# Patient Record
Sex: Male | Born: 1960 | Race: White | Hispanic: No | Marital: Married | Smoking: Former smoker
Health system: Southern US, Community
[De-identification: ages and names within clinical notes are randomized; demographics above are authoritative.]

## PROBLEM LIST (undated history)

## (undated) DIAGNOSIS — Z6841 Body Mass Index (BMI) 40.0 and over, adult: Secondary | ICD-10-CM

## (undated) DIAGNOSIS — K219 Gastro-esophageal reflux disease without esophagitis: Secondary | ICD-10-CM

## (undated) DIAGNOSIS — E119 Type 2 diabetes mellitus without complications: Secondary | ICD-10-CM

## (undated) DIAGNOSIS — D510 Vitamin B12 deficiency anemia due to intrinsic factor deficiency: Secondary | ICD-10-CM

## (undated) DIAGNOSIS — I499 Cardiac arrhythmia, unspecified: Secondary | ICD-10-CM

## (undated) DIAGNOSIS — I4891 Unspecified atrial fibrillation: Secondary | ICD-10-CM

## (undated) DIAGNOSIS — M86272 Subacute osteomyelitis, left ankle and foot: Secondary | ICD-10-CM

## (undated) DIAGNOSIS — J189 Pneumonia, unspecified organism: Secondary | ICD-10-CM

## (undated) DIAGNOSIS — S9305XA Dislocation of left ankle joint, initial encounter: Secondary | ICD-10-CM

## (undated) DIAGNOSIS — L02419 Cutaneous abscess of limb, unspecified: Secondary | ICD-10-CM

## (undated) DIAGNOSIS — S53105A Unspecified dislocation of left ulnohumeral joint, initial encounter: Secondary | ICD-10-CM

## (undated) DIAGNOSIS — G500 Trigeminal neuralgia: Secondary | ICD-10-CM

## (undated) DIAGNOSIS — G894 Chronic pain syndrome: Secondary | ICD-10-CM

## (undated) DIAGNOSIS — K589 Irritable bowel syndrome without diarrhea: Secondary | ICD-10-CM

## (undated) DIAGNOSIS — I4821 Permanent atrial fibrillation: Secondary | ICD-10-CM

## (undated) DIAGNOSIS — M545 Low back pain: Secondary | ICD-10-CM

## (undated) DIAGNOSIS — J45909 Unspecified asthma, uncomplicated: Secondary | ICD-10-CM

## (undated) DIAGNOSIS — E1140 Type 2 diabetes mellitus with diabetic neuropathy, unspecified: Secondary | ICD-10-CM

## (undated) DIAGNOSIS — S91002A Unspecified open wound, left ankle, initial encounter: Secondary | ICD-10-CM

## (undated) DIAGNOSIS — F32A Depression, unspecified: Secondary | ICD-10-CM

## (undated) DIAGNOSIS — F419 Anxiety disorder, unspecified: Secondary | ICD-10-CM

## (undated) DIAGNOSIS — Z91030 Bee allergy status: Secondary | ICD-10-CM

## (undated) DIAGNOSIS — E662 Morbid (severe) obesity with alveolar hypoventilation: Secondary | ICD-10-CM

## (undated) DIAGNOSIS — I509 Heart failure, unspecified: Secondary | ICD-10-CM

## (undated) DIAGNOSIS — Z9109 Other allergy status, other than to drugs and biological substances: Secondary | ICD-10-CM

## (undated) DIAGNOSIS — M79605 Pain in left leg: Secondary | ICD-10-CM

## (undated) DIAGNOSIS — G510 Bell's palsy: Secondary | ICD-10-CM

## (undated) DIAGNOSIS — M1990 Unspecified osteoarthritis, unspecified site: Secondary | ICD-10-CM

## (undated) DIAGNOSIS — G4733 Obstructive sleep apnea (adult) (pediatric): Secondary | ICD-10-CM

## (undated) DIAGNOSIS — I872 Venous insufficiency (chronic) (peripheral): Secondary | ICD-10-CM

## (undated) DIAGNOSIS — E782 Mixed hyperlipidemia: Secondary | ICD-10-CM

## (undated) DIAGNOSIS — I10 Essential (primary) hypertension: Secondary | ICD-10-CM

## (undated) DIAGNOSIS — Z87442 Personal history of urinary calculi: Secondary | ICD-10-CM

## (undated) DIAGNOSIS — R609 Edema, unspecified: Secondary | ICD-10-CM

## (undated) DIAGNOSIS — G542 Cervical root disorders, not elsewhere classified: Secondary | ICD-10-CM

## (undated) DIAGNOSIS — E1142 Type 2 diabetes mellitus with diabetic polyneuropathy: Secondary | ICD-10-CM

## (undated) HISTORY — DX: Subacute osteomyelitis, left ankle and foot: M86.272

## (undated) HISTORY — DX: Morbid (severe) obesity with alveolar hypoventilation: E66.2

## (undated) HISTORY — DX: Unspecified open wound, left ankle, initial encounter: S93.05XA

## (undated) HISTORY — DX: Unspecified osteoarthritis, unspecified site: M19.90

## (undated) HISTORY — DX: Bee allergy status: Z91.030

## (undated) HISTORY — DX: Anxiety disorder, unspecified: F41.9

## (undated) HISTORY — DX: Body Mass Index (BMI) 40.0 and over, adult: Z684

## (undated) HISTORY — DX: Venous insufficiency (chronic) (peripheral): I87.2

## (undated) HISTORY — DX: Mixed hyperlipidemia: E78.2

## (undated) HISTORY — DX: Type 2 diabetes mellitus without complications: E11.9

## (undated) HISTORY — DX: Permanent atrial fibrillation: I48.21

## (undated) HISTORY — DX: Obstructive sleep apnea (adult) (pediatric): G47.33

## (undated) HISTORY — DX: Low back pain: M54.5

## (undated) HISTORY — DX: Dislocation of left ankle joint, initial encounter: S91.002A

## (undated) HISTORY — DX: Essential (primary) hypertension: I10

## (undated) HISTORY — DX: Cutaneous abscess of limb, unspecified: L02.419

## (undated) HISTORY — DX: Other allergy status, other than to drugs and biological substances: Z91.09

## (undated) HISTORY — PX: TONSILLECTOMY: SUR1361

## (undated) HISTORY — DX: Type 2 diabetes mellitus with diabetic neuropathy, unspecified: E11.40

## (undated) HISTORY — PX: BACK SURGERY: SHX140

## (undated) HISTORY — DX: Chronic pain syndrome: G89.4

## (undated) HISTORY — DX: Cervical root disorders, not elsewhere classified: G54.2

## (undated) HISTORY — DX: Type 2 diabetes mellitus with diabetic polyneuropathy: E11.42

## (undated) HISTORY — DX: Pain in left leg: M79.605

## (undated) HISTORY — DX: Vitamin B12 deficiency anemia due to intrinsic factor deficiency: D51.0

## (undated) HISTORY — DX: Trigeminal neuralgia: G50.0

## (undated) HISTORY — DX: Unspecified dislocation of left ulnohumeral joint, initial encounter: S53.105A

## (undated) HISTORY — PX: CARPAL TUNNEL RELEASE: SHX101

## (undated) HISTORY — PX: ADENOIDECTOMY: SUR15

## (undated) HISTORY — DX: Morbid (severe) obesity due to excess calories: E66.01

## (undated) HISTORY — DX: Bell's palsy: G51.0

## (undated) HISTORY — DX: Edema, unspecified: R60.9

---

## 1996-02-05 HISTORY — PX: SHOULDER SURGERY: SHX246

## 2001-01-20 ENCOUNTER — Encounter: Payer: Self-pay | Admitting: Neurosurgery

## 2001-01-20 ENCOUNTER — Encounter: Admission: RE | Admit: 2001-01-20 | Discharge: 2001-01-20 | Payer: Self-pay | Admitting: Neurosurgery

## 2001-02-19 ENCOUNTER — Encounter: Payer: Self-pay | Admitting: Neurosurgery

## 2001-02-19 ENCOUNTER — Inpatient Hospital Stay (HOSPITAL_COMMUNITY): Admission: RE | Admit: 2001-02-19 | Discharge: 2001-02-20 | Payer: Self-pay | Admitting: Neurosurgery

## 2001-04-07 ENCOUNTER — Encounter: Payer: Self-pay | Admitting: Neurosurgery

## 2001-04-07 ENCOUNTER — Ambulatory Visit (HOSPITAL_COMMUNITY): Admission: RE | Admit: 2001-04-07 | Discharge: 2001-04-07 | Payer: Self-pay | Admitting: Neurosurgery

## 2001-05-12 ENCOUNTER — Encounter: Payer: Self-pay | Admitting: Neurosurgery

## 2001-05-12 ENCOUNTER — Encounter: Admission: RE | Admit: 2001-05-12 | Discharge: 2001-05-12 | Payer: Self-pay | Admitting: Neurosurgery

## 2001-07-15 ENCOUNTER — Encounter: Payer: Self-pay | Admitting: Neurosurgery

## 2001-07-15 ENCOUNTER — Ambulatory Visit (HOSPITAL_COMMUNITY): Admission: RE | Admit: 2001-07-15 | Discharge: 2001-07-15 | Payer: Self-pay | Admitting: Neurosurgery

## 2002-05-28 ENCOUNTER — Encounter
Admission: RE | Admit: 2002-05-28 | Discharge: 2002-05-28 | Payer: Self-pay | Admitting: Physical Medicine and Rehabilitation

## 2002-08-25 ENCOUNTER — Encounter: Payer: Self-pay | Admitting: Neurosurgery

## 2002-08-27 ENCOUNTER — Inpatient Hospital Stay (HOSPITAL_COMMUNITY): Admission: RE | Admit: 2002-08-27 | Discharge: 2002-08-28 | Payer: Self-pay | Admitting: Neurosurgery

## 2002-08-27 ENCOUNTER — Encounter: Payer: Self-pay | Admitting: Neurosurgery

## 2002-10-13 ENCOUNTER — Encounter: Admission: RE | Admit: 2002-10-13 | Discharge: 2002-10-13 | Payer: Self-pay | Admitting: Neurosurgery

## 2002-10-13 ENCOUNTER — Encounter: Payer: Self-pay | Admitting: Neurosurgery

## 2002-11-29 ENCOUNTER — Encounter: Admission: RE | Admit: 2002-11-29 | Discharge: 2002-11-29 | Payer: Self-pay | Admitting: Neurosurgery

## 2002-11-29 ENCOUNTER — Encounter: Payer: Self-pay | Admitting: Neurosurgery

## 2003-02-05 HISTORY — PX: KNEE SURGERY: SHX244

## 2007-03-02 ENCOUNTER — Ambulatory Visit (HOSPITAL_COMMUNITY)
Admission: RE | Admit: 2007-03-02 | Discharge: 2007-03-02 | Payer: Self-pay | Admitting: Physical Medicine and Rehabilitation

## 2007-03-02 ENCOUNTER — Encounter (INDEPENDENT_AMBULATORY_CARE_PROVIDER_SITE_OTHER): Payer: Self-pay | Admitting: Physical Medicine and Rehabilitation

## 2007-06-30 ENCOUNTER — Ambulatory Visit (HOSPITAL_BASED_OUTPATIENT_CLINIC_OR_DEPARTMENT_OTHER): Admission: RE | Admit: 2007-06-30 | Discharge: 2007-06-30 | Payer: Self-pay | Admitting: Neurosurgery

## 2007-07-30 ENCOUNTER — Ambulatory Visit (HOSPITAL_BASED_OUTPATIENT_CLINIC_OR_DEPARTMENT_OTHER): Admission: RE | Admit: 2007-07-30 | Discharge: 2007-07-30 | Payer: Self-pay | Admitting: Neurosurgery

## 2007-08-16 ENCOUNTER — Encounter: Admission: RE | Admit: 2007-08-16 | Discharge: 2007-08-16 | Payer: Self-pay | Admitting: Neurology

## 2008-01-25 ENCOUNTER — Encounter
Admission: RE | Admit: 2008-01-25 | Discharge: 2008-01-25 | Payer: Self-pay | Admitting: Physical Medicine and Rehabilitation

## 2010-06-19 NOTE — Op Note (Signed)
NAMECOLLAN, SCHOENFELD                ACCOUNT NO.:  [id number]   MEDICAL RECORD NO.:  [id number]          PATIENT TYPE:  AMB   LOCATION:  DSC                          FACILITY:  [facility]   PHYSICIAN:  Reinaldo Meeker, M.D. DATE OF BIRTH:  07/10/1960   DATE OF PROCEDURE:  06/30/2007  DATE OF DISCHARGE:                               OPERATIVE REPORT   PREOPERATIVE DIAGNOSIS:  Right carpal tunnel syndrome.   POSTOPERATIVE DIAGNOSIS:  Right carpal tunnel syndrome.   PROCEDURE:  Right carpal tunnel release.   SURGEON:  Reinaldo Meeker, MD   ANESTHESIA:  Regional.   PROCEDURE IN DETAIL:  After induction of regional anesthetic, the  patient's right hand, wrist, and forearm were prepped and draped in  usual sterile fashion.  A curvilinear incision was made in the right  palm, started at the wrist and along with the ring finger heading up  into the palm and then in a radial direction.  Subcutaneous fat was  incised and a self-retaining retractor was placed for exposure.  Dissection was carried down to the transverse carpal ligament, which was  easily identifiable.  This was then sectioned from a proximal to distal  direction, decompressed the underlying nerve.  The inspection was then  carried out once more for any evidence of residual compression, none  could be identified.  Irrigation was then carried out, and the wound  closed with interrupted Vicryl on the subcutaneous tissue and  interrupted nylon on the skin.  A bulky dressing was then applied and  the patient then taken to recovery room in stable condition.           ______________________________  Reinaldo Meeker, M.D.     ROK/MEDQ  D:  06/30/2007  T:  06/30/2007  Job:  [id number]

## 2010-06-19 NOTE — Op Note (Signed)
NAMEHANNAH, CRILL                ACCOUNT NO.:  [id number]   MEDICAL RECORD NO.:  [id number]          PATIENT TYPE:  AMB   LOCATION:  DSC                          FACILITY:  [facility]   PHYSICIAN:  Reinaldo Meeker, M.D. DATE OF BIRTH:  1960/05/27   DATE OF PROCEDURE:  07/30/2007  DATE OF DISCHARGE:                               OPERATIVE REPORT   PREOPERATIVE DIAGNOSIS:  Left carpal tunnel syndrome.   POSTOPERATIVE DIAGNOSIS:  Left carpal tunnel syndrome.   PROCEDURE:  Left carpal tunnel release.   SURGEON:  Reinaldo Meeker, MD.   PROCEDURE IN DETAIL:  After induction of regional anesthetic, the  patient's forearm, wrist, and hand were prepped and draped in the usual  sterile fashion.  Curvilinear incision was made in the left palm  starting at the wrist in line with the ring finger heading up into the  palm and then heading to slightly radial direction.  Subcutaneous fat  was divided and self-retaining retractor was placed for exposure.  The  transverse carpal ligament was found to be tremendously hypertrophied  and this was incised from a proximal to a distal direction to decompress  the underlying median nerve, which was well visualized.  A thorough  decompression was carried out into the distal wrist and up into the mid  palm.  At this time, inspection was carried out in all directions for  any evidence of residual compression, none could be identified.  Irrigation was carried out and the wound closed with interrupted Vicryl  in the subcutaneous tissue and interrupted nylon on the skin.  A bulky  sterile dressing was applied on the Ace wrap and the patient was taken  to recovery room in stable condition.           ______________________________  Reinaldo Meeker, M.D.     ROK/MEDQ  D:  07/30/2007  T:  07/30/2007  Job:  [id number]

## 2010-06-22 NOTE — Op Note (Signed)
[place]  Patient:    ZACHARIE, PORTNER Visit Number: [id number] MRN: [id number]          Service Type: DSU Location: [id number] [phone] Attending Physician:  Gerald Dexter Dictated by:   Reinaldo Meeker, M.D. Proc. Date: 02/19/01 Admit Date:  02/19/2001                             Operative Report  PREOPERATIVE DIAGNOSIS:  Herniated disk at L4-5 central.  POSTOPERATIVE DIAGNOSIS:  Herniated disk at L4-5 central.  PROCEDURES: 1. Bilateral L4-5 laminectomy, followed by bilateral microdiskectomy, followed    by Ray Cage interbody fusion with local bone graft. 2. Microdissection L4-5 disk and L5 nerve roots bilaterally.  SURGEON:  Reinaldo Meeker, M.D.  ASSISTANT:  Donalee Citrin, Montez Hageman., M.D.  DESCRIPTION OF PROCEDURE:  After being placed in the prone position, the patients back was prepped and draped in the usual sterile fashion. Fluoroscopy was used prior to incision to identify the appropriate level.  A midline incision made above the spinous processes of L4 and L5.  Using Bovie cutting current, the incision was carried down to the spinous processes. Subperiosteal dissection was then carried out bilaterally on the spinous processes and laminae, and the McCullough self-retaining retractor was placed for exposure.  Fluoroscopy confirmed approach to the appropriate levels.  The spinous processes and interspinous ligament were removed with the Stille rongeur.  Starting on the patients left side, previous laminotomy was identified and enlarged by removing the inferior two-thirds of the L4 lamina and the medial two-thirds of the facet joint and the superior edge of the L5 lamina, which was generally already removed.  Residual bone was removed and saved for use in the cages at the end of the case, and ligamentum flavum and scar tissue were removed in a piecemeal fashion.  Similar decompression was carried out on the patients right side, once again  removing the inferior two-thirds of the L4 lamina, the medial two-thirds of the facet joint, and the superior one-half of the L5 segment.  Once again residual bone was removed and saved for use in the cages.  Ligamentum flavum and midline structures were removed to complete the bilateral decompression.  Nerve roots were identified and followed out their foramina bilaterally until decompression was confirmed. At this point the microscope was draped and brought in the field and used for the remainder of the case.  Using microdissection technique, the lateral aspect of the thecal sac and L5 nerve root was identified on the right.  Using bipolar coagulation, the annulus of the disk was incised and using pituitary rongeurs and curettes, it was thoroughly cleaned out.  Similar decompression was carried out on the patients left side, once again identifying the L5 nerve root, the disk space incised with the annulus and cleaning it thoroughly with the pituitary rongeurs and curettes.  At this point the disk was found to be well cleaned out.  Ray Cages 14 x 26 mm were then chosen.  Under fluoroscopic guidance starting on the patients right side, the Tang retractor was placed, followed by cutting, tapping, and placement of the 14 x 6 Ray Cage into excellent position.  The Tang retractor was removed.  There was no evidence of any injury to the nerve root on that side.  A similar procedure was carried out on the patients left side, once again placing the Tang retractor, cutting, tapping, placing the cage  under fluoroscopic guidance without difficulty.  Fluoroscopy in the AP and lateral position showed the cages to be in excellent position.  Large amounts of irrigation were carried out at this time.  Any bleeding was controlled with bipolar coagulation.  Bone from the laminotomy was packed within the cages and the caps placed without difficulty.  The wound was irrigated once more and any bleeding  controlled with bipolar coagulation and Gelfoam.  A medium Hemovac drain was brought in through a separate stab wound incision and secured to the skin with a Vicryl stitch.  The wound was then closed using interrupted Vicryl in the muscle, fascia, subcutaneous, and subcuticular tissues, and Dermabond and Steri-Strips on the skin.  A sterile dressing was then applied, and the patient was extubated and taken to the recovery room in stable condition. Dictated by:   Reinaldo Meeker, M.D. Attending Physician:  Gerald Dexter DD:  02/19/01 TD:  02/19/01 Job: [id number]

## 2010-06-22 NOTE — Op Note (Signed)
David Fitzgerald, David Fitzgerald                            ACCOUNT NO.:  [id number]   MEDICAL RECORD NO.:  [id number]                   PATIENT TYPE:  INP   LOCATION:  3005                                 FACILITY:  [facility]   PHYSICIAN:  Reinaldo Meeker, M.D.              DATE OF BIRTH:  10/26/1960   DATE OF PROCEDURE:  08/27/2002  DATE OF DISCHARGE:                                 OPERATIVE REPORT   PREOPERATIVE DIAGNOSIS:  Herniated disk and degenerative disk disease L5-S1.   POSTOPERATIVE DIAGNOSIS:  Herniated disk and degenerative disk disease L5-  S1.   PROCEDURE:  Bilateral L5-S1 decompressive laminectomy followed  by posterior  lumbar interbody fusion with Tangent bone spacers and autologous bone graft.   SURGEON:  Reinaldo Meeker, M.D.   ASSISTANT:  Kathaleen Maser. Pool, M.D.   DESCRIPTION OF PROCEDURE:  After being placed in the prone position  the  patient's back was prepped and draped in the usual sterile fashion. The  previous lumbar incision was opened and was carried down to the spinous  processes of L3 and S1. Starting at S1, the spinous processes were  identified  and the subperiosteal dissection was then carried out  bilaterally  on the spinous processes lamina of S1 and along the facet  joint.   The previous laminotomy defect was identified and the facet joint  of L5 and  L4 were identified  as well. Any residual lamina on the right at L5 was  identified  as well. A self-retaining retractor was placed for exposure. X-  rays showed approach to the appropriate levels.   Starting at the patient's right side, a laminotomy was performed by removing  most of the residual lamina of L5 and the superior 1/2 of the S1 lamina. The  residual  bone was removed and saved for use later in the case. The ligament  of Flavum and scar tissue were removed in a piecemeal fashion to identify  the  thecal sac and the S1 nerve root. A similar procedure was then carried  on the patient's left  side.   The previous laminotomy was identified and enlarged until the bilateral  decompression was completed. At this point bilateral microdiskectomy was  carried out. Once the diskectomy had been performed bilaterally, a 10-mm  distractor was placed on the patient's left. A posterior lumbar interbody  fusion was then performed on the patient's right side.   Under fluoroscopic guidance scraping followed by chiseling the 10 x 9 mm  chisel was carried out. A 10 x 25 mm bone plug was placed and fluoroscopy  showed it to be in good position. A similar procedure was then carried out  on the patient's left side. Prior to placing the 2nd bone spacer, autologous  bone graft was placed in the midline.   Final fluoroscopy showed both plugs to be in good position. Large amounts of  irrigation  were carried out. At this time Dr. Jordan Likes performed a posterior  lumbar interbody fusion with pedicle screw fixation. When that was completed  large amounts of irrigation were carried out. Any bleeding was controlled  with bipolar coagulation and Gelfoam.   A Hemovac drain was brought through a separate stab wound incision and out  through the epidural space. The wound was then closed  using interrupted  Vicryl in the muscle fascia and subcuticular tissue and staples on the skin.  A sterile dressing was then applied.  The patient was extubated and was taken to the recovery room in stable  condition.                                               Reinaldo Meeker, M.D.    ROK/MEDQ  D:  08/27/2002  T:  08/27/2002  Job:  [id number]

## 2010-06-22 NOTE — Op Note (Signed)
NAMEGASTON, [name]                            ACCOUNT NO.:  [id number]   MEDICAL RECORD NO.:  [id number]                   PATIENT TYPE:  INP   LOCATION:                                       FACILITY:  [facility]   PHYSICIAN:  Henry A. Pool, M.D.                 DATE OF BIRTH:  01-27-1961   DATE OF PROCEDURE:  08/27/2002  DATE OF DISCHARGE:                                 OPERATIVE REPORT   PREOPERATIVE DIAGNOSIS:  Discogenic L5-S1 pain with severe degenerative disk  disease.   POSTOPERATIVE DIAGNOSIS:  Discogenic L5-S1 pain with severe degenerative  disk disease.   PROCEDURE:  Posterolateral fusion at L5-S1 utilizing pedicle screw fixation  and local autografting.   SURGEON:  Kathaleen Maser. Pool, M.D.   ASSISTANT:  Reinaldo Meeker, M.D.   ANESTHESIA:  General endotracheal.   INDICATIONS:  [name] is a 50 year old patient of Dr. Gerlene Fee.  He was  evaluated and found to have significant degenerative disk disease at the L5-  S1 level with marked lumbar pain, failing all conservative management.  He  presents now for interbody fusion.  Dr. Gerlene Fee has asked me to augment this  with posterolateral fusion utilizing pedicle screw instrumentation and local  autografting.   OPERATIVE NOTE:  After Dr. Gerlene Fee performed a complete laminectomy of L5  and bilateral L5 and S1 foraminotomies, I was asked to perform exposure for  posterolateral fusion.  The transverse processes of L5 and the sacral ala  were dissected free bilaterally.  The deep self-retaining retractor was  repositioned.  Dr. Gerlene Fee then performed interbody fusion utilizing Tangent  wedges and local autografting.  The pedicles at L5 and S1 were then isolated  using surface landmarks and intraoperative fluoroscopy.  Each pedicle was  then probed using a pedicle awl.  Each pedicle awl track was found to be  solidly within bone.  Each pedicle awl track was then tapped with a 5.25  screw tap.  Each screw tap hole was found to be  solidly within bone.  Screws  6.75 x 40 mm were placed bilaterally at L5, a 6.75 x 40 mm screw was placed  on the left at S1 and a 6.75 x 35 mm screw was placed on the right at S1.  A  short segment of titanium rod was then contoured and placed over the screw  heads.  Locking caps were then engaged.  Final tightening was then made  placing the construct under compression.  The transverse processes and the  sacral alae were then decorticated using the high-speed drill.  Morcellized  autograft was packed posterolaterally.  The wound was then irrigated with  antibiotic solution.  The spinal canal was inspected.  It was then closed in  typical fashion by Dr. Gerlene Fee.  The patient tolerated the procedure well,  and he returns to the recovery room postop.  Henry A. Pool, M.D.    HAP/MEDQ  D:  08/27/2002  T:  08/27/2002  Job:  [id number]

## 2010-10-31 LAB — POCT I-STAT, CHEM 8
Calcium, Ion: 1.08 — ABNORMAL LOW
Glucose, Bld: 112 — ABNORMAL HIGH
HCT: 40
TCO2: 26

## 2010-11-01 LAB — BASIC METABOLIC PANEL
BUN: 11
Calcium: 8.9
Creatinine, Ser: 0.92
GFR calc Af Amer: >60

## 2010-11-01 LAB — POCT HEMOGLOBIN-HEMACUE: Hemoglobin: 12.6 — ABNORMAL LOW

## 2012-07-09 DIAGNOSIS — G894 Chronic pain syndrome: Secondary | ICD-10-CM

## 2012-07-09 DIAGNOSIS — Z6841 Body Mass Index (BMI) 40.0 and over, adult: Secondary | ICD-10-CM

## 2012-07-09 DIAGNOSIS — M79605 Pain in left leg: Secondary | ICD-10-CM | POA: Insufficient documentation

## 2012-07-09 DIAGNOSIS — M545 Low back pain, unspecified: Secondary | ICD-10-CM

## 2012-07-09 HISTORY — DX: Chronic pain syndrome: G89.4

## 2012-07-09 HISTORY — DX: Body Mass Index (BMI) 40.0 and over, adult: Z684

## 2012-07-09 HISTORY — DX: Morbid (severe) obesity due to excess calories: E66.01

## 2012-07-09 HISTORY — DX: Low back pain, unspecified: M54.50

## 2013-02-24 DIAGNOSIS — G542 Cervical root disorders, not elsewhere classified: Secondary | ICD-10-CM | POA: Insufficient documentation

## 2013-02-24 DIAGNOSIS — E1142 Type 2 diabetes mellitus with diabetic polyneuropathy: Secondary | ICD-10-CM

## 2013-02-24 DIAGNOSIS — G500 Trigeminal neuralgia: Secondary | ICD-10-CM | POA: Insufficient documentation

## 2013-02-24 DIAGNOSIS — G510 Bell's palsy: Secondary | ICD-10-CM

## 2013-02-24 HISTORY — DX: Type 2 diabetes mellitus with diabetic polyneuropathy: E11.42

## 2013-02-24 HISTORY — DX: Cervical root disorders, not elsewhere classified: G54.2

## 2013-02-24 HISTORY — DX: Trigeminal neuralgia: G50.0

## 2013-02-24 HISTORY — DX: Bell's palsy: G51.0

## 2014-11-14 ENCOUNTER — Ambulatory Visit (INDEPENDENT_AMBULATORY_CARE_PROVIDER_SITE_OTHER): Payer: Medicare Other | Admitting: *Deleted

## 2014-11-14 ENCOUNTER — Ambulatory Visit: Payer: Self-pay

## 2014-11-14 DIAGNOSIS — T63441D Toxic effect of venom of bees, accidental (unintentional), subsequent encounter: Secondary | ICD-10-CM | POA: Diagnosis not present

## 2014-11-14 NOTE — Progress Notes (Signed)
Pt. Started venom injections today-Mixed Vespid 0.003 and Wasp 0.001 at 0.05 weekly to follow build up schedule. Instructions/information reviewed, consent signed and confirmed pt has Epipen and understands how to use.

## 2014-11-16 ENCOUNTER — Other Ambulatory Visit: Payer: Self-pay | Admitting: Allergy and Immunology

## 2014-11-16 DIAGNOSIS — T63441D Toxic effect of venom of bees, accidental (unintentional), subsequent encounter: Secondary | ICD-10-CM

## 2014-11-21 ENCOUNTER — Ambulatory Visit (INDEPENDENT_AMBULATORY_CARE_PROVIDER_SITE_OTHER): Payer: Medicare Other | Admitting: *Deleted

## 2014-11-21 DIAGNOSIS — T63441D Toxic effect of venom of bees, accidental (unintentional), subsequent encounter: Secondary | ICD-10-CM

## 2014-11-28 ENCOUNTER — Encounter: Payer: Medicare Other | Admitting: *Deleted

## 2014-11-28 NOTE — Progress Notes (Signed)
This encounter was created in error - please disregard.

## 2014-12-01 ENCOUNTER — Ambulatory Visit (INDEPENDENT_AMBULATORY_CARE_PROVIDER_SITE_OTHER): Payer: Medicare Other

## 2014-12-01 DIAGNOSIS — IMO0001 Reserved for inherently not codable concepts without codable children: Secondary | ICD-10-CM

## 2014-12-01 DIAGNOSIS — T63441A Toxic effect of venom of bees, accidental (unintentional), initial encounter: Secondary | ICD-10-CM | POA: Diagnosis not present

## 2014-12-01 DIAGNOSIS — T63484D Toxic effect of venom of other arthropod, undetermined, subsequent encounter: Secondary | ICD-10-CM

## 2014-12-02 DIAGNOSIS — T63441A Toxic effect of venom of bees, accidental (unintentional), initial encounter: Secondary | ICD-10-CM | POA: Diagnosis not present

## 2014-12-08 ENCOUNTER — Ambulatory Visit (INDEPENDENT_AMBULATORY_CARE_PROVIDER_SITE_OTHER): Payer: Medicare Other | Admitting: *Deleted

## 2014-12-08 DIAGNOSIS — T63441D Toxic effect of venom of bees, accidental (unintentional), subsequent encounter: Secondary | ICD-10-CM

## 2014-12-14 ENCOUNTER — Ambulatory Visit (INDEPENDENT_AMBULATORY_CARE_PROVIDER_SITE_OTHER): Payer: Medicare Other | Admitting: Pulmonary Disease

## 2014-12-14 ENCOUNTER — Encounter: Payer: Self-pay | Admitting: Pulmonary Disease

## 2014-12-14 VITALS — BP 140/92 | HR 73 | Temp 97.8°F | Ht 74.0 in | Wt 387.4 lb

## 2014-12-14 DIAGNOSIS — E1140 Type 2 diabetes mellitus with diabetic neuropathy, unspecified: Secondary | ICD-10-CM

## 2014-12-14 DIAGNOSIS — I10 Essential (primary) hypertension: Secondary | ICD-10-CM | POA: Diagnosis not present

## 2014-12-14 DIAGNOSIS — I119 Hypertensive heart disease without heart failure: Secondary | ICD-10-CM | POA: Insufficient documentation

## 2014-12-14 DIAGNOSIS — E782 Mixed hyperlipidemia: Secondary | ICD-10-CM | POA: Diagnosis not present

## 2014-12-14 DIAGNOSIS — E662 Morbid (severe) obesity with alveolar hypoventilation: Secondary | ICD-10-CM

## 2014-12-14 DIAGNOSIS — I872 Venous insufficiency (chronic) (peripheral): Secondary | ICD-10-CM

## 2014-12-14 DIAGNOSIS — R609 Edema, unspecified: Secondary | ICD-10-CM

## 2014-12-14 DIAGNOSIS — G4733 Obstructive sleep apnea (adult) (pediatric): Secondary | ICD-10-CM

## 2014-12-14 DIAGNOSIS — Z6841 Body Mass Index (BMI) 40.0 and over, adult: Secondary | ICD-10-CM

## 2014-12-14 HISTORY — DX: Hypertensive heart disease without heart failure: I11.9

## 2014-12-14 HISTORY — DX: Obstructive sleep apnea (adult) (pediatric): G47.33

## 2014-12-14 HISTORY — DX: Venous insufficiency (chronic) (peripheral): I87.2

## 2014-12-14 HISTORY — DX: Morbid (severe) obesity with alveolar hypoventilation: E66.2

## 2014-12-14 HISTORY — DX: Morbid (severe) obesity due to excess calories: E66.01

## 2014-12-14 HISTORY — DX: Type 2 diabetes mellitus with diabetic neuropathy, unspecified: E11.40

## 2014-12-14 HISTORY — DX: Edema, unspecified: R60.9

## 2014-12-14 HISTORY — DX: Mixed hyperlipidemia: E78.2

## 2014-12-14 NOTE — Progress Notes (Addendum)
Subjective:     Patient ID: David Fitzgerald, male   DOB: [birth year], 54 y.o.   MRN: [id number]  HPI ~  December 14, 2014:  Initial Sleep Consult by SN>        47 y/o WM, pt of David Fitzgerald in [place], referred for a Sleep consult...  The pt relates a hx of previously being evaluated by [facility] in [place] (about 4 yrs ago) w/ a sleep study done in their office; he does not know the results but remebers that "it showed something in REM sleep"; he was rec to start BiPAP but there was a prob w/ Medicare coverage, then a download problem, & the [facility] (?who) picked up the machine... About 2 yrs ago he developed Bell's Palsy & saw David Fitzgerald, Neurology in [place]; he noted the pt's hx of OSA & ordered another Sleep Study- this one done at [facility] "I needed BiPAP" & it was ordered from [facility] in [place] (?settings but pt recalls tolerating it OK in the beginning);  He subsequently gained over 100# & noted the pressure was too high & "blew the mask off my face mult times per night;  He apparently did not ret to David Fitzgerald & instead contacted the DME but states that they were of no help... He has not used his BiPAP in several months he says & has had return of all his OSA symptoms- not resting well, wakes tired, falls asleep easily during the day, drowsy during mult situations during the day & has an ESS= 21/24...   He is married & pt & wife sleep in the same bed; he claims that she does NOT c/o snoring, observed apneas, restlessness, leg movements, etc... He goes to bed ~MN, falls asleep in 60mn w/o needing meds, awakens every 2-3H during the night (bathroom etc), wakes tired at no spefific schedule, denies throat symptoms but does note dry mouth;  He denies parasomnias (no talking, sleepwalking, teeth grinding, dreams etc;  No hx narcolepsy, cataplexy, or hypnogogic hallucinations; he has a periph neuropathy but no RLS reported...   Smoking Hx>  Former heavy smoker, smoked for  35 yrs up to 3ppd, quit in 2008  Pulmonary Hx>  Hx allergies on Claritin, no hx Asthma, never evaluated or treated for COPD despite is remarkable smoking hx, known OSA, OHS, morbid obesity, huge thick neck... Hx bee sting allergy- David Fitzgerald.  Medical Hx>  Hx HBP on Metop25Bid, Losar50, Lisin40, Lasix40; states neg cath in [place] in 2015; HL on Simva40; DM on Metform; Neuropathy on Gabapentin; DJD, chronic pain syndrome from Back surgeries on Percocet; PA...   Family Hx>  Non-contrib  Occup Hx>  Disabled due to LBP & 3 surgeries  Current Meds>  On ASA, Metop25Bid, Losar50, Lisin40, Lasix40, Simva40, Metform1000Qam, Neurontin300-2Tid, Percocet7.5Tid...  EXAM shows Afeb, VSS w/ BP=140/92; Wt=387#, 74"Tall, BMI=49-50;  Heent- mallampati3, huge fat neck= 21=24";  Chest- decrBS bilat w/o w/r/r;  Heart- RR w/o m/r/g;  Abd- huge, soft, nontender;  Ext- VI, stasis changes 1-2+edema...   CXR> we do not have prev CXR results  PFT> we do not have prev PFT results  [facility]  [facility]=23/19 w/ RDI=0, O2sat mean=95% and nadir=92%, no snoring, no PLMs...  [place] [facility] showed CBC- wnl;  Chems- wnl x BS=148, A1c=6.6;  TSH=0.79  IMP/PLAN>>  David Fitzgerald morbidly obese &  this has severely impacted his life to the point that he is seriously at risk due to thi problem; in this regard he needs to seriously consider Bariatric Surg as a life-saving procedure & he is referred to [facility] [phone] to initiate this process;  He has severe OSA/ OHS by Sleep eval from David Fitzgerald in [place] & I don't understand what fell through the cracks w/ his treatment- we will contact the DME [facility] in [place] to see about new supplies and resetting his BiPAP machine (we have also asked for their data- machine type, settings, downloads);  We have ordered another Sleep  Polysomnography due to the fact that he has gained >100lbs since his last study by his hx & hopefully this can be expedited & done as a split night procedure;  Then we will try to get him seen by our Sleep physician team here at [facility]... We will have him ret for an additional pulmonary eval w/ CXR/ PFTs/ etc...   ADDENDUM>>  Pt had Sleep Study 12/21/14, read by David Fitzgerald>  Severe OSA w/ AHI=52/hr (he never achieved REM sleep), no signif central apneas, mod O2desat (min=80%), mod snoring, no arrhythmias, mild PLMS w/o arousals;  He is being set-up for a CPAP/BiPAP/O2 titration study...  He was also contacted by [facility] office in [place]> they ajusted his old BiPAP machine to 20/16 + new mask & tubing & he tells me that this is much better, tolerating well now & he's used it nightly for the last several days;  I have asked him to proceed w/ the CPAP/BiPAP titration to get his optimal pressures...    No past medical history on file.  Hx bee sting allergy-- David Fitzgerald HBP on Metop25Bid, Losar50, Lisin40, Lasix40... NEG cath in [place] in 2015 per pt. HL on Simva40 DM on Metform Neuropathy on Gabapentin DJD Chronic pain syndrome from Back surgeries on Percocet ?HxPA-- on B12 injections monthly   No past surgical history on file. Hx 3 back operations for LBP:    1st was 1991 in [place] by David Fitzgerald    2nd was 2002 by David Fitzgerald- fusion & Ray cage    3rd was 2003 by David Fitzgerald- fusion w/ plates and screws Bilat CTS surg Arthroscopic surg left shoulder & left knee   Outpatient Encounter Prescriptions as of 12/14/2014  Medication Sig  . aspirin EC 325 MG tablet 325 mg.  . DiphenhydrAMINE HCl (BENADRYL ALLERGY PO) Take by mouth as needed.  Marland Kitchen EPINEPHrine (EPIPEN 2-PAK) 0.3 mg/0.3 mL IJ SOAJ injection Inject 0.3 mg into the muscle once.  . famotidine (PEPCID) 10 MG tablet Take 10 mg by mouth daily.  . furosemide (LASIX) 40 MG tablet Take 40 mg by mouth.  . gabapentin (NEURONTIN) 300 MG capsule Take 600 mg  by mouth 3 (three) times daily.   Marland Kitchen lisinopril (PRINIVIL,ZESTRIL) 40 MG tablet Take 40 mg by mouth daily.  Marland Kitchen loratadine (CLARITIN) 10 MG tablet Take 10 mg by mouth daily.  Marland Kitchen losartan (COZAAR) 50 MG tablet Take 50 mg by mouth daily.  . metFORMIN (GLUCOPHAGE) 1000 MG tablet Take 1,000 mg by mouth daily with breakfast.   . metoprolol tartrate (LOPRESSOR) 25 mg/10 mL SUSP Take 25 mg by mouth 2 (two) times daily.  . nitroGLYCERIN (NITROSTAT) 0.4 MG SL tablet   . oxyCODONE-acetaminophen (PERCOCET) 7.5-325 MG per tablet Take 1 tablet by mouth every 8 (eight) hours as needed for severe pain.  . simvastatin (ZOCOR) 40 MG tablet Take 40 mg by mouth daily.  Marland Kitchen aspirin 81  MG tablet Take 81 mg by mouth daily.   No facility-administered encounter medications on file as of 12/14/2014.    Allergies  Allergen Reactions  . Penicillins     No family history on file.   Social History   Social History  . Marital Status: Married    Spouse Name: N/A  . Number of Children: N/A  . Years of Education: N/A   Occupational History  . Not on file.   Social History Main Topics  . Smoking status: Former Smoker -- 3.00 packs/day for 35 years    Types: Cigarettes    Quit date: 04/13/2006  . Smokeless tobacco: Not on file  . Alcohol Use: Not on file  . Drug Use: Not on file  . Sexual Activity: Not on file   Other Topics Concern  . Not on file   Social History Narrative  . No narrative on file    Current Medications, Allergies, Past Medical History, Past Surgical History, Family History, and Social History were reviewed in [employer] record.   Review of Systems             All symptoms NEG except where BOLDED >>  Constitutional:  F/C/S, fatigue, anorexia, unexpected weight change (up 100+lbs over the last 1-103yr) HEENT:  HA, visual changes, hearing loss, earache, nasal symptoms, sore throat, mouth sores, hoarseness. Resp:  cough, sputum, hemoptysis; SOB, tightness,  wheezing. Cardio:  CP, palpit, DOE, orthopnea, edema. GI:  N/V/D/C, blood in stool; reflux, abd pain, distention, gas. GU:  dysuria, freq, urgency, hematuria, flank pain, voiding difficulty. MS:  joint pain, swelling, tenderness, decr ROM; neck pain, back pain, etc. Neuro:  HA, tremors, seizures, dizziness, syncope, weakness, numbness, gait abn. Skin:  suspicious lesions or skin rash. Heme:  adenopathy, bruising, bleeding. Psyche:  confusion, agitation, sleep disturbance, hallucinations, anxiety, depression suicidal.   Objective:   Physical Exam       Vital Signs:  Reviewed...  General:  WD, morbidly obese, 54y/o WM in NAD; alert & oriented; pleasant & cooperative... HEENT:  /AT; Conjunctiva- pink, Sclera- nonicteric, EOM-wnl, PERRLA, EACs-debris, TMs-wnl; NOSE-congested; THROAT- mallampati3, sl red. Neck:  Extreme obese neck measures 21-24" w/ decrROM; no JVD; normal carotid impulses w/o bruits; no thyromegaly or nodules palpated; no lymphadenopathy. Chest:  decr BS bilat w/ scat rhonchi, no wheezing, no rales, no consolidation... Heart:  Regular Rhythm; norm S1 & S2 without murmurs, rubs, or gallops detected. Abdomen:  Markedly obese, soft & nontender- no guarding or rebound; normal bowel sounds; no organomegaly or masses palpated. Ext:  decrROM; without deformities +arthritic changes; no varicose veins, +venous insuffic, 1-2+ edema;  Pulses intact w/o bruits. Neuro:  No focal neuro deficits; + periph neuropathy; gait abnormal- multifactorial... Derm:  No lesions noted; no rash etc. Lymph:  No cervical, supraclavicular, axillary, or inguinal adenopathy palpated.   Assessment:      IMP >>     Extreme Obesity w/ short fat neck measuring 21-24"> this is a life threatening contition in my opinion & he needs Bariatric Surgery as a potential life saving procedure...    OSA/ OHS/ on BiPAP prev from David Fitzgerald> he has gained >100# since his last assessment by David Fitzgerald; we will  attempt to get his BiPAP adjusted & sched a f/u Sleep study/ BiPAP titration...    Suspect underlying COPD, ex-smoker w/ signif past smoking hx> needs formal evaluation w/ CXR, FullPFTs, etc at a future f/u visit...     Medical problems per DrRedding:  HBP    VI w/ edema    Hyperlipidemia    DM w/ neuropathy    LBP, on disability, s/p 3 operations    Chronic pain syndrome     PLAN >>     David Fitzgerald is morbidly obese & this has severely impacted his life to the point that he is seriously at risk due to thi problem; in this regard he needs to seriously consider Bariatric Surg as a life-saving procedure & he is referred to [facility] [phone] to initiate this process;  He has severe OSA/ OHS by Sleep eval from David Fitzgerald in [place] & I don't understand what fell through the cracks w/ his treatment- we will contact the DME [facility] in [place] to see about new supplies and resetting his BiPAP machine (we have also asked for their data- machine type, settings, downloads);  We have ordered another Sleep Polysomnography due to the fact that he has gained >100lbs since his last study by his hx & hopefully this can be expedited & done as a split night procedure;  Then we will try to get him seen by our Sleep physician team here at [facility]... We will have him ret for an additional pulmonary eval w/ CXR/ PFTs/ etc     Plan:     Patient's Medications  New Prescriptions   No medications on file  Previous Medications   ASPIRIN 81 MG TABLET    Take 81 mg by mouth daily.   ASPIRIN EC 325 MG TABLET    325 mg.   DIPHENHYDRAMINE HCL (BENADRYL ALLERGY PO)    Take by mouth as needed.   EPINEPHRINE (EPIPEN 2-PAK) 0.3 MG/0.3 ML IJ SOAJ INJECTION    Inject 0.3 mg into the muscle once.   FAMOTIDINE (PEPCID) 10 MG TABLET    Take 10 mg by mouth daily.   FUROSEMIDE (LASIX) 40 MG TABLET    Take 40 mg by mouth.   GABAPENTIN (NEURONTIN) 300 MG CAPSULE    Take 600 mg by mouth 3 (three) times daily.    LISINOPRIL  (PRINIVIL,ZESTRIL) 40 MG TABLET    Take 40 mg by mouth daily.   LORATADINE (CLARITIN) 10 MG TABLET    Take 10 mg by mouth daily.   LOSARTAN (COZAAR) 50 MG TABLET    Take 50 mg by mouth daily.   METFORMIN (GLUCOPHAGE) 1000 MG TABLET    Take 1,000 mg by mouth daily with breakfast.    METOPROLOL TARTRATE (LOPRESSOR) 25 MG/10 ML SUSP    Take 25 mg by mouth 2 (two) times daily.   NITROGLYCERIN (NITROSTAT) 0.4 MG SL TABLET       OXYCODONE-ACETAMINOPHEN (PERCOCET) 7.5-325 MG PER TABLET    Take 1 tablet by mouth every 8 (eight) hours as needed for severe pain.   SIMVASTATIN (ZOCOR) 40 MG TABLET    Take 40 mg by mouth daily.  Modified Medications   No medications on file  Discontinued Medications   No medications on file

## 2014-12-14 NOTE — Patient Instructions (Signed)
David Fitzgerald- it was nice meeting you today...  We will arrange for a new SLEEP STUDY to be done at [facility] sleep lab as a split-night study...  In the interim-- we will contact [facility] in [place] and see if they can get [place] some information,  and if they can provide new mask/tubing supplies & adjust your machine - try to decrease the pressure settings for tolerability...  You should also make contact w/ [facility] regarding their BARIATRIC SURG PROGRAM...    Call [phone] to start this process  We will arrange for a follow up office visit w/ one of our board certified sleep specialists in 4-6 weeks time.Marland KitchenMarland Kitchen

## 2014-12-19 ENCOUNTER — Ambulatory Visit (INDEPENDENT_AMBULATORY_CARE_PROVIDER_SITE_OTHER): Payer: Medicare Other

## 2014-12-19 DIAGNOSIS — T63441D Toxic effect of venom of bees, accidental (unintentional), subsequent encounter: Secondary | ICD-10-CM | POA: Diagnosis not present

## 2014-12-20 ENCOUNTER — Encounter (HOSPITAL_BASED_OUTPATIENT_CLINIC_OR_DEPARTMENT_OTHER): Payer: Medicare Other

## 2014-12-21 ENCOUNTER — Ambulatory Visit (HOSPITAL_BASED_OUTPATIENT_CLINIC_OR_DEPARTMENT_OTHER): Payer: Medicare Other | Attending: Pulmonary Disease

## 2014-12-21 DIAGNOSIS — G4719 Other hypersomnia: Secondary | ICD-10-CM | POA: Diagnosis not present

## 2014-12-21 DIAGNOSIS — G4736 Sleep related hypoventilation in conditions classified elsewhere: Secondary | ICD-10-CM | POA: Insufficient documentation

## 2014-12-21 DIAGNOSIS — G4733 Obstructive sleep apnea (adult) (pediatric): Secondary | ICD-10-CM | POA: Diagnosis present

## 2014-12-21 DIAGNOSIS — Z6841 Body Mass Index (BMI) 40.0 and over, adult: Secondary | ICD-10-CM | POA: Diagnosis not present

## 2014-12-21 DIAGNOSIS — R0683 Snoring: Secondary | ICD-10-CM | POA: Insufficient documentation

## 2014-12-26 ENCOUNTER — Ambulatory Visit (INDEPENDENT_AMBULATORY_CARE_PROVIDER_SITE_OTHER): Payer: Medicare Other | Admitting: *Deleted

## 2014-12-26 DIAGNOSIS — T63441D Toxic effect of venom of bees, accidental (unintentional), subsequent encounter: Secondary | ICD-10-CM | POA: Diagnosis not present

## 2014-12-27 DIAGNOSIS — G4733 Obstructive sleep apnea (adult) (pediatric): Secondary | ICD-10-CM | POA: Diagnosis not present

## 2014-12-27 NOTE — Addendum Note (Signed)
Addended by: Mathis Dad on: 12/27/2014 02:47 PM   Modules accepted: Orders

## 2014-12-27 NOTE — Progress Notes (Signed)
Patient Name: David Fitzgerald, David Fitzgerald Date: 12/21/2014 Gender: Male D.O.B: 1960-12-16 Age (years): 54 Referring Provider: Teressa Lower Height (inches): 74 Interpreting Physician: Kara Mead MD, ABSM Weight (lbs): 320 RPSGT: Madelon Lips BMI: 41 MRN: [id number] Neck Size: 25.00   CLINICAL INFORMATION Sleep Study Type: NPSG Indication for sleep study: Morbidly obese, excessive daytime somnolence, loud snoring. He was diagnosed with severe OSA years ago but stopped using a BiPAP Epworth Sleepiness Score: 21   SLEEP STUDY TECHNIQUE As per the AASM Manual for the Scoring of Sleep and Associated Events v2.3 (April 2016) with a hypopnea requiring 4% desaturations. The channels recorded and monitored were frontal, central and occipital EEG, electrooculogram (EOG), submentalis EMG (chin), nasal and oral airflow, thoracic and abdominal wall motion, anterior tibialis EMG, snore microphone, electrocardiogram, and pulse oximetry.   SLEEP ARCHITECTURE The study was initiated at 11:13:41 PM and ended at 5:23:58 AM. Sleep onset time was 17.4 minutes and the sleep efficiency was 73.5%. The total sleep time was 272.0 minutes. Stage REM latency was N/A minutes. The patient spent 27.21% of the night in stage N1 sleep, 72.79% in stage N2 sleep, 0.00% in stage N3 and 0.00% in REM. Alpha intrusion was absent. Supine sleep was 10.06%.   RESPIRATORY PARAMETERS The overall apnea/hypopnea index (AHI) was 51.8 per hour. There were 37 total apneas, including 36 obstructive, 1 central and 0 mixed apneas. There were 198 hypopneas and 12 RERAs. The AHI during Stage REM sleep was N/A per hour. AHI while supine was 70.2 per hour. The mean oxygen saturation was 90.28%. The minimum SpO2 during sleep was 80.00%. Moderate snoring was noted during this study.   CARDIAC DATA The 2 lead EKG demonstrated sinus rhythm. The mean heart rate was 62.42 beats per minute. Other EKG findings include: None.   LEG MOVEMENT  DATA The total PLMS were 94 with a resulting PLMS index of 20.74. Associated arousal with leg movement index was 4.2 .   IMPRESSIONS - Severe obstructive sleep apnea occurred during this study (AHI = 51.8/h). - No significant central sleep apnea occurred during this study (CAI = 0.2/h). - Moderate oxygen desaturation was noted during this study (Min O2 = 80.00%). - The patient snored with Moderate snoring volume. - No cardiac abnormalities were noted during this study. - Mild periodic limb movements of sleep occurred during the study. No significant associated arousals.   DIAGNOSIS - Obstructive Sleep Apnea (327.23 [G47.33 ICD-10]) - Nocturnal Hypoxemia (327.26 [G47.36 ICD-10])   RECOMMENDATIONS - Therapeutic CPAP titration to determine optimal pressure required to alleviate sleep disordered breathing. - Avoid alcohol, sedatives and other CNS depressants that may worsen sleep apnea and disrupt normal sleep architecture. - Sleep hygiene should be reviewed to assess factors that may improve sleep quality. - Weight management and regular exercise should be initiated or continued if appropriate.  Kara Mead MD. [place]. [facility]

## 2015-01-04 ENCOUNTER — Ambulatory Visit (HOSPITAL_BASED_OUTPATIENT_CLINIC_OR_DEPARTMENT_OTHER): Payer: Medicare Other | Attending: Pulmonary Disease | Admitting: Radiology

## 2015-01-04 VITALS — Ht 74.0 in | Wt 385.0 lb

## 2015-01-04 DIAGNOSIS — R0683 Snoring: Secondary | ICD-10-CM | POA: Diagnosis not present

## 2015-01-04 DIAGNOSIS — G4733 Obstructive sleep apnea (adult) (pediatric): Secondary | ICD-10-CM | POA: Insufficient documentation

## 2015-01-09 ENCOUNTER — Encounter: Payer: Self-pay | Admitting: Pulmonary Disease

## 2015-01-09 ENCOUNTER — Ambulatory Visit (INDEPENDENT_AMBULATORY_CARE_PROVIDER_SITE_OTHER): Payer: Medicare Other | Admitting: *Deleted

## 2015-01-09 ENCOUNTER — Encounter: Payer: Self-pay | Admitting: Neurology

## 2015-01-09 DIAGNOSIS — T63441D Toxic effect of venom of bees, accidental (unintentional), subsequent encounter: Secondary | ICD-10-CM

## 2015-01-10 ENCOUNTER — Telehealth: Payer: Self-pay | Admitting: *Deleted

## 2015-01-10 DIAGNOSIS — G4733 Obstructive sleep apnea (adult) (pediatric): Secondary | ICD-10-CM | POA: Diagnosis not present

## 2015-01-10 NOTE — Telephone Encounter (Signed)
-----   Message from Rigoberto Noel, MD sent at 01/10/2015  1:29 PM EST ----- Sharyn Lull, Can you schedule a follow-up office visit with me if okay with Dr. Leonidas Romberg CPAP titration study

## 2015-01-10 NOTE — Telephone Encounter (Signed)
Spoke with Dr. Lenna Gilford, he says that he is waiting for Apolonio Schneiders to set up patient's BiPAP through [facility], then he will have patient come back in approx 6 weeks. Will discuss with Apolonio Schneiders tomorrow per Dr. Jeannine Kitten recommendation.

## 2015-01-10 NOTE — Progress Notes (Signed)
Patient Name: David Fitzgerald, David Fitzgerald Date: 01/04/2015 Gender: Male D.O.B: [birth year] Age (years): 55 Referring Provider: Kara Mead MD, ABSM Height (inches): 74 Interpreting Physician: Kara Mead MD, ABSM Weight (lbs): 385 RPSGT: Laren Everts BMI: 69 MRN: [id number] Neck Size: 25.00  CLINICAL INFORMATION The patient is referred for a CPAP titration to treat sleep apnea. Date of NPSG:12/2014 ,Showed severe OSA with AHI of 52/hour  He is maintained on a BiPAP 20/16  SLEEP STUDY TECHNIQUE As per the AASM Manual for the Scoring of Sleep and Associated Events v2.3 (April 2016) with a hypopnea requiring 4% desaturations. The channels recorded and monitored were frontal, central and occipital EEG, electrooculogram (EOG), submentalis EMG (chin), nasal and oral airflow, thoracic and abdominal wall motion, anterior tibialis EMG, snore microphone, electrocardiogram, and pulse oximetry. Continuous positive airway pressure (CPAP) was initiated at the beginning of the study and titrated to treat sleep-disordered breathing.   MEDICATIONS Medications taken by the patient : N/A  Medications administered by patient during sleep study : No sleep medicine administered.   RESPIRATORY PARAMETERS  1 L of oxygen was added at 2 AM due to persistent desaturations Optimal PAP Pressure (cm): 16 AHI at Optimal Pressure (/hr): 6.4 Overall Minimal O2 (%): 73.00 Supine % at Optimal Pressure (%): 100 Minimal O2 at Optimal Pressure (%): 88.0       SLEEP ARCHITECTURE The study was initiated at 10:06:00 PM and ended at 4:44:00 AM. Sleep onset time was 72.8 minutes and the sleep efficiency was 53.5%. The total sleep time was 213.0 minutes. The patient spent 44.37% of the night in stage N1 sleep, 45.07% in stage N2 sleep, 0.00% in stage N3 and 10.56% in REM.Stage REM latency was 119.5 minutes Wake after sleep onset was 112.2. Alpha intrusion was absent. Supine sleep was 74.18%.   CARDIAC DATA The 2 lead EKG  demonstrated sinus rhythm. The mean heart rate was 64.95 beats per minute. Other EKG findings include: None.   LEG MOVEMENT DATA The total Periodic Limb Movements of Sleep (PLMS) were 0. The PLMS index was 0.00. A PLMS index of <15 is considered normal in adults.   IMPRESSIONS - The optimal PAP pressure was 16 cm of water With 1 L oxygen blended in - Central sleep apnea was not noted during this titration (CAI = 0.0/h). - Severe oxygen desaturations were observed during this titration (min O2 = 73.00%). - The patient snored with Moderate snoring volume during this titration study. - No cardiac abnormalities were observed during this study. - Clinically significant periodic limb movements were not noted during this study. Arousals associated with PLMs were rare.   DIAGNOSIS - Obstructive Sleep Apnea (327.23 [G47.33 ICD-10])   RECOMMENDATIONS - CPAP therapy on 16 cm H2O with a Large size [employer]kel Full Face Mask Simplus mask and heated humidification. - 1 L of oxygen can be blended in - It seems that he is currently maintained on BiPAP settings of 20/16-this would also be acceptable - Avoid alcohol, sedatives and other CNS depressants that may worsen sleep apnea and disrupt normal sleep architecture. - Sleep hygiene should be reviewed to assess factors that may improve sleep quality. - Weight management and regular exercise should be initiated or continued. - Return for re-evaluation after 4 weeks of therapy   Kara Mead MD. FCCP. [facility]

## 2015-01-11 ENCOUNTER — Telehealth: Payer: Self-pay | Admitting: Pulmonary Disease

## 2015-01-11 DIAGNOSIS — G4733 Obstructive sleep apnea (adult) (pediatric): Secondary | ICD-10-CM

## 2015-01-11 NOTE — Telephone Encounter (Signed)
Sharyn Lull, did you discuss with Apolonio Schneiders regarding pt?

## 2015-01-11 NOTE — Telephone Encounter (Signed)
Per SN>> Based on recent sleep study read by Dr Elsworth Soho pt needs to begin 1L of cont O2 QHS via his BIPAP. Pt also needs a follow up appt with Dr Elsworth Soho in mid-late January to ensure proper settings  Order has been placed for pt to begin nocturnal O2 via BIPAP device Pt's DME is [facility]  Will send this message to Dr Bari Mantis nurse to see where pt can be schedule on Dr Bari Mantis schedule  Sharyn Lull, please advise.

## 2015-01-13 NOTE — Telephone Encounter (Signed)
Order has been placed per SN Pt needs to be scheduled with Dr Elsworth Soho for 6 week follow up  Sharyn Lull, please advise. Thanks

## 2015-01-16 ENCOUNTER — Ambulatory Visit (INDEPENDENT_AMBULATORY_CARE_PROVIDER_SITE_OTHER): Payer: Medicare Other | Admitting: *Deleted

## 2015-01-16 DIAGNOSIS — T63441D Toxic effect of venom of bees, accidental (unintentional), subsequent encounter: Secondary | ICD-10-CM

## 2015-01-16 NOTE — Telephone Encounter (Signed)
Please advise David Fitzgerald. thanks

## 2015-01-17 NOTE — Telephone Encounter (Signed)
See TE dated 12/9 Closing this encounter.

## 2015-01-18 NOTE — Telephone Encounter (Signed)
Will schedule appt when RA returns. Hold in box until then

## 2015-01-23 ENCOUNTER — Ambulatory Visit (INDEPENDENT_AMBULATORY_CARE_PROVIDER_SITE_OTHER): Payer: Medicare Other | Admitting: *Deleted

## 2015-01-23 DIAGNOSIS — T63441D Toxic effect of venom of bees, accidental (unintentional), subsequent encounter: Secondary | ICD-10-CM | POA: Diagnosis not present

## 2015-01-24 NOTE — Telephone Encounter (Signed)
RA - please advise where to put on your schedule.

## 2015-02-06 NOTE — Telephone Encounter (Signed)
Place patient on call list- I with open additional office times 

## 2015-02-07 ENCOUNTER — Telehealth: Payer: Self-pay | Admitting: Pulmonary Disease

## 2015-02-07 NOTE — Telephone Encounter (Signed)
Patient calling to get update on oxygen.  Patient's oxygen was ordered 12/7, but he has not heard anything and still has not been set up with his oxygen. David Fitzgerald, can you check up on this for patient?  He says if he does not answer, you can leave a detailed message on his voicemail.  Thanks.

## 2015-02-07 NOTE — Telephone Encounter (Signed)
James from [employer] called. He states they called pt on 12/8 & left vm for pt to call them to set up appt when he would be home. Pt never called them back. He states he will call pt today & set up something.  I called pt & left him vm to make him aware Jeneen Rinks from [facility] would be calling him today. Nothing further needed.

## 2015-02-07 NOTE — Telephone Encounter (Signed)
Called [facility] to check status of O2 order that was faxed on 12/8. Spoke to [employer]. He is going to check with main office & have someone to call me back.

## 2015-02-07 NOTE — Telephone Encounter (Signed)
Placed on call list, will contact patient once we have openings for clinic Nothing further needed.

## 2015-02-16 ENCOUNTER — Ambulatory Visit (INDEPENDENT_AMBULATORY_CARE_PROVIDER_SITE_OTHER): Payer: Medicare Other | Admitting: *Deleted

## 2015-02-16 DIAGNOSIS — T63441D Toxic effect of venom of bees, accidental (unintentional), subsequent encounter: Secondary | ICD-10-CM

## 2015-02-16 DIAGNOSIS — T63441A Toxic effect of venom of bees, accidental (unintentional), initial encounter: Secondary | ICD-10-CM

## 2015-02-16 DIAGNOSIS — IMO0001 Reserved for inherently not codable concepts without codable children: Secondary | ICD-10-CM

## 2015-02-23 ENCOUNTER — Ambulatory Visit (INDEPENDENT_AMBULATORY_CARE_PROVIDER_SITE_OTHER): Payer: Medicare Other

## 2015-02-23 DIAGNOSIS — T63441A Toxic effect of venom of bees, accidental (unintentional), initial encounter: Secondary | ICD-10-CM | POA: Diagnosis not present

## 2015-02-23 DIAGNOSIS — IMO0001 Reserved for inherently not codable concepts without codable children: Secondary | ICD-10-CM

## 2015-02-27 ENCOUNTER — Encounter: Payer: Self-pay | Admitting: Pulmonary Disease

## 2015-02-27 ENCOUNTER — Ambulatory Visit (INDEPENDENT_AMBULATORY_CARE_PROVIDER_SITE_OTHER): Payer: Medicare Other | Admitting: Pulmonary Disease

## 2015-02-27 ENCOUNTER — Ambulatory Visit (HOSPITAL_BASED_OUTPATIENT_CLINIC_OR_DEPARTMENT_OTHER): Payer: Medicare Other

## 2015-02-27 VITALS — BP 132/72 | HR 64 | Ht 74.0 in | Wt 386.6 lb

## 2015-02-27 DIAGNOSIS — G4733 Obstructive sleep apnea (adult) (pediatric): Secondary | ICD-10-CM | POA: Diagnosis not present

## 2015-02-27 NOTE — Assessment & Plan Note (Signed)
Encouraged wt loss

## 2015-02-27 NOTE — Patient Instructions (Signed)
Stay on Bipap We will check report on your machine from [facility] Supplies will be renewed x 1 year

## 2015-02-27 NOTE — Assessment & Plan Note (Signed)
Stay on Bipap We will check report on your machine from [facility] Supplies will be ordered x 1 year If remains sleepy next visit, consider nuvigil Med related hypersomnolence - neurontin, pain meds

## 2015-02-27 NOTE — Progress Notes (Signed)
   Subjective:    Patient ID: David Fitzgerald, male    DOB: [birth year], 55 y.o.   MRN: [id number]  HPI PCP- Eduardo Osier    55 year old obese man for follow-up of severe OSA  He was placed on BiPAP 4 years ago, and then discontinued due to compliance issues.  In 2014, he obtained BiPAP from [facility]  After a sleep study at [facility], ordered by neurology.  Due to high pressure he was not very compliant, he is gained 100 pounds since that study  Chief Complaint  Patient presents with  . Follow-up    Pt states that he has been doing well. Pt is here for sleep study/titration results. Pt is on O2/1L at night and he does feel that this is helping him.    bipap was lowered from 20 to 16 Using bipap - likes lower settings, more comfortable Mask ok-FF, pr ok, no dryness  he takes Lasix daily for pedal edema  Reviewed PSG 12/2014 >> 385 lbs - severe, AHI 52/h CPAP 16 cm + 1L O2    Review of Systems neg for any significant sore throat, dysphagia, itching, sneezing, nasal congestion or excess/ purulent secretions, fever, chills, sweats, unintended wt loss, pleuritic or exertional cp, hempoptysis, orthopnea pnd or change in chronic leg swelling. Also denies presyncope, palpitations, heartburn, abdominal pain, nausea, vomiting, diarrhea or change in bowel or urinary habits, dysuria,hematuria, rash, arthralgias, visual complaints, headache, numbness weakness or ataxia.     Objective:   Physical Exam  Gen. Pleasant, obese, in no distress ENT - no lesions, no post nasal drip, class 2 airway Neck: No JVD, no thyromegaly, no carotid bruits Lungs: no use of accessory muscles, no dullness to percussion, decreased without rales or rhonchi  Cardiovascular: Rhythm regular, heart sounds  normal, no murmurs or gallops, no peripheral edema Musculoskeletal: No deformities, no cyanosis or clubbing , no tremors       Assessment & Plan:

## 2015-03-02 ENCOUNTER — Ambulatory Visit (INDEPENDENT_AMBULATORY_CARE_PROVIDER_SITE_OTHER): Payer: Medicare Other

## 2015-03-02 DIAGNOSIS — IMO0001 Reserved for inherently not codable concepts without codable children: Secondary | ICD-10-CM

## 2015-03-02 DIAGNOSIS — T63441D Toxic effect of venom of bees, accidental (unintentional), subsequent encounter: Secondary | ICD-10-CM

## 2015-03-13 ENCOUNTER — Encounter (HOSPITAL_BASED_OUTPATIENT_CLINIC_OR_DEPARTMENT_OTHER): Payer: Medicare Other

## 2015-03-16 ENCOUNTER — Ambulatory Visit (INDEPENDENT_AMBULATORY_CARE_PROVIDER_SITE_OTHER): Payer: Medicare Other | Admitting: *Deleted

## 2015-03-16 DIAGNOSIS — T63441D Toxic effect of venom of bees, accidental (unintentional), subsequent encounter: Secondary | ICD-10-CM

## 2015-03-16 DIAGNOSIS — IMO0001 Reserved for inherently not codable concepts without codable children: Secondary | ICD-10-CM

## 2015-03-24 ENCOUNTER — Ambulatory Visit: Payer: Medicare Other | Admitting: Pulmonary Disease

## 2015-03-27 ENCOUNTER — Ambulatory Visit (INDEPENDENT_AMBULATORY_CARE_PROVIDER_SITE_OTHER): Payer: Medicare Other

## 2015-03-27 DIAGNOSIS — T63441D Toxic effect of venom of bees, accidental (unintentional), subsequent encounter: Secondary | ICD-10-CM | POA: Diagnosis not present

## 2015-03-27 DIAGNOSIS — IMO0001 Reserved for inherently not codable concepts without codable children: Secondary | ICD-10-CM

## 2015-04-03 ENCOUNTER — Ambulatory Visit (INDEPENDENT_AMBULATORY_CARE_PROVIDER_SITE_OTHER): Payer: Medicare Other | Admitting: *Deleted

## 2015-04-03 DIAGNOSIS — IMO0001 Reserved for inherently not codable concepts without codable children: Secondary | ICD-10-CM

## 2015-04-03 DIAGNOSIS — T63441D Toxic effect of venom of bees, accidental (unintentional), subsequent encounter: Secondary | ICD-10-CM | POA: Diagnosis not present

## 2015-04-06 DIAGNOSIS — Z9109 Other allergy status, other than to drugs and biological substances: Secondary | ICD-10-CM

## 2015-04-06 HISTORY — DX: Other allergy status, other than to drugs and biological substances: Z91.09

## 2015-04-10 ENCOUNTER — Ambulatory Visit (INDEPENDENT_AMBULATORY_CARE_PROVIDER_SITE_OTHER): Payer: Medicare Other

## 2015-04-10 DIAGNOSIS — T63441D Toxic effect of venom of bees, accidental (unintentional), subsequent encounter: Secondary | ICD-10-CM | POA: Diagnosis not present

## 2015-04-17 ENCOUNTER — Ambulatory Visit (INDEPENDENT_AMBULATORY_CARE_PROVIDER_SITE_OTHER): Payer: Medicare Other | Admitting: *Deleted

## 2015-04-17 DIAGNOSIS — T63441D Toxic effect of venom of bees, accidental (unintentional), subsequent encounter: Secondary | ICD-10-CM

## 2015-04-24 ENCOUNTER — Ambulatory Visit (INDEPENDENT_AMBULATORY_CARE_PROVIDER_SITE_OTHER): Payer: Medicare Other | Admitting: *Deleted

## 2015-04-24 DIAGNOSIS — IMO0001 Reserved for inherently not codable concepts without codable children: Secondary | ICD-10-CM

## 2015-04-24 DIAGNOSIS — T63441D Toxic effect of venom of bees, accidental (unintentional), subsequent encounter: Secondary | ICD-10-CM | POA: Diagnosis not present

## 2015-04-26 ENCOUNTER — Ambulatory Visit (INDEPENDENT_AMBULATORY_CARE_PROVIDER_SITE_OTHER): Payer: Medicare Other | Admitting: Allergy and Immunology

## 2015-04-26 ENCOUNTER — Encounter: Payer: Self-pay | Admitting: Allergy and Immunology

## 2015-04-26 VITALS — BP 150/92 | HR 20 | Resp 96

## 2015-04-26 DIAGNOSIS — I10 Essential (primary) hypertension: Secondary | ICD-10-CM

## 2015-04-26 DIAGNOSIS — Z79899 Other long term (current) drug therapy: Secondary | ICD-10-CM | POA: Diagnosis not present

## 2015-04-26 DIAGNOSIS — Z91038 Other insect allergy status: Secondary | ICD-10-CM

## 2015-04-26 DIAGNOSIS — Z91030 Bee allergy status: Secondary | ICD-10-CM

## 2015-04-26 NOTE — Progress Notes (Signed)
Follow-up Note  Referring Provider: No ref. provider found Primary Provider: Angelina Sheriff., MD Date of Office Visit: 04/26/2015  Subjective:   David Fitzgerald (DOB: 1960-10-13) is a 55 y.o. 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 He has not had any adverse effects secondary to the administration of this venom immunotherapy. He is not quite at maintenance dose yet. He does continue to have a EpiPen and he has not had a field staining to date since starting immunotherapy.  David Fitzgerald has rather significant systemic arterial hypertension and he is on 3 drug combination in an attempt to control this issue. It should be noted that he is on a beta blocker which is a relative contraindication for immunotherapy. He appears to understand that his weight is contributing to his blood pressure problem.     Medication List       This list is accurate as of: 04/26/15  3:21 PM.  Always use your most recent med list.               aspirin EC 325 MG tablet  325 mg.     BENADRYL ALLERGY PO  Take by mouth as needed.     EPIPEN 2-PAK 0.3 mg/0.3 mL Soaj injection  Generic drug:  EPINEPHrine  Inject 0.3 mg into the muscle once.     Fish Oil 1000 MG Caps  Take 1 capsule by mouth daily.     fluticasone 50 MCG/ACT nasal spray  Commonly known as:  FLONASE  Place 2 sprays into both nostrils daily. Reported on 04/26/2015     furosemide 40 MG tablet  Commonly known as:  LASIX  Take 40 mg by mouth.     gabapentin 300 MG capsule  Commonly known as:  NEURONTIN  Take 600 mg by mouth 3 (three) times daily.     glimepiride 2 MG tablet  Commonly known as:  AMARYL  Take 1 tablet by mouth every morning.     lisinopril 40 MG tablet  Commonly known as:   PRINIVIL,ZESTRIL  Take 40 mg by mouth daily.     losartan 50 MG tablet  Commonly known as:  COZAAR  Take 50 mg by mouth daily.     metoprolol tartrate 25 mg/10 mL Susp  Commonly known as:  LOPRESSOR  Take 25 mg by mouth 2 (two) times daily.     MIXED VESPID VENOM PROTEIN [place]  Inject into the skin.     NITROSTAT 0.4 MG SL tablet  Generic drug:  nitroGLYCERIN  Reported on 04/26/2015     oxyCODONE-acetaminophen 7.5-325 MG tablet  Commonly known as:  PERCOCET  Take 1 tablet by mouth every 8 (eight) hours as needed for severe pain.     simvastatin 40 MG tablet  Commonly known as:  ZOCOR  Take 40 mg by mouth daily.     VENOMIL WASP VENOM IJ  Inject as directed.        Past Medical History  Diagnosis Date  . Diabetes ([place])   . Bee sting allergy     wasp / mixed vespid  . Hypertension     Past Surgical History  Procedure Laterality Date  . Adenoidectomy    . Tonsillectomy    . Back surgery  2002 /2003  . Shoulder surgery  Left 1998  . Knee surgery Left 2005    Allergies  Allergen Reactions  . Penicillins     Review of systems negative except as noted in HPI / PMHx or noted below:  Review of Systems  Constitutional: Negative.   HENT: Negative.   Eyes: Negative.   Respiratory: Negative.        Sleep apnea treated with CPAP  Cardiovascular: Negative.   Gastrointestinal: Negative.   Genitourinary: Negative.   Musculoskeletal: Negative.   Skin: Negative.   Neurological: Negative.   Endo/Heme/Allergies: Negative.   Psychiatric/Behavioral: Negative.      Objective:   Filed Vitals:   04/26/15 1456  BP: 150/92  Pulse: 20  Resp: 96          Physical Exam  Constitutional: He is well-developed, well-nourished, and in no distress.  HENT:  Head: Normocephalic.  Right Ear: Tympanic membrane, external ear and ear canal normal.  Left Ear: Tympanic membrane, external ear and ear canal normal.  Nose: Nose normal. No mucosal edema or rhinorrhea.    Mouth/Throat: Uvula is midline, oropharynx is clear and moist and mucous membranes are normal. No oropharyngeal exudate.  Eyes: Conjunctivae are normal.  Neck: Trachea normal. No tracheal tenderness present. No tracheal deviation present. No thyromegaly present.  Cardiovascular: Normal rate, regular rhythm, S1 normal, S2 normal and normal heart sounds.   No murmur heard. Pulmonary/Chest: Breath sounds normal. No stridor. No respiratory distress. He has no wheezes. He has no rales.  Musculoskeletal: He exhibits no edema.  Lymphadenopathy:       Head (right side): No tonsillar adenopathy present.       Head (left side): No tonsillar adenopathy present.    He has no cervical adenopathy.    He has no axillary adenopathy.  Neurological: He is alert. Gait normal.  Skin: No rash noted. He is not diaphoretic. No erythema. Nails show no clubbing.  Psychiatric: Mood and affect normal.    Diagnostics: None  Assessment and Plan:   1. Hymenoptera allergy   2. Essential hypertension   3. Current use of beta blocker   4. Morbid obesity, unspecified obesity type ([place])     1. Continue immunotherapy and EpiPen  2. Return to clinic in 1 year or earlier if problem  3. Undergo weight reduction program   David Fitzgerald will continue on immunotherapy and I will see him back in this clinic in approximately one year or earlier if there is a problem. I did have a long talk with him today about his systemic control hypertension requiring 3 drug treatment including a beta blocker. Because of the severity of his systemic arterial hypertension I think the use of a beta blocker in the setting of immunotherapy is probably warranted. I did also discuss with him about his morbid obesity and how he may want to consider surgical correction of this issue. He appears to understand that his morbid obesity is contributing to his high blood pressure and sleep apnea and probably has diabetes. Apparently he is already had a  discussion about surgical correction with some of his doctors and unfortunately he knows several people who have had gastric bypass personally and they have not done very well with this procedure and thus he is not very interested in undergoing a surgical correction of his obesity. I did have a talk with him today about the need to lose approximately 1-2 pounds per week on a long-term basis rather than undergoing some funny diet where he loses weight quickly.  [name], MD [place]

## 2015-04-26 NOTE — Patient Instructions (Signed)
  1. Continue immunotherapy and EpiPen  2. Return to clinic in 1 year or earlier if problem  3. Undergo weight reduction program

## 2015-05-05 ENCOUNTER — Ambulatory Visit (INDEPENDENT_AMBULATORY_CARE_PROVIDER_SITE_OTHER): Payer: Medicare Other | Admitting: *Deleted

## 2015-05-05 ENCOUNTER — Telehealth: Payer: Self-pay | Admitting: Pulmonary Disease

## 2015-05-05 DIAGNOSIS — T63441D Toxic effect of venom of bees, accidental (unintentional), subsequent encounter: Secondary | ICD-10-CM

## 2015-05-05 NOTE — Telephone Encounter (Signed)
Spoke with David Fitzgerald at [facility]. States that [place] and Judeen Hammans are working on this for him. Nothing further was needed at this time.

## 2015-05-11 ENCOUNTER — Ambulatory Visit (INDEPENDENT_AMBULATORY_CARE_PROVIDER_SITE_OTHER): Payer: Medicare Other | Admitting: *Deleted

## 2015-05-11 DIAGNOSIS — T63441D Toxic effect of venom of bees, accidental (unintentional), subsequent encounter: Secondary | ICD-10-CM | POA: Diagnosis not present

## 2015-05-18 ENCOUNTER — Ambulatory Visit (INDEPENDENT_AMBULATORY_CARE_PROVIDER_SITE_OTHER): Payer: Medicare Other | Admitting: *Deleted

## 2015-05-18 DIAGNOSIS — T63441D Toxic effect of venom of bees, accidental (unintentional), subsequent encounter: Secondary | ICD-10-CM

## 2015-05-25 ENCOUNTER — Ambulatory Visit (INDEPENDENT_AMBULATORY_CARE_PROVIDER_SITE_OTHER): Payer: Medicare Other | Admitting: *Deleted

## 2015-05-25 DIAGNOSIS — Z91030 Bee allergy status: Secondary | ICD-10-CM

## 2015-05-25 DIAGNOSIS — Z91038 Other insect allergy status: Secondary | ICD-10-CM

## 2015-06-01 ENCOUNTER — Ambulatory Visit (INDEPENDENT_AMBULATORY_CARE_PROVIDER_SITE_OTHER): Payer: Medicare Other | Admitting: *Deleted

## 2015-06-01 DIAGNOSIS — T63441D Toxic effect of venom of bees, accidental (unintentional), subsequent encounter: Secondary | ICD-10-CM

## 2015-06-08 ENCOUNTER — Ambulatory Visit (INDEPENDENT_AMBULATORY_CARE_PROVIDER_SITE_OTHER): Payer: Medicare Other | Admitting: *Deleted

## 2015-06-08 DIAGNOSIS — T63441D Toxic effect of venom of bees, accidental (unintentional), subsequent encounter: Secondary | ICD-10-CM | POA: Diagnosis not present

## 2015-06-15 ENCOUNTER — Ambulatory Visit (INDEPENDENT_AMBULATORY_CARE_PROVIDER_SITE_OTHER): Payer: Medicare Other

## 2015-06-15 DIAGNOSIS — T63441D Toxic effect of venom of bees, accidental (unintentional), subsequent encounter: Secondary | ICD-10-CM | POA: Diagnosis not present

## 2015-06-16 DIAGNOSIS — T63441D Toxic effect of venom of bees, accidental (unintentional), subsequent encounter: Secondary | ICD-10-CM | POA: Diagnosis not present

## 2015-06-22 ENCOUNTER — Ambulatory Visit (INDEPENDENT_AMBULATORY_CARE_PROVIDER_SITE_OTHER): Payer: Medicare Other | Admitting: *Deleted

## 2015-06-22 DIAGNOSIS — T63441D Toxic effect of venom of bees, accidental (unintentional), subsequent encounter: Secondary | ICD-10-CM | POA: Diagnosis not present

## 2015-06-22 DIAGNOSIS — IMO0001 Reserved for inherently not codable concepts without codable children: Secondary | ICD-10-CM

## 2015-07-10 ENCOUNTER — Ambulatory Visit (INDEPENDENT_AMBULATORY_CARE_PROVIDER_SITE_OTHER): Payer: Medicare Other

## 2015-07-10 DIAGNOSIS — T63441D Toxic effect of venom of bees, accidental (unintentional), subsequent encounter: Secondary | ICD-10-CM

## 2015-07-20 ENCOUNTER — Ambulatory Visit (INDEPENDENT_AMBULATORY_CARE_PROVIDER_SITE_OTHER): Payer: Medicare Other

## 2015-07-20 DIAGNOSIS — T63441D Toxic effect of venom of bees, accidental (unintentional), subsequent encounter: Secondary | ICD-10-CM

## 2015-07-31 ENCOUNTER — Ambulatory Visit (INDEPENDENT_AMBULATORY_CARE_PROVIDER_SITE_OTHER): Payer: Medicare Other | Admitting: *Deleted

## 2015-07-31 DIAGNOSIS — T63441D Toxic effect of venom of bees, accidental (unintentional), subsequent encounter: Secondary | ICD-10-CM

## 2015-07-31 DIAGNOSIS — IMO0001 Reserved for inherently not codable concepts without codable children: Secondary | ICD-10-CM

## 2015-08-10 ENCOUNTER — Ambulatory Visit (INDEPENDENT_AMBULATORY_CARE_PROVIDER_SITE_OTHER): Payer: Medicare Other | Admitting: *Deleted

## 2015-08-10 DIAGNOSIS — T63441D Toxic effect of venom of bees, accidental (unintentional), subsequent encounter: Secondary | ICD-10-CM | POA: Diagnosis not present

## 2015-08-10 DIAGNOSIS — IMO0001 Reserved for inherently not codable concepts without codable children: Secondary | ICD-10-CM

## 2015-08-17 ENCOUNTER — Ambulatory Visit (INDEPENDENT_AMBULATORY_CARE_PROVIDER_SITE_OTHER): Payer: Medicare Other | Admitting: *Deleted

## 2015-08-17 DIAGNOSIS — T63441D Toxic effect of venom of bees, accidental (unintentional), subsequent encounter: Secondary | ICD-10-CM | POA: Diagnosis not present

## 2015-08-17 DIAGNOSIS — IMO0001 Reserved for inherently not codable concepts without codable children: Secondary | ICD-10-CM

## 2015-08-24 ENCOUNTER — Ambulatory Visit (INDEPENDENT_AMBULATORY_CARE_PROVIDER_SITE_OTHER): Payer: Medicare Other | Admitting: *Deleted

## 2015-08-24 DIAGNOSIS — IMO0001 Reserved for inherently not codable concepts without codable children: Secondary | ICD-10-CM

## 2015-08-24 DIAGNOSIS — T63441D Toxic effect of venom of bees, accidental (unintentional), subsequent encounter: Secondary | ICD-10-CM

## 2015-08-28 ENCOUNTER — Ambulatory Visit: Payer: Medicare Other | Admitting: Adult Health

## 2015-08-31 ENCOUNTER — Encounter (INDEPENDENT_AMBULATORY_CARE_PROVIDER_SITE_OTHER): Payer: Self-pay | Admitting: *Deleted

## 2015-08-31 NOTE — Progress Notes (Signed)
This encounter was created in error - please disregard.

## 2015-08-31 NOTE — Progress Notes (Signed)
David Fitzgerald CAME IN FOR HIS VENOM INJECTIONS BUT WE COULD NOT ADMINISTER DUE TO HIM GETTING STUNG BY A WASP YESTERDAY.

## 2015-09-04 ENCOUNTER — Ambulatory Visit (INDEPENDENT_AMBULATORY_CARE_PROVIDER_SITE_OTHER): Payer: Medicare Other | Admitting: Adult Health

## 2015-09-04 ENCOUNTER — Encounter: Payer: Self-pay | Admitting: Adult Health

## 2015-09-04 DIAGNOSIS — G4733 Obstructive sleep apnea (adult) (pediatric): Secondary | ICD-10-CM | POA: Diagnosis not present

## 2015-09-04 NOTE — Progress Notes (Signed)
Subjective:    Patient ID: David Fitzgerald, male    DOB: [birth year], 55 y.o.   MRN: [id number]  HPI 55 yo male with severe OSA on BIPAP   TEST  Reviewed PSG 12/2014 >> 385 lbs - severe, AHI 52/h CPAP 16 cm + 1L O2   09/04/2015 Follow up : OSA  Pt returns for a 6 month follow up for severe sleep apnea. Roney Jaffe he is doing well on BIPAP  Uses BIPAP with O2 at 1l/m on average 5 hours nightly, mask fits well.  Unable to get download .  Working on weight loss. Eating healthier. Lost 10lbs.  Denies chest pain, orthopnea, edema or fever.      Past Medical History:  Diagnosis Date  . Bee sting allergy    wasp / mixed vespid  . Diabetes ([place])   . Hypertension    Current Outpatient Prescriptions on File Prior to Visit  Medication Sig Dispense Refill  . aspirin EC 325 MG tablet 325 mg.    . DiphenhydrAMINE HCl (BENADRYL ALLERGY PO) Take by mouth as needed.    Marland Kitchen EPINEPHrine (EPIPEN 2-PAK) 0.3 mg/0.3 mL IJ SOAJ injection Inject 0.3 mg into the muscle once.    . fluticasone (FLONASE) 50 MCG/ACT nasal spray Place 2 sprays into both nostrils daily. Reported on 04/26/2015    . furosemide (LASIX) 40 MG tablet Take 40 mg by mouth.    . gabapentin (NEURONTIN) 300 MG capsule Take 600 mg by mouth 3 (three) times daily.     Marland Kitchen glimepiride (AMARYL) 2 MG tablet Take 1 tablet by mouth every morning.    Marland Kitchen lisinopril (PRINIVIL,ZESTRIL) 40 MG tablet Take 40 mg by mouth daily.    Marland Kitchen losartan (COZAAR) 50 MG tablet Take 50 mg by mouth daily.    . metoprolol tartrate (LOPRESSOR) 25 mg/10 mL SUSP Take 25 mg by mouth 2 (two) times daily.    Marland Kitchen MIXED VESPID VENOM PROTEIN [place] Inject into the skin.    Marland Kitchen nitroGLYCERIN (NITROSTAT) 0.4 MG SL tablet Reported on 04/26/2015    . Omega-3 Fatty Acids (FISH OIL) 1000 MG CAPS Take 1 capsule by mouth daily.    Marland Kitchen oxyCODONE-acetaminophen (PERCOCET) 7.5-325 MG per tablet Take 1 tablet by mouth every 8 (eight) hours as needed for severe pain.    . simvastatin (ZOCOR) 40 MG  tablet Take 40 mg by mouth daily.    . VENOMIL WASP VENOM IJ Inject as directed.     No current facility-administered medications on file prior to visit.     Review of Systems   Constitutional:   No  weight loss, night sweats,  Fevers, chills, fatigue, or  lassitude.  HEENT:   No headaches,  Difficulty swallowing,  Tooth/dental problems, or  Sore throat,                No sneezing, itching, ear ache, nasal congestion, post nasal drip,   CV:  No chest pain,  Orthopnea, PND, swelling in lower extremities, anasarca, dizziness, palpitations, syncope.   GI  No heartburn, indigestion, abdominal pain, nausea, vomiting, diarrhea, change in bowel habits, loss of appetite, bloody stools.   Resp: No shortness of breath with exertion or at rest.  No excess mucus, no productive cough,  No non-productive cough,  No coughing up of blood.  No change in color of mucus.  No wheezing.  No chest wall deformity  Skin: no rash or lesions.  GU: no dysuria, change in color of urine,  no urgency or frequency.  No flank pain, no hematuria   MS:  No joint pain or swelling.  No decreased range of motion.  No back pain.  Psych:  No change in mood or affect. No depression or anxiety.  No memory loss.      Objective:   Physical Exam Vitals:   09/04/15 1418  BP: (!) 144/84  Pulse: 70  Temp: 98.1 F (36.7 C)  TempSrc: Oral  SpO2: 95%  Weight: (!) 376 lb (170.6 kg)  Height: 6\' 2"  (1.88 m)  Body mass index is 48.28 kg/m.  GEN: A/Ox3; pleasant , NAD, obese    HEENT:  [place]/AT,  EACs-clear, TMs-wnl, NOSE-clear, THROAT-clear, no lesions, no postnasal drip or exudate noted. Class 3-4 MP aireay   NECK:  Supple w/ fair ROM; no JVD; normal carotid impulses w/o bruits; no thyromegaly or nodules palpated; no lymphadenopathy.    RESP  Clear  P & A; w/o, wheezes/ rales/ or rhonchi. no accessory muscle use, no dullness to percussion  CARD:  RRR, no m/r/g  , tr  peripheral edema, pulses intact, no cyanosis or  clubbing.  GI:   Soft & nt; nml bowel sounds; no organomegaly or masses detected.   Musco: Warm bil, no deformities or joint swelling noted.   Neuro: alert, no focal deficits noted.    Skin: Warm, no lesions or rashes  Tammy Parrett NP-C  [facility]  09/04/2015

## 2015-09-04 NOTE — Patient Instructions (Addendum)
Continue on BIPAP At bedtime  With Oxygen at 1L/m  Work on weight loss.  Do not drive if sleepy.  Follow up Dr. Elsworth Soho  In 1 year and As needed

## 2015-09-04 NOTE — Assessment & Plan Note (Signed)
OHS/OSA doing well on BIPAP /O2   Plan  Continue on BIPAP At bedtime  With Oxygen at 1L/m  Work on weight loss.  Do not drive if sleepy.  Follow up Dr. Elsworth Soho  In 1 year and As needed

## 2015-09-05 DIAGNOSIS — T63441D Toxic effect of venom of bees, accidental (unintentional), subsequent encounter: Secondary | ICD-10-CM | POA: Diagnosis not present

## 2015-09-06 DIAGNOSIS — T63441D Toxic effect of venom of bees, accidental (unintentional), subsequent encounter: Secondary | ICD-10-CM | POA: Diagnosis not present

## 2015-09-07 ENCOUNTER — Ambulatory Visit (INDEPENDENT_AMBULATORY_CARE_PROVIDER_SITE_OTHER): Payer: Medicare Other | Admitting: *Deleted

## 2015-09-07 DIAGNOSIS — T63441D Toxic effect of venom of bees, accidental (unintentional), subsequent encounter: Secondary | ICD-10-CM | POA: Diagnosis not present

## 2015-09-07 DIAGNOSIS — IMO0001 Reserved for inherently not codable concepts without codable children: Secondary | ICD-10-CM

## 2015-09-21 ENCOUNTER — Ambulatory Visit (INDEPENDENT_AMBULATORY_CARE_PROVIDER_SITE_OTHER): Payer: Medicare Other | Admitting: *Deleted

## 2015-09-21 DIAGNOSIS — T63441D Toxic effect of venom of bees, accidental (unintentional), subsequent encounter: Secondary | ICD-10-CM | POA: Diagnosis not present

## 2015-09-21 DIAGNOSIS — IMO0001 Reserved for inherently not codable concepts without codable children: Secondary | ICD-10-CM

## 2015-10-12 ENCOUNTER — Ambulatory Visit (INDEPENDENT_AMBULATORY_CARE_PROVIDER_SITE_OTHER): Payer: Medicare Other | Admitting: *Deleted

## 2015-10-12 DIAGNOSIS — T63441D Toxic effect of venom of bees, accidental (unintentional), subsequent encounter: Secondary | ICD-10-CM

## 2015-10-12 DIAGNOSIS — IMO0001 Reserved for inherently not codable concepts without codable children: Secondary | ICD-10-CM

## 2015-11-09 ENCOUNTER — Ambulatory Visit (INDEPENDENT_AMBULATORY_CARE_PROVIDER_SITE_OTHER): Payer: Medicare Other | Admitting: *Deleted

## 2015-11-09 DIAGNOSIS — T63441D Toxic effect of venom of bees, accidental (unintentional), subsequent encounter: Secondary | ICD-10-CM

## 2015-11-09 DIAGNOSIS — IMO0001 Reserved for inherently not codable concepts without codable children: Secondary | ICD-10-CM

## 2015-12-07 ENCOUNTER — Ambulatory Visit (INDEPENDENT_AMBULATORY_CARE_PROVIDER_SITE_OTHER): Payer: Medicare Other | Admitting: *Deleted

## 2015-12-07 DIAGNOSIS — T63441D Toxic effect of venom of bees, accidental (unintentional), subsequent encounter: Secondary | ICD-10-CM

## 2015-12-07 DIAGNOSIS — IMO0001 Reserved for inherently not codable concepts without codable children: Secondary | ICD-10-CM

## 2016-01-04 ENCOUNTER — Ambulatory Visit (INDEPENDENT_AMBULATORY_CARE_PROVIDER_SITE_OTHER): Payer: Medicare Other | Admitting: *Deleted

## 2016-01-04 DIAGNOSIS — T63441D Toxic effect of venom of bees, accidental (unintentional), subsequent encounter: Secondary | ICD-10-CM | POA: Diagnosis not present

## 2016-01-04 DIAGNOSIS — IMO0001 Reserved for inherently not codable concepts without codable children: Secondary | ICD-10-CM

## 2016-02-01 ENCOUNTER — Ambulatory Visit (INDEPENDENT_AMBULATORY_CARE_PROVIDER_SITE_OTHER): Payer: Medicare Other | Admitting: *Deleted

## 2016-02-01 DIAGNOSIS — T63441D Toxic effect of venom of bees, accidental (unintentional), subsequent encounter: Secondary | ICD-10-CM | POA: Diagnosis not present

## 2016-02-01 DIAGNOSIS — IMO0001 Reserved for inherently not codable concepts without codable children: Secondary | ICD-10-CM

## 2016-02-07 DIAGNOSIS — Z125 Encounter for screening for malignant neoplasm of prostate: Secondary | ICD-10-CM | POA: Diagnosis not present

## 2016-02-07 DIAGNOSIS — Z0000 Encounter for general adult medical examination without abnormal findings: Secondary | ICD-10-CM | POA: Diagnosis not present

## 2016-02-07 DIAGNOSIS — Z79899 Other long term (current) drug therapy: Secondary | ICD-10-CM | POA: Diagnosis not present

## 2016-02-15 DIAGNOSIS — Z122 Encounter for screening for malignant neoplasm of respiratory organs: Secondary | ICD-10-CM | POA: Diagnosis not present

## 2016-02-15 DIAGNOSIS — M5134 Other intervertebral disc degeneration, thoracic region: Secondary | ICD-10-CM | POA: Diagnosis not present

## 2016-02-15 DIAGNOSIS — I700 Atherosclerosis of aorta: Secondary | ICD-10-CM | POA: Diagnosis not present

## 2016-02-15 DIAGNOSIS — Z87891 Personal history of nicotine dependence: Secondary | ICD-10-CM | POA: Diagnosis not present

## 2016-02-15 DIAGNOSIS — R918 Other nonspecific abnormal finding of lung field: Secondary | ICD-10-CM | POA: Diagnosis not present

## 2016-02-15 DIAGNOSIS — I2510 Atherosclerotic heart disease of native coronary artery without angina pectoris: Secondary | ICD-10-CM | POA: Diagnosis not present

## 2016-02-17 DIAGNOSIS — J111 Influenza due to unidentified influenza virus with other respiratory manifestations: Secondary | ICD-10-CM | POA: Diagnosis not present

## 2016-02-27 DIAGNOSIS — F1120 Opioid dependence, uncomplicated: Secondary | ICD-10-CM | POA: Diagnosis not present

## 2016-02-27 DIAGNOSIS — M1612 Unilateral primary osteoarthritis, left hip: Secondary | ICD-10-CM | POA: Diagnosis not present

## 2016-02-27 DIAGNOSIS — Z79891 Long term (current) use of opiate analgesic: Secondary | ICD-10-CM | POA: Diagnosis not present

## 2016-02-27 DIAGNOSIS — G894 Chronic pain syndrome: Secondary | ICD-10-CM | POA: Diagnosis not present

## 2016-02-27 DIAGNOSIS — M961 Postlaminectomy syndrome, not elsewhere classified: Secondary | ICD-10-CM | POA: Diagnosis not present

## 2016-02-27 DIAGNOSIS — G4733 Obstructive sleep apnea (adult) (pediatric): Secondary | ICD-10-CM | POA: Diagnosis not present

## 2016-02-27 DIAGNOSIS — M5416 Radiculopathy, lumbar region: Secondary | ICD-10-CM | POA: Diagnosis not present

## 2016-03-07 ENCOUNTER — Ambulatory Visit (INDEPENDENT_AMBULATORY_CARE_PROVIDER_SITE_OTHER): Payer: Medicare HMO | Admitting: *Deleted

## 2016-03-07 DIAGNOSIS — T63441D Toxic effect of venom of bees, accidental (unintentional), subsequent encounter: Secondary | ICD-10-CM

## 2016-03-07 DIAGNOSIS — IMO0001 Reserved for inherently not codable concepts without codable children: Secondary | ICD-10-CM

## 2016-03-25 ENCOUNTER — Ambulatory Visit: Payer: Medicare Other | Admitting: Adult Health

## 2016-03-25 DIAGNOSIS — J189 Pneumonia, unspecified organism: Secondary | ICD-10-CM | POA: Diagnosis not present

## 2016-03-25 DIAGNOSIS — J09X2 Influenza due to identified novel influenza A virus with other respiratory manifestations: Secondary | ICD-10-CM | POA: Diagnosis not present

## 2016-03-25 DIAGNOSIS — R0602 Shortness of breath: Secondary | ICD-10-CM | POA: Diagnosis not present

## 2016-03-25 DIAGNOSIS — J111 Influenza due to unidentified influenza virus with other respiratory manifestations: Secondary | ICD-10-CM | POA: Diagnosis not present

## 2016-03-25 DIAGNOSIS — R0603 Acute respiratory distress: Secondary | ICD-10-CM | POA: Diagnosis not present

## 2016-03-25 DIAGNOSIS — J101 Influenza due to other identified influenza virus with other respiratory manifestations: Secondary | ICD-10-CM | POA: Diagnosis not present

## 2016-04-01 DIAGNOSIS — G894 Chronic pain syndrome: Secondary | ICD-10-CM | POA: Diagnosis not present

## 2016-04-01 DIAGNOSIS — Z79891 Long term (current) use of opiate analgesic: Secondary | ICD-10-CM | POA: Diagnosis not present

## 2016-04-01 DIAGNOSIS — M1612 Unilateral primary osteoarthritis, left hip: Secondary | ICD-10-CM | POA: Diagnosis not present

## 2016-04-01 DIAGNOSIS — F1120 Opioid dependence, uncomplicated: Secondary | ICD-10-CM | POA: Diagnosis not present

## 2016-04-01 DIAGNOSIS — M961 Postlaminectomy syndrome, not elsewhere classified: Secondary | ICD-10-CM | POA: Diagnosis not present

## 2016-04-01 DIAGNOSIS — M5416 Radiculopathy, lumbar region: Secondary | ICD-10-CM | POA: Diagnosis not present

## 2016-04-01 DIAGNOSIS — G4733 Obstructive sleep apnea (adult) (pediatric): Secondary | ICD-10-CM | POA: Diagnosis not present

## 2016-04-11 ENCOUNTER — Ambulatory Visit (INDEPENDENT_AMBULATORY_CARE_PROVIDER_SITE_OTHER): Payer: Medicare HMO | Admitting: *Deleted

## 2016-04-11 DIAGNOSIS — T63441D Toxic effect of venom of bees, accidental (unintentional), subsequent encounter: Secondary | ICD-10-CM | POA: Diagnosis not present

## 2016-04-11 DIAGNOSIS — IMO0001 Reserved for inherently not codable concepts without codable children: Secondary | ICD-10-CM

## 2016-04-19 DIAGNOSIS — G4733 Obstructive sleep apnea (adult) (pediatric): Secondary | ICD-10-CM | POA: Diagnosis not present

## 2016-04-25 ENCOUNTER — Ambulatory Visit (INDEPENDENT_AMBULATORY_CARE_PROVIDER_SITE_OTHER): Payer: Medicare HMO | Admitting: Allergy and Immunology

## 2016-04-25 ENCOUNTER — Encounter: Payer: Self-pay | Admitting: Allergy and Immunology

## 2016-04-25 VITALS — BP 132/88 | HR 76 | Resp 20

## 2016-04-25 DIAGNOSIS — T63441D Toxic effect of venom of bees, accidental (unintentional), subsequent encounter: Secondary | ICD-10-CM | POA: Diagnosis not present

## 2016-04-25 MED ORDER — EPINEPHRINE 0.3 MG/0.3ML IJ SOAJ: 0.3000 mg | Freq: Once | INTRAMUSCULAR | 3 refills | Status: AC

## 2016-04-25 NOTE — Patient Instructions (Signed)
  1.  Continue immunotherapy (and EpiPen) ° °2.  Return to clinic in 1 year or earlier if problem °

## 2016-04-25 NOTE — Progress Notes (Signed)
Follow-up Note  Referring Provider: Angelina Sheriff, MD Primary Provider: Angelina Sheriff., MD Date of Office Visit: 04/25/2016  Subjective:   David Fitzgerald (DOB: 11/13/1960) is a 56 y.o. male who returns to the [facility] on 04/25/2016 in re-evaluation of the following:  HPI: David Fitzgerald presents to this clinic in reevaluation of his hymenoptera venom hypersensitivity state treated with immunotherapy. I last saw him in this clinic one year ago.  He continues on immunotherapy presently every 4 weeks. He has been stung in the field by wasp and has not had a reaction. He has not had any adverse reaction from using his immunotherapy.  He did have a flu vaccine this year but unfortunately also contracted influenza on 2 occasions treated both times with Tamiflu.  Allergies as of 04/25/2016      Reactions   Penicillins       Medication List      BENADRYL ALLERGY PO Take by mouth as needed.   DULoxetine 60 MG capsule Commonly known as:  CYMBALTA Take 1 capsule by mouth daily.   EPINEPHrine 0.3 mg/0.3 mL Soaj injection Commonly known as:  EPIPEN 2-PAK Inject 0.3 mLs (0.3 mg total) into the muscle once.   Fish Oil 1000 MG Caps Take 1 capsule by mouth daily.   fluticasone 50 MCG/ACT nasal spray Commonly known as:  FLONASE Place 2 sprays into both nostrils daily. Reported on 04/26/2015   furosemide 40 MG tablet Commonly known as:  LASIX Take 40 mg by mouth.   gabapentin 300 MG capsule Commonly known as:  NEURONTIN Take 600 mg by mouth 3 (three) times daily.   lisinopril 40 MG tablet Commonly known as:  PRINIVIL,ZESTRIL Take 40 mg by mouth daily.   losartan 50 MG tablet Commonly known as:  COZAAR Take 50 mg by mouth daily.   metoprolol tartrate 25 mg/10 mL Susp Commonly known as:  LOPRESSOR Take 25 mg by mouth 2 (two) times daily.   MIXED VESPID VENOM PROTEIN [place] Inject into the skin.   NITROSTAT 0.4 MG SL tablet Generic drug:   nitroGLYCERIN Reported on 04/26/2015   oxyCODONE-acetaminophen 7.5-325 MG tablet Commonly known as:  PERCOCET Take 1 tablet by mouth every 8 (eight) hours as needed for severe pain.   simvastatin 40 MG tablet Commonly known as:  ZOCOR Take 40 mg by mouth daily.   VENOMIL WASP VENOM IJ Inject as directed.       Past Medical History:  Diagnosis Date  . Bee sting allergy    wasp / mixed vespid  . Diabetes ([place])   . Hypertension     Past Surgical History:  Procedure Laterality Date  . ADENOIDECTOMY    . BACK SURGERY  2002 /2003  . KNEE SURGERY Left 2005  . SHOULDER SURGERY Left 1998  . TONSILLECTOMY      Review of systems negative except as noted in HPI / PMHx or noted below:  Review of Systems  Constitutional: Negative.   HENT: Negative.   Eyes: Negative.   Respiratory: Negative.   Cardiovascular: Negative.   Gastrointestinal: Negative.   Genitourinary: Negative.   Musculoskeletal: Negative.   Skin: Negative.   Neurological: Negative.   Endo/Heme/Allergies: Negative.   Psychiatric/Behavioral: Negative.      Objective:   Vitals:   04/25/16 1455  BP: 132/88  Pulse: 76  Resp: 20          Physical Exam  Constitutional: He is well-developed, well-nourished, and in no  distress.  HENT:  Head: Normocephalic.  Right Ear: Tympanic membrane, external ear and ear canal normal.  Left Ear: Tympanic membrane, external ear and ear canal normal.  Nose: Nose normal. No mucosal edema or rhinorrhea.  Mouth/Throat: Uvula is midline, oropharynx is clear and moist and mucous membranes are normal. No oropharyngeal exudate.  Eyes: Conjunctivae are normal.  Neck: Trachea normal. No tracheal tenderness present. No tracheal deviation present. No thyromegaly present.  Cardiovascular: Normal rate, regular rhythm, S1 normal, S2 normal and normal heart sounds.   No murmur heard. Pulmonary/Chest: Breath sounds normal. No stridor. No respiratory distress. He has no wheezes. He  has no rales.  Musculoskeletal: He exhibits no edema.  Lymphadenopathy:       Head (right side): No tonsillar adenopathy present.       Head (left side): No tonsillar adenopathy present.    He has no cervical adenopathy.  Neurological: He is alert. Gait normal.  Skin: No rash noted. He is not diaphoretic. No erythema. Nails show no clubbing.  Psychiatric: Mood and affect normal.    Diagnostics: none   Assessment and Plan:   1. Bee sting reaction, accidental or unintentional, subsequent encounter     1. Continue immunotherapy and EpiPen  2. Return to clinic in 1 year or earlier if problem   David Fitzgerald appears to be doing quite well on his immunotherapy and he will continue this form of treatment and I will see him back in this clinic in 1 year or earlier if there is a problem.  David Katz, MD [facility] [place]

## 2016-04-30 DIAGNOSIS — M1612 Unilateral primary osteoarthritis, left hip: Secondary | ICD-10-CM | POA: Diagnosis not present

## 2016-04-30 DIAGNOSIS — M5416 Radiculopathy, lumbar region: Secondary | ICD-10-CM | POA: Diagnosis not present

## 2016-04-30 DIAGNOSIS — M961 Postlaminectomy syndrome, not elsewhere classified: Secondary | ICD-10-CM | POA: Diagnosis not present

## 2016-04-30 DIAGNOSIS — G894 Chronic pain syndrome: Secondary | ICD-10-CM | POA: Diagnosis not present

## 2016-04-30 DIAGNOSIS — Z79891 Long term (current) use of opiate analgesic: Secondary | ICD-10-CM | POA: Diagnosis not present

## 2016-04-30 DIAGNOSIS — G4733 Obstructive sleep apnea (adult) (pediatric): Secondary | ICD-10-CM | POA: Diagnosis not present

## 2016-04-30 DIAGNOSIS — F1120 Opioid dependence, uncomplicated: Secondary | ICD-10-CM | POA: Diagnosis not present

## 2016-05-09 DIAGNOSIS — L0390 Cellulitis, unspecified: Secondary | ICD-10-CM | POA: Diagnosis not present

## 2016-05-13 DIAGNOSIS — L0390 Cellulitis, unspecified: Secondary | ICD-10-CM | POA: Diagnosis not present

## 2016-05-16 DIAGNOSIS — Z0000 Encounter for general adult medical examination without abnormal findings: Secondary | ICD-10-CM | POA: Diagnosis not present

## 2016-05-16 DIAGNOSIS — I10 Essential (primary) hypertension: Secondary | ICD-10-CM | POA: Diagnosis not present

## 2016-05-16 DIAGNOSIS — L03116 Cellulitis of left lower limb: Secondary | ICD-10-CM | POA: Diagnosis not present

## 2016-05-16 DIAGNOSIS — Z6841 Body Mass Index (BMI) 40.0 and over, adult: Secondary | ICD-10-CM | POA: Diagnosis not present

## 2016-05-17 DIAGNOSIS — M7989 Other specified soft tissue disorders: Secondary | ICD-10-CM | POA: Diagnosis not present

## 2016-05-17 DIAGNOSIS — G4733 Obstructive sleep apnea (adult) (pediatric): Secondary | ICD-10-CM | POA: Diagnosis not present

## 2016-05-17 DIAGNOSIS — L97422 Non-pressure chronic ulcer of left heel and midfoot with fat layer exposed: Secondary | ICD-10-CM | POA: Diagnosis not present

## 2016-05-17 DIAGNOSIS — F419 Anxiety disorder, unspecified: Secondary | ICD-10-CM | POA: Diagnosis not present

## 2016-05-17 DIAGNOSIS — M1990 Unspecified osteoarthritis, unspecified site: Secondary | ICD-10-CM | POA: Diagnosis not present

## 2016-05-17 DIAGNOSIS — Z6841 Body Mass Index (BMI) 40.0 and over, adult: Secondary | ICD-10-CM | POA: Diagnosis not present

## 2016-05-17 DIAGNOSIS — L03116 Cellulitis of left lower limb: Secondary | ICD-10-CM | POA: Diagnosis not present

## 2016-05-17 DIAGNOSIS — E1143 Type 2 diabetes mellitus with diabetic autonomic (poly)neuropathy: Secondary | ICD-10-CM | POA: Diagnosis not present

## 2016-05-17 DIAGNOSIS — I10 Essential (primary) hypertension: Secondary | ICD-10-CM | POA: Diagnosis not present

## 2016-05-17 DIAGNOSIS — E7800 Pure hypercholesterolemia, unspecified: Secondary | ICD-10-CM | POA: Diagnosis not present

## 2016-05-17 DIAGNOSIS — M79662 Pain in left lower leg: Secondary | ICD-10-CM | POA: Diagnosis not present

## 2016-05-20 DIAGNOSIS — I10 Essential (primary) hypertension: Secondary | ICD-10-CM | POA: Diagnosis not present

## 2016-05-20 DIAGNOSIS — L03116 Cellulitis of left lower limb: Secondary | ICD-10-CM | POA: Diagnosis not present

## 2016-05-20 DIAGNOSIS — F419 Anxiety disorder, unspecified: Secondary | ICD-10-CM | POA: Diagnosis not present

## 2016-05-20 DIAGNOSIS — E7800 Pure hypercholesterolemia, unspecified: Secondary | ICD-10-CM | POA: Diagnosis not present

## 2016-05-20 DIAGNOSIS — G4733 Obstructive sleep apnea (adult) (pediatric): Secondary | ICD-10-CM | POA: Diagnosis not present

## 2016-05-20 DIAGNOSIS — M1990 Unspecified osteoarthritis, unspecified site: Secondary | ICD-10-CM | POA: Diagnosis not present

## 2016-05-20 DIAGNOSIS — Z6841 Body Mass Index (BMI) 40.0 and over, adult: Secondary | ICD-10-CM | POA: Diagnosis not present

## 2016-05-20 DIAGNOSIS — E1143 Type 2 diabetes mellitus with diabetic autonomic (poly)neuropathy: Secondary | ICD-10-CM | POA: Diagnosis not present

## 2016-05-22 DIAGNOSIS — E7800 Pure hypercholesterolemia, unspecified: Secondary | ICD-10-CM | POA: Diagnosis not present

## 2016-05-22 DIAGNOSIS — L03116 Cellulitis of left lower limb: Secondary | ICD-10-CM | POA: Diagnosis not present

## 2016-05-22 DIAGNOSIS — M1990 Unspecified osteoarthritis, unspecified site: Secondary | ICD-10-CM | POA: Diagnosis not present

## 2016-05-22 DIAGNOSIS — E1143 Type 2 diabetes mellitus with diabetic autonomic (poly)neuropathy: Secondary | ICD-10-CM | POA: Diagnosis not present

## 2016-05-22 DIAGNOSIS — F419 Anxiety disorder, unspecified: Secondary | ICD-10-CM | POA: Diagnosis not present

## 2016-05-22 DIAGNOSIS — I10 Essential (primary) hypertension: Secondary | ICD-10-CM | POA: Diagnosis not present

## 2016-05-22 DIAGNOSIS — G4733 Obstructive sleep apnea (adult) (pediatric): Secondary | ICD-10-CM | POA: Diagnosis not present

## 2016-05-22 DIAGNOSIS — Z6841 Body Mass Index (BMI) 40.0 and over, adult: Secondary | ICD-10-CM | POA: Diagnosis not present

## 2016-05-24 DIAGNOSIS — Z09 Encounter for follow-up examination after completed treatment for conditions other than malignant neoplasm: Secondary | ICD-10-CM | POA: Diagnosis not present

## 2016-05-24 DIAGNOSIS — L97429 Non-pressure chronic ulcer of left heel and midfoot with unspecified severity: Secondary | ICD-10-CM | POA: Diagnosis not present

## 2016-05-28 DIAGNOSIS — G4733 Obstructive sleep apnea (adult) (pediatric): Secondary | ICD-10-CM | POA: Diagnosis not present

## 2016-05-28 DIAGNOSIS — M1612 Unilateral primary osteoarthritis, left hip: Secondary | ICD-10-CM | POA: Diagnosis not present

## 2016-05-28 DIAGNOSIS — M961 Postlaminectomy syndrome, not elsewhere classified: Secondary | ICD-10-CM | POA: Diagnosis not present

## 2016-05-28 DIAGNOSIS — F1120 Opioid dependence, uncomplicated: Secondary | ICD-10-CM | POA: Diagnosis not present

## 2016-05-28 DIAGNOSIS — Z79891 Long term (current) use of opiate analgesic: Secondary | ICD-10-CM | POA: Diagnosis not present

## 2016-05-28 DIAGNOSIS — G894 Chronic pain syndrome: Secondary | ICD-10-CM | POA: Diagnosis not present

## 2016-05-28 DIAGNOSIS — M5416 Radiculopathy, lumbar region: Secondary | ICD-10-CM | POA: Diagnosis not present

## 2016-05-30 ENCOUNTER — Ambulatory Visit (INDEPENDENT_AMBULATORY_CARE_PROVIDER_SITE_OTHER): Payer: Medicare HMO | Admitting: *Deleted

## 2016-05-30 DIAGNOSIS — T63441D Toxic effect of venom of bees, accidental (unintentional), subsequent encounter: Secondary | ICD-10-CM

## 2016-06-06 DIAGNOSIS — L03116 Cellulitis of left lower limb: Secondary | ICD-10-CM | POA: Diagnosis not present

## 2016-07-03 DIAGNOSIS — G894 Chronic pain syndrome: Secondary | ICD-10-CM | POA: Diagnosis not present

## 2016-07-03 DIAGNOSIS — M961 Postlaminectomy syndrome, not elsewhere classified: Secondary | ICD-10-CM | POA: Diagnosis not present

## 2016-07-03 DIAGNOSIS — M5416 Radiculopathy, lumbar region: Secondary | ICD-10-CM | POA: Diagnosis not present

## 2016-07-03 DIAGNOSIS — F1120 Opioid dependence, uncomplicated: Secondary | ICD-10-CM | POA: Diagnosis not present

## 2016-07-03 DIAGNOSIS — Z79891 Long term (current) use of opiate analgesic: Secondary | ICD-10-CM | POA: Diagnosis not present

## 2016-07-03 DIAGNOSIS — G4733 Obstructive sleep apnea (adult) (pediatric): Secondary | ICD-10-CM | POA: Diagnosis not present

## 2016-07-03 DIAGNOSIS — M1612 Unilateral primary osteoarthritis, left hip: Secondary | ICD-10-CM | POA: Diagnosis not present

## 2016-07-11 ENCOUNTER — Ambulatory Visit (INDEPENDENT_AMBULATORY_CARE_PROVIDER_SITE_OTHER): Payer: Medicare HMO | Admitting: *Deleted

## 2016-07-11 DIAGNOSIS — T63441D Toxic effect of venom of bees, accidental (unintentional), subsequent encounter: Secondary | ICD-10-CM | POA: Diagnosis not present

## 2016-07-11 DIAGNOSIS — IMO0001 Reserved for inherently not codable concepts without codable children: Secondary | ICD-10-CM

## 2016-07-29 DIAGNOSIS — F1120 Opioid dependence, uncomplicated: Secondary | ICD-10-CM | POA: Diagnosis not present

## 2016-07-29 DIAGNOSIS — M961 Postlaminectomy syndrome, not elsewhere classified: Secondary | ICD-10-CM | POA: Diagnosis not present

## 2016-07-29 DIAGNOSIS — G4733 Obstructive sleep apnea (adult) (pediatric): Secondary | ICD-10-CM | POA: Diagnosis not present

## 2016-07-29 DIAGNOSIS — M5416 Radiculopathy, lumbar region: Secondary | ICD-10-CM | POA: Diagnosis not present

## 2016-07-29 DIAGNOSIS — G894 Chronic pain syndrome: Secondary | ICD-10-CM | POA: Diagnosis not present

## 2016-07-29 DIAGNOSIS — Z79891 Long term (current) use of opiate analgesic: Secondary | ICD-10-CM | POA: Diagnosis not present

## 2016-07-29 DIAGNOSIS — M1612 Unilateral primary osteoarthritis, left hip: Secondary | ICD-10-CM | POA: Diagnosis not present

## 2016-08-15 ENCOUNTER — Ambulatory Visit (INDEPENDENT_AMBULATORY_CARE_PROVIDER_SITE_OTHER): Payer: Medicare HMO

## 2016-08-15 DIAGNOSIS — T63441D Toxic effect of venom of bees, accidental (unintentional), subsequent encounter: Secondary | ICD-10-CM

## 2016-08-15 DIAGNOSIS — IMO0001 Reserved for inherently not codable concepts without codable children: Secondary | ICD-10-CM

## 2016-08-26 DIAGNOSIS — F1120 Opioid dependence, uncomplicated: Secondary | ICD-10-CM | POA: Diagnosis not present

## 2016-08-26 DIAGNOSIS — Z79891 Long term (current) use of opiate analgesic: Secondary | ICD-10-CM | POA: Diagnosis not present

## 2016-08-26 DIAGNOSIS — M5416 Radiculopathy, lumbar region: Secondary | ICD-10-CM | POA: Diagnosis not present

## 2016-08-26 DIAGNOSIS — M961 Postlaminectomy syndrome, not elsewhere classified: Secondary | ICD-10-CM | POA: Diagnosis not present

## 2016-08-26 DIAGNOSIS — G4733 Obstructive sleep apnea (adult) (pediatric): Secondary | ICD-10-CM | POA: Diagnosis not present

## 2016-08-26 DIAGNOSIS — G894 Chronic pain syndrome: Secondary | ICD-10-CM | POA: Diagnosis not present

## 2016-08-26 DIAGNOSIS — M1612 Unilateral primary osteoarthritis, left hip: Secondary | ICD-10-CM | POA: Diagnosis not present

## 2016-09-26 ENCOUNTER — Ambulatory Visit (INDEPENDENT_AMBULATORY_CARE_PROVIDER_SITE_OTHER): Payer: Medicare HMO | Admitting: *Deleted

## 2016-09-26 DIAGNOSIS — T63441D Toxic effect of venom of bees, accidental (unintentional), subsequent encounter: Secondary | ICD-10-CM

## 2016-09-26 DIAGNOSIS — IMO0001 Reserved for inherently not codable concepts without codable children: Secondary | ICD-10-CM

## 2016-10-01 DIAGNOSIS — M961 Postlaminectomy syndrome, not elsewhere classified: Secondary | ICD-10-CM | POA: Diagnosis not present

## 2016-10-01 DIAGNOSIS — M1612 Unilateral primary osteoarthritis, left hip: Secondary | ICD-10-CM | POA: Diagnosis not present

## 2016-10-01 DIAGNOSIS — G894 Chronic pain syndrome: Secondary | ICD-10-CM | POA: Diagnosis not present

## 2016-10-01 DIAGNOSIS — G4733 Obstructive sleep apnea (adult) (pediatric): Secondary | ICD-10-CM | POA: Diagnosis not present

## 2016-10-01 DIAGNOSIS — F1120 Opioid dependence, uncomplicated: Secondary | ICD-10-CM | POA: Diagnosis not present

## 2016-10-01 DIAGNOSIS — Z79891 Long term (current) use of opiate analgesic: Secondary | ICD-10-CM | POA: Diagnosis not present

## 2016-10-01 DIAGNOSIS — M5416 Radiculopathy, lumbar region: Secondary | ICD-10-CM | POA: Diagnosis not present

## 2016-10-29 DIAGNOSIS — G4733 Obstructive sleep apnea (adult) (pediatric): Secondary | ICD-10-CM | POA: Diagnosis not present

## 2016-10-29 DIAGNOSIS — F1120 Opioid dependence, uncomplicated: Secondary | ICD-10-CM | POA: Diagnosis not present

## 2016-10-29 DIAGNOSIS — M1612 Unilateral primary osteoarthritis, left hip: Secondary | ICD-10-CM | POA: Diagnosis not present

## 2016-10-29 DIAGNOSIS — G894 Chronic pain syndrome: Secondary | ICD-10-CM | POA: Diagnosis not present

## 2016-10-29 DIAGNOSIS — M961 Postlaminectomy syndrome, not elsewhere classified: Secondary | ICD-10-CM | POA: Diagnosis not present

## 2016-10-29 DIAGNOSIS — Z79891 Long term (current) use of opiate analgesic: Secondary | ICD-10-CM | POA: Diagnosis not present

## 2016-10-29 DIAGNOSIS — M5416 Radiculopathy, lumbar region: Secondary | ICD-10-CM | POA: Diagnosis not present

## 2016-11-07 ENCOUNTER — Ambulatory Visit (INDEPENDENT_AMBULATORY_CARE_PROVIDER_SITE_OTHER): Payer: Medicare HMO | Admitting: *Deleted

## 2016-11-07 DIAGNOSIS — T63441D Toxic effect of venom of bees, accidental (unintentional), subsequent encounter: Secondary | ICD-10-CM | POA: Diagnosis not present

## 2016-11-07 DIAGNOSIS — IMO0001 Reserved for inherently not codable concepts without codable children: Secondary | ICD-10-CM

## 2016-11-26 DIAGNOSIS — G4733 Obstructive sleep apnea (adult) (pediatric): Secondary | ICD-10-CM | POA: Diagnosis not present

## 2016-11-26 DIAGNOSIS — M5416 Radiculopathy, lumbar region: Secondary | ICD-10-CM | POA: Diagnosis not present

## 2016-11-26 DIAGNOSIS — M1612 Unilateral primary osteoarthritis, left hip: Secondary | ICD-10-CM | POA: Diagnosis not present

## 2016-11-26 DIAGNOSIS — Z79891 Long term (current) use of opiate analgesic: Secondary | ICD-10-CM | POA: Diagnosis not present

## 2016-11-26 DIAGNOSIS — M961 Postlaminectomy syndrome, not elsewhere classified: Secondary | ICD-10-CM | POA: Diagnosis not present

## 2016-11-26 DIAGNOSIS — E1165 Type 2 diabetes mellitus with hyperglycemia: Secondary | ICD-10-CM | POA: Diagnosis not present

## 2016-11-26 DIAGNOSIS — F1120 Opioid dependence, uncomplicated: Secondary | ICD-10-CM | POA: Diagnosis not present

## 2016-11-26 DIAGNOSIS — G894 Chronic pain syndrome: Secondary | ICD-10-CM | POA: Diagnosis not present

## 2016-12-05 DIAGNOSIS — Z1339 Encounter for screening examination for other mental health and behavioral disorders: Secondary | ICD-10-CM | POA: Diagnosis not present

## 2016-12-05 DIAGNOSIS — I10 Essential (primary) hypertension: Secondary | ICD-10-CM | POA: Diagnosis not present

## 2016-12-05 DIAGNOSIS — E1143 Type 2 diabetes mellitus with diabetic autonomic (poly)neuropathy: Secondary | ICD-10-CM | POA: Diagnosis not present

## 2016-12-05 DIAGNOSIS — Z634 Disappearance and death of family member: Secondary | ICD-10-CM | POA: Diagnosis not present

## 2016-12-05 DIAGNOSIS — Z1331 Encounter for screening for depression: Secondary | ICD-10-CM | POA: Diagnosis not present

## 2016-12-05 DIAGNOSIS — E7800 Pure hypercholesterolemia, unspecified: Secondary | ICD-10-CM | POA: Diagnosis not present

## 2016-12-05 DIAGNOSIS — F4320 Adjustment disorder, unspecified: Secondary | ICD-10-CM | POA: Diagnosis not present

## 2016-12-05 DIAGNOSIS — Z6841 Body Mass Index (BMI) 40.0 and over, adult: Secondary | ICD-10-CM | POA: Diagnosis not present

## 2016-12-05 DIAGNOSIS — Z23 Encounter for immunization: Secondary | ICD-10-CM | POA: Diagnosis not present

## 2016-12-19 ENCOUNTER — Ambulatory Visit (INDEPENDENT_AMBULATORY_CARE_PROVIDER_SITE_OTHER): Payer: Medicare HMO | Admitting: *Deleted

## 2016-12-19 DIAGNOSIS — T63441D Toxic effect of venom of bees, accidental (unintentional), subsequent encounter: Secondary | ICD-10-CM | POA: Diagnosis not present

## 2016-12-19 DIAGNOSIS — IMO0001 Reserved for inherently not codable concepts without codable children: Secondary | ICD-10-CM

## 2016-12-31 DIAGNOSIS — M5416 Radiculopathy, lumbar region: Secondary | ICD-10-CM | POA: Diagnosis not present

## 2016-12-31 DIAGNOSIS — M1612 Unilateral primary osteoarthritis, left hip: Secondary | ICD-10-CM | POA: Diagnosis not present

## 2016-12-31 DIAGNOSIS — G894 Chronic pain syndrome: Secondary | ICD-10-CM | POA: Diagnosis not present

## 2016-12-31 DIAGNOSIS — M961 Postlaminectomy syndrome, not elsewhere classified: Secondary | ICD-10-CM | POA: Diagnosis not present

## 2016-12-31 DIAGNOSIS — Z79891 Long term (current) use of opiate analgesic: Secondary | ICD-10-CM | POA: Diagnosis not present

## 2016-12-31 DIAGNOSIS — G4733 Obstructive sleep apnea (adult) (pediatric): Secondary | ICD-10-CM | POA: Diagnosis not present

## 2016-12-31 DIAGNOSIS — F1120 Opioid dependence, uncomplicated: Secondary | ICD-10-CM | POA: Diagnosis not present

## 2017-01-17 DIAGNOSIS — H5203 Hypermetropia, bilateral: Secondary | ICD-10-CM | POA: Diagnosis not present

## 2017-01-17 DIAGNOSIS — E119 Type 2 diabetes mellitus without complications: Secondary | ICD-10-CM | POA: Diagnosis not present

## 2017-01-23 DIAGNOSIS — M961 Postlaminectomy syndrome, not elsewhere classified: Secondary | ICD-10-CM | POA: Diagnosis not present

## 2017-01-23 DIAGNOSIS — G894 Chronic pain syndrome: Secondary | ICD-10-CM | POA: Diagnosis not present

## 2017-01-23 DIAGNOSIS — Z79891 Long term (current) use of opiate analgesic: Secondary | ICD-10-CM | POA: Diagnosis not present

## 2017-01-23 DIAGNOSIS — M5416 Radiculopathy, lumbar region: Secondary | ICD-10-CM | POA: Diagnosis not present

## 2017-01-23 DIAGNOSIS — M1612 Unilateral primary osteoarthritis, left hip: Secondary | ICD-10-CM | POA: Diagnosis not present

## 2017-01-23 DIAGNOSIS — G4733 Obstructive sleep apnea (adult) (pediatric): Secondary | ICD-10-CM | POA: Diagnosis not present

## 2017-01-23 DIAGNOSIS — F1120 Opioid dependence, uncomplicated: Secondary | ICD-10-CM | POA: Diagnosis not present

## 2017-01-30 ENCOUNTER — Ambulatory Visit (INDEPENDENT_AMBULATORY_CARE_PROVIDER_SITE_OTHER): Payer: Medicare HMO | Admitting: *Deleted

## 2017-01-30 DIAGNOSIS — IMO0001 Reserved for inherently not codable concepts without codable children: Secondary | ICD-10-CM

## 2017-01-30 DIAGNOSIS — T63441D Toxic effect of venom of bees, accidental (unintentional), subsequent encounter: Secondary | ICD-10-CM | POA: Diagnosis not present

## 2017-02-03 ENCOUNTER — Encounter: Payer: Self-pay | Admitting: Cardiology

## 2017-02-03 ENCOUNTER — Encounter: Payer: Self-pay | Admitting: *Deleted

## 2017-02-03 DIAGNOSIS — D124 Benign neoplasm of descending colon: Secondary | ICD-10-CM | POA: Diagnosis not present

## 2017-02-03 DIAGNOSIS — D126 Benign neoplasm of colon, unspecified: Secondary | ICD-10-CM | POA: Diagnosis not present

## 2017-02-03 DIAGNOSIS — K635 Polyp of colon: Secondary | ICD-10-CM | POA: Diagnosis not present

## 2017-02-03 DIAGNOSIS — D128 Benign neoplasm of rectum: Secondary | ICD-10-CM | POA: Diagnosis not present

## 2017-02-03 DIAGNOSIS — D123 Benign neoplasm of transverse colon: Secondary | ICD-10-CM | POA: Diagnosis not present

## 2017-02-03 DIAGNOSIS — Z1211 Encounter for screening for malignant neoplasm of colon: Secondary | ICD-10-CM | POA: Diagnosis not present

## 2017-02-03 DIAGNOSIS — Z86010 Personal history of colonic polyps: Secondary | ICD-10-CM | POA: Diagnosis not present

## 2017-02-05 ENCOUNTER — Encounter: Payer: Self-pay | Admitting: *Deleted

## 2017-02-06 DIAGNOSIS — M1990 Unspecified osteoarthritis, unspecified site: Secondary | ICD-10-CM

## 2017-02-06 DIAGNOSIS — D510 Vitamin B12 deficiency anemia due to intrinsic factor deficiency: Secondary | ICD-10-CM

## 2017-02-06 DIAGNOSIS — I4819 Other persistent atrial fibrillation: Secondary | ICD-10-CM | POA: Insufficient documentation

## 2017-02-06 DIAGNOSIS — F419 Anxiety disorder, unspecified: Secondary | ICD-10-CM

## 2017-02-06 HISTORY — DX: Vitamin B12 deficiency anemia due to intrinsic factor deficiency: D51.0

## 2017-02-06 HISTORY — DX: Anxiety disorder, unspecified: F41.9

## 2017-02-06 HISTORY — DX: Unspecified osteoarthritis, unspecified site: M19.90

## 2017-02-06 HISTORY — DX: Other persistent atrial fibrillation: I48.19

## 2017-02-06 NOTE — Progress Notes (Signed)
Cardiology Office Note:    Date:  02/07/2017   ID:  David Fitzgerald, DOB [birth year], MRN [id number]  PCP:  Angelina Sheriff, MD  Cardiologist:  Shirlee More, MD   Referring MD: Angelina Sheriff, MD  ASSESSMENT:    1. Atrial fibrillation, unspecified type ([place])   2. OSA treated with BiPAP   3. Morbid obesity with BMI of 45.0-49.9, adult ([place])   4. Essential hypertension    PLAN:    In order of problems listed above:  1. His atrial fibrillation is age-indeterminate asymptomatic and is not controlled rate on his beta-blocker.  At this time we will not attempt to resume sinus rhythm as he is asymptomatic.  He has a moderate stroke risk chads 2 score of 2 and will start anticoagulation with Eliquis.  I asked him for further evaluation to have a Holter monitor to assess his heart rate response and seems to be appropriate on beta-blocker and echocardiogram to assess structural heart disease I encouraged him to continue to lose weight which is helpful with atrial fibrillation and to resume CPAP therapy for his sleep apnea. 2. He will resume CPAP 3. Encouraged him to continue to lose weight 4. Continue current treatment including ARB and beta-blocker.  Next appointment 4 weeks   Medication Adjustments/Labs and Tests Ordered: Current medicines are reviewed at length with the patient today.  Concerns regarding medicines are outlined above.  No orders of the defined types were placed in this encounter.  No orders of the defined types were placed in this encounter.    Chief Complaint  Patient presents with  . Atrial Fibrillation  4 weeks  History of Present Illness:    David Fitzgerald is a 57 y.o. male who is being seen today for the evaluation of atrial fibrillation CHADS2vasc of 2 at the request of Dr Melina Copa GI in [place]. He was found to be in rate controlled asymptomatic atrial fibrillation during endoscopy 02/03/17 confirmed on EKG. He was unaware of atrial fibrillation and  is not have palpitation no change in his mild chronic exertional dyspnea.  He said in the spring time he was concerned about stress panic was seen in the emergency room and had an EKG that was normal.  He relates I had seen him approximately 3 years ago he has mild one-vessel CAD cardiac catheterization and there is an attempt to open a chronic total occlusion that was unsuccessful.  He is having no anginal discomfort and those records are requested from [facility] hospital.  He does have sleep apnea and he stopped using his CPAP during the summer when he cared for his wife who is dying of liver failure.  He has intentionally lost 40 pounds.  He has had no TIA or syncope and on recent colonoscopy had small polyps that were removed and is not having GI bleeding.  He has no history of congenital rheumatic heart disease or stroke  Past Medical History:  Diagnosis Date  . Allergy to pollen 04/06/2015  . Anxiety 02/06/2017  . Arthritis 02/06/2017  . Bee sting allergy    wasp / mixed vespid  . Bell's palsy 02/24/2013  . Chronic venous insufficiency 12/14/2014  . Diabetes ([place])   . Edema 12/14/2014  . Hyperlipidemia, mixed 12/14/2014  . Hypertension   . Lumbar pain with radiation down left leg 07/09/2012  . Morbid obesity ([place]) 12/14/2014  . Morbid obesity with BMI of 45.0-49.9, adult ([place]) 07/09/2012  . Obesity hypoventilation syndrome (  [place]) 12/14/2014  . OSA treated with BiPAP 12/14/2014  . Pain syndrome, chronic 07/09/2012  . Pernicious anemia 02/06/2017  . Polyneuropathy in diabetes ([place]) 02/24/2013  . Syndrome affecting cervical region 02/24/2013  . Trigeminal neuralgia 02/24/2013  . Type 2 diabetes mellitus with diabetic neuropathy ([place]) 12/14/2014    Past Surgical History:  Procedure Laterality Date  . ADENOIDECTOMY    . BACK SURGERY  2002 /2003  . CARPAL TUNNEL RELEASE Bilateral   . KNEE SURGERY Left 2005  . SHOULDER SURGERY Left 1998  . TONSILLECTOMY      Current Medications: Current Meds    Medication Sig  . amLODipine (NORVASC) 5 MG tablet Take 5 mg by mouth daily.  . DULoxetine (CYMBALTA) 60 MG capsule daily.  Marland Kitchen EPINEPHrine 0.3 mg/0.3 mL IJ SOAJ injection Inject into the muscle once.  . furosemide (LASIX) 40 MG tablet Take 40 mg by mouth daily as needed.   . gabapentin (NEURONTIN) 300 MG capsule Take 600 mg by mouth 3 (three) times daily.   Marland Kitchen ibuprofen (ADVIL,MOTRIN) 200 MG tablet Take 200 mg by mouth every 8 (eight) hours as needed.  Marland Kitchen losartan (COZAAR) 100 MG tablet Take 100 mg by mouth daily.  . metFORMIN (GLUCOPHAGE) 500 MG tablet Take 500 mg by mouth daily with breakfast.  . metoprolol tartrate (LOPRESSOR) 25 MG tablet Take 25 mg by mouth 2 (two) times daily.  Marland Kitchen MIXED VESPID VENOM PROTEIN [place] Inject into the skin.  Marland Kitchen nitroGLYCERIN (NITROSTAT) 0.4 MG SL tablet Reported on 04/26/2015  . NON FORMULARY Hemp Oil  . Omega-3 Fatty Acids (FISH OIL) 1000 MG CAPS Take 1 capsule by mouth daily.  Marland Kitchen oxyCODONE-acetaminophen (PERCOCET) 7.5-325 MG tablet Take 1 tablet by mouth every 8 (eight) hours as needed for severe pain.  . simvastatin (ZOCOR) 20 MG tablet Take 20 mg by mouth daily.  . VENOMIL WASP VENOM IJ Inject as directed.  . [DISCONTINUED] fluticasone (FLONASE) 50 MCG/ACT nasal spray Place 2 sprays into both nostrils daily. Reported on 04/26/2015     Allergies:   Aspirin; Bee venom; and Penicillins   Social History   Socioeconomic History  . Marital status: Married    Spouse name: Not on file  . Number of children: Not on file  . Years of education: Not on file  . Highest education level: Not on file  Social Needs  . Financial resource strain: Not on file  . Food insecurity - worry: Not on file  . Food insecurity - inability: Not on file  . Transportation needs - medical: Not on file  . Transportation needs - non-medical: Not on file  Occupational History  . Not on file  Tobacco Use  . Smoking status: Former Smoker    Packs/day: 3.00    Years: 35.00    Pack  years: 105.00    Types: Cigarettes    Last attempt to quit: 04/13/2006    Years since quitting: 10.8  . Smokeless tobacco: Never Used  Substance and Sexual Activity  . Alcohol use: No    Alcohol/week: 0.0 oz  . Drug use: No  . Sexual activity: Not on file  Other Topics Concern  . Not on file  Social History Narrative  . Not on file     Family History: The patient's family history includes Atrial fibrillation in his father; Breast cancer in his maternal grandmother and mother; CAD in his father; Heart disease in his father and paternal uncle; Stomach cancer in his paternal aunt.  ROS:  Review of Systems  Constitution: Positive for weakness.  HENT: Negative.   Eyes: Negative.   Cardiovascular: Positive for dyspnea on exertion (stable walking outdoors).  Respiratory: Positive for shortness of breath (mild exertional unchanged) and snoring.   Endocrine: Negative.   Hematologic/Lymphatic: Negative.   Skin: Negative.   Musculoskeletal: Positive for back pain and myalgias.  Gastrointestinal: Negative.   Genitourinary: Negative.   Neurological: Negative for aphonia, brief paralysis, difficulty with concentration, disturbances in coordination, excessive daytime sleepiness, dizziness, focal weakness and seizures.  Psychiatric/Behavioral: Negative.   Allergic/Immunologic: Negative.    Please see the history of present illness.     All other systems reviewed and are negative.  EKGs/Labs/Other Studies Reviewed:    The following studies were reviewed today: Records reviewed from his PCP in the endoscopy center including the rhythm strip documenting rate controlled atrial fibrillation  EKG:  EKG is  ordered today.  The ekg ordered today demonstrates atrial fibrillation controlled rate poor R wave progression  Recent Labs: 11/26/16 CMP normal except Glu 132, TSH normal 2.1  Recent Lipid Panel Chol 154, HDL 27 LDL 84   Physical Exam:    VS:  BP 118/74 (BP Location: Right Arm,  Patient Position: Sitting, Cuff Size: Large)   Pulse 81   Ht 6\' 2"  (1.88 m)   Wt (!) 379 lb 1.3 oz (171.9 kg)   SpO2 94%   BMI 48.67 kg/m     Wt Readings from Last 3 Encounters:  02/07/17 (!) 379 lb 1.3 oz (171.9 kg)  09/04/15 (!) 376 lb (170.6 kg)  02/27/15 (!) 386 lb 9.6 oz (175.4 kg)     GEN: He is obese well nourished, well developed in no acute distress HEENT: Normal NECK: No JVD; No carotid bruits LYMPHATICS: No lymphadenopathy CARDIAC: Irregular irregular variable first heart sound  no murmurs, rubs, gallops RESPIRATORY:  Clear to auscultation without rales, wheezing or rhonchi  ABDOMEN: Soft, non-tender, non-distended MUSCULOSKELETAL:  No edema; No deformity  SKIN: Warm and dry NEUROLOGIC:  Alert and oriented x 3 PSYCHIATRIC:  Normal affect     Signed, Shirlee More, MD  02/07/2017 10:41 AM    [place]

## 2017-02-07 ENCOUNTER — Ambulatory Visit (INDEPENDENT_AMBULATORY_CARE_PROVIDER_SITE_OTHER): Payer: Medicare HMO | Admitting: Cardiology

## 2017-02-07 ENCOUNTER — Encounter: Payer: Self-pay | Admitting: Cardiology

## 2017-02-07 VITALS — BP 110/72 | HR 81 | Ht 74.0 in | Wt 379.1 lb

## 2017-02-07 DIAGNOSIS — Z6841 Body Mass Index (BMI) 40.0 and over, adult: Secondary | ICD-10-CM

## 2017-02-07 DIAGNOSIS — I2510 Atherosclerotic heart disease of native coronary artery without angina pectoris: Secondary | ICD-10-CM | POA: Diagnosis not present

## 2017-02-07 DIAGNOSIS — G4733 Obstructive sleep apnea (adult) (pediatric): Secondary | ICD-10-CM | POA: Diagnosis not present

## 2017-02-07 DIAGNOSIS — I4891 Unspecified atrial fibrillation: Secondary | ICD-10-CM

## 2017-02-07 DIAGNOSIS — I10 Essential (primary) hypertension: Secondary | ICD-10-CM | POA: Diagnosis not present

## 2017-02-07 HISTORY — DX: Atherosclerotic heart disease of native coronary artery without angina pectoris: I25.10

## 2017-02-07 MED ORDER — APIXABAN 5 MG PO TABS: 5.0000 mg | ORAL_TABLET | Freq: Two times a day (BID) | ORAL | 3 refills | Status: DC

## 2017-02-07 NOTE — Patient Instructions (Addendum)
Medication Instructions:  Your physician has recommended you make the following change in your medication:  START Eliquis 5 mg 2 times daily.   Labwork: None  Testing/Procedures: Your physician has requested that you have an echocardiogram. Echocardiography is a painless test that uses sound waves to create images of your heart. It provides your doctor with information about the size and shape of your heart and how well your heart's chambers and valves are working. This procedure takes approximately one hour. There are no restrictions for this procedure.  Your physician has recommended that you wear a holter monitor. Holter monitors are medical devices that record the heart's electrical activity. Doctors most often use these monitors to diagnose arrhythmias. Arrhythmias are problems with the speed or rhythm of the heartbeat. The monitor is a small, portable device. You can wear one while you do your normal daily activities. This is usually used to diagnose what is causing palpitations/syncope (passing out). 48 hours.    Follow-Up: Your physician recommends that you schedule a follow-up appointment in: 4 weeks    Any Other Special Instructions Will Be Listed Below (If Applicable).     If you need a refill on your cardiac medications before your next appointment, please call your pharmacy.

## 2017-02-10 ENCOUNTER — Ambulatory Visit: Payer: Medicare Other

## 2017-02-10 ENCOUNTER — Ambulatory Visit (HOSPITAL_BASED_OUTPATIENT_CLINIC_OR_DEPARTMENT_OTHER)
Admission: RE | Admit: 2017-02-10 | Discharge: 2017-02-10 | Disposition: A | Discharge status: Home / Self Care | Payer: Medicare HMO | Source: Ambulatory Visit | Attending: Cardiology | Admitting: Cardiology

## 2017-02-10 DIAGNOSIS — I10 Essential (primary) hypertension: Secondary | ICD-10-CM | POA: Insufficient documentation

## 2017-02-10 DIAGNOSIS — I2510 Atherosclerotic heart disease of native coronary artery without angina pectoris: Secondary | ICD-10-CM | POA: Insufficient documentation

## 2017-02-10 DIAGNOSIS — I4891 Unspecified atrial fibrillation: Secondary | ICD-10-CM

## 2017-02-10 DIAGNOSIS — E119 Type 2 diabetes mellitus without complications: Secondary | ICD-10-CM | POA: Insufficient documentation

## 2017-02-10 DIAGNOSIS — E669 Obesity, unspecified: Secondary | ICD-10-CM | POA: Diagnosis not present

## 2017-02-10 DIAGNOSIS — Z6841 Body Mass Index (BMI) 40.0 and over, adult: Secondary | ICD-10-CM | POA: Insufficient documentation

## 2017-02-10 MED ORDER — PERFLUTREN LIPID MICROSPHERE
1.0000 mL | INTRAVENOUS | Status: AC | PRN
  Administered 2017-02-10: 8 mL via INTRAVENOUS
  Filled 2017-02-10: qty 10

## 2017-02-10 NOTE — Progress Notes (Signed)
  Echocardiogram 2D Echocardiogram with contrast has been performed.  Joelene Millin 02/10/2017, 9:24 AM

## 2017-02-11 MED FILL — Perflutren Lipid Microsphere IV Susp 1.1 MG/ML: INTRAVENOUS | Qty: 10 | Status: AC

## 2017-02-14 ENCOUNTER — Telehealth: Payer: Self-pay | Admitting: Cardiology

## 2017-02-14 DIAGNOSIS — Z87891 Personal history of nicotine dependence: Secondary | ICD-10-CM | POA: Diagnosis not present

## 2017-02-14 NOTE — Telephone Encounter (Signed)
Returning your call. °

## 2017-02-14 NOTE — Telephone Encounter (Signed)
Left message to return call 

## 2017-02-17 MED ORDER — METOPROLOL TARTRATE 25 MG PO TABS: 25.0000 mg | ORAL_TABLET | Freq: Three times a day (TID) | ORAL | 0 refills | Status: DC

## 2017-02-17 NOTE — Telephone Encounter (Signed)
Patient informed of results. Patient advised to take metoprolol three times daily. Patient verbalized understanding, no further questions.

## 2017-02-17 NOTE — Addendum Note (Signed)
Addended by: Jossie Ng on: 02/17/2017 09:36 AM   Modules accepted: Orders

## 2017-02-24 DIAGNOSIS — F1120 Opioid dependence, uncomplicated: Secondary | ICD-10-CM | POA: Diagnosis not present

## 2017-02-24 DIAGNOSIS — M5416 Radiculopathy, lumbar region: Secondary | ICD-10-CM | POA: Diagnosis not present

## 2017-02-24 DIAGNOSIS — M961 Postlaminectomy syndrome, not elsewhere classified: Secondary | ICD-10-CM | POA: Diagnosis not present

## 2017-02-24 DIAGNOSIS — Z79891 Long term (current) use of opiate analgesic: Secondary | ICD-10-CM | POA: Diagnosis not present

## 2017-02-24 DIAGNOSIS — G894 Chronic pain syndrome: Secondary | ICD-10-CM | POA: Diagnosis not present

## 2017-02-24 DIAGNOSIS — M1612 Unilateral primary osteoarthritis, left hip: Secondary | ICD-10-CM | POA: Diagnosis not present

## 2017-02-24 DIAGNOSIS — G4733 Obstructive sleep apnea (adult) (pediatric): Secondary | ICD-10-CM | POA: Diagnosis not present

## 2017-03-06 NOTE — Progress Notes (Signed)
Cardiology Office Note:    Date:  03/07/2017   ID:  David Fitzgerald, DOB 28-Jan-1961, MRN [id number]  PCP:  Angelina Sheriff, MD  Cardiologist:  Shirlee More, MD    Referring MD: Angelina Sheriff, MD    ASSESSMENT:    1. Persistent atrial fibrillation ([place])   2. Hypertensive heart disease without heart failure   3. Chronic anticoagulation    PLAN:    In order of problems listed above:  1. Stable, rate controlled with a beta blocker and anticoagulated on apixaban we discussed the potential of attempts to restore sinus rhythm he has normal left ventricular function he is asymptomatic and after discussion of antiarrhythmic drugs cardioversion referral for EP procedures he elects to remain in rate controlled atrial fibrillation and anticoagulation.  1 second continue current medical treatment 2. Stable 3. Stable continue his apixaban   Next appointment: 1 year   Medication Adjustments/Labs and Tests Ordered: Current medicines are reviewed at length with the patient today.  Concerns regarding medicines are outlined above.  No orders of the defined types were placed in this encounter.  No orders of the defined types were placed in this encounter.   Chief Complaint  Patient presents with  . Follow-up    after echo and HM   . Atrial Fibrillation    History of Present Illness:    David Fitzgerald is a 57 y.o. male with a hx of atrial fibrillation CHADS2vasc of 2  found to be in rate controlled asymptomatic atrial fibrillation during endoscopy 02/03/17 confirmed on EKG. last seen 02/07/17.  02/07/17: ASSESSMENT:    1. Atrial fibrillation, unspecified type ([place])   2. OSA treated with BiPAP   3. Morbid obesity with BMI of 45.0-49.9, adult ([place])   4. Essential hypertension    PLAN:   In order of problems listed above: 1. His atrial fibrillation is age-indeterminate asymptomatic and is not controlled rate on his beta-blocker.  At this time we will not attempt to resume  sinus rhythm as he is asymptomatic.  He has a moderate stroke risk chads 2 score of 2 and will start anticoagulation with Eliquis.  I asked him for further evaluation to have a Holter monitor to assess his heart rate response and seems to be appropriate on beta-blocker and echocardiogram to assess structural heart disease I encouraged him to continue to lose weight which is helpful with atrial fibrillation and to resume CPAP therapy for his sleep apnea. 2. He will resume CPAP 3. Encouraged him to continue to lose weight 4. Continue current treatment including ARB and beta-blocker.  Compliance with diet, lifestyle and medications: Yes Past Medical History:  Diagnosis Date  . Allergy to pollen 04/06/2015  . Anxiety 02/06/2017  . Arthritis 02/06/2017  . Bee sting allergy    wasp / mixed vespid  . Bell's palsy 02/24/2013  . Chronic venous insufficiency 12/14/2014  . Diabetes ([place])   . Edema 12/14/2014  . Hyperlipidemia, mixed 12/14/2014  . Hypertension   . Lumbar pain with radiation down left leg 07/09/2012  . Morbid obesity ([place]) 12/14/2014  . Morbid obesity with BMI of 45.0-49.9, adult ([place]) 07/09/2012  . Obesity hypoventilation syndrome ([place]) 12/14/2014  . OSA treated with BiPAP 12/14/2014  . Pain syndrome, chronic 07/09/2012  . Pernicious anemia 02/06/2017  . Polyneuropathy in diabetes ([place]) 02/24/2013  . Syndrome affecting cervical region 02/24/2013  . Trigeminal neuralgia 02/24/2013  . Type 2 diabetes mellitus with diabetic neuropathy ([place]) 12/14/2014  Past Surgical History:  Procedure Laterality Date  . ADENOIDECTOMY    . BACK SURGERY  2002 /2003  . CARPAL TUNNEL RELEASE Bilateral   . KNEE SURGERY Left 2005  . SHOULDER SURGERY Left 1998  . TONSILLECTOMY      Current Medications: Current Meds  Medication Sig  . amLODipine (NORVASC) 5 MG tablet Take 5 mg by mouth daily.  Marland Kitchen apixaban (ELIQUIS) 5 MG TABS tablet Take 1 tablet (5 mg total) by mouth 2 (two) times daily.  . DULoxetine (CYMBALTA) 60  MG capsule daily.  Marland Kitchen EPINEPHrine 0.3 mg/0.3 mL IJ SOAJ injection Inject into the muscle once.  . furosemide (LASIX) 40 MG tablet Take 40 mg by mouth daily as needed.   . gabapentin (NEURONTIN) 300 MG capsule Take 600 mg by mouth 3 (three) times daily.   Marland Kitchen ibuprofen (ADVIL,MOTRIN) 200 MG tablet Take 200 mg by mouth every 8 (eight) hours as needed.  Marland Kitchen losartan (COZAAR) 100 MG tablet Take 100 mg by mouth daily.  . metFORMIN (GLUCOPHAGE) 500 MG tablet Take 500 mg by mouth daily with breakfast.  . metoprolol tartrate (LOPRESSOR) 25 MG tablet Take 1 tablet (25 mg total) by mouth 3 (three) times daily.  Marland Kitchen MIXED VESPID VENOM PROTEIN [place] Inject into the skin.  Marland Kitchen nitroGLYCERIN (NITROSTAT) 0.4 MG SL tablet Reported on 04/26/2015  . NON FORMULARY Hemp Oil  . Omega-3 Fatty Acids (FISH OIL) 1000 MG CAPS Take 1 capsule by mouth daily.  Marland Kitchen oxyCODONE-acetaminophen (PERCOCET) 7.5-325 MG tablet Take 1 tablet by mouth every 8 (eight) hours as needed for severe pain.  . simvastatin (ZOCOR) 20 MG tablet Take 20 mg by mouth daily.  . VENOMIL WASP VENOM IJ Inject as directed.     Allergies:   Aspirin; Bee venom; and Penicillins   Social History   Socioeconomic History  . Marital status: Married    Spouse name: None  . Number of children: None  . Years of education: None  . Highest education level: None  Social Needs  . Financial resource strain: None  . Food insecurity - worry: None  . Food insecurity - inability: None  . Transportation needs - medical: None  . Transportation needs - non-medical: None  Occupational History  . None  Tobacco Use  . Smoking status: Former Smoker    Packs/day: 3.00    Years: 35.00    Pack years: 105.00    Types: Cigarettes    Last attempt to quit: 04/13/2006    Years since quitting: 10.9  . Smokeless tobacco: Never Used  Substance and Sexual Activity  . Alcohol use: No    Alcohol/week: 0.0 oz  . Drug use: No  . Sexual activity: None  Other Topics Concern  . None    Social History Narrative  . None     Family History: The patient's family history includes Atrial fibrillation in his father; Breast cancer in his maternal grandmother and mother; CAD in his father; Heart disease in his father and paternal uncle; Stomach cancer in his paternal aunt. ROS:   Please see the history of present illness.    All other systems reviewed and are negative.  EKGs/Labs/Other Studies Reviewed:    The following studies were reviewed today:  Echo/TEE 02/10/17: Study Conclusions - Left ventricle: The cavity size was normal. Systolic function was   normal. The estimated ejection fraction was in the range of 55%   to 60%. Wall motion was normal; there were no regional wall   motion  abnormalities. - Left atrium: The atrium was mildly to moderately dilated. Impressions: - Technically difficult and limited study.   Normal LVEF.   Mild to moderate LAE.   No significant aortic stenosis identify.  Holter 02/13/17: Study Highlights  Date of test:                 02/12/2017 Duration of test:           48 hours Indication:                    Atrial fibrillation Ordering physician:       Dr. Bettina Gavia Interpreting physician:   Dr. Bettina Gavia Baseline rhythm: Atrial fibrillation Minimum heart rate 46 average heart rate 95 maximal heart rate 166 bpm Atrial arrhythmia: Atrial fibrillation 100% of the time Ventricular arrhythmia: Total of 191 PVCs were recorded with couplets and triplets but on examination its typical aberrant conduction of atrial fibrillation Conduction abnormality: None longest R to R interval 2.3 seconds QT interval: Normal average 421 ms Symptoms: None Conclusion: Persistent atrial fibrillation rapid rate   Recent Labs: No results found for requested labs within last 8760 hours.  Recent Lipid Panel No results found for: CHOL, TRIG, HDL, CHOLHDL, VLDL, LDLCALC, LDLDIRECT  Physical Exam:    VS:  BP 128/82 (BP Location: Right Arm, Patient Position:  Sitting, Cuff Size: Large)   Pulse 80   Ht 6\' 2"  (1.88 m)   Wt (!) 378 lb 1.9 oz (171.5 kg)   SpO2 97%   BMI 48.55 kg/m     Wt Readings from Last 3 Encounters:  03/07/17 (!) 378 lb 1.9 oz (171.5 kg)  02/07/17 (!) 379 lb 1.3 oz (171.9 kg)  09/04/15 (!) 376 lb (170.6 kg)     GEN: Quite obese  in no acute distress HEENT: Normal NECK: No JVD; No carotid bruits LYMPHATICS: No lymphadenopathy CARDIAC: Irr Irr variable S1 RESPIRATORY:  Clear to auscultation without rales, wheezing or rhonchi  ABDOMEN: Soft, non-tender, non-distended MUSCULOSKELETAL:  No edema; No deformity  SKIN: Warm and dry NEUROLOGIC:  Alert and oriented x 3 PSYCHIATRIC:  Normal affect    Signed, Shirlee More, MD  03/07/2017 10:27 AM    [facility]

## 2017-03-07 ENCOUNTER — Ambulatory Visit: Payer: Medicare HMO | Admitting: Cardiology

## 2017-03-07 ENCOUNTER — Other Ambulatory Visit: Payer: Self-pay

## 2017-03-07 ENCOUNTER — Encounter: Payer: Self-pay | Admitting: Cardiology

## 2017-03-07 VITALS — BP 128/82 | HR 80 | Ht 74.0 in | Wt 378.1 lb

## 2017-03-07 DIAGNOSIS — I119 Hypertensive heart disease without heart failure: Secondary | ICD-10-CM | POA: Diagnosis not present

## 2017-03-07 DIAGNOSIS — Z7901 Long term (current) use of anticoagulants: Secondary | ICD-10-CM | POA: Insufficient documentation

## 2017-03-07 DIAGNOSIS — I4819 Other persistent atrial fibrillation: Secondary | ICD-10-CM

## 2017-03-07 DIAGNOSIS — I481 Persistent atrial fibrillation: Secondary | ICD-10-CM

## 2017-03-07 HISTORY — DX: Long term (current) use of anticoagulants: Z79.01

## 2017-03-07 MED ORDER — METOPROLOL TARTRATE 25 MG PO TABS: 25.0000 mg | ORAL_TABLET | Freq: Three times a day (TID) | ORAL | 0 refills | Status: DC

## 2017-03-07 MED ORDER — APIXABAN 5 MG PO TABS: 5.0000 mg | ORAL_TABLET | Freq: Two times a day (BID) | ORAL | 3 refills | Status: DC

## 2017-03-07 NOTE — Patient Instructions (Addendum)
Medication Instructions:  Your physician recommends that you continue on your current medications as directed. Please refer to the Current Medication list given to you today.   Labwork: None  Testing/Procedures: None  Follow-Up: Your physician wants you to follow-up in: 1 year with Dr Bettina Gavia.   You will receive a reminder letter in the mail two months in advance. If you don't receive a letter, please call our office to schedule the follow-up appointment.   Any Other Special Instructions Will Be Listed Below (If Applicable).     If you need a refill on your cardiac medications before your next appointment, please call your pharmacy.        Atrial Fibrillation Atrial fibrillation is a type of heartbeat that is irregular or fast (rapid). If you have this condition, your heart keeps quivering in a weird (chaotic) way. This condition can make it so your heart cannot pump blood normally. Having this condition gives a person more risk for stroke, heart failure, and other heart problems. There are different types of atrial fibrillation. Talk with your doctor to learn about the type that you have. Follow these instructions at home:  Take over-the-counter and prescription medicines only as told by your doctor.  If your doctor prescribed a blood-thinning medicine, take it exactly as told. Taking too much of it can cause bleeding. If you do not take enough of it, you will not have the protection that you need against stroke and other problems.  Do not use any tobacco products. These include cigarettes, chewing tobacco, and e-cigarettes. If you need help quitting, ask your doctor.  If you have apnea (obstructive sleep apnea), manage it as told by your doctor.  Do not drink alcohol.  Do not drink beverages that have caffeine. These include coffee, soda, and tea.  Maintain a healthy weight. Do not use diet pills unless your doctor says they are safe for you. Diet pills may make heart  problems worse.  Follow diet instructions as told by your doctor.  Exercise regularly as told by your doctor.  Keep all follow-up visits as told by your doctor. This is important. Contact a doctor if:  You notice a change in the speed, rhythm, or strength of your heartbeat.  You are taking a blood-thinning medicine and you notice more bruising.  You get tired more easily when you move or exercise. Get help right away if:  You have pain in your chest or your belly (abdomen).  You have sweating or weakness.  You feel sick to your stomach (nauseous).  You notice blood in your throw up (vomit), poop (stool), or pee (urine).  You are short of breath.  You suddenly have swollen feet and ankles.  You feel dizzy.  Your suddenly get weak or numb in your face, arms, or legs, especially if it happens on one side of your body.  You have trouble talking, trouble understanding, or both.  Your face or your eyelid droops on one side. These symptoms may be an emergency. Do not wait to see if the symptoms will go away. Get medical help right away. Call your local emergency services (911 in the [place] Do not drive yourself to the hospital. This information is not intended to replace advice given to you by your health care provider. Make sure you discuss any questions you have with your health care provider. Document Released: 10/31/2007 Document Revised: 06/29/2015 Document Reviewed: 05/18/2014 Elsevier Interactive Patient Education  [employer].

## 2017-03-13 ENCOUNTER — Ambulatory Visit (INDEPENDENT_AMBULATORY_CARE_PROVIDER_SITE_OTHER): Payer: Medicare HMO

## 2017-03-13 DIAGNOSIS — Z13220 Encounter for screening for lipoid disorders: Secondary | ICD-10-CM | POA: Diagnosis not present

## 2017-03-13 DIAGNOSIS — E1165 Type 2 diabetes mellitus with hyperglycemia: Secondary | ICD-10-CM | POA: Diagnosis not present

## 2017-03-13 DIAGNOSIS — T63441D Toxic effect of venom of bees, accidental (unintentional), subsequent encounter: Secondary | ICD-10-CM | POA: Diagnosis not present

## 2017-03-13 DIAGNOSIS — Z0000 Encounter for general adult medical examination without abnormal findings: Secondary | ICD-10-CM | POA: Diagnosis not present

## 2017-03-13 DIAGNOSIS — Z125 Encounter for screening for malignant neoplasm of prostate: Secondary | ICD-10-CM | POA: Diagnosis not present

## 2017-03-17 DIAGNOSIS — Z0000 Encounter for general adult medical examination without abnormal findings: Secondary | ICD-10-CM | POA: Diagnosis not present

## 2017-03-17 DIAGNOSIS — Z6841 Body Mass Index (BMI) 40.0 and over, adult: Secondary | ICD-10-CM | POA: Diagnosis not present

## 2017-03-17 DIAGNOSIS — E785 Hyperlipidemia, unspecified: Secondary | ICD-10-CM | POA: Diagnosis not present

## 2017-03-17 DIAGNOSIS — D510 Vitamin B12 deficiency anemia due to intrinsic factor deficiency: Secondary | ICD-10-CM | POA: Diagnosis not present

## 2017-03-19 DIAGNOSIS — D510 Vitamin B12 deficiency anemia due to intrinsic factor deficiency: Secondary | ICD-10-CM | POA: Diagnosis not present

## 2017-03-27 DIAGNOSIS — D510 Vitamin B12 deficiency anemia due to intrinsic factor deficiency: Secondary | ICD-10-CM | POA: Diagnosis not present

## 2017-03-31 DIAGNOSIS — Z79891 Long term (current) use of opiate analgesic: Secondary | ICD-10-CM | POA: Diagnosis not present

## 2017-03-31 DIAGNOSIS — M1612 Unilateral primary osteoarthritis, left hip: Secondary | ICD-10-CM | POA: Diagnosis not present

## 2017-03-31 DIAGNOSIS — M5416 Radiculopathy, lumbar region: Secondary | ICD-10-CM | POA: Diagnosis not present

## 2017-03-31 DIAGNOSIS — G4733 Obstructive sleep apnea (adult) (pediatric): Secondary | ICD-10-CM | POA: Diagnosis not present

## 2017-03-31 DIAGNOSIS — M961 Postlaminectomy syndrome, not elsewhere classified: Secondary | ICD-10-CM | POA: Diagnosis not present

## 2017-03-31 DIAGNOSIS — G894 Chronic pain syndrome: Secondary | ICD-10-CM | POA: Diagnosis not present

## 2017-03-31 DIAGNOSIS — F1120 Opioid dependence, uncomplicated: Secondary | ICD-10-CM | POA: Diagnosis not present

## 2017-04-11 DIAGNOSIS — D510 Vitamin B12 deficiency anemia due to intrinsic factor deficiency: Secondary | ICD-10-CM | POA: Diagnosis not present

## 2017-04-28 ENCOUNTER — Encounter: Payer: Self-pay | Admitting: Allergy and Immunology

## 2017-04-28 ENCOUNTER — Ambulatory Visit: Payer: Medicare HMO | Admitting: Allergy and Immunology

## 2017-04-28 VITALS — BP 130/88 | HR 64 | Resp 18

## 2017-04-28 DIAGNOSIS — M5416 Radiculopathy, lumbar region: Secondary | ICD-10-CM | POA: Diagnosis not present

## 2017-04-28 DIAGNOSIS — M961 Postlaminectomy syndrome, not elsewhere classified: Secondary | ICD-10-CM | POA: Diagnosis not present

## 2017-04-28 DIAGNOSIS — F1120 Opioid dependence, uncomplicated: Secondary | ICD-10-CM | POA: Diagnosis not present

## 2017-04-28 DIAGNOSIS — Z79891 Long term (current) use of opiate analgesic: Secondary | ICD-10-CM | POA: Diagnosis not present

## 2017-04-28 DIAGNOSIS — G894 Chronic pain syndrome: Secondary | ICD-10-CM | POA: Diagnosis not present

## 2017-04-28 DIAGNOSIS — T63441D Toxic effect of venom of bees, accidental (unintentional), subsequent encounter: Secondary | ICD-10-CM | POA: Diagnosis not present

## 2017-04-28 DIAGNOSIS — D510 Vitamin B12 deficiency anemia due to intrinsic factor deficiency: Secondary | ICD-10-CM | POA: Diagnosis not present

## 2017-04-28 DIAGNOSIS — M1612 Unilateral primary osteoarthritis, left hip: Secondary | ICD-10-CM | POA: Diagnosis not present

## 2017-04-28 DIAGNOSIS — IMO0001 Reserved for inherently not codable concepts without codable children: Secondary | ICD-10-CM

## 2017-04-28 DIAGNOSIS — G4733 Obstructive sleep apnea (adult) (pediatric): Secondary | ICD-10-CM | POA: Diagnosis not present

## 2017-04-28 NOTE — Progress Notes (Signed)
Follow-up Note  Referring Provider: Angelina Sheriff, MD Primary Provider: Angelina Sheriff, MD Date of Office Visit: 04/28/2017  Subjective:   David Fitzgerald (DOB: 10/25/1960) is a 57 y.o. 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  His last visit to this clinic was 25 April 2016.  He continues on immunotherapy at every 6-8 weeks and has had no adverse effect as a result of this treatment.  He has been stung multiple times this year by flying hymenoptera without any problem.  David Fitzgerald lost his wife to a health issue this past August.  Allergies as of 04/28/2017      Reactions   Aspirin    Relative contraindication due to hematochezia while on it   Bee Venom    Penicillins       Medication List      amLODipine 5 MG tablet Commonly known as:  NORVASC Take 5 mg by mouth daily.   apixaban 5 MG Tabs tablet Commonly known as:  ELIQUIS Take 1 tablet (5 mg total) by mouth 2 (two) times daily.   DULoxetine 60 MG capsule Commonly known as:  CYMBALTA daily.   EPINEPHrine 0.3 mg/0.3 mL Soaj injection Commonly known as:  EPI-PEN Inject into the muscle once.   Fish Oil 1000 MG Caps Take 1 capsule by mouth daily.   furosemide 40 MG tablet Commonly known as:  LASIX Take 40 mg by mouth daily as needed.   gabapentin 300 MG capsule Commonly known as:  NEURONTIN Take 600 mg by mouth 3 (three) times daily.   ibuprofen 200 MG tablet Commonly known as:  ADVIL,MOTRIN Take 200 mg by mouth every 8 (eight) hours as needed.   losartan 100 MG tablet Commonly known as:  COZAAR Take 100 mg by mouth daily.   metFORMIN 500 MG tablet Commonly known as:  GLUCOPHAGE Take 500 mg by mouth daily with breakfast.   metoprolol tartrate 25 MG tablet Commonly known as:  LOPRESSOR Take 1 tablet (25 mg total)  by mouth 3 (three) times daily.   MIXED VESPID VENOM PROTEIN [place] Inject into the skin.   NITROSTAT 0.4 MG SL tablet Generic drug:  nitroGLYCERIN Reported on 04/26/2015   NON FORMULARY [place] Oil   oxyCODONE-acetaminophen 7.5-325 MG tablet Commonly known as:  PERCOCET Take 1 tablet by mouth every 8 (eight) hours as needed for severe pain.   simvastatin 20 MG tablet Commonly known as:  ZOCOR Take 20 mg by mouth daily.   VENOMIL WASP VENOM IJ Inject as directed.       Past Medical History:  Diagnosis Date  . Allergy to pollen 04/06/2015  . Anxiety 02/06/2017  . Arthritis 02/06/2017  . Bee sting allergy    wasp / mixed vespid  . Bell's palsy 02/24/2013  . Chronic venous insufficiency 12/14/2014  . Diabetes ([place])   . Edema 12/14/2014  . Hyperlipidemia, mixed 12/14/2014  . Hypertension   . Lumbar pain with radiation down left leg 07/09/2012  . Morbid obesity ([place]) 12/14/2014  . Morbid obesity with BMI of 45.0-49.9, adult ([place]) 07/09/2012  . Obesity hypoventilation syndrome ([place]) 12/14/2014  . OSA treated with BiPAP 12/14/2014  . Pain syndrome, chronic 07/09/2012  . Pernicious anemia 02/06/2017  . Polyneuropathy in diabetes ([place]) 02/24/2013  . Syndrome affecting cervical region 02/24/2013  . Trigeminal neuralgia 02/24/2013  .  Type 2 diabetes mellitus with diabetic neuropathy ([place]) 12/14/2014    Past Surgical History:  Procedure Laterality Date  . ADENOIDECTOMY    . BACK SURGERY  2002 /2003  . CARPAL TUNNEL RELEASE Bilateral   . KNEE SURGERY Left 2005  . SHOULDER SURGERY Left 1998  . TONSILLECTOMY      Review of systems negative except as noted in HPI / PMHx or noted below:  Review of Systems  Constitutional: Negative.   HENT: Negative.   Eyes: Negative.   Respiratory: Negative.   Cardiovascular: Negative.   Gastrointestinal: Negative.   Genitourinary: Negative.   Musculoskeletal: Negative.   Skin: Negative.   Neurological: Negative.   Endo/Heme/Allergies: Negative.     Psychiatric/Behavioral: Negative.      Objective:   Vitals:   04/28/17 1530  BP: 130/88  Pulse: 64  Resp: 18          Physical Exam  Constitutional: He is well-developed, well-nourished, and in no distress.  HENT:  Head: Normocephalic.  Right Ear: Tympanic membrane, external ear and ear canal normal.  Left Ear: Tympanic membrane, external ear and ear canal normal.  Nose: Nose normal. No mucosal edema or rhinorrhea.  Mouth/Throat: Uvula is midline, oropharynx is clear and moist and mucous membranes are normal. No oropharyngeal exudate.  Eyes: Conjunctivae are normal.  Neck: Trachea normal. No tracheal tenderness present. No tracheal deviation present. No thyromegaly present.  Cardiovascular: Normal rate, regular rhythm, S1 normal, S2 normal and normal heart sounds.  No murmur heard. Pulmonary/Chest: Breath sounds normal. No stridor. No respiratory distress. He has no wheezes. He has no rales.  Musculoskeletal: He exhibits no edema.  Lymphadenopathy:       Head (right side): No tonsillar adenopathy present.       Head (left side): No tonsillar adenopathy present.    He has no cervical adenopathy.  Neurological: He is alert. Gait normal.  Skin: No rash noted. He is not diaphoretic. No erythema. Nails show no clubbing.  Psychiatric: Mood and affect normal.    Diagnostics: none  Assessment and Plan:   1. Hymenoptera reaction, accidental or unintentional, subsequent encounter     1. Continue immunotherapy and EpiPen  2. Return to clinic in 1 year or earlier if problem  David Fitzgerald appears to be doing quite well on his immunotherapy and he will continue on this form of treatment and I will see him back in this clinic in approximately 1 year or earlier if there is a problem.  [name], MD Allergy / Immunology [place]

## 2017-04-28 NOTE — Patient Instructions (Addendum)
  1.  Continue immunotherapy (and EpiPen) ° °2.  Return to clinic in 1 year or earlier if problem °

## 2017-04-29 ENCOUNTER — Encounter: Payer: Self-pay | Admitting: Allergy and Immunology

## 2017-05-08 ENCOUNTER — Ambulatory Visit (INDEPENDENT_AMBULATORY_CARE_PROVIDER_SITE_OTHER): Payer: Medicare HMO | Admitting: *Deleted

## 2017-05-08 DIAGNOSIS — IMO0001 Reserved for inherently not codable concepts without codable children: Secondary | ICD-10-CM

## 2017-05-08 DIAGNOSIS — Z6841 Body Mass Index (BMI) 40.0 and over, adult: Secondary | ICD-10-CM | POA: Diagnosis not present

## 2017-05-08 DIAGNOSIS — J329 Chronic sinusitis, unspecified: Secondary | ICD-10-CM | POA: Diagnosis not present

## 2017-05-08 DIAGNOSIS — T63441D Toxic effect of venom of bees, accidental (unintentional), subsequent encounter: Secondary | ICD-10-CM | POA: Diagnosis not present

## 2017-05-15 ENCOUNTER — Other Ambulatory Visit: Payer: Self-pay | Admitting: Cardiology

## 2017-05-26 DIAGNOSIS — G4733 Obstructive sleep apnea (adult) (pediatric): Secondary | ICD-10-CM | POA: Diagnosis not present

## 2017-05-26 DIAGNOSIS — M1612 Unilateral primary osteoarthritis, left hip: Secondary | ICD-10-CM | POA: Diagnosis not present

## 2017-05-26 DIAGNOSIS — G894 Chronic pain syndrome: Secondary | ICD-10-CM | POA: Diagnosis not present

## 2017-05-26 DIAGNOSIS — M961 Postlaminectomy syndrome, not elsewhere classified: Secondary | ICD-10-CM | POA: Diagnosis not present

## 2017-05-26 DIAGNOSIS — Z79891 Long term (current) use of opiate analgesic: Secondary | ICD-10-CM | POA: Diagnosis not present

## 2017-05-26 DIAGNOSIS — F1120 Opioid dependence, uncomplicated: Secondary | ICD-10-CM | POA: Diagnosis not present

## 2017-05-26 DIAGNOSIS — M5416 Radiculopathy, lumbar region: Secondary | ICD-10-CM | POA: Diagnosis not present

## 2017-06-19 ENCOUNTER — Ambulatory Visit (INDEPENDENT_AMBULATORY_CARE_PROVIDER_SITE_OTHER): Payer: Medicare HMO

## 2017-06-19 DIAGNOSIS — T63441D Toxic effect of venom of bees, accidental (unintentional), subsequent encounter: Secondary | ICD-10-CM

## 2017-06-20 DIAGNOSIS — G894 Chronic pain syndrome: Secondary | ICD-10-CM | POA: Diagnosis not present

## 2017-06-20 DIAGNOSIS — F1120 Opioid dependence, uncomplicated: Secondary | ICD-10-CM | POA: Diagnosis not present

## 2017-06-20 DIAGNOSIS — Z79891 Long term (current) use of opiate analgesic: Secondary | ICD-10-CM | POA: Diagnosis not present

## 2017-06-20 DIAGNOSIS — M961 Postlaminectomy syndrome, not elsewhere classified: Secondary | ICD-10-CM | POA: Diagnosis not present

## 2017-06-20 DIAGNOSIS — M1612 Unilateral primary osteoarthritis, left hip: Secondary | ICD-10-CM | POA: Diagnosis not present

## 2017-06-20 DIAGNOSIS — M5416 Radiculopathy, lumbar region: Secondary | ICD-10-CM | POA: Diagnosis not present

## 2017-06-20 DIAGNOSIS — G4733 Obstructive sleep apnea (adult) (pediatric): Secondary | ICD-10-CM | POA: Diagnosis not present

## 2017-07-22 DIAGNOSIS — G894 Chronic pain syndrome: Secondary | ICD-10-CM | POA: Diagnosis not present

## 2017-07-22 DIAGNOSIS — F1120 Opioid dependence, uncomplicated: Secondary | ICD-10-CM | POA: Diagnosis not present

## 2017-07-22 DIAGNOSIS — Z79891 Long term (current) use of opiate analgesic: Secondary | ICD-10-CM | POA: Diagnosis not present

## 2017-07-22 DIAGNOSIS — M961 Postlaminectomy syndrome, not elsewhere classified: Secondary | ICD-10-CM | POA: Diagnosis not present

## 2017-07-22 DIAGNOSIS — M1612 Unilateral primary osteoarthritis, left hip: Secondary | ICD-10-CM | POA: Diagnosis not present

## 2017-07-22 DIAGNOSIS — G4733 Obstructive sleep apnea (adult) (pediatric): Secondary | ICD-10-CM | POA: Diagnosis not present

## 2017-07-22 DIAGNOSIS — M5416 Radiculopathy, lumbar region: Secondary | ICD-10-CM | POA: Diagnosis not present

## 2017-07-30 ENCOUNTER — Telehealth: Payer: Self-pay | Admitting: Pulmonary Disease

## 2017-07-30 NOTE — Telephone Encounter (Signed)
Left message for Patient to call back.  Received message from [facility] (through [facility]) that Patient has not been seen since  2017 and needs to have OV and be recert for O2.

## 2017-07-31 ENCOUNTER — Ambulatory Visit (INDEPENDENT_AMBULATORY_CARE_PROVIDER_SITE_OTHER): Payer: Medicare HMO | Admitting: *Deleted

## 2017-07-31 DIAGNOSIS — IMO0001 Reserved for inherently not codable concepts without codable children: Secondary | ICD-10-CM

## 2017-07-31 DIAGNOSIS — T63441D Toxic effect of venom of bees, accidental (unintentional), subsequent encounter: Secondary | ICD-10-CM | POA: Diagnosis not present

## 2017-08-12 DIAGNOSIS — Z9989 Dependence on other enabling machines and devices: Secondary | ICD-10-CM | POA: Diagnosis not present

## 2017-08-12 DIAGNOSIS — Z0000 Encounter for general adult medical examination without abnormal findings: Secondary | ICD-10-CM | POA: Diagnosis not present

## 2017-08-12 DIAGNOSIS — G4733 Obstructive sleep apnea (adult) (pediatric): Secondary | ICD-10-CM | POA: Diagnosis not present

## 2017-08-12 DIAGNOSIS — Z1339 Encounter for screening examination for other mental health and behavioral disorders: Secondary | ICD-10-CM | POA: Diagnosis not present

## 2017-08-12 DIAGNOSIS — E1165 Type 2 diabetes mellitus with hyperglycemia: Secondary | ICD-10-CM | POA: Diagnosis not present

## 2017-08-12 DIAGNOSIS — R1084 Generalized abdominal pain: Secondary | ICD-10-CM | POA: Diagnosis not present

## 2017-08-12 DIAGNOSIS — D510 Vitamin B12 deficiency anemia due to intrinsic factor deficiency: Secondary | ICD-10-CM | POA: Diagnosis not present

## 2017-08-26 ENCOUNTER — Telehealth: Payer: Self-pay | Admitting: *Deleted

## 2017-08-26 DIAGNOSIS — G894 Chronic pain syndrome: Secondary | ICD-10-CM | POA: Diagnosis not present

## 2017-08-26 DIAGNOSIS — F1120 Opioid dependence, uncomplicated: Secondary | ICD-10-CM | POA: Diagnosis not present

## 2017-08-26 DIAGNOSIS — M961 Postlaminectomy syndrome, not elsewhere classified: Secondary | ICD-10-CM | POA: Diagnosis not present

## 2017-08-26 DIAGNOSIS — M5416 Radiculopathy, lumbar region: Secondary | ICD-10-CM | POA: Diagnosis not present

## 2017-08-26 DIAGNOSIS — Z79891 Long term (current) use of opiate analgesic: Secondary | ICD-10-CM | POA: Diagnosis not present

## 2017-08-26 DIAGNOSIS — G4733 Obstructive sleep apnea (adult) (pediatric): Secondary | ICD-10-CM | POA: Diagnosis not present

## 2017-08-26 DIAGNOSIS — M1612 Unilateral primary osteoarthritis, left hip: Secondary | ICD-10-CM | POA: Diagnosis not present

## 2017-08-26 NOTE — Telephone Encounter (Signed)
Medication Eliquis is too expensive. Is there anything else he can take that is cheaper? [employer] Mail order.

## 2017-08-27 DIAGNOSIS — R1013 Epigastric pain: Secondary | ICD-10-CM | POA: Diagnosis not present

## 2017-08-27 DIAGNOSIS — R1084 Generalized abdominal pain: Secondary | ICD-10-CM | POA: Diagnosis not present

## 2017-08-28 NOTE — Telephone Encounter (Signed)
Left message for patient to return call.

## 2017-09-01 NOTE — Telephone Encounter (Signed)
Pt phoned back about Eliquis. Please advise

## 2017-09-01 NOTE — Telephone Encounter (Signed)
Spoke with patient regarding medication cost. Per Dr. Bettina Gavia patient advised to consult with insurance about any options to assist in paying for medication. Patient advised to call office back to change medication if not successful with insurance company. Patient verbally understands.

## 2017-09-02 ENCOUNTER — Telehealth: Payer: Self-pay | Admitting: Cardiology

## 2017-09-02 NOTE — Telephone Encounter (Signed)
Provided patient will eliquis support number as this would be the next step.

## 2017-09-02 NOTE — Telephone Encounter (Signed)
Returning you about ins and they do not offer any help at all with the Eliquis line that is cheaper than the Eliquis.. Please call him due to price of the meds.

## 2017-09-09 DIAGNOSIS — R197 Diarrhea, unspecified: Secondary | ICD-10-CM | POA: Diagnosis not present

## 2017-09-09 DIAGNOSIS — R1084 Generalized abdominal pain: Secondary | ICD-10-CM | POA: Diagnosis not present

## 2017-09-09 DIAGNOSIS — R110 Nausea: Secondary | ICD-10-CM | POA: Diagnosis not present

## 2017-09-11 ENCOUNTER — Ambulatory Visit (INDEPENDENT_AMBULATORY_CARE_PROVIDER_SITE_OTHER): Payer: Medicare HMO | Admitting: *Deleted

## 2017-09-11 DIAGNOSIS — T63441D Toxic effect of venom of bees, accidental (unintentional), subsequent encounter: Secondary | ICD-10-CM | POA: Diagnosis not present

## 2017-09-11 DIAGNOSIS — IMO0001 Reserved for inherently not codable concepts without codable children: Secondary | ICD-10-CM

## 2017-09-17 ENCOUNTER — Other Ambulatory Visit: Payer: Self-pay | Admitting: Cardiology

## 2017-09-17 MED ORDER — APIXABAN 5 MG PO TABS: 5.0000 mg | ORAL_TABLET | Freq: Two times a day (BID) | ORAL | 0 refills | Status: DC

## 2017-09-17 NOTE — Telephone Encounter (Signed)
30 day refill has been sent in

## 2017-09-17 NOTE — Telephone Encounter (Signed)
°*  STAT* If patient is at the pharmacy, call can be transferred to refill team.   1. Which medications need to be refilled? (please list name of each medication and dose if known)Eliquis 5 mg twice daily  2. Which pharmacy/location (including street and city if local pharmacy) is medication to be sent to?[facility]  3. Do they need a 30 day or 90 day supply? 5  Waiting for mail order and has ran out please call in 1 mo to local Pharmacy above

## 2017-09-23 DIAGNOSIS — M1612 Unilateral primary osteoarthritis, left hip: Secondary | ICD-10-CM | POA: Diagnosis not present

## 2017-09-23 DIAGNOSIS — M5416 Radiculopathy, lumbar region: Secondary | ICD-10-CM | POA: Diagnosis not present

## 2017-09-23 DIAGNOSIS — Z79891 Long term (current) use of opiate analgesic: Secondary | ICD-10-CM | POA: Diagnosis not present

## 2017-09-23 DIAGNOSIS — G894 Chronic pain syndrome: Secondary | ICD-10-CM | POA: Diagnosis not present

## 2017-09-23 DIAGNOSIS — G4733 Obstructive sleep apnea (adult) (pediatric): Secondary | ICD-10-CM | POA: Diagnosis not present

## 2017-09-23 DIAGNOSIS — F1120 Opioid dependence, uncomplicated: Secondary | ICD-10-CM | POA: Diagnosis not present

## 2017-09-23 DIAGNOSIS — M961 Postlaminectomy syndrome, not elsewhere classified: Secondary | ICD-10-CM | POA: Diagnosis not present

## 2017-10-09 DIAGNOSIS — R197 Diarrhea, unspecified: Secondary | ICD-10-CM | POA: Diagnosis not present

## 2017-10-14 DIAGNOSIS — R197 Diarrhea, unspecified: Secondary | ICD-10-CM | POA: Diagnosis not present

## 2017-10-14 DIAGNOSIS — R1084 Generalized abdominal pain: Secondary | ICD-10-CM | POA: Diagnosis not present

## 2017-10-14 DIAGNOSIS — R195 Other fecal abnormalities: Secondary | ICD-10-CM | POA: Diagnosis not present

## 2017-10-14 DIAGNOSIS — Z1212 Encounter for screening for malignant neoplasm of rectum: Secondary | ICD-10-CM | POA: Diagnosis not present

## 2017-10-15 ENCOUNTER — Telehealth: Payer: Self-pay

## 2017-10-15 MED ORDER — APIXABAN 5 MG PO TABS: 5.0000 mg | ORAL_TABLET | Freq: Two times a day (BID) | ORAL | 0 refills | Status: DC

## 2017-10-15 NOTE — Telephone Encounter (Signed)
Rx sent to pharmacy as requested.

## 2017-10-28 DIAGNOSIS — M1612 Unilateral primary osteoarthritis, left hip: Secondary | ICD-10-CM | POA: Diagnosis not present

## 2017-10-28 DIAGNOSIS — Z79891 Long term (current) use of opiate analgesic: Secondary | ICD-10-CM | POA: Diagnosis not present

## 2017-10-28 DIAGNOSIS — G4733 Obstructive sleep apnea (adult) (pediatric): Secondary | ICD-10-CM | POA: Diagnosis not present

## 2017-10-28 DIAGNOSIS — M5416 Radiculopathy, lumbar region: Secondary | ICD-10-CM | POA: Diagnosis not present

## 2017-10-28 DIAGNOSIS — F1120 Opioid dependence, uncomplicated: Secondary | ICD-10-CM | POA: Diagnosis not present

## 2017-10-28 DIAGNOSIS — G894 Chronic pain syndrome: Secondary | ICD-10-CM | POA: Diagnosis not present

## 2017-10-28 DIAGNOSIS — M961 Postlaminectomy syndrome, not elsewhere classified: Secondary | ICD-10-CM | POA: Diagnosis not present

## 2017-11-06 ENCOUNTER — Ambulatory Visit: Payer: Self-pay

## 2017-11-13 ENCOUNTER — Ambulatory Visit (INDEPENDENT_AMBULATORY_CARE_PROVIDER_SITE_OTHER): Payer: Medicare HMO | Admitting: *Deleted

## 2017-11-13 DIAGNOSIS — T63441D Toxic effect of venom of bees, accidental (unintentional), subsequent encounter: Secondary | ICD-10-CM | POA: Diagnosis not present

## 2017-11-13 DIAGNOSIS — IMO0001 Reserved for inherently not codable concepts without codable children: Secondary | ICD-10-CM

## 2017-11-17 ENCOUNTER — Other Ambulatory Visit: Payer: Self-pay

## 2017-11-17 MED ORDER — APIXABAN 5 MG PO TABS: 5.0000 mg | ORAL_TABLET | Freq: Two times a day (BID) | ORAL | 1 refills | Status: DC

## 2017-11-27 DIAGNOSIS — M961 Postlaminectomy syndrome, not elsewhere classified: Secondary | ICD-10-CM | POA: Diagnosis not present

## 2017-11-27 DIAGNOSIS — G894 Chronic pain syndrome: Secondary | ICD-10-CM | POA: Diagnosis not present

## 2017-11-27 DIAGNOSIS — Z79891 Long term (current) use of opiate analgesic: Secondary | ICD-10-CM | POA: Diagnosis not present

## 2017-11-27 DIAGNOSIS — F1120 Opioid dependence, uncomplicated: Secondary | ICD-10-CM | POA: Diagnosis not present

## 2017-11-27 DIAGNOSIS — G4733 Obstructive sleep apnea (adult) (pediatric): Secondary | ICD-10-CM | POA: Diagnosis not present

## 2017-11-27 DIAGNOSIS — M1612 Unilateral primary osteoarthritis, left hip: Secondary | ICD-10-CM | POA: Diagnosis not present

## 2017-11-27 DIAGNOSIS — M5416 Radiculopathy, lumbar region: Secondary | ICD-10-CM | POA: Diagnosis not present

## 2017-12-09 ENCOUNTER — Encounter: Payer: Self-pay | Admitting: Cardiology

## 2017-12-09 DIAGNOSIS — I4892 Unspecified atrial flutter: Secondary | ICD-10-CM | POA: Diagnosis not present

## 2017-12-09 DIAGNOSIS — R42 Dizziness and giddiness: Secondary | ICD-10-CM | POA: Diagnosis not present

## 2017-12-09 DIAGNOSIS — I959 Hypotension, unspecified: Secondary | ICD-10-CM | POA: Diagnosis not present

## 2017-12-09 DIAGNOSIS — E872 Acidosis: Secondary | ICD-10-CM | POA: Diagnosis not present

## 2017-12-09 DIAGNOSIS — Z6841 Body Mass Index (BMI) 40.0 and over, adult: Secondary | ICD-10-CM | POA: Diagnosis not present

## 2017-12-09 DIAGNOSIS — E785 Hyperlipidemia, unspecified: Secondary | ICD-10-CM | POA: Diagnosis not present

## 2017-12-09 DIAGNOSIS — Z743 Need for continuous supervision: Secondary | ICD-10-CM | POA: Diagnosis not present

## 2017-12-09 DIAGNOSIS — R7301 Impaired fasting glucose: Secondary | ICD-10-CM | POA: Diagnosis not present

## 2017-12-09 DIAGNOSIS — E875 Hyperkalemia: Secondary | ICD-10-CM | POA: Diagnosis not present

## 2017-12-09 DIAGNOSIS — I10 Essential (primary) hypertension: Secondary | ICD-10-CM | POA: Diagnosis not present

## 2017-12-09 DIAGNOSIS — E6609 Other obesity due to excess calories: Secondary | ICD-10-CM | POA: Diagnosis not present

## 2017-12-09 DIAGNOSIS — R55 Syncope and collapse: Secondary | ICD-10-CM | POA: Diagnosis not present

## 2017-12-09 DIAGNOSIS — I4891 Unspecified atrial fibrillation: Secondary | ICD-10-CM | POA: Diagnosis not present

## 2017-12-10 ENCOUNTER — Encounter: Payer: Self-pay | Admitting: Cardiology

## 2017-12-23 DIAGNOSIS — E7439 Other disorders of intestinal carbohydrate absorption: Secondary | ICD-10-CM | POA: Diagnosis not present

## 2017-12-23 DIAGNOSIS — Z6841 Body Mass Index (BMI) 40.0 and over, adult: Secondary | ICD-10-CM | POA: Diagnosis not present

## 2017-12-23 DIAGNOSIS — Z2821 Immunization not carried out because of patient refusal: Secondary | ICD-10-CM | POA: Diagnosis not present

## 2017-12-23 DIAGNOSIS — D510 Vitamin B12 deficiency anemia due to intrinsic factor deficiency: Secondary | ICD-10-CM | POA: Diagnosis not present

## 2017-12-23 DIAGNOSIS — R55 Syncope and collapse: Secondary | ICD-10-CM | POA: Diagnosis not present

## 2017-12-23 DIAGNOSIS — R42 Dizziness and giddiness: Secondary | ICD-10-CM | POA: Diagnosis not present

## 2017-12-23 DIAGNOSIS — Z23 Encounter for immunization: Secondary | ICD-10-CM | POA: Diagnosis not present

## 2017-12-25 DIAGNOSIS — G894 Chronic pain syndrome: Secondary | ICD-10-CM | POA: Diagnosis not present

## 2017-12-25 DIAGNOSIS — I959 Hypotension, unspecified: Secondary | ICD-10-CM | POA: Insufficient documentation

## 2017-12-25 DIAGNOSIS — M961 Postlaminectomy syndrome, not elsewhere classified: Secondary | ICD-10-CM | POA: Diagnosis not present

## 2017-12-25 DIAGNOSIS — Z79891 Long term (current) use of opiate analgesic: Secondary | ICD-10-CM | POA: Diagnosis not present

## 2017-12-25 DIAGNOSIS — M1612 Unilateral primary osteoarthritis, left hip: Secondary | ICD-10-CM | POA: Diagnosis not present

## 2017-12-25 DIAGNOSIS — F1120 Opioid dependence, uncomplicated: Secondary | ICD-10-CM | POA: Diagnosis not present

## 2017-12-25 DIAGNOSIS — M5416 Radiculopathy, lumbar region: Secondary | ICD-10-CM | POA: Diagnosis not present

## 2017-12-25 DIAGNOSIS — G4733 Obstructive sleep apnea (adult) (pediatric): Secondary | ICD-10-CM | POA: Diagnosis not present

## 2017-12-25 HISTORY — DX: Hypotension, unspecified: I95.9

## 2017-12-25 NOTE — Progress Notes (Signed)
Cardiology Office Note:    Date:  12/26/2017   ID:  David Fitzgerald, DOB 01/07/1961, MRN [id number]  PCP:  Angelina Sheriff, MD  Cardiologist:  Shirlee More, MD    Referring MD: Angelina Sheriff, MD    ASSESSMENT:    1. Persistent atrial fibrillation   2. Hypotension due to drugs   3. Hypertensive heart disease without heart failure   4. Chronic anticoagulation    PLAN:    In order of problems listed above:  1. Stable we will check EKG today continues rate limiting calcium channel blocker beta-blocker and await records from [place] 2. Symptomatic discontinue channel blocker 3. See above worsened with new symptomatic hypotension 4. Continue anticoagulant   Next appointment: 6 months   Medication Adjustments/Labs and Tests Ordered: Current medicines are reviewed at length with the patient today.  Concerns regarding medicines are outlined above.  No orders of the defined types were placed in this encounter.  No orders of the defined types were placed in this encounter.   No chief complaint on file.   History of Present Illness:    David Fitzgerald is a 57 y.o. male with a hx of  atrial fibrillation CHADS2vasc of 2  found to be in rate controlled asymptomatic atrial fibrillation during endoscopy  last seen 03/07/17. Compliance with diet, lifestyle and medications: yes  Take a trip to [place] to [place] for physician while there had an episode where he stood up that he almost fainted he was diaphoretic blood pressure was 80 brought to the hospital was told his lactic acid was elevated given IV fluids and really had no diagnosis at discharge and requesting copy of those records he said that they saw some atrial fibrillation while he was present they made no alteration to his medications he is getting home blood pressure supine in the range of 110 117 in the office today he is lightheaded with a blood pressure of 90 systolic when he stands.  He said no change in medications  no nausea vomiting or diarrhea I am unsure the mechanism but he obviously has symptomatic orthostatic hypotension will stop his calcium channel blocker and hold his ARB for 1 week and reinstitute if his systolic is consistently greater than 140.  He is not having chest pain palpitation shortness of breath or edema he remains anticoagulated he tells me he is not anemic I requested those records he has no pallor and I do not think I need to check a hemoglobin today Past Medical History:  Diagnosis Date  . Allergy to pollen 04/06/2015  . Anxiety 02/06/2017  . Arthritis 02/06/2017  . Bee sting allergy    wasp / mixed vespid  . Bell's palsy 02/24/2013  . Chronic venous insufficiency 12/14/2014  . Diabetes ([place])   . Edema 12/14/2014  . Hyperlipidemia, mixed 12/14/2014  . Hypertension   . Lumbar pain with radiation down left leg 07/09/2012  . Morbid obesity ([place]) 12/14/2014  . Morbid obesity with BMI of 45.0-49.9, adult ([place]) 07/09/2012  . Obesity hypoventilation syndrome ([place]) 12/14/2014  . OSA treated with BiPAP 12/14/2014  . Pain syndrome, chronic 07/09/2012  . Pernicious anemia 02/06/2017  . Polyneuropathy in diabetes ([place]) 02/24/2013  . Syndrome affecting cervical region 02/24/2013  . Trigeminal neuralgia 02/24/2013  . Type 2 diabetes mellitus with diabetic neuropathy ([place]) 12/14/2014    Past Surgical History:  Procedure Laterality Date  . ADENOIDECTOMY    . BACK SURGERY  2002 /2003  .  CARPAL TUNNEL RELEASE Bilateral   . KNEE SURGERY Left 2005  . SHOULDER SURGERY Left 1998  . TONSILLECTOMY      Current Medications: Current Meds  Medication Sig  . amLODipine (NORVASC) 5 MG tablet Take 5 mg by mouth daily.  Marland Kitchen apixaban (ELIQUIS) 5 MG TABS tablet Take 1 tablet (5 mg total) by mouth 2 (two) times daily.  . Cyanocobalamin (VITAMIN B-12) 2500 MCG SUBL Place 1 tablet under the tongue daily.  Marland Kitchen dicyclomine (BENTYL) 20 MG tablet Take 1 tablet by mouth 2 (two) times daily.  . DULoxetine (CYMBALTA) 60 MG  capsule daily.  Marland Kitchen EPINEPHrine 0.3 mg/0.3 mL IJ SOAJ injection Inject into the muscle once.  . furosemide (LASIX) 40 MG tablet Take 40 mg by mouth daily as needed.   . gabapentin (NEURONTIN) 300 MG capsule Take 600 mg by mouth 3 (three) times daily.   Marland Kitchen ibuprofen (ADVIL,MOTRIN) 200 MG tablet Take 200 mg by mouth every 8 (eight) hours as needed.  Marland Kitchen losartan (COZAAR) 100 MG tablet Take 100 mg by mouth daily.  . metFORMIN (GLUCOPHAGE) 500 MG tablet Take 500 mg by mouth daily with breakfast.  . metoprolol tartrate (LOPRESSOR) 25 MG tablet Take 1 tablet (25 mg total) by mouth 3 (three) times daily.  Marland Kitchen MIXED VESPID VENOM PROTEIN [place] Inject into the skin.  Marland Kitchen nitroGLYCERIN (NITROSTAT) 0.4 MG SL tablet Place 0.4 mg under the tongue every 5 (five) minutes as needed. Reported on 04/26/2015  . Omega-3 Fatty Acids (FISH OIL) 1000 MG CAPS Take 1 capsule by mouth daily.  . ondansetron (ZOFRAN-ODT) 4 MG disintegrating tablet Take 4 mg by mouth every 8 (eight) hours as needed for nausea or vomiting.  Marland Kitchen oxyCODONE-acetaminophen (PERCOCET) 7.5-325 MG tablet Take 1 tablet by mouth every 8 (eight) hours as needed for severe pain.  . simvastatin (ZOCOR) 20 MG tablet Take 20 mg by mouth daily.  . VENOMIL WASP VENOM IJ Inject as directed.     Allergies:   Penicillins; Aspirin; and Bee venom   Social History   Socioeconomic History  . Marital status: Married    Spouse name: Not on file  . Number of children: Not on file  . Years of education: Not on file  . Highest education level: Not on file  Occupational History  . Not on file  Social Needs  . Financial resource strain: Not on file  . Food insecurity:    Worry: Not on file    Inability: Not on file  . Transportation needs:    Medical: Not on file    Non-medical: Not on file  Tobacco Use  . Smoking status: Former Smoker    Packs/day: 3.00    Years: 35.00    Pack years: 105.00    Types: Cigarettes    Last attempt to quit: 04/13/2006    Years since  quitting: 11.7  . Smokeless tobacco: Never Used  Substance and Sexual Activity  . Alcohol use: No    Alcohol/week: 0.0 standard drinks  . Drug use: No  . Sexual activity: Not on file  Lifestyle  . Physical activity:    Days per week: Not on file    Minutes per session: Not on file  . Stress: Not on file  Relationships  . Social connections:    Talks on phone: Not on file    Gets together: Not on file    Attends religious service: Not on file    Active member of club or organization: Not on  file    Attends meetings of clubs or organizations: Not on file    Relationship status: Not on file  Other Topics Concern  . Not on file  Social History Narrative  . Not on file     Family History: The patient's family history includes Atrial fibrillation in his father; Breast cancer in his maternal grandmother and mother; CAD in his father; Heart disease in his father and paternal uncle; Stomach cancer in his paternal aunt. ROS:   Please see the history of present illness.    All other systems reviewed and are negative.  EKGs/Labs/Other Studies Reviewed:    The following studies were reviewed today:  EKG:  EKG ordered today.  The ekg ordered today demonstrates atrial fibrillation with nonspecific conduction delay unchanged from January 2019  Recent Labs: No results found for requested labs within last 8760 hours.  Recent Lipid Panel No results found for: CHOL, TRIG, HDL, CHOLHDL, VLDL, LDLCALC, LDLDIRECT  Physical Exam:    VS:  BP 124/88 (BP Location: Right Arm, Patient Position: Sitting, Cuff Size: Large)   Pulse 82   Ht 6\' 2"  (1.88 m)   Wt (!) 379 lb 6.4 oz (172.1 kg)   SpO2 92%   BMI 48.71 kg/m     Wt Readings from Last 3 Encounters:  12/26/17 (!) 379 lb 6.4 oz (172.1 kg)  03/07/17 (!) 378 lb 1.9 oz (171.5 kg)  02/07/17 (!) 379 lb 1.3 oz (171.9 kg)     GEN: Marked obesity well nourished, well developed in no acute distress HEENT: Normal NECK: No JVD; No carotid  bruits LYMPHATICS: No lymphadenopathy CARDIAC: RRR, no murmurs, rubs, gallops RESPIRATORY:  Clear to auscultation without rales, wheezing or rhonchi  ABDOMEN: Soft, non-tender, non-distended MUSCULOSKELETAL:  No edema; No deformity  SKIN: Warm and dry NEUROLOGIC:  Alert and oriented x 3 PSYCHIATRIC:  Normal affect    Signed, Shirlee More, MD  12/26/2017 9:49 AM    [place]

## 2017-12-26 ENCOUNTER — Ambulatory Visit (INDEPENDENT_AMBULATORY_CARE_PROVIDER_SITE_OTHER): Payer: Medicare HMO | Admitting: Cardiology

## 2017-12-26 VITALS — BP 124/88 | HR 82 | Ht 74.0 in | Wt 379.4 lb

## 2017-12-26 DIAGNOSIS — Z7901 Long term (current) use of anticoagulants: Secondary | ICD-10-CM

## 2017-12-26 DIAGNOSIS — I952 Hypotension due to drugs: Secondary | ICD-10-CM

## 2017-12-26 DIAGNOSIS — I119 Hypertensive heart disease without heart failure: Secondary | ICD-10-CM | POA: Diagnosis not present

## 2017-12-26 DIAGNOSIS — I4819 Other persistent atrial fibrillation: Secondary | ICD-10-CM

## 2017-12-26 NOTE — Patient Instructions (Signed)
Medication Instructions:  Your physician has recommended you make the following change in your medication:  STOP: amlodipine STOP: losartan  RESTART: losartan after 1 week if the top number of your blood pressure is consistently greater than 140  If you need a refill on your cardiac medications before your next appointment, please call your pharmacy.   Lab work: NONE If you have labs (blood work) drawn today and your tests are completely normal, you will receive your results only by: Marland Kitchen MyChart Message (if you have MyChart) OR . A paper copy in the mail If you have any lab test that is abnormal or we need to change your treatment, we will call you to review the results.  Testing/Procedures: You had an EKG today  Follow-Up: At [facility], you and your health needs are our priority.  As part of our continuing mission to provide you with exceptional heart care, we have created designated Provider Care Teams.  These Care Teams include your primary Cardiologist (physician) and Advanced Practice Providers (APPs -  Physician Assistants and Nurse Practitioners) who all work together to provide you with the care you need, when you need it. . You will need a follow up appointment in 6 months

## 2018-01-08 ENCOUNTER — Ambulatory Visit (INDEPENDENT_AMBULATORY_CARE_PROVIDER_SITE_OTHER): Payer: Medicare HMO | Admitting: *Deleted

## 2018-01-08 DIAGNOSIS — T63441D Toxic effect of venom of bees, accidental (unintentional), subsequent encounter: Secondary | ICD-10-CM | POA: Diagnosis not present

## 2018-01-08 DIAGNOSIS — IMO0001 Reserved for inherently not codable concepts without codable children: Secondary | ICD-10-CM

## 2018-01-13 DIAGNOSIS — R195 Other fecal abnormalities: Secondary | ICD-10-CM | POA: Diagnosis not present

## 2018-01-13 DIAGNOSIS — R1084 Generalized abdominal pain: Secondary | ICD-10-CM | POA: Diagnosis not present

## 2018-01-21 DIAGNOSIS — E1140 Type 2 diabetes mellitus with diabetic neuropathy, unspecified: Secondary | ICD-10-CM | POA: Diagnosis not present

## 2018-01-21 DIAGNOSIS — M1612 Unilateral primary osteoarthritis, left hip: Secondary | ICD-10-CM | POA: Diagnosis not present

## 2018-01-21 DIAGNOSIS — Z79891 Long term (current) use of opiate analgesic: Secondary | ICD-10-CM | POA: Diagnosis not present

## 2018-01-21 DIAGNOSIS — M5416 Radiculopathy, lumbar region: Secondary | ICD-10-CM | POA: Diagnosis not present

## 2018-01-21 DIAGNOSIS — G894 Chronic pain syndrome: Secondary | ICD-10-CM | POA: Diagnosis not present

## 2018-01-21 DIAGNOSIS — M961 Postlaminectomy syndrome, not elsewhere classified: Secondary | ICD-10-CM | POA: Diagnosis not present

## 2018-01-21 DIAGNOSIS — G4733 Obstructive sleep apnea (adult) (pediatric): Secondary | ICD-10-CM | POA: Diagnosis not present

## 2018-01-21 DIAGNOSIS — F1120 Opioid dependence, uncomplicated: Secondary | ICD-10-CM | POA: Diagnosis not present

## 2018-01-30 DIAGNOSIS — J329 Chronic sinusitis, unspecified: Secondary | ICD-10-CM | POA: Diagnosis not present

## 2018-01-30 DIAGNOSIS — J40 Bronchitis, not specified as acute or chronic: Secondary | ICD-10-CM | POA: Diagnosis not present

## 2018-02-09 DIAGNOSIS — R05 Cough: Secondary | ICD-10-CM | POA: Diagnosis not present

## 2018-02-09 DIAGNOSIS — Z6841 Body Mass Index (BMI) 40.0 and over, adult: Secondary | ICD-10-CM | POA: Diagnosis not present

## 2018-02-27 DIAGNOSIS — G894 Chronic pain syndrome: Secondary | ICD-10-CM | POA: Diagnosis not present

## 2018-02-27 DIAGNOSIS — G4733 Obstructive sleep apnea (adult) (pediatric): Secondary | ICD-10-CM | POA: Diagnosis not present

## 2018-02-27 DIAGNOSIS — Z79891 Long term (current) use of opiate analgesic: Secondary | ICD-10-CM | POA: Diagnosis not present

## 2018-02-27 DIAGNOSIS — F1120 Opioid dependence, uncomplicated: Secondary | ICD-10-CM | POA: Diagnosis not present

## 2018-02-27 DIAGNOSIS — M47818 Spondylosis without myelopathy or radiculopathy, sacral and sacrococcygeal region: Secondary | ICD-10-CM | POA: Diagnosis not present

## 2018-02-27 DIAGNOSIS — M961 Postlaminectomy syndrome, not elsewhere classified: Secondary | ICD-10-CM | POA: Diagnosis not present

## 2018-02-27 DIAGNOSIS — M5416 Radiculopathy, lumbar region: Secondary | ICD-10-CM | POA: Diagnosis not present

## 2018-02-27 DIAGNOSIS — M1612 Unilateral primary osteoarthritis, left hip: Secondary | ICD-10-CM | POA: Diagnosis not present

## 2018-03-05 ENCOUNTER — Ambulatory Visit: Payer: Self-pay

## 2018-03-12 ENCOUNTER — Ambulatory Visit (INDEPENDENT_AMBULATORY_CARE_PROVIDER_SITE_OTHER): Payer: Medicare HMO | Admitting: *Deleted

## 2018-03-12 DIAGNOSIS — IMO0001 Reserved for inherently not codable concepts without codable children: Secondary | ICD-10-CM

## 2018-03-12 DIAGNOSIS — T63441D Toxic effect of venom of bees, accidental (unintentional), subsequent encounter: Secondary | ICD-10-CM

## 2018-03-30 DIAGNOSIS — Z79891 Long term (current) use of opiate analgesic: Secondary | ICD-10-CM | POA: Diagnosis not present

## 2018-03-30 DIAGNOSIS — G4733 Obstructive sleep apnea (adult) (pediatric): Secondary | ICD-10-CM | POA: Diagnosis not present

## 2018-03-30 DIAGNOSIS — M961 Postlaminectomy syndrome, not elsewhere classified: Secondary | ICD-10-CM | POA: Diagnosis not present

## 2018-03-30 DIAGNOSIS — M47818 Spondylosis without myelopathy or radiculopathy, sacral and sacrococcygeal region: Secondary | ICD-10-CM | POA: Diagnosis not present

## 2018-03-30 DIAGNOSIS — F1120 Opioid dependence, uncomplicated: Secondary | ICD-10-CM | POA: Diagnosis not present

## 2018-03-30 DIAGNOSIS — M1612 Unilateral primary osteoarthritis, left hip: Secondary | ICD-10-CM | POA: Diagnosis not present

## 2018-03-30 DIAGNOSIS — G894 Chronic pain syndrome: Secondary | ICD-10-CM | POA: Diagnosis not present

## 2018-03-30 DIAGNOSIS — M5416 Radiculopathy, lumbar region: Secondary | ICD-10-CM | POA: Diagnosis not present

## 2018-04-23 DIAGNOSIS — M1612 Unilateral primary osteoarthritis, left hip: Secondary | ICD-10-CM | POA: Diagnosis not present

## 2018-04-23 DIAGNOSIS — Z79891 Long term (current) use of opiate analgesic: Secondary | ICD-10-CM | POA: Diagnosis not present

## 2018-04-23 DIAGNOSIS — M5416 Radiculopathy, lumbar region: Secondary | ICD-10-CM | POA: Diagnosis not present

## 2018-04-23 DIAGNOSIS — G894 Chronic pain syndrome: Secondary | ICD-10-CM | POA: Diagnosis not present

## 2018-04-23 DIAGNOSIS — G4733 Obstructive sleep apnea (adult) (pediatric): Secondary | ICD-10-CM | POA: Diagnosis not present

## 2018-04-23 DIAGNOSIS — F1120 Opioid dependence, uncomplicated: Secondary | ICD-10-CM | POA: Diagnosis not present

## 2018-04-23 DIAGNOSIS — M961 Postlaminectomy syndrome, not elsewhere classified: Secondary | ICD-10-CM | POA: Diagnosis not present

## 2018-04-23 DIAGNOSIS — M47818 Spondylosis without myelopathy or radiculopathy, sacral and sacrococcygeal region: Secondary | ICD-10-CM | POA: Diagnosis not present

## 2018-04-30 ENCOUNTER — Ambulatory Visit: Payer: Medicare HMO | Admitting: Allergy and Immunology

## 2018-05-07 ENCOUNTER — Ambulatory Visit: Payer: Self-pay

## 2018-05-11 DIAGNOSIS — R143 Flatulence: Secondary | ICD-10-CM | POA: Diagnosis not present

## 2018-05-11 DIAGNOSIS — R12 Heartburn: Secondary | ICD-10-CM | POA: Diagnosis not present

## 2018-05-13 ENCOUNTER — Telehealth: Payer: Self-pay | Admitting: Cardiology

## 2018-05-13 NOTE — Telephone Encounter (Signed)
Cardiac Questionnaire:    Since your last visit or hospitalization:    1. Have you been having new or worsening chest pain? no   2. Have you been having new or worsening shortness of breath?no 3. Have you been having new or worsening leg swelling, wt gain, or increase in abdominal girth (pants fitting more tightly)? no   4. Have you had any passing out spells? no    *A YES to any of these questions would result in the appointment being kept. *If all the answers to these questions are NO, we should indicate that given the current situation regarding the worldwide coronarvirus pandemic, at the recommendation of the CDC, we are looking to limit gatherings in our waiting area, and thus will reschedule their appointment beyond four weeks from today.   _____________   FKCLE-75 Pre-Screening Questions:   Do you currently have a fever? no  Have you recently travelled on a cruise, internationally, or to [place], [place], [place], [place], or [place], [place]) ? no  Have you been in contact with someone that is currently pending confirmation of Covid19 testing or has been confirmed to have the [place] virus?  no  Are you currently experiencing fatigue or cough? No  YOUR CARDIOLOGY TEAM HAS ARRANGED FOR AN E-VISIT FOR YOUR APPOINTMENT - PLEASE REVIEW IMPORTANT INFORMATION BELOW SEVERAL DAYS PRIOR TO YOUR APPOINTMENT  Due to the recent COVID-19 pandemic, we are transitioning in-person office visits to tele-medicine visits in an effort to decrease unnecessary exposure to our patients and staff. Medicare and most insurances are covering these visits without a copay needed. We also encourage you to sign up for MyChart if you have not already done so. You will need a smartphone if possible. For patients that do not have this, we can still complete the visit using a regular telephone but do prefer a smartphone to enable video when possible. You may have a close family member that lives with you that can  help. If possible, we also ask that you have a blood pressure cuff and scale at home to measure your blood pressure, heart rate and weight prior to your scheduled appointment. Patients with clinical needs that need an in-person evaluation and testing will still be able to come to the office if absolutely necessary. If you have any questions, feel free to call our office.    CONSENT FOR TELE-HEALTH VISIT - PLEASE REVIEW  I hereby voluntarily request, consent and authorize [facility] and its employed or contracted physicians, physician assistants, nurse practitioners or other licensed health care professionals (the Practitioner), to provide me with telemedicine health care services (the Services") as deemed necessary by the treating Practitioner. I acknowledge and consent to receive the Services by the Practitioner via telemedicine. I understand that the telemedicine visit will involve communicating with the Practitioner through live audiovisual communication technology and the disclosure of certain medical information by electronic transmission. I acknowledge that I have been given the opportunity to request an in-person assessment or other available alternative prior to the telemedicine visit and am voluntarily participating in the telemedicine visit.  I understand that I have the right to withhold or withdraw my consent to the use of telemedicine in the course of my care at any time, without affecting my right to future care or treatment, and that the Practitioner or I may terminate the telemedicine visit at any time. I understand that I have the right to inspect all information obtained and/or recorded in the course  of the telemedicine visit and may receive copies of available information for a reasonable fee.  I understand that some of the potential risks of receiving the Services via telemedicine include:   Delay or interruption in medical evaluation due to technological equipment failure or  disruption;  Information transmitted may not be sufficient (e.g. poor resolution of images) to allow for appropriate medical decision making by the Practitioner; and/or   In rare instances, security protocols could fail, causing a breach of personal health information.  Furthermore, I acknowledge that it is my responsibility to provide information about my medical history, conditions and care that is complete and accurate to the best of my ability. I acknowledge that Practitioner's advice, recommendations, and/or decision may be based on factors not within their control, such as incomplete or inaccurate data provided by me or distortions of diagnostic images or specimens that may result from electronic transmissions. I understand that the practice of medicine is not an exact science and that Practitioner makes no warranties or guarantees regarding treatment outcomes. I acknowledge that I will receive a copy of this consent concurrently upon execution via email to the email address I last provided but may also request a printed copy by calling the office of [facility].    I understand that my insurance will be billed for this visit.   I have read or had this consent read to me.  I understand the contents of this consent, which adequately explains the benefits and risks of the Services being provided via telemedicine.   I have been provided ample opportunity to ask questions regarding this consent and the Services and have had my questions answered to my satisfaction.  I give my informed consent for the services to be provided through the use of telemedicine in my medical care  By participating in this telemedicine visit I agree to the above.  Patient gives verbal consent for televisit 05/13/2018 pp

## 2018-05-19 ENCOUNTER — Telehealth (INDEPENDENT_AMBULATORY_CARE_PROVIDER_SITE_OTHER): Payer: Medicare HMO | Admitting: Cardiology

## 2018-05-19 ENCOUNTER — Encounter: Payer: Self-pay | Admitting: Cardiology

## 2018-05-19 ENCOUNTER — Other Ambulatory Visit: Payer: Self-pay

## 2018-05-19 VITALS — BP 128/82

## 2018-05-19 DIAGNOSIS — I119 Hypertensive heart disease without heart failure: Secondary | ICD-10-CM

## 2018-05-19 DIAGNOSIS — I952 Hypotension due to drugs: Secondary | ICD-10-CM

## 2018-05-19 DIAGNOSIS — E1140 Type 2 diabetes mellitus with diabetic neuropathy, unspecified: Secondary | ICD-10-CM

## 2018-05-19 DIAGNOSIS — E785 Hyperlipidemia, unspecified: Secondary | ICD-10-CM | POA: Diagnosis not present

## 2018-05-19 DIAGNOSIS — E669 Obesity, unspecified: Secondary | ICD-10-CM

## 2018-05-19 DIAGNOSIS — E119 Type 2 diabetes mellitus without complications: Secondary | ICD-10-CM

## 2018-05-19 DIAGNOSIS — Z7901 Long term (current) use of anticoagulants: Secondary | ICD-10-CM

## 2018-05-19 DIAGNOSIS — I10 Essential (primary) hypertension: Secondary | ICD-10-CM | POA: Diagnosis not present

## 2018-05-19 DIAGNOSIS — G4733 Obstructive sleep apnea (adult) (pediatric): Secondary | ICD-10-CM

## 2018-05-19 DIAGNOSIS — E662 Morbid (severe) obesity with alveolar hypoventilation: Secondary | ICD-10-CM

## 2018-05-19 DIAGNOSIS — Z87891 Personal history of nicotine dependence: Secondary | ICD-10-CM | POA: Diagnosis not present

## 2018-05-19 DIAGNOSIS — I4819 Other persistent atrial fibrillation: Secondary | ICD-10-CM | POA: Diagnosis not present

## 2018-05-19 NOTE — Progress Notes (Signed)
Virtual Visit via Telephone Note   This visit type was conducted due to national recommendations for restrictions regarding the COVID-19 Pandemic (e.g. social distancing) in an effort to limit this patient's exposure and mitigate transmission in our community.  Due to his co-morbid illnesses, this patient is at least at moderate risk for complications without adequate follow up.  This format is felt to be most appropriate for this patient at this time.  The patient did not have access to video technology/had technical difficulties with video requiring transitioning to audio format only (telephone).  All issues noted in this document were discussed and addressed.  No physical exam could be performed with this format.  Please refer to the patient's chart for his  consent to telehealth for [facility].   Evaluation Performed:  Follow-up visit  Date:  05/19/2018   ID:  David Fitzgerald, DOB [birth year], MRN [id number]  Patient Location: Home  Provider Location: Office  PCP:  Angelina Sheriff, MD  Cardiologist:  No primary care provider on file. [facility] Electrophysiologist:  None   Chief Complaint: Follow-up for atrial fibrillation hypertension after withdrawal of antihypertensive agents  History of Present Illness:    David Fitzgerald is a 58 y.o. male with a hx of  atrial fibrillation CHADS2vasc of 2 hypertension type 2 diabetes and hyperlipidemia last seen 12/26/2017 after brief hospitalization in [place] with hypotension and withdrawal of several antihypertensive medications.  Since his last visit blood pressures been at target 1 20-1 40/ 75-88 and heart rates less than 9200 bpm.  He has had no lightheadedness or syncope edema shortness of breath bleeding from his anticoagulant or TIA.  He has been seen within the [facility] health medical group by pulmonary for obstructive sleep apnea and hypoventilation and I will renew his BiPAP and oxygen if able.   The patient does not have symptoms  concerning for COVID-19 infection (fever, chills, cough, or new shortness of breath).    Past Medical History:  Diagnosis Date  . Allergy to pollen 04/06/2015  . Anxiety 02/06/2017  . Arthritis 02/06/2017  . Bee sting allergy    wasp / mixed vespid  . Bell's palsy 02/24/2013  . Chronic venous insufficiency 12/14/2014  . Diabetes ([place])   . Edema 12/14/2014  . Hyperlipidemia, mixed 12/14/2014  . Hypertension   . Lumbar pain with radiation down left leg 07/09/2012  . Morbid obesity ([place]) 12/14/2014  . Morbid obesity with BMI of 45.0-49.9, adult ([place]) 07/09/2012  . Obesity hypoventilation syndrome ([place]) 12/14/2014  . OSA treated with BiPAP 12/14/2014  . Pain syndrome, chronic 07/09/2012  . Pernicious anemia 02/06/2017  . Polyneuropathy in diabetes ([place]) 02/24/2013  . Syndrome affecting cervical region 02/24/2013  . Trigeminal neuralgia 02/24/2013  . Type 2 diabetes mellitus with diabetic neuropathy ([place]) 12/14/2014   Past Surgical History:  Procedure Laterality Date  . ADENOIDECTOMY    . BACK SURGERY  2002 /2003  . CARPAL TUNNEL RELEASE Bilateral   . KNEE SURGERY Left 2005  . SHOULDER SURGERY Left 1998  . TONSILLECTOMY       Current Meds  Medication Sig  . apixaban (ELIQUIS) 5 MG TABS tablet Take 1 tablet (5 mg total) by mouth 2 (two) times daily.  . Cyanocobalamin (VITAMIN B-12) 2500 MCG SUBL Place 1 tablet under the tongue daily.  Marland Kitchen dicyclomine (BENTYL) 20 MG tablet Take 1 tablet by mouth 2 (two) times daily.  . DULoxetine (CYMBALTA) 60 MG capsule Take 60 mg by mouth daily.   Marland Kitchen  EPINEPHrine 0.3 mg/0.3 mL IJ SOAJ injection Inject into the muscle once.  . furosemide (LASIX) 40 MG tablet Take 40 mg by mouth daily as needed.   . gabapentin (NEURONTIN) 300 MG capsule Take 600 mg by mouth 3 (three) times daily.   Marland Kitchen ibuprofen (ADVIL,MOTRIN) 200 MG tablet Take 200 mg by mouth every 8 (eight) hours as needed.  . metFORMIN (GLUCOPHAGE) 500 MG tablet Take 500 mg by mouth daily with breakfast.  . metoprolol  tartrate (LOPRESSOR) 25 MG tablet Take 1 tablet (25 mg total) by mouth 3 (three) times daily.  Marland Kitchen MIXED VESPID VENOM PROTEIN [place] Inject into the skin.  Marland Kitchen nitroGLYCERIN (NITROSTAT) 0.4 MG SL tablet Place 0.4 mg under the tongue every 5 (five) minutes as needed. Reported on 04/26/2015  . Omega-3 Fatty Acids (FISH OIL) 1000 MG CAPS Take 1 capsule by mouth daily.  . ondansetron (ZOFRAN-ODT) 4 MG disintegrating tablet Take 4 mg by mouth every 8 (eight) hours as needed for nausea or vomiting.  Marland Kitchen oxyCODONE-acetaminophen (PERCOCET) 7.5-325 MG tablet Take 1 tablet by mouth every 8 (eight) hours as needed for severe pain.  . pantoprazole (PROTONIX) 40 MG tablet TK 1 T PO D  . simvastatin (ZOCOR) 20 MG tablet Take 20 mg by mouth daily.  . VENOMIL WASP VENOM IJ Inject as directed.     Allergies:   Penicillins; Aspirin; and Bee venom   Social History   Tobacco Use  . Smoking status: Former Smoker    Packs/day: 3.00    Years: 35.00    Pack years: 105.00    Types: Cigarettes    Last attempt to quit: 04/13/2006    Years since quitting: 12.1  . Smokeless tobacco: Never Used  Substance Use Topics  . Alcohol use: No    Alcohol/week: 0.0 standard drinks  . Drug use: No     Family Hx: The patient's family history includes Atrial fibrillation in his father; Breast cancer in his maternal grandmother and mother; CAD in his father; Heart disease in his father and paternal uncle; Stomach cancer in his paternal aunt.  ROS:   Please see the history of present illness.     All other systems reviewed and are negative.   Prior CV studies:   The following studies were reviewed today:  Echo 12/11/17: Study Conclusions  - Left ventricle: The cavity size was normal. Systolic function was   normal. The estimated ejection fraction was in the range of 55%   to 60%. Wall motion was normal; there were no regional wall   motion abnormalities. - Left atrium: The atrium was mildly to moderately dilated.  Impressions: - Technically difficult and limited study.   Normal LVEF.   Mild to moderate LAE.  Labs/Other Tests and Data Reviewed:    EKG:  No ECG reviewed.  Recent Labs:   From [facility] [place] [facility] [address] thousand 19 initial creatinine was 1.89 declined to 1.04 after IV fluids troponin undetectable N-terminal BNP 1804 his initial white count was elevated but date of discharge his CBC and CMP were completely normal and admission with hypotension blood pressure 91/49 his lactic acid was mildly increased to 0.8.  His last hemoglobin A1c 12/23/2017 was 6.0 LDL cholesterol 69 03/13/2017   Wt Readings from Last 3 Encounters:  12/26/17 (!) 379 lb 6.4 oz (172.1 kg)  03/07/17 (!) 378 lb 1.9 oz (171.5 kg)  02/07/17 (!) 379 lb 1.3 oz (171.9 kg)     Objective:    Vital Signs:  There were no vitals taken for this visit.    male in no acute distress.  He was alert oriented thought and perception were normal   ASSESSMENT & PLAN:    1. Atrial fibrillation persistent stable rate is controlled and continue his current beta-blocker and anticoagulant Eliquis 2. Chronic anticoagulation stable no bleeding problems continue his current anticoagulant 3. Hypertension improved after withdrawal of ARB continue current treatment with beta-blocker and loop diuretic 4. Hyperlipidemia stable continue statin he will need a full CMP lipid profile next visit 5. Type 2 diabetes stable his last hemoglobin A1c 12/23/2017 was 6.0 continue current treatment managed by his PCP 6. Obstructive sleep apnea and obesity hypoventilation syndrome if I am capable of I will renew his CPAP and oxygen  COVID-19 Education: medium COVID 19 risk The signs and symptoms of COVID-19 were discussed with the patient and how to seek care for testing (follow up with PCP or arrange E-visit).  The importance of social distancing was discussed today.  Time:   Today, I have spent 24 minutes with the patient with telehealth  technology discussing the above problems.     Medication Adjustments/Labs and Tests Ordered: Current medicines are reviewed at length with the patient today.  Concerns regarding medicines are outlined above.   Tests Ordered: No orders of the defined types were placed in this encounter.   Medication Changes: No orders of the defined types were placed in this encounter.   Disposition:  Follow up 6 months  Signed, Shirlee More, MD  05/19/2018 3:22 PM    [facility]

## 2018-05-19 NOTE — Patient Instructions (Signed)
Medication Instructions:  Your physician recommends that you continue on your current medications as directed. Please refer to the Current Medication list given to you today.  If you need a refill on your cardiac medications before your next appointment, please call your pharmacy.   Lab work: None If you have labs (blood work) drawn today and your tests are completely normal, you will receive your results only by: Marland Kitchen MyChart Message (if you have MyChart) OR . A paper copy in the mail If you have any lab test that is abnormal or we need to change your treatment, we will call you to review the results.  Testing/Procedures: None  Follow-Up: At [facility], you and your health needs are our priority.  As part of our continuing mission to provide you with exceptional heart care, we have created designated Provider Care Teams.  These Care Teams include your primary Cardiologist (physician) and Advanced Practice Providers (APPs -  Physician Assistants and Nurse Practitioners) who all work together to provide you with the care you need, when you need it. You will need a follow up appointment in 6 months.  Any Other Special Instructions Will Be Listed Below (If Applicable).

## 2018-05-21 ENCOUNTER — Ambulatory Visit (INDEPENDENT_AMBULATORY_CARE_PROVIDER_SITE_OTHER): Payer: Medicare HMO | Admitting: *Deleted

## 2018-05-21 DIAGNOSIS — M1612 Unilateral primary osteoarthritis, left hip: Secondary | ICD-10-CM | POA: Diagnosis not present

## 2018-05-21 DIAGNOSIS — G894 Chronic pain syndrome: Secondary | ICD-10-CM | POA: Diagnosis not present

## 2018-05-21 DIAGNOSIS — Z79891 Long term (current) use of opiate analgesic: Secondary | ICD-10-CM | POA: Diagnosis not present

## 2018-05-21 DIAGNOSIS — G4733 Obstructive sleep apnea (adult) (pediatric): Secondary | ICD-10-CM | POA: Diagnosis not present

## 2018-05-21 DIAGNOSIS — T63441D Toxic effect of venom of bees, accidental (unintentional), subsequent encounter: Secondary | ICD-10-CM

## 2018-05-21 DIAGNOSIS — IMO0001 Reserved for inherently not codable concepts without codable children: Secondary | ICD-10-CM

## 2018-05-21 DIAGNOSIS — F1120 Opioid dependence, uncomplicated: Secondary | ICD-10-CM | POA: Diagnosis not present

## 2018-05-21 DIAGNOSIS — M961 Postlaminectomy syndrome, not elsewhere classified: Secondary | ICD-10-CM | POA: Diagnosis not present

## 2018-05-21 DIAGNOSIS — M5416 Radiculopathy, lumbar region: Secondary | ICD-10-CM | POA: Diagnosis not present

## 2018-05-21 DIAGNOSIS — M47818 Spondylosis without myelopathy or radiculopathy, sacral and sacrococcygeal region: Secondary | ICD-10-CM | POA: Diagnosis not present

## 2018-05-26 ENCOUNTER — Telehealth: Payer: Self-pay | Admitting: Cardiology

## 2018-05-26 MED ORDER — APIXABAN 5 MG PO TABS: 5.0000 mg | ORAL_TABLET | Freq: Two times a day (BID) | ORAL | 1 refills | Status: DC

## 2018-05-26 NOTE — Telephone Encounter (Signed)
°*  STAT* If patient is at the pharmacy, call can be transferred to refill team.   1. Which medications need to be refilled? (please list name of each medication and dose if known) apixaban (ELIQUIS) 5 MG TABS tablet   2. Which pharmacy/location (including street and city if local pharmacy) is medication to be sent to?  [place], [place] - [address] [phone] (Phone) [phone] (Fax)     3. Do they need a 30 day or 90 day supply? 90 day

## 2018-06-10 DIAGNOSIS — G4733 Obstructive sleep apnea (adult) (pediatric): Secondary | ICD-10-CM | POA: Diagnosis not present

## 2018-06-18 DIAGNOSIS — Z79891 Long term (current) use of opiate analgesic: Secondary | ICD-10-CM | POA: Diagnosis not present

## 2018-06-18 DIAGNOSIS — M47818 Spondylosis without myelopathy or radiculopathy, sacral and sacrococcygeal region: Secondary | ICD-10-CM | POA: Diagnosis not present

## 2018-06-18 DIAGNOSIS — G4733 Obstructive sleep apnea (adult) (pediatric): Secondary | ICD-10-CM | POA: Diagnosis not present

## 2018-06-18 DIAGNOSIS — M5416 Radiculopathy, lumbar region: Secondary | ICD-10-CM | POA: Diagnosis not present

## 2018-06-18 DIAGNOSIS — G894 Chronic pain syndrome: Secondary | ICD-10-CM | POA: Diagnosis not present

## 2018-06-18 DIAGNOSIS — M961 Postlaminectomy syndrome, not elsewhere classified: Secondary | ICD-10-CM | POA: Diagnosis not present

## 2018-06-18 DIAGNOSIS — M1612 Unilateral primary osteoarthritis, left hip: Secondary | ICD-10-CM | POA: Diagnosis not present

## 2018-07-11 DIAGNOSIS — G4733 Obstructive sleep apnea (adult) (pediatric): Secondary | ICD-10-CM | POA: Diagnosis not present

## 2018-07-16 ENCOUNTER — Ambulatory Visit: Payer: Self-pay

## 2018-07-16 ENCOUNTER — Ambulatory Visit (INDEPENDENT_AMBULATORY_CARE_PROVIDER_SITE_OTHER): Payer: Medicare HMO

## 2018-07-16 DIAGNOSIS — M1612 Unilateral primary osteoarthritis, left hip: Secondary | ICD-10-CM | POA: Diagnosis not present

## 2018-07-16 DIAGNOSIS — M961 Postlaminectomy syndrome, not elsewhere classified: Secondary | ICD-10-CM | POA: Diagnosis not present

## 2018-07-16 DIAGNOSIS — Z79891 Long term (current) use of opiate analgesic: Secondary | ICD-10-CM | POA: Diagnosis not present

## 2018-07-16 DIAGNOSIS — G4733 Obstructive sleep apnea (adult) (pediatric): Secondary | ICD-10-CM | POA: Diagnosis not present

## 2018-07-16 DIAGNOSIS — T63441D Toxic effect of venom of bees, accidental (unintentional), subsequent encounter: Secondary | ICD-10-CM | POA: Diagnosis not present

## 2018-07-16 DIAGNOSIS — M47818 Spondylosis without myelopathy or radiculopathy, sacral and sacrococcygeal region: Secondary | ICD-10-CM | POA: Diagnosis not present

## 2018-07-16 DIAGNOSIS — M5416 Radiculopathy, lumbar region: Secondary | ICD-10-CM | POA: Diagnosis not present

## 2018-07-16 DIAGNOSIS — G894 Chronic pain syndrome: Secondary | ICD-10-CM | POA: Diagnosis not present

## 2018-07-17 DIAGNOSIS — E7439 Other disorders of intestinal carbohydrate absorption: Secondary | ICD-10-CM | POA: Diagnosis not present

## 2018-07-17 DIAGNOSIS — Z125 Encounter for screening for malignant neoplasm of prostate: Secondary | ICD-10-CM | POA: Diagnosis not present

## 2018-07-17 DIAGNOSIS — E785 Hyperlipidemia, unspecified: Secondary | ICD-10-CM | POA: Diagnosis not present

## 2018-07-17 DIAGNOSIS — Z79899 Other long term (current) drug therapy: Secondary | ICD-10-CM | POA: Diagnosis not present

## 2018-07-17 DIAGNOSIS — R5383 Other fatigue: Secondary | ICD-10-CM | POA: Diagnosis not present

## 2018-07-17 DIAGNOSIS — M545 Low back pain: Secondary | ICD-10-CM | POA: Diagnosis not present

## 2018-07-17 DIAGNOSIS — Z6841 Body Mass Index (BMI) 40.0 and over, adult: Secondary | ICD-10-CM | POA: Diagnosis not present

## 2018-07-17 DIAGNOSIS — Z981 Arthrodesis status: Secondary | ICD-10-CM | POA: Diagnosis not present

## 2018-07-17 DIAGNOSIS — I10 Essential (primary) hypertension: Secondary | ICD-10-CM | POA: Diagnosis not present

## 2018-07-23 ENCOUNTER — Ambulatory Visit (INDEPENDENT_AMBULATORY_CARE_PROVIDER_SITE_OTHER): Payer: Medicare HMO | Admitting: Allergy and Immunology

## 2018-07-23 ENCOUNTER — Other Ambulatory Visit: Payer: Self-pay

## 2018-07-23 ENCOUNTER — Encounter: Payer: Self-pay | Admitting: Allergy and Immunology

## 2018-07-23 VITALS — BP 142/94 | HR 72 | Temp 98.7°F | Resp 18

## 2018-07-23 DIAGNOSIS — IMO0001 Reserved for inherently not codable concepts without codable children: Secondary | ICD-10-CM

## 2018-07-23 DIAGNOSIS — T63441D Toxic effect of venom of bees, accidental (unintentional), subsequent encounter: Secondary | ICD-10-CM | POA: Diagnosis not present

## 2018-07-23 MED ORDER — EPINEPHRINE 0.3 MG/0.3ML IJ SOAJ: INTRAMUSCULAR | 3 refills | Status: DC

## 2018-07-23 NOTE — Patient Instructions (Signed)
  1.  Continue immunotherapy (and EpiPen) ° °2.  Return to clinic in 1 year or earlier if problem °

## 2018-07-23 NOTE — Progress Notes (Signed)
[name] [facility] [place] [facility] [place]   Follow-up Note  Referring Provider: Angelina Sheriff, MD Primary Provider: Angelina Sheriff, MD Date of Office Visit: 07/23/2018  Subjective:   David Fitzgerald (DOB: 02-Dec-1960) is a 58 y.o. male who returns to the [facility] [place] on 07/23/2018 in re-evaluation of the following:  HPI: David Fitzgerald returns to this clinic in evaluation of hymenoptera venom hypersensitivity state.  He was last seen in this clinic on 28 April 2017.  He has had another very good year while utilizing immunotherapy currently at every 8 weeks directed against hymenoptera hypersensitivity.  He has been stung several times by wasp and has not developed either a systemic reaction or even a large local reaction.  Allergies as of 07/23/2018      Reactions   Penicillins Swelling   Aspirin    Relative contraindication due to hematochezia while on it   Bee Venom       Medication List      apixaban 5 MG Tabs tablet Commonly known as: Eliquis Take 1 tablet (5 mg total) by mouth 2 (two) times daily.   dicyclomine 20 MG tablet Commonly known as: BENTYL Take 1 tablet by mouth 2 (two) times daily.   DULoxetine 60 MG capsule Commonly known as: CYMBALTA Take 60 mg by mouth daily.   EPINEPHrine 0.3 mg/0.3 mL [place] injection Commonly known as: EPI-PEN Use for life-threatening allergic reactions What changed:   how to take this  when to take this  additional instructions Changed by: Zaki Gertsch Kevan Rosebush, MD   Fish Oil 1000 MG Caps Take 1 capsule by mouth daily.   furosemide 40 MG tablet Commonly known as: LASIX Take 40 mg by mouth daily as needed.   gabapentin 300 MG capsule Commonly known as: NEURONTIN Take 600 mg by mouth 3 (three) times daily.   ibuprofen 200 MG tablet Commonly known as: ADVIL Take 200 mg by mouth every 8 (eight) hours as needed.   metFORMIN 500 MG tablet Commonly known as: GLUCOPHAGE Take 500 mg by mouth  daily with breakfast.   metoprolol tartrate 25 MG tablet Commonly known as: LOPRESSOR Take 1 tablet (25 mg total) by mouth 3 (three) times daily.   MIXED VESPID VENOM PROTEIN [place] Inject into the skin.   Nitrostat 0.4 MG SL tablet Generic drug: nitroGLYCERIN Place 0.4 mg under the tongue every 5 (five) minutes as needed. Reported on 04/26/2015   ondansetron 4 MG disintegrating tablet Commonly known as: ZOFRAN-ODT Take 4 mg by mouth every 8 (eight) hours as needed for nausea or vomiting.   oxyCODONE-acetaminophen 7.5-325 MG tablet Commonly known as: PERCOCET Take 1 tablet by mouth every 8 (eight) hours as needed for severe pain.   pantoprazole 40 MG tablet Commonly known as: PROTONIX TK 1 T PO D   simvastatin 20 MG tablet Commonly known as: ZOCOR Take 20 mg by mouth daily.   VENOMIL WASP VENOM IJ Inject as directed.   Vitamin B-12 2500 MCG Subl Place 1 tablet under the tongue daily.       Past Medical History:  Diagnosis Date  . Allergy to pollen 04/06/2015  . Anxiety 02/06/2017  . Arthritis 02/06/2017  . Bee sting allergy    wasp / mixed vespid  . Bell's palsy 02/24/2013  . Chronic venous insufficiency 12/14/2014  . Diabetes ([place])   . Edema 12/14/2014  . Hyperlipidemia, mixed 12/14/2014  . Hypertension   . Lumbar pain with radiation  down left leg 07/09/2012  . Morbid obesity ([place]) 12/14/2014  . Morbid obesity with BMI of 45.0-49.9, adult ([place]) 07/09/2012  . Obesity hypoventilation syndrome ([place]) 12/14/2014  . OSA treated with BiPAP 12/14/2014  . Pain syndrome, chronic 07/09/2012  . Pernicious anemia 02/06/2017  . Polyneuropathy in diabetes ([place]) 02/24/2013  . Syndrome affecting cervical region 02/24/2013  . Trigeminal neuralgia 02/24/2013  . Type 2 diabetes mellitus with diabetic neuropathy ([place]) 12/14/2014    Past Surgical History:  Procedure Laterality Date  . ADENOIDECTOMY    . BACK SURGERY  2002 /2003  . CARPAL TUNNEL RELEASE Bilateral   . KNEE SURGERY Left 2005  .  SHOULDER SURGERY Left 1998  . TONSILLECTOMY      Review of systems negative except as noted in HPI / PMHx or noted below:  Review of Systems  Constitutional: Negative.   HENT: Negative.   Eyes: Negative.   Respiratory: Negative.   Cardiovascular: Negative.   Gastrointestinal: Negative.   Genitourinary: Negative.   Musculoskeletal: Negative.   Skin: Negative.   Neurological: Negative.   Endo/Heme/Allergies: Negative.   Psychiatric/Behavioral: Negative.      Objective:   Vitals:   07/23/18 1548  BP: (!) 142/94  Pulse: 72  Resp: 18  Temp: 98.7 F (37.1 C)          Physical Exam Constitutional:      Appearance: He is not diaphoretic.  HENT:     Head: Normocephalic.     Right Ear: Tympanic membrane, ear canal and external ear normal.     Left Ear: Tympanic membrane, ear canal and external ear normal.     Nose: Nose normal. No mucosal edema or rhinorrhea.     Mouth/Throat:     Pharynx: Uvula midline. No oropharyngeal exudate.  Eyes:     Conjunctiva/sclera: Conjunctivae normal.  Neck:     Thyroid: No thyromegaly.     Trachea: Trachea normal. No tracheal tenderness or tracheal deviation.  Cardiovascular:     Rate and Rhythm: Normal rate and regular rhythm.     Heart sounds: Normal heart sounds, S1 normal and S2 normal. No murmur.  Pulmonary:     Effort: No respiratory distress.     Breath sounds: Normal breath sounds. No stridor. No wheezing or rales.  Lymphadenopathy:     Head:     Right side of head: No tonsillar adenopathy.     Left side of head: No tonsillar adenopathy.     Cervical: No cervical adenopathy.  Skin:    Findings: No erythema or rash.     Nails: There is no clubbing.   Neurological:     Mental Status: He is alert.     Diagnostics: none  Assessment and Plan:   1. Hymenoptera reaction, accidental or unintentional, subsequent encounter     1. Continue immunotherapy and EpiPen  2. Return to clinic in 1 year or earlier if problem   David Fitzgerald is doing very well and he will continue on immunotherapy and I will see him back in this clinic in 1 year or earlier if there is a problem.  [name], MD Allergy / Immunology [place]

## 2018-07-27 ENCOUNTER — Encounter: Payer: Self-pay | Admitting: Allergy and Immunology

## 2018-08-10 DIAGNOSIS — G4733 Obstructive sleep apnea (adult) (pediatric): Secondary | ICD-10-CM | POA: Diagnosis not present

## 2018-08-13 DIAGNOSIS — Z79891 Long term (current) use of opiate analgesic: Secondary | ICD-10-CM | POA: Diagnosis not present

## 2018-08-13 DIAGNOSIS — M1612 Unilateral primary osteoarthritis, left hip: Secondary | ICD-10-CM | POA: Diagnosis not present

## 2018-08-13 DIAGNOSIS — G4733 Obstructive sleep apnea (adult) (pediatric): Secondary | ICD-10-CM | POA: Diagnosis not present

## 2018-08-13 DIAGNOSIS — G894 Chronic pain syndrome: Secondary | ICD-10-CM | POA: Diagnosis not present

## 2018-08-13 DIAGNOSIS — Z1389 Encounter for screening for other disorder: Secondary | ICD-10-CM | POA: Diagnosis not present

## 2018-08-13 DIAGNOSIS — I4891 Unspecified atrial fibrillation: Secondary | ICD-10-CM | POA: Diagnosis not present

## 2018-08-13 DIAGNOSIS — M47818 Spondylosis without myelopathy or radiculopathy, sacral and sacrococcygeal region: Secondary | ICD-10-CM | POA: Diagnosis not present

## 2018-08-13 DIAGNOSIS — E1140 Type 2 diabetes mellitus with diabetic neuropathy, unspecified: Secondary | ICD-10-CM | POA: Diagnosis not present

## 2018-08-13 DIAGNOSIS — M5416 Radiculopathy, lumbar region: Secondary | ICD-10-CM | POA: Diagnosis not present

## 2018-08-13 DIAGNOSIS — M961 Postlaminectomy syndrome, not elsewhere classified: Secondary | ICD-10-CM | POA: Diagnosis not present

## 2018-08-27 DIAGNOSIS — Z6841 Body Mass Index (BMI) 40.0 and over, adult: Secondary | ICD-10-CM | POA: Diagnosis not present

## 2018-08-27 DIAGNOSIS — Z0000 Encounter for general adult medical examination without abnormal findings: Secondary | ICD-10-CM | POA: Diagnosis not present

## 2018-09-10 ENCOUNTER — Ambulatory Visit: Payer: Self-pay

## 2018-09-10 DIAGNOSIS — G4733 Obstructive sleep apnea (adult) (pediatric): Secondary | ICD-10-CM | POA: Diagnosis not present

## 2018-09-23 DIAGNOSIS — M5416 Radiculopathy, lumbar region: Secondary | ICD-10-CM | POA: Diagnosis not present

## 2018-09-23 DIAGNOSIS — Z1389 Encounter for screening for other disorder: Secondary | ICD-10-CM | POA: Diagnosis not present

## 2018-09-23 DIAGNOSIS — G894 Chronic pain syndrome: Secondary | ICD-10-CM | POA: Diagnosis not present

## 2018-09-23 DIAGNOSIS — M961 Postlaminectomy syndrome, not elsewhere classified: Secondary | ICD-10-CM | POA: Diagnosis not present

## 2018-10-01 ENCOUNTER — Telehealth: Payer: Medicare HMO | Admitting: Cardiology

## 2018-10-08 ENCOUNTER — Ambulatory Visit (INDEPENDENT_AMBULATORY_CARE_PROVIDER_SITE_OTHER): Payer: Medicare HMO | Admitting: *Deleted

## 2018-10-08 DIAGNOSIS — T63441D Toxic effect of venom of bees, accidental (unintentional), subsequent encounter: Secondary | ICD-10-CM | POA: Diagnosis not present

## 2018-10-08 DIAGNOSIS — IMO0001 Reserved for inherently not codable concepts without codable children: Secondary | ICD-10-CM

## 2018-10-11 DIAGNOSIS — G4733 Obstructive sleep apnea (adult) (pediatric): Secondary | ICD-10-CM | POA: Diagnosis not present

## 2018-10-21 DIAGNOSIS — G894 Chronic pain syndrome: Secondary | ICD-10-CM | POA: Diagnosis not present

## 2018-10-21 DIAGNOSIS — M5416 Radiculopathy, lumbar region: Secondary | ICD-10-CM | POA: Diagnosis not present

## 2018-10-21 DIAGNOSIS — M961 Postlaminectomy syndrome, not elsewhere classified: Secondary | ICD-10-CM | POA: Diagnosis not present

## 2018-10-21 DIAGNOSIS — Z1389 Encounter for screening for other disorder: Secondary | ICD-10-CM | POA: Diagnosis not present

## 2018-10-22 DIAGNOSIS — H16142 Punctate keratitis, left eye: Secondary | ICD-10-CM | POA: Diagnosis not present

## 2018-10-27 DIAGNOSIS — H16142 Punctate keratitis, left eye: Secondary | ICD-10-CM | POA: Diagnosis not present

## 2018-11-10 DIAGNOSIS — G4733 Obstructive sleep apnea (adult) (pediatric): Secondary | ICD-10-CM | POA: Diagnosis not present

## 2018-11-16 DIAGNOSIS — Z79891 Long term (current) use of opiate analgesic: Secondary | ICD-10-CM | POA: Diagnosis not present

## 2018-11-16 DIAGNOSIS — G894 Chronic pain syndrome: Secondary | ICD-10-CM | POA: Diagnosis not present

## 2018-11-16 DIAGNOSIS — Z1389 Encounter for screening for other disorder: Secondary | ICD-10-CM | POA: Diagnosis not present

## 2018-11-16 DIAGNOSIS — M47818 Spondylosis without myelopathy or radiculopathy, sacral and sacrococcygeal region: Secondary | ICD-10-CM | POA: Diagnosis not present

## 2018-11-16 DIAGNOSIS — Z981 Arthrodesis status: Secondary | ICD-10-CM | POA: Diagnosis not present

## 2018-11-16 DIAGNOSIS — M5416 Radiculopathy, lumbar region: Secondary | ICD-10-CM | POA: Diagnosis not present

## 2018-11-29 ENCOUNTER — Other Ambulatory Visit: Payer: Self-pay | Admitting: Cardiology

## 2018-12-01 ENCOUNTER — Ambulatory Visit: Payer: Medicare HMO | Admitting: Cardiology

## 2018-12-03 ENCOUNTER — Ambulatory Visit (INDEPENDENT_AMBULATORY_CARE_PROVIDER_SITE_OTHER): Payer: Medicare HMO | Admitting: *Deleted

## 2018-12-03 ENCOUNTER — Other Ambulatory Visit: Payer: Self-pay

## 2018-12-03 ENCOUNTER — Ambulatory Visit (INDEPENDENT_AMBULATORY_CARE_PROVIDER_SITE_OTHER): Payer: Medicare HMO | Admitting: Cardiology

## 2018-12-03 VITALS — BP 130/72 | HR 94 | Ht 74.0 in | Wt 379.0 lb

## 2018-12-03 DIAGNOSIS — I952 Hypotension due to drugs: Secondary | ICD-10-CM | POA: Diagnosis not present

## 2018-12-03 DIAGNOSIS — I119 Hypertensive heart disease without heart failure: Secondary | ICD-10-CM

## 2018-12-03 DIAGNOSIS — G4733 Obstructive sleep apnea (adult) (pediatric): Secondary | ICD-10-CM

## 2018-12-03 DIAGNOSIS — T63441D Toxic effect of venom of bees, accidental (unintentional), subsequent encounter: Secondary | ICD-10-CM

## 2018-12-03 DIAGNOSIS — Z7901 Long term (current) use of anticoagulants: Secondary | ICD-10-CM

## 2018-12-03 DIAGNOSIS — I4819 Other persistent atrial fibrillation: Secondary | ICD-10-CM

## 2018-12-03 DIAGNOSIS — E785 Hyperlipidemia, unspecified: Secondary | ICD-10-CM | POA: Diagnosis not present

## 2018-12-03 DIAGNOSIS — IMO0001 Reserved for inherently not codable concepts without codable children: Secondary | ICD-10-CM

## 2018-12-03 NOTE — Progress Notes (Signed)
Cardiology Office Note:    Date:  12/03/2018   ID:  David Fitzgerald, DOB [birth year], MRN [id number]  PCP:  Angelina Sheriff, MD  Cardiologist:  Shirlee More, MD    Referring MD: Angelina Sheriff, MD    ASSESSMENT:    1. Persistent atrial fibrillation ([place])   2. Chronic anticoagulation   3. Hypertensive heart disease without heart failure   4. Hypotension due to drugs   5. OSA treated with BiPAP   6. Hyperlipidemia, unspecified hyperlipidemia type    PLAN:    In order of problems listed above:  1. Stable asymptomatic rate controlled continue beta-blocker and anticoagulant 2. Continue his current anticoagulant he said no bleeding complication 3. Stable, BP at target continue treatment including diuretic beta-blocker 4. He has had no recurrent hypotension since withdrawal of agents in [place] 5. Currently not treated 6. Check CMP lipid profile continue statin   Next appointment: 6 months   Medication Adjustments/Labs and Tests Ordered: Current medicines are reviewed at length with the patient today.  Concerns regarding medicines are outlined above.  No orders of the defined types were placed in this encounter.  No orders of the defined types were placed in this encounter.   Chief Complaint  Patient presents with  . Follow-up    History of Present Illness:    David Fitzgerald is a 58 y.o. male with a hx of  atrial fibrillation CHADS2vasc of 2 hypertension type 2 diabetes and hyperlipidemia as well as obstructive sleep apnea and obesity hypoventilation syndrome seen 12/26/2017 after brief hospitalization in [place] with hypotension and withdrawal of several antihypertensive medications.  He was last seen 05/19/2018. Compliance with diet, lifestyle and medications: Yes Past Medical History:  Diagnosis Date  . Allergy to pollen 04/06/2015  . Anxiety 02/06/2017  . Arthritis 02/06/2017  . Bee sting allergy    wasp / mixed vespid  . Bell's palsy 02/24/2013  .  Chronic venous insufficiency 12/14/2014  . Diabetes ([place])   . Edema 12/14/2014  . Hyperlipidemia, mixed 12/14/2014  . Hypertension   . Lumbar pain with radiation down left leg 07/09/2012  . Morbid obesity ([place]) 12/14/2014  . Morbid obesity with BMI of 45.0-49.9, adult ([place]) 07/09/2012  . Obesity hypoventilation syndrome ([place]) 12/14/2014  . OSA treated with BiPAP 12/14/2014  . Pain syndrome, chronic 07/09/2012  . Pernicious anemia 02/06/2017  . Polyneuropathy in diabetes ([place]) 02/24/2013  . Syndrome affecting cervical region 02/24/2013  . Trigeminal neuralgia 02/24/2013  . Type 2 diabetes mellitus with diabetic neuropathy ([place]) 12/14/2014    Past Surgical History:  Procedure Laterality Date  . ADENOIDECTOMY    . BACK SURGERY  2002 /2003  . CARPAL TUNNEL RELEASE Bilateral   . KNEE SURGERY Left 2005  . SHOULDER SURGERY Left 1998  . TONSILLECTOMY      Current Medications: Current Meds  Medication Sig  . dicyclomine (BENTYL) 20 MG tablet Take 1 tablet by mouth 2 (two) times daily.  . DULoxetine (CYMBALTA) 60 MG capsule Take 60 mg by mouth daily.   Marland Kitchen ELIQUIS 5 MG TABS tablet TAKE 1 TABLET BY MOUTH TWICE DAILY  . EPINEPHrine 0.3 mg/0.3 mL IJ SOAJ injection Use for life-threatening allergic reactions  . furosemide (LASIX) 40 MG tablet Take 40 mg by mouth daily as needed.   . gabapentin (NEURONTIN) 300 MG capsule Take 600 mg by mouth 3 (three) times daily.   . metFORMIN (GLUCOPHAGE) 500 MG tablet Take 500 mg by mouth  daily with breakfast.  . metoprolol tartrate (LOPRESSOR) 25 MG tablet Take 1 tablet (25 mg total) by mouth 3 (three) times daily.  Marland Kitchen MIXED VESPID VENOM PROTEIN [place] Inject into the skin.  Marland Kitchen nitroGLYCERIN (NITROSTAT) 0.4 MG SL tablet Place 0.4 mg under the tongue every 5 (five) minutes as needed. Reported on 04/26/2015  . ondansetron (ZOFRAN-ODT) 4 MG disintegrating tablet Take 4 mg by mouth every 8 (eight) hours as needed for nausea or vomiting.  Marland Kitchen oxyCODONE-acetaminophen (PERCOCET) 7.5-325  MG tablet Take 1 tablet by mouth every 8 (eight) hours as needed for severe pain.  . simvastatin (ZOCOR) 20 MG tablet Take 20 mg by mouth daily.  . Turmeric Curcumin 500 MG CAPS Take 1 capsule by mouth daily.  . VENOMIL WASP VENOM IJ Inject as directed.     Allergies:   Penicillins, Aspirin, and Bee venom   Social History   Socioeconomic History  . Marital status: Married    Spouse name: Not on file  . Number of children: Not on file  . Years of education: Not on file  . Highest education level: Not on file  Occupational History  . Not on file  Social Needs  . Financial resource strain: Not on file  . Food insecurity    Worry: Not on file    Inability: Not on file  . Transportation needs    Medical: Not on file    Non-medical: Not on file  Tobacco Use  . Smoking status: Former Smoker    Packs/day: 3.00    Years: 35.00    Pack years: 105.00    Types: Cigarettes    Quit date: 04/13/2006    Years since quitting: 12.6  . Smokeless tobacco: Never Used  Substance and Sexual Activity  . Alcohol use: No    Alcohol/week: 0.0 standard drinks  . Drug use: No  . Sexual activity: Not on file  Lifestyle  . Physical activity    Days per week: Not on file    Minutes per session: Not on file  . Stress: Not on file  Relationships  . Social Herbalist on phone: Not on file    Gets together: Not on file    Attends religious service: Not on file    Active member of club or organization: Not on file    Attends meetings of clubs or organizations: Not on file    Relationship status: Not on file  Other Topics Concern  . Not on file  Social History Narrative  . Not on file     Family History: The patient's family history includes Atrial fibrillation in his father; Breast cancer in his maternal grandmother and mother; CAD in his father; Heart disease in his father and paternal uncle; Stomach cancer in his paternal aunt. ROS:   Please see the history of present illness.     All other systems reviewed and are negative.  EKGs/Labs/Other Studies Reviewed:    The following studies were reviewed today  Recent Labs: No results found for requested labs within last 8760 hours.  Recent Lipid Panel No results found for: CHOL, TRIG, HDL, CHOLHDL, VLDL, LDLCALC, LDLDIRECT  Physical Exam:    VS:  BP 130/72   Pulse 94   Ht 6\' 2"  (1.88 m)   Wt (!) 379 lb (171.9 kg)   SpO2 96%   BMI 48.66 kg/m     Wt Readings from Last 3 Encounters:  12/03/18 (!) 379 lb (171.9 kg)  12/26/17 Marland Kitchen)  379 lb 6.4 oz (172.1 kg)  03/07/17 (!) 378 lb 1.9 oz (171.5 kg)     GEN: [place] obesity BMI approaching 50 well nourished, well developed in no acute distress HEENT: Normal NECK: No JVD; No carotid bruits LYMPHATICS: No lymphadenopathy CARDIAC: Irregular variable first heart sound  no murmurs, rubs, gallops RESPIRATORY:  Clear to auscultation without rales, wheezing or rhonchi  ABDOMEN: Soft, non-tender, non-distended MUSCULOSKELETAL:  No edema; No deformity  SKIN: Warm and dry NEUROLOGIC:  Alert and oriented x 3 PSYCHIATRIC:  Normal affect    Signed, Shirlee More, MD  12/03/2018 2:38 PM    [place]

## 2018-12-03 NOTE — Addendum Note (Signed)
Addended by: Austin Miles on: 12/03/2018 02:52 PM   Modules accepted: Orders

## 2018-12-03 NOTE — Patient Instructions (Signed)
Medication Instructions:  Your physician recommends that you continue on your current medications as directed. Please refer to the Current Medication list given to you today.  *If you need a refill on your cardiac medications before your next appointment, please call your pharmacy*  Lab Work: Your physician recommends that you return for lab work today: CMP, lipid panel.   If you have labs (blood work) drawn today and your tests are completely normal, you will receive your results only by: Marland Kitchen MyChart Message (if you have MyChart) OR . A paper copy in the mail If you have any lab test that is abnormal or we need to change your treatment, we will call you to review the results.  Testing/Procedures: None  Follow-Up: At [facility], you and your health needs are our priority.  As part of our continuing mission to provide you with exceptional heart care, we have created designated Provider Care Teams.  These Care Teams include your primary Cardiologist (physician) and Advanced Practice Providers (APPs -  Physician Assistants and Nurse Practitioners) who all work together to provide you with the care you need, when you need it.  Your next appointment:   6 months  The format for your next appointment:   In Person  Provider:   Shirlee More, MD

## 2018-12-04 LAB — COMPREHENSIVE METABOLIC PANEL
ALT: 17 IU/L (ref 0–44)
AST: 25 IU/L (ref 0–40)
Albumin/Globulin Ratio: 1.2 (ref 1.2–2.2)
Albumin: 4.3 g/dL (ref 3.8–4.9)
Alkaline Phosphatase: 84 IU/L (ref 39–117)
BUN/Creatinine Ratio: 10 (ref 9–20)
BUN: 12 mg/dL (ref 6–24)
Bilirubin Total: 0.6 mg/dL (ref 0.0–1.2)
CO2: 25 mmol/L (ref 20–29)
Calcium: 9.3 mg/dL (ref 8.7–10.2)
Chloride: 98 mmol/L (ref 96–106)
Creatinine, Ser: 1.16 mg/dL (ref 0.76–1.27)
GFR calc Af Amer: 80 mL/min/{1.73_m2} (ref >59)
GFR calc non Af Amer: 69 mL/min/{1.73_m2} (ref >59)
Globulin, Total: 3.7 g/dL (ref 1.5–4.5)
Glucose: 112 mg/dL — ABNORMAL HIGH (ref 65–99)
Potassium: 4.4 mmol/L (ref 3.5–5.2)
Sodium: 139 mmol/L (ref 134–144)
Total Protein: 8 g/dL (ref 6.0–8.5)

## 2018-12-04 LAB — LIPID PANEL
Chol/HDL Ratio: 5.4 ratio — ABNORMAL HIGH (ref 0.0–5.0)
Cholesterol, Total: 156 mg/dL (ref 100–199)
HDL: 29 mg/dL — ABNORMAL LOW (ref >39)
LDL Chol Calc (NIH): 78 mg/dL (ref 0–99)
Triglycerides: 301 mg/dL — ABNORMAL HIGH (ref 0–149)
VLDL Cholesterol Cal: 49 mg/dL — ABNORMAL HIGH (ref 5–40)

## 2018-12-11 DIAGNOSIS — G4733 Obstructive sleep apnea (adult) (pediatric): Secondary | ICD-10-CM | POA: Diagnosis not present

## 2018-12-21 DIAGNOSIS — M5416 Radiculopathy, lumbar region: Secondary | ICD-10-CM | POA: Diagnosis not present

## 2018-12-21 DIAGNOSIS — M47818 Spondylosis without myelopathy or radiculopathy, sacral and sacrococcygeal region: Secondary | ICD-10-CM | POA: Diagnosis not present

## 2018-12-21 DIAGNOSIS — Z1389 Encounter for screening for other disorder: Secondary | ICD-10-CM | POA: Diagnosis not present

## 2018-12-21 DIAGNOSIS — M1612 Unilateral primary osteoarthritis, left hip: Secondary | ICD-10-CM | POA: Diagnosis not present

## 2018-12-21 DIAGNOSIS — Z79891 Long term (current) use of opiate analgesic: Secondary | ICD-10-CM | POA: Diagnosis not present

## 2018-12-21 DIAGNOSIS — Z981 Arthrodesis status: Secondary | ICD-10-CM | POA: Diagnosis not present

## 2018-12-21 DIAGNOSIS — G894 Chronic pain syndrome: Secondary | ICD-10-CM | POA: Diagnosis not present

## 2019-01-10 DIAGNOSIS — G4733 Obstructive sleep apnea (adult) (pediatric): Secondary | ICD-10-CM | POA: Diagnosis not present

## 2019-01-14 DIAGNOSIS — Z981 Arthrodesis status: Secondary | ICD-10-CM | POA: Diagnosis not present

## 2019-01-14 DIAGNOSIS — G894 Chronic pain syndrome: Secondary | ICD-10-CM | POA: Diagnosis not present

## 2019-01-14 DIAGNOSIS — M1612 Unilateral primary osteoarthritis, left hip: Secondary | ICD-10-CM | POA: Diagnosis not present

## 2019-01-14 DIAGNOSIS — Z79891 Long term (current) use of opiate analgesic: Secondary | ICD-10-CM | POA: Diagnosis not present

## 2019-01-14 DIAGNOSIS — M5136 Other intervertebral disc degeneration, lumbar region: Secondary | ICD-10-CM | POA: Diagnosis not present

## 2019-01-14 DIAGNOSIS — Z1389 Encounter for screening for other disorder: Secondary | ICD-10-CM | POA: Diagnosis not present

## 2019-01-14 DIAGNOSIS — M47818 Spondylosis without myelopathy or radiculopathy, sacral and sacrococcygeal region: Secondary | ICD-10-CM | POA: Diagnosis not present

## 2019-01-14 DIAGNOSIS — M5416 Radiculopathy, lumbar region: Secondary | ICD-10-CM | POA: Diagnosis not present

## 2019-01-24 ENCOUNTER — Other Ambulatory Visit: Payer: Self-pay | Admitting: Cardiology

## 2019-01-27 ENCOUNTER — Ambulatory Visit (INDEPENDENT_AMBULATORY_CARE_PROVIDER_SITE_OTHER): Payer: Medicare HMO | Admitting: *Deleted

## 2019-01-27 ENCOUNTER — Other Ambulatory Visit: Payer: Self-pay

## 2019-01-27 DIAGNOSIS — T63441D Toxic effect of venom of bees, accidental (unintentional), subsequent encounter: Secondary | ICD-10-CM | POA: Diagnosis not present

## 2019-01-27 DIAGNOSIS — IMO0001 Reserved for inherently not codable concepts without codable children: Secondary | ICD-10-CM

## 2019-02-10 DIAGNOSIS — G4733 Obstructive sleep apnea (adult) (pediatric): Secondary | ICD-10-CM | POA: Diagnosis not present

## 2019-02-11 DIAGNOSIS — M1612 Unilateral primary osteoarthritis, left hip: Secondary | ICD-10-CM | POA: Diagnosis not present

## 2019-02-11 DIAGNOSIS — Z981 Arthrodesis status: Secondary | ICD-10-CM | POA: Diagnosis not present

## 2019-02-11 DIAGNOSIS — M5416 Radiculopathy, lumbar region: Secondary | ICD-10-CM | POA: Diagnosis not present

## 2019-02-11 DIAGNOSIS — G894 Chronic pain syndrome: Secondary | ICD-10-CM | POA: Diagnosis not present

## 2019-02-11 DIAGNOSIS — M47818 Spondylosis without myelopathy or radiculopathy, sacral and sacrococcygeal region: Secondary | ICD-10-CM | POA: Diagnosis not present

## 2019-02-11 DIAGNOSIS — Z79891 Long term (current) use of opiate analgesic: Secondary | ICD-10-CM | POA: Diagnosis not present

## 2019-02-11 DIAGNOSIS — M5136 Other intervertebral disc degeneration, lumbar region: Secondary | ICD-10-CM | POA: Diagnosis not present

## 2019-02-22 DIAGNOSIS — E7800 Pure hypercholesterolemia, unspecified: Secondary | ICD-10-CM | POA: Diagnosis not present

## 2019-02-22 DIAGNOSIS — Z1331 Encounter for screening for depression: Secondary | ICD-10-CM | POA: Diagnosis not present

## 2019-02-22 DIAGNOSIS — E7439 Other disorders of intestinal carbohydrate absorption: Secondary | ICD-10-CM | POA: Diagnosis not present

## 2019-02-22 DIAGNOSIS — Z6841 Body Mass Index (BMI) 40.0 and over, adult: Secondary | ICD-10-CM | POA: Diagnosis not present

## 2019-02-22 DIAGNOSIS — I10 Essential (primary) hypertension: Secondary | ICD-10-CM | POA: Diagnosis not present

## 2019-03-11 DIAGNOSIS — G894 Chronic pain syndrome: Secondary | ICD-10-CM | POA: Diagnosis not present

## 2019-03-11 DIAGNOSIS — M47818 Spondylosis without myelopathy or radiculopathy, sacral and sacrococcygeal region: Secondary | ICD-10-CM | POA: Diagnosis not present

## 2019-03-11 DIAGNOSIS — M5416 Radiculopathy, lumbar region: Secondary | ICD-10-CM | POA: Diagnosis not present

## 2019-03-11 DIAGNOSIS — Z79891 Long term (current) use of opiate analgesic: Secondary | ICD-10-CM | POA: Diagnosis not present

## 2019-03-11 DIAGNOSIS — M1612 Unilateral primary osteoarthritis, left hip: Secondary | ICD-10-CM | POA: Diagnosis not present

## 2019-03-11 DIAGNOSIS — M5136 Other intervertebral disc degeneration, lumbar region: Secondary | ICD-10-CM | POA: Diagnosis not present

## 2019-03-11 DIAGNOSIS — Z981 Arthrodesis status: Secondary | ICD-10-CM | POA: Diagnosis not present

## 2019-03-13 DIAGNOSIS — G4733 Obstructive sleep apnea (adult) (pediatric): Secondary | ICD-10-CM | POA: Diagnosis not present

## 2019-03-24 ENCOUNTER — Ambulatory Visit: Payer: Self-pay

## 2019-03-31 ENCOUNTER — Ambulatory Visit (INDEPENDENT_AMBULATORY_CARE_PROVIDER_SITE_OTHER): Payer: Medicare HMO | Admitting: *Deleted

## 2019-03-31 DIAGNOSIS — T63441D Toxic effect of venom of bees, accidental (unintentional), subsequent encounter: Secondary | ICD-10-CM

## 2019-03-31 DIAGNOSIS — IMO0001 Reserved for inherently not codable concepts without codable children: Secondary | ICD-10-CM

## 2019-04-08 DIAGNOSIS — G894 Chronic pain syndrome: Secondary | ICD-10-CM | POA: Diagnosis not present

## 2019-04-08 DIAGNOSIS — M47816 Spondylosis without myelopathy or radiculopathy, lumbar region: Secondary | ICD-10-CM | POA: Diagnosis not present

## 2019-04-08 DIAGNOSIS — Z79891 Long term (current) use of opiate analgesic: Secondary | ICD-10-CM | POA: Diagnosis not present

## 2019-04-08 DIAGNOSIS — M5136 Other intervertebral disc degeneration, lumbar region: Secondary | ICD-10-CM | POA: Diagnosis not present

## 2019-04-08 DIAGNOSIS — M47818 Spondylosis without myelopathy or radiculopathy, sacral and sacrococcygeal region: Secondary | ICD-10-CM | POA: Diagnosis not present

## 2019-04-08 DIAGNOSIS — M1612 Unilateral primary osteoarthritis, left hip: Secondary | ICD-10-CM | POA: Diagnosis not present

## 2019-04-08 DIAGNOSIS — Z981 Arthrodesis status: Secondary | ICD-10-CM | POA: Diagnosis not present

## 2019-04-10 DIAGNOSIS — G4733 Obstructive sleep apnea (adult) (pediatric): Secondary | ICD-10-CM | POA: Diagnosis not present

## 2019-04-21 DIAGNOSIS — Z6841 Body Mass Index (BMI) 40.0 and over, adult: Secondary | ICD-10-CM | POA: Diagnosis not present

## 2019-04-21 DIAGNOSIS — R310 Gross hematuria: Secondary | ICD-10-CM | POA: Diagnosis not present

## 2019-04-21 DIAGNOSIS — I10 Essential (primary) hypertension: Secondary | ICD-10-CM | POA: Diagnosis not present

## 2019-04-22 DIAGNOSIS — R319 Hematuria, unspecified: Secondary | ICD-10-CM | POA: Diagnosis not present

## 2019-04-22 DIAGNOSIS — N401 Enlarged prostate with lower urinary tract symptoms: Secondary | ICD-10-CM | POA: Diagnosis not present

## 2019-04-22 DIAGNOSIS — N390 Urinary tract infection, site not specified: Secondary | ICD-10-CM | POA: Diagnosis not present

## 2019-04-22 DIAGNOSIS — R310 Gross hematuria: Secondary | ICD-10-CM | POA: Diagnosis not present

## 2019-04-22 DIAGNOSIS — Z79899 Other long term (current) drug therapy: Secondary | ICD-10-CM | POA: Diagnosis not present

## 2019-04-23 DIAGNOSIS — N390 Urinary tract infection, site not specified: Secondary | ICD-10-CM | POA: Diagnosis not present

## 2019-04-27 ENCOUNTER — Telehealth: Payer: Self-pay

## 2019-04-27 DIAGNOSIS — N2889 Other specified disorders of kidney and ureter: Secondary | ICD-10-CM | POA: Diagnosis not present

## 2019-04-27 DIAGNOSIS — R310 Gross hematuria: Secondary | ICD-10-CM | POA: Diagnosis not present

## 2019-04-27 DIAGNOSIS — N401 Enlarged prostate with lower urinary tract symptoms: Secondary | ICD-10-CM | POA: Diagnosis not present

## 2019-04-27 NOTE — Telephone Encounter (Signed)
Pharmacy, please comment on how long patient can hold Eliquis for upcoming procedure?  Thank you! 

## 2019-04-27 NOTE — Telephone Encounter (Signed)
   [facility] Pre-operative Risk Assessment    Request for surgical clearance:  1. What type of surgery is being performed? A procedure for Gross Hematuria   2. When is this surgery scheduled? 05/20/19   3. What type of clearance is required (medical clearance vs. Pharmacy clearance to hold med vs. Both)? Medical/Pharmacy clearance  4. Are there any medications that need to be held prior to surgery and how long? Anticoagulants   5. Practice name and name of physician performing surgery? [facility]   6. What is your office phone number [phone]    7.   What is your office fax number [phone]  8.   Anesthesia type (None, local, MAC, general) ? General Anesthesia.   Gita Kudo 04/27/2019, 2:48 PM  _________________________________________________________________   (provider comments below)

## 2019-04-27 NOTE — Telephone Encounter (Signed)
   Primary Cardiologist: Shirlee More, MD  Chart reviewed as part of pre-operative protocol coverage. Patient was last seen by Dr. Bettina Gavia in 11/2018 at which time he was doing well from a cardiac standpoint. Patient contacted today for further pre-op evaluation and reports doing well since last visit. No chest pain, shortness of breath, orthopnea, PND, or edema. He notes very seldom palpitations but no lightheadedness, dizziness, or syncope. Able to complete >4.0 METS.   Given past medical history and time since last visit, based on ACC/AHA guidelines, David Fitzgerald would be at acceptable risk for the planned procedure without further cardiovascular testing.   Per Pharmacy and office protocol, "patient can hold Eliquis for 2 days prior to procedure." This should be resumed as soon as possible following procedure.   I will route this recommendation to the requesting party via Epic fax function and remove from pre-op pool.  Please call with questions.  Darreld Mclean, PA-C 04/27/2019, 4:51 PM

## 2019-04-27 NOTE — Telephone Encounter (Signed)
Patient with diagnosis of afib on Eliquis for anticoagulation.    Procedure: A procedure for Gross Hematuria  Date of procedure: 05/20/2019  CHADS2-VASc score of  3 (HTN, DM2, CAD)  CrCl 115 ml/min  Per office protocol, patient can hold Eliquis for 2 days prior to procedure.

## 2019-05-03 DIAGNOSIS — N2889 Other specified disorders of kidney and ureter: Secondary | ICD-10-CM | POA: Diagnosis not present

## 2019-05-04 DIAGNOSIS — N390 Urinary tract infection, site not specified: Secondary | ICD-10-CM | POA: Diagnosis not present

## 2019-05-07 ENCOUNTER — Other Ambulatory Visit: Payer: Self-pay | Admitting: Urology

## 2019-05-07 DIAGNOSIS — N2889 Other specified disorders of kidney and ureter: Secondary | ICD-10-CM

## 2019-05-10 DIAGNOSIS — Z981 Arthrodesis status: Secondary | ICD-10-CM | POA: Diagnosis not present

## 2019-05-10 DIAGNOSIS — M47816 Spondylosis without myelopathy or radiculopathy, lumbar region: Secondary | ICD-10-CM | POA: Diagnosis not present

## 2019-05-10 DIAGNOSIS — M5136 Other intervertebral disc degeneration, lumbar region: Secondary | ICD-10-CM | POA: Diagnosis not present

## 2019-05-10 DIAGNOSIS — M47818 Spondylosis without myelopathy or radiculopathy, sacral and sacrococcygeal region: Secondary | ICD-10-CM | POA: Diagnosis not present

## 2019-05-10 DIAGNOSIS — M1612 Unilateral primary osteoarthritis, left hip: Secondary | ICD-10-CM | POA: Diagnosis not present

## 2019-05-10 DIAGNOSIS — G894 Chronic pain syndrome: Secondary | ICD-10-CM | POA: Diagnosis not present

## 2019-05-10 DIAGNOSIS — Z79891 Long term (current) use of opiate analgesic: Secondary | ICD-10-CM | POA: Diagnosis not present

## 2019-05-11 DIAGNOSIS — G4733 Obstructive sleep apnea (adult) (pediatric): Secondary | ICD-10-CM | POA: Diagnosis not present

## 2019-05-14 ENCOUNTER — Ambulatory Visit
Admission: RE | Admit: 2019-05-14 | Discharge: 2019-05-14 | Disposition: A | Discharge status: Home / Self Care | Payer: Medicare HMO | Source: Ambulatory Visit | Attending: Urology | Admitting: Urology

## 2019-05-14 DIAGNOSIS — Z1159 Encounter for screening for other viral diseases: Secondary | ICD-10-CM | POA: Diagnosis not present

## 2019-05-14 DIAGNOSIS — N2889 Other specified disorders of kidney and ureter: Secondary | ICD-10-CM

## 2019-05-14 DIAGNOSIS — Z1152 Encounter for screening for COVID-19: Secondary | ICD-10-CM | POA: Diagnosis not present

## 2019-05-20 DIAGNOSIS — I480 Paroxysmal atrial fibrillation: Secondary | ICD-10-CM | POA: Diagnosis not present

## 2019-05-20 DIAGNOSIS — I10 Essential (primary) hypertension: Secondary | ICD-10-CM | POA: Diagnosis not present

## 2019-05-20 DIAGNOSIS — Z87442 Personal history of urinary calculi: Secondary | ICD-10-CM | POA: Diagnosis not present

## 2019-05-20 DIAGNOSIS — K219 Gastro-esophageal reflux disease without esophagitis: Secondary | ICD-10-CM | POA: Diagnosis not present

## 2019-05-20 DIAGNOSIS — R319 Hematuria, unspecified: Secondary | ICD-10-CM | POA: Diagnosis not present

## 2019-05-20 DIAGNOSIS — N2889 Other specified disorders of kidney and ureter: Secondary | ICD-10-CM | POA: Diagnosis not present

## 2019-05-20 DIAGNOSIS — M1990 Unspecified osteoarthritis, unspecified site: Secondary | ICD-10-CM | POA: Diagnosis not present

## 2019-05-20 DIAGNOSIS — R310 Gross hematuria: Secondary | ICD-10-CM | POA: Diagnosis not present

## 2019-05-20 DIAGNOSIS — I2510 Atherosclerotic heart disease of native coronary artery without angina pectoris: Secondary | ICD-10-CM | POA: Diagnosis not present

## 2019-05-20 DIAGNOSIS — Z951 Presence of aortocoronary bypass graft: Secondary | ICD-10-CM | POA: Diagnosis not present

## 2019-05-20 DIAGNOSIS — G8929 Other chronic pain: Secondary | ICD-10-CM | POA: Diagnosis not present

## 2019-05-26 ENCOUNTER — Other Ambulatory Visit: Payer: Self-pay

## 2019-05-26 ENCOUNTER — Ambulatory Visit (INDEPENDENT_AMBULATORY_CARE_PROVIDER_SITE_OTHER): Payer: Medicare HMO | Admitting: *Deleted

## 2019-05-26 DIAGNOSIS — T63441D Toxic effect of venom of bees, accidental (unintentional), subsequent encounter: Secondary | ICD-10-CM | POA: Diagnosis not present

## 2019-05-26 DIAGNOSIS — IMO0001 Reserved for inherently not codable concepts without codable children: Secondary | ICD-10-CM

## 2019-06-03 DIAGNOSIS — M1612 Unilateral primary osteoarthritis, left hip: Secondary | ICD-10-CM | POA: Diagnosis not present

## 2019-06-03 DIAGNOSIS — M25552 Pain in left hip: Secondary | ICD-10-CM | POA: Diagnosis not present

## 2019-06-03 DIAGNOSIS — M47816 Spondylosis without myelopathy or radiculopathy, lumbar region: Secondary | ICD-10-CM | POA: Diagnosis not present

## 2019-06-03 DIAGNOSIS — M47818 Spondylosis without myelopathy or radiculopathy, sacral and sacrococcygeal region: Secondary | ICD-10-CM | POA: Diagnosis not present

## 2019-06-03 DIAGNOSIS — G894 Chronic pain syndrome: Secondary | ICD-10-CM | POA: Diagnosis not present

## 2019-06-03 DIAGNOSIS — M5136 Other intervertebral disc degeneration, lumbar region: Secondary | ICD-10-CM | POA: Diagnosis not present

## 2019-06-03 DIAGNOSIS — Z79891 Long term (current) use of opiate analgesic: Secondary | ICD-10-CM | POA: Diagnosis not present

## 2019-06-03 DIAGNOSIS — M545 Low back pain: Secondary | ICD-10-CM | POA: Diagnosis not present

## 2019-06-10 DIAGNOSIS — N281 Cyst of kidney, acquired: Secondary | ICD-10-CM | POA: Diagnosis not present

## 2019-06-10 DIAGNOSIS — G4733 Obstructive sleep apnea (adult) (pediatric): Secondary | ICD-10-CM | POA: Diagnosis not present

## 2019-06-18 DIAGNOSIS — N2889 Other specified disorders of kidney and ureter: Secondary | ICD-10-CM | POA: Diagnosis not present

## 2019-06-18 DIAGNOSIS — N401 Enlarged prostate with lower urinary tract symptoms: Secondary | ICD-10-CM | POA: Diagnosis not present

## 2019-06-18 DIAGNOSIS — R310 Gross hematuria: Secondary | ICD-10-CM | POA: Diagnosis not present

## 2019-07-01 DIAGNOSIS — M47816 Spondylosis without myelopathy or radiculopathy, lumbar region: Secondary | ICD-10-CM | POA: Diagnosis not present

## 2019-07-01 DIAGNOSIS — M1612 Unilateral primary osteoarthritis, left hip: Secondary | ICD-10-CM | POA: Diagnosis not present

## 2019-07-01 DIAGNOSIS — M5136 Other intervertebral disc degeneration, lumbar region: Secondary | ICD-10-CM | POA: Diagnosis not present

## 2019-07-01 DIAGNOSIS — G894 Chronic pain syndrome: Secondary | ICD-10-CM | POA: Diagnosis not present

## 2019-07-01 DIAGNOSIS — M47818 Spondylosis without myelopathy or radiculopathy, sacral and sacrococcygeal region: Secondary | ICD-10-CM | POA: Diagnosis not present

## 2019-07-01 DIAGNOSIS — M545 Low back pain: Secondary | ICD-10-CM | POA: Diagnosis not present

## 2019-07-01 DIAGNOSIS — M25552 Pain in left hip: Secondary | ICD-10-CM | POA: Diagnosis not present

## 2019-07-01 DIAGNOSIS — Z79891 Long term (current) use of opiate analgesic: Secondary | ICD-10-CM | POA: Diagnosis not present

## 2019-07-05 DIAGNOSIS — E1143 Type 2 diabetes mellitus with diabetic autonomic (poly)neuropathy: Secondary | ICD-10-CM | POA: Diagnosis not present

## 2019-07-05 DIAGNOSIS — I10 Essential (primary) hypertension: Secondary | ICD-10-CM | POA: Diagnosis not present

## 2019-07-05 DIAGNOSIS — E7800 Pure hypercholesterolemia, unspecified: Secondary | ICD-10-CM | POA: Diagnosis not present

## 2019-07-05 DIAGNOSIS — M1990 Unspecified osteoarthritis, unspecified site: Secondary | ICD-10-CM | POA: Diagnosis not present

## 2019-07-21 ENCOUNTER — Ambulatory Visit (INDEPENDENT_AMBULATORY_CARE_PROVIDER_SITE_OTHER): Payer: Medicare HMO | Admitting: *Deleted

## 2019-07-21 ENCOUNTER — Other Ambulatory Visit: Payer: Self-pay

## 2019-07-21 DIAGNOSIS — T63441D Toxic effect of venom of bees, accidental (unintentional), subsequent encounter: Secondary | ICD-10-CM

## 2019-07-21 DIAGNOSIS — IMO0001 Reserved for inherently not codable concepts without codable children: Secondary | ICD-10-CM

## 2019-08-05 DIAGNOSIS — M1612 Unilateral primary osteoarthritis, left hip: Secondary | ICD-10-CM | POA: Diagnosis not present

## 2019-08-05 DIAGNOSIS — Z79891 Long term (current) use of opiate analgesic: Secondary | ICD-10-CM | POA: Diagnosis not present

## 2019-08-05 DIAGNOSIS — M545 Low back pain: Secondary | ICD-10-CM | POA: Diagnosis not present

## 2019-08-05 DIAGNOSIS — M47816 Spondylosis without myelopathy or radiculopathy, lumbar region: Secondary | ICD-10-CM | POA: Diagnosis not present

## 2019-08-05 DIAGNOSIS — G894 Chronic pain syndrome: Secondary | ICD-10-CM | POA: Diagnosis not present

## 2019-08-05 DIAGNOSIS — M5136 Other intervertebral disc degeneration, lumbar region: Secondary | ICD-10-CM | POA: Diagnosis not present

## 2019-08-05 DIAGNOSIS — Z1389 Encounter for screening for other disorder: Secondary | ICD-10-CM | POA: Diagnosis not present

## 2019-08-05 DIAGNOSIS — M25552 Pain in left hip: Secondary | ICD-10-CM | POA: Diagnosis not present

## 2019-08-31 DIAGNOSIS — K142 Median rhomboid glossitis: Secondary | ICD-10-CM | POA: Diagnosis not present

## 2019-08-31 DIAGNOSIS — Z86010 Personal history of colonic polyps: Secondary | ICD-10-CM | POA: Diagnosis not present

## 2019-08-31 DIAGNOSIS — K219 Gastro-esophageal reflux disease without esophagitis: Secondary | ICD-10-CM | POA: Diagnosis not present

## 2019-08-31 DIAGNOSIS — K141 Geographic tongue: Secondary | ICD-10-CM | POA: Diagnosis not present

## 2019-08-31 DIAGNOSIS — K143 Hypertrophy of tongue papillae: Secondary | ICD-10-CM | POA: Diagnosis not present

## 2019-08-31 DIAGNOSIS — K589 Irritable bowel syndrome without diarrhea: Secondary | ICD-10-CM | POA: Diagnosis not present

## 2019-09-02 DIAGNOSIS — M1612 Unilateral primary osteoarthritis, left hip: Secondary | ICD-10-CM | POA: Diagnosis not present

## 2019-09-02 DIAGNOSIS — M25552 Pain in left hip: Secondary | ICD-10-CM | POA: Diagnosis not present

## 2019-09-02 DIAGNOSIS — M545 Low back pain: Secondary | ICD-10-CM | POA: Diagnosis not present

## 2019-09-02 DIAGNOSIS — Z79891 Long term (current) use of opiate analgesic: Secondary | ICD-10-CM | POA: Diagnosis not present

## 2019-09-02 DIAGNOSIS — Z1389 Encounter for screening for other disorder: Secondary | ICD-10-CM | POA: Diagnosis not present

## 2019-09-02 DIAGNOSIS — M47816 Spondylosis without myelopathy or radiculopathy, lumbar region: Secondary | ICD-10-CM | POA: Diagnosis not present

## 2019-09-02 DIAGNOSIS — M5136 Other intervertebral disc degeneration, lumbar region: Secondary | ICD-10-CM | POA: Diagnosis not present

## 2019-09-02 DIAGNOSIS — G894 Chronic pain syndrome: Secondary | ICD-10-CM | POA: Diagnosis not present

## 2019-09-14 DIAGNOSIS — Z125 Encounter for screening for malignant neoplasm of prostate: Secondary | ICD-10-CM | POA: Diagnosis not present

## 2019-09-14 DIAGNOSIS — E1143 Type 2 diabetes mellitus with diabetic autonomic (poly)neuropathy: Secondary | ICD-10-CM | POA: Diagnosis not present

## 2019-09-14 DIAGNOSIS — Z79899 Other long term (current) drug therapy: Secondary | ICD-10-CM | POA: Diagnosis not present

## 2019-09-14 DIAGNOSIS — Z6841 Body Mass Index (BMI) 40.0 and over, adult: Secondary | ICD-10-CM | POA: Diagnosis not present

## 2019-09-14 DIAGNOSIS — E7800 Pure hypercholesterolemia, unspecified: Secondary | ICD-10-CM | POA: Diagnosis not present

## 2019-09-14 DIAGNOSIS — Z0000 Encounter for general adult medical examination without abnormal findings: Secondary | ICD-10-CM | POA: Diagnosis not present

## 2019-09-15 ENCOUNTER — Ambulatory Visit (INDEPENDENT_AMBULATORY_CARE_PROVIDER_SITE_OTHER): Payer: Medicare HMO | Admitting: *Deleted

## 2019-09-15 ENCOUNTER — Other Ambulatory Visit: Payer: Self-pay

## 2019-09-15 DIAGNOSIS — T63441D Toxic effect of venom of bees, accidental (unintentional), subsequent encounter: Secondary | ICD-10-CM

## 2019-09-15 DIAGNOSIS — IMO0001 Reserved for inherently not codable concepts without codable children: Secondary | ICD-10-CM

## 2019-09-23 DIAGNOSIS — M79606 Pain in leg, unspecified: Secondary | ICD-10-CM | POA: Diagnosis not present

## 2019-09-24 ENCOUNTER — Other Ambulatory Visit: Payer: Self-pay

## 2019-09-24 DIAGNOSIS — I10 Essential (primary) hypertension: Secondary | ICD-10-CM | POA: Insufficient documentation

## 2019-09-24 DIAGNOSIS — Z91030 Bee allergy status: Secondary | ICD-10-CM | POA: Insufficient documentation

## 2019-09-24 DIAGNOSIS — E119 Type 2 diabetes mellitus without complications: Secondary | ICD-10-CM | POA: Insufficient documentation

## 2019-09-27 NOTE — Progress Notes (Signed)
Cardiology Office Note:    Date:  09/28/2019   ID:  David Fitzgerald, DOB 1960/07/25, MRN [id number]  PCP:  Angelina Sheriff, MD  Cardiologist:  Shirlee More, MD    Referring MD: Angelina Sheriff, MD    ASSESSMENT:    1. Persistent atrial fibrillation ([place])   2. Chronic anticoagulation   3. Hypertensive heart disease without heart failure   4. Hypotension due to drugs    PLAN:    In order of problems listed above:  1. Appears stable rate controlled on beta-blocker anticoagulant but with his episodes of lightheadedness at risk for symptomatic bradycardia and a 1 week ZIO monitor applied. 2. He has had a urology evaluation no pathology back on his anticoagulant.  Until he bleeds again to stop it for several days 3. BP at target no orthostatic findings no longer on a diuretic continue metoprolol 4. No no recurrence clinically 5. Stable hyperlipidemia continue statin.  Recent lipid profile at target cholesterol 151 LDL 78 triglycerides 241 HDL 25.   Next appointment: 6 months   Medication Adjustments/Labs and Tests Ordered: Current medicines are reviewed at length with the patient today.  Concerns regarding medicines are outlined above.  Orders Placed This Encounter  Procedures   LONG TERM MONITOR (3-14 DAYS)   EKG 12-Lead   No orders of the defined types were placed in this encounter.   No chief complaint on file.   History of Present Illness:    David Fitzgerald is a 59 y.o. male with a hx of  atrial fibrillation CHADS2vasc of 2 hypertension type 2 diabetes and hyperlipidemia as well as obstructive sleep apnea and obesity hypoventilation syndrome last seen 12/03/2018. Compliance with diet, lifestyle and medications: Yes  He has now disabled due to back pain He has had painless hematuria seen by urology has undergone evaluation including cystoscopy and is back on his anticoagulant. He has episodes where he feels weak and lightheaded not postural he has not lost  consciousness.  Standing he has no drop in blood pressure and no high blood pressure affecting medicine he takes his beta-blocker for heart rate control.  He has had no syncope chest pain edema shortness of breath.  Back on his anticoagulant no recurrent bleeding. Past Medical History:  Diagnosis Date   Allergy to pollen 04/06/2015   Anxiety 02/06/2017   Arthritis 02/06/2017   Bee sting allergy    wasp / mixed vespid   Bell's palsy 02/24/2013   Chronic venous insufficiency 12/14/2014   Diabetes ([place])    Edema 12/14/2014   Hyperlipidemia, mixed 12/14/2014   Hypertension    Lumbar pain with radiation down left leg 07/09/2012   Morbid obesity ([place]) 12/14/2014   Morbid obesity with BMI of 45.0-49.9, adult ([place]) 07/09/2012   Obesity hypoventilation syndrome ([place]) 12/14/2014   OSA treated with BiPAP 12/14/2014   Pain syndrome, chronic 07/09/2012   Pernicious anemia 02/06/2017   Polyneuropathy in diabetes ([place]) 02/24/2013   Syndrome affecting cervical region 02/24/2013   Trigeminal neuralgia 02/24/2013   Type 2 diabetes mellitus with diabetic neuropathy ([place]) 12/14/2014    Past Surgical History:  Procedure Laterality Date   ADENOIDECTOMY     BACK SURGERY  2002 /2003   CARPAL TUNNEL RELEASE Bilateral    KNEE SURGERY Left 2005   SHOULDER SURGERY Left 1998   TONSILLECTOMY      Current Medications: Current Meds  Medication Sig   Cyanocobalamin (VITAMIN B 12 PO) Take 2,500 mcg by mouth  daily.   dicyclomine (BENTYL) 20 MG tablet Take 1 tablet by mouth 2 (two) times daily.   DULoxetine (CYMBALTA) 60 MG capsule Take 60 mg by mouth daily.    ELIQUIS 5 MG TABS tablet TAKE 1 TABLET BY MOUTH TWICE DAILY   EPINEPHrine 0.3 mg/0.3 mL IJ SOAJ injection Use for life-threatening allergic reactions   furosemide (LASIX) 40 MG tablet Take 40 mg by mouth daily as needed.    gabapentin (NEURONTIN) 300 MG capsule Take 600 mg by mouth 3 (three) times daily.    metFORMIN (GLUCOPHAGE) 500 MG  tablet Take 500 mg by mouth daily with breakfast.   metoprolol tartrate (LOPRESSOR) 25 MG tablet Take 1 tablet (25 mg total) by mouth 3 (three) times daily.   MIXED VESPID VENOM PROTEIN [place] Inject into the skin.   naloxone (NARCAN) nasal spray 4 mg/0.1 mL Place 1 spray into the nose. USE 1 SPRAY NASALLY AS NEEDED FOR OPIOID OVERDOSE EMERGENCY   nitroGLYCERIN (NITROSTAT) 0.4 MG SL tablet Place 0.4 mg under the tongue every 5 (five) minutes as needed. Reported on 04/26/2015   ondansetron (ZOFRAN-ODT) 4 MG disintegrating tablet Take 4 mg by mouth every 8 (eight) hours as needed for nausea or vomiting.   oxyCODONE-acetaminophen (PERCOCET) 7.5-325 MG tablet Take 1 tablet by mouth every 8 (eight) hours as needed for severe pain.   pantoprazole (PROTONIX) 40 MG tablet Take 40 mg by mouth daily.   simvastatin (ZOCOR) 20 MG tablet Take 20 mg by mouth daily.   tamsulosin (FLOMAX) 0.4 MG CAPS capsule Take 0.4 mg by mouth daily.   VENOMIL WASP VENOM IJ Inject as directed.     Allergies:   Penicillins, Aspirin, and Bee venom   Social History   Socioeconomic History   Marital status: Married    Spouse name: Not on file   Number of children: Not on file   Years of education: Not on file   Highest education level: Not on file  Occupational History   Not on file  Tobacco Use   Smoking status: Former Smoker    Packs/day: 3.00    Years: 35.00    Pack years: 105.00    Types: Cigarettes    Quit date: 04/13/2006    Years since quitting: 13.4   Smokeless tobacco: Never Used  Vaping Use   Vaping Use: Never used  Substance and Sexual Activity   Alcohol use: No    Alcohol/week: 0.0 standard drinks   Drug use: No   Sexual activity: Not on file  Other Topics Concern   Not on file  Social History Narrative   Not on file   Social Determinants of Health   Financial Resource Strain:    Difficulty of Paying Living Expenses: Not on file  Food Insecurity:    Worried About Paediatric nurse in the Last Year: Not on file   [employer] of Food in the Last Year: Not on file  Transportation Needs:    Lack of Transportation (Medical): Not on file   Lack of Transportation (Non-Medical): Not on file  Physical Activity:    Days of Exercise per Week: Not on file   Minutes of Exercise per Session: Not on file  Stress:    Feeling of Stress : Not on file  Social Connections:    Frequency of Communication with Friends and Family: Not on file   Frequency of Social Gatherings with Friends and Family: Not on file   Attends Religious Services: Not on file  Active Member of Clubs or Organizations: Not on file   Attends Archivist Meetings: Not on file   Marital Status: Not on file     Family History: The patient's family history includes Atrial fibrillation in his father; Breast cancer in his maternal grandmother and mother; CAD in his father; Heart disease in his father and paternal uncle; Stomach cancer in his paternal aunt. ROS:   Please see the history of present illness.    All other systems reviewed and are negative.  EKGs/Labs/Other Studies Reviewed:    The following studies were reviewed today:  EKG:  EKG ordered today and personally reviewed.  The ekg ordered today demonstrates atrial fibrillation controlled rate 100 bpm  Recent Labs: 12/03/2018: ALT 17; BUN 12; Creatinine, Ser 1.16; Potassium 4.4; Sodium 139  Recent Lipid Panel    Component Value Date/Time   CHOL 156 12/03/2018 1502   TRIG 301 (H) 12/03/2018 1502   HDL 29 (L) 12/03/2018 1502   CHOLHDL 5.4 (H) 12/03/2018 1502   LDLCALC 78 12/03/2018 1502    Physical Exam:    VS:  BP 140/76    Pulse 100    Ht 6\' 2"  (1.88 m)    Wt (!) 377 lb (171 kg)    SpO2 94%    BMI 48.40 kg/m     Wt Readings from Last 3 Encounters:  09/28/19 (!) 377 lb (171 kg)  12/03/18 (!) 379 lb (171.9 kg)  12/26/17 (!) 379 lb 6.4 oz (172.1 kg)     GEN: Obese well nourished, well developed in no acute  distress HEENT: Normal NECK: No JVD; No carotid bruits LYMPHATICS: No lymphadenopathy CARDIAC: S1 variable irregular rhythm no murmurs, rubs, gallops RESPIRATORY:  Clear to auscultation without rales, wheezing or rhonchi  ABDOMEN: Soft, non-tender, non-distended MUSCULOSKELETAL:  No edema; No deformity  SKIN: Warm and dry NEUROLOGIC:  Alert and oriented x 3 PSYCHIATRIC:  Normal affect    Signed, Shirlee More, MD  09/28/2019 3:50 PM    [facility]

## 2019-09-28 ENCOUNTER — Other Ambulatory Visit: Payer: Self-pay

## 2019-09-28 ENCOUNTER — Ambulatory Visit (INDEPENDENT_AMBULATORY_CARE_PROVIDER_SITE_OTHER): Payer: Medicare HMO

## 2019-09-28 ENCOUNTER — Ambulatory Visit: Payer: Medicare HMO | Admitting: Cardiology

## 2019-09-28 ENCOUNTER — Encounter: Payer: Self-pay | Admitting: Cardiology

## 2019-09-28 VITALS — BP 140/76 | HR 100 | Ht 74.0 in | Wt 377.0 lb

## 2019-09-28 DIAGNOSIS — I4819 Other persistent atrial fibrillation: Secondary | ICD-10-CM | POA: Diagnosis not present

## 2019-09-28 DIAGNOSIS — Z7901 Long term (current) use of anticoagulants: Secondary | ICD-10-CM

## 2019-09-28 DIAGNOSIS — I952 Hypotension due to drugs: Secondary | ICD-10-CM | POA: Diagnosis not present

## 2019-09-28 DIAGNOSIS — I119 Hypertensive heart disease without heart failure: Secondary | ICD-10-CM

## 2019-09-28 NOTE — Patient Instructions (Signed)
Medication Instructions:  Your physician recommends that you continue on your current medications as directed. Please refer to the Current Medication list given to you today.  *If you need a refill on your cardiac medications before your next appointment, please call your pharmacy*   Lab Work: None If you have labs (blood work) drawn today and your tests are completely normal, you will receive your results only by: MyChart Message (if you have MyChart) OR A paper copy in the mail If you have any lab test that is abnormal or we need to change your treatment, we will call you to review the results.   Testing/Procedures: A zio monitor was ordered today. It will remain on for 7 days. You will then return monitor and event diary in provided box. It takes 1-2 weeks for report to be downloaded and returned to us. We will call you with the results. If monitor falls off or has orange flashing light, please call Zio for further instructions.     Follow-Up: At [facility], you and your health needs are our priority.  As part of our continuing mission to provide you with exceptional heart care, we have created designated Provider Care Teams.  These Care Teams include your primary Cardiologist (physician) and Advanced Practice Providers (APPs -  Physician Assistants and Nurse Practitioners) who all work together to provide you with the care you need, when you need it.  We recommend signing up for the patient portal called "MyChart".  Sign up information is provided on this After Visit Summary.  MyChart is used to connect with patients for Virtual Visits (Telemedicine).  Patients are able to view lab/test results, encounter notes, upcoming appointments, etc.  Non-urgent messages can be sent to your provider as well.   To learn more about what you can do with MyChart, go to [email or web address].    Your next appointment:   6 month(s)  The format for your next appointment:   In Person  Provider:    Brian Munley, MD    Other Instructions   

## 2019-09-30 DIAGNOSIS — Z1389 Encounter for screening for other disorder: Secondary | ICD-10-CM | POA: Diagnosis not present

## 2019-09-30 DIAGNOSIS — M545 Low back pain: Secondary | ICD-10-CM | POA: Diagnosis not present

## 2019-09-30 DIAGNOSIS — M5136 Other intervertebral disc degeneration, lumbar region: Secondary | ICD-10-CM | POA: Diagnosis not present

## 2019-09-30 DIAGNOSIS — M1612 Unilateral primary osteoarthritis, left hip: Secondary | ICD-10-CM | POA: Diagnosis not present

## 2019-09-30 DIAGNOSIS — M47816 Spondylosis without myelopathy or radiculopathy, lumbar region: Secondary | ICD-10-CM | POA: Diagnosis not present

## 2019-09-30 DIAGNOSIS — G894 Chronic pain syndrome: Secondary | ICD-10-CM | POA: Diagnosis not present

## 2019-09-30 DIAGNOSIS — Z79891 Long term (current) use of opiate analgesic: Secondary | ICD-10-CM | POA: Diagnosis not present

## 2019-09-30 DIAGNOSIS — M25552 Pain in left hip: Secondary | ICD-10-CM | POA: Diagnosis not present

## 2019-10-11 DIAGNOSIS — G4733 Obstructive sleep apnea (adult) (pediatric): Secondary | ICD-10-CM | POA: Diagnosis not present

## 2019-10-22 DIAGNOSIS — I482 Chronic atrial fibrillation, unspecified: Secondary | ICD-10-CM | POA: Diagnosis not present

## 2019-10-26 ENCOUNTER — Other Ambulatory Visit: Payer: Self-pay | Admitting: Cardiology

## 2019-10-26 NOTE — Telephone Encounter (Signed)
Last OV 09/28/2019 Dx Atrial fibrillation Scr = 1.16 Male 58yo Wt 171kg

## 2019-10-28 DIAGNOSIS — M47818 Spondylosis without myelopathy or radiculopathy, sacral and sacrococcygeal region: Secondary | ICD-10-CM | POA: Diagnosis not present

## 2019-10-28 DIAGNOSIS — G894 Chronic pain syndrome: Secondary | ICD-10-CM | POA: Diagnosis not present

## 2019-10-28 DIAGNOSIS — M5136 Other intervertebral disc degeneration, lumbar region: Secondary | ICD-10-CM | POA: Diagnosis not present

## 2019-10-28 DIAGNOSIS — M545 Low back pain: Secondary | ICD-10-CM | POA: Diagnosis not present

## 2019-10-28 DIAGNOSIS — Z79891 Long term (current) use of opiate analgesic: Secondary | ICD-10-CM | POA: Diagnosis not present

## 2019-10-28 DIAGNOSIS — M47816 Spondylosis without myelopathy or radiculopathy, lumbar region: Secondary | ICD-10-CM | POA: Diagnosis not present

## 2019-10-28 DIAGNOSIS — M1612 Unilateral primary osteoarthritis, left hip: Secondary | ICD-10-CM | POA: Diagnosis not present

## 2019-10-28 DIAGNOSIS — M25552 Pain in left hip: Secondary | ICD-10-CM | POA: Diagnosis not present

## 2019-11-10 ENCOUNTER — Other Ambulatory Visit: Payer: Self-pay

## 2019-11-10 ENCOUNTER — Ambulatory Visit (INDEPENDENT_AMBULATORY_CARE_PROVIDER_SITE_OTHER): Payer: Medicare HMO | Admitting: *Deleted

## 2019-11-10 DIAGNOSIS — G4733 Obstructive sleep apnea (adult) (pediatric): Secondary | ICD-10-CM | POA: Diagnosis not present

## 2019-11-10 DIAGNOSIS — T63441D Toxic effect of venom of bees, accidental (unintentional), subsequent encounter: Secondary | ICD-10-CM | POA: Diagnosis not present

## 2019-11-29 DIAGNOSIS — G894 Chronic pain syndrome: Secondary | ICD-10-CM | POA: Diagnosis not present

## 2019-11-29 DIAGNOSIS — M47818 Spondylosis without myelopathy or radiculopathy, sacral and sacrococcygeal region: Secondary | ICD-10-CM | POA: Diagnosis not present

## 2019-11-29 DIAGNOSIS — Z981 Arthrodesis status: Secondary | ICD-10-CM | POA: Diagnosis not present

## 2019-11-29 DIAGNOSIS — M5136 Other intervertebral disc degeneration, lumbar region: Secondary | ICD-10-CM | POA: Diagnosis not present

## 2019-11-29 DIAGNOSIS — M47816 Spondylosis without myelopathy or radiculopathy, lumbar region: Secondary | ICD-10-CM | POA: Diagnosis not present

## 2019-11-29 DIAGNOSIS — M25552 Pain in left hip: Secondary | ICD-10-CM | POA: Diagnosis not present

## 2019-11-29 DIAGNOSIS — J329 Chronic sinusitis, unspecified: Secondary | ICD-10-CM | POA: Diagnosis not present

## 2019-11-29 DIAGNOSIS — M1612 Unilateral primary osteoarthritis, left hip: Secondary | ICD-10-CM | POA: Diagnosis not present

## 2019-11-29 DIAGNOSIS — Z79891 Long term (current) use of opiate analgesic: Secondary | ICD-10-CM | POA: Diagnosis not present

## 2019-12-11 DIAGNOSIS — G4733 Obstructive sleep apnea (adult) (pediatric): Secondary | ICD-10-CM | POA: Diagnosis not present

## 2019-12-24 DIAGNOSIS — M25552 Pain in left hip: Secondary | ICD-10-CM | POA: Diagnosis not present

## 2019-12-24 DIAGNOSIS — M47818 Spondylosis without myelopathy or radiculopathy, sacral and sacrococcygeal region: Secondary | ICD-10-CM | POA: Diagnosis not present

## 2019-12-24 DIAGNOSIS — M5136 Other intervertebral disc degeneration, lumbar region: Secondary | ICD-10-CM | POA: Diagnosis not present

## 2019-12-24 DIAGNOSIS — G894 Chronic pain syndrome: Secondary | ICD-10-CM | POA: Diagnosis not present

## 2019-12-24 DIAGNOSIS — M47816 Spondylosis without myelopathy or radiculopathy, lumbar region: Secondary | ICD-10-CM | POA: Diagnosis not present

## 2019-12-24 DIAGNOSIS — Z79891 Long term (current) use of opiate analgesic: Secondary | ICD-10-CM | POA: Diagnosis not present

## 2019-12-24 DIAGNOSIS — M1612 Unilateral primary osteoarthritis, left hip: Secondary | ICD-10-CM | POA: Diagnosis not present

## 2020-01-05 ENCOUNTER — Other Ambulatory Visit: Payer: Self-pay

## 2020-01-05 ENCOUNTER — Ambulatory Visit (INDEPENDENT_AMBULATORY_CARE_PROVIDER_SITE_OTHER): Payer: Medicare HMO | Admitting: *Deleted

## 2020-01-05 DIAGNOSIS — T63441D Toxic effect of venom of bees, accidental (unintentional), subsequent encounter: Secondary | ICD-10-CM | POA: Diagnosis not present

## 2020-01-10 DIAGNOSIS — G4733 Obstructive sleep apnea (adult) (pediatric): Secondary | ICD-10-CM | POA: Diagnosis not present

## 2020-01-19 DIAGNOSIS — M47818 Spondylosis without myelopathy or radiculopathy, sacral and sacrococcygeal region: Secondary | ICD-10-CM | POA: Diagnosis not present

## 2020-01-19 DIAGNOSIS — M5136 Other intervertebral disc degeneration, lumbar region: Secondary | ICD-10-CM | POA: Diagnosis not present

## 2020-01-19 DIAGNOSIS — Z79891 Long term (current) use of opiate analgesic: Secondary | ICD-10-CM | POA: Diagnosis not present

## 2020-01-19 DIAGNOSIS — G894 Chronic pain syndrome: Secondary | ICD-10-CM | POA: Diagnosis not present

## 2020-01-19 DIAGNOSIS — M47816 Spondylosis without myelopathy or radiculopathy, lumbar region: Secondary | ICD-10-CM | POA: Diagnosis not present

## 2020-01-19 DIAGNOSIS — M25552 Pain in left hip: Secondary | ICD-10-CM | POA: Diagnosis not present

## 2020-01-19 DIAGNOSIS — M1612 Unilateral primary osteoarthritis, left hip: Secondary | ICD-10-CM | POA: Diagnosis not present

## 2020-01-31 ENCOUNTER — Encounter (HOSPITAL_COMMUNITY): Payer: Self-pay | Admitting: Emergency Medicine

## 2020-01-31 ENCOUNTER — Emergency Department (HOSPITAL_COMMUNITY): Payer: Medicare HMO

## 2020-01-31 ENCOUNTER — Encounter (HOSPITAL_COMMUNITY)
Admission: EM | Disposition: A | Discharge status: Skilled Nursing Facility | Payer: Self-pay | Source: Home / Self Care | Attending: Orthopedic Surgery

## 2020-01-31 ENCOUNTER — Observation Stay (HOSPITAL_COMMUNITY): Payer: Medicare HMO | Admitting: Anesthesiology

## 2020-01-31 ENCOUNTER — Other Ambulatory Visit (HOSPITAL_COMMUNITY): Payer: Self-pay

## 2020-01-31 ENCOUNTER — Observation Stay (HOSPITAL_COMMUNITY): Payer: Medicare HMO

## 2020-01-31 ENCOUNTER — Inpatient Hospital Stay (HOSPITAL_COMMUNITY)
Admission: EM | Admit: 2020-01-31 | Discharge: 2020-02-16 | DRG: 493 | Disposition: A | Discharge status: Skilled Nursing Facility | Payer: Medicare HMO | Attending: Orthopedic Surgery | Admitting: Orthopedic Surgery

## 2020-01-31 DIAGNOSIS — Z96662 Presence of left artificial ankle joint: Secondary | ICD-10-CM | POA: Diagnosis not present

## 2020-01-31 DIAGNOSIS — Z6841 Body Mass Index (BMI) 40.0 and over, adult: Secondary | ICD-10-CM | POA: Diagnosis not present

## 2020-01-31 DIAGNOSIS — S82045A Nondisplaced comminuted fracture of left patella, initial encounter for closed fracture: Secondary | ICD-10-CM | POA: Diagnosis not present

## 2020-01-31 DIAGNOSIS — E782 Mixed hyperlipidemia: Secondary | ICD-10-CM | POA: Diagnosis not present

## 2020-01-31 DIAGNOSIS — Z886 Allergy status to analgesic agent status: Secondary | ICD-10-CM

## 2020-01-31 DIAGNOSIS — S82042A Displaced comminuted fracture of left patella, initial encounter for closed fracture: Secondary | ICD-10-CM | POA: Diagnosis not present

## 2020-01-31 DIAGNOSIS — Z20822 Contact with and (suspected) exposure to covid-19: Secondary | ICD-10-CM | POA: Diagnosis present

## 2020-01-31 DIAGNOSIS — E876 Hypokalemia: Secondary | ICD-10-CM | POA: Diagnosis present

## 2020-01-31 DIAGNOSIS — N179 Acute kidney failure, unspecified: Secondary | ICD-10-CM | POA: Diagnosis not present

## 2020-01-31 DIAGNOSIS — Z01818 Encounter for other preprocedural examination: Secondary | ICD-10-CM | POA: Diagnosis not present

## 2020-01-31 DIAGNOSIS — Z880 Allergy status to penicillin: Secondary | ICD-10-CM

## 2020-01-31 DIAGNOSIS — Z4789 Encounter for other orthopedic aftercare: Secondary | ICD-10-CM | POA: Diagnosis not present

## 2020-01-31 DIAGNOSIS — F419 Anxiety disorder, unspecified: Secondary | ICD-10-CM | POA: Diagnosis present

## 2020-01-31 DIAGNOSIS — R52 Pain, unspecified: Secondary | ICD-10-CM

## 2020-01-31 DIAGNOSIS — S82899A Other fracture of unspecified lower leg, initial encounter for closed fracture: Secondary | ICD-10-CM

## 2020-01-31 DIAGNOSIS — D72829 Elevated white blood cell count, unspecified: Secondary | ICD-10-CM | POA: Diagnosis not present

## 2020-01-31 DIAGNOSIS — S93432A Sprain of tibiofibular ligament of left ankle, initial encounter: Secondary | ICD-10-CM | POA: Diagnosis not present

## 2020-01-31 DIAGNOSIS — E1140 Type 2 diabetes mellitus with diabetic neuropathy, unspecified: Secondary | ICD-10-CM | POA: Diagnosis present

## 2020-01-31 DIAGNOSIS — I119 Hypertensive heart disease without heart failure: Secondary | ICD-10-CM | POA: Diagnosis present

## 2020-01-31 DIAGNOSIS — I2510 Atherosclerotic heart disease of native coronary artery without angina pectoris: Secondary | ICD-10-CM | POA: Diagnosis present

## 2020-01-31 DIAGNOSIS — W19XXXA Unspecified fall, initial encounter: Secondary | ICD-10-CM | POA: Diagnosis not present

## 2020-01-31 DIAGNOSIS — S82892A Other fracture of left lower leg, initial encounter for closed fracture: Secondary | ICD-10-CM | POA: Diagnosis not present

## 2020-01-31 DIAGNOSIS — Z9889 Other specified postprocedural states: Secondary | ICD-10-CM | POA: Diagnosis not present

## 2020-01-31 DIAGNOSIS — Z713 Dietary counseling and surveillance: Secondary | ICD-10-CM

## 2020-01-31 DIAGNOSIS — N400 Enlarged prostate without lower urinary tract symptoms: Secondary | ICD-10-CM | POA: Diagnosis present

## 2020-01-31 DIAGNOSIS — M25552 Pain in left hip: Secondary | ICD-10-CM | POA: Diagnosis not present

## 2020-01-31 DIAGNOSIS — E872 Acidosis: Secondary | ICD-10-CM | POA: Diagnosis not present

## 2020-01-31 DIAGNOSIS — S8011XA Contusion of right lower leg, initial encounter: Secondary | ICD-10-CM | POA: Diagnosis not present

## 2020-01-31 DIAGNOSIS — S7002XA Contusion of left hip, initial encounter: Secondary | ICD-10-CM | POA: Diagnosis not present

## 2020-01-31 DIAGNOSIS — Z7901 Long term (current) use of anticoagulants: Secondary | ICD-10-CM | POA: Diagnosis present

## 2020-01-31 DIAGNOSIS — J301 Allergic rhinitis due to pollen: Secondary | ICD-10-CM | POA: Diagnosis present

## 2020-01-31 DIAGNOSIS — S9305XA Dislocation of left ankle joint, initial encounter: Secondary | ICD-10-CM | POA: Diagnosis present

## 2020-01-31 DIAGNOSIS — Z91038 Other insect allergy status: Secondary | ICD-10-CM

## 2020-01-31 DIAGNOSIS — Z7984 Long term (current) use of oral hypoglycemic drugs: Secondary | ICD-10-CM

## 2020-01-31 DIAGNOSIS — S8262XA Displaced fracture of lateral malleolus of left fibula, initial encounter for closed fracture: Secondary | ICD-10-CM | POA: Diagnosis not present

## 2020-01-31 DIAGNOSIS — M1990 Unspecified osteoarthritis, unspecified site: Secondary | ICD-10-CM | POA: Diagnosis present

## 2020-01-31 DIAGNOSIS — S82852C Displaced trimalleolar fracture of left lower leg, initial encounter for open fracture type IIIA, IIIB, or IIIC: Secondary | ICD-10-CM | POA: Diagnosis not present

## 2020-01-31 DIAGNOSIS — Z87891 Personal history of nicotine dependence: Secondary | ICD-10-CM

## 2020-01-31 DIAGNOSIS — G4733 Obstructive sleep apnea (adult) (pediatric): Secondary | ICD-10-CM | POA: Diagnosis not present

## 2020-01-31 DIAGNOSIS — S9305XD Dislocation of left ankle joint, subsequent encounter: Secondary | ICD-10-CM | POA: Diagnosis not present

## 2020-01-31 DIAGNOSIS — W1830XA Fall on same level, unspecified, initial encounter: Secondary | ICD-10-CM | POA: Diagnosis present

## 2020-01-31 DIAGNOSIS — S0990XA Unspecified injury of head, initial encounter: Secondary | ICD-10-CM | POA: Diagnosis not present

## 2020-01-31 DIAGNOSIS — S82892D Other fracture of left lower leg, subsequent encounter for closed fracture with routine healing: Secondary | ICD-10-CM | POA: Diagnosis not present

## 2020-01-31 DIAGNOSIS — E1142 Type 2 diabetes mellitus with diabetic polyneuropathy: Secondary | ICD-10-CM | POA: Diagnosis present

## 2020-01-31 DIAGNOSIS — I4821 Permanent atrial fibrillation: Secondary | ICD-10-CM | POA: Diagnosis not present

## 2020-01-31 DIAGNOSIS — I878 Other specified disorders of veins: Secondary | ICD-10-CM | POA: Diagnosis present

## 2020-01-31 DIAGNOSIS — I6523 Occlusion and stenosis of bilateral carotid arteries: Secondary | ICD-10-CM | POA: Diagnosis not present

## 2020-01-31 DIAGNOSIS — Z419 Encounter for procedure for purposes other than remedying health state, unspecified: Secondary | ICD-10-CM

## 2020-01-31 DIAGNOSIS — R7303 Prediabetes: Secondary | ICD-10-CM | POA: Diagnosis not present

## 2020-01-31 DIAGNOSIS — T148XXA Other injury of unspecified body region, initial encounter: Secondary | ICD-10-CM

## 2020-01-31 DIAGNOSIS — D62 Acute posthemorrhagic anemia: Secondary | ICD-10-CM | POA: Diagnosis not present

## 2020-01-31 DIAGNOSIS — I445 Left posterior fascicular block: Secondary | ICD-10-CM | POA: Diagnosis present

## 2020-01-31 DIAGNOSIS — J320 Chronic maxillary sinusitis: Secondary | ICD-10-CM | POA: Diagnosis not present

## 2020-01-31 DIAGNOSIS — S82852B Displaced trimalleolar fracture of left lower leg, initial encounter for open fracture type I or II: Principal | ICD-10-CM | POA: Diagnosis present

## 2020-01-31 DIAGNOSIS — Z48817 Encounter for surgical aftercare following surgery on the skin and subcutaneous tissue: Secondary | ICD-10-CM | POA: Diagnosis not present

## 2020-01-31 DIAGNOSIS — Z79891 Long term (current) use of opiate analgesic: Secondary | ICD-10-CM

## 2020-01-31 DIAGNOSIS — G8918 Other acute postprocedural pain: Secondary | ICD-10-CM | POA: Diagnosis not present

## 2020-01-31 DIAGNOSIS — G894 Chronic pain syndrome: Secondary | ICD-10-CM | POA: Diagnosis present

## 2020-01-31 DIAGNOSIS — Z91030 Bee allergy status: Secondary | ICD-10-CM

## 2020-01-31 DIAGNOSIS — E559 Vitamin D deficiency, unspecified: Secondary | ICD-10-CM | POA: Diagnosis present

## 2020-01-31 DIAGNOSIS — S82832B Other fracture of upper and lower end of left fibula, initial encounter for open fracture type I or II: Secondary | ICD-10-CM | POA: Diagnosis not present

## 2020-01-31 DIAGNOSIS — I872 Venous insufficiency (chronic) (peripheral): Secondary | ICD-10-CM | POA: Diagnosis present

## 2020-01-31 DIAGNOSIS — Z23 Encounter for immunization: Secondary | ICD-10-CM

## 2020-01-31 DIAGNOSIS — Z79899 Other long term (current) drug therapy: Secondary | ICD-10-CM

## 2020-01-31 DIAGNOSIS — I10 Essential (primary) hypertension: Secondary | ICD-10-CM | POA: Diagnosis not present

## 2020-01-31 DIAGNOSIS — S82892B Other fracture of left lower leg, initial encounter for open fracture type I or II: Secondary | ICD-10-CM

## 2020-01-31 DIAGNOSIS — S82852A Displaced trimalleolar fracture of left lower leg, initial encounter for closed fracture: Secondary | ICD-10-CM

## 2020-01-31 DIAGNOSIS — Z8249 Family history of ischemic heart disease and other diseases of the circulatory system: Secondary | ICD-10-CM

## 2020-01-31 DIAGNOSIS — K219 Gastro-esophageal reflux disease without esophagitis: Secondary | ICD-10-CM | POA: Diagnosis present

## 2020-01-31 DIAGNOSIS — R0902 Hypoxemia: Secondary | ICD-10-CM | POA: Diagnosis not present

## 2020-01-31 DIAGNOSIS — S82452A Displaced comminuted fracture of shaft of left fibula, initial encounter for closed fracture: Secondary | ICD-10-CM | POA: Diagnosis not present

## 2020-01-31 DIAGNOSIS — M2550 Pain in unspecified joint: Secondary | ICD-10-CM | POA: Diagnosis not present

## 2020-01-31 DIAGNOSIS — R58 Hemorrhage, not elsewhere classified: Secondary | ICD-10-CM | POA: Diagnosis not present

## 2020-01-31 DIAGNOSIS — S82002A Unspecified fracture of left patella, initial encounter for closed fracture: Secondary | ICD-10-CM

## 2020-01-31 DIAGNOSIS — Z7401 Bed confinement status: Secondary | ICD-10-CM | POA: Diagnosis not present

## 2020-01-31 DIAGNOSIS — J80 Acute respiratory distress syndrome: Secondary | ICD-10-CM | POA: Diagnosis not present

## 2020-01-31 DIAGNOSIS — I4891 Unspecified atrial fibrillation: Secondary | ICD-10-CM | POA: Diagnosis not present

## 2020-01-31 DIAGNOSIS — F1190 Opioid use, unspecified, uncomplicated: Secondary | ICD-10-CM | POA: Diagnosis present

## 2020-01-31 HISTORY — DX: Other fracture of unspecified lower leg, initial encounter for closed fracture: S82.899A

## 2020-01-31 HISTORY — DX: Other fracture of left lower leg, initial encounter for open fracture type I or II: S82.892B

## 2020-01-31 HISTORY — PX: ORIF ANKLE FRACTURE: SHX5408

## 2020-01-31 HISTORY — PX: ORIF ANKLE FRACTURE: SUR919

## 2020-01-31 HISTORY — PX: I & D EXTREMITY: SHX5045

## 2020-01-31 LAB — URINALYSIS, ROUTINE W REFLEX MICROSCOPIC
Bacteria, UA: NONE SEEN
Bilirubin Urine: NEGATIVE
Glucose, UA: >=500 mg/dL — AB
Ketones, ur: 5 mg/dL — AB
Leukocytes,Ua: NEGATIVE
Nitrite: NEGATIVE
Protein, ur: NEGATIVE mg/dL
Specific Gravity, Urine: 1.013 (ref 1.005–1.030)
pH: 5 (ref 5.0–8.0)

## 2020-01-31 LAB — RESP PANEL BY RT-PCR (FLU A&B, COVID) ARPGX2
Influenza A by PCR: NEGATIVE
Influenza B by PCR: NEGATIVE
SARS Coronavirus 2 by RT PCR: NEGATIVE

## 2020-01-31 LAB — CBC
HCT: 43.6 % (ref 39.0–52.0)
Hemoglobin: 14.6 g/dL (ref 13.0–17.0)
MCH: 30.9 pg (ref 26.0–34.0)
MCHC: 33.5 g/dL (ref 30.0–36.0)
MCV: 92.2 fL (ref 80.0–100.0)
Platelets: 213 10*3/uL (ref 150–400)
RBC: 4.73 MIL/uL (ref 4.22–5.81)
RDW: 14.9 % (ref 11.5–15.5)
WBC: 9.2 10*3/uL (ref 4.0–10.5)
nRBC: 0 % (ref 0.0–0.2)

## 2020-01-31 LAB — I-STAT CHEM 8, ED
BUN: 13 mg/dL (ref 6–20)
Calcium, Ion: 1.1 mmol/L — ABNORMAL LOW (ref 1.15–1.40)
Chloride: 99 mmol/L (ref 98–111)
Creatinine, Ser: 1 mg/dL (ref 0.61–1.24)
Glucose, Bld: 155 mg/dL — ABNORMAL HIGH (ref 70–99)
HCT: 44 % (ref 39.0–52.0)
Hemoglobin: 15 g/dL (ref 13.0–17.0)
Potassium: 3.8 mmol/L (ref 3.5–5.1)
Sodium: 138 mmol/L (ref 135–145)
TCO2: 25 mmol/L (ref 22–32)

## 2020-01-31 LAB — MAGNESIUM: Magnesium: 2.1 mg/dL (ref 1.7–2.4)

## 2020-01-31 LAB — PROTIME-INR
INR: 1.1 (ref 0.8–1.2)
Prothrombin Time: 14 seconds (ref 11.4–15.2)

## 2020-01-31 LAB — ETHANOL: Alcohol, Ethyl (B): <10 mg/dL (ref <10)

## 2020-01-31 LAB — COMPREHENSIVE METABOLIC PANEL
ALT: 24 U/L (ref 0–44)
AST: 29 U/L (ref 15–41)
Albumin: 3.4 g/dL — ABNORMAL LOW (ref 3.5–5.0)
Alkaline Phosphatase: 66 U/L (ref 38–126)
Anion gap: 11 (ref 5–15)
BUN: 11 mg/dL (ref 6–20)
CO2: 26 mmol/L (ref 22–32)
Calcium: 8.9 mg/dL (ref 8.9–10.3)
Chloride: 100 mmol/L (ref 98–111)
Creatinine, Ser: 1.12 mg/dL (ref 0.61–1.24)
GFR, Estimated: >60 mL/min (ref >60)
Glucose, Bld: 165 mg/dL — ABNORMAL HIGH (ref 70–99)
Potassium: 3.8 mmol/L (ref 3.5–5.1)
Sodium: 137 mmol/L (ref 135–145)
Total Bilirubin: 0.7 mg/dL (ref 0.3–1.2)
Total Protein: 7.2 g/dL (ref 6.5–8.1)

## 2020-01-31 LAB — LACTIC ACID, PLASMA
Lactic Acid, Venous: 2.6 mmol/L (ref 0.5–1.9)
Lactic Acid, Venous: 2.4 mmol/L (ref 0.5–1.9)
Lactic Acid, Venous: 3.9 mmol/L (ref 0.5–1.9)

## 2020-01-31 LAB — SAMPLE TO BLOOD BANK

## 2020-01-31 LAB — SURGICAL PCR SCREEN
MRSA, PCR: NEGATIVE
Staphylococcus aureus: NEGATIVE

## 2020-01-31 LAB — PHOSPHORUS: Phosphorus: 2.9 mg/dL (ref 2.5–4.6)

## 2020-01-31 LAB — GLUCOSE, CAPILLARY
Glucose-Capillary: 121 mg/dL — ABNORMAL HIGH (ref 70–99)
Glucose-Capillary: 173 mg/dL — ABNORMAL HIGH (ref 70–99)

## 2020-01-31 SURGERY — OPEN REDUCTION INTERNAL FIXATION (ORIF) ANKLE FRACTURE
Anesthesia: Regional | Site: Ankle | Laterality: Left

## 2020-01-31 MED ORDER — IOHEXOL 350 MG/ML SOLN
100.0000 mL | Freq: Once | INTRAVENOUS | Status: AC | PRN
  Administered 2020-01-31: 100 mL via INTRAVENOUS

## 2020-01-31 MED ORDER — PANTOPRAZOLE SODIUM 40 MG PO TBEC
40.0000 mg | DELAYED_RELEASE_TABLET | Freq: Every day | ORAL | Status: DC
  Administered 2020-01-31 – 2020-02-16 (×17): 40 mg via ORAL
  Filled 2020-01-31 (×17): qty 1

## 2020-01-31 MED ORDER — VITAMIN B 12 100 MCG PO LOZG
2500.0000 ug | LOZENGE | Freq: Every day | ORAL | Status: DC
Start: 2020-01-31 — End: 2020-01-31

## 2020-01-31 MED ORDER — METHOCARBAMOL 1000 MG/10ML IJ SOLN: 500.0000 mg | Freq: Four times a day (QID) | INTRAVENOUS | Status: DC | PRN

## 2020-01-31 MED ORDER — MIDAZOLAM HCL 2 MG/2ML IJ SOLN
INTRAMUSCULAR | Status: AC
  Administered 2020-01-31: 13:00:00 1 mg via INTRAVENOUS
  Filled 2020-01-31: qty 2

## 2020-01-31 MED ORDER — CEFAZOLIN SODIUM 1 G IJ SOLR
INTRAMUSCULAR | Status: AC
  Filled 2020-01-31: qty 30

## 2020-01-31 MED ORDER — METOPROLOL TARTRATE 25 MG PO TABS
50.0000 mg | ORAL_TABLET | Freq: Every day | ORAL | Status: DC
  Administered 2020-02-01 – 2020-02-16 (×16): 50 mg via ORAL
  Filled 2020-01-31 (×16): qty 2

## 2020-01-31 MED ORDER — ONDANSETRON 4 MG PO TBDP: 4.0000 mg | ORAL_TABLET | Freq: Three times a day (TID) | ORAL | Status: DC | PRN

## 2020-01-31 MED ORDER — METOPROLOL TARTRATE 5 MG/5ML IV SOLN
INTRAVENOUS | Status: AC
  Filled 2020-01-31: qty 5

## 2020-01-31 MED ORDER — GABAPENTIN 300 MG PO CAPS: 300.0000 mg | ORAL_CAPSULE | Freq: Three times a day (TID) | ORAL | Status: DC

## 2020-01-31 MED ORDER — VANCOMYCIN HCL 1000 MG IV SOLR
INTRAVENOUS | Status: DC | PRN
  Administered 2020-01-31: 1000 mg via TOPICAL

## 2020-01-31 MED ORDER — METHOCARBAMOL 500 MG PO TABS
500.0000 mg | ORAL_TABLET | Freq: Four times a day (QID) | ORAL | Status: DC | PRN
  Administered 2020-02-03 (×2): 500 mg via ORAL
  Filled 2020-01-31 (×3): qty 1

## 2020-01-31 MED ORDER — DICYCLOMINE HCL 20 MG PO TABS
20.0000 mg | ORAL_TABLET | Freq: Two times a day (BID) | ORAL | Status: DC
  Administered 2020-01-31 – 2020-02-16 (×32): 20 mg via ORAL
  Filled 2020-01-31 (×33): qty 1

## 2020-01-31 MED ORDER — LACTATED RINGERS IV SOLN: INTRAVENOUS | Status: DC

## 2020-01-31 MED ORDER — FENTANYL CITRATE (PF) 250 MCG/5ML IJ SOLN
INTRAMUSCULAR | Status: AC
  Filled 2020-01-31: qty 5

## 2020-01-31 MED ORDER — HYDROMORPHONE HCL 1 MG/ML IJ SOLN: 0.5000 mg | INTRAMUSCULAR | Status: DC | PRN

## 2020-01-31 MED ORDER — GABAPENTIN 300 MG PO CAPS
600.0000 mg | ORAL_CAPSULE | Freq: Three times a day (TID) | ORAL | Status: DC
  Administered 2020-01-31 – 2020-02-16 (×47): 600 mg via ORAL
  Filled 2020-01-31 (×2): qty 2
  Filled 2020-01-31: qty 6
  Filled 2020-01-31 (×9): qty 2
  Filled 2020-01-31: qty 6
  Filled 2020-01-31 (×34): qty 2

## 2020-01-31 MED ORDER — OXYCODONE HCL 5 MG/5ML PO SOLN
5.0000 mg | Freq: Once | ORAL | Status: DC | PRN
Start: 2020-01-31 — End: 2020-01-31

## 2020-01-31 MED ORDER — METOPROLOL TARTRATE 25 MG PO TABS
25.0000 mg | ORAL_TABLET | Freq: Every day | ORAL | Status: DC
  Administered 2020-01-31 – 2020-02-15 (×16): 25 mg via ORAL
  Filled 2020-01-31 (×16): qty 1

## 2020-01-31 MED ORDER — LABETALOL HCL 5 MG/ML IV SOLN
10.0000 mg | INTRAVENOUS | Status: AC | PRN
Start: 2020-01-31 — End: 2020-01-31
  Administered 2020-01-31 (×2): 10 mg via INTRAVENOUS
  Filled 2020-01-31 (×2): qty 4

## 2020-01-31 MED ORDER — LABETALOL HCL 5 MG/ML IV SOLN
INTRAVENOUS | Status: AC
  Filled 2020-01-31: qty 4

## 2020-01-31 MED ORDER — BISACODYL 10 MG RE SUPP: 10.0000 mg | Freq: Every day | RECTAL | Status: DC | PRN

## 2020-01-31 MED ORDER — SODIUM CHLORIDE 0.9 % IV SOLN: INTRAVENOUS | Status: AC

## 2020-01-31 MED ORDER — DEXAMETHASONE SODIUM PHOSPHATE 10 MG/ML IJ SOLN
INTRAMUSCULAR | Status: AC
  Filled 2020-01-31: qty 1

## 2020-01-31 MED ORDER — APIXABAN 5 MG PO TABS: 5.0000 mg | ORAL_TABLET | Freq: Two times a day (BID) | ORAL | Status: DC

## 2020-01-31 MED ORDER — SENNOSIDES-DOCUSATE SODIUM 8.6-50 MG PO TABS
1.0000 | ORAL_TABLET | Freq: Every evening | ORAL | Status: DC | PRN
Start: 2020-01-31 — End: 2020-02-16

## 2020-01-31 MED ORDER — NAPROXEN SODIUM 220 MG PO TABS: 440.0000 mg | ORAL_TABLET | Freq: Every day | ORAL | Status: DC | PRN

## 2020-01-31 MED ORDER — HYDROMORPHONE HCL 1 MG/ML IJ SOLN
0.2500 mg | INTRAMUSCULAR | Status: DC | PRN
Start: 2020-01-31 — End: 2020-01-31

## 2020-01-31 MED ORDER — METFORMIN HCL 500 MG PO TABS
500.0000 mg | ORAL_TABLET | Freq: Every day | ORAL | Status: DC
  Administered 2020-02-01: 09:00:00 500 mg via ORAL
  Filled 2020-01-31: qty 1

## 2020-01-31 MED ORDER — POTASSIUM CHLORIDE CRYS ER 20 MEQ PO TBCR
40.0000 meq | EXTENDED_RELEASE_TABLET | Freq: Once | ORAL | Status: AC
  Administered 2020-01-31: 20:00:00 40 meq via ORAL
  Filled 2020-01-31: qty 2

## 2020-01-31 MED ORDER — PHENYLEPHRINE HCL-NACL 10-0.9 MG/250ML-% IV SOLN
INTRAVENOUS | Status: DC | PRN
  Administered 2020-01-31: 30 ug/min via INTRAVENOUS

## 2020-01-31 MED ORDER — FENTANYL CITRATE (PF) 100 MCG/2ML IJ SOLN
50.0000 ug | Freq: Once | INTRAMUSCULAR | Status: AC
  Filled 2020-01-31: qty 1

## 2020-01-31 MED ORDER — CHLORHEXIDINE GLUCONATE 0.12 % MT SOLN: 15.0000 mL | Freq: Once | OROMUCOSAL | Status: AC

## 2020-01-31 MED ORDER — TAMSULOSIN HCL 0.4 MG PO CAPS
0.4000 mg | ORAL_CAPSULE | Freq: Every day | ORAL | Status: DC
  Administered 2020-01-31 – 2020-02-15 (×16): 0.4 mg via ORAL
  Filled 2020-01-31 (×15): qty 1

## 2020-01-31 MED ORDER — DIPHENHYDRAMINE HCL 12.5 MG/5ML PO ELIX
12.5000 mg | ORAL_SOLUTION | ORAL | Status: DC | PRN
Start: 2020-01-31 — End: 2020-02-16

## 2020-01-31 MED ORDER — METOCLOPRAMIDE HCL 10 MG PO TABS
5.0000 mg | ORAL_TABLET | Freq: Three times a day (TID) | ORAL | Status: DC | PRN
  Filled 2020-01-31: qty 1

## 2020-01-31 MED ORDER — LIDOCAINE 2% (20 MG/ML) 5 ML SYRINGE
INTRAMUSCULAR | Status: AC
  Filled 2020-01-31: qty 5

## 2020-01-31 MED ORDER — VITAMIN B-12 1000 MCG PO TABS
2500.0000 ug | ORAL_TABLET | Freq: Every day | ORAL | Status: DC
  Administered 2020-01-31 – 2020-02-10 (×11): 2500 ug via ORAL
  Filled 2020-01-31 (×11): qty 3

## 2020-01-31 MED ORDER — OXYCODONE HCL 5 MG PO TABS: 10.0000 mg | ORAL_TABLET | ORAL | Status: DC | PRN

## 2020-01-31 MED ORDER — PROPOFOL 10 MG/ML IV BOLUS
INTRAVENOUS | Status: DC | PRN
  Administered 2020-01-31: 200 mg via INTRAVENOUS

## 2020-01-31 MED ORDER — LABETALOL HCL 5 MG/ML IV SOLN
INTRAVENOUS | Status: DC | PRN
  Administered 2020-01-31 (×2): 5 mg via INTRAVENOUS

## 2020-01-31 MED ORDER — ONDANSETRON HCL 4 MG/2ML IJ SOLN: 4.0000 mg | Freq: Four times a day (QID) | INTRAMUSCULAR | Status: DC | PRN

## 2020-01-31 MED ORDER — VANCOMYCIN HCL 1000 MG IV SOLR
INTRAVENOUS | Status: AC
  Filled 2020-01-31: qty 1000

## 2020-01-31 MED ORDER — SODIUM CHLORIDE 0.9 % IR SOLN
Status: DC | PRN
  Administered 2020-01-31 (×2): 3000 mL

## 2020-01-31 MED ORDER — DEXAMETHASONE SODIUM PHOSPHATE 4 MG/ML IJ SOLN
INTRAMUSCULAR | Status: DC | PRN
  Administered 2020-01-31: 5 mg via INTRAVENOUS

## 2020-01-31 MED ORDER — DEXAMETHASONE SODIUM PHOSPHATE 10 MG/ML IJ SOLN
INTRAMUSCULAR | Status: DC | PRN
  Administered 2020-01-31: 10 mg

## 2020-01-31 MED ORDER — MIDAZOLAM HCL 5 MG/5ML IJ SOLN
INTRAMUSCULAR | Status: DC | PRN
  Administered 2020-01-31: 2 mg via INTRAVENOUS

## 2020-01-31 MED ORDER — TOBRAMYCIN SULFATE 1.2 G IJ SOLR
INTRAMUSCULAR | Status: AC
  Filled 2020-01-31: qty 1.2

## 2020-01-31 MED ORDER — METOPROLOL TARTRATE 5 MG/5ML IV SOLN
5.0000 mg | Freq: Once | INTRAVENOUS | Status: AC
  Administered 2020-01-31: 17:00:00 5 mg via INTRAVENOUS

## 2020-01-31 MED ORDER — METOPROLOL TARTRATE 5 MG/5ML IV SOLN
INTRAVENOUS | Status: AC
  Filled 2020-01-31: qty 10

## 2020-01-31 MED ORDER — ONDANSETRON HCL 4 MG/2ML IJ SOLN
INTRAMUSCULAR | Status: AC
  Filled 2020-01-31: qty 2

## 2020-01-31 MED ORDER — OXYCODONE HCL 5 MG PO TABS: 5.0000 mg | ORAL_TABLET | Freq: Once | ORAL | Status: DC | PRN

## 2020-01-31 MED ORDER — METOCLOPRAMIDE HCL 5 MG/ML IJ SOLN: 5.0000 mg | Freq: Three times a day (TID) | INTRAMUSCULAR | Status: DC | PRN

## 2020-01-31 MED ORDER — NAPROXEN 250 MG PO TABS: 250.0000 mg | ORAL_TABLET | Freq: Every day | ORAL | Status: DC | PRN

## 2020-01-31 MED ORDER — FENTANYL CITRATE (PF) 100 MCG/2ML IJ SOLN
INTRAMUSCULAR | Status: AC
  Administered 2020-01-31: 13:00:00 50 ug via INTRAVENOUS
  Filled 2020-01-31: qty 2

## 2020-01-31 MED ORDER — DULOXETINE HCL 60 MG PO CPEP
60.0000 mg | ORAL_CAPSULE | Freq: Every day | ORAL | Status: DC
  Administered 2020-01-31 – 2020-02-16 (×17): 60 mg via ORAL
  Filled 2020-01-31 (×17): qty 1

## 2020-01-31 MED ORDER — FENTANYL CITRATE (PF) 100 MCG/2ML IJ SOLN
INTRAMUSCULAR | Status: DC | PRN
  Administered 2020-01-31 (×2): 50 ug via INTRAVENOUS

## 2020-01-31 MED ORDER — METOPROLOL TARTRATE 5 MG/5ML IV SOLN
INTRAVENOUS | Status: DC | PRN
  Administered 2020-01-31: 2 mg via INTRAVENOUS
  Administered 2020-01-31: 3 mg via INTRAVENOUS
  Administered 2020-01-31: 2 mg via INTRAVENOUS
  Administered 2020-01-31: 3 mg via INTRAVENOUS

## 2020-01-31 MED ORDER — 0.9 % SODIUM CHLORIDE (POUR BTL) OPTIME
TOPICAL | Status: DC | PRN
  Administered 2020-01-31: 14:00:00 1000 mL

## 2020-01-31 MED ORDER — PROPOFOL 10 MG/ML IV BOLUS
INTRAVENOUS | Status: AC
  Filled 2020-01-31: qty 40

## 2020-01-31 MED ORDER — MIDAZOLAM HCL 2 MG/2ML IJ SOLN
INTRAMUSCULAR | Status: AC
  Filled 2020-01-31: qty 2

## 2020-01-31 MED ORDER — METOPROLOL TARTRATE 5 MG/5ML IV SOLN
5.0000 mg | Freq: Once | INTRAVENOUS | Status: AC
  Administered 2020-01-31: 21:00:00 5 mg via INTRAVENOUS
  Filled 2020-01-31: qty 5

## 2020-01-31 MED ORDER — DEXTROSE 5 % IV SOLN
INTRAVENOUS | Status: DC | PRN
  Administered 2020-01-31: 14:00:00 3 g via INTRAVENOUS

## 2020-01-31 MED ORDER — METOPROLOL TARTRATE 5 MG/5ML IV SOLN
2.5000 mg | Freq: Four times a day (QID) | INTRAVENOUS | Status: DC | PRN
  Administered 2020-01-31 – 2020-02-05 (×3): 2.5 mg via INTRAVENOUS
  Filled 2020-01-31 (×3): qty 5

## 2020-01-31 MED ORDER — ACETAMINOPHEN 325 MG PO TABS: 325.0000 mg | ORAL_TABLET | Freq: Four times a day (QID) | ORAL | Status: DC | PRN

## 2020-01-31 MED ORDER — PHENYLEPHRINE 40 MCG/ML (10ML) SYRINGE FOR IV PUSH (FOR BLOOD PRESSURE SUPPORT)
PREFILLED_SYRINGE | INTRAVENOUS | Status: AC
  Filled 2020-01-31: qty 10

## 2020-01-31 MED ORDER — CHLORHEXIDINE GLUCONATE 0.12 % MT SOLN
OROMUCOSAL | Status: AC
  Administered 2020-01-31: 12:00:00 15 mL via OROMUCOSAL
  Filled 2020-01-31: qty 15

## 2020-01-31 MED ORDER — CLINDAMYCIN PHOSPHATE 900 MG/50ML IV SOLN
900.0000 mg | Freq: Three times a day (TID) | INTRAVENOUS | Status: DC
  Administered 2020-01-31 (×2): 900 mg via INTRAVENOUS
  Filled 2020-01-31 (×3): qty 50

## 2020-01-31 MED ORDER — ROPIVACAINE HCL 5 MG/ML IJ SOLN
INTRAMUSCULAR | Status: DC | PRN
  Administered 2020-01-31: 40 mL via PERINEURAL

## 2020-01-31 MED ORDER — MIDAZOLAM HCL 2 MG/2ML IJ SOLN
1.0000 mg | Freq: Once | INTRAMUSCULAR | Status: AC
  Filled 2020-01-31: qty 1

## 2020-01-31 MED ORDER — PROMETHAZINE HCL 25 MG/ML IJ SOLN
6.2500 mg | INTRAMUSCULAR | Status: DC | PRN
Start: 2020-01-31 — End: 2020-01-31

## 2020-01-31 MED ORDER — CEFAZOLIN SODIUM-DEXTROSE 2-4 GM/100ML-% IV SOLN
2.0000 g | Freq: Once | INTRAVENOUS | Status: AC
  Administered 2020-01-31: 04:00:00 2 g via INTRAVENOUS
  Filled 2020-01-31: qty 100

## 2020-01-31 MED ORDER — SIMVASTATIN 20 MG PO TABS
20.0000 mg | ORAL_TABLET | Freq: Every day | ORAL | Status: DC
  Administered 2020-01-31 – 2020-02-15 (×16): 20 mg via ORAL
  Filled 2020-01-31 (×16): qty 1

## 2020-01-31 MED ORDER — APIXABAN 5 MG PO TABS
5.0000 mg | ORAL_TABLET | Freq: Two times a day (BID) | ORAL | Status: DC
  Administered 2020-01-31 – 2020-02-16 (×32): 5 mg via ORAL
  Filled 2020-01-31 (×32): qty 1

## 2020-01-31 MED ORDER — ONDANSETRON HCL 4 MG PO TABS: 4.0000 mg | ORAL_TABLET | Freq: Four times a day (QID) | ORAL | Status: DC | PRN

## 2020-01-31 MED ORDER — LIDOCAINE 2% (20 MG/ML) 5 ML SYRINGE
INTRAMUSCULAR | Status: DC | PRN
  Administered 2020-01-31: 40 mg via INTRAVENOUS

## 2020-01-31 MED ORDER — OXYCODONE HCL 5 MG PO TABS: 5.0000 mg | ORAL_TABLET | ORAL | Status: DC | PRN

## 2020-01-31 SURGICAL SUPPLY — 95 items
BANDAGE ESMARK 6X9 LF (GAUZE/BANDAGES/DRESSINGS) ×1 IMPLANT
BIT DRILL 2.4X140 LONG SOLID (BIT) ×2 IMPLANT
BIT DRILL 2.8 (BIT) ×3
BIT DRILL LNG 140X2.8XSLD (BIT) ×1 IMPLANT
BIT DRILL OD SOLI 3.5X110 DISP (DRILL) IMPLANT
BIT DRILL SOLID 2.0 X 110MM (DRILL) IMPLANT
BIT DRL LNG 140X2.8XSLD (BIT) ×2
BNDG CMPR 9X6 STRL LF SNTH (GAUZE/BANDAGES/DRESSINGS)
BNDG COHESIVE 4X5 TAN STRL (GAUZE/BANDAGES/DRESSINGS) ×2 IMPLANT
BNDG ELASTIC 4X5.8 VLCR STR LF (GAUZE/BANDAGES/DRESSINGS) ×1 IMPLANT
BNDG ELASTIC 6X5.8 VLCR STR LF (GAUZE/BANDAGES/DRESSINGS) ×1 IMPLANT
BNDG ESMARK 6X9 LF (GAUZE/BANDAGES/DRESSINGS)
BNDG GAUZE ELAST 4 BULKY (GAUZE/BANDAGES/DRESSINGS) ×2 IMPLANT
BRUSH SCRUB EZ PLAIN DRY (MISCELLANEOUS) ×4 IMPLANT
COVER MAYO STAND STRL (DRAPES) ×2 IMPLANT
COVER SURGICAL LIGHT HANDLE (MISCELLANEOUS) ×6 IMPLANT
CUFF TOURN SGL QUICK 34 (TOURNIQUET CUFF)
CUFF TRNQT CYL 34X4.125X (TOURNIQUET CUFF) ×2 IMPLANT
DRAPE C-ARM 42X72 X-RAY (DRAPES) ×3 IMPLANT
DRAPE C-ARMOR (DRAPES) ×3 IMPLANT
DRAPE EXTREMITY T 121X128X90 (DISPOSABLE) ×1 IMPLANT
DRAPE HALF SHEET 40X57 (DRAPES) ×3 IMPLANT
DRAPE ORTHO SPLIT 77X108 STRL (DRAPES) ×6
DRAPE SURG ORHT 6 SPLT 77X108 (DRAPES) IMPLANT
DRAPE U-SHAPE 47X51 STRL (DRAPES) ×3 IMPLANT
DRILL OD SOLID 3.5X110 DISP (DRILL) ×3
DRILL SOLID 2.0 X 110MM (DRILL) ×3
DRSG ADAPTIC 3X8 NADH LF (GAUZE/BANDAGES/DRESSINGS) ×1 IMPLANT
DRSG EMULSION OIL 3X3 NADH (GAUZE/BANDAGES/DRESSINGS) IMPLANT
DRSG MEPITEL 4X7.2 (GAUZE/BANDAGES/DRESSINGS) ×2 IMPLANT
DRSG PAD ABDOMINAL 8X10 ST (GAUZE/BANDAGES/DRESSINGS) ×4 IMPLANT
ELECT REM PT RETURN 9FT ADLT (ELECTROSURGICAL) ×3
ELECTRODE REM PT RTRN 9FT ADLT (ELECTROSURGICAL) ×2 IMPLANT
GAUZE SPONGE 4X4 12PLY STRL (GAUZE/BANDAGES/DRESSINGS) ×4 IMPLANT
GLOVE BIO SURGEON STRL SZ7.5 (GLOVE) ×1 IMPLANT
GLOVE BIO SURGEON STRL SZ8 (GLOVE) ×4 IMPLANT
GLOVE BIOGEL PI IND STRL 7.5 (GLOVE) ×1 IMPLANT
GLOVE BIOGEL PI IND STRL 8 (GLOVE) ×4 IMPLANT
GLOVE BIOGEL PI IND STRL 9 (GLOVE) ×2 IMPLANT
GLOVE BIOGEL PI INDICATOR 7.5 (GLOVE)
GLOVE BIOGEL PI INDICATOR 8 (GLOVE) ×2
GLOVE BIOGEL PI INDICATOR 9 (GLOVE) ×1
GOWN STRL REUS W/ TWL LRG LVL3 (GOWN DISPOSABLE) ×4 IMPLANT
GOWN STRL REUS W/ TWL XL LVL3 (GOWN DISPOSABLE) ×2 IMPLANT
GOWN STRL REUS W/TWL LRG LVL3 (GOWN DISPOSABLE) ×6
GOWN STRL REUS W/TWL XL LVL3 (GOWN DISPOSABLE) ×3
HANDPIECE INTERPULSE COAX TIP (DISPOSABLE) ×3
K-WIRE SMOOTH TROCAR 2.0X150 (WIRE) ×3
KIT BASIN OR (CUSTOM PROCEDURE TRAY) ×5 IMPLANT
KIT TURNOVER KIT B (KITS) ×3 IMPLANT
KWIRE SMOOTH TROCAR 2.0X150 (WIRE) IMPLANT
MANIFOLD NEPTUNE II (INSTRUMENTS) ×3 IMPLANT
MAT PREVALON FULL STRYKER (MISCELLANEOUS) ×3 IMPLANT
NDL HYPO 21X1.5 SAFETY (NEEDLE) IMPLANT
NEEDLE HYPO 21X1.5 SAFETY (NEEDLE) IMPLANT
NS IRRIG 1000ML POUR BTL (IV SOLUTION) ×3 IMPLANT
PACK GENERAL/GYN (CUSTOM PROCEDURE TRAY) ×3 IMPLANT
PACK ORTHO EXTREMITY (CUSTOM PROCEDURE TRAY) ×2 IMPLANT
PACK UNIVERSAL I (CUSTOM PROCEDURE TRAY) ×2 IMPLANT
PAD ARMBOARD 7.5X6 YLW CONV (MISCELLANEOUS) ×6 IMPLANT
PAD CAST 4YDX4 CTTN HI CHSV (CAST SUPPLIES) ×1 IMPLANT
PADDING CAST COTTON 4X4 STRL (CAST SUPPLIES) ×3
PADDING CAST COTTON 6X4 STRL (CAST SUPPLIES) ×3 IMPLANT
PLATE FIBULAR CL 11H LT (Plate) ×2 IMPLANT
SCREW 3.5X22 (Screw) ×1 IMPLANT
SCREW LOCK PLATE R3 2.7X13 (Screw) ×2 IMPLANT
SCREW LOCK PLATE R3 2.7X17 (Screw) ×2 IMPLANT
SCREW LOCK PLATE R3 2.7X19 (Screw) ×2 IMPLANT
SCREW LOCK PLATE R3 3.5X12 (Screw) ×2 IMPLANT
SCREW NL R3CON 4.2X50 (Screw) ×4 IMPLANT
SCREW NON LOCKING 2.7X20 (Screw) ×1 IMPLANT
SCREW NON LOCKING 3.5X12 (Screw) ×2 IMPLANT
SET HNDPC FAN SPRY TIP SCT (DISPOSABLE) ×1 IMPLANT
SOAP 2 % CHG 4 OZ (WOUND CARE) ×2 IMPLANT
SOL PREP POV-IOD 4OZ 10% (MISCELLANEOUS) ×3 IMPLANT
SOL PREP PROV IODINE SCRUB 4OZ (MISCELLANEOUS) ×3 IMPLANT
SPLINT PLASTER CAST XFAST 5X30 (CAST SUPPLIES) ×1 IMPLANT
SPLINT PLASTER XFAST SET 5X30 (CAST SUPPLIES) ×1
SPONGE LAP 18X18 RF (DISPOSABLE) ×2 IMPLANT
STAPLER VISISTAT 35W (STAPLE) ×1 IMPLANT
STOCKINETTE IMPERVIOUS 9X36 MD (GAUZE/BANDAGES/DRESSINGS) IMPLANT
SUCTION FRAZIER HANDLE 10FR (MISCELLANEOUS) ×3
SUCTION TUBE FRAZIER 10FR DISP (MISCELLANEOUS) ×2 IMPLANT
SUT ETHILON 2 0 FS 18 (SUTURE) ×10 IMPLANT
SUT ETHILON 3 0 PS 1 (SUTURE) ×4 IMPLANT
SUT PDS AB 2-0 CT1 27 (SUTURE) ×1 IMPLANT
SUT VIC AB 2-0 CT1 27 (SUTURE) ×6
SUT VIC AB 2-0 CT1 TAPERPNT 27 (SUTURE) ×4 IMPLANT
TOWEL GREEN STERILE (TOWEL DISPOSABLE) ×6 IMPLANT
TOWEL GREEN STERILE FF (TOWEL DISPOSABLE) ×3 IMPLANT
TRAY CATH 16FR W/PLASTIC CATH (SET/KITS/TRAYS/PACK) ×1 IMPLANT
TUBE CONNECTING 12X1/4 (SUCTIONS) ×1 IMPLANT
UNDERPAD 30X36 HEAVY ABSORB (UNDERPADS AND DIAPERS) ×3 IMPLANT
WATER STERILE IRR 1000ML POUR (IV SOLUTION) ×3 IMPLANT
YANKAUER SUCT BULB TIP NO VENT (SUCTIONS) ×3 IMPLANT

## 2020-01-31 NOTE — H&P (Signed)
History and Physical    David Fitzgerald:[id number] DOB: 1960-02-21 DOA: 01/31/2020  PCP: Noni Saupe, MD (Confirm with patient/family/NH records and if not entered, this has to be entered at [facility] point of entry) Patient coming from: Home  I have personally briefly reviewed patient's old medical records in [facility]  Chief Complaint: I feel OK  HPI: David Fitzgerald is a 59 y.o. male with medical history significant of paroxysmal A. fib on Eliquis, IIDM, diabetic neuropathy, morbid obesity, BPH, venous insufficiency on as needed Lasix, presented with mechanical fall and left ankle fracture.  Patient woke up at night, and tripped and fell on the left leg and ankle, he said he was a little unsteady but denied any prodromes of lightheadedness chest pain or shortness of breath.  Sustained a open fracture of left ankle and came to ED. ED Course: X-ray showed acute left malleolus fracture with mild dislocation of left ankle.  Patient shifted to the OR to have ORIF of left ankle.  In PACU, patient developed in and out uncontrolled A. fib, responded to IV Lopressor.  Blood pressure stable, elevated lactic acid 2.6, WBC WNL.  Review of Systems: As per HPI otherwise 14 point review of systems negative.    Past Medical History:  Diagnosis Date  . Allergy to pollen 04/06/2015  . Anxiety 02/06/2017  . Arthritis 02/06/2017  . Bee sting allergy    wasp / mixed vespid  . Bell's palsy 02/24/2013  . Chronic venous insufficiency 12/14/2014  . Diabetes (HCC)   . Edema 12/14/2014  . Hyperlipidemia, mixed 12/14/2014  . Hypertension   . Lumbar pain with radiation down left leg 07/09/2012  . Morbid obesity (HCC) 12/14/2014  . Morbid obesity with BMI of 45.0-49.9, adult (HCC) 07/09/2012  . Obesity hypoventilation syndrome (HCC) 12/14/2014  . OSA treated with BiPAP 12/14/2014  . Pain syndrome, chronic 07/09/2012  . Pernicious anemia 02/06/2017  . 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 Syndrome  affecting cervical region 02/24/2013  . Trigeminal neuralgia 02/24/2013  . Type 2 diabetes mellitus with diabetic neuropathy (HCC) 12/14/2014    Past Surgical History:  Procedure Laterality Date  . ADENOIDECTOMY    . BACK SURGERY  2002 /2003  . CARPAL TUNNEL RELEASE Bilateral   . KNEE SURGERY Left 2005  . SHOULDER SURGERY Left 1998  . TONSILLECTOMY       reports that he quit smoking about 13 years ago. His smoking use included cigarettes. He has a 105.00 pack-year smoking history. He has never used smokeless tobacco. He reports that he does not drink alcohol and does not use drugs.  Allergies  Allergen Reactions  . Penicillins Swelling  . Aspirin     Relative contraindication due to hematochezia while on it  . Bee Venom     Family History  Problem Relation Age of Onset  . Breast cancer Mother   . Heart disease Father   . CAD Father   . Atrial fibrillation Father   . Stomach cancer Paternal Aunt   . Heart disease Paternal Uncle   . Breast cancer Maternal Grandmother      Prior to Admission medications   Medication Sig Start Date End Date Taking? Authorizing Provider  Cyanocobalamin (VITAMIN B 12 PO) Take 2,500 mcg by mouth daily.   Yes [provider]  dicyclomine (BENTYL) 20 MG tablet Take 1 tablet by mouth 2 (two) times daily. 11/27/17  Yes [provider]  DULoxetine (CYMBALTA) 60 MG capsule  Take 60 mg by mouth daily.  01/29/17  Yes [provider]  ELIQUIS 5 MG TABS tablet TAKE 1 TABLET BY MOUTH TWICE DAILY Patient taking differently: Take 5 mg by mouth 2 (two) times daily. 10/26/19  Yes Baldo DaubMunley, Brian J, MD  EPINEPHrine 0.3 mg/0.3 mL IJ SOAJ injection Use for life-threatening allergic reactions 07/23/18  Yes Kozlow, Alvira PhilipsEric J, MD  furosemide (LASIX) 40 MG tablet Take 40 mg by mouth daily as needed for fluid or edema.   Yes [provider]  gabapentin (NEURONTIN) 300 MG capsule Take 600 mg by mouth 3 (three) times daily.    Yes [provider]  metFORMIN (GLUCOPHAGE) 500 MG tablet Take 500 mg by mouth daily with breakfast.   Yes [provider]  metoprolol tartrate (LOPRESSOR) 25 MG tablet Take 1 tablet (25 mg total) by mouth 3 (three) times daily. Patient taking differently: Take 25-50 mg by mouth See admin instructions. 50 mg in the am and 25mg   at bedtime. 03/07/17  Yes Baldo DaubMunley, Brian J, MD  MIXED VESPID VENOM PROTEIN [facility] [place] Inject into the skin.   Yes [provider]  naloxone (NARCAN) nasal spray 4 mg/0.1 mL Place 1 spray into the nose See admin instructions. USE 1 SPRAY NASALLY AS NEEDED FOR OPIOID OVERDOSE EMERGENCY   Yes [provider]  naproxen sodium (ALEVE) 220 MG tablet Take 440 mg by mouth daily as needed (headache).   Yes [provider]  nitroGLYCERIN (NITROSTAT) 0.4 MG SL tablet Place 0.4 mg under the tongue every 5 (five) minutes as needed. Reported on 04/26/2015 02/01/14  Yes [provider]  ondansetron (ZOFRAN-ODT) 4 MG disintegrating tablet Take 4 mg by mouth every 8 (eight) hours as needed for nausea or vomiting.   Yes [provider]  oxyCODONE-acetaminophen (PERCOCET) 7.5-325 MG tablet Take 1 tablet by mouth every 8 (eight) hours as needed for severe pain.   Yes [provider]  pantoprazole (PROTONIX) 40 MG tablet Take 40 mg by mouth daily. 08/31/19  Yes [provider]  simvastatin (ZOCOR) 20 MG tablet Take 20 mg by mouth at bedtime.   Yes [provider]  tamsulosin (FLOMAX) 0.4 MG CAPS capsule Take 0.4 mg by mouth at bedtime.   Yes [provider]  VENOMIL WASP VENOM IJ Inject as directed.   Yes [provider]    Physical Exam: Vitals:   01/31/20 1715 01/31/20 1745 01/31/20 1800 01/31/20 1826  BP: 123/77 128/89 128/89 (!) 155/97  Pulse: (!) 135 (!) 116 (!) 120 92  Resp: 11 18 16 16   Temp:   98.7 F (37.1 C) 98.3 F (36.8 C)  TempSrc:      SpO2: 92% 92% 92% 93%  Weight:      Height:         Constitutional: NAD, calm, comfortable Vitals:   01/31/20 1715 01/31/20 1745 01/31/20 1800 01/31/20 1826  BP: 123/77 128/89 128/89 (!) 155/97  Pulse: (!) 135 (!) 116 (!) 120 92  Resp: 11 18 16 16   Temp:   98.7 F (37.1 C) 98.3 F (36.8 C)  TempSrc:      SpO2: 92% 92% 92% 93%  Weight:      Height:       Eyes: PERRL, lids and conjunctivae normal ENMT: Mucous membranes are dry. Posterior pharynx clear of any exudate or lesions.Normal dentition.  Neck: normal, supple, no masses, no thyromegaly Respiratory: clear to auscultation bilaterally, no wheezing, no crackles. Normal respiratory effort. No accessory muscle use.  Cardiovascular: Irregular heart rate, no murmurs / rubs / gallops. No extremity edema. 2+ pedal pulses. No carotid bruits.  Abdomen: no tenderness, no masses palpated. No hepatosplenomegaly. Bowel sounds positive.  Musculoskeletal: no clubbing / cyanosis. No joint deformity upper and lower extremities. Good ROM, no contractures. Normal muscle tone.  Skin: Left ankle in surgical dressing Neurologic: CN 2-12 grossly intact. Sensation intact, DTR normal. Strength 5/5 in all 4.  Psychiatric: Normal judgment and insight. Alert and oriented x 3. Normal mood.     Labs on Admission: I have personally reviewed following labs and imaging studies  CBC: Recent Labs  Lab 01/31/20 0320 01/31/20 0329  WBC 9.2  --   HGB 14.6 15.0  HCT 43.6 44.0  MCV 92.2  --   PLT 213  --    Basic Metabolic Panel: Recent Labs  Lab 01/31/20 0320 01/31/20 0329  NA 137 138  K 3.8 3.8  CL 100 99  CO2 26  --   GLUCOSE 165* 155*  BUN 11 13  CREATININE 1.12 1.00  CALCIUM 8.9  --    GFR: Estimated Creatinine Clearance: 130.3 mL/min (by C-G formula based on SCr of 1 mg/dL). Liver Function Tests: Recent Labs  Lab 01/31/20 0320  AST 29  ALT 24  ALKPHOS 66  BILITOT 0.7  PROT 7.2  ALBUMIN 3.4*   No results for input(s): LIPASE, AMYLASE in the last 168 hours. No results for  input(s): AMMONIA in the last 168 hours. Coagulation Profile: Recent Labs  Lab 01/31/20 0320  INR 1.1   Cardiac Enzymes: No results for input(s): CKTOTAL, CKMB, CKMBINDEX, TROPONINI in the last 168 hours. BNP (last 3 results) No results for input(s): PROBNP in the last 8760 hours. HbA1C: No results for input(s): HGBA1C in the last 72 hours. CBG: Recent Labs  Lab 01/31/20 1147 01/31/20 1643  GLUCAP 121* 173*   Lipid Profile: No results for input(s): CHOL, HDL, LDLCALC, TRIG, CHOLHDL, LDLDIRECT in the last 72 hours. Thyroid Function Tests: No results for input(s): TSH, T4TOTAL, FREET4, T3FREE, THYROIDAB in the last 72 hours. Anemia Panel: No results for input(s): VITAMINB12, FOLATE, FERRITIN, TIBC, IRON, RETICCTPCT in the last 72 hours. Urine analysis: No results found for: COLORURINE, APPEARANCEUR, LABSPEC, PHURINE, GLUCOSEU, HGBUR, BILIRUBINUR, KETONESUR, PROTEINUR, UROBILINOGEN, NITRITE, LEUKOCYTESUR  Radiological Exams on Admission: DG Ankle 2 Views Left  Result Date: 01/31/2020 CLINICAL DATA:  Left ankle ORIF. EXAM: LEFT ANKLE - 2 VIEW; DG C-ARM 1-60 MIN COMPARISON:  01/31/2020. FINDINGS: Fluoro time: 1 minutes and 19 seconds. Ten C-arm fluoroscopic images were obtained intraoperatively and submitted for post operative interpretation. These images demonstrate ORIF of a lateral malleolar fracture with plate and screws. Please see the performing provider's procedural report for further detail. IMPRESSION: Intraoperative fluoroscopic imaging, as described above. Electronically Signed   By: Feliberto Harts MD   On: 01/31/2020 15:51   DG Ankle 2 Views Left  Result Date: 01/31/2020 CLINICAL DATA:  Status post trauma. EXAM: LEFT ANKLE - 2 VIEW COMPARISON:  January 25, 2008 FINDINGS: The left ankle was imaged in a fiberglass cast with partially obscured osseous and soft tissue detail. An acute fracture deformity is seen extending through the left lateral malleolus. Approximately 1  shaft width lateral displacement of the distal fracture site is seen. A 1.0 cm cortical density of indeterminate age is seen adjacent to the dorsal aspect of the distal left talus. There is mild lateral dislocation of the left ankle. Moderate severity soft tissue swelling is seen, most prominent along  the medial aspect of the left ankle. Mild to moderate severity anterior lateral soft tissue air is also seen. IMPRESSION: 1. Acute fracture of the left lateral malleolus. 2. Mild lateral dislocation of the left ankle. 3. Findings likely consistent with a small fracture of indeterminate age originating from the distal left talus. Electronically Signed   By: Aram Candela M.D.   On: 01/31/2020 03:35   CT Head Wo Contrast  Result Date: 01/31/2020 CLINICAL DATA:  Poly trauma. EXAM: CT HEAD WITHOUT CONTRAST TECHNIQUE: Contiguous axial images were obtained from the base of the skull through the vertex without intravenous contrast. COMPARISON:  CT head without contrast 12/16/2011 FINDINGS: Brain: Mild diffuse white matter disease is present. No acute infarct, hemorrhage, or mass lesion is present. Basal ganglia are intact. No acute or focal cortical abnormalities are present. No significant extraaxial fluid collection is present. The ventricles are of normal size. The brainstem and cerebellum are within normal limits. Vascular: Atherosclerotic calcifications are present within the cavernous internal carotid arteries bilaterally. No hyperdense vessel is present. Skull: No significant extracranial soft tissue injury is present. Calvarium is intact. No focal lytic or blastic lesions are present. Sinuses/Orbits: Chronic opacification of the left maxillary sinus noted. Scattered mucosal thickening is present throughout the anterior ethmoid air cells and inferior frontal sinuses bilaterally. Posterior ethmoid air cells and sphenoid sinuses are clear. The mastoid air cells are clear. The globes and orbits are within normal  limits. IMPRESSION: 1. No acute intracranial abnormality or significant interval change. 2. Mild diffuse white matter disease likely reflects the sequela of chronic microvascular ischemia. 3. Chronic left maxillary sinus disease. Electronically Signed   By: Marin Roberts M.D.   On: 01/31/2020 03:58   CT ANGIO LOW EXTREM LEFT W &/OR WO CONTRAST  Addendum Date: 01/31/2020   ADDENDUM REPORT: 01/31/2020 04:54 ADDENDUM: Salient findings discussed by telephone with Dr. Dione Booze on 01/31/2020 at 0445 hours. Electronically Signed   By: Odessa Fleming M.D.   On: 01/31/2020 04:54   Result Date: 01/31/2020 CLINICAL DATA:  59 year old male status post fall at 0130 hours. On Eliquis. Compound fracture left ankle with visible bone. EXAM: CT ANGIOGRAPHY OF ILIOFEMORAL RUNOFF, CT ANGIOGRAPHY LOWER LEFT EXTREMITY, CT ANGIOGRAPHY LOWER RIGHT EXTREMITY TECHNIQUE: Multidetector CT imaging of the abdomen, pelvis and lower extremities was performed using the standard protocol during bolus administration of intravenous contrast. Multiplanar CT image reconstructions and MIPs were obtained to evaluate the vascular anatomy. CONTRAST:  OMNIPAQUE IOHEXOL 350 MG/ML SOLN COMPARISON:  Left ankle radiographs 0319 hours. [facility] CT Abdomen and Pelvis 04/27/2019. FINDINGS: VASCULAR Aorta: Patent distal aorta with atherosclerosis. IMA origin is included and patent. Iliac arteries: Patent bilateral iliac arteries with calcified atherosclerosis. RIGHT Lower Extremity Patent right femoral arteries with mild calcified atherosclerosis. No hemodynamically significant stenosis. Normal right popliteal artery. Runoff: Three-vessel, with no atherosclerosis or stenosis evident. LEFT Lower Extremity Patent left femoral arteries with mild atherosclerosis, most apparent in the distal left SFA (calcified plaque series 5, image 169). No femoral artery stenosis identified. Patent left popliteal artery with mild soft plaque versus mixing  artifact on series 5, image 219. No significant stenosis. Runoff: 3 vessel left lower extremity runoff with mild calcified plaque. No discrete anterior or posterior tibial artery injury is identified. Symmetric enhancement of the dorsalis pedis and distal posterior tibial arteries. However, possible small foci of contrast extravasation within hematoma located just posterior and superior to the medial malleolus on series 5, image 357. Additional punctate bone fragments in  this region. Regional subcutaneous hematoma measures 3-4 cm. See left ankle fracture details below. Review of the MIP images confirms the above findings. NON-VASCULAR Hepatobiliary: Minimally included right lower liver tip, negative. Pancreas: Minimally included uncinate, negative. Spleen: Not included. Adrenals/Urinary Tract: Visible kidneys appears stable since March and negative. No hydroureter. Diminutive, unremarkable urinary bladder. Stomach/Bowel: Nondilated visible large and small bowel. Normal retrocecal appendix (series 5, image 22). Diverticulosis of the visible right colon. No free air, free fluid or mesenteric stranding is visible. Lymphatic: No lymphadenopathy in the lower abdomen or pelvis. Reproductive: Negative. Other: No pelvic free fluid. Musculoskeletal: Chronic lower lumbar decompression and fusion. No acute pelvic fracture identified. Both femurs appear intact. Evidence of comminuted but nondisplaced fracture of the lower left patella (series 9, image 64 and series 5, image 229. Small superimposed left knee joint effusion with fairly simple fluid density (series 5, image 216). Distal femur, proximal left tibia and proximal left fibula appear intact. Contralateral right knee appears intact. Right tib fib, right ankle, and right foot appear intact. Oblique mildly comminuted fracture distal left fibula metadiaphysis. Mildly comminuted fracture of the distal left tibia posterior malleolus. Those fractures are minimally to mildly  displaced. Additional avulsion at the tip of the medial malleolus (series 9, image 104). Acute versus chronic fracture fragments about the neck of the talus with gas in the talonavicular joint. Talar dome appears intact. Chronic appearing fracture deformity of the left navicular adjacent to the joint space gas. Left calcaneus appears intact. No definite acute fracture elsewhere in the left foot. Soft tissue hematoma and gas bilaterally about the left ankle. Soft tissue gas tracks cephalad in the left tib fib toward the tibial tuberosity. The most confluent hematoma is posterior and overlying the medial malleolus. IMPRESSION: VASCULAR 1. No discrete lower extremity arterial injury is identified, but small foci of contrast extravasation are suspected within hematoma located superior to the left medial malleolus (series 5, image 357). 2. Otherwise symmetric bilateral lower extremity three-vessel runoff with mild atherosclerosis and no significant stenosis. 3. Mild distal aorta and bilateral iliofemoral artery atherosclerosis without significant stenosis. NON-VASCULAR 1. Comminuted acute fractures of the distal left fibula, posterior malleolus. More age indeterminate fracture fragments at the medial malleolus, neck of the talus. Associated soft tissue hematoma, and subcutaneous gas tracking up the left leg. 2. Comminuted minimally displaced fracture of the left patella. Small left knee joint effusion. 3. Stable, negative visible lower abdomen and pelvis. Electronically Signed: By: Odessa Fleming M.D. On: 01/31/2020 04:44   CT ANGIO LOW EXTREM RIGHT W &/OR WO CONTRAST  Addendum Date: 01/31/2020   ADDENDUM REPORT: 01/31/2020 04:54 ADDENDUM: Salient findings discussed by telephone with Dr. Dione Booze on 01/31/2020 at 0445 hours. Electronically Signed   By: Odessa Fleming M.D.   On: 01/31/2020 04:54   Result Date: 01/31/2020 CLINICAL DATA:  59 year old male status post fall at 0130 hours. On Eliquis. Compound fracture left ankle  with visible bone. EXAM: CT ANGIOGRAPHY OF ILIOFEMORAL RUNOFF, CT ANGIOGRAPHY LOWER LEFT EXTREMITY, CT ANGIOGRAPHY LOWER RIGHT EXTREMITY TECHNIQUE: Multidetector CT imaging of the abdomen, pelvis and lower extremities was performed using the standard protocol during bolus administration of intravenous contrast. Multiplanar CT image reconstructions and MIPs were obtained to evaluate the vascular anatomy. CONTRAST:  OMNIPAQUE IOHEXOL 350 MG/ML SOLN COMPARISON:  Left ankle radiographs 0319 hours. [facility] CT Abdomen and Pelvis 04/27/2019. FINDINGS: VASCULAR Aorta: Patent distal aorta with atherosclerosis. IMA origin is included and patent. Iliac arteries: Patent bilateral iliac arteries with  calcified atherosclerosis. RIGHT Lower Extremity Patent right femoral arteries with mild calcified atherosclerosis. No hemodynamically significant stenosis. Normal right popliteal artery. Runoff: Three-vessel, with no atherosclerosis or stenosis evident. LEFT Lower Extremity Patent left femoral arteries with mild atherosclerosis, most apparent in the distal left SFA (calcified plaque series 5, image 169). No femoral artery stenosis identified. Patent left popliteal artery with mild soft plaque versus mixing artifact on series 5, image 219. No significant stenosis. Runoff: 3 vessel left lower extremity runoff with mild calcified plaque. No discrete anterior or posterior tibial artery injury is identified. Symmetric enhancement of the dorsalis pedis and distal posterior tibial arteries. However, possible small foci of contrast extravasation within hematoma located just posterior and superior to the medial malleolus on series 5, image 357. Additional punctate bone fragments in this region. Regional subcutaneous hematoma measures 3-4 cm. See left ankle fracture details below. Review of the MIP images confirms the above findings. NON-VASCULAR Hepatobiliary: Minimally included right lower liver tip, negative. Pancreas:  Minimally included uncinate, negative. Spleen: Not included. Adrenals/Urinary Tract: Visible kidneys appears stable since March and negative. No hydroureter. Diminutive, unremarkable urinary bladder. Stomach/Bowel: Nondilated visible large and small bowel. Normal retrocecal appendix (series 5, image 22). Diverticulosis of the visible right colon. No free air, free fluid or mesenteric stranding is visible. Lymphatic: No lymphadenopathy in the lower abdomen or pelvis. Reproductive: Negative. Other: No pelvic free fluid. Musculoskeletal: Chronic lower lumbar decompression and fusion. No acute pelvic fracture identified. Both femurs appear intact. Evidence of comminuted but nondisplaced fracture of the lower left patella (series 9, image 64 and series 5, image 229. Small superimposed left knee joint effusion with fairly simple fluid density (series 5, image 216). Distal femur, proximal left tibia and proximal left fibula appear intact. Contralateral right knee appears intact. Right tib fib, right ankle, and right foot appear intact. Oblique mildly comminuted fracture distal left fibula metadiaphysis. Mildly comminuted fracture of the distal left tibia posterior malleolus. Those fractures are minimally to mildly displaced. Additional avulsion at the tip of the medial malleolus (series 9, image 104). Acute versus chronic fracture fragments about the neck of the talus with gas in the talonavicular joint. Talar dome appears intact. Chronic appearing fracture deformity of the left navicular adjacent to the joint space gas. Left calcaneus appears intact. No definite acute fracture elsewhere in the left foot. Soft tissue hematoma and gas bilaterally about the left ankle. Soft tissue gas tracks cephalad in the left tib fib toward the tibial tuberosity. The most confluent hematoma is posterior and overlying the medial malleolus. IMPRESSION: VASCULAR 1. No discrete lower extremity arterial injury is identified, but small foci of  contrast extravasation are suspected within hematoma located superior to the left medial malleolus (series 5, image 357). 2. Otherwise symmetric bilateral lower extremity three-vessel runoff with mild atherosclerosis and no significant stenosis. 3. Mild distal aorta and bilateral iliofemoral artery atherosclerosis without significant stenosis. NON-VASCULAR 1. Comminuted acute fractures of the distal left fibula, posterior malleolus. More age indeterminate fracture fragments at the medial malleolus, neck of the talus. Associated soft tissue hematoma, and subcutaneous gas tracking up the left leg. 2. Comminuted minimally displaced fracture of the left patella. Small left knee joint effusion. 3. Stable, negative visible lower abdomen and pelvis. Electronically Signed: By: Genevie Ann M.D. On: 01/31/2020 04:44   DG Chest Port 1 View  Result Date: 01/31/2020 CLINICAL DATA:  Preoperative respiratory exam EXAM: PORTABLE CHEST 1 VIEW COMPARISON:  CT 02/14/2017, radiograph 03/25/2016 FINDINGS: There are some chronically coarsened  interstitial and bronchitic features though increased hazy interstitial opacities are present towards the lung bases with slight pulmonary vascular congestion. While these may be partially attributable to body habitus some early interstitial edema or atelectasis could present similarly in the absence of infectious symptoms. The cardiomediastinal contours are fairly prominent though may be accentuated by low volumes and the portable technique. No pneumothorax. No effusion. Telemetry leads overlie the chest. No acute osseous or soft tissue abnormality. IMPRESSION: Hazy interstitial opacities towards the lung bases with slight pulmonary vascular congestion. While these may be partially attributable to body habitus could reflect some mild interstitial edema or atelectasis in the absence of infectious symptoms. Electronically Signed   By: Kreg Shropshire M.D.   On: 01/31/2020 03:51   DG C-Arm 1-60  Min  Result Date: 01/31/2020 CLINICAL DATA:  Left ankle ORIF. EXAM: LEFT ANKLE - 2 VIEW; DG C-ARM 1-60 MIN COMPARISON:  01/31/2020. FINDINGS: Fluoro time: 1 minutes and 19 seconds. Ten C-arm fluoroscopic images were obtained intraoperatively and submitted for post operative interpretation. These images demonstrate ORIF of a lateral malleolar fracture with plate and screws. Please see the performing provider's procedural report for further detail. IMPRESSION: Intraoperative fluoroscopic imaging, as described above. Electronically Signed   By: Feliberto Harts MD   On: 01/31/2020 15:51    EKG: Independently reviewed.  Rate controlled A. fib  Assessment/Plan Active Problems:   Open left ankle fracture   Ankle fracture  (please populate well all problems here in Problem List. (For example, if patient is on BP meds at home and you resume or decide to hold them, it is a problem that needs to be her. Same for CAD, COPD, HLD and so on)  A. fib with RVR -Patient appears to be hypovolemic, will give a short course 12 hours IV fluid to explant volume -Rate control wise, responded to IV metoprolol in ED and PACU, discussed with surgeon and PACU nurse, patient appears to be stable to swallow and eat, will resume home metoprolol.  -PRN IV metoprolol -Surgeon agreed to restart Eliquis tonight -Make K>4, and check magnesium level  Open fracture of left ankle -Status post ORIF -Antibiotics and postop care as per surgery team  OSA -CPAP HS  IIDM -Resume metformin  DM neuropathy -Resume gabapentin    DVT prophylaxis: Eliquis  code Status: Full Code Family Communication: *None at bedside Disposition Plan: Expect 1 to 2 days hospital stay Consults called: Orthopedic surgery Admission status: MedSurg   Emeline General MD [facility] Pager [phone]  01/31/2020, 6:46 PM

## 2020-01-31 NOTE — Progress Notes (Signed)
Orthopedic Tech Progress Note Patient Details:  David Fitzgerald 02-23-1960 [id number]  Ortho Devices Type of Ortho Device: Post (short leg) splint,Stirrup splint Ortho Device/Splint Location: lle Ortho Device/Splint Interventions: Ordered,Application,Adjustment   Post Interventions Patient Tolerated: Well Instructions Provided: Care of device,Adjustment of device   [name] 01/31/2020, 4:15 AM

## 2020-01-31 NOTE — Anesthesia Procedure Notes (Signed)
Procedure Name: LMA Insertion Date/Time: 01/31/2020 1:17 PM Performed by: [name]., CRNA Pre-anesthesia Checklist: Patient identified, Emergency Drugs available, Suction available, Patient being monitored and Timeout performed Patient Re-evaluated:Patient Re-evaluated prior to induction Oxygen Delivery Method: Circle system utilized Preoxygenation: Pre-oxygenation with 100% oxygen Induction Type: IV induction LMA: LMA inserted LMA Size: 5.0 Number of attempts: 1 Placement Confirmation: positive ETCO2 and breath sounds checked- equal and bilateral Tube secured with: Tape Dental Injury: Teeth and Oropharynx as per pre-operative assessment

## 2020-01-31 NOTE — Anesthesia Procedure Notes (Signed)
Anesthesia Regional Block: Adductor canal block   Pre-Anesthetic Checklist: ,, timeout performed, Correct Patient, Correct Site, Correct Laterality, Correct Procedure, Correct Position, site marked, Risks and benefits discussed,  Surgical consent,  Pre-op evaluation,  At surgeon's request and post-op pain management  Laterality: Left  Prep: Maximum Sterile Barrier Precautions used, chloraprep       Needles:  Injection technique: Single-shot  Needle Type: Echogenic Stimulator Needle     Needle Length: 9cm  Needle Gauge: 22     Additional Needles:   Procedures:,,,, ultrasound used (permanent image in chart),,,,  Narrative:  Start time: 01/31/2020 12:55 PM End time: 01/31/2020 1:00 PM Injection made incrementally with aspirations every 5 mL.  Performed by: Personally  Anesthesiologist: Lannie Fields, DO  Additional Notes: Monitors applied. No increased pain on injection. No increased resistance to injection. Injection made in 5cc increments. Good needle visualization. Patient tolerated procedure well.

## 2020-01-31 NOTE — ED Provider Notes (Signed)
[facility] EMERGENCY DEPARTMENT Provider Note   CSN: [id number] Arrival date & time: 01/31/20  0255   History Chief Complaint  Patient presents with  . Fall    David Fitzgerald is a 59 y.o. male.  The history is provided by the patient.  Fall  He has history of hypertension, hyperlipidemia, diabetes, peripheral neuropathy, atrial fibrillation anticoagulated on apixaban, chronic venous insufficiency, morbid obesity and comes in after falling at home suffering an injury to his left ankle.  He denies head injury or loss of consciousness.  He is up-to-date on tetanus immunizations.  Past Medical History:  Diagnosis Date  . Allergy to pollen 04/06/2015  . Anxiety 02/06/2017  . Arthritis 02/06/2017  . Bee sting allergy    wasp / mixed vespid  . Bell's palsy 02/24/2013  . Chronic venous insufficiency 12/14/2014  . Diabetes ([place])   . Edema 12/14/2014  . Hyperlipidemia, mixed 12/14/2014  . Hypertension   . Lumbar pain with radiation down left leg 07/09/2012  . Morbid obesity ([place]) 12/14/2014  . Morbid obesity with BMI of 45.0-49.9, adult ([place]) 07/09/2012  . Obesity hypoventilation syndrome ([place]) 12/14/2014  . OSA treated with BiPAP 12/14/2014  . Pain syndrome, chronic 07/09/2012  . Pernicious anemia 02/06/2017  . Polyneuropathy in diabetes ([place]) 02/24/2013  . Syndrome affecting cervical region 02/24/2013  . Trigeminal neuralgia 02/24/2013  . Type 2 diabetes mellitus with diabetic neuropathy ([place]) 12/14/2014    Patient Active Problem List   Diagnosis Date Noted  . Hypertension   . Diabetes ([place])   . Bee sting allergy   . Hypotension 12/25/2017  . Chronic anticoagulation 03/07/2017  . Coronary artery calcification seen on CT scan 02/07/2017  . CAD in native artery 02/07/2017  . Persistent atrial fibrillation ([place]) 02/06/2017  . Anxiety 02/06/2017  . Arthritis 02/06/2017  . Pernicious anemia 02/06/2017  . Allergy to pollen 04/06/2015  . OSA treated with BiPAP 12/14/2014  .  Obesity hypoventilation syndrome ([place]) 12/14/2014  . Morbid obesity with BMI of 40.0-44.9, adult ([place]) 12/14/2014  . Hypertensive heart disease 12/14/2014  . Hyperlipidemia, mixed 12/14/2014  . Type 2 diabetes mellitus with diabetic neuropathy ([place]) 12/14/2014  . Chronic venous insufficiency 12/14/2014  . Edema 12/14/2014  . Morbid obesity ([place]) 12/14/2014  . Bell's palsy 02/24/2013  . Polyneuropathy in diabetes ([place]) 02/24/2013  . Syndrome affecting cervical region 02/24/2013  . Trigeminal neuralgia 02/24/2013  . Lumbar pain with radiation down left leg 07/09/2012  . BMI 45.0-49.9, adult ([place]) 07/09/2012  . Pain syndrome, chronic 07/09/2012  . Morbid obesity with BMI of 45.0-49.9, adult ([place]) 07/09/2012    Past Surgical History:  Procedure Laterality Date  . ADENOIDECTOMY    . BACK SURGERY  2002 /2003  . CARPAL TUNNEL RELEASE Bilateral   . KNEE SURGERY Left 2005  . SHOULDER SURGERY Left 1998  . TONSILLECTOMY         Family History  Problem Relation Age of Onset  . Breast cancer Mother   . Heart disease Father   . CAD Father   . Atrial fibrillation Father   . Stomach cancer Paternal Aunt   . Heart disease Paternal Uncle   . Breast cancer Maternal Grandmother     Social History   Tobacco Use  . Smoking status: Former Smoker    Packs/day: 3.00    Years: 35.00    Pack years: 105.00    Types: Cigarettes    Quit date: 04/13/2006    Years since quitting: 13.8  .  Smokeless tobacco: Never Used  Vaping Use  . Vaping Use: Never used  Substance Use Topics  . Alcohol use: No    Alcohol/week: 0.0 standard drinks  . Drug use: No    Home Medications Prior to Admission medications   Medication Sig Start Date End Date Taking? Authorizing Provider  Cyanocobalamin (VITAMIN B 12 PO) Take 2,500 mcg by mouth daily.    [provider]  dicyclomine (BENTYL) 20 MG tablet Take 1 tablet by mouth 2 (two) times daily. 11/27/17   [provider]  DULoxetine  (CYMBALTA) 60 MG capsule Take 60 mg by mouth daily.  01/29/17   [provider]  ELIQUIS 5 MG TABS tablet TAKE 1 TABLET BY MOUTH TWICE DAILY 10/26/19   Richardo Priest, MD  EPINEPHrine 0.3 mg/0.3 mL IJ SOAJ injection Use for life-threatening allergic reactions 07/23/18   Kozlow, Donnamarie Poag, MD  furosemide (LASIX) 40 MG tablet Take 40 mg by mouth daily as needed.     [provider]  gabapentin (NEURONTIN) 300 MG capsule Take 600 mg by mouth 3 (three) times daily.     [provider]  metFORMIN (GLUCOPHAGE) 500 MG tablet Take 500 mg by mouth daily with breakfast.    [provider]  metoprolol tartrate (LOPRESSOR) 25 MG tablet Take 1 tablet (25 mg total) by mouth 3 (three) times daily. 03/07/17   Richardo Priest, MD  MIXED VESPID VENOM PROTEIN [place] Inject into the skin.    [provider]  naloxone [facility]) nasal spray 4 mg/0.1 mL Place 1 spray into the nose. USE 1 SPRAY NASALLY AS NEEDED FOR OPIOID OVERDOSE EMERGENCY    [provider]  nitroGLYCERIN (NITROSTAT) 0.4 MG SL tablet Place 0.4 mg under the tongue every 5 (five) minutes as needed. Reported on 04/26/2015 02/01/14   [provider]  ondansetron (ZOFRAN-ODT) 4 MG disintegrating tablet Take 4 mg by mouth every 8 (eight) hours as needed for nausea or vomiting.    [provider]  oxyCODONE-acetaminophen (PERCOCET) 7.5-325 MG tablet Take 1 tablet by mouth every 8 (eight) hours as needed for severe pain.    [provider]  pantoprazole (PROTONIX) 40 MG tablet Take 40 mg by mouth daily. 08/31/19   [provider]  simvastatin (ZOCOR) 20 MG tablet Take 20 mg by mouth daily.    [provider]  tamsulosin (FLOMAX) 0.4 MG CAPS capsule Take 0.4 mg by mouth daily.    [provider]  VENOMIL WASP VENOM IJ Inject as directed.    [provider]    Allergies    Penicillins, Aspirin, and Bee venom  Review of Systems   Review of Systems  All  other systems reviewed and are negative.   Physical Exam Updated Vital Signs BP (!) 138/94 (BP Location: Right Arm)   Pulse 96   Temp (!) 97.3 F (36.3 C) (Oral)   Resp 18   Ht 6\' 2"  (1.88 m)   Wt (!) 170 kg   SpO2 90%   BMI 48.12 kg/m   Physical Exam Vitals and nursing note reviewed.   Morbidly obese 59 year old male, resting comfortably and in no acute distress. Vital signs are significant for borderline elevated blood pressure. Oxygen saturation is 90%, which is normal. Head is normocephalic and atraumatic. PERRLA, EOMI. Oropharynx is clear. Neck is nontender without adenopathy or JVD. Back is nontender and there is no CVA tenderness. Lungs are clear without rales, wheezes, or rhonchi. Chest is nontender. Heart  has regular rate and rhythm without murmur. Abdomen is soft, flat, nontender without masses or hepatosplenomegaly and peristalsis is normoactive. Extremities: Open dislocation noted of the left ankle with the medial malleolus protruding through skin defect an obvious tear of the tibiotalar ligament.  Dorsalis pedis pulses not palpable on either foot.  Both feet are cool to the touch, but not cold.  Capillary refill is prompt bilaterally. Skin is warm and dry without rash. Neurologic: Mental status is normal, cranial nerves are intact.  He is able to move all 4 extremities.  Decreased sensation noted distal to the proximal lower leg bilaterally.  ED Results / Procedures / Treatments   Labs (all labs ordered are listed, but only abnormal results are displayed) Labs Reviewed  COMPREHENSIVE METABOLIC PANEL - Abnormal; Notable for the following components:      Result Value   Glucose, Bld 165 (*)    Albumin 3.4 (*)    All other components within normal limits  LACTIC ACID, PLASMA - Abnormal; Notable for the following components:   Lactic Acid, Venous 2.6 (*)    All other components within normal limits  I-STAT CHEM 8, ED - Abnormal; Notable for the following  components:   Glucose, Bld 155 (*)    Calcium, Ion 1.10 (*)    All other components within normal limits  RESP PANEL BY RT-PCR (FLU A&B, COVID) ARPGX2  CBC  ETHANOL  PROTIME-INR  URINALYSIS, ROUTINE W REFLEX MICROSCOPIC  SAMPLE TO BLOOD BANK    EKG EKG Interpretation  Date/Time:  Monday January 31 2020 04:20:21 EST Ventricular Rate:  84 PR Interval:    QRS Duration: 114 QT Interval:  375 QTC Calculation: 444 R Axis:   122 Text Interpretation: Atrial fibrillation Left posterior fascicular block Anterior infarct, old Low voltage QRS When compared with ECG of 08/25/2002, Atrial fibrillation has replaced Sinus rhythm Left posterior fasicular block is now present Low voltage QRS is now present Confirmed by Dione Booze ([id number]) on 01/31/2020 4:23:46 AM   Radiology DG Ankle 2 Views Left  Result Date: 01/31/2020 CLINICAL DATA:  Status post trauma. EXAM: LEFT ANKLE - 2 VIEW COMPARISON:  January 25, 2008 FINDINGS: The left ankle was imaged in a fiberglass cast with partially obscured osseous and soft tissue detail. An acute fracture deformity is seen extending through the left lateral malleolus. Approximately 1 shaft width lateral displacement of the distal fracture site is seen. A 1.0 cm cortical density of indeterminate age is seen adjacent to the dorsal aspect of the distal left talus. There is mild lateral dislocation of the left ankle. Moderate severity soft tissue swelling is seen, most prominent along the medial aspect of the left ankle. Mild to moderate severity anterior lateral soft tissue air is also seen. IMPRESSION: 1. Acute fracture of the left lateral malleolus. 2. Mild lateral dislocation of the left ankle. 3. Findings likely consistent with a small fracture of indeterminate age originating from the distal left talus. Electronically Signed   By: Aram Candela M.D.   On: 01/31/2020 03:35   CT Head Wo Contrast  Result Date: 01/31/2020 CLINICAL DATA:  Poly trauma. EXAM: CT  HEAD WITHOUT CONTRAST TECHNIQUE: Contiguous axial images were obtained from the base of the skull through the vertex without intravenous contrast. COMPARISON:  CT head without contrast 12/16/2011 FINDINGS: Brain: Mild diffuse white matter disease is present. No acute infarct, hemorrhage, or mass lesion is present. Basal ganglia are intact. No acute or focal cortical abnormalities are present. No significant extraaxial fluid  collection is present. The ventricles are of normal size. The brainstem and cerebellum are within normal limits. Vascular: Atherosclerotic calcifications are present within the cavernous internal carotid arteries bilaterally. No hyperdense vessel is present. Skull: No significant extracranial soft tissue injury is present. Calvarium is intact. No focal lytic or blastic lesions are present. Sinuses/Orbits: Chronic opacification of the left maxillary sinus noted. Scattered mucosal thickening is present throughout the anterior ethmoid air cells and inferior frontal sinuses bilaterally. Posterior ethmoid air cells and sphenoid sinuses are clear. The mastoid air cells are clear. The globes and orbits are within normal limits. IMPRESSION: 1. No acute intracranial abnormality or significant interval change. 2. Mild diffuse white matter disease likely reflects the sequela of chronic microvascular ischemia. 3. Chronic left maxillary sinus disease. Electronically Signed   By: San Morelle M.D.   On: 01/31/2020 03:58   CT ANGIO LOW EXTREM LEFT W &/OR WO CONTRAST  Addendum Date: 01/31/2020   ADDENDUM REPORT: 01/31/2020 04:54 ADDENDUM: Salient findings discussed by telephone with Dr. Delora Fuel on [id number] at 0445 hours. Electronically Signed   By: Genevie Ann M.D.   On: 01/31/2020 04:54   Result Date: 01/31/2020 CLINICAL DATA:  59 year old male status post fall at 0130 hours. On Eliquis. Compound fracture left ankle with visible bone. EXAM: CT ANGIOGRAPHY OF ILIOFEMORAL RUNOFF, CT ANGIOGRAPHY  LOWER LEFT EXTREMITY, CT ANGIOGRAPHY LOWER RIGHT EXTREMITY TECHNIQUE: Multidetector CT imaging of the abdomen, pelvis and lower extremities was performed using the standard protocol during bolus administration of intravenous contrast. Multiplanar CT image reconstructions and MIPs were obtained to evaluate the vascular anatomy. CONTRAST:  170mL OMNIPAQUE IOHEXOL 350 MG/ML SOLN COMPARISON:  Left ankle radiographs 0319 hours. [facility] CT Abdomen and Pelvis 04/27/2019. FINDINGS: VASCULAR Aorta: Patent distal aorta with atherosclerosis. IMA origin is included and patent. Iliac arteries: Patent bilateral iliac arteries with calcified atherosclerosis. RIGHT Lower Extremity Patent right femoral arteries with mild calcified atherosclerosis. No hemodynamically significant stenosis. Normal right popliteal artery. Runoff: Three-vessel, with no atherosclerosis or stenosis evident. LEFT Lower Extremity Patent left femoral arteries with mild atherosclerosis, most apparent in the distal left SFA (calcified plaque series 5, image 169). No femoral artery stenosis identified. Patent left popliteal artery with mild soft plaque versus mixing artifact on series 5, image 219. No significant stenosis. Runoff: 3 vessel left lower extremity runoff with mild calcified plaque. No discrete anterior or posterior tibial artery injury is identified. Symmetric enhancement of the dorsalis pedis and distal posterior tibial arteries. However, possible small foci of contrast extravasation within hematoma located just posterior and superior to the medial malleolus on series 5, image 357. Additional punctate bone fragments in this region. Regional subcutaneous hematoma measures 3-4 cm. See left ankle fracture details below. Review of the MIP images confirms the above findings. NON-VASCULAR Hepatobiliary: Minimally included right lower liver tip, negative. Pancreas: Minimally included uncinate, negative. Spleen: Not included. Adrenals/Urinary  Tract: Visible kidneys appears stable since March and negative. No hydroureter. Diminutive, unremarkable urinary bladder. Stomach/Bowel: Nondilated visible large and small bowel. Normal retrocecal appendix (series 5, image 22). Diverticulosis of the visible right colon. No free air, free fluid or mesenteric stranding is visible. Lymphatic: No lymphadenopathy in the lower abdomen or pelvis. Reproductive: Negative. Other: No pelvic free fluid. Musculoskeletal: Chronic lower lumbar decompression and fusion. No acute pelvic fracture identified. Both femurs appear intact. Evidence of comminuted but nondisplaced fracture of the lower left patella (series 9, image 64 and series 5, image 229. Small superimposed left knee joint effusion with fairly  simple fluid density (series 5, image 216). Distal femur, proximal left tibia and proximal left fibula appear intact. Contralateral right knee appears intact. Right tib fib, right ankle, and right foot appear intact. Oblique mildly comminuted fracture distal left fibula metadiaphysis. Mildly comminuted fracture of the distal left tibia posterior malleolus. Those fractures are minimally to mildly displaced. Additional avulsion at the tip of the medial malleolus (series 9, image 104). Acute versus chronic fracture fragments about the neck of the talus with gas in the talonavicular joint. Talar dome appears intact. Chronic appearing fracture deformity of the left navicular adjacent to the joint space gas. Left calcaneus appears intact. No definite acute fracture elsewhere in the left foot. Soft tissue hematoma and gas bilaterally about the left ankle. Soft tissue gas tracks cephalad in the left tib fib toward the tibial tuberosity. The most confluent hematoma is posterior and overlying the medial malleolus. IMPRESSION: VASCULAR 1. No discrete lower extremity arterial injury is identified, but small foci of contrast extravasation are suspected within hematoma located superior to the  left medial malleolus (series 5, image 357). 2. Otherwise symmetric bilateral lower extremity three-vessel runoff with mild atherosclerosis and no significant stenosis. 3. Mild distal aorta and bilateral iliofemoral artery atherosclerosis without significant stenosis. NON-VASCULAR 1. Comminuted acute fractures of the distal left fibula, posterior malleolus. More age indeterminate fracture fragments at the medial malleolus, neck of the talus. Associated soft tissue hematoma, and subcutaneous gas tracking up the left leg. 2. Comminuted minimally displaced fracture of the left patella. Small left knee joint effusion. 3. Stable, negative visible lower abdomen and pelvis. Electronically Signed: By: Genevie Ann M.D. On: 01/31/2020 04:44   CT ANGIO LOW EXTREM RIGHT W &/OR WO CONTRAST  Addendum Date: 01/31/2020   ADDENDUM REPORT: 01/31/2020 04:54 ADDENDUM: Salient findings discussed by telephone with Dr. Delora Fuel on [id number] at 0445 hours. Electronically Signed   By: Genevie Ann M.D.   On: 01/31/2020 04:54   Result Date: 01/31/2020 CLINICAL DATA:  59 year old male status post fall at 0130 hours. On Eliquis. Compound fracture left ankle with visible bone. EXAM: CT ANGIOGRAPHY OF ILIOFEMORAL RUNOFF, CT ANGIOGRAPHY LOWER LEFT EXTREMITY, CT ANGIOGRAPHY LOWER RIGHT EXTREMITY TECHNIQUE: Multidetector CT imaging of the abdomen, pelvis and lower extremities was performed using the standard protocol during bolus administration of intravenous contrast. Multiplanar CT image reconstructions and MIPs were obtained to evaluate the vascular anatomy. CONTRAST:  1106mL OMNIPAQUE IOHEXOL 350 MG/ML SOLN COMPARISON:  Left ankle radiographs 0319 hours. [facility] CT Abdomen and Pelvis 04/27/2019. FINDINGS: VASCULAR Aorta: Patent distal aorta with atherosclerosis. IMA origin is included and patent. Iliac arteries: Patent bilateral iliac arteries with calcified atherosclerosis. RIGHT Lower Extremity Patent right femoral arteries with  mild calcified atherosclerosis. No hemodynamically significant stenosis. Normal right popliteal artery. Runoff: Three-vessel, with no atherosclerosis or stenosis evident. LEFT Lower Extremity Patent left femoral arteries with mild atherosclerosis, most apparent in the distal left SFA (calcified plaque series 5, image 169). No femoral artery stenosis identified. Patent left popliteal artery with mild soft plaque versus mixing artifact on series 5, image 219. No significant stenosis. Runoff: 3 vessel left lower extremity runoff with mild calcified plaque. No discrete anterior or posterior tibial artery injury is identified. Symmetric enhancement of the dorsalis pedis and distal posterior tibial arteries. However, possible small foci of contrast extravasation within hematoma located just posterior and superior to the medial malleolus on series 5, image 357. Additional punctate bone fragments in this region. Regional subcutaneous hematoma measures 3-4 cm. See left  ankle fracture details below. Review of the MIP images confirms the above findings. NON-VASCULAR Hepatobiliary: Minimally included right lower liver tip, negative. Pancreas: Minimally included uncinate, negative. Spleen: Not included. Adrenals/Urinary Tract: Visible kidneys appears stable since March and negative. No hydroureter. Diminutive, unremarkable urinary bladder. Stomach/Bowel: Nondilated visible large and small bowel. Normal retrocecal appendix (series 5, image 22). Diverticulosis of the visible right colon. No free air, free fluid or mesenteric stranding is visible. Lymphatic: No lymphadenopathy in the lower abdomen or pelvis. Reproductive: Negative. Other: No pelvic free fluid. Musculoskeletal: Chronic lower lumbar decompression and fusion. No acute pelvic fracture identified. Both femurs appear intact. Evidence of comminuted but nondisplaced fracture of the lower left patella (series 9, image 64 and series 5, image 229. Small superimposed left knee  joint effusion with fairly simple fluid density (series 5, image 216). Distal femur, proximal left tibia and proximal left fibula appear intact. Contralateral right knee appears intact. Right tib fib, right ankle, and right foot appear intact. Oblique mildly comminuted fracture distal left fibula metadiaphysis. Mildly comminuted fracture of the distal left tibia posterior malleolus. Those fractures are minimally to mildly displaced. Additional avulsion at the tip of the medial malleolus (series 9, image 104). Acute versus chronic fracture fragments about the neck of the talus with gas in the talonavicular joint. Talar dome appears intact. Chronic appearing fracture deformity of the left navicular adjacent to the joint space gas. Left calcaneus appears intact. No definite acute fracture elsewhere in the left foot. Soft tissue hematoma and gas bilaterally about the left ankle. Soft tissue gas tracks cephalad in the left tib fib toward the tibial tuberosity. The most confluent hematoma is posterior and overlying the medial malleolus. IMPRESSION: VASCULAR 1. No discrete lower extremity arterial injury is identified, but small foci of contrast extravasation are suspected within hematoma located superior to the left medial malleolus (series 5, image 357). 2. Otherwise symmetric bilateral lower extremity three-vessel runoff with mild atherosclerosis and no significant stenosis. 3. Mild distal aorta and bilateral iliofemoral artery atherosclerosis without significant stenosis. NON-VASCULAR 1. Comminuted acute fractures of the distal left fibula, posterior malleolus. More age indeterminate fracture fragments at the medial malleolus, neck of the talus. Associated soft tissue hematoma, and subcutaneous gas tracking up the left leg. 2. Comminuted minimally displaced fracture of the left patella. Small left knee joint effusion. 3. Stable, negative visible lower abdomen and pelvis. Electronically Signed: By: Genevie Ann M.D. On:  01/31/2020 04:44   DG Chest Port 1 View  Result Date: 01/31/2020 CLINICAL DATA:  Preoperative respiratory exam EXAM: PORTABLE CHEST 1 VIEW COMPARISON:  CT 02/14/2017, radiograph 03/25/2016 FINDINGS: There are some chronically coarsened interstitial and bronchitic features though increased hazy interstitial opacities are present towards the lung bases with slight pulmonary vascular congestion. While these may be partially attributable to body habitus some early interstitial edema or atelectasis could present similarly in the absence of infectious symptoms. The cardiomediastinal contours are fairly prominent though may be accentuated by low volumes and the portable technique. No pneumothorax. No effusion. Telemetry leads overlie the chest. No acute osseous or soft tissue abnormality. IMPRESSION: Hazy interstitial opacities towards the lung bases with slight pulmonary vascular congestion. While these may be partially attributable to body habitus could reflect some mild interstitial edema or atelectasis in the absence of infectious symptoms. Electronically Signed   By: Lovena Le M.D.   On: 01/31/2020 03:51    Procedures Reduction of dislocation  Date/Time: 01/31/2020 3:19 AM Performed by: Delora Fuel, MD Authorized by:  Delora Fuel, MD  Consent: Verbal consent obtained. Written consent not obtained. Risks and benefits: risks, benefits and alternatives were discussed Consent given by: patient Patient understanding: patient states understanding of the procedure being performed Relevant documents: relevant documents present and verified Test results: test results available and properly labeled Site marked: the operative site was marked Imaging studies: imaging studies not available Required items: required blood products, implants, devices, and special equipment available Patient identity confirmed: verbally with patient and arm band Time out: Immediately prior to procedure a "time out" was  called to verify the correct patient, procedure, equipment, support staff and site/side marked as required. Local anesthesia used: no  Anesthesia: Local anesthesia used: no  Sedation: Patient sedated: no  Patient tolerance: patient tolerated the procedure well with no immediate complications Comments: Open dislocation of the left ankle was done with traction and manipulation.  Patient has peripheral neuropathy and did not require any analgesics or sedation for the procedure.  Marland KitchenSplint Application  Date/Time: 01/31/2020 3:21 AM Performed by: Delora Fuel, MD Authorized by: Delora Fuel, MD   Consent:    Consent obtained:  Verbal   Consent given by:  Patient   Risks, benefits, and alternatives were discussed: yes     Risks discussed:  Discoloration and swelling   Alternatives discussed:  No treatment Universal protocol:    Procedure explained and questions answered to patient or proxy's satisfaction: yes     Imaging studies available: no     Required blood products, implants, devices, and special equipment available: yes     Site/side marked: yes     Immediately prior to procedure a time out was called: yes     Patient identity confirmed:  Verbally with patient and arm band Pre-procedure details:    Pre-procedure CMS: No sensation in the foot.   Distal perfusion: brisk capillary refill     Distal perfusion comment:  Pulses not palpable Procedure details:    Location:  Ankle   Ankle location:  L ankle   Strapping: no     Splint type:  Ankle stirrup (Plus short leg posterior)   Supplies:  Fiberglass and elastic bandage   Attestation: Splint applied and adjusted personally by me (Splint applied jointly by orthopedic technician and myself)   Post-procedure details:    Distal neurologic exam:  Unchanged   Distal perfusion: unchanged     Procedure completion:  Tolerated well, no immediate complications   Post-procedure imaging: reviewed    CRITICAL CARE Performed by: Delora Fuel Total critical care time: 60 minutes Critical care time was exclusive of separately billable procedures and treating other patients. Critical care was necessary to treat or prevent imminent or life-threatening deterioration. Critical care was time spent personally by me on the following activities: development of treatment plan with patient and/or surrogate as well as nursing, discussions with consultants, evaluation of patient's response to treatment, examination of patient, obtaining history from patient or surrogate, ordering and performing treatments and interventions, ordering and review of laboratory studies, ordering and review of radiographic studies, pulse oximetry and re-evaluation of patient's condition.  Medications Ordered in ED Medications  acetaminophen (TYLENOL) tablet 325-650 mg (has no administration in time range)  oxyCODONE (Oxy IR/ROXICODONE) immediate release tablet 5-10 mg (has no administration in time range)  oxyCODONE (Oxy IR/ROXICODONE) immediate release tablet 10-15 mg (has no administration in time range)  HYDROmorphone (DILAUDID) injection 0.5-1 mg (has no administration in time range)  gabapentin (NEURONTIN) capsule 300 mg (has no administration in time  range)  clindamycin (CLEOCIN) IVPB 900 mg (900 mg Intravenous New Bag/Given 01/31/20 0556)  methocarbamol (ROBAXIN) tablet 500 mg (has no administration in time range)    Or  methocarbamol (ROBAXIN) 500 mg in dextrose 5 % 50 mL IVPB (has no administration in time range)  diphenhydrAMINE (BENADRYL) 12.5 MG/5ML elixir 12.5-25 mg (has no administration in time range)  senna-docusate (Senokot-S) tablet 1 tablet (has no administration in time range)  bisacodyl (DULCOLAX) suppository 10 mg (has no administration in time range)  ondansetron (ZOFRAN) tablet 4 mg (has no administration in time range)    Or  ondansetron (ZOFRAN) injection 4 mg (has no administration in time range)  metoCLOPramide (REGLAN) tablet 5-10  mg (has no administration in time range)    Or  metoCLOPramide (REGLAN) injection 5-10 mg (has no administration in time range)  ceFAZolin (ANCEF) IVPB 2g/100 mL premix (0 g Intravenous Stopped 01/31/20 0547)  iohexol (OMNIPAQUE) 350 MG/ML injection 100 mL (100 mLs Intravenous Contrast Given 01/31/20 0414)    ED Course  I have reviewed the triage vital signs and the nursing notes.  Pertinent labs & imaging results that were available during my care of the patient were reviewed by me and considered in my medical decision making (see chart for details).  MDM Rules/Calculators/A&P Open ankle dislocation.  There is significant amount of exposed bone, so the dislocation was promptly reduced and splint applied.  X-rays were not obtained until after the reduction had been done.  X-rays show fracture of the distal fibula and persistent widening of the ankle mortise.  He is given a dose of cefazolin.  ECG shows atrial fibrillation, left posterior fascicular block.  Labs are significant for elevated lactic acid level which is felt to be related to his trauma and not felt to represent sepsis.  CT of head is unremarkable, no acute findings.  CT angiogram shows no evidence of arterial injury, but there is apparently a fracture of the patella.  Case has been discussed with Dr. Percell Miller, on-call for orthopedics who will admit the patient.  Final Clinical Impression(s) / ED Diagnoses Final diagnoses:  Open fracture dislocation of left ankle  Closed nondisplaced fracture of left patella, unspecified fracture morphology, initial encounter  Chronic anticoagulation    Rx / DC Orders ED Discharge Orders    None       Delora Fuel, MD [id number] [phone]

## 2020-01-31 NOTE — Anesthesia Preprocedure Evaluation (Addendum)
Anesthesia Evaluation  Patient identified by MRN, date of birth, ID band Patient awake    Reviewed: Allergy & Precautions, NPO status , Patient's Chart, lab work & pertinent test results, reviewed documented beta blocker date and time   Airway Mallampati: III  TM Distance: >3 FB Neck ROM: Full    Dental  (+) Edentulous Upper, Edentulous Lower   Pulmonary sleep apnea (does not consistantly use CPAP) and Continuous Positive Airway Pressure Ventilation , former smoker,  Quit smoking 2008, 105 pack year history    breath sounds clear to auscultation       Cardiovascular hypertension, Pt. on home beta blockers + dysrhythmias (eliquis) Atrial Fibrillation  Rhythm:Irregular Rate:Tachycardia  Last echo 2019: Technically difficult and limited study.  Normal LVEF.  Mild to moderate LAE.  No significant aortic stenosis identify.   Has not gotten any of his BB since yesterday, HR 90s   Neuro/Psych PSYCHIATRIC DISORDERS Anxiety negative neurological ROS     GI/Hepatic negative GI ROS, Neg liver ROS,   Endo/Other  diabetes, Well Controlled, Type 2, Oral Hypoglycemic AgentsMorbid obesityBMI 48  Renal/GU negative Renal ROS  negative genitourinary   Musculoskeletal  (+) Arthritis , Osteoarthritis,  L ankle fx Chronic pain    Abdominal (+) + obese,   Peds  Hematology hct 44    Anesthesia Other Findings   Reproductive/Obstetrics negative OB ROS                           Anesthesia Physical Anesthesia Plan  ASA: III  Anesthesia Plan: General and Regional   Post-op Pain Management: GA combined w/ Regional for post-op pain   Induction: Intravenous  PONV Risk Score and Plan: Ondansetron, Dexamethasone, Midazolam and Treatment may vary due to age or medical condition  Airway Management Planned: LMA  Additional Equipment: None  Intra-op Plan:   Post-operative Plan: Extubation in OR  Informed  Consent: I have reviewed the patients History and Physical, chart, labs and discussed the procedure including the risks, benefits and alternatives for the proposed anesthesia with the patient or authorized representative who has indicated his/her understanding and acceptance.     Dental advisory given  Plan Discussed with: CRNA  Anesthesia Plan Comments: (Poorly controlled HTN and elevated HR likely because he has not received any of his home medications today; will treat in OR. )       Anesthesia Quick Evaluation

## 2020-01-31 NOTE — Anesthesia Procedure Notes (Addendum)
Anesthesia Regional Block: Popliteal block   Pre-Anesthetic Checklist: ,, timeout performed, Correct Patient, Correct Site, Correct Laterality, Correct Procedure, Correct Position, site marked, Risks and benefits discussed,  Surgical consent,  Pre-op evaluation,  At surgeon's request and post-op pain management  Laterality: Left  Prep: Maximum Sterile Barrier Precautions used, chloraprep       Needles:  Injection technique: Single-shot  Needle Type: Echogenic Stimulator Needle     Needle Length: 9cm  Needle Gauge: 22     Additional Needles:   Procedures:,,,, ultrasound used (permanent image in chart),,,,  Narrative:  Start time: 01/31/2020 12:50 PM End time: 01/31/2020 12:55 PM Injection made incrementally with aspirations every 5 mL.  Performed by: Personally  Anesthesiologist: Lannie Fields, DO  Additional Notes: Monitors applied. No increased pain on injection. No increased resistance to injection. Injection made in 5cc increments. Good needle visualization. Patient tolerated procedure well.

## 2020-01-31 NOTE — H&P (Signed)
H&P  Chief Complaint: left ankle fracture  HPI: David Fitzgerald is a 59 y.o. male who reports to the ED after he fell at home while he was working on plumbing around his house. He says he was crouched down and went to stand up and he fell backwards. He has a history of severe neuropathy and thus did not feel pain in the left ankle. He was able to stand up and walk inside the house where he then heard a crack and fell to the ground again. He looked at his left ankle and saw an obvious deformity with bone sticking out of his skin.   Imaging shows  1. Acute fracture of the left lateral malleolus. 2. Mild lateral dislocation of the left ankle. 3. Findings likely consistent with a small fracture of indeterminate age originating from the distal left talus   Results for orders placed or performed during the hospital encounter of 01/31/20 (from the past 48 hour(s))  Resp Panel by RT-PCR (Flu A&B, Covid) Nasopharyngeal Swab     Status: None   Collection Time: 01/31/20  3:15 AM   Specimen: Nasopharyngeal Swab; Nasopharyngeal(NP) swabs in vial transport medium  Result Value Ref Range   SARS Coronavirus 2 by RT PCR NEGATIVE NEGATIVE    Comment: (NOTE) SARS-CoV-2 target nucleic acids are NOT DETECTED.  The SARS-CoV-2 RNA is generally detectable in upper respiratory specimens during the acute phase of infection. The lowest concentration of SARS-CoV-2 viral copies this assay can detect is 138 copies/mL. A negative result does not preclude SARS-Cov-2 infection and should not be used as the sole basis for treatment or other patient management decisions. A negative result may occur with  improper specimen collection/handling, submission of specimen other than nasopharyngeal swab, presence of viral mutation(s) within the areas targeted by this assay, and inadequate number of viral copies(<138 copies/mL). A negative result must be combined with clinical observations, patient history, and  epidemiological information. The expected result is Negative.  Fact Sheet for Patients:  EntrepreneurPulse.com.au  Fact Sheet for Healthcare Providers:  IncredibleEmployment.be  This test is no t yet approved or cleared by the [place] FDA and  has been authorized for detection and/or diagnosis of SARS-CoV-2 by FDA under an Emergency Use Authorization (EUA). This EUA will remain  in effect (meaning this test can be used) for the duration of the COVID-19 declaration under Section 564(b)(1) of the Act, 21 U.S.C.section 360bbb-3(b)(1), unless the authorization is terminated  or revoked sooner.       Influenza A by PCR NEGATIVE NEGATIVE   Influenza B by PCR NEGATIVE NEGATIVE    Comment: (NOTE) The Xpert Xpress SARS-CoV-2/FLU/RSV plus assay is intended as an aid in the diagnosis of influenza from Nasopharyngeal swab specimens and should not be used as a sole basis for treatment. Nasal washings and aspirates are unacceptable for Xpert Xpress SARS-CoV-2/FLU/RSV testing.  Fact Sheet for Patients: EntrepreneurPulse.com.au  Fact Sheet for Healthcare Providers: IncredibleEmployment.be  This test is not yet approved or cleared by the [place] FDA and has been authorized for detection and/or diagnosis of SARS-CoV-2 by FDA under an Emergency Use Authorization (EUA). This EUA will remain in effect (meaning this test can be used) for the duration of the COVID-19 declaration under Section 564(b)(1) of the Act, 21 U.S.C. section 360bbb-3(b)(1), unless the authorization is terminated or revoked.  Performed at [facility], [place] [address], [place], [place] [address]   Comprehensive metabolic panel     Status:  Abnormal   Collection Time: 01/31/20  3:20 AM  Result Value Ref Range   Sodium 137 135 - 145 mmol/L   Potassium 3.8 3.5 - 5.1 mmol/L   Chloride 100 98 - 111 mmol/L   CO2 26 22 - 32 mmol/L    Glucose, Bld 165 (H) 70 - 99 mg/dL    Comment: Glucose reference range applies only to samples taken after fasting for at least 8 hours.   BUN 11 6 - 20 mg/dL   Creatinine, Ser 1.12 0.61 - 1.24 mg/dL   Calcium 8.9 8.9 - 10.3 mg/dL   Total Protein 7.2 6.5 - 8.1 g/dL   Albumin 3.4 (L) 3.5 - 5.0 g/dL   AST 29 15 - 41 U/L   ALT 24 0 - 44 U/L   Alkaline Phosphatase 66 38 - 126 U/L   Total Bilirubin 0.7 0.3 - 1.2 mg/dL   GFR, Estimated >60 >60 mL/min    Comment: (NOTE) Calculated using the CKD-EPI Creatinine Equation (2021)    Anion gap 11 5 - 15    Comment: Performed at [facility] [address], [place] [address]  CBC     Status: None   Collection Time: 01/31/20  3:20 AM  Result Value Ref Range   WBC 9.2 4.0 - 10.5 K/uL   RBC 4.73 4.22 - 5.81 MIL/uL   Hemoglobin 14.6 13.0 - 17.0 g/dL   HCT 43.6 39.0 - 52.0 %   MCV 92.2 80.0 - 100.0 fL   MCH 30.9 26.0 - 34.0 pg   MCHC 33.5 30.0 - 36.0 g/dL   RDW 14.9 11.5 - 15.5 %   Platelets 213 150 - 400 K/uL   nRBC 0.0 0.0 - 0.2 %    Comment: Performed at [facility], [place] [address], [place], [place] [address]  Ethanol     Status: None   Collection Time: 01/31/20  3:20 AM  Result Value Ref Range   Alcohol, Ethyl (B) <10 <10 mg/dL    Comment: (NOTE) Lowest detectable limit for serum alcohol is 10 mg/dL.  For medical purposes only. Performed at [facility], [place] [address], [place], [place] [address]   Lactic acid, plasma     Status: Abnormal   Collection Time: 01/31/20  3:20 AM  Result Value Ref Range   Lactic Acid, Venous 2.6 (HH) 0.5 - 1.9 mmol/L    Comment: CRITICAL RESULT CALLED TO, READ BACK BY AND VERIFIED WITH: Malachi Paradise RN [phone] Sander Radon Performed at [facility], [place] [address] [place], [place] [address]   Protime-INR     Status: None   Collection Time: 01/31/20  3:20 AM  Result Value Ref Range   Prothrombin Time 14.0 11.4 - 15.2 seconds   INR 1.1 0.8 - 1.2    Comment:  (NOTE) INR goal varies based on device and disease states. Performed at [facility], [place] [address], [place], [place] [address]   Sample to Blood Bank     Status: None   Collection Time: 01/31/20  3:20 AM  Result Value Ref Range   Blood Bank Specimen SAMPLE AVAILABLE FOR TESTING    Sample Expiration      02/01/2020,2359 Performed at [facility], [place] [address] [place],  [address]   I-Stat Chem 8, ED     Status: Abnormal   Collection Time: 01/31/20  3:29 AM  Result Value Ref Range   Sodium 138 135 -  145 mmol/L   Potassium 3.8 3.5 - 5.1 mmol/L   Chloride 99 98 - 111 mmol/L   BUN 13 6 - 20 mg/dL   Creatinine, Ser 1.00 0.61 - 1.24 mg/dL   Glucose, Bld 155 (H) 70 - 99 mg/dL    Comment: Glucose reference range applies only to samples taken after fasting for at least 8 hours.   Calcium, Ion 1.10 (L) 1.15 - 1.40 mmol/L   TCO2 25 22 - 32 mmol/L   Hemoglobin 15.0 13.0 - 17.0 g/dL   HCT 44.0 39.0 - 52.0 %       Past Medical History:  Diagnosis Date  . Allergy to pollen 04/06/2015  . Anxiety 02/06/2017  . Arthritis 02/06/2017  . Bee sting allergy    wasp / mixed vespid  . Bell's palsy 02/24/2013  . Chronic venous insufficiency 12/14/2014  . Diabetes ([place])   . Edema 12/14/2014  . Hyperlipidemia, mixed 12/14/2014  . Hypertension   . Lumbar pain with radiation down left leg 07/09/2012  . Morbid obesity ([place]) 12/14/2014  . Morbid obesity with BMI of 45.0-49.9, adult ([place]) 07/09/2012  . Obesity hypoventilation syndrome ([place]) 12/14/2014  . OSA treated with BiPAP 12/14/2014  . Pain syndrome, chronic 07/09/2012  . Pernicious anemia 02/06/2017  . Polyneuropathy in diabetes ([place]) 02/24/2013  . Syndrome affecting cervical region 02/24/2013  . Trigeminal neuralgia 02/24/2013  . Type 2 diabetes mellitus with diabetic neuropathy ([place]) 12/14/2014   Past Surgical History:  Procedure Laterality Date  . ADENOIDECTOMY    . BACK SURGERY  2002 /2003  . CARPAL TUNNEL RELEASE Bilateral   . KNEE  SURGERY Left 2005  . SHOULDER SURGERY Left 1998  . TONSILLECTOMY     Social History   Socioeconomic History  . Marital status: Married    Spouse name: Not on file  . Number of children: Not on file  . Years of education: Not on file  . Highest education level: Not on file  Occupational History  . Not on file  Tobacco Use  . Smoking status: Former Smoker    Packs/day: 3.00    Years: 35.00    Pack years: 105.00    Types: Cigarettes    Quit date: 04/13/2006    Years since quitting: 13.8  . Smokeless tobacco: Never Used  Vaping Use  . Vaping Use: Never used  Substance and Sexual Activity  . Alcohol use: No    Alcohol/week: 0.0 standard drinks  . Drug use: No  . Sexual activity: Not on file  Other Topics Concern  . Not on file  Social History Narrative  . Not on file   Social Determinants of Health   Financial Resource Strain: Not on file  Food Insecurity: Not on file  Transportation Needs: Not on file  Physical Activity: Not on file  Stress: Not on file  Social Connections: Not on file   Family History  Problem Relation Age of Onset  . Breast cancer Mother   . Heart disease Father   . CAD Father   . Atrial fibrillation Father   . Stomach cancer Paternal Aunt   . Heart disease Paternal Uncle   . Breast cancer Maternal Grandmother    Allergies  Allergen Reactions  . Penicillins Swelling  . Aspirin     Relative contraindication due to hematochezia while on it  . Bee Venom    Prior to Admission medications   Medication Sig Start Date End Date Taking? Authorizing Provider  Cyanocobalamin (VITAMIN B 12  PO) Take 2,500 mcg by mouth daily.   Yes [provider]  dicyclomine (BENTYL) 20 MG tablet Take 1 tablet by mouth 2 (two) times daily. 11/27/17  Yes [provider]  DULoxetine (CYMBALTA) 60 MG capsule Take 60 mg by mouth daily.  01/29/17  Yes [provider]  ELIQUIS 5 MG TABS tablet TAKE 1 TABLET BY MOUTH TWICE DAILY Patient taking  differently: Take 5 mg by mouth 2 (two) times daily. 10/26/19  Yes Richardo Priest, MD  EPINEPHrine 0.3 mg/0.3 mL IJ SOAJ injection Use for life-threatening allergic reactions 07/23/18  Yes Kozlow, Donnamarie Poag, MD  furosemide (LASIX) 40 MG tablet Take 40 mg by mouth daily as needed for fluid or edema.   Yes [provider]  gabapentin (NEURONTIN) 300 MG capsule Take 600 mg by mouth 3 (three) times daily.    Yes [provider]  metFORMIN (GLUCOPHAGE) 500 MG tablet Take 500 mg by mouth daily with breakfast.   Yes [provider]  metoprolol tartrate (LOPRESSOR) 25 MG tablet Take 1 tablet (25 mg total) by mouth 3 (three) times daily. Patient taking differently: Take 25-50 mg by mouth See admin instructions. 50 mg in the am and 60m  at bedtime. 03/07/17  Yes MRichardo Priest MD  MIXED VESPID VENOM PROTEIN [place] Inject into the skin.   Yes [provider]  naloxone (NARCAN) nasal spray 4 mg/0.1 mL Place 1 spray into the nose See admin instructions. USE 1 SPRAY NASALLY AS NEEDED FOR OPIOID OVERDOSE EMERGENCY   Yes [provider]  naproxen sodium (ALEVE) 220 MG tablet Take 440 mg by mouth daily as needed (headache).   Yes [provider]  nitroGLYCERIN (NITROSTAT) 0.4 MG SL tablet Place 0.4 mg under the tongue every 5 (five) minutes as needed. Reported on 04/26/2015 02/01/14  Yes [provider]  ondansetron (ZOFRAN-ODT) 4 MG disintegrating tablet Take 4 mg by mouth every 8 (eight) hours as needed for nausea or vomiting.   Yes [provider]  oxyCODONE-acetaminophen (PERCOCET) 7.5-325 MG tablet Take 1 tablet by mouth every 8 (eight) hours as needed for severe pain.   Yes [provider]  pantoprazole (PROTONIX) 40 MG tablet Take 40 mg by mouth daily. 08/31/19  Yes [provider]  simvastatin (ZOCOR) 20 MG tablet Take 20 mg by mouth at bedtime.   Yes [provider]  tamsulosin (FLOMAX) 0.4 MG CAPS capsule Take 0.4 mg by  mouth at bedtime.   Yes [provider]  VENOMIL WASP VENOM IJ Inject as directed.   Yes [provider]   DG Ankle 2 Views Left  Result Date: 01/31/2020 CLINICAL DATA:  Status post trauma. EXAM: LEFT ANKLE - 2 VIEW COMPARISON:  January 25, 2008 FINDINGS: The left ankle was imaged in a fiberglass cast with partially obscured osseous and soft tissue detail. An acute fracture deformity is seen extending through the left lateral malleolus. Approximately 1 shaft width lateral displacement of the distal fracture site is seen. A 1.0 cm cortical density of indeterminate age is seen adjacent to the dorsal aspect of the distal left talus. There is mild lateral dislocation of the left ankle. Moderate severity soft tissue swelling is seen, most prominent along the medial aspect of the left ankle. Mild to moderate severity anterior lateral soft tissue air is also seen. IMPRESSION: 1. Acute fracture of the left lateral malleolus. 2. Mild lateral dislocation of the left ankle. 3. Findings likely consistent with a small fracture of indeterminate  age originating from the distal left talus. Electronically Signed   By: Virgina Norfolk M.D.   On: 01/31/2020 03:35   CT Head Wo Contrast  Result Date: 01/31/2020 CLINICAL DATA:  Poly trauma. EXAM: CT HEAD WITHOUT CONTRAST TECHNIQUE: Contiguous axial images were obtained from the base of the skull through the vertex without intravenous contrast. COMPARISON:  CT head without contrast 12/16/2011 FINDINGS: Brain: Mild diffuse white matter disease is present. No acute infarct, hemorrhage, or mass lesion is present. Basal ganglia are intact. No acute or focal cortical abnormalities are present. No significant extraaxial fluid collection is present. The ventricles are of normal size. The brainstem and cerebellum are within normal limits. Vascular: Atherosclerotic calcifications are present within the cavernous internal carotid arteries bilaterally. No hyperdense  vessel is present. Skull: No significant extracranial soft tissue injury is present. Calvarium is intact. No focal lytic or blastic lesions are present. Sinuses/Orbits: Chronic opacification of the left maxillary sinus noted. Scattered mucosal thickening is present throughout the anterior ethmoid air cells and inferior frontal sinuses bilaterally. Posterior ethmoid air cells and sphenoid sinuses are clear. The mastoid air cells are clear. The globes and orbits are within normal limits. IMPRESSION: 1. No acute intracranial abnormality or significant interval change. 2. Mild diffuse white matter disease likely reflects the sequela of chronic microvascular ischemia. 3. Chronic left maxillary sinus disease. Electronically Signed   By: San Morelle M.D.   On: 01/31/2020 03:58   CT ANGIO LOW EXTREM LEFT W &/OR WO CONTRAST  Addendum Date: 01/31/2020   ADDENDUM REPORT: 01/31/2020 04:54 ADDENDUM: Salient findings discussed by telephone with Dr. Delora Fuel on 80/32/1224 at 0445 hours. Electronically Signed   By: Genevie Ann M.D.   On: 01/31/2020 04:54   Result Date: 01/31/2020 CLINICAL DATA:  59 year old male status post fall at 0130 hours. On Eliquis. Compound fracture left ankle with visible bone. EXAM: CT ANGIOGRAPHY OF ILIOFEMORAL RUNOFF, CT ANGIOGRAPHY LOWER LEFT EXTREMITY, CT ANGIOGRAPHY LOWER RIGHT EXTREMITY TECHNIQUE: Multidetector CT imaging of the abdomen, pelvis and lower extremities was performed using the standard protocol during bolus administration of intravenous contrast. Multiplanar CT image reconstructions and MIPs were obtained to evaluate the vascular anatomy. CONTRAST:  159m OMNIPAQUE IOHEXOL 350 MG/ML SOLN COMPARISON:  Left ankle radiographs 0319 hours[facility] FINDINGS: VASCULAR Aorta: Patent distal aorta with atherosclerosis. IMA origin is included and patent. Iliac arteries: Patent bilateral iliac arteries with calcified atherosclerosis. RIGHT  Lower Extremity Patent right femoral arteries with mild calcified atherosclerosis. No hemodynamically significant stenosis. Normal right popliteal artery. Runoff: Three-vessel, with no atherosclerosis or stenosis evident. LEFT Lower Extremity Patent left femoral arteries with mild atherosclerosis, most apparent in the distal left SFA (calcified plaque series 5, image 169). No femoral artery stenosis identified. Patent left popliteal artery with mild soft plaque versus mixing artifact on series 5, image 219. No significant stenosis. Runoff: 3 vessel left lower extremity runoff with mild calcified plaque. No discrete anterior or posterior tibial artery injury is identified. Symmetric enhancement of the dorsalis pedis and distal posterior tibial arteries. However, possible small foci of contrast extravasation within hematoma located just posterior and superior to the medial malleolus on series 5, image 357. Additional punctate bone fragments in this region. Regional subcutaneous hematoma measures 3-4 cm. See left ankle fracture details below. Review of the MIP images confirms the above findings. NON-VASCULAR Hepatobiliary: Minimally included right lower liver tip, negative. Pancreas: Minimally included uncinate, negative. Spleen: Not included. Adrenals/Urinary Tract: Visible kidneys appears stable  since March and negative. No hydroureter. Diminutive, unremarkable urinary bladder. Stomach/Bowel: Nondilated visible large and small bowel. Normal retrocecal appendix (series 5, image 22). Diverticulosis of the visible right colon. No free air, free fluid or mesenteric stranding is visible. Lymphatic: No lymphadenopathy in the lower abdomen or pelvis. Reproductive: Negative. Other: No pelvic free fluid. Musculoskeletal: Chronic lower lumbar decompression and fusion. No acute pelvic fracture identified. Both femurs appear intact. Evidence of comminuted but nondisplaced fracture of the lower left patella (series 9, image 64  and series 5, image 229. Small superimposed left knee joint effusion with fairly simple fluid density (series 5, image 216). Distal femur, proximal left tibia and proximal left fibula appear intact. Contralateral right knee appears intact. Right tib fib, right ankle, and right foot appear intact. Oblique mildly comminuted fracture distal left fibula metadiaphysis. Mildly comminuted fracture of the distal left tibia posterior malleolus. Those fractures are minimally to mildly displaced. Additional avulsion at the tip of the medial malleolus (series 9, image 104). Acute versus chronic fracture fragments about the neck of the talus with gas in the talonavicular joint. Talar dome appears intact. Chronic appearing fracture deformity of the left navicular adjacent to the joint space gas. Left calcaneus appears intact. No definite acute fracture elsewhere in the left foot. Soft tissue hematoma and gas bilaterally about the left ankle. Soft tissue gas tracks cephalad in the left tib fib toward the tibial tuberosity. The most confluent hematoma is posterior and overlying the medial malleolus. IMPRESSION: VASCULAR 1. No discrete lower extremity arterial injury is identified, but small foci of contrast extravasation are suspected within hematoma located superior to the left medial malleolus (series 5, image 357). 2. Otherwise symmetric bilateral lower extremity three-vessel runoff with mild atherosclerosis and no significant stenosis. 3. Mild distal aorta and bilateral iliofemoral artery atherosclerosis without significant stenosis. NON-VASCULAR 1. Comminuted acute fractures of the distal left fibula, posterior malleolus. More age indeterminate fracture fragments at the medial malleolus, neck of the talus. Associated soft tissue hematoma, and subcutaneous gas tracking up the left leg. 2. Comminuted minimally displaced fracture of the left patella. Small left knee joint effusion. 3. Stable, negative visible lower abdomen and  pelvis. Electronically Signed: By: Genevie Ann M.D. On: 01/31/2020 04:44   CT ANGIO LOW EXTREM RIGHT W &/OR WO CONTRAST  Addendum Date: 01/31/2020   ADDENDUM REPORT: 01/31/2020 04:54 ADDENDUM: Salient findings discussed by telephone with Dr. Delora Fuel on 40/98/1191 at 0445 hours. Electronically Signed   By: Genevie Ann M.D.   On: 01/31/2020 04:54   Result Date: 01/31/2020 CLINICAL DATA:  59 year old male status post fall at 0130 hours. On Eliquis. Compound fracture left ankle with visible bone. EXAM: CT ANGIOGRAPHY OF ILIOFEMORAL RUNOFF, CT ANGIOGRAPHY LOWER LEFT EXTREMITY, CT ANGIOGRAPHY LOWER RIGHT EXTREMITY TECHNIQUE: Multidetector CT imaging of the abdomen, pelvis and lower extremities was performed using the standard protocol during bolus administration of intravenous contrast. Multiplanar CT image reconstructions and MIPs were obtained to evaluate the vascular anatomy. CONTRAST:  152m OMNIPAQUE IOHEXOL 350 MG/ML SOLN COMPARISON:  Left ankle radiographs 0319 hours. [facility]. FINDINGS: VASCULAR Aorta: Patent distal aorta with atherosclerosis. IMA origin is included and patent. Iliac arteries: Patent bilateral iliac arteries with calcified atherosclerosis. RIGHT Lower Extremity Patent right femoral arteries with mild calcified atherosclerosis. No hemodynamically significant stenosis. Normal right popliteal artery. Runoff: Three-vessel, with no atherosclerosis or stenosis evident. LEFT Lower Extremity Patent left femoral arteries with mild atherosclerosis, most apparent in the distal left SFA (  calcified plaque series 5, image 169). No femoral artery stenosis identified. Patent left popliteal artery with mild soft plaque versus mixing artifact on series 5, image 219. No significant stenosis. Runoff: 3 vessel left lower extremity runoff with mild calcified plaque. No discrete anterior or posterior tibial artery injury is identified. Symmetric enhancement of the dorsalis  pedis and distal posterior tibial arteries. However, possible small foci of contrast extravasation within hematoma located just posterior and superior to the medial malleolus on series 5, image 357. Additional punctate bone fragments in this region. Regional subcutaneous hematoma measures 3-4 cm. See left ankle fracture details below. Review of the MIP images confirms the above findings. NON-VASCULAR Hepatobiliary: Minimally included right lower liver tip, negative. Pancreas: Minimally included uncinate, negative. Spleen: Not included. Adrenals/Urinary Tract: Visible kidneys appears stable since March and negative. No hydroureter. Diminutive, unremarkable urinary bladder. Stomach/Bowel: Nondilated visible large and small bowel. Normal retrocecal appendix (series 5, image 22). Diverticulosis of the visible right colon. No free air, free fluid or mesenteric stranding is visible. Lymphatic: No lymphadenopathy in the lower abdomen or pelvis. Reproductive: Negative. Other: No pelvic free fluid. Musculoskeletal: Chronic lower lumbar decompression and fusion. No acute pelvic fracture identified. Both femurs appear intact. Evidence of comminuted but nondisplaced fracture of the lower left patella (series 9, image 64 and series 5, image 229. Small superimposed left knee joint effusion with fairly simple fluid density (series 5, image 216). Distal femur, proximal left tibia and proximal left fibula appear intact. Contralateral right knee appears intact. Right tib fib, right ankle, and right foot appear intact. Oblique mildly comminuted fracture distal left fibula metadiaphysis. Mildly comminuted fracture of the distal left tibia posterior malleolus. Those fractures are minimally to mildly displaced. Additional avulsion at the tip of the medial malleolus (series 9, image 104). Acute versus chronic fracture fragments about the neck of the talus with gas in the talonavicular joint. Talar dome appears intact. Chronic appearing  fracture deformity of the left navicular adjacent to the joint space gas. Left calcaneus appears intact. No definite acute fracture elsewhere in the left foot. Soft tissue hematoma and gas bilaterally about the left ankle. Soft tissue gas tracks cephalad in the left tib fib toward the tibial tuberosity. The most confluent hematoma is posterior and overlying the medial malleolus. IMPRESSION: VASCULAR 1. No discrete lower extremity arterial injury is identified, but small foci of contrast extravasation are suspected within hematoma located superior to the left medial malleolus (series 5, image 357). 2. Otherwise symmetric bilateral lower extremity three-vessel runoff with mild atherosclerosis and no significant stenosis. 3. Mild distal aorta and bilateral iliofemoral artery atherosclerosis without significant stenosis. NON-VASCULAR 1. Comminuted acute fractures of the distal left fibula, posterior malleolus. More age indeterminate fracture fragments at the medial malleolus, neck of the talus. Associated soft tissue hematoma, and subcutaneous gas tracking up the left leg. 2. Comminuted minimally displaced fracture of the left patella. Small left knee joint effusion. 3. Stable, negative visible lower abdomen and pelvis. Electronically Signed: By: Genevie Ann M.D. On: 01/31/2020 04:44   DG Chest Port 1 View  Result Date: 01/31/2020 CLINICAL DATA:  Preoperative respiratory exam EXAM: PORTABLE CHEST 1 VIEW COMPARISON:  CT 02/14/2017, radiograph 03/25/2016 FINDINGS: There are some chronically coarsened interstitial and bronchitic features though increased hazy interstitial opacities are present towards the lung bases with slight pulmonary vascular congestion. While these may be partially attributable to body habitus some early interstitial edema or atelectasis could present similarly in the absence of infectious symptoms. The cardiomediastinal  contours are fairly prominent though may be accentuated by low volumes and the  portable technique. No pneumothorax. No effusion. Telemetry leads overlie the chest. No acute osseous or soft tissue abnormality. IMPRESSION: Hazy interstitial opacities towards the lung bases with slight pulmonary vascular congestion. While these may be partially attributable to body habitus could reflect some mild interstitial edema or atelectasis in the absence of infectious symptoms. Electronically Signed   By: Lovena Le M.D.   On: 01/31/2020 03:51    Positive ROS: All other systems have been reviewed and were otherwise negative with the exception of those mentioned in the HPI and as above.  Objective: Labs cbc Recent Labs    01/31/20 0320 01/31/20 0329  WBC 9.2  --   HGB 14.6 15.0  HCT 43.6 44.0  PLT 213  --     Labs inflam No results for input(s): CRP in the last 72 hours.  Invalid input(s): ESR  Labs coag Recent Labs    01/31/20 0320  INR 1.1    Recent Labs    01/31/20 0320 01/31/20 0329  NA 137 138  K 3.8 3.8  CL 100 99  CO2 26  --   GLUCOSE 165* 155*  BUN 11 13  CREATININE 1.12 1.00  CALCIUM 8.9  --     Physical Exam: Vitals:   01/31/20 0900 01/31/20 0939  BP: (!) 155/89 (!) 157/66  Pulse: 77 95  Resp:  18  Temp:  98.2 F (36.8 C)  SpO2: 93% 95%   General: Alert, no acute distress. Morbidly obese male, laying on stretcher.  Mental status: Alert and Oriented x3 Neurologic: Speech Clear and organized, no gross focal findings or movement disorder appreciated. Respiratory: No cyanosis, no use of accessory musculature Cardiovascular: RRR. No m/r/g GI: Abdomen is soft and non-tender Skin: Warm and dry. No rashes Extremities: Warm and well perfused Psychiatric: Patient is competent for consent with normal mood and affect  MUSCULOSKELETAL:  Left ankle is in a posterior and sugar tong splint with ACE wrap on top. Blood can be seen soaking through the ACE wrap and onto the pillow propping his ankle up.  Patient is able to wiggle his toes and do a  straight leg raise with no difficulty. He has a prior history of patella fracture and a small fragment can be palpated on the lateral aspect of the patella. This is not new. Other extremities are atraumatic with painless ROM and NVI.  Assessment / Plan: Active Problems:   Open left ankle fracture   Discussed case with Dr. Marcelino Scot who will operate on him later today.    Jacqulynn Shappell Georgianne Fick PA-C 01/31/2020 11:35 AM

## 2020-01-31 NOTE — ED Triage Notes (Signed)
Pt transported from home by [facility], pt reports he fell while walking @0130 . Pt is on Eliquis, deformity to L ankle, bone visible.  Pt denies pain, hx of neuropathy

## 2020-01-31 NOTE — Discharge Instructions (Addendum)
Orthopaedic Trauma Service Discharge Instructions   General Discharge Instructions  Orthopaedic Injuries:  Open left ankle fracture dislocation treated with ORIF   WEIGHT BEARING STATUS: Nonweightbearing left leg   RANGE OF MOTION/ACTIVITY: toe and knee motion ok. Activity as tolerated while maintaining weightbearing restrictions   Bone health: labs show vitamin d deficiency. Take supplements that were prescribed for you   Wound Care: do not remove cast. Do not get cast wet. Call office with questions. [phone]  DVT/PE prophylaxis: home anticoagulation   Diet: as you were eating previously.  Can use over the counter stool softeners and bowel preparations, such as Miralax, to help with bowel movements.  Narcotics can be constipating.  Be sure to drink plenty of fluids  PAIN MEDICATION USE AND EXPECTATIONS  You have likely been given narcotic medications to help control your pain.  After a traumatic event that results in an fracture (broken bone) with or without surgery, it is ok to use narcotic pain medications to help control one's pain.  We understand that everyone responds to pain differently and each individual patient will be evaluated on a regular basis for the continued need for narcotic medications. Ideally, narcotic medication use should last no more than 6-8 weeks (coinciding with fracture healing).   As a patient it is your responsibility as well to monitor narcotic medication use and report the amount and frequency you use these medications when you come to your office visit.   We would also advise that if you are using narcotic medications, you should take a dose prior to therapy to maximize you participation.  IF YOU ARE ON NARCOTIC MEDICATIONS IT IS NOT PERMISSIBLE TO OPERATE A MOTOR VEHICLE (MOTORCYCLE/CAR/TRUCK/MOPED) OR HEAVY MACHINERY DO NOT MIX NARCOTICS WITH OTHER CNS (CENTRAL NERVOUS SYSTEM) DEPRESSANTS SUCH AS ALCOHOL   STOP SMOKING OR USING NICOTINE  PRODUCTS!!!!  As discussed nicotine severely impairs your body's ability to heal surgical and traumatic wounds but also impairs bone healing.  Wounds and bone heal by forming microscopic blood vessels (angiogenesis) and nicotine is a vasoconstrictor (essentially, shrinks blood vessels).  Therefore, if vasoconstriction occurs to these microscopic blood vessels they essentially disappear and are unable to deliver necessary nutrients to the healing tissue.  This is one modifiable factor that you can do to dramatically increase your chances of healing your injury.    (This means no smoking, no nicotine gum, patches, etc)  DO NOT USE NONSTEROIDAL ANTI-INFLAMMATORY DRUGS (NSAID'S)  Using products such as Advil (ibuprofen), Aleve (naproxen), Motrin (ibuprofen) for additional pain control during fracture healing can delay and/or prevent the healing response.  If you would like to take over the counter (OTC) medication, Tylenol (acetaminophen) is ok.  However, some narcotic medications that are given for pain control contain acetaminophen as well. Therefore, you should not exceed more than 4000 mg of tylenol in a day if you do not have liver disease.  Also note that there are may OTC medicines, such as cold medicines and allergy medicines that my contain tylenol as well.  If you have any questions about medications and/or interactions please ask your doctor/PA or your pharmacist.      ICE AND ELEVATE INJURED/OPERATIVE EXTREMITY  Using ice and elevating the injured extremity above your heart can help with swelling and pain control.  Icing in a pulsatile fashion, such as 20 minutes on and 20 minutes off, can be followed.    Do not place ice directly on skin. Make sure there is a barrier between  to skin and the ice pack.    Using frozen items such as frozen peas works well as the conform nicely to the are that needs to be iced.  USE AN ACE WRAP OR TED HOSE FOR SWELLING CONTROL  In addition to icing and elevation,  Ace wraps or TED hose are used to help limit and resolve swelling.  It is recommended to use Ace wraps or TED hose until you are informed to stop.    When using Ace Wraps start the wrapping distally (farthest away from the body) and wrap proximally (closer to the body)   Example: If you had surgery on your leg or thing and you do not have a splint on, start the ace wrap at the toes and work your way up to the thigh        If you had surgery on your upper extremity and do not have a splint on, start the ace wrap at your fingers and work your way up to the upper arm  IF YOU ARE IN A SPLINT OR CAST DO NOT [place]   If your splint gets wet for any reason please contact the office immediately. You may shower in your splint or cast as long as you keep it dry.  This can be done by wrapping in a cast cover or garbage back (or similar)  Do Not stick any thing down your splint or cast such as pencils, money, or hangers to try and scratch yourself with.  If you feel itchy take benadryl as prescribed on the bottle for itching  IF YOU ARE IN A CAM BOOT (BLACK BOOT)  You may remove boot periodically. Perform daily dressing changes as noted below.  Wash the liner of the boot regularly and wear a sock when wearing the boot. It is recommended that you sleep in the boot until told otherwise    Call office for the following:  Temperature greater than 101F  Persistent nausea and vomiting  Severe uncontrolled pain  Redness, tenderness, or signs of infection (pain, swelling, redness, odor or green/yellow discharge around the site)  Difficulty breathing, headache or visual disturbances  Hives  Persistent dizziness or light-headedness  Extreme fatigue  Any other questions or concerns you may have after discharge  In an emergency, call 911 or go to an Emergency Department at a nearby hospital  HELPFUL INFORMATION  ? If you had a block, it will wear off between 8-24 hrs postop typically.   This is period when your pain may go from nearly zero to the pain you would have had postop without the block.  This is an abrupt transition but nothing dangerous is happening.  You may take an extra dose of narcotic when this happens.  ? You should wean off your narcotic medicines as soon as you are able.  Most patients will be off or using minimal narcotics before their first postop appointment.   ? We suggest you use the pain medication the first night prior to going to bed, in order to ease any pain when the anesthesia wears off. You should avoid taking pain medications on an empty stomach as it will make you nauseous.  ? Do not drink alcoholic beverages or take illicit drugs when taking pain medications.  ? In most states it is against the law to drive while you are in a splint or sling.  And certainly against the law to drive while taking narcotics.  ? You may return  to work/school in the next couple of days when you feel up to it.   ? Pain medication may make you constipated.  Below are a few solutions to try in this order: - Decrease the amount of pain medication if you aren't having pain. - Drink lots of decaffeinated fluids. - Drink prune juice and/or each dried prunes  o If the first 3 don't work start with additional solutions - Take Colace - an over-the-counter stool softener - Take Senokot - an over-the-counter laxative - Take Miralax - a stronger over-the-counter laxative     CALL THE OFFICE WITH ANY QUESTIONS OR CONCERNS: [phone]   VISIT OUR WEBSITE FOR ADDITIONAL INFORMATION: [email or web address]      Discharge Pin Site Instructions  Dress pins daily with Kerlix roll starting on POD 2. Wrap the Kerlix so that it tamps the skin down around the pin-skin interface to prevent/limit motion of the skin relative to the pin.  (Pin-skin motion is the primary cause of pain and infection related to external fixator pin sites).  Remove any crust or coagulum that may  obstruct drainage with soap and water.  After POD 3, if there is no discernable drainage on the pin site dressing, the interval for change can by increased to every other day.  You may shower with the fixator, cleaning all pin sites gently with soap and water.  If you have a surgical wound this needs to be completely dry and without drainage before showering. Alternatively you can use a washcloth with soap and water and gently clean the injured extremity and external fixator, including all pinsites and surgical wounds   The extremity can be lifted by the fixator to facilitate wound care and transfers.  Notify the office/Doctor if you experience increasing drainage, redness, or pain from a pin site, or if you notice purulent (thick, snot-like) drainage. As we discussed pin tract infections are common in this is most likely as a result of mechanical irritation from the skin pin interface. Primary treatment is hygiene and cleaning with soap and water. If this does not resolve with regular cleaning contact the office  Discharge Wound Care Instructions  Do NOT apply any ointments, solutions or lotions to pin sites or surgical wounds.  These prevent needed drainage and even though solutions like hydrogen peroxide kill bacteria, they also damage cells lining the pin sites that help fight infection.  Applying lotions or ointments can keep the wounds moist and can cause them to breakdown and open up as well. This can increase the risk for infection. When in doubt call the office.  Surgical incisions should be dressed daily.  If any drainage is noted, use one layer of adaptic, then gauze, Kerlix, and an ace wrap.  Once the incision is completely dry and without drainage, it may be left open to air out.  Showering may begin 36-48 hours later.  Cleaning gently with soap and water.  Traumatic wounds should be dressed daily as well.    One layer of adaptic, gauze, Kerlix, then ace wrap.  The adaptic can be  discontinued once the draining has ceased    If you have a wet to dry dressing: wet the gauze with saline the squeeze as much saline out so the gauze is moist (not soaking wet), place moistened gauze over wound, then place a dry gauze over the moist one, followed by Kerlix wrap, then ace wrap.    Do not take naproxen (naprosyn), ibuprofen (Advil) or aspirin more than once a  week while on apixaban due to increase bleeding risk   Information on my medicine - ELIQUIS (apixaban)  This medication education was reviewed with me or my healthcare representative as part of my discharge preparation.    Why was Eliquis prescribed for you? Eliquis was prescribed for you to reduce the risk of a blood clot forming that can cause a stroke if you have a medical condition called atrial fibrillation (a type of irregular heartbeat).  What do You need to know about Eliquis ? Take your Eliquis TWICE DAILY - one tablet in the morning and one tablet in the evening with or without food. If you have difficulty swallowing the tablet whole please discuss with your pharmacist how to take the medication safely.  Take Eliquis exactly as prescribed by your doctor and DO NOT stop taking Eliquis without talking to the doctor who prescribed the medication.  Stopping may increase your risk of developing a stroke.  Refill your prescription before you run out.  After discharge, you should have regular check-up appointments with your healthcare provider that is prescribing your Eliquis.  In the future your dose may need to be changed if your kidney function or weight changes by a significant amount or as you get older.  What do you do if you miss a dose? If you miss a dose, take it as soon as you remember on the same day and resume taking twice daily.  Do not take more than one dose of ELIQUIS at the same time to make up a missed dose.  Important Safety Information A possible side effect of Eliquis is bleeding. You  should call your healthcare provider right away if you experience any of the following: ? Bleeding from an injury or your nose that does not stop. ? Unusual colored urine (red or dark brown) or unusual colored stools (red or black). ? Unusual bruising for unknown reasons. ? A serious fall or if you hit your head (even if there is no bleeding).  Some medicines may interact with Eliquis and might increase your risk of bleeding or clotting while on Eliquis. To help avoid this, consult your healthcare provider or pharmacist prior to using any new prescription or non-prescription medications, including herbals, vitamins, non-steroidal anti-inflammatory drugs (NSAIDs) and supplements.  This website has more information on Eliquis (apixaban): http://[email or web address].com/eliquis/home   Additional discharge instructions  Please get your medications reviewed and adjusted by your Primary MD.  Please request your Primary MD to go over all Hospital Tests and Procedure/Radiological results at the follow up, please get all Hospital records sent to your Prim MD by signing hospital release before you go home.  If you had Pneumonia of Lung problems at the Hospital: Please get a 2 view Chest X ray done in 6-8 weeks after hospital discharge or sooner if instructed by your Primary MD.  If you have Congestive Heart Failure: Please call your Cardiologist or Primary MD anytime you have any of the following symptoms:  1) 3 pound weight gain in 24 hours or 5 pounds in 1 week  2) shortness of breath, with or without a dry hacking cough  3) swelling in the hands, feet or stomach  4) if you have to sleep on extra pillows at night in order to breathe  Follow cardiac low salt diet and 1.5 lit/day fluid restriction.  If you have diabetes Accuchecks 4 times/day, Once in AM empty stomach and then before each meal. Log in all results and show them to your  primary doctor at your next visit. If any glucose reading is under  80 or above 300 call your primary MD immediately.  If you have Seizure/Convulsions/Epilepsy: Please do not drive, operate heavy machinery, participate in activities at heights or participate in high speed sports until you have seen by Primary MD or a Neurologist and advised to do so again.  If you had Gastrointestinal Bleeding: Please ask your Primary MD to check a complete blood count within one week of discharge or at your next visit. Your endoscopic/colonoscopic biopsies that are pending at the time of discharge, will also need to followed by your Primary MD.  Get Medicines reviewed and adjusted. Please take all your medications with you for your next visit with your Primary MD  Please request your Primary MD to go over all hospital tests and procedure/radiological results at the follow up, please ask your Primary MD to get all Hospital records sent to his/her office.  If you experience worsening of your admission symptoms, develop shortness of breath, life threatening emergency, suicidal or homicidal thoughts you must seek medical attention immediately by calling 911 or calling your MD immediately  if symptoms less severe.  You must read complete instructions/literature along with all the possible adverse reactions/side effects for all the Medicines you take and that have been prescribed to you. Take any new Medicines after you have completely understood and accpet all the possible adverse reactions/side effects.   Do not drive or operate heavy machinery when taking Pain medications.   Do not take more than prescribed Pain, Sleep and Anxiety Medications  Special Instructions: If you have smoked or chewed Tobacco  in the last 2 yrs please stop smoking, stop any regular Alcohol  and or any Recreational drug use.  Wear Seat belts while driving.  Please note You were cared for by a hospitalist during your hospital stay. If you have any questions about your discharge medications or the care  you received while you were in the hospital after you are discharged, you can call the unit and asked to speak with the hospitalist on call if the hospitalist that took care of you is not available. Once you are discharged, your primary care physician will handle any further medical issues. Please note that NO REFILLS for any discharge medications will be authorized once you are discharged, as it is imperative that you return to your primary care physician (or establish a relationship with a primary care physician if you do not have one) for your aftercare needs so that they can reassess your need for medications and monitor your lab values.  You can reach the hospitalist office at phone [phone] or fax [phone]   If you do not have a primary care physician, you can call [phone] for a physician referral.

## 2020-01-31 NOTE — Progress Notes (Signed)
Received pt from PACU, accompanied by staff. Pt alert in no apparent distress. Surgical site with ace wrap CDI. Pt run afib on the cardiac monitor.

## 2020-01-31 NOTE — Transfer of Care (Signed)
Immediate Anesthesia Transfer of Care Note  Patient: David Fitzgerald  Procedure(s) Performed: OPEN REDUCTION INTERNAL FIXATION (ORIF) ANKLE FRACTURE (Left Ankle) IRRIGATION AND DEBRIDEMENT ANKLE (Left )  Patient Location: PACU  Anesthesia Type:General and Regional  Level of Consciousness: oriented and drowsy  Airway & Oxygen Therapy: Patient Spontanous Breathing and Patient connected to face mask oxygen  Post-op Assessment: Report given to RN  Post vital signs: Reviewed and stable  Last Vitals:  Vitals Value Taken Time  BP    Temp    Pulse    Resp    SpO2      Last Pain:  Vitals:   01/31/20 0939  TempSrc: Oral  PainSc:          Complications: No complications documented.

## 2020-01-31 NOTE — Progress Notes (Signed)
Pt. Refused cpap at this time. Will let us know if he changes his mind.

## 2020-01-31 NOTE — Consult Note (Signed)
Orthopaedic Trauma Service Consultation  Reason for Consult:Open left ankle fracture Referring Physician: Fredonia Highland, MD  David Fitzgerald is an 59 y.o. male.  HPI: Patient fell and cracked ankle. With neuropathy continued walking and then felt pop with acute loss of ambulation, deformity, and bleeding. Denies pain. Given the complexity of this open fracture in the setting of significant soft tissue concerns and DM, Dr. Percell Miller asserted this would be in the best interest of the patient to have these injuries evaluated and treated by a fellowship trained orthopaedic traumatologist. Consequently, I was consulted to provide further evaluation and management.   Past Medical History:  Diagnosis Date  . Allergy to pollen 04/06/2015  . Anxiety 02/06/2017  . Arthritis 02/06/2017  . Bee sting allergy    wasp / mixed vespid  . Bell's palsy 02/24/2013  . Chronic venous insufficiency 12/14/2014  . Diabetes ([place])   . Edema 12/14/2014  . Hyperlipidemia, mixed 12/14/2014  . Hypertension   . Lumbar pain with radiation down left leg 07/09/2012  . Morbid obesity ([place]) 12/14/2014  . Morbid obesity with BMI of 45.0-49.9, adult ([place]) 07/09/2012  . Obesity hypoventilation syndrome ([place]) 12/14/2014  . OSA treated with BiPAP 12/14/2014  . Pain syndrome, chronic 07/09/2012  . Pernicious anemia 02/06/2017  . Polyneuropathy in diabetes ([place]) 02/24/2013  . Syndrome affecting cervical region 02/24/2013  . Trigeminal neuralgia 02/24/2013  . Type 2 diabetes mellitus with diabetic neuropathy ([place]) 12/14/2014    Past Surgical History:  Procedure Laterality Date  . ADENOIDECTOMY    . BACK SURGERY  2002 /2003  . CARPAL TUNNEL RELEASE Bilateral   . KNEE SURGERY Left 2005  . SHOULDER SURGERY Left 1998  . TONSILLECTOMY      Family History  Problem Relation Age of Onset  . Breast cancer Mother   . Heart disease Father   . CAD Father   . Atrial fibrillation Father   . Stomach cancer Paternal Aunt   . Heart disease Paternal Uncle    . Breast cancer Maternal Grandmother     Social History:  reports that he quit smoking about 13 years ago. His smoking use included cigarettes. He has a 105.00 pack-year smoking history. He has never used smokeless tobacco. He reports that he does not drink alcohol and does not use drugs.  Allergies:  Allergies  Allergen Reactions  . Penicillins Swelling  . Aspirin     Relative contraindication due to hematochezia while on it  . Bee Venom     Medications:  Prior to Admission:  Medications Prior to Admission  Medication Sig Dispense Refill Last Dose  . Cyanocobalamin (VITAMIN B 12 PO) Take 2,500 mcg by mouth daily.   01/30/2020 at Unknown time  . dicyclomine (BENTYL) 20 MG tablet Take 1 tablet by mouth 2 (two) times daily.   01/30/2020 at Unknown time  . DULoxetine (CYMBALTA) 60 MG capsule Take 60 mg by mouth daily.    01/30/2020 at Unknown time  . ELIQUIS 5 MG TABS tablet TAKE 1 TABLET BY MOUTH TWICE DAILY (Patient taking differently: Take 5 mg by mouth 2 (two) times daily.) 180 tablet 1 01/30/2020 at 1900  . EPINEPHrine 0.3 mg/0.3 mL IJ SOAJ injection Use for life-threatening allergic reactions 4 each 3 unk  . furosemide (LASIX) 40 MG tablet Take 40 mg by mouth daily as needed for fluid or edema.   unk  . gabapentin (NEURONTIN) 300 MG capsule Take 600 mg by mouth 3 (three) times daily.    01/30/2020  at Unknown time  . metFORMIN (GLUCOPHAGE) 500 MG tablet Take 500 mg by mouth daily with breakfast.   01/30/2020 at Unknown time  . metoprolol tartrate (LOPRESSOR) 25 MG tablet Take 1 tablet (25 mg total) by mouth 3 (three) times daily. (Patient taking differently: Take 25-50 mg by mouth See admin instructions. 50 mg in the am and 25mg   at bedtime.) 270 tablet 0 01/30/2020 at 1900  . MIXED VESPID VENOM PROTEIN [place] Inject into the skin.   01/10/2020  . naloxone (NARCAN) nasal spray 4 mg/0.1 mL Place 1 spray into the nose See admin instructions. USE 1 SPRAY NASALLY AS NEEDED FOR OPIOID OVERDOSE  EMERGENCY   unk  . naproxen sodium (ALEVE) 220 MG tablet Take 440 mg by mouth daily as needed (headache).   01/30/2020 at Unknown time  . nitroGLYCERIN (NITROSTAT) 0.4 MG SL tablet Place 0.4 mg under the tongue every 5 (five) minutes as needed. Reported on 04/26/2015   unk  . ondansetron (ZOFRAN-ODT) 4 MG disintegrating tablet Take 4 mg by mouth every 8 (eight) hours as needed for nausea or vomiting.   unk  . oxyCODONE-acetaminophen (PERCOCET) 7.5-325 MG tablet Take 1 tablet by mouth every 8 (eight) hours as needed for severe pain.   01/30/2020 at Unknown time  . pantoprazole (PROTONIX) 40 MG tablet Take 40 mg by mouth daily.   01/30/2020 at Unknown time  . simvastatin (ZOCOR) 20 MG tablet Take 20 mg by mouth at bedtime.   01/30/2020 at Unknown time  . tamsulosin (FLOMAX) 0.4 MG CAPS capsule Take 0.4 mg by mouth at bedtime.   01/30/2020 at Unknown time  . VENOMIL WASP VENOM IJ Inject as directed.   01/10/2020    Results for orders placed or performed during the hospital encounter of 01/31/20 (from the past 48 hour(s))  Resp Panel by RT-PCR (Flu A&B, Covid) Nasopharyngeal Swab     Status: None   Collection Time: 01/31/20  3:15 AM   Specimen: Nasopharyngeal Swab; Nasopharyngeal(NP) swabs in vial transport medium  Result Value Ref Range   SARS Coronavirus 2 by RT PCR NEGATIVE NEGATIVE    Comment: (NOTE) SARS-CoV-2 target nucleic acids are NOT DETECTED.  The SARS-CoV-2 RNA is generally detectable in upper respiratory specimens during the acute phase of infection. The lowest concentration of SARS-CoV-2 viral copies this assay can detect is 138 copies/mL. A negative result does not preclude SARS-Cov-2 infection and should not be used as the sole basis for treatment or other patient management decisions. A negative result may occur with  improper specimen collection/handling, submission of specimen other than nasopharyngeal swab, presence of viral mutation(s) within the areas targeted by this  assay, and inadequate number of viral copies(<138 copies/mL). A negative result must be combined with clinical observations, patient history, and epidemiological information. The expected result is Negative.  Fact Sheet for Patients:  02/02/20  Fact Sheet for Healthcare Providers:  BloggerCourse.com  This test is no t yet approved or cleared by the [place] FDA and  has been authorized for detection and/or diagnosis of SARS-CoV-2 by FDA under an Emergency Use Authorization (EUA). This EUA will remain  in effect (meaning this test can be used) for the duration of the COVID-19 declaration under Section 564(b)(1) of the Act, 21 U.S.C.section 360bbb-3(b)(1), unless the authorization is terminated  or revoked sooner.       Influenza A by PCR NEGATIVE NEGATIVE   Influenza B by PCR NEGATIVE NEGATIVE    Comment: (NOTE) The Xpert Xpress SARS-CoV-2/FLU/RSV plus  assay is intended as an aid in the diagnosis of influenza from Nasopharyngeal swab specimens and should not be used as a sole basis for treatment. Nasal washings and aspirates are unacceptable for Xpert Xpress SARS-CoV-2/FLU/RSV testing.  Fact Sheet for Patients: BloggerCourse.com  Fact Sheet for Healthcare Providers: SeriousBroker.it  This test is not yet approved or cleared by the [place] FDA and has been authorized for detection and/or diagnosis of SARS-CoV-2 by FDA under an Emergency Use Authorization (EUA). This EUA will remain in effect (meaning this test can be used) for the duration of the COVID-19 declaration under Section 564(b)(1) of the Act, 21 U.S.C. section 360bbb-3(b)(1), unless the authorization is terminated or revoked.  Performed at [facility], [address] [place], [place] [address]   Comprehensive metabolic panel     Status: Abnormal   Collection Time: 01/31/20  3:20 AM   Result Value Ref Range   Sodium 137 135 - 145 mmol/L   Potassium 3.8 3.5 - 5.1 mmol/L   Chloride 100 98 - 111 mmol/L   CO2 26 22 - 32 mmol/L   Glucose, Bld 165 (H) 70 - 99 mg/dL    Comment: Glucose reference range applies only to samples taken after fasting for at least 8 hours.   BUN 11 6 - 20 mg/dL   Creatinine, Ser 0.09 0.61 - 1.24 mg/dL   Calcium 8.9 8.9 - 38.1 mg/dL   Total Protein 7.2 6.5 - 8.1 g/dL   Albumin 3.4 (L) 3.5 - 5.0 g/dL   AST 29 15 - 41 U/L   ALT 24 0 - 44 U/L   Alkaline Phosphatase 66 38 - 126 U/L   Total Bilirubin 0.7 0.3 - 1.2 mg/dL   GFR, Estimated >82 >99 mL/min    Comment: (NOTE) Calculated using the CKD-EPI Creatinine Equation (2021)    Anion gap 11 5 - 15    Comment: Performed at [facility], [address] [place], [place] [address]  CBC     Status: None   Collection Time: 01/31/20  3:20 AM  Result Value Ref Range   WBC 9.2 4.0 - 10.5 K/uL   RBC 4.73 4.22 - 5.81 MIL/uL   Hemoglobin 14.6 13.0 - 17.0 g/dL   HCT 67.8 93.8 - 10.1 %   MCV 92.2 80.0 - 100.0 fL   MCH 30.9 26.0 - 34.0 pg   MCHC 33.5 30.0 - 36.0 g/dL   RDW 75.1 02.5 - 85.2 %   Platelets 213 150 - 400 K/uL   nRBC 0.0 0.0 - 0.2 %    Comment: Performed at [facility], [address], [place], [place] [address]  Ethanol     Status: None   Collection Time: 01/31/20  3:20 AM  Result Value Ref Range   Alcohol, Ethyl (B) <10 <10 mg/dL    Comment: (NOTE) Lowest detectable limit for serum alcohol is 10 mg/dL.  For medical purposes only. Performed at [facility], [address], [place], [place] [address]   Lactic acid, plasma     Status: Abnormal   Collection Time: 01/31/20  3:20 AM  Result Value Ref Range   Lactic Acid, Venous 2.6 (HH) 0.5 - 1.9 mmol/L    Comment: CRITICAL RESULT CALLED TO, READ BACK BY AND VERIFIED WITH: Gladstone Lighter RN [id number] [phone] Myra Gianotti Performed at [facility], [address], [place], [place] [address]   Protime-INR     Status:  None  Collection Time: 01/31/20  3:20 AM  Result Value Ref Range   Prothrombin Time 14.0 11.4 - 15.2 seconds   INR 1.1 0.8 - 1.2    Comment: (NOTE) INR goal varies based on device and disease states. Performed at [facility], [place] [address], [place], [place] [address]   Sample to Blood Bank     Status: None   Collection Time: 01/31/20  3:20 AM  Result Value Ref Range   Blood Bank Specimen SAMPLE AVAILABLE FOR TESTING    Sample Expiration      02/01/2020,2359 Performed at [facility], [place] [address] [place], [place] [address]   I-Stat Chem 8, ED     Status: Abnormal   Collection Time: 01/31/20  3:29 AM  Result Value Ref Range   Sodium 138 135 - 145 mmol/L   Potassium 3.8 3.5 - 5.1 mmol/L   Chloride 99 98 - 111 mmol/L   BUN 13 6 - 20 mg/dL   Creatinine, Ser 1.00 0.61 - 1.24 mg/dL   Glucose, Bld 155 (H) 70 - 99 mg/dL    Comment: Glucose reference range applies only to samples taken after fasting for at least 8 hours.   Calcium, Ion 1.10 (L) 1.15 - 1.40 mmol/L   TCO2 25 22 - 32 mmol/L   Hemoglobin 15.0 13.0 - 17.0 g/dL   HCT 44.0 39.0 - 52.0 %  Glucose, capillary     Status: Abnormal   Collection Time: 01/31/20 11:47 AM  Result Value Ref Range   Glucose-Capillary 121 (H) 70 - 99 mg/dL    Comment: Glucose reference range applies only to samples taken after fasting for at least 8 hours.    DG Ankle 2 Views Left  Result Date: 01/31/2020 CLINICAL DATA:  Status post trauma. EXAM: LEFT ANKLE - 2 VIEW COMPARISON:  January 25, 2008 FINDINGS: The left ankle was imaged in a fiberglass cast with partially obscured osseous and soft tissue detail. An acute fracture deformity is seen extending through the left lateral malleolus. Approximately 1 shaft width lateral displacement of the distal fracture site is seen. A 1.0 cm cortical density of indeterminate age is seen adjacent to the dorsal aspect of the distal left talus. There is mild lateral dislocation of the left ankle.  Moderate severity soft tissue swelling is seen, most prominent along the medial aspect of the left ankle. Mild to moderate severity anterior lateral soft tissue air is also seen. IMPRESSION: 1. Acute fracture of the left lateral malleolus. 2. Mild lateral dislocation of the left ankle. 3. Findings likely consistent with a small fracture of indeterminate age originating from the distal left talus. Electronically Signed   By: Virgina Norfolk M.D.   On: 01/31/2020 03:35   CT Head Wo Contrast  Result Date: 01/31/2020 CLINICAL DATA:  Poly trauma. EXAM: CT HEAD WITHOUT CONTRAST TECHNIQUE: Contiguous axial images were obtained from the base of the skull through the vertex without intravenous contrast. COMPARISON:  CT head without contrast 12/16/2011 FINDINGS: Brain: Mild diffuse white matter disease is present. No acute infarct, hemorrhage, or mass lesion is present. Basal ganglia are intact. No acute or focal cortical abnormalities are present. No significant extraaxial fluid collection is present. The ventricles are of normal size. The brainstem and cerebellum are within normal limits. Vascular: Atherosclerotic calcifications are present within the cavernous internal carotid arteries bilaterally. No hyperdense vessel is present. Skull: No significant extracranial soft tissue injury is present. Calvarium is intact. No focal lytic or blastic lesions  are present. Sinuses/Orbits: Chronic opacification of the left maxillary sinus noted. Scattered mucosal thickening is present throughout the anterior ethmoid air cells and inferior frontal sinuses bilaterally. Posterior ethmoid air cells and sphenoid sinuses are clear. The mastoid air cells are clear. The globes and orbits are within normal limits. IMPRESSION: 1. No acute intracranial abnormality or significant interval change. 2. Mild diffuse white matter disease likely reflects the sequela of chronic microvascular ischemia. 3. Chronic left maxillary sinus disease.  Electronically Signed   By: San Morelle M.D.   On: 01/31/2020 03:58   CT ANGIO LOW EXTREM LEFT W &/OR WO CONTRAST  Addendum Date: 01/31/2020   ADDENDUM REPORT: 01/31/2020 04:54 ADDENDUM: Salient findings discussed by telephone with Dr. Delora Fuel on [id number] at 0445 hours. Electronically Signed   By: Genevie Ann M.D.   On: 01/31/2020 04:54   Result Date: 01/31/2020 CLINICAL DATA:  59 year old male status post fall at 0130 hours. On Eliquis. Compound fracture left ankle with visible bone. EXAM: CT ANGIOGRAPHY OF ILIOFEMORAL RUNOFF, CT ANGIOGRAPHY LOWER LEFT EXTREMITY, CT ANGIOGRAPHY LOWER RIGHT EXTREMITY TECHNIQUE: Multidetector CT imaging of the abdomen, pelvis and lower extremities was performed using the standard protocol during bolus administration of intravenous contrast. Multiplanar CT image reconstructions and MIPs were obtained to evaluate the vascular anatomy. CONTRAST:  135mL OMNIPAQUE IOHEXOL 350 MG/ML SOLN COMPARISON:  Left ankle radiographs 0319 hours. [facility] CT Abdomen and Pelvis 04/27/2019. FINDINGS: VASCULAR Aorta: Patent distal aorta with atherosclerosis. IMA origin is included and patent. Iliac arteries: Patent bilateral iliac arteries with calcified atherosclerosis. RIGHT Lower Extremity Patent right femoral arteries with mild calcified atherosclerosis. No hemodynamically significant stenosis. Normal right popliteal artery. Runoff: Three-vessel, with no atherosclerosis or stenosis evident. LEFT Lower Extremity Patent left femoral arteries with mild atherosclerosis, most apparent in the distal left SFA (calcified plaque series 5, image 169). No femoral artery stenosis identified. Patent left popliteal artery with mild soft plaque versus mixing artifact on series 5, image 219. No significant stenosis. Runoff: 3 vessel left lower extremity runoff with mild calcified plaque. No discrete anterior or posterior tibial artery injury is identified. Symmetric enhancement of the  dorsalis pedis and distal posterior tibial arteries. However, possible small foci of contrast extravasation within hematoma located just posterior and superior to the medial malleolus on series 5, image 357. Additional punctate bone fragments in this region. Regional subcutaneous hematoma measures 3-4 cm. See left ankle fracture details below. Review of the MIP images confirms the above findings. NON-VASCULAR Hepatobiliary: Minimally included right lower liver tip, negative. Pancreas: Minimally included uncinate, negative. Spleen: Not included. Adrenals/Urinary Tract: Visible kidneys appears stable since March and negative. No hydroureter. Diminutive, unremarkable urinary bladder. Stomach/Bowel: Nondilated visible large and small bowel. Normal retrocecal appendix (series 5, image 22). Diverticulosis of the visible right colon. No free air, free fluid or mesenteric stranding is visible. Lymphatic: No lymphadenopathy in the lower abdomen or pelvis. Reproductive: Negative. Other: No pelvic free fluid. Musculoskeletal: Chronic lower lumbar decompression and fusion. No acute pelvic fracture identified. Both femurs appear intact. Evidence of comminuted but nondisplaced fracture of the lower left patella (series 9, image 64 and series 5, image 229. Small superimposed left knee joint effusion with fairly simple fluid density (series 5, image 216). Distal femur, proximal left tibia and proximal left fibula appear intact. Contralateral right knee appears intact. Right tib fib, right ankle, and right foot appear intact. Oblique mildly comminuted fracture distal left fibula metadiaphysis. Mildly comminuted fracture of the distal left tibia posterior malleolus. Those  fractures are minimally to mildly displaced. Additional avulsion at the tip of the medial malleolus (series 9, image 104). Acute versus chronic fracture fragments about the neck of the talus with gas in the talonavicular joint. Talar dome appears intact. Chronic  appearing fracture deformity of the left navicular adjacent to the joint space gas. Left calcaneus appears intact. No definite acute fracture elsewhere in the left foot. Soft tissue hematoma and gas bilaterally about the left ankle. Soft tissue gas tracks cephalad in the left tib fib toward the tibial tuberosity. The most confluent hematoma is posterior and overlying the medial malleolus. IMPRESSION: VASCULAR 1. No discrete lower extremity arterial injury is identified, but small foci of contrast extravasation are suspected within hematoma located superior to the left medial malleolus (series 5, image 357). 2. Otherwise symmetric bilateral lower extremity three-vessel runoff with mild atherosclerosis and no significant stenosis. 3. Mild distal aorta and bilateral iliofemoral artery atherosclerosis without significant stenosis. NON-VASCULAR 1. Comminuted acute fractures of the distal left fibula, posterior malleolus. More age indeterminate fracture fragments at the medial malleolus, neck of the talus. Associated soft tissue hematoma, and subcutaneous gas tracking up the left leg. 2. Comminuted minimally displaced fracture of the left patella. Small left knee joint effusion. 3. Stable, negative visible lower abdomen and pelvis. Electronically Signed: By: Genevie Ann M.D. On: 01/31/2020 04:44   CT ANGIO LOW EXTREM RIGHT W &/OR WO CONTRAST  Addendum Date: 01/31/2020   ADDENDUM REPORT: 01/31/2020 04:54 ADDENDUM: Salient findings discussed by telephone with Dr. Delora Fuel on [id number] at 0445 hours. Electronically Signed   By: Genevie Ann M.D.   On: 01/31/2020 04:54   Result Date: 01/31/2020 CLINICAL DATA:  59 year old male status post fall at 0130 hours. On Eliquis. Compound fracture left ankle with visible bone. EXAM: CT ANGIOGRAPHY OF ILIOFEMORAL RUNOFF, CT ANGIOGRAPHY LOWER LEFT EXTREMITY, CT ANGIOGRAPHY LOWER RIGHT EXTREMITY TECHNIQUE: Multidetector CT imaging of the abdomen, pelvis and lower extremities was  performed using the standard protocol during bolus administration of intravenous contrast. Multiplanar CT image reconstructions and MIPs were obtained to evaluate the vascular anatomy. CONTRAST:  151mL OMNIPAQUE IOHEXOL 350 MG/ML SOLN COMPARISON:  Left ankle radiographs 0319 hours. [facility] CT Abdomen and Pelvis 04/27/2019. FINDINGS: VASCULAR Aorta: Patent distal aorta with atherosclerosis. IMA origin is included and patent. Iliac arteries: Patent bilateral iliac arteries with calcified atherosclerosis. RIGHT Lower Extremity Patent right femoral arteries with mild calcified atherosclerosis. No hemodynamically significant stenosis. Normal right popliteal artery. Runoff: Three-vessel, with no atherosclerosis or stenosis evident. LEFT Lower Extremity Patent left femoral arteries with mild atherosclerosis, most apparent in the distal left SFA (calcified plaque series 5, image 169). No femoral artery stenosis identified. Patent left popliteal artery with mild soft plaque versus mixing artifact on series 5, image 219. No significant stenosis. Runoff: 3 vessel left lower extremity runoff with mild calcified plaque. No discrete anterior or posterior tibial artery injury is identified. Symmetric enhancement of the dorsalis pedis and distal posterior tibial arteries. However, possible small foci of contrast extravasation within hematoma located just posterior and superior to the medial malleolus on series 5, image 357. Additional punctate bone fragments in this region. Regional subcutaneous hematoma measures 3-4 cm. See left ankle fracture details below. Review of the MIP images confirms the above findings. NON-VASCULAR Hepatobiliary: Minimally included right lower liver tip, negative. Pancreas: Minimally included uncinate, negative. Spleen: Not included. Adrenals/Urinary Tract: Visible kidneys appears stable since March and negative. No hydroureter. Diminutive, unremarkable urinary bladder. Stomach/Bowel: Nondilated  visible large and  small bowel. Normal retrocecal appendix (series 5, image 22). Diverticulosis of the visible right colon. No free air, free fluid or mesenteric stranding is visible. Lymphatic: No lymphadenopathy in the lower abdomen or pelvis. Reproductive: Negative. Other: No pelvic free fluid. Musculoskeletal: Chronic lower lumbar decompression and fusion. No acute pelvic fracture identified. Both femurs appear intact. Evidence of comminuted but nondisplaced fracture of the lower left patella (series 9, image 64 and series 5, image 229. Small superimposed left knee joint effusion with fairly simple fluid density (series 5, image 216). Distal femur, proximal left tibia and proximal left fibula appear intact. Contralateral right knee appears intact. Right tib fib, right ankle, and right foot appear intact. Oblique mildly comminuted fracture distal left fibula metadiaphysis. Mildly comminuted fracture of the distal left tibia posterior malleolus. Those fractures are minimally to mildly displaced. Additional avulsion at the tip of the medial malleolus (series 9, image 104). Acute versus chronic fracture fragments about the neck of the talus with gas in the talonavicular joint. Talar dome appears intact. Chronic appearing fracture deformity of the left navicular adjacent to the joint space gas. Left calcaneus appears intact. No definite acute fracture elsewhere in the left foot. Soft tissue hematoma and gas bilaterally about the left ankle. Soft tissue gas tracks cephalad in the left tib fib toward the tibial tuberosity. The most confluent hematoma is posterior and overlying the medial malleolus. IMPRESSION: VASCULAR 1. No discrete lower extremity arterial injury is identified, but small foci of contrast extravasation are suspected within hematoma located superior to the left medial malleolus (series 5, image 357). 2. Otherwise symmetric bilateral lower extremity three-vessel runoff with mild atherosclerosis and no  significant stenosis. 3. Mild distal aorta and bilateral iliofemoral artery atherosclerosis without significant stenosis. NON-VASCULAR 1. Comminuted acute fractures of the distal left fibula, posterior malleolus. More age indeterminate fracture fragments at the medial malleolus, neck of the talus. Associated soft tissue hematoma, and subcutaneous gas tracking up the left leg. 2. Comminuted minimally displaced fracture of the left patella. Small left knee joint effusion. 3. Stable, negative visible lower abdomen and pelvis. Electronically Signed: By: Genevie Ann M.D. On: 01/31/2020 04:44   DG Chest Port 1 View  Result Date: 01/31/2020 CLINICAL DATA:  Preoperative respiratory exam EXAM: PORTABLE CHEST 1 VIEW COMPARISON:  CT 02/14/2017, radiograph 03/25/2016 FINDINGS: There are some chronically coarsened interstitial and bronchitic features though increased hazy interstitial opacities are present towards the lung bases with slight pulmonary vascular congestion. While these may be partially attributable to body habitus some early interstitial edema or atelectasis could present similarly in the absence of infectious symptoms. The cardiomediastinal contours are fairly prominent though may be accentuated by low volumes and the portable technique. No pneumothorax. No effusion. Telemetry leads overlie the chest. No acute osseous or soft tissue abnormality. IMPRESSION: Hazy interstitial opacities towards the lung bases with slight pulmonary vascular congestion. While these may be partially attributable to body habitus could reflect some mild interstitial edema or atelectasis in the absence of infectious symptoms. Electronically Signed   By: Lovena Le M.D.   On: 01/31/2020 03:51    ROS No recent fever, bleeding abnormalities, urologic dysfunction, GI problems, or weight gain.  Blood pressure (!) 157/66, pulse 95, temperature 98.2 F (36.8 C), temperature source Oral, resp. rate 18, height 6\' 2"  (1.88 m), weight (!)  170 kg, SpO2 95 %. Physical Exam NCAT Baseline afib LLE Dressing intact, clean, dry  Edema/ swelling controlled  Sens: DPN, SPN, TN intact  Motor: EHL, FHL,  and lessor toe ext and flex all intact grossly  Brisk cap refill, warm to touch  Assessment/Plan: Open left ankle fracture Afib--on Eliquis, followed by Dr. Para March cards Chronic venous stasis LLE nueropathy from back surgery, exacerbated by DM   I discussed with the patient the risks and benefits of surgery, including the possibility of infection, nerve injury, vessel injury, wound breakdown, arthritis, symptomatic hardware, DVT/ PE, loss of motion, malunion, nonunion, and need for further surgery among others.  We also specifically discussed the need to stage surgery because of the elevated risk of soft tissue breakdown that could lead to amputation.  He acknowledged these risks and wished to proceed.    Altamese Winchester, MD [facility], [facility] [phone]  01/31/2020  12:30 PM

## 2020-01-31 NOTE — Brief Op Note (Signed)
01/31/2020  5:00 PM  PATIENT:  David Fitzgerald  59 y.o. male  [phone]

## 2020-01-31 NOTE — Progress Notes (Signed)
Dr. Carola Frost notified of Eliquis

## 2020-02-01 ENCOUNTER — Inpatient Hospital Stay (HOSPITAL_COMMUNITY): Payer: Medicare HMO

## 2020-02-01 ENCOUNTER — Encounter (HOSPITAL_COMMUNITY): Payer: Self-pay | Admitting: Internal Medicine

## 2020-02-01 ENCOUNTER — Other Ambulatory Visit: Payer: Self-pay

## 2020-02-01 DIAGNOSIS — S82892B Other fracture of left lower leg, initial encounter for open fracture type I or II: Secondary | ICD-10-CM | POA: Diagnosis not present

## 2020-02-01 DIAGNOSIS — I4821 Permanent atrial fibrillation: Secondary | ICD-10-CM | POA: Diagnosis not present

## 2020-02-01 LAB — CBC
HCT: 40 % (ref 39.0–52.0)
Hemoglobin: 13.6 g/dL (ref 13.0–17.0)
MCH: 31.1 pg (ref 26.0–34.0)
MCHC: 34 g/dL (ref 30.0–36.0)
MCV: 91.3 fL (ref 80.0–100.0)
Platelets: 283 10*3/uL (ref 150–400)
RBC: 4.38 MIL/uL (ref 4.22–5.81)
RDW: 15.2 % (ref 11.5–15.5)
WBC: 14.7 10*3/uL — ABNORMAL HIGH (ref 4.0–10.5)
nRBC: 0 % (ref 0.0–0.2)

## 2020-02-01 LAB — BASIC METABOLIC PANEL
Anion gap: 12 (ref 5–15)
BUN: 12 mg/dL (ref 6–20)
CO2: 25 mmol/L (ref 22–32)
Calcium: 8.9 mg/dL (ref 8.9–10.3)
Chloride: 100 mmol/L (ref 98–111)
Creatinine, Ser: 1.26 mg/dL — ABNORMAL HIGH (ref 0.61–1.24)
GFR, Estimated: >60 mL/min (ref >60)
Glucose, Bld: 222 mg/dL — ABNORMAL HIGH (ref 70–99)
Potassium: 4 mmol/L (ref 3.5–5.1)
Sodium: 137 mmol/L (ref 135–145)

## 2020-02-01 LAB — LACTIC ACID, PLASMA
Lactic Acid, Venous: 2.6 mmol/L (ref 0.5–1.9)
Lactic Acid, Venous: 2.8 mmol/L (ref 0.5–1.9)

## 2020-02-01 MED ORDER — BISACODYL 10 MG RE SUPP: 10.0000 mg | Freq: Every day | RECTAL | Status: DC | PRN

## 2020-02-01 MED ORDER — SODIUM CHLORIDE 0.9 % IV SOLN: INTRAVENOUS | Status: DC

## 2020-02-01 MED ORDER — MAGNESIUM HYDROXIDE 400 MG/5ML PO SUSP
30.0000 mL | Freq: Every day | ORAL | Status: DC | PRN
  Filled 2020-02-01: qty 30

## 2020-02-01 MED ORDER — LABETALOL HCL 5 MG/ML IV SOLN: 20.0000 mg | Freq: Once | INTRAVENOUS | Status: DC

## 2020-02-01 MED ORDER — ACETAMINOPHEN 500 MG PO TABS
1000.0000 mg | ORAL_TABLET | Freq: Once | ORAL | Status: AC
  Administered 2020-02-01: 01:00:00 1000 mg via ORAL
  Filled 2020-02-01: qty 2

## 2020-02-01 MED ORDER — ONDANSETRON HCL 4 MG PO TABS: 4.0000 mg | ORAL_TABLET | Freq: Four times a day (QID) | ORAL | Status: DC | PRN

## 2020-02-01 MED ORDER — ZOLPIDEM TARTRATE 5 MG PO TABS: 5.0000 mg | ORAL_TABLET | Freq: Every evening | ORAL | Status: DC | PRN

## 2020-02-01 MED ORDER — CEFAZOLIN SODIUM-DEXTROSE 2-4 GM/100ML-% IV SOLN
2.0000 g | Freq: Three times a day (TID) | INTRAVENOUS | Status: AC
  Administered 2020-02-01 (×3): 2 g via INTRAVENOUS
  Filled 2020-02-01 (×3): qty 100

## 2020-02-01 MED ORDER — ACETAMINOPHEN 500 MG PO TABS
500.0000 mg | ORAL_TABLET | Freq: Four times a day (QID) | ORAL | Status: AC
  Administered 2020-02-01 (×3): 500 mg via ORAL
  Filled 2020-02-01 (×3): qty 1

## 2020-02-01 MED ORDER — TRAMADOL HCL 50 MG PO TABS
50.0000 mg | ORAL_TABLET | Freq: Four times a day (QID) | ORAL | Status: DC
  Administered 2020-02-01 – 2020-02-16 (×46): 50 mg via ORAL
  Filled 2020-02-01 (×50): qty 1

## 2020-02-01 MED ORDER — AMLODIPINE BESYLATE 10 MG PO TABS
10.0000 mg | ORAL_TABLET | Freq: Every day | ORAL | Status: AC
  Administered 2020-02-01: 02:00:00 10 mg via ORAL
  Filled 2020-02-01: qty 1

## 2020-02-01 MED ORDER — HYDROCODONE-ACETAMINOPHEN 5-325 MG PO TABS
1.0000 | ORAL_TABLET | ORAL | Status: DC | PRN
  Administered 2020-02-04: 2 via ORAL
  Filled 2020-02-01: qty 1
  Filled 2020-02-01: qty 2

## 2020-02-01 MED ORDER — DOCUSATE SODIUM 100 MG PO CAPS
100.0000 mg | ORAL_CAPSULE | Freq: Two times a day (BID) | ORAL | Status: DC
  Administered 2020-02-01 – 2020-02-16 (×28): 100 mg via ORAL
  Filled 2020-02-01 (×31): qty 1

## 2020-02-01 MED ORDER — ONDANSETRON HCL 4 MG/2ML IJ SOLN: 4.0000 mg | Freq: Four times a day (QID) | INTRAMUSCULAR | Status: DC | PRN

## 2020-02-01 MED ORDER — FLEET ENEMA 7-19 GM/118ML RE ENEM: 1.0000 | ENEMA | Freq: Once | RECTAL | Status: DC | PRN

## 2020-02-01 NOTE — Anesthesia Postprocedure Evaluation (Signed)
Anesthesia Post Note  Patient: David Fitzgerald  Procedure(s) Performed: OPEN REDUCTION INTERNAL FIXATION (ORIF) ANKLE FRACTURE (Left Ankle) IRRIGATION AND DEBRIDEMENT ANKLE (Left )     Patient location during evaluation: PACU Anesthesia Type: Regional Level of consciousness: sedated Pain management: pain level controlled Vital Signs Assessment: post-procedure vital signs reviewed and stable Respiratory status: spontaneous breathing and respiratory function stable Cardiovascular status: tachycardic (Pt remained in AF with RVR. ) Postop Assessment: no apparent nausea or vomiting Anesthetic complications: no Comments: Discussed pt with Dr. Carola Frost, he will consult hospitalist for management of pt's AF. Will hold patient in PACU until evaluated.   No complications documented.                 Royale Lennartz DANIEL

## 2020-02-01 NOTE — Evaluation (Signed)
Physical Therapy Evaluation Patient Details Name: David Fitzgerald MRN: [id number] DOB: March 12, 1960 Today's Date: 02/01/2020   History of Present Illness  David Fitzgerald is a 59 y.o. male with medical history significant of paroxysmal A. fib on Eliquis, IIDM, diabetic neuropathy, morbid obesity, BPH, venous insufficiency on as needed Lasix, presented with mechanical fall and left ankle fracture. Now s/p ORIF, NWB  Clinical Impression   Patient is s/p above surgery resulting in functional limitations due to the deficits listed below (see PT Problem List). Comes from home where he lives in a single level home with a ramped entrance, and tub/shower; Completely independent prior to this admission; Presents to PT with decr functional mobility, strict weight bearing restrictions which can effect activity tolerance and increase fall risk; Pt is very open to going to post-acute rehab to maximize independence and safety with mobility prior to getting home;  Patient will benefit from skilled PT to increase their independence and safety with mobility to allow discharge to the venue listed below.       Follow Up Recommendations SNF;Supervision/Assistance - 24 hour    Equipment Recommendations  Rolling walker with 5" wheels;3in1 (PT);Wheelchair (measurements PT);Wheelchair cushion (measurements PT) (all Bari size)    Recommendations for Other Services       Precautions / Restrictions Precautions Precautions: Fall Restrictions Weight Bearing Restrictions: Yes LLE Weight Bearing: Non weight bearing      Mobility  Bed Mobility Overal bed mobility: Independent             General bed mobility comments: Sitting EOB upon therapist's arrival; Pt indicated he had no trouble coming form supine to sitting    Transfers Overall transfer level: Needs assistance Equipment used: Rolling walker (2 wheeled) David Fitzgerald) Transfers: Sit to/from David Fitzgerald: David Fitzgerald  Fitzgerald Fitzgerald pivot transfers: David Fitzgerald       General transfer comment: Verbal and demo cues for hand placement and safety; Min assist to steady RW during rise; Able to keep L foot in the air during that transition; Min assist for steady and safety with "heel-toe" pivot "steps" to the recliner on pt's R side; good support on RW; close guard as well to ensure NWB LLE; overall managed the pivot well with RW  Ambulation/Gait             General Gait Details: Held taking steps today; difficulty with keeping LLE up and accepting full body weight onto his arms on RW to allow for RLE straight stepping  Stairs            Wheelchair Mobility    Modified Rankin (Stroke Patients Only)       Balance Overall balance assessment: Needs assistance   Sitting balance-[name]: Good       Standing balance-[name]: Poor                               Pertinent Vitals/Pain Pain Assessment: No/denies pain (Years of neuropathy)    Home Living Family/patient expects to be discharged to:: Private residence Living Arrangements: Alone Available Help at Discharge: Family;Available PRN/intermittently (Pt's mother lives next door) Type of Home: House Home Access: Ramped entrance     Home Layout: One level Home Equipment: Environmental consultant - 2 wheels;Walker - 4 wheels;Hand held shower head;Other (comment) (has a sliding board) Additional Comments: David Fitzgerald's home equipment was originally his wife's -- would need to verify optimal fit if we  use his existing equipment    Prior Function Level of Independence: Independent               Hand Dominance        Extremity/Trunk Assessment   Upper Extremity Assessment Upper Extremity Assessment: Defer to OT evaluation    Lower Extremity Assessment Lower Extremity Assessment: LLE deficits/detail LLE Deficits / Details: Hip and knee ROM and strength grossly WFL; lower leg, ankle and foot immobilized  in splint; able to actively wiggle toes; decr sensation toes, but that is chronic LLE Sensation: decreased light touch;history of peripheral neuropathy    Cervical / Trunk Assessment Cervical / Trunk Assessment: Other exceptions Cervical / Trunk Exceptions: Quite large body habitus  Communication   Communication: No difficulties  Cognition Arousal/Alertness: Awake/alert Behavior During Therapy: WFL for tasks assessed/performed Overall Cognitive Status: Within Functional Limits for tasks assessed                                 General Comments: Very pleasant and eager to get up      General Comments General comments (skin integrity, edema, etc.): session conducted on Room Air, and O2 sats remained greater than or equal to 88%; O2 sats 95% and  HR 88 end of session    Exercises     Assessment/Plan    PT Assessment Patient needs continued PT services  PT Problem List Decreased strength;Decreased range of motion;Decreased activity tolerance;Decreased balance;Decreased mobility;Decreased coordination;Decreased knowledge of use of DME;Decreased knowledge of precautions;Impaired sensation;Obesity;Decreased skin integrity       PT Treatment Interventions DME instruction;Gait training;Functional mobility training;Therapeutic activities;Therapeutic exercise;Balance training;Patient/family education;Wheelchair mobility training    PT Goals (Current goals can be found in the Care Plan section)  Acute Rehab PT Goals Patient Stated Goal: Wants to be as independent as possible when he gets home PT Goal Formulation: With patient Time For Goal Achievement: 02/15/20 Potential to Achieve Goals: Good    Frequency Min 2X/week   Barriers to discharge   [name] is concerned that his mother will try and do too much to care for him, and he is more than agreeable to going to SNF for post-acute rehab    Co-evaluation               AM-PAC PT "6 Clicks" Mobility  Outcome Measure  Help needed turning from your back to your side while in a flat bed without using bedrails?: None Help needed moving from lying on your back to sitting on the side of a flat bed without using bedrails?: None Help needed moving to and from a bed to a chair (including a wheelchair)?: A Little Help needed standing up from a chair using your arms (e.g., wheelchair or bedside chair)?: A Little Help needed to walk in hospital room?: A Lot Help needed climbing 3-5 steps with a railing? : Total 6 Click Score: 17    End of Session Equipment Utilized During Treatment: Gait belt Activity Tolerance: Patient tolerated treatment well Patient left: in chair;with call bell/phone within reach Nurse Communication: Mobility status;Other (comment) (Pt is able to help with [facility]) PT Visit Diagnosis: Unsteadiness on feet (R26.81);Other abnormalities of gait and mobility (R26.89);Other (comment) (Weight bearing restrictions)    Time: [id number] PT Time Calculation (min) (ACUTE ONLY): 42 min   Charges:   PT Evaluation $PT Eval Moderate Complexity: 1 Mod PT Treatments $Therapeutic Activity: 8-22 mins  Roney Marion, [place]  Acute Rehabilitation Services Pager [phone] Office [place] 02/01/2020, 11:59 AM

## 2020-02-01 NOTE — Plan of Care (Signed)

## 2020-02-01 NOTE — Progress Notes (Signed)
Patient ID: David Fitzgerald, male   DOB: July 06, 1960, 59 y.o.   MRN: [id number]   LOS: 1 day   Subjective: Doing well, denies pain.   Objective: Vital signs in last 24 hours: Temp:  [97.5 F (36.4 C)-98.7 F (37.1 C)] 98.3 F (36.8 C) (12/28 0800) Pulse Rate:  [92-135] 96 (12/28 0800) Resp:  [11-21] 18 (12/28 0800) BP: (118-175)/(65-128) 124/76 (12/28 0800) SpO2:  [92 %-96 %] 96 % (12/28 0800) Last BM Date: 01/31/20   Laboratory  CBC Recent Labs    01/31/20 0320 01/31/20 0329 02/01/20 0211  WBC 9.2  --  14.7*  HGB 14.6 15.0 13.6  HCT 43.6 44.0 40.0  PLT 213  --  283   BMET Recent Labs    01/31/20 0320 01/31/20 0329 02/01/20 0211  NA 137 138 137  K 3.8 3.8 4.0  CL 100 99 100  CO2 26  --  25  GLUCOSE 165* 155* 222*  BUN 11 13 12   CREATININE 1.12 1.00 1.26*  CALCIUM 8.9  --  8.9     Physical Exam General appearance: alert and no distress  LLE: Splint in place, cap refill <2s   Assessment/Plan: Left ankle fx s/p ORIF POD#1 -- NWB, f/u with Dr. as OP.    Carola Frost, PA-C Orthopedic Surgery [phone] 02/01/2020

## 2020-02-01 NOTE — Evaluation (Signed)
Occupational Therapy Evaluation Patient Details Name: David Fitzgerald MRN: [id number] DOB: Jul 01, 1960 Today's Date: 02/01/2020    History of Present Illness David Fitzgerald is a 59 y.o. male with medical history significant of paroxysmal A. fib on Eliquis, IIDM, diabetic neuropathy, morbid obesity, BPH, venous insufficiency on as needed Lasix, presented with mechanical fall and left ankle fracture. Now s/p ORIF, NWB   Clinical Impression   PTA, pt lives alone and reports complete Independence prior to injury. Pt presents now with no reports of pain (hx of neuropathy) and deficits in strength, endurance and standing balance. Pt overall Min A x 2 for safe sit to stand and pivot to bariatric chair using RW. Pt with good adherence to NWB precautions but difficulty clearing R foot for hopping during pivot. Pt overall Min A for UB ADLs and Max A for LB ADLs. OT located bariatric David Fitzgerald for pt use with plans to practice David Fitzgerald transfer during next session. Pt would also benefit from LB strategies education (including AE) and HEP training to maximize UB strength for mobility. Recommend SNF for short term rehab, in which pt agreeable.     Follow Up Recommendations  SNF;Supervision/Assistance - 24 hour    Equipment Recommendations  Wheelchair (measurements OT);Wheelchair cushion (measurements OT);Other (comment);Tub/shower bench (bariatric DME; RW)    Recommendations for Other Services       Precautions / Restrictions Precautions Precautions: Fall Restrictions Weight Bearing Restrictions: Yes LLE Weight Bearing: Non weight bearing      Mobility Bed Mobility Overal bed mobility: Independent             General bed mobility comments: Sitting EOB upon therapist's arrival; Pt indicated he had no trouble coming form supine to sitting    Transfers Overall transfer level: Needs assistance Equipment used: Rolling walker (2 wheeled) David Fitzgerald) Transfers: Sit to/from David Fitzgerald to  Stand: David Fitzgerald safety/equipment Stand pivot transfers: David Fitzgerald safety/equipment       General transfer comment: Verbal and demo cues for hand placement and safety; Min assist to steady RW during rise; Able to keep L foot in the air during that transition; Min assist for steady and safety with "heel-toe" pivot "steps" to the recliner on pt's R side; good support on RW; close guard as well to ensure NWB LLE; overall managed the pivot well with RW    Balance Overall balance assessment: Needs assistance Sitting-balance support: No upper extremity supported;Feet supported Sitting balance-David Fitzgerald: Good     Standing balance support: Bilateral upper extremity supported;During functional activity Standing balance-David Fitzgerald: Poor Standing balance comment: reliant on external support due to NWB                           ADL either performed or assessed with clinical judgement   ADL Overall ADL's : Needs assistance/impaired Eating/Feeding: Independent;Sitting   Grooming: Set up;Sitting   Upper Body Bathing: Minimal assistance;Sitting   Lower Body Bathing: Maximal assistance;Sit to/from stand;Sitting/lateral leans   Upper Body Dressing : Set up;Sitting   Lower Body Dressing: Maximal assistance;Sit to/from stand;Sitting/lateral leans Lower Body Dressing Details (indicate cue type and reason): Max A to don R sock. Pt reports typically propping feet up on bed, etc to don socks at home. Typically wears slip on shoes at home. Wears socks/tennis shoes outside of the home Toilet Transfer: Minimal assistance;+2 for physical assistance;+2 for safety/equipment;Stand-pivot;RW Toilet Transfer Details (indicate cue type and reason): simulated to recliner, able to demo  some hopping but mostly sliding unaffected LE on floor and pushing through [employer]- Clothing Manipulation and Hygiene: Moderate assistance;Sitting/lateral lean;Sit to/from stand         General ADL  Comments: Pt limited by WB status, body habitus and decreased endurance     Vision Patient Visual Report: No change from baseline Vision Assessment?: No apparent visual deficits     Perception     Praxis      Pertinent Vitals/Pain Pain Assessment: No/denies pain (neuropathy)     Hand Dominance Right   Extremity/Trunk Assessment Upper Extremity Assessment Upper Extremity Assessment: Overall WFL for tasks assessed   Lower Extremity Assessment Lower Extremity Assessment: Defer to PT evaluation LLE Deficits / Details: Hip and knee ROM and strength grossly WFL; lower leg, ankle and foot immobilized in splint; able to actively wiggle toes; decr sensation toes, but that is chronic LLE Sensation: decreased light touch;history of peripheral neuropathy   Cervical / Trunk Assessment Cervical / Trunk Assessment: Other exceptions Cervical / Trunk Exceptions: large body habitus   Communication Communication Communication: No difficulties   Cognition Arousal/Alertness: Awake/alert Behavior During Therapy: WFL for tasks assessed/performed Overall Cognitive Status: Within Functional Limits for tasks assessed                                 General Comments: Very pleasant and eager to get up   General Comments  VSS on RA. Educated and problem solved equipment and home setup. Pt interested in rehab to maximize independence and safety    Exercises     Shoulder Instructions      Home Living Family/patient expects to be discharged to:: Private residence Living Arrangements: Alone Available Help at Discharge: Family;Available PRN/intermittently (Pt's mother lives next door) Type of Home: House Home Access: Ramped entrance     Home Layout: One level     Bathroom Shower/Tub: Tub/shower unit;Curtain   Bathroom Toilet: Standard (vanity adjacent)     Home Equipment: Walker - 2 wheels;Walker - 4 wheels;Hand held shower head;Other (comment) (has a sliding board)    Additional Comments: David Fitzgerald's home equipment was originally his wife's -- would need to verify optimal fit if we use his existing equipment      Prior Functioning/Environment Level of Independence: Independent        Comments: No use of AD, independent. Was working on plumbing outside of home when fall occurred        OT Problem List: Decreased strength;Decreased activity tolerance;Impaired balance (sitting and/or standing);Decreased knowledge of use of DME or AE      OT Treatment/Interventions: Self-care/ADL training;Therapeutic exercise;Energy conservation;DME and/or AE instruction;Therapeutic activities;Patient/family education;Balance training    OT Goals(Current goals can be found in the care plan section) Acute Rehab OT Goals Patient Stated Goal: Wants to be as independent as possible when he gets home OT Goal Formulation: With patient Time For Goal Achievement: 02/15/20 Potential to Achieve Goals: Good ADL Goals Pt Will Perform Lower Body Bathing: with set-up;sitting/lateral leans;with adaptive equipment Pt Will Perform Lower Body Dressing: with set-up;sitting/lateral leans;sit to/from stand;with adaptive equipment Pt Will Transfer to Toilet: with set-up;bedside commode;stand pivot transfer Pt Will Perform Toileting - Clothing Manipulation and hygiene: with set-up;sitting/lateral leans;sit to/from stand Pt/caregiver will Perform Home Exercise Program: Increased strength;Both right and left upper extremity;With theraband;Independently;With written HEP provided  OT Frequency: Min 2X/week   Barriers to D/C:            Co-evaluation  PT/OT/SLP Co-Evaluation/Treatment: Yes Reason for Co-Treatment: For patient/therapist safety;To address functional/ADL transfers   OT goals addressed during session: ADL's and self-care      AM-PAC OT "6 Clicks" Daily Activity     Outcome Measure Help from another person eating meals?: None Help from another person taking care of personal  grooming?: A Little Help from another person toileting, which includes using toliet, bedpan, or urinal?: A Lot Help from another person bathing (including washing, rinsing, drying)?: A Lot Help from another person to put on and taking off regular upper body clothing?: A Little Help from another person to put on and taking off regular lower body clothing?: A Lot 6 Click Score: 16   End of Session Equipment Utilized During Treatment: Gait belt;Rolling walker Nurse Communication: Mobility status  Activity Tolerance: Patient tolerated treatment well Patient left: in chair;with call bell/phone within reach  OT Visit Diagnosis: Unsteadiness on feet (R26.81);Other abnormalities of gait and mobility (R26.89);Muscle weakness (generalized) (M62.81)                Time: 5910-2890 OT Time Calculation (min): 28 min Charges:  OT General Charges $OT Visit: 1 Visit OT Evaluation $OT Eval Moderate Complexity: 1 Mod  Lorre Munroe, OTR/L  Lorre Munroe 02/01/2020, 12:37 PM

## 2020-02-01 NOTE — Progress Notes (Signed)
PROGRESS NOTE    David Fitzgerald  [id number] DOB: [birth year] DOA: 01/31/2020 PCP: Angelina Sheriff, MD   Brief Narrative:  HPI per Dr. Wynetta Fines on 01/31/20  HPI: David Fitzgerald is a 59 y.o. male with medical history significant of paroxysmal A. fib on Eliquis, IIDM, diabetic neuropathy, morbid obesity, BPH, venous insufficiency on as needed Lasix, presented with mechanical fall and left ankle fracture.  Patient woke up at night, and tripped and fell on the left leg and ankle, he said he was a little unsteady but denied any prodromes of lightheadedness chest pain or shortness of breath.  Sustained a open fracture of left ankle and came to ED. ED Course: X-ray showed acute left malleolus fracture with mild dislocation of left ankle.  Patient shifted to the OR to have ORIF of left ankle.  In PACU, patient developed in and out uncontrolled A. fib, responded to IV Lopressor.  Blood pressure stable, elevated lactic acid 2.6, WBC WNL.  **Interim History  Patient's heart rate is improved and he is status post ORIF for his left ankle.  PT OT evaluating and recommending SNF  Assessment & Plan:   Active Problems:   Open fracture dislocation of left ankle   Ankle fracture   Permanent atrial fibrillation (HCC)   Chronic A Fib now with RVR -Patient appears to be hypovolemic, will give a short course 12 hours IV fluid; Now IVF resumed today and will c/w NS at 75 mL/hr -Rate control wise, responded to IV metoprolol in ED and PACU, discussed with surgeon and PACU nurse, patient appears to be stable to swallow and eat, will resume home metoprolol.  -PRN IV metoprolol 2.5 mg IV every 6 as needed for heart rate greater than 110 -Continue with metoprolol tartrate 25 mg p.o. nightly -Surgeon agreed to restart Eliquis and now back on Apixaban 5 mg po BID -Make K>4, and check magnesium level  Open fracture of left ankle -Status post ORIF -Antibiotics and postop care as per surgery team -Continue  with acetaminophen 325-650 mg p.o. every 6 as needed for mild pain, hydrocodone-acetaminophen 1-2 tabs p.o. every 4 as needed moderate pain, as well as hydromorphone IV 0.5-1 mg IV every 4 as needed for severe pain next-also continue with methocarbamol 500 g p.o./IV every 6 as needed for muscle spasms; patient also has oxycodone IR as well as tramadol and will need to consolidate his pain medications but will leave this to orthopedic surgery -Bowel regimen with bisacodyl 10 mg rectally, docusate 100 mg p.o. twice daily, and a Fleet enema -C/w Apixaban 5 mg po BID for VTE prophylaxis  Orthopedic recommending nonweightbearing and following up with Dr. Marcelino Scot as an outpatient-  Lactic Acidosis -Patient's lactic acid level went from 2.6 and trended up to 3.9 is now trending down to 2.6 -Continue IV fluid hydration with normal saline at 75 MLS per hour we will hold his Metformin -Continue monitor and trend lactic acid level and repeat in 3 hours  Leukocytosis -Likely in setting of his surgery -WBC went from 9.2 is now 14 point 7X-continue to monitor for signs and symptoms of infection; currently no overt infection noted -Continue monitor and trend and repeat CMP in the a.m.  Renal Insufficiency -Patient's BUNs/creatinine went from 13/1.00 and is now 12/1.26 -IV fluid hydration as above -Avoid nephrotoxic medications, contrast dyes, hypotension and renally dose medications -Continue monitor renal function closely and repeat CMP in a.m.  OSA -CPAP HS  IIDM -Because he has a  Lactic Acidosis will hold and stop Metformin for now  Hyperlipidemia -Continue simvastatin 20 g p.o. nightly  BPH -Continue Tamsulosin 0.4 mg p.o. nightly  GERD -Continue with Pantoprazole 40 mg p.o. daily  Diabetic Neuropathy  -Resume Gabapentin  Obesity -.Complicates overall prognosis and care -Estimated body mass index is 48.12 kg/m as calculated from the following:   Height as of this encounter: 6\' 2"   (1.88 m).   Weight as of this encounter: 170 kg. -Weight loss and Dietary Counseling given   DVT prophylaxis: Per Orthopedic Surgery  Code Status: FULL CODE  Family Communication: No family present at bedside  Disposition Plan: SNF when cleared by orthopedic surgery and bed is available  Status is: Inpatient  Remains inpatient appropriate because:Unsafe d/c plan, IV treatments appropriate due to intensity of illness or inability to take PO and Inpatient level of care appropriate due to severity of illness   Dispo: The patient is from: Home              Anticipated d/c is to: SNF              Anticipated d/c date is: 2 days              Patient currently is not medically stable to d/c.   Consultants:   Orthopedic Surgery    Procedures:  PROCEDURES: 1.  Open reduction internal fixation of left ankle trimalleolar fracture without fixation of the posterior lip. 2.  Open treatment of left ankle dislocation with K-wire fixation of the tibiotalar joint. 3.  Open reduction internal fixation of left ankle syndesmosis. 4.  Debridement of open fracture dislocation including skin, subcutaneous tissue, fascia and bone. 5.  Stress fluoroscopy, left ankle.  Antimicrobials: Anti-infectives (From admission, onward)   Start     Dose/Rate Route Frequency Ordered Stop   02/01/20 0100  ceFAZolin (ANCEF) IVPB 2g/100 mL premix        2 g 200 mL/hr over 30 Minutes Intravenous Every 8 hours 02/01/20 0019 02/02/20 0059   01/31/20 1543  vancomycin (VANCOCIN) powder  Status:  Discontinued          As needed 01/31/20 1544 01/31/20 1638   01/31/20 0430  clindamycin (CLEOCIN) IVPB 900 mg  Status:  Discontinued        900 mg 100 mL/hr over 30 Minutes Intravenous Every 8 hours 01/31/20 0408 02/01/20 0017   01/31/20 0315  ceFAZolin (ANCEF) IVPB 2g/100 mL premix        2 g 200 mL/hr over 30 Minutes Intravenous  Once 01/31/20 0308 01/31/20 0547       Subjective: Seen and examined at bedside and he is  doing okay.  Denied much pain.  Feels like his heart is doing better.  No nausea or vomiting.  No other concerns or complaints at this time.  Objective: Vitals:   02/01/20 0150 02/01/20 0214 02/01/20 0327 02/01/20 0800  BP: (!) 162/120 (!) 160/100 (!) 138/91 124/76  Pulse:   (!) 115 96  Resp:   16 18  Temp:   98.3 F (36.8 C) 98.3 F (36.8 C)  TempSrc:   Oral Oral  SpO2:   95% 96%  Weight:      Height:        Intake/Output Summary (Last 24 hours) at 02/01/2020 0826 Last data filed at 02/01/2020 0300 Gross per 24 hour  Intake 1457.3 ml  Output 3150 ml  Net -1692.7 ml   Filed Weights   01/31/20 0255 01/31/20 0308  Weight: (!) 170 kg (!) 170 kg   Examination: Physical Exam:  Constitutional: WN/WD obese Caucasian male currently no acute distress appears calm Eyes: Lids and conjunctivae normal, sclerae anicteric  ENMT: External Ears, Nose appear normal. Grossly normal hearing.  Neck: Appears normal, supple, no cervical masses, normal ROM, no appreciable thyromegaly; no JVD Respiratory: Diminished to auscultation bilaterally, no wheezing, rales, rhonchi or crackles. Normal respiratory effort and patient is not tachypenic. No accessory muscle use.  Unlabored breathing Cardiovascular: RRR, no murmurs / rubs / gallops. S1 and S2 auscultated.  Minimal extremity Abdomen: Soft, non-tender, distended secondary by habitus. Bowel sounds positive.  GU: Deferred. Musculoskeletal: No clubbing / cyanosis of digits/nails. No joint deformity upper and lower extremities but his left ankle is wrapped in Ace bandage.  Skin: No rashes, lesions, ulcers on limited skin evaluation. No induration; Warm and dry.  Neurologic: CN 2-12 grossly intact with no focal deficits. Romberg sign and cerebellar reflexes not assessed.  Psychiatric: Normal judgment and insight. Alert and oriented x 3. Normal mood and appropriate affect.   Data Reviewed: I have personally reviewed following labs and imaging  studies  CBC: Recent Labs  Lab 01/31/20 0320 01/31/20 0329 02/01/20 0211  WBC 9.2  --  14.7*  HGB 14.6 15.0 13.6  HCT 43.6 44.0 40.0  MCV 92.2  --  91.3  PLT 213  --  [id number]   Basic Metabolic Panel: Recent Labs  Lab 01/31/20 0320 01/31/20 0329 01/31/20 1912 02/01/20 0211  NA 137 138  --  137  K 3.8 3.8  --  4.0  CL 100 99  --  100  CO2 26  --   --  25  GLUCOSE 165* 155*  --  222*  BUN 11 13  --  12  CREATININE 1.12 1.00  --  1.26*  CALCIUM 8.9  --   --  8.9  MG  --   --  2.1  --   PHOS  --   --  2.9  --    GFR: Estimated Creatinine Clearance: 103.4 mL/min (A) (by C-G formula based on SCr of 1.26 mg/dL (H)). Liver Function Tests: Recent Labs  Lab 01/31/20 0320  AST 29  ALT 24  ALKPHOS 66  BILITOT 0.7  PROT 7.2  ALBUMIN 3.4*   No results for input(s): LIPASE, AMYLASE in the last 168 hours. No results for input(s): AMMONIA in the last 168 hours. Coagulation Profile: Recent Labs  Lab 01/31/20 0320  INR 1.1   Cardiac Enzymes: No results for input(s): CKTOTAL, CKMB, CKMBINDEX, TROPONINI in the last 168 hours. BNP (last 3 results) No results for input(s): PROBNP in the last 8760 hours. HbA1C: No results for input(s): HGBA1C in the last 72 hours. CBG: Recent Labs  Lab 01/31/20 1147 01/31/20 1643  GLUCAP 121* 173*   Lipid Profile: No results for input(s): CHOL, HDL, LDLCALC, TRIG, CHOLHDL, LDLDIRECT in the last 72 hours. Thyroid Function Tests: No results for input(s): TSH, T4TOTAL, FREET4, T3FREE, THYROIDAB in the last 72 hours. Anemia Panel: No results for input(s): VITAMINB12, FOLATE, FERRITIN, TIBC, IRON, RETICCTPCT in the last 72 hours. Sepsis Labs: Recent Labs  Lab 01/31/20 0320 01/31/20 1912 01/31/20 2156  LATICACIDVEN 2.6* 2.4* 3.9*    Recent Results (from the past 240 hour(s))  Resp Panel by RT-PCR (Flu A&B, Covid) Nasopharyngeal Swab     Status: None   Collection Time: 01/31/20  3:15 AM   Specimen: Nasopharyngeal Swab;  Nasopharyngeal(NP) swabs in vial transport medium  Result  Value Ref Range Status   SARS Coronavirus 2 by RT PCR NEGATIVE NEGATIVE Final    Comment: (NOTE) SARS-CoV-2 target nucleic acids are NOT DETECTED.  The SARS-CoV-2 RNA is generally detectable in upper respiratory specimens during the acute phase of infection. The lowest concentration of SARS-CoV-2 viral copies this assay can detect is 138 copies/mL. A negative result does not preclude SARS-Cov-2 infection and should not be used as the sole basis for treatment or other patient management decisions. A negative result may occur with  improper specimen collection/handling, submission of specimen other than nasopharyngeal swab, presence of viral mutation(s) within the areas targeted by this assay, and inadequate number of viral copies(<138 copies/mL). A negative result must be combined with clinical observations, patient history, and epidemiological information. The expected result is Negative.  Fact Sheet for Patients:  BloggerCourse.com  Fact Sheet for Healthcare Providers:  SeriousBroker.it  This test is no t yet approved or cleared by the [place] FDA and  has been authorized for detection and/or diagnosis of SARS-CoV-2 by FDA under an Emergency Use Authorization (EUA). This EUA will remain  in effect (meaning this test can be used) for the duration of the COVID-19 declaration under Section 564(b)(1) of the Act, 21 U.S.C.section 360bbb-3(b)(1), unless the authorization is terminated  or revoked sooner.       Influenza A by PCR NEGATIVE NEGATIVE Final   Influenza B by PCR NEGATIVE NEGATIVE Final    Comment: (NOTE) The Xpert Xpress SARS-CoV-2/FLU/RSV plus assay is intended as an aid in the diagnosis of influenza from Nasopharyngeal swab specimens and should not be used as a sole basis for treatment. Nasal washings and aspirates are unacceptable for Xpert Xpress  SARS-CoV-2/FLU/RSV testing.  Fact Sheet for Patients: BloggerCourse.com  Fact Sheet for Healthcare Providers: SeriousBroker.it  This test is not yet approved or cleared by the [place] FDA and has been authorized for detection and/or diagnosis of SARS-CoV-2 by FDA under an Emergency Use Authorization (EUA). This EUA will remain in effect (meaning this test can be used) for the duration of the COVID-19 declaration under Section 564(b)(1) of the Act, 21 U.S.C. section 360bbb-3(b)(1), unless the authorization is terminated or revoked.  Performed at [facility], [address] [place], [place] [address]   Surgical pcr screen     Status: None   Collection Time: 01/31/20 11:40 AM   Specimen: Nasal Mucosa; Nasal Swab  Result Value Ref Range Status   MRSA, PCR NEGATIVE NEGATIVE Final   Staphylococcus aureus NEGATIVE NEGATIVE Final    Comment: (NOTE) The Xpert SA Assay (FDA approved for NASAL specimens in patients 13 years of age and older), is one component of a comprehensive surveillance program. It is not intended to diagnose infection nor to guide or monitor treatment. Performed at [facility], [address], [place], [place] [address]     RN Pressure Injury Documentation:     Estimated body mass index is 48.12 kg/m as calculated from the following:   Height as of this encounter: 6\' 2"  (1.88 m).   Weight as of this encounter: 170 kg.  Malnutrition Type:    Malnutrition Characteristics:   Nutrition Interventions:     Radiology Studies: DG Ankle 2 Views Left  Result Date: 01/31/2020 CLINICAL DATA:  Left ankle ORIF. EXAM: LEFT ANKLE - 2 VIEW; DG C-ARM 1-60 MIN COMPARISON:  01/31/2020. FINDINGS: Fluoro time: 1 minutes and 19 seconds. Ten C-arm fluoroscopic images were obtained intraoperatively and submitted for post operative interpretation.  These images demonstrate ORIF of a lateral malleolar fracture  with plate and screws. Please see the performing provider's procedural report for further detail. IMPRESSION: Intraoperative fluoroscopic imaging, as described above. Electronically Signed   By: Margaretha Sheffield MD   On: 01/31/2020 15:51   DG Ankle 2 Views Left  Result Date: 01/31/2020 CLINICAL DATA:  Status post trauma. EXAM: LEFT ANKLE - 2 VIEW COMPARISON:  January 25, 2008 FINDINGS: The left ankle was imaged in a fiberglass cast with partially obscured osseous and soft tissue detail. An acute fracture deformity is seen extending through the left lateral malleolus. Approximately 1 shaft width lateral displacement of the distal fracture site is seen. A 1.0 cm cortical density of indeterminate age is seen adjacent to the dorsal aspect of the distal left talus. There is mild lateral dislocation of the left ankle. Moderate severity soft tissue swelling is seen, most prominent along the medial aspect of the left ankle. Mild to moderate severity anterior lateral soft tissue air is also seen. IMPRESSION: 1. Acute fracture of the left lateral malleolus. 2. Mild lateral dislocation of the left ankle. 3. Findings likely consistent with a small fracture of indeterminate age originating from the distal left talus. Electronically Signed   By: Virgina Norfolk M.D.   On: 01/31/2020 03:35   CT Head Wo Contrast  Result Date: 01/31/2020 CLINICAL DATA:  Poly trauma. EXAM: CT HEAD WITHOUT CONTRAST TECHNIQUE: Contiguous axial images were obtained from the base of the skull through the vertex without intravenous contrast. COMPARISON:  CT head without contrast 12/16/2011 FINDINGS: Brain: Mild diffuse white matter disease is present. No acute infarct, hemorrhage, or mass lesion is present. Basal ganglia are intact. No acute or focal cortical abnormalities are present. No significant extraaxial fluid collection is present. The ventricles are of normal size. The brainstem and cerebellum are within normal limits. Vascular:  Atherosclerotic calcifications are present within the cavernous internal carotid arteries bilaterally. No hyperdense vessel is present. Skull: No significant extracranial soft tissue injury is present. Calvarium is intact. No focal lytic or blastic lesions are present. Sinuses/Orbits: Chronic opacification of the left maxillary sinus noted. Scattered mucosal thickening is present throughout the anterior ethmoid air cells and inferior frontal sinuses bilaterally. Posterior ethmoid air cells and sphenoid sinuses are clear. The mastoid air cells are clear. The globes and orbits are within normal limits. IMPRESSION: 1. No acute intracranial abnormality or significant interval change. 2. Mild diffuse white matter disease likely reflects the sequela of chronic microvascular ischemia. 3. Chronic left maxillary sinus disease. Electronically Signed   By: San Morelle M.D.   On: 01/31/2020 03:58   CT ANGIO LOW EXTREM LEFT W &/OR WO CONTRAST  Addendum Date: 01/31/2020   ADDENDUM REPORT: 01/31/2020 04:54 ADDENDUM: Salient findings discussed by telephone with Dr. Delora Fuel on [id number] at 0445 hours. Electronically Signed   By: Genevie Ann M.D.   On: 01/31/2020 04:54   Result Date: 01/31/2020 CLINICAL DATA:  59 year old male status post fall at 0130 hours. On Eliquis. Compound fracture left ankle with visible bone. EXAM: CT ANGIOGRAPHY OF ILIOFEMORAL RUNOFF, CT ANGIOGRAPHY LOWER LEFT EXTREMITY, CT ANGIOGRAPHY LOWER RIGHT EXTREMITY TECHNIQUE: Multidetector CT imaging of the abdomen, pelvis and lower extremities was performed using the standard protocol during bolus administration of intravenous contrast. Multiplanar CT image reconstructions and MIPs were obtained to evaluate the vascular anatomy. CONTRAST:  175mL OMNIPAQUE IOHEXOL 350 MG/ML SOLN COMPARISON:  Left ankle radiographs 0319 hours. [facility] CT Abdomen and Pelvis 04/27/2019. FINDINGS: VASCULAR Aorta:  Patent distal aorta with atherosclerosis. IMA  origin is included and patent. Iliac arteries: Patent bilateral iliac arteries with calcified atherosclerosis. RIGHT Lower Extremity Patent right femoral arteries with mild calcified atherosclerosis. No hemodynamically significant stenosis. Normal right popliteal artery. Runoff: Three-vessel, with no atherosclerosis or stenosis evident. LEFT Lower Extremity Patent left femoral arteries with mild atherosclerosis, most apparent in the distal left SFA (calcified plaque series 5, image 169). No femoral artery stenosis identified. Patent left popliteal artery with mild soft plaque versus mixing artifact on series 5, image 219. No significant stenosis. Runoff: 3 vessel left lower extremity runoff with mild calcified plaque. No discrete anterior or posterior tibial artery injury is identified. Symmetric enhancement of the dorsalis pedis and distal posterior tibial arteries. However, possible small foci of contrast extravasation within hematoma located just posterior and superior to the medial malleolus on series 5, image 357. Additional punctate bone fragments in this region. Regional subcutaneous hematoma measures 3-4 cm. See left ankle fracture details below. Review of the MIP images confirms the above findings. NON-VASCULAR Hepatobiliary: Minimally included right lower liver tip, negative. Pancreas: Minimally included uncinate, negative. Spleen: Not included. Adrenals/Urinary Tract: Visible kidneys appears stable since March and negative. No hydroureter. Diminutive, unremarkable urinary bladder. Stomach/Bowel: Nondilated visible large and small bowel. Normal retrocecal appendix (series 5, image 22). Diverticulosis of the visible right colon. No free air, free fluid or mesenteric stranding is visible. Lymphatic: No lymphadenopathy in the lower abdomen or pelvis. Reproductive: Negative. Other: No pelvic free fluid. Musculoskeletal: Chronic lower lumbar decompression and fusion. No acute pelvic fracture identified. Both  femurs appear intact. Evidence of comminuted but nondisplaced fracture of the lower left patella (series 9, image 64 and series 5, image 229. Small superimposed left knee joint effusion with fairly simple fluid density (series 5, image 216). Distal femur, proximal left tibia and proximal left fibula appear intact. Contralateral right knee appears intact. Right tib fib, right ankle, and right foot appear intact. Oblique mildly comminuted fracture distal left fibula metadiaphysis. Mildly comminuted fracture of the distal left tibia posterior malleolus. Those fractures are minimally to mildly displaced. Additional avulsion at the tip of the medial malleolus (series 9, image 104). Acute versus chronic fracture fragments about the neck of the talus with gas in the talonavicular joint. Talar dome appears intact. Chronic appearing fracture deformity of the left navicular adjacent to the joint space gas. Left calcaneus appears intact. No definite acute fracture elsewhere in the left foot. Soft tissue hematoma and gas bilaterally about the left ankle. Soft tissue gas tracks cephalad in the left tib fib toward the tibial tuberosity. The most confluent hematoma is posterior and overlying the medial malleolus. IMPRESSION: VASCULAR 1. No discrete lower extremity arterial injury is identified, but small foci of contrast extravasation are suspected within hematoma located superior to the left medial malleolus (series 5, image 357). 2. Otherwise symmetric bilateral lower extremity three-vessel runoff with mild atherosclerosis and no significant stenosis. 3. Mild distal aorta and bilateral iliofemoral artery atherosclerosis without significant stenosis. NON-VASCULAR 1. Comminuted acute fractures of the distal left fibula, posterior malleolus. More age indeterminate fracture fragments at the medial malleolus, neck of the talus. Associated soft tissue hematoma, and subcutaneous gas tracking up the left leg. 2. Comminuted minimally  displaced fracture of the left patella. Small left knee joint effusion. 3. Stable, negative visible lower abdomen and pelvis. Electronically Signed: By: Genevie Ann M.D. On: 01/31/2020 04:44   CT ANGIO LOW EXTREM RIGHT W &/OR WO CONTRAST  Addendum Date: 01/31/2020  ADDENDUM REPORT: 01/31/2020 04:54 ADDENDUM: Salient findings discussed by telephone with Dr. Delora Fuel on [id number] at 0445 hours. Electronically Signed   By: Genevie Ann M.D.   On: 01/31/2020 04:54   Result Date: 01/31/2020 CLINICAL DATA:  59 year old male status post fall at 0130 hours. On Eliquis. Compound fracture left ankle with visible bone. EXAM: CT ANGIOGRAPHY OF ILIOFEMORAL RUNOFF, CT ANGIOGRAPHY LOWER LEFT EXTREMITY, CT ANGIOGRAPHY LOWER RIGHT EXTREMITY TECHNIQUE: Multidetector CT imaging of the abdomen, pelvis and lower extremities was performed using the standard protocol during bolus administration of intravenous contrast. Multiplanar CT image reconstructions and MIPs were obtained to evaluate the vascular anatomy. CONTRAST:  167mL OMNIPAQUE IOHEXOL 350 MG/ML SOLN COMPARISON:  Left ankle radiographs 0319 hours. [facility] CT Abdomen and Pelvis 04/27/2019. FINDINGS: VASCULAR Aorta: Patent distal aorta with atherosclerosis. IMA origin is included and patent. Iliac arteries: Patent bilateral iliac arteries with calcified atherosclerosis. RIGHT Lower Extremity Patent right femoral arteries with mild calcified atherosclerosis. No hemodynamically significant stenosis. Normal right popliteal artery. Runoff: Three-vessel, with no atherosclerosis or stenosis evident. LEFT Lower Extremity Patent left femoral arteries with mild atherosclerosis, most apparent in the distal left SFA (calcified plaque series 5, image 169). No femoral artery stenosis identified. Patent left popliteal artery with mild soft plaque versus mixing artifact on series 5, image 219. No significant stenosis. Runoff: 3 vessel left lower extremity runoff with mild calcified  plaque. No discrete anterior or posterior tibial artery injury is identified. Symmetric enhancement of the dorsalis pedis and distal posterior tibial arteries. However, possible small foci of contrast extravasation within hematoma located just posterior and superior to the medial malleolus on series 5, image 357. Additional punctate bone fragments in this region. Regional subcutaneous hematoma measures 3-4 cm. See left ankle fracture details below. Review of the MIP images confirms the above findings. NON-VASCULAR Hepatobiliary: Minimally included right lower liver tip, negative. Pancreas: Minimally included uncinate, negative. Spleen: Not included. Adrenals/Urinary Tract: Visible kidneys appears stable since March and negative. No hydroureter. Diminutive, unremarkable urinary bladder. Stomach/Bowel: Nondilated visible large and small bowel. Normal retrocecal appendix (series 5, image 22). Diverticulosis of the visible right colon. No free air, free fluid or mesenteric stranding is visible. Lymphatic: No lymphadenopathy in the lower abdomen or pelvis. Reproductive: Negative. Other: No pelvic free fluid. Musculoskeletal: Chronic lower lumbar decompression and fusion. No acute pelvic fracture identified. Both femurs appear intact. Evidence of comminuted but nondisplaced fracture of the lower left patella (series 9, image 64 and series 5, image 229. Small superimposed left knee joint effusion with fairly simple fluid density (series 5, image 216). Distal femur, proximal left tibia and proximal left fibula appear intact. Contralateral right knee appears intact. Right tib fib, right ankle, and right foot appear intact. Oblique mildly comminuted fracture distal left fibula metadiaphysis. Mildly comminuted fracture of the distal left tibia posterior malleolus. Those fractures are minimally to mildly displaced. Additional avulsion at the tip of the medial malleolus (series 9, image 104). Acute versus chronic fracture  fragments about the neck of the talus with gas in the talonavicular joint. Talar dome appears intact. Chronic appearing fracture deformity of the left navicular adjacent to the joint space gas. Left calcaneus appears intact. No definite acute fracture elsewhere in the left foot. Soft tissue hematoma and gas bilaterally about the left ankle. Soft tissue gas tracks cephalad in the left tib fib toward the tibial tuberosity. The most confluent hematoma is posterior and overlying the medial malleolus. IMPRESSION: VASCULAR 1. No discrete lower extremity arterial injury  is identified, but small foci of contrast extravasation are suspected within hematoma located superior to the left medial malleolus (series 5, image 357). 2. Otherwise symmetric bilateral lower extremity three-vessel runoff with mild atherosclerosis and no significant stenosis. 3. Mild distal aorta and bilateral iliofemoral artery atherosclerosis without significant stenosis. NON-VASCULAR 1. Comminuted acute fractures of the distal left fibula, posterior malleolus. More age indeterminate fracture fragments at the medial malleolus, neck of the talus. Associated soft tissue hematoma, and subcutaneous gas tracking up the left leg. 2. Comminuted minimally displaced fracture of the left patella. Small left knee joint effusion. 3. Stable, negative visible lower abdomen and pelvis. Electronically Signed: By: Genevie Ann M.D. On: 01/31/2020 04:44   DG Chest Port 1 View  Result Date: 01/31/2020 CLINICAL DATA:  Preoperative respiratory exam EXAM: PORTABLE CHEST 1 VIEW COMPARISON:  CT 02/14/2017, radiograph 03/25/2016 FINDINGS: There are some chronically coarsened interstitial and bronchitic features though increased hazy interstitial opacities are present towards the lung bases with slight pulmonary vascular congestion. While these may be partially attributable to body habitus some early interstitial edema or atelectasis could present similarly in the absence of  infectious symptoms. The cardiomediastinal contours are fairly prominent though may be accentuated by low volumes and the portable technique. No pneumothorax. No effusion. Telemetry leads overlie the chest. No acute osseous or soft tissue abnormality. IMPRESSION: Hazy interstitial opacities towards the lung bases with slight pulmonary vascular congestion. While these may be partially attributable to body habitus could reflect some mild interstitial edema or atelectasis in the absence of infectious symptoms. Electronically Signed   By: Lovena Le M.D.   On: 01/31/2020 03:51   DG Ankle Left Port  Result Date: 02/01/2020 CLINICAL DATA:  ORIF left ankle. EXAM: PORTABLE LEFT ANKLE - 2 VIEW COMPARISON:  Prior studies of 01/31/2020. FINDINGS: External cast noted. Prior relocation and ORIF of the left ankle. Hardware intact. Anatomic alignment again noted. IMPRESSION: Prior relocation and ORIF of the left ankle. Hardware intact. Anatomic alignment again noted. Electronically Signed   By: Marcello Moores  Register   On: 02/01/2020 05:53   DG C-Arm 1-60 Min  Result Date: 01/31/2020 CLINICAL DATA:  Left ankle ORIF. EXAM: LEFT ANKLE - 2 VIEW; DG C-ARM 1-60 MIN COMPARISON:  01/31/2020. FINDINGS: Fluoro time: 1 minutes and 19 seconds. Ten C-arm fluoroscopic images were obtained intraoperatively and submitted for post operative interpretation. These images demonstrate ORIF of a lateral malleolar fracture with plate and screws. Please see the performing provider's procedural report for further detail. IMPRESSION: Intraoperative fluoroscopic imaging, as described above. Electronically Signed   By: Margaretha Sheffield MD   On: 01/31/2020 15:51   Scheduled Meds: . acetaminophen  500 mg Oral Q6H  . apixaban  5 mg Oral BID  . dicyclomine  20 mg Oral BID  . docusate sodium  100 mg Oral BID  . DULoxetine  60 mg Oral Daily  . gabapentin  600 mg Oral TID  . metFORMIN  500 mg Oral Q breakfast  . metoprolol tartrate  25 mg Oral QHS   . metoprolol tartrate  50 mg Oral Daily  . pantoprazole  40 mg Oral Daily  . simvastatin  20 mg Oral QHS  . tamsulosin  0.4 mg Oral QHS  . traMADol  50 mg Oral Q6H  . vitamin B-12  2,500 mcg Oral Daily   Continuous Infusions: .  ceFAZolin (ANCEF) IV 2 g (02/01/20 0207)  . methocarbamol (ROBAXIN) IV      LOS: 1 day   Georgina Quint  Lise Auer, DO [facility] PAGER is on AMION  If 7PM-7AM, please contact night-coverage [email or web address]

## 2020-02-01 NOTE — Op Note (Signed)
NAME: David Fitzgerald, David Fitzgerald MEDICAL RECORD [id number] ACCOUNT [id number] DATE OF BIRTH:Jun 07, 1960 FACILITY: [facility] LOCATION: [facility] PHYSICIAN:Kalandra Masters H. Alpheus Stiff, MD  OPERATIVE REPORT  DATE OF PROCEDURE:  01/31/2020  PREOPERATIVE DIAGNOSIS:  Grade II open left trimalleolar fracture dislocation.  POSTOPERATIVE DIAGNOSES: 1.  Grade II open left trimalleolar fracture dislocation. 2.  Ruptured left ankle syndesmosis.  PROCEDURES: 1.  Open reduction internal fixation of left ankle trimalleolar fracture without fixation of the posterior lip. 2.  Open treatment of left ankle dislocation with K-wire fixation of the tibiotalar joint. 3.  Open reduction internal fixation of left ankle syndesmosis. 4.  Debridement of open fracture dislocation including skin, subcutaneous tissue, fascia and bone. 5.  Stress fluoroscopy, left ankle.  SURGEON:  Altamese Thornton, MD  ASSISTANT:  None.  ANESTHESIA:  General.  COMPLICATIONS:  None.  ESTIMATED BLOOD LOSS:  100 mL.  SPECIMENS:  None.  URINARY OUTPUT:  I and O catheter placed at the end of the case.  Please refer to the anesthetic record as the quantity was not complete at the time of this dictation.  TOURNIQUET:  None.  DISPOSITION:  To PACU.  CONDITION:  Stable.  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  The patient is on chronic anticoagulation as well.  He fell yesterday sustaining an audible crack to his ankle, which he thought may have been the floor.  He continued ambulating and then heard a subsequent  crack similar in nature, but more significant with acute onset loss of ability to ambulate, deformity of the ankle, bleeding and protruding bone from the medial side.  The patient underwent  an attempted closed reduction primarily to extricate the skin  where it is being pinched between the inside of the medial malleolus and the foot distally, but the ankle remained widely displaced with lateral dislocation of the tibiotalar joint.  I discussed with the patient the risks and benefits of surgical  treatment including the elevated risk of deep infection that could lead to amputation in this scenario.  He stated there were leaves and other contaminants on the open wound.  We also discussed nonunion, arthritis, DVT, PE and multiple others including  heart attack, stroke and anesthetic complications.  He acknowledged these risks and provided consent to proceed.  BRIEF SUMMARY OF PROCEDURE:  The patient was given clindamycin preoperatively.  We also challenged him with cefazolin intraoperatively with a small test dose, which he tolerated well and we were able to continue with a 2 gram dose and demonstrate his  tolerance to cephalosporins.  A series of chlorhexidine washes were performed of the entire left lower extremity including the wound and skin.  This was supplemented with peroxide away from the wound itself to facilitate removal of the dried blood.   Then, a Betadine scrub and paint was performed.  A timeout was held.  The traumatic incision was extended on the anterior side distally and on the posterior side proximally.  We were able to retract the soft tissues.  I did visualize the saphenous vein,  which was intact.  The patient's entire medial retinaculum was avulsed from its distal insertion and reflected proximally.  It was contaminated.  There was also contaminated periosteum of the bone.  Consequently, I excised this portion of the bone in  addition to small areas of the skin, preserving as much as possible because of the tenuous closure on the medial side.  Also, subcutaneous tissue and of course the deep fascia and ligamentous sling that comprised the retinaculum, the posterior tibial   tendon itself was intact as was the nerve and vessel.  I then delivered the bone end through the traumatic wound which again had been extended.  This enabled me to use chlorhexidine wash.  I visualized the posterior malleolus fracture which was less than  20% of the joint.  I did scrub the bone here and used a curette as well to get that back to healthy, noncontaminated surface.  6000 mL of saline were used, again supplemented twice with chlorhexidine soap to remove any foreign debris.  Following this  surgical excision with the scalpel, I then brought the foot into an adducted position and performed a closure of the wound using 2-0 PDS in just a few areas and then 2-0 nylon simple sutures as well as a retention far-near-near-far suture over the 8 cm  wound.   My scrub tech helped to hold the foot in an adducted position.  It should be noted that prior to closure, fresh drapes and attire were applied to the field.  The attentions were then turned to the lateral malleolus here.  A 14 cm incision was  made over the lateral side.  Dissection was carried carefully down where the fracture hematoma was removed and passed off.  The periosteum was left intact as much as possible to the fibula, teasing it back in the areas of fracture site itself.  I was  able to obtain an interdigitation of the primary fracture components and compressed them with a lobster claw clamp and this was then followed by placement of a single anterior to posterior lag screw in an oblique direction.  After this, I then applied  the Paragon fibula plate, obtaining 3 standard cortices of purchase proximally and then multiple standard and locked distally.  Because I had visually inspected the posterior malleolus fracture and saw that it was not sufficient in and of itself to  warrant fixation for the articular surface and because of the risk of placing a screw that was contiguous with this open traumatic wound, I did not place an anteromedial  to posterolateral screw.  I did perform an external rotation stress view under  fluoroscopy and was able to see that the syndesmosis did widen and there was an increase in the medial clear space consistent with disruption.  Consequently, separate fixation was indicated.  I placed 2 screws.  Although my desire was to place these with  15 degrees of anteversion because of the location of the fibular plate, I was forced to place them essentially transversely.  The reduction, however, was outstanding and this was held provisionally and gently compressed into place with a large sharp  tenaculum placed in the head of the screw laterally and on the medial cortex anteromedially.  Following this, I then turned my attention to the tibiotalar joint dislocation.  Because of the extensive damage to the anterior and posterior capsule as well  as the complete avulsion of the medial constraints, I chose to secure the reduction using two 2.4 mm K-wires.  These were placed through the calcaneus and talus into the distal tibia with the ankle in neutral extension.  These were checked on orthogonal  views for position.  They were bent outside the skin and then sterile dressing using Mepitel  gauze and ABDs applied being sure to pad all potential areas of pressure copiously.  A posterior and stirrup splint was applied, then Ace wraps.  The patient was  awakened from anesthesia and taken to the PACU after insertion of I and O catheter.  PROGNOSIS:  The patient will be strictly nonweightbearing on the left lower extremity for the next 8 weeks.  We are hopeful that we can get full 6 weeks out of the tibiotalar K-wires, but these may need to be removed sooner than that depending on  migration of the pin sites.  He is at elevated risk for infection, which could again potentially lead to amputation.  Also at risk for delayed union and nonunion as well as breakage of his quadricortical syndesmotic screws.  He will be restarted on   Eliquis immediately.  HN/NUANCE  D:01/31/2020 T:02/01/2020 JOB:[id number]

## 2020-02-01 NOTE — TOC CAGE-AID Note (Signed)
Transition of Care [facility]) - CAGE-AID Screening   Patient Details  Name: David Fitzgerald MRN: [id number] Date of Birth: May 10, 1960  Clinical Narrative: Patient endorses alcohol use, approximately 1 beer a month. Denies need for resources at this time.    CAGE-AID Screening:   Have You Ever Felt You Ought to Cut Down on Your Drinking or Drug Use?: No Have People Annoyed You By Critizing Your Drinking Or Drug Use?: No Have You Felt Bad Or Guilty About Your Drinking Or Drug Use?: No Have You Ever Had a Drink or Used Drugs First Thing In The Morning to Steady Your Nerves or to Get Rid of a Hangover?: No CAGE-AID Score: 0

## 2020-02-01 NOTE — Progress Notes (Addendum)
   01/31/20 2014  Assess: MEWS Score  Temp 97.8 F (36.6 C)  BP (!) 159/117  Pulse Rate (!) 123  Resp 18  Level of Consciousness Alert  SpO2 94 %  O2 Device Nasal Cannula  O2 Flow Rate (L/min) 2 L/min  Assess: MEWS Score  MEWS Temp 0  MEWS Systolic 0  MEWS Pulse 2  MEWS RR 0  MEWS LOC 0  MEWS Score 2  MEWS Score Color Yellow  Assess: if the MEWS score is Yellow or Red  Were vital signs taken at a resting state? Yes  Focused Assessment No change from prior assessment  Early Detection of Sepsis Score *See Row Information* Low  MEWS guidelines implemented *See Row Information* Yes  Treat  MEWS Interventions Escalated (See documentation below)  Pain Scale 0-10  Pain Score 0  Take Vital Signs  Increase Vital Sign Frequency  Yellow: Q 2hr X 2 then Q 4hr X 2, if remains yellow, continue Q 4hrs  Escalate  MEWS: Escalate Yellow: discuss with charge nurse/RN and consider discussing with provider and RRT  Notify: Charge Nurse/RN  Name of Charge Nurse/RN Notified Darl Pikes, RN  Date Charge Nurse/RN Notified 01/31/20  Time Charge Nurse/RN Notified 2023  Notify: Provider  Provider Name/Title Katherina Right  Date Provider Notified 01/31/20  Time Provider Notified 2028  Notification Type Page  Notification Reason Change in status  Response See new orders  Date of Provider Response 01/31/20  Time of Provider Response 2037  Document  Patient Outcome Not stable and remains on department  Progress note created (see row info) Yes   Patient denies chest pain or SOB. No reports of pain in foot

## 2020-02-02 DIAGNOSIS — I4821 Permanent atrial fibrillation: Secondary | ICD-10-CM | POA: Diagnosis not present

## 2020-02-02 DIAGNOSIS — S82892B Other fracture of left lower leg, initial encounter for open fracture type I or II: Secondary | ICD-10-CM | POA: Diagnosis not present

## 2020-02-02 DIAGNOSIS — E872 Acidosis: Secondary | ICD-10-CM

## 2020-02-02 LAB — CBC WITH DIFFERENTIAL/PLATELET
Abs Immature Granulocytes: 0.11 10*3/uL — ABNORMAL HIGH (ref 0.00–0.07)
Basophils Absolute: 0 10*3/uL (ref 0.0–0.1)
Basophils Relative: 0 %
Eosinophils Absolute: 0 10*3/uL (ref 0.0–0.5)
Eosinophils Relative: 0 %
HCT: 37.4 % — ABNORMAL LOW (ref 39.0–52.0)
Hemoglobin: 12.4 g/dL — ABNORMAL LOW (ref 13.0–17.0)
Immature Granulocytes: 1 %
Lymphocytes Relative: 12 %
Lymphs Abs: 1.7 10*3/uL (ref 0.7–4.0)
MCH: 31.2 pg (ref 26.0–34.0)
MCHC: 33.2 g/dL (ref 30.0–36.0)
MCV: 94.2 fL (ref 80.0–100.0)
Monocytes Absolute: 1.5 10*3/uL — ABNORMAL HIGH (ref 0.1–1.0)
Monocytes Relative: 11 %
Neutro Abs: 10.2 10*3/uL — ABNORMAL HIGH (ref 1.7–7.7)
Neutrophils Relative %: 76 %
Platelets: 252 10*3/uL (ref 150–400)
RBC: 3.97 MIL/uL — ABNORMAL LOW (ref 4.22–5.81)
RDW: 15.5 % (ref 11.5–15.5)
WBC: 13.5 10*3/uL — ABNORMAL HIGH (ref 4.0–10.5)
nRBC: 0 % (ref 0.0–0.2)

## 2020-02-02 LAB — COMPREHENSIVE METABOLIC PANEL
ALT: 16 U/L (ref 0–44)
AST: 18 U/L (ref 15–41)
Albumin: 3.3 g/dL — ABNORMAL LOW (ref 3.5–5.0)
Alkaline Phosphatase: 54 U/L (ref 38–126)
Anion gap: 10 (ref 5–15)
BUN: 15 mg/dL (ref 6–20)
CO2: 28 mmol/L (ref 22–32)
Calcium: 8.9 mg/dL (ref 8.9–10.3)
Chloride: 102 mmol/L (ref 98–111)
Creatinine, Ser: 1.02 mg/dL (ref 0.61–1.24)
GFR, Estimated: >60 mL/min (ref >60)
Glucose, Bld: 147 mg/dL — ABNORMAL HIGH (ref 70–99)
Potassium: 4.4 mmol/L (ref 3.5–5.1)
Sodium: 140 mmol/L (ref 135–145)
Total Bilirubin: 0.3 mg/dL (ref 0.3–1.2)
Total Protein: 7.2 g/dL (ref 6.5–8.1)

## 2020-02-02 LAB — PHOSPHORUS: Phosphorus: 3.4 mg/dL (ref 2.5–4.6)

## 2020-02-02 LAB — LACTIC ACID, PLASMA
Lactic Acid, Venous: 3.2 mmol/L (ref 0.5–1.9)
Lactic Acid, Venous: 3.3 mmol/L (ref 0.5–1.9)

## 2020-02-02 LAB — MAGNESIUM: Magnesium: 2.1 mg/dL (ref 1.7–2.4)

## 2020-02-02 MED ORDER — SODIUM CHLORIDE 0.9 % IV BOLUS
1000.0000 mL | Freq: Once | INTRAVENOUS | Status: AC
  Administered 2020-02-02: 1000 mL via INTRAVENOUS

## 2020-02-02 MED ORDER — SODIUM CHLORIDE 0.9 % IV BOLUS
1000.0000 mL | Freq: Once | INTRAVENOUS | Status: AC
  Administered 2020-02-02: 11:00:00 1000 mL via INTRAVENOUS

## 2020-02-02 NOTE — Progress Notes (Signed)
PROGRESS NOTE    David Fitzgerald  [id number] DOB: 08/12/1960 DOA: 01/31/2020 PCP: Angelina Sheriff, MD   Brief Narrative:  HPI per Dr. Wynetta Fines on 01/31/20  HPI: David Fitzgerald is a 59 y.o. male with medical history significant of paroxysmal A. fib on Eliquis, IIDM, diabetic neuropathy, morbid obesity, BPH, venous insufficiency on as needed Lasix, presented with mechanical fall and left ankle fracture.  Patient woke up at night, and tripped and fell on the left leg and ankle, he said he was a little unsteady but denied any prodromes of lightheadedness chest pain or shortness of breath.  Sustained a open fracture of left ankle and came to ED. ED Course: X-ray showed acute left malleolus fracture with mild dislocation of left ankle.  Patient shifted to the OR to have ORIF of left ankle.  In PACU, patient developed in and out uncontrolled A. fib, responded to IV Lopressor.  Blood pressure stable, elevated lactic acid 2.6, WBC WNL.  **Interim History  Patient's heart rate is improved and he is status post ORIF for his left ankle.  PT OT evaluating and recommending SNF.  Patient continues to have a lactic acidosis so we will give him more fluids.  He will be given a 1 L bolus today and will continue IV fluids with normal saline at 75 MLS per hour.  Last lactic acid level 3.2 and slightly trending up but has been fluctuating in the 2-3 range.   Assessment & Plan:   Active Problems:   Open fracture dislocation of left ankle   Ankle fracture   Permanent atrial fibrillation (HCC)   Chronic A Fib now with RVR, improved -Patient appears to be hypovolemic, will give a short course 12 hours IV fluid; Now IVF resumed yesterday and will c/w NS at 75 mL/hr for now given his lactic acidosis -Rate control wise, responded to IV metoprolol in ED and PACU, discussed with surgeon and PACU nurse, patient appears to be stable to swallow and eat, will resume home metoprolol.  -PRN IV metoprolol 2.5 mg IV  every 6 as needed for heart rate greater than 110 -Continue with metoprolol tartrate 25 mg p.o. nightly -Surgeon agreed to restart Eliquis and now back on Apixaban 5 mg po BID -Make K>4, and check magnesium level  Open fracture of left ankle -Status post ORIF -Antibiotics and postop care as per surgery team -Continue with acetaminophen 325-650 mg p.o. every 6 as needed for mild pain, hydrocodone-acetaminophen 1-2 tabs p.o. every 4 as needed moderate pain, as well as hydromorphone IV 0.5-1 mg IV every 4 as needed for severe pain next-also continue with methocarbamol 500 g p.o./IV every 6 as needed for muscle spasms; patient also has oxycodone IR as well as tramadol and will need to consolidate his pain medications but will leave this to orthopedic surgery -Bowel regimen with bisacodyl 10 mg rectally, docusate 100 mg p.o. twice daily, and a Fleet enema -C/w Apixaban 5 mg po BID for VTE prophylaxis  -Orthopedic recommending nonweightbearing and following up with Dr. Marcelino Scot as an outpatient -Further care per Ortho and PT OT recommending skilled nursing facility once he is stable to be discharged  Lactic Acidosis -Patient's lactic acid level went from 2.6 and trended up to 3.9 is now trending down to 2.6 yesterday but then went back up to 2.8; today lactic acid level was 3.2 -Continue IV fluid hydration with normal saline at 75 MLS per hour we will hold his Metformin -He will be  given a 1 L normal saline bolus -Continue monitor and trend lactic  Leukocytosis -Likely in setting of his surgery and less likely infection given that he is afebrile and has no symptoms -WBC went from 9.2 -> 14.7 -> 13.5 -Continue to monitor for signs and symptoms of infection; currently no overt infection noted -Continue monitor and trend and repeat CMP in the a.m.  Renal Insufficiency rule out chronic kidney disease, improved -Patient's BUNs/creatinine went from 13/1.00 and is now 12/1.26 and is now trended back down  with IV fluid hydration is now 15/1.02 -IV fluid hydration as above and he will be given a 1 L bolus today and will continue with no signs any findings per hour -Avoid nephrotoxic medications, contrast dyes, hypotension and renally dose medications -Continue monitor renal function closely and repeat CMP in a.m.  OSA -CPAP HS  IIDM -Because he has a Lactic Acidosis will hold and stop Metformin for now -Blood sugars have been ranging from 147-222 on daily BMP/CMP -Check hemoglobin A1c in a.m. -If necessary will be placed on a moderate NovoLog sign scale insulin before meals and at bedtime  Hyperlipidemia -Continue simvastatin 20 g p.o. nightly  BPH -Continue Tamsulosin 0.4 mg p.o. nightly  GERD -Continue with Pantoprazole 40 mg p.o. daily  Diabetic Neuropathy  -Resume Gabapentin  Normocytic Anemia -Patient's hemoglobin/hematocrit went from 13.6/40.0 is now 12.4/37.4 and likely in the setting of a dilutional drop -Check anemia panel in the a.m.  -Continue to monitor for signs and symptoms of bleeding; currently no overt bleeding noted -Repeat CBC in a.m.  Obesity -Complicates overall prognosis and care -Estimated body mass index is 48.12 kg/m as calculated from the following:   Height as of this encounter: 6\' 2"  (1.88 m).   Weight as of this encounter: 170 kg. -Weight loss and Dietary Counseling given  DVT prophylaxis: Per Orthopedic Surgery  Code Status: FULL CODE  Family Communication: No family present at bedside  Disposition Plan: SNF when cleared by orthopedic surgery and bed is available; he still has a lactic acidosis so we will continue to monitor and trend and treat with IV fluids  Status is: Inpatient  Remains inpatient appropriate because:Unsafe d/c plan, IV treatments appropriate due to intensity of illness or inability to take PO and Inpatient level of care appropriate due to severity of illness   Dispo: The patient is from: Home               Anticipated d/c is to: SNF              Anticipated d/c date is: 2 days              Patient currently is not medically stable to d/c.  Consultants:   Orthopedic Surgery    Procedures:  PROCEDURES: 1.  Open reduction internal fixation of left ankle trimalleolar fracture without fixation of the posterior lip. 2.  Open treatment of left ankle dislocation with K-wire fixation of the tibiotalar joint. 3.  Open reduction internal fixation of left ankle syndesmosis. 4.  Debridement of open fracture dislocation including skin, subcutaneous tissue, fascia and bone. 5.  Stress fluoroscopy, left ankle.  Antimicrobials: Anti-infectives (From admission, onward)   Start     Dose/Rate Route Frequency Ordered Stop   02/01/20 0100  ceFAZolin (ANCEF) IVPB 2g/100 mL premix        2 g 200 mL/hr over 30 Minutes Intravenous Every 8 hours 02/01/20 0019 02/01/20 1733   01/31/20 1543  vancomycin (VANCOCIN) powder  Status:  Discontinued          As needed 01/31/20 1544 01/31/20 1638   01/31/20 0430  clindamycin (CLEOCIN) IVPB 900 mg  Status:  Discontinued        900 mg 100 mL/hr over 30 Minutes Intravenous Every 8 hours 01/31/20 0408 02/01/20 0017   01/31/20 0315  ceFAZolin (ANCEF) IVPB 2g/100 mL premix        2 g 200 mL/hr over 30 Minutes Intravenous  Once 01/31/20 0308 01/31/20 0547       Subjective: Seen and examined at bedside and he is sitting up at bedside watching television and in no acute distress.  States his pain was fairly well controlled.  States he slept fairly well last night as well.  No nausea or vomiting.  Understands that he needs to go to SNF and requesting that he gets to a SNF in [place] that is close to his house.  Objective: Vitals:   02/01/20 1328 02/01/20 2009 02/02/20 0449 02/02/20 0753  BP: 126/78 (!) 169/93 (!) 140/96 (!) 147/91  Pulse: 95 68 90 89  Resp: 18 18 17 18   Temp: 98.2 F (36.8 C) 98.1 F (36.7 C) 98.1 F (36.7 C) (!) 97.3 F (36.3 C)  TempSrc: Oral Oral  Oral Oral  SpO2: 99% 91% 93% 99%  Weight:      Height:        Intake/Output Summary (Last 24 hours) at 02/02/2020 1244 Last data filed at 02/02/2020 1000 Gross per 24 hour  Intake 1931.58 ml  Output 3550 ml  Net -1618.42 ml   Filed Weights   01/31/20 0255 01/31/20 0308  Weight: (!) 170 kg (!) 170 kg   Examination: Physical Exam:  Constitutional: WN/WD obese Caucasian male currently no acute distress appears calm and relatively comfortable  Eyes: Lids and conjunctivae normal, sclerae anicteric  ENMT: External Ears, Nose appear normal. Grossly normal hearing.  Neck: Appears normal, supple, no cervical masses, normal ROM, no appreciable thyromegaly; no JVD Respiratory: Diminished to auscultation bilaterally, no wheezing, rales, rhonchi or crackles. Normal respiratory effort and patient is not tachypenic. No accessory muscle use.  Unlabored breathing Cardiovascular: RRR, no murmurs / rubs / gallops. S1 and S2 auscultated.  Very minimal extremity edema Abdomen: Soft, non-tender, distended secondary body habitus. Bowel sounds positive.  GU: Deferred. Musculoskeletal: No clubbing / cyanosis of digits/nails.  Left ankle is wrapped in an Ace bandage.  Skin: No rashes, lesions, ulcers on limited skin evaluation. No induration; Warm and dry.  Neurologic: CN 2-12 grossly intact with no focal deficits. Romberg sign and cerebellar reflexes not assessed.  Psychiatric: Normal judgment and insight. Alert and oriented x 3. Normal mood and appropriate affect.   Data Reviewed: I have personally reviewed following labs and imaging studies  CBC: Recent Labs  Lab 01/31/20 0320 01/31/20 0329 02/01/20 0211 02/02/20 0233  WBC 9.2  --  14.7* 13.5*  NEUTROABS  --   --   --  10.2*  HGB 14.6 15.0 13.6 12.4*  HCT 43.6 44.0 40.0 37.4*  MCV 92.2  --  91.3 94.2  PLT 213  --  283 [id number]   Basic Metabolic Panel: Recent Labs  Lab 01/31/20 0320 01/31/20 0329 01/31/20 1912 02/01/20 0211 02/02/20 0233   NA 137 138  --  137 140  K 3.8 3.8  --  4.0 4.4  CL 100 99  --  100 102  CO2 26  --   --  25 28  GLUCOSE 165* 155*  --  222* 147*  BUN 11 13  --  12 15  CREATININE 1.12 1.00  --  1.26* 1.02  CALCIUM 8.9  --   --  8.9 8.9  MG  --   --  2.1  --  2.1  PHOS  --   --  2.9  --  3.4   GFR: Estimated Creatinine Clearance: 127.8 mL/min (by C-G formula based on SCr of 1.02 mg/dL). Liver Function Tests: Recent Labs  Lab 01/31/20 0320 02/02/20 0233  AST 29 18  ALT 24 16  ALKPHOS 66 54  BILITOT 0.7 0.3  PROT 7.2 7.2  ALBUMIN 3.4* 3.3*   No results for input(s): LIPASE, AMYLASE in the last 168 hours. No results for input(s): AMMONIA in the last 168 hours. Coagulation Profile: Recent Labs  Lab 01/31/20 0320  INR 1.1   Cardiac Enzymes: No results for input(s): CKTOTAL, CKMB, CKMBINDEX, TROPONINI in the last 168 hours. BNP (last 3 results) No results for input(s): PROBNP in the last 8760 hours. HbA1C: No results for input(s): HGBA1C in the last 72 hours. CBG: Recent Labs  Lab 01/31/20 1147 01/31/20 1643  GLUCAP 121* 173*   Lipid Profile: No results for input(s): CHOL, HDL, LDLCALC, TRIG, CHOLHDL, LDLDIRECT in the last 72 hours. Thyroid Function Tests: No results for input(s): TSH, T4TOTAL, FREET4, T3FREE, THYROIDAB in the last 72 hours. Anemia Panel: No results for input(s): VITAMINB12, FOLATE, FERRITIN, TIBC, IRON, RETICCTPCT in the last 72 hours. Sepsis Labs: Recent Labs  Lab 01/31/20 2156 02/01/20 0914 02/01/20 1250 02/02/20 0855  LATICACIDVEN 3.9* 2.6* 2.8* 3.2*    Recent Results (from the past 240 hour(s))  Resp Panel by RT-PCR (Flu A&B, Covid) Nasopharyngeal Swab     Status: None   Collection Time: 01/31/20  3:15 AM   Specimen: Nasopharyngeal Swab; Nasopharyngeal(NP) swabs in vial transport medium  Result Value Ref Range Status   SARS Coronavirus 2 by RT PCR NEGATIVE NEGATIVE Final    Comment: (NOTE) SARS-CoV-2 target nucleic acids are NOT DETECTED.  The  SARS-CoV-2 RNA is generally detectable in upper respiratory specimens during the acute phase of infection. The lowest concentration of SARS-CoV-2 viral copies this assay can detect is 138 copies/mL. A negative result does not preclude SARS-Cov-2 infection and should not be used as the sole basis for treatment or other patient management decisions. A negative result may occur with  improper specimen collection/handling, submission of specimen other than nasopharyngeal swab, presence of viral mutation(s) within the areas targeted by this assay, and inadequate number of viral copies(<138 copies/mL). A negative result must be combined with clinical observations, patient history, and epidemiological information. The expected result is Negative.  Fact Sheet for Patients:  BloggerCourse.com  Fact Sheet for Healthcare Providers:  SeriousBroker.it  This test is no t yet approved or cleared by the [place] FDA and  has been authorized for detection and/or diagnosis of SARS-CoV-2 by FDA under an Emergency Use Authorization (EUA). This EUA will remain  in effect (meaning this test can be used) for the duration of the COVID-19 declaration under Section 564(b)(1) of the Act, 21 U.S.C.section 360bbb-3(b)(1), unless the authorization is terminated  or revoked sooner.       Influenza A by PCR NEGATIVE NEGATIVE Final   Influenza B by PCR NEGATIVE NEGATIVE Final    Comment: (NOTE) The Xpert Xpress SARS-CoV-2/FLU/RSV plus assay is intended as an aid in the diagnosis of influenza from Nasopharyngeal swab specimens and should not be used as a sole basis for treatment.  Nasal washings and aspirates are unacceptable for Xpert Xpress SARS-CoV-2/FLU/RSV testing.  Fact Sheet for Patients: EntrepreneurPulse.com.au  Fact Sheet for Healthcare Providers: IncredibleEmployment.be  This test is not yet approved or  cleared by the [place] FDA and has been authorized for detection and/or diagnosis of SARS-CoV-2 by FDA under an Emergency Use Authorization (EUA). This EUA will remain in effect (meaning this test can be used) for the duration of the COVID-19 declaration under Section 564(b)(1) of the Act, 21 U.S.C. section 360bbb-3(b)(1), unless the authorization is terminated or revoked.  Performed at [facility], [place] [address], [place], [place] [address]   Surgical pcr screen     Status: None   Collection Time: 01/31/20 11:40 AM   Specimen: Nasal Mucosa; Nasal Swab  Result Value Ref Range Status   MRSA, PCR NEGATIVE NEGATIVE Final   Staphylococcus aureus NEGATIVE NEGATIVE Final    Comment: (NOTE) The Xpert SA Assay (FDA approved for NASAL specimens in patients 41 years of age and older), is one component of a comprehensive surveillance program. It is not intended to diagnose infection nor to guide or monitor treatment. Performed at [facility], [place] [address], [place], [place] [address]     RN Pressure Injury Documentation:     Estimated body mass index is 48.12 kg/m as calculated from the following:   Height as of this encounter: 6\' 2"  (1.88 m).   Weight as of this encounter: 170 kg.  Malnutrition Type:    Malnutrition Characteristics:   Nutrition Interventions:     Radiology Studies: DG Ankle 2 Views Left  Result Date: 01/31/2020 CLINICAL DATA:  Left ankle ORIF. EXAM: LEFT ANKLE - 2 VIEW; DG C-ARM 1-60 MIN COMPARISON:  01/31/2020. FINDINGS: Fluoro time: 1 minutes and 19 seconds. Ten C-arm fluoroscopic images were obtained intraoperatively and submitted for post operative interpretation. These images demonstrate ORIF of a lateral malleolar fracture with plate and screws. Please see the performing provider's procedural report for further detail. IMPRESSION: Intraoperative fluoroscopic imaging, as described above. Electronically Signed   By: Margaretha Sheffield MD    On: 01/31/2020 15:51   DG Ankle Left Port  Result Date: 02/01/2020 CLINICAL DATA:  ORIF left ankle. EXAM: PORTABLE LEFT ANKLE - 2 VIEW COMPARISON:  Prior studies of 01/31/2020. FINDINGS: External cast noted. Prior relocation and ORIF of the left ankle. Hardware intact. Anatomic alignment again noted. IMPRESSION: Prior relocation and ORIF of the left ankle. Hardware intact. Anatomic alignment again noted. Electronically Signed   By: Marcello Moores  Register   On: 02/01/2020 05:53   DG C-Arm 1-60 Min  Result Date: 01/31/2020 CLINICAL DATA:  Left ankle ORIF. EXAM: LEFT ANKLE - 2 VIEW; DG C-ARM 1-60 MIN COMPARISON:  01/31/2020. FINDINGS: Fluoro time: 1 minutes and 19 seconds. Ten C-arm fluoroscopic images were obtained intraoperatively and submitted for post operative interpretation. These images demonstrate ORIF of a lateral malleolar fracture with plate and screws. Please see the performing provider's procedural report for further detail. IMPRESSION: Intraoperative fluoroscopic imaging, as described above. Electronically Signed   By: Margaretha Sheffield MD   On: 01/31/2020 15:51   Scheduled Meds: . apixaban  5 mg Oral BID  . dicyclomine  20 mg Oral BID  . docusate sodium  100 mg Oral BID  . DULoxetine  60 mg Oral Daily  . gabapentin  600 mg Oral TID  . metoprolol tartrate  25 mg Oral QHS  . metoprolol tartrate  50 mg Oral Daily  . pantoprazole  40  mg Oral Daily  . simvastatin  20 mg Oral QHS  . tamsulosin  0.4 mg Oral QHS  . traMADol  50 mg Oral Q6H  . vitamin B-12  2,500 mcg Oral Daily   Continuous Infusions: . sodium chloride 75 mL/hr at 02/02/20 0345  . methocarbamol (ROBAXIN) IV      LOS: 2 days   Kerney Elbe, DO [facility] PAGER is on AMION  If 7PM-7AM, please contact night-coverage [email or web address]

## 2020-02-02 NOTE — Progress Notes (Signed)
Patient refused CPAP for the night  

## 2020-02-02 NOTE — TOC Initial Note (Signed)
Transition of Care [facility]) - Initial/Assessment Note    Patient Details  Name: David Fitzgerald MRN: [id number] Date of Birth: 1960-08-04  Transition of [facility]) CM/SW Contact:    Curlene Labrum, RN Phone Number: 02/02/2020, 3:57 PM  Clinical Narrative:                 Case management met with the patient at the bedside regarding transitions of care to a SNF.  The patient was given Medicare choice regarding SNF facility and he prefers admission to [facility] [place] or [facility].  The patient is fully vaccinated for COVID through [facility] and would like to have the booster here if he is able.  I will communicate this to Dr. Marcelino Scot and SNF work up was started.  Expected Discharge Plan: Skilled Nursing Facility Barriers to Discharge: Continued Medical Work up   Patient Goals and CMS Choice Patient states their goals for this hospitalization and ongoing recovery are:: Patient plans to discharge to SNF facility - prefers [facility] [place] or [facility] SNF. CMS Medicare.gov Compare Post Acute Care list provided to:: Patient Choice offered to / list presented to : Patient  Expected Discharge Plan and Services Expected Discharge Plan: [place]   Discharge Planning Services: CM Consult Post Acute Care Choice: [place] Living arrangements for the past 2 months: Single Family Home                                      Prior Living Arrangements/Services Living arrangements for the past 2 months: Single Family Home Lives with:: Self Patient language and need for interpreter reviewed:: Yes Do you feel safe going back to the place where you live?: Yes      Need for Family Participation in Patient Care: Yes (Comment) Care giver support system in place?: Yes (comment) Current home services: DME Criminal Activity/Legal Involvement Pertinent to Current Situation/Hospitalization: No - Comment as needed  Activities of Daily  Living Home Assistive Devices/Equipment: None ADL Screening (condition at time of admission) Patient's cognitive ability adequate to safely complete daily activities?: Yes Is the patient deaf or have difficulty hearing?: No Does the patient have difficulty seeing, even when wearing glasses/contacts?: No Does the patient have difficulty concentrating, remembering, or making decisions?: No Patient able to express need for assistance with ADLs?: Yes Does the patient have difficulty dressing or bathing?: No Independently performs ADLs?: Yes (appropriate for developmental age) Does the patient have difficulty walking or climbing stairs?: Yes Weakness of Legs: Left Weakness of Arms/Hands: None  Permission Sought/Granted Permission sought to share information with : Case Manager Permission granted to share information with : Yes, Verbal Permission Granted     Permission granted to share info w AGENCY: SNF facility - prefers [facility] [place] or [facility]  Permission granted to share info w Relationship: mother - Lannette Donath     Emotional Assessment Appearance:: Appears stated age Attitude/Demeanor/Rapport: Gracious Affect (typically observed): Accepting Orientation: : Oriented to Self,Oriented to Place,Oriented to  Time,Oriented to Situation Alcohol / Substance Use: Not Applicable Psych Involvement: No (comment)  Admission diagnosis:  Chronic anticoagulation [Z79.01] Open left ankle fracture [S82.892B] Closed nondisplaced fracture of left patella, unspecified fracture morphology, initial encounter [S82.002A] Open fracture dislocation of left ankle [S82.892B] Ankle fracture [S82.899A] Patient Active Problem List   Diagnosis Date Noted  . Open fracture dislocation of left ankle 01/31/2020  . Ankle  fracture 01/31/2020  . Permanent atrial fibrillation ([place])   . Hypertension   . Diabetes ([place])   . Bee sting allergy   . Hypotension 12/25/2017  . Chronic anticoagulation  03/07/2017  . Coronary artery calcification seen on CT scan 02/07/2017  . CAD in native artery 02/07/2017  . Persistent atrial fibrillation ([place]) 02/06/2017  . Anxiety 02/06/2017  . Arthritis 02/06/2017  . Pernicious anemia 02/06/2017  . Allergy to pollen 04/06/2015  . OSA treated with BiPAP 12/14/2014  . Obesity hypoventilation syndrome ([place]) 12/14/2014  . Morbid obesity with BMI of 40.0-44.9, adult ([place]) 12/14/2014  . Hypertensive heart disease 12/14/2014  . Hyperlipidemia, mixed 12/14/2014  . Type 2 diabetes mellitus with diabetic neuropathy ([place]) 12/14/2014  . Chronic venous insufficiency 12/14/2014  . Edema 12/14/2014  . Morbid obesity ([place]) 12/14/2014  . Bell's palsy 02/24/2013  . Polyneuropathy in diabetes ([place]) 02/24/2013  . Syndrome affecting cervical region 02/24/2013  . Trigeminal neuralgia 02/24/2013  . Lumbar pain with radiation down left leg 07/09/2012  . BMI 45.0-49.9, adult ([place]) 07/09/2012  . Pain syndrome, chronic 07/09/2012  . Morbid obesity with BMI of 45.0-49.9, adult ([place]) 07/09/2012   PCP:  Angelina Sheriff, MD Pharmacy:   [facility], [place] [address] Phone: [phone] Fax: [phone]  [facility] [place], [place] - [address] [place] [address] Phone: [phone] Fax: [phone]     Social Determinants of Health (SDOH) Interventions    Readmission Risk Interventions Readmission Risk Prevention Plan 02/02/2020  Post Dischage Appt Complete  Medication Screening Complete  Transportation Screening Complete  Some recent data might be hidden

## 2020-02-02 NOTE — [place] FL2 (Signed)
[place] MEDICAID FL2 LEVEL OF CARE SCREENING TOOL     IDENTIFICATION  Patient Name: David Fitzgerald Birthdate: March 17, 1960 Sex: male Admission Date (Current Location): 01/31/2020  [facility] and [place] Number:  Producer, television/film/video and Address:  [facility] [place], [address] [place], [place] [address]      Provider Number: [id number]  Attending Physician Name and Address:  Myrene Galas, MD  Relative Name and Phone Number:  Lewi Drost, mother - [phone]    Current Level of Care: Hospital Recommended Level of Care: Skilled Nursing Facility Prior Approval Number:  [id number]  Date Approved/Denied:   PASRR Number: [id number]  Discharge Plan: SNF    Current Diagnoses: Patient Active Problem List   Diagnosis Date Noted  . Open fracture dislocation of left ankle 01/31/2020  . Ankle fracture 01/31/2020  . Permanent atrial fibrillation (HCC)   . Hypertension   . Diabetes (HCC)   . Bee sting allergy   . Hypotension 12/25/2017  . Chronic anticoagulation 03/07/2017  . Coronary artery calcification seen on CT scan 02/07/2017  . CAD in native artery 02/07/2017  . Persistent atrial fibrillation (HCC) 02/06/2017  . Anxiety 02/06/2017  . Arthritis 02/06/2017  . Pernicious anemia 02/06/2017  . Allergy to pollen 04/06/2015  . OSA treated with BiPAP 12/14/2014  . Obesity hypoventilation syndrome (HCC) 12/14/2014  . Morbid obesity with BMI of 40.0-44.9, adult (HCC) 12/14/2014  . Hypertensive heart disease 12/14/2014  . Hyperlipidemia, mixed 12/14/2014  . Type 2 diabetes mellitus with diabetic neuropathy (HCC) 12/14/2014  . Chronic venous insufficiency 12/14/2014  . Edema 12/14/2014  . Morbid obesity (HCC) 12/14/2014  . Bell's palsy 02/24/2013  . 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 Syndrome affecting cervical region 02/24/2013  . Trigeminal neuralgia 02/24/2013  . Lumbar pain with radiation down left leg 07/09/2012  . BMI  45.0-49.9, adult (HCC) 07/09/2012  . Pain syndrome, chronic 07/09/2012  . Morbid obesity with BMI of 45.0-49.9, adult (HCC) 07/09/2012    Orientation RESPIRATION BLADDER Height & Weight     Self,Time,Situation,Place  Normal Continent Weight: (!) 170 kg Height:  6\' 2"  (188 cm)  BEHAVIORAL SYMPTOMS/MOOD NEUROLOGICAL BOWEL NUTRITION STATUS      Continent Diet (See discharge summary)  AMBULATORY STATUS COMMUNICATION OF NEEDS Skin   Limited Assist (NWB on Left leg only per orders - mobilize with RW or WC) Verbally Surgical wounds                       Personal Care Assistance Level of Assistance  Bathing,Feeding,Dressing Bathing Assistance: Limited assistance Feeding assistance: Independent Dressing Assistance: Limited assistance     Functional Limitations Info  Sight,Hearing,Speech Sight Info: Adequate Hearing Info: Adequate Speech Info: Adequate    SPECIAL CARE FACTORS FREQUENCY  PT (By licensed PT),OT (By licensed OT)     PT Frequency: 5 x per week OT Frequency: 5 x per week            Contractures Contractures Info: Not present    Additional Factors Info  Code Status,Allergies,Psychotropic,Insulin Sliding Scale (fully vaccinated for COVID with Moderna vaccine) Code Status Info: Full code Allergies Info: penicillin, aspirin, bee-stings Psychotropic Info: Cymbalta, Neurontin, Ambien Insulin Sliding Scale Info: See discharge summary       Current Medications (02/02/2020):  This is the current hospital active medication list Current Facility-Administered Medications  Medication Dose Route Frequency Provider Last Rate Last Admin  . 0.9 %  sodium chloride infusion   Intravenous  Continuous Raiford Noble [place], [place] 75 mL/hr at 02/02/20 1540 New Bag at 02/02/20 1540  . acetaminophen (TYLENOL) tablet 325-650 mg  325-650 mg Oral Q6H PRN Margy Clarks M, PA-C      . apixaban Arne [place]) tablet 5 mg  5 mg Oral BID Donnamae Jude, RPH   5 mg at 02/02/20 [id number]  . bisacodyl  (DULCOLAX) suppository 10 mg  10 mg Rectal Daily PRN Altamese [place]olin[place], MD      . dicyclomine (BENTYL) tablet 20 mg  20 mg Oral BID Wynetta Fines T, MD   20 mg at 02/02/20 0827  . diphenhydrAMINE (BENADRYL) 12.5 MG/5ML elixir 12.5-25 mg  12.5-25 mg Oral Q4H PRN Margy Clarks M, PA-C      . docusate sodium (COLACE) capsule 100 mg  100 mg Oral BID Altamese Bell, MD   100 mg at 02/02/20 0827  . DULoxetine (CYMBALTA) DR capsule 60 mg  60 mg Oral Daily Wynetta Fines T, MD   60 mg at 02/02/20 0827  . gabapentin (NEURONTIN) capsule 600 mg  600 mg Oral TID Wynetta Fines T, MD   600 mg at 02/02/20 1508  . HYDROcodone-acetaminophen (NORCO/VICODIN) 5-325 MG per tablet 1-2 tablet  1-2 tablet Oral Q4H PRN Altamese Bowman, MD      . HYDROmorphone (DILAUDID) injection 0.5-1 mg  0.5-1 mg Intravenous Q4H PRN Margy Clarks M, PA-C      . magnesium hydroxide (MILK OF MAGNESIA) suspension 30 mL  30 mL Oral Daily PRN Altamese Clifton, MD      . methocarbamol (ROBAXIN) tablet 500 mg  500 mg Oral Q6H PRN Margy Clarks M, PA-C       Or  . methocarbamol (ROBAXIN) 500 mg in dextrose 5 % 50 mL IVPB  500 mg Intravenous Q6H PRN Margy Clarks M, PA-C      . metoCLOPramide (REGLAN) tablet 5-10 mg  5-10 mg Oral Q8H PRN Margy Clarks M, PA-C       Or  . metoCLOPramide (REGLAN) injection 5-10 mg  5-10 mg Intravenous Q8H PRN Margy Clarks M, PA-C      . metoprolol tartrate (LOPRESSOR) injection 2.5 mg  2.5 mg Intravenous Q6H PRN Wynetta Fines T, MD   2.5 mg at 02/01/20 0045  . metoprolol tartrate (LOPRESSOR) tablet 25 mg  25 mg Oral QHS Donnamae Jude, RPH   25 mg at 02/01/20 2057  . metoprolol tartrate (LOPRESSOR) tablet 50 mg  50 mg Oral Daily Wynetta Fines T, MD   50 mg at 02/02/20 0827  . naproxen (NAPROSYN) tablet 250-500 mg  250-500 mg Oral Daily PRN Donnamae Jude, RPH      . ondansetron [facility]) tablet 4 mg  4 mg Oral Q6H PRN Altamese [place], MD       Or  . ondansetron [facility]) injection 4 mg  4 mg Intravenous Q6H PRN Altamese Laporte, MD      . oxyCODONE (Oxy IR/ROXICODONE) immediate release tablet 10-15 mg  10-15 mg Oral Q4H PRN Margy Clarks M, PA-C      . pantoprazole (PROTONIX) EC tablet 40 mg  40 mg Oral Daily Wynetta Fines T, MD   40 mg at 02/02/20 0827  . senna-docusate (Senokot-S) tablet 1 tablet  1 tablet Oral QHS PRN Margy Clarks M, PA-C      . simvastatin (ZOCOR) tablet 20 mg  20 mg Oral QHS Wynetta Fines T, MD   20 mg at 02/01/20 2056  . sodium phosphate (FLEET) 7-19 GM/118ML enema 1 enema  1 enema Rectal Once PRN Altamese [place], MD      . tamsulosin [facility]) capsule 0.4 mg  0.4 mg Oral QHS Wynetta Fines T, MD   0.4 mg at 02/01/20 2056  . traMADol (ULTRAM) tablet 50 mg  50 mg Oral Q6H Altamese [place], MD   50 mg at 02/02/20 1121  . vitamin B-12 (CYANOCOBALAMIN) tablet 2,500 mcg  2,500 mcg Oral Daily Donnamae Jude, RPH   2,500 mcg at 02/02/20 0827  . zolpidem (AMBIEN) tablet 5 mg  5 mg Oral QHS PRN,MR X 1 Altamese [place], MD         Discharge Medications: Please see discharge summary for a list of discharge medications.  Relevant Imaging Results:  Relevant Lab Results:   Additional Information SS# [id number]  Curlene Labrum, RN

## 2020-02-03 DIAGNOSIS — S82892B Other fracture of left lower leg, initial encounter for open fracture type I or II: Secondary | ICD-10-CM | POA: Diagnosis not present

## 2020-02-03 DIAGNOSIS — E872 Acidosis: Secondary | ICD-10-CM | POA: Diagnosis not present

## 2020-02-03 DIAGNOSIS — I4821 Permanent atrial fibrillation: Secondary | ICD-10-CM | POA: Diagnosis not present

## 2020-02-03 LAB — CBC WITH DIFFERENTIAL/PLATELET
Abs Immature Granulocytes: 0.11 10*3/uL — ABNORMAL HIGH (ref 0.00–0.07)
Basophils Absolute: 0.1 10*3/uL (ref 0.0–0.1)
Basophils Relative: 0 %
Eosinophils Absolute: 0.2 10*3/uL (ref 0.0–0.5)
Eosinophils Relative: 2 %
HCT: 41.7 % (ref 39.0–52.0)
Hemoglobin: 13.2 g/dL (ref 13.0–17.0)
Immature Granulocytes: 1 %
Lymphocytes Relative: 21 %
Lymphs Abs: 2.5 10*3/uL (ref 0.7–4.0)
MCH: 30.1 pg (ref 26.0–34.0)
MCHC: 31.7 g/dL (ref 30.0–36.0)
MCV: 95 fL (ref 80.0–100.0)
Monocytes Absolute: 1.2 10*3/uL — ABNORMAL HIGH (ref 0.1–1.0)
Monocytes Relative: 10 %
Neutro Abs: 7.9 10*3/uL — ABNORMAL HIGH (ref 1.7–7.7)
Neutrophils Relative %: 66 %
Platelets: 240 10*3/uL (ref 150–400)
RBC: 4.39 MIL/uL (ref 4.22–5.81)
RDW: 15.7 % — ABNORMAL HIGH (ref 11.5–15.5)
WBC: 12 10*3/uL — ABNORMAL HIGH (ref 4.0–10.5)
nRBC: 0 % (ref 0.0–0.2)

## 2020-02-03 LAB — COMPREHENSIVE METABOLIC PANEL
ALT: 17 U/L (ref 0–44)
AST: 23 U/L (ref 15–41)
Albumin: 3.3 g/dL — ABNORMAL LOW (ref 3.5–5.0)
Alkaline Phosphatase: 58 U/L (ref 38–126)
Anion gap: 11 (ref 5–15)
BUN: 15 mg/dL (ref 6–20)
CO2: 26 mmol/L (ref 22–32)
Calcium: 8.5 mg/dL — ABNORMAL LOW (ref 8.9–10.3)
Chloride: 101 mmol/L (ref 98–111)
Creatinine, Ser: 1.01 mg/dL (ref 0.61–1.24)
GFR, Estimated: >60 mL/min (ref >60)
Glucose, Bld: 127 mg/dL — ABNORMAL HIGH (ref 70–99)
Potassium: 3.7 mmol/L (ref 3.5–5.1)
Sodium: 138 mmol/L (ref 135–145)
Total Bilirubin: 0.5 mg/dL (ref 0.3–1.2)
Total Protein: 7 g/dL (ref 6.5–8.1)

## 2020-02-03 LAB — RESP PANEL BY RT-PCR (FLU A&B, COVID) ARPGX2
Influenza A by PCR: NEGATIVE
Influenza B by PCR: NEGATIVE
SARS Coronavirus 2 by RT PCR: NEGATIVE

## 2020-02-03 LAB — MAGNESIUM: Magnesium: 1.9 mg/dL (ref 1.7–2.4)

## 2020-02-03 LAB — PHOSPHORUS: Phosphorus: 3.1 mg/dL (ref 2.5–4.6)

## 2020-02-03 LAB — LACTIC ACID, PLASMA: Lactic Acid, Venous: 1.5 mmol/L (ref 0.5–1.9)

## 2020-02-03 MED ORDER — SODIUM CHLORIDE 0.9 % IV SOLN: INTRAVENOUS | Status: AC

## 2020-02-03 NOTE — Progress Notes (Signed)
PROGRESS NOTE    David Fitzgerald  [id number] DOB: 07-Jul-1960 DOA: 01/31/2020 PCP: Angelina Sheriff, MD   Brief Narrative:  HPI per Dr. Wynetta Fines on 01/31/20  HPI: David Fitzgerald is a 59 y.o. male with medical history significant of paroxysmal A. fib on Eliquis, IIDM, diabetic neuropathy, morbid obesity, BPH, venous insufficiency on as needed Lasix, presented with mechanical fall and left ankle fracture.  Patient woke up at night, and tripped and fell on the left leg and ankle, he said he was a little unsteady but denied any prodromes of lightheadedness chest pain or shortness of breath.  Sustained a open fracture of left ankle and came to ED. ED Course: X-ray showed acute left malleolus fracture with mild dislocation of left ankle.  Patient shifted to the OR to have ORIF of left ankle.  In PACU, patient developed in and out uncontrolled A. fib, responded to IV Lopressor.  Blood pressure stable, elevated lactic acid 2.6, WBC WNL.  **Interim History  Patient's heart rate is improved and he is status post ORIF for his left ankle.  PT OT evaluating and recommending SNF.  Patient continues to have a lactic acidosis so we will give him more fluids. Last lactic acid level 3.2 and slightly trending up but has been fluctuating in the 2-3 range but now is finally resolved. He was given 2 L of boluses yesterday and was continued on IV fluid hydration but will now reduce the rate to 50 MLS per hour and stop after 10 more hours  Assessment & Plan:   Active Problems:   Open fracture dislocation of left ankle   Ankle fracture   Permanent atrial fibrillation (HCC)   Chronic A Fib now with RVR, improved -Patient appears to be hypovolemic, will give a short course 12 hours IV fluid; Now IVF resumed yesterday and will c/w NS at 75 mL/hr for now given his lactic acidosis -Rate control wise, responded to IV metoprolol in ED and PACU, discussed with surgeon and PACU nurse, patient appears to be stable to  swallow and eat, will resume home metoprolol.  -PRN IV metoprolol 2.5 mg IV every 6 as needed for heart rate greater than 110 -Continue with metoprolol tartrate 25 mg p.o. nightly -Surgeon agreed to restart Eliquis and now back on Apixaban 5 mg po BID -Make K>4, and check magnesium level -Continue to monitor on telemetry  Open fracture of left ankle -Status post ORIF -Antibiotics and postop care as per surgery team -Continue with acetaminophen 325-650 mg p.o. every 6 as needed for mild pain, hydrocodone-acetaminophen 1-2 tabs p.o. every 4 as needed moderate pain, as well as hydromorphone IV 0.5-1 mg IV every 4 as needed for severe pain next-also continue with methocarbamol 500 g p.o./IV every 6 as needed for muscle spasms; patient also has oxycodone IR as well as tramadol and will need to consolidate his pain medications but will leave this to orthopedic surgery -Bowel regimen with bisacodyl 10 mg rectally, docusate 100 mg p.o. twice daily, and a Fleet enema -C/w Apixaban 5 mg po BID for VTE prophylaxis  -Orthopedic recommending nonweightbearing and following up with Dr. Marcelino Scot as an outpatient -Further care per Ortho and PT OT recommending skilled nursing facility; from a medical standpoint he is stable to be discharged however the facilities that he requested do not have bed availability so currently bed availability is still pending  Lactic Acidosis, improved -Patient's lactic acid level went from 2.6 and trended up to 3.9 is  now trending down to 2.6 yesterday but then went back up to 2.8; today lactic acid level was 3.2 -Continue IV fluid hydration with normal saline at 75 MLS per hour we will hold his Metformin -He will be given two 1 L normal saline boluses yesterday  -Continue monitor and will not trend lactic acid level anymore given that his lactic acid level today was 1.5  Leukocytosis -Likely in setting of his surgery and less likely infection given that he is afebrile and has no  symptoms -WBC went from 9.2 -> 14.7 -> 13.5 and today it is 12.0 -Continue to monitor for signs and symptoms of infection; currently no overt infection noted -Continue monitor and trend and repeat CMP in the a.m.  Renal Insufficiency rule out chronic kidney disease, improved -Patient's BUNs/creatinine went from 13/1.00 and is now 12/1.26 and is now trended back down with IV fluid hydration is now 15/1.01 -IV fluid hydration as above and he will be given a 1 L bolus x2 yesterday and will continue with maintenance IV fluid at 75 MLS per hour and then reduce it to 50 MLS per hour and then will stop -Avoid nephrotoxic medications, contrast dyes, hypotension and renally dose medications -Continue monitor renal function closely and repeat CMP in a.m.  OSA -CPAP HS  IIDM -Because he has a Lactic Acidosis will hold and stop Metformin for now -Blood sugars have been ranging from 127-222 on daily BMP/CMP -Check hemoglobin A1c in a.m. -If necessary will be placed on a moderate NovoLog sign scale insulin before meals and at bedtime  Hyperlipidemia -Continue Simvastatin 20 g p.o. nightly  BPH -Continue Tamsulosin 0.4 mg p.o. nightly  GERD -Continue with Pantoprazole 40 mg p.o. daily  Diabetic Neuropathy  -Resume Gabapentin 600 mg po TID  Normocytic Anemia -Patient's hemoglobin/hematocrit went from 13.6/40.0 is now 12.4/37.4 and likely in the setting of a dilutional drop and is now 13.2/41.7 -Check anemia panel in the a.m.  -Continue to monitor for signs and symptoms of bleeding; currently no overt bleeding noted -Repeat CBC in a.m.  Obesity -Complicates overall prognosis and care -Estimated body mass index is 48.12 kg/m as calculated from the following:   Height as of this encounter: 6\' 2"  (1.88 m).   Weight as of this encounter: 170 kg. -Weight loss and Dietary Counseling given  DVT prophylaxis: Per Orthopedic Surgery  Code Status: FULL CODE  Family Communication: No family  present at bedside  Disposition Plan: SNF when cleared by orthopedic surgery and bed is available; Lactic Acidosis has resolved  Status is: Inpatient  Remains inpatient appropriate because:Unsafe d/c plan, IV treatments appropriate due to intensity of illness or inability to take PO and Inpatient level of care appropriate due to severity of illness   Dispo: The patient is from: Home              Anticipated d/c is to: SNF              Anticipated d/c date is: 2 days              Patient currently is not medically stable to d/c.  Consultants:   Orthopedic Surgery    Procedures:  PROCEDURES: 1.  Open reduction internal fixation of left ankle trimalleolar fracture without fixation of the posterior lip. 2.  Open treatment of left ankle dislocation with K-wire fixation of the tibiotalar joint. 3.  Open reduction internal fixation of left ankle syndesmosis. 4.  Debridement of open fracture dislocation including skin, subcutaneous  tissue, fascia and bone. 5.  Stress fluoroscopy, left ankle.  Antimicrobials: Anti-infectives (From admission, onward)   Start     Dose/Rate Route Frequency Ordered Stop   02/01/20 0100  ceFAZolin (ANCEF) IVPB 2g/100 mL premix        2 g 200 mL/hr over 30 Minutes Intravenous Every 8 hours 02/01/20 0019 02/01/20 1733   01/31/20 1543  vancomycin (VANCOCIN) powder  Status:  Discontinued          As needed 01/31/20 1544 01/31/20 1638   01/31/20 0430  clindamycin (CLEOCIN) IVPB 900 mg  Status:  Discontinued        900 mg 100 mL/hr over 30 Minutes Intravenous Every 8 hours 01/31/20 0408 02/01/20 0017   01/31/20 0315  ceFAZolin (ANCEF) IVPB 2g/100 mL premix        2 g 200 mL/hr over 30 Minutes Intravenous  Once 01/31/20 0308 01/31/20 0547       Subjective: Seen and examined at bedside and he is sitting in the left at the bedside and complaining of some hip soreness.  No nausea or vomiting.  Denies any lightheadedness or dizziness.  Chest pain or shortness of  breath.  Feels better today than he did yesterday.  No other concerns or complaints at this time.  Objective: Vitals:   02/02/20 1357 02/02/20 2246 02/03/20 0340 02/03/20 0735  BP: (!) 160/89 (!) 141/80 (!) 156/99 (!) 178/92  Pulse: 89 85 83 73  Resp: 18 16 15 17   Temp: 97.9 F (36.6 C) 98 F (36.7 C) 97.8 F (36.6 C) 97.8 F (36.6 C)  TempSrc: Oral Oral Oral Oral  SpO2: 97% 94% 96% 93%  Weight:      Height:        Intake/Output Summary (Last 24 hours) at 02/03/2020 1219 Last data filed at 02/03/2020 02/05/2020 Gross per 24 hour  Intake 3083.17 ml  Output --  Net 3083.17 ml   Filed Weights   01/31/20 0255 01/31/20 0308  Weight: (!) 170 kg (!) 170 kg   Examination: Physical Exam:  Constitutional: WN/WD obese Caucasian male currently no acute distress sitting up in the [place] lift at bedside appears calm and relatively comfortable but does complain about some hip soreness in the left Eyes: Lids and conjunctivae normal, sclerae anicteric  ENMT: External Ears, Nose appear normal. Grossly normal hearing. Neck: Appears normal, supple, no cervical masses, normal ROM, no appreciable thyromegaly; no JVD Respiratory: Diminished to auscultation bilaterally, no wheezing, rales, rhonchi or crackles. Normal respiratory effort and patient is not tachypenic. No accessory muscle use.  Unlabored breathing Cardiovascular: RRR, no murmurs / rubs / gallops. S1 and S2 auscultated.  1+ extremity Abdomen: Soft, non-tender, distended secondary habitus. Bowel sounds positive.  GU: Deferred. Musculoskeletal: No clubbing / cyanosis of digits/nails.  Left ankle is wrapped in Ace bandage Skin: No rashes, lesions, ulcers on limited skin evaluation. No induration; Warm and dry.  Neurologic: CN 2-12 grossly intact with no focal deficits. Romberg sign and cerebellar reflexes not assessed.  Psychiatric: Normal judgment and insight. Alert and oriented x 3. Normal mood and appropriate affect.   Data Reviewed: I  have personally reviewed following labs and imaging studies  CBC: Recent Labs  Lab 01/31/20 0320 01/31/20 0329 02/01/20 0211 02/02/20 0233 02/03/20 0147  WBC 9.2  --  14.7* 13.5* 12.0*  NEUTROABS  --   --   --  10.2* 7.9*  HGB 14.6 15.0 13.6 12.4* 13.2  HCT 43.6 44.0 40.0 37.4* 41.7  MCV 92.2  --  91.3 94.2 95.0  PLT 213  --  283 252 [id number]   Basic Metabolic Panel: Recent Labs  Lab 01/31/20 0320 01/31/20 0329 01/31/20 1912 02/01/20 0211 02/02/20 0233 02/03/20 0147  NA 137 138  --  137 140 138  K 3.8 3.8  --  4.0 4.4 3.7  CL 100 99  --  100 102 101  CO2 26  --   --  25 28 26   GLUCOSE 165* 155*  --  222* 147* 127*  BUN 11 13  --  12 15 15   CREATININE 1.12 1.00  --  1.26* 1.02 1.01  CALCIUM 8.9  --   --  8.9 8.9 8.5*  MG  --   --  2.1  --  2.1 1.9  PHOS  --   --  2.9  --  3.4 3.1   GFR: Estimated Creatinine Clearance: 129 mL/min (by C-G formula based on SCr of 1.01 mg/dL). Liver Function Tests: Recent Labs  Lab 01/31/20 0320 02/02/20 0233 02/03/20 0147  AST 29 18 23   ALT 24 16 17   ALKPHOS 66 54 58  BILITOT 0.7 0.3 0.5  PROT 7.2 7.2 7.0  ALBUMIN 3.4* 3.3* 3.3*   No results for input(s): LIPASE, AMYLASE in the last 168 hours. No results for input(s): AMMONIA in the last 168 hours. Coagulation Profile: Recent Labs  Lab 01/31/20 0320  INR 1.1   Cardiac Enzymes: No results for input(s): CKTOTAL, CKMB, CKMBINDEX, TROPONINI in the last 168 hours. BNP (last 3 results) No results for input(s): PROBNP in the last 8760 hours. HbA1C: No results for input(s): HGBA1C in the last 72 hours. CBG: Recent Labs  Lab 01/31/20 1147 01/31/20 1643  GLUCAP 121* 173*   Lipid Profile: No results for input(s): CHOL, HDL, LDLCALC, TRIG, CHOLHDL, LDLDIRECT in the last 72 hours. Thyroid Function Tests: No results for input(s): TSH, T4TOTAL, FREET4, T3FREE, THYROIDAB in the last 72 hours. Anemia Panel: No results for input(s): VITAMINB12, FOLATE, FERRITIN, TIBC, IRON,  RETICCTPCT in the last 72 hours. Sepsis Labs: Recent Labs  Lab 02/01/20 1250 02/02/20 0855 02/02/20 1344 02/03/20 0147  LATICACIDVEN 2.8* 3.2* 3.3* 1.5    Recent Results (from the past 240 hour(s))  Resp Panel by RT-PCR (Flu A&B, Covid) Nasopharyngeal Swab     Status: None   Collection Time: 01/31/20  3:15 AM   Specimen: Nasopharyngeal Swab; Nasopharyngeal(NP) swabs in vial transport medium  Result Value Ref Range Status   SARS Coronavirus 2 by RT PCR NEGATIVE NEGATIVE Final    Comment: (NOTE) SARS-CoV-2 target nucleic acids are NOT DETECTED.  The SARS-CoV-2 RNA is generally detectable in upper respiratory specimens during the acute phase of infection. The lowest concentration of SARS-CoV-2 viral copies this assay can detect is 138 copies/mL. A negative result does not preclude SARS-Cov-2 infection and should not be used as the sole basis for treatment or other patient management decisions. A negative result may occur with  improper specimen collection/handling, submission of specimen other than nasopharyngeal swab, presence of viral mutation(s) within the areas targeted by this assay, and inadequate number of viral copies(<138 copies/mL). A negative result must be combined with clinical observations, patient history, and epidemiological information. The expected result is Negative.  Fact Sheet for Patients:  EntrepreneurPulse.com.au  Fact Sheet for Healthcare Providers:  IncredibleEmployment.be  This test is no t yet approved or cleared by the [place] FDA and  has been authorized for detection and/or diagnosis of SARS-CoV-2 by FDA under an Emergency Use Authorization (EUA).  This EUA will remain  in effect (meaning this test can be used) for the duration of the COVID-19 declaration under Section 564(b)(1) of the Act, 21 U.S.C.section 360bbb-3(b)(1), unless the authorization is terminated  or revoked sooner.       Influenza A by  PCR NEGATIVE NEGATIVE Final   Influenza B by PCR NEGATIVE NEGATIVE Final    Comment: (NOTE) The Xpert Xpress SARS-CoV-2/FLU/RSV plus assay is intended as an aid in the diagnosis of influenza from Nasopharyngeal swab specimens and should not be used as a sole basis for treatment. Nasal washings and aspirates are unacceptable for Xpert Xpress SARS-CoV-2/FLU/RSV testing.  Fact Sheet for Patients: EntrepreneurPulse.com.au  Fact Sheet for Healthcare Providers: IncredibleEmployment.be  This test is not yet approved or cleared by the [place] FDA and has been authorized for detection and/or diagnosis of SARS-CoV-2 by FDA under an Emergency Use Authorization (EUA). This EUA will remain in effect (meaning this test can be used) for the duration of the COVID-19 declaration under Section 564(b)(1) of the Act, 21 U.S.C. section 360bbb-3(b)(1), unless the authorization is terminated or revoked.  Performed at [facility], [place] [address], [place], [place] [address]   Surgical pcr screen     Status: None   Collection Time: 01/31/20 11:40 AM   Specimen: Nasal Mucosa; Nasal Swab  Result Value Ref Range Status   MRSA, PCR NEGATIVE NEGATIVE Final   Staphylococcus aureus NEGATIVE NEGATIVE Final    Comment: (NOTE) The Xpert SA Assay (FDA approved for NASAL specimens in patients 54 years of age and older), is one component of a comprehensive surveillance program. It is not intended to diagnose infection nor to guide or monitor treatment. Performed at [facility], [place] [address] [place], [place] [address]     RN Pressure Injury Documentation:     Estimated body mass index is 48.12 kg/m as calculated from the following:   Height as of this encounter: 6\' 2"  (1.88 m).   Weight as of this encounter: 170 kg.  Malnutrition Type:    Malnutrition Characteristics:   Nutrition Interventions:     Radiology Studies: No results  found. Scheduled Meds:  apixaban  5 mg Oral BID   dicyclomine  20 mg Oral BID   docusate sodium  100 mg Oral BID   DULoxetine  60 mg Oral Daily   gabapentin  600 mg Oral TID   metoprolol tartrate  25 mg Oral QHS   metoprolol tartrate  50 mg Oral Daily   pantoprazole  40 mg Oral Daily   simvastatin  20 mg Oral QHS   tamsulosin  0.4 mg Oral QHS   traMADol  50 mg Oral Q6H   vitamin B-12  2,500 mcg Oral Daily   Continuous Infusions:  sodium chloride 75 mL/hr at 02/03/20 0318   methocarbamol (ROBAXIN) IV      LOS: 3 days   Kerney Elbe, DO [facility] PAGER is on [place]  If 7PM-7AM, please contact night-coverage [email or web address]

## 2020-02-03 NOTE — Care Management Important Message (Signed)
Important Message  Patient Details  Name: David Fitzgerald MRN: [id number] Date of Birth: 1960-06-15   Medicare Important Message Given:  Yes     Dorena Bodo 02/03/2020, 3:30 PM

## 2020-02-03 NOTE — TOC Progression Note (Signed)
Transition of Care [facility]) - Progression Note    Patient Details  Name: David Fitzgerald MRN: [id number] Date of Birth: [birth year]  Transition of Care [facility]) CM/SW Contact  Janae Bridgeman, RN Phone Number: 02/03/2020, 9:36 AM  Clinical Narrative:    Case management noted that patient was declined by the two facilities preferred by the patient due to bed availability.  I faxed the patient out to other SNF facilities to continue to seek placement for the patient for short term rehabilitation.  Case management will continue to follow for SNF availability.    Expected Discharge Plan: Skilled Nursing Facility Barriers to Discharge: Continued Medical Work up  Expected Discharge Plan and Services Expected Discharge Plan: Skilled Nursing Facility   Discharge Planning Services: CM Consult Post Acute Care Choice: Skilled Nursing Facility Living arrangements for the past 2 months: Single Family Home                                       Social Determinants of Health (SDOH) Interventions    Readmission Risk Interventions Readmission Risk Prevention Plan 02/02/2020  Post Dischage Appt Complete  Medication Screening Complete  Transportation Screening Complete  Some recent data might be hidden

## 2020-02-04 ENCOUNTER — Inpatient Hospital Stay (HOSPITAL_COMMUNITY): Payer: Medicare HMO

## 2020-02-04 DIAGNOSIS — E872 Acidosis: Secondary | ICD-10-CM | POA: Diagnosis not present

## 2020-02-04 DIAGNOSIS — I4821 Permanent atrial fibrillation: Secondary | ICD-10-CM | POA: Diagnosis not present

## 2020-02-04 DIAGNOSIS — S82892B Other fracture of left lower leg, initial encounter for open fracture type I or II: Secondary | ICD-10-CM | POA: Diagnosis not present

## 2020-02-04 LAB — CBC WITH DIFFERENTIAL/PLATELET
Abs Immature Granulocytes: 0.1 10*3/uL — ABNORMAL HIGH (ref 0.00–0.07)
Basophils Absolute: 0.1 10*3/uL (ref 0.0–0.1)
Basophils Relative: 1 %
Eosinophils Absolute: 0.3 10*3/uL (ref 0.0–0.5)
Eosinophils Relative: 3 %
HCT: 40.4 % (ref 39.0–52.0)
Hemoglobin: 12.8 g/dL — ABNORMAL LOW (ref 13.0–17.0)
Immature Granulocytes: 1 %
Lymphocytes Relative: 23 %
Lymphs Abs: 2.3 10*3/uL (ref 0.7–4.0)
MCH: 29.9 pg (ref 26.0–34.0)
MCHC: 31.7 g/dL (ref 30.0–36.0)
MCV: 94.4 fL (ref 80.0–100.0)
Monocytes Absolute: 0.9 10*3/uL (ref 0.1–1.0)
Monocytes Relative: 9 %
Neutro Abs: 6.1 10*3/uL (ref 1.7–7.7)
Neutrophils Relative %: 63 %
Platelets: 239 10*3/uL (ref 150–400)
RBC: 4.28 MIL/uL (ref 4.22–5.81)
RDW: 15.5 % (ref 11.5–15.5)
WBC: 9.7 10*3/uL (ref 4.0–10.5)
nRBC: 0 % (ref 0.0–0.2)

## 2020-02-04 LAB — COMPREHENSIVE METABOLIC PANEL
ALT: 21 U/L (ref 0–44)
AST: 22 U/L (ref 15–41)
Albumin: 3.2 g/dL — ABNORMAL LOW (ref 3.5–5.0)
Alkaline Phosphatase: 72 U/L (ref 38–126)
Anion gap: 13 (ref 5–15)
BUN: 14 mg/dL (ref 6–20)
CO2: 26 mmol/L (ref 22–32)
Calcium: 8.7 mg/dL — ABNORMAL LOW (ref 8.9–10.3)
Chloride: 100 mmol/L (ref 98–111)
Creatinine, Ser: 0.91 mg/dL (ref 0.61–1.24)
GFR, Estimated: >60 mL/min (ref >60)
Glucose, Bld: 130 mg/dL — ABNORMAL HIGH (ref 70–99)
Potassium: 3.4 mmol/L — ABNORMAL LOW (ref 3.5–5.1)
Sodium: 139 mmol/L (ref 135–145)
Total Bilirubin: 0.8 mg/dL (ref 0.3–1.2)
Total Protein: 7.4 g/dL (ref 6.5–8.1)

## 2020-02-04 LAB — RETICULOCYTES
Immature Retic Fract: 27.1 % — ABNORMAL HIGH (ref 2.3–15.9)
RBC.: 4.17 MIL/uL — ABNORMAL LOW (ref 4.22–5.81)
Retic Count, Absolute: 116.8 10*3/uL (ref 19.0–186.0)
Retic Ct Pct: 2.8 % (ref 0.4–3.1)

## 2020-02-04 LAB — IRON AND TIBC
Iron: 39 ug/dL — ABNORMAL LOW (ref 45–182)
Saturation Ratios: 10 % — ABNORMAL LOW (ref 17.9–39.5)
TIBC: 399 ug/dL (ref 250–450)
UIBC: 360 ug/dL

## 2020-02-04 LAB — HEMOGLOBIN A1C
Hgb A1c MFr Bld: 6 % — ABNORMAL HIGH (ref 4.8–5.6)
Mean Plasma Glucose: 125.5 mg/dL

## 2020-02-04 LAB — FOLATE: Folate: 9.1 ng/mL (ref >5.9)

## 2020-02-04 LAB — MAGNESIUM: Magnesium: 2 mg/dL (ref 1.7–2.4)

## 2020-02-04 LAB — PHOSPHORUS: Phosphorus: 3.6 mg/dL (ref 2.5–4.6)

## 2020-02-04 LAB — VITAMIN B12: Vitamin B-12: 1704 pg/mL — ABNORMAL HIGH (ref 180–914)

## 2020-02-04 LAB — FERRITIN: Ferritin: 68 ng/mL (ref 24–336)

## 2020-02-04 MED ORDER — LOPERAMIDE HCL 2 MG PO CAPS
2.0000 mg | ORAL_CAPSULE | ORAL | Status: DC | PRN
  Administered 2020-02-04: 2 mg via ORAL
  Filled 2020-02-04: qty 1

## 2020-02-04 MED ORDER — POTASSIUM CHLORIDE CRYS ER 20 MEQ PO TBCR
40.0000 meq | EXTENDED_RELEASE_TABLET | Freq: Two times a day (BID) | ORAL | Status: AC
  Administered 2020-02-04 (×2): 40 meq via ORAL
  Filled 2020-02-04 (×2): qty 2

## 2020-02-04 MED ORDER — POLYSACCHARIDE IRON COMPLEX 150 MG PO CAPS
150.0000 mg | ORAL_CAPSULE | Freq: Every day | ORAL | Status: DC
  Administered 2020-02-04 – 2020-02-16 (×13): 150 mg via ORAL
  Filled 2020-02-04 (×13): qty 1

## 2020-02-04 NOTE — Progress Notes (Signed)
PROGRESS NOTE    David Fitzgerald  [id number] DOB: 10-28-1960 DOA: 01/31/2020 PCP: Angelina Sheriff, MD   Brief Narrative:  HPI per Dr. Wynetta Fines on 01/31/20  HPI: David Fitzgerald is a 59 y.o. male with medical history significant of paroxysmal A. fib on Eliquis, IIDM, diabetic neuropathy, morbid obesity, BPH, venous insufficiency on as needed Lasix, presented with mechanical fall and left ankle fracture.  Patient woke up at night, and tripped and fell on the left leg and ankle, he said he was a little unsteady but denied any prodromes of lightheadedness chest pain or shortness of breath.  Sustained a open fracture of left ankle and came to ED. ED Course: X-ray showed acute left malleolus fracture with mild dislocation of left ankle.  Patient shifted to the OR to have ORIF of left ankle.  In PACU, patient developed in and out uncontrolled A. fib, responded to IV Lopressor.  Blood pressure stable, elevated lactic acid 2.6, WBC WNL.  **Interim History  Patient's heart rate is improved and he is status post ORIF for his left ankle.  PT OT evaluating and recommending SNF.  Patient continues to have a lactic acidosis so we will give him more fluids. Last lactic acid level 3.2 and slightly trending up but has been fluctuating in the 2-3 range but now is finally resolved. He was given 2 L of boluses and was continued on IV fluid hydration but maintenance IVF has now stopped. He is stable to be discharged to a SNF but bed availability is the current barrier for SNF.   Assessment & Plan:   Active Problems:   Open fracture dislocation of left ankle   Ankle fracture   Permanent atrial fibrillation (HCC)   Chronic A Fib now with RVR, improved -Patient appears to be hypovolemic, will give a short course 12 hours IV fluid; Now IVF resumed yesterday and will c/w NS at 75 mL/hr for now given his lactic acidosis -Rate control wise, responded to IV metoprolol in ED and PACU, discussed with surgeon and  PACU nurse, patient appears to be stable to swallow and eat, will resume home metoprolol.  -PRN IV metoprolol 2.5 mg IV every 6 as needed for heart rate greater than 110 -Continue with metoprolol tartrate 25 mg p.o. nightly -Surgeon agreed to restart Eliquis and now back on Apixaban 5 mg po BID and he is tolerating it well  -Make K>4, and check magnesium level -Continue to monitor on telemetry  Open fracture of left ankle -Status post ORIF -Antibiotics and postop care as per surgery team -Continue with acetaminophen 325-650 mg p.o. every 6 as needed for mild pain, hydrocodone-acetaminophen 1-2 tabs p.o. every 4 as needed moderate pain, as well as hydromorphone IV 0.5-1 mg IV every 4 as needed for severe pain next-also continue with methocarbamol 500 g p.o./IV every 6 as needed for muscle spasms; patient also has oxycodone IR as well as tramadol and will need to consolidate his pain medications but will leave this to orthopedic surgery -Bowel regimen with bisacodyl 10 mg rectally, docusate 100 mg p.o. twice daily, and a Fleet enema -C/w Apixaban 5 mg po BID for VTE prophylaxis  -Orthopedic recommending nonweightbearing and following up with Dr. Marcelino Scot as an outpatient -Further care per Ortho and PT OT recommending skilled nursing facility; from a medical standpoint he is stable to be discharged however the facilities that he requested do not have bed availability so currently bed availability is still pending  Lactic Acidosis,  improved -Patient's lactic acid level went from 2.6 and trended up to 3.9 is now trending down to 2.6 yesterday but then went back up to 2.8; today lactic acid level was 3.2 -Continue IV fluid hydration with normal saline at 75 MLS per hour we will hold his Metformin -He will be given two 1 L normal saline boluses yesterday  -Continue monitor and will not trend lactic acid level anymore given that his lactic acid level today was 1.5  Leukocytosis -Likely in setting of his  surgery and less likely infection given that he is afebrile and has no symptoms -WBC went from 9.2 -> 14.7 -> 13.5 -> 12.0 -> 9.7 -Continue to monitor for signs and symptoms of infection; currently no overt infection noted -Continue monitor and trend and repeat CMP in the a.m.  Renal Insufficiency rule out chronic kidney disease, improved -Patient's BUNs/creatinine went from 13/1.00 and is now 12/1.26 and is now trended back down with IV fluid hydration is now 14/0.91 -IV fluid hydration now stopped  -Avoid nephrotoxic medications, contrast dyes, hypotension and renally dose medications -Continue monitor renal function closely and repeat CMP in a.m.  OSA -CPAP HS  IIDM -Because he has a Lactic Acidosis will hold and stop Metformin for now -Blood sugars have been ranging from 127-222 on daily BMP/CMP; This AM was 130 -Checked Hemoglobin A1c this AM nd was 6.0 -If necessary will be placed on a moderate NovoLog sign scale insulin before meals and at bedtime  Hyperlipidemia -Continue Simvastatin 20 g p.o. nightly  BPH -Continue Tamsulosin 0.4 mg p.o. nightly  GERD -Continue with Pantoprazole 40 mg p.o. daily  Diabetic Neuropathy  -Resume Gabapentin 600 mg po TID  Normocytic Anemia -Patient's hemoglobin/hematocrit went from 13.6/40.0 ->12.4/37.4  ->13.2/41.7 -> 12.8/40.4 -Checked Anemia Panel and it showed of 39, U IBC level of 360, TIBC of 399, saturation ratios of 10%, ferritin level 68, folate level of 9.1, and vitamin B12 level of 1704 -Niferex 151 p.o. daily -Continue to monitor for signs and symptoms of bleeding; currently no overt bleeding noted -Repeat CBC in a.m.  Hypokalemia -Patient's K+ this AM was 3.4 -Replete with po KCl 40 mEQ BID x2 Doses -Continue to Monitor and Replete as Necessary -Repeat CMP in the AM  Left Hip Pain and Soreness -Has a small bruise and will obtain a DG hip unilateral with pelvis on the left 2-3 views  Obesity -Complicates overall  prognosis and care -Estimated body mass index is 48.12 kg/m as calculated from the following:   Height as of this encounter: 6\' 2"  (1.88 m).   Weight as of this encounter: 170 kg. -Weight loss and Dietary Counseling given  DVT prophylaxis: Per Orthopedic Surgery  Code Status: FULL CODE  Family Communication: No family present at bedside  Disposition Plan: SNF when cleared by orthopedic surgery and bed is available; Lactic Acidosis has resolved  Status is: Inpatient  Remains inpatient appropriate because:Unsafe d/c plan, IV treatments appropriate due to intensity of illness or inability to take PO and Inpatient level of care appropriate due to severity of illness   Dispo: The patient is from: Home              Anticipated d/c is to: SNF              Anticipated d/c date is: 2 days              Patient currently is not medically stable to d/c.  Consultants:   Orthopedic Surgery  Procedures:  PROCEDURES: 1.  Open reduction internal fixation of left ankle trimalleolar fracture without fixation of the posterior lip. 2.  Open treatment of left ankle dislocation with K-wire fixation of the tibiotalar joint. 3.  Open reduction internal fixation of left ankle syndesmosis. 4.  Debridement of open fracture dislocation including skin, subcutaneous tissue, fascia and bone. 5.  Stress fluoroscopy, left ankle.  Antimicrobials: Anti-infectives (From admission, onward)   Start     Dose/Rate Route Frequency Ordered Stop   02/01/20 0100  ceFAZolin (ANCEF) IVPB 2g/100 mL premix        2 g 200 mL/hr over 30 Minutes Intravenous Every 8 hours 02/01/20 0019 02/01/20 1733   01/31/20 1543  vancomycin (VANCOCIN) powder  Status:  Discontinued          As needed 01/31/20 1544 01/31/20 1638   01/31/20 0430  clindamycin (CLEOCIN) IVPB 900 mg  Status:  Discontinued        900 mg 100 mL/hr over 30 Minutes Intravenous Every 8 hours 01/31/20 0408 02/01/20 0017   01/31/20 0315  ceFAZolin (ANCEF) IVPB  2g/100 mL premix        2 g 200 mL/hr over 30 Minutes Intravenous  Once 01/31/20 0308 01/31/20 0547       Subjective: Seen and examined at bedside and is doing relatively well and still felt a little soreness in his hip so we will order an x-ray of his left hip.  He had a small bruise on it that was painful for palpation.  Or vomiting.  States that he was doing fairly well.  No other concerns or questions this time and denies chest pain lightheadedness or dizziness.  Objective: Vitals:   02/03/20 2014 02/03/20 2231 02/03/20 2231 02/04/20 0419  BP: (!) 158/89 133/68 133/68 (!) 166/93  Pulse: 69 86 70 77  Resp: 18  20 18   Temp: 97.9 F (36.6 C)  98 F (36.7 C) 98.1 F (36.7 C)  TempSrc: Oral  Oral Oral  SpO2: 93%  96% 92%  Weight:      Height:        Intake/Output Summary (Last 24 hours) at 02/04/2020 0853 Last data filed at 02/04/2020 0543 Gross per 24 hour  Intake 1103.75 ml  Output 5350 ml  Net -4246.25 ml   Filed Weights   01/31/20 0255 01/31/20 0308  Weight: (!) 170 kg (!) 170 kg   Examination: Physical Exam:  Constitutional: WN/WD obese Caucasian male currently no acute distress sitting up in the bed watching television and feels better than he did yesterday but still has some very slight hip soreness Eyes: Lids and conjunctivae normal, sclerae anicteric  ENMT: External Ears, Nose appear normal. Grossly normal hearing.  Neck: Appears normal, supple, no cervical masses, normal ROM, no appreciable thyromegaly; no JVD Respiratory: Diminished to auscultation bilaterally, no wheezing, rales, rhonchi or crackles. Normal respiratory effort and patient is not tachypenic. No accessory muscle use.  Unlabored breathing Cardiovascular: RRR, no murmurs / rubs / gallops. S1 and S2 auscultated.  Minimal extremity edema Abdomen: Soft, non-tender, distended secondary body habitus. Bowel sounds positive.  GU: Deferred. Musculoskeletal: No clubbing / cyanosis of digits/nails. No joint  deformity upper and lower extremities but left ankle is wrapped in Ace bandage Skin: Has a very small bruise on his left hip.  No appreciable rashes or lesions on limited skin evaluation. No induration; Warm and dry.  Neurologic: CN 2-12 grossly intact with no focal deficits. Romberg sign and cerebellar reflexes not assessed.  Psychiatric: Normal judgment and insight. Alert and oriented x 3. Normal mood and appropriate affect.   Data Reviewed: I have personally reviewed following labs and imaging studies  CBC: Recent Labs  Lab 01/31/20 0320 01/31/20 0329 02/01/20 0211 02/02/20 0233 02/03/20 0147 02/04/20 0255  WBC 9.2  --  14.7* 13.5* 12.0* 9.7  NEUTROABS  --   --   --  10.2* 7.9* 6.1  HGB 14.6 15.0 13.6 12.4* 13.2 12.8*  HCT 43.6 44.0 40.0 37.4* 41.7 40.4  MCV 92.2  --  91.3 94.2 95.0 94.4  PLT 213  --  283 252 240 [id number]   Basic Metabolic Panel: Recent Labs  Lab 01/31/20 0320 01/31/20 0329 01/31/20 1912 02/01/20 0211 02/02/20 0233 02/03/20 0147 02/04/20 0255  NA 137 138  --  137 140 138 139  K 3.8 3.8  --  4.0 4.4 3.7 3.4*  CL 100 99  --  100 102 101 100  CO2 26  --   --  25 28 26 26   GLUCOSE 165* 155*  --  222* 147* 127* 130*  BUN 11 13  --  12 15 15 14   CREATININE 1.12 1.00  --  1.26* 1.02 1.01 0.91  CALCIUM 8.9  --   --  8.9 8.9 8.5* 8.7*  MG  --   --  2.1  --  2.1 1.9 2.0  PHOS  --   --  2.9  --  3.4 3.1 3.6   GFR: Estimated Creatinine Clearance: 143.2 mL/min (by C-G formula based on SCr of 0.91 mg/dL). Liver Function Tests: Recent Labs  Lab 01/31/20 0320 02/02/20 0233 02/03/20 0147 02/04/20 0255  AST 29 18 23 22   ALT 24 16 17 21   ALKPHOS 66 54 58 72  BILITOT 0.7 0.3 0.5 0.8  PROT 7.2 7.2 7.0 7.4  ALBUMIN 3.4* 3.3* 3.3* 3.2*   No results for input(s): LIPASE, AMYLASE in the last 168 hours. No results for input(s): AMMONIA in the last 168 hours. Coagulation Profile: Recent Labs  Lab 01/31/20 0320  INR 1.1   Cardiac Enzymes: No results for  input(s): CKTOTAL, CKMB, CKMBINDEX, TROPONINI in the last 168 hours. BNP (last 3 results) No results for input(s): PROBNP in the last 8760 hours. HbA1C: Recent Labs    02/04/20 0255  HGBA1C 6.0*   CBG: Recent Labs  Lab 01/31/20 1147 01/31/20 1643  GLUCAP 121* 173*   Lipid Profile: No results for input(s): CHOL, HDL, LDLCALC, TRIG, CHOLHDL, LDLDIRECT in the last 72 hours. Thyroid Function Tests: No results for input(s): TSH, T4TOTAL, FREET4, T3FREE, THYROIDAB in the last 72 hours. Anemia Panel: Recent Labs    02/04/20 0255  VITAMINB12 1,704*  FOLATE 9.1  FERRITIN 68  TIBC 399  IRON 39*  RETICCTPCT 2.8   Sepsis Labs: Recent Labs  Lab 02/01/20 1250 02/02/20 0855 02/02/20 1344 02/03/20 0147  LATICACIDVEN 2.8* 3.2* 3.3* 1.5    Recent Results (from the past 240 hour(s))  Resp Panel by RT-PCR (Flu A&B, Covid) Nasopharyngeal Swab     Status: None   Collection Time: 01/31/20  3:15 AM   Specimen: Nasopharyngeal Swab; Nasopharyngeal(NP) swabs in vial transport medium  Result Value Ref Range Status   SARS Coronavirus 2 by RT PCR NEGATIVE NEGATIVE Final    Comment: (NOTE) SARS-CoV-2 target nucleic acids are NOT DETECTED.  The SARS-CoV-2 RNA is generally detectable in upper respiratory specimens during the acute phase of infection. The lowest concentration of SARS-CoV-2 viral copies this assay can detect is 138  copies/mL. A negative result does not preclude SARS-Cov-2 infection and should not be used as the sole basis for treatment or other patient management decisions. A negative result may occur with  improper specimen collection/handling, submission of specimen other than nasopharyngeal swab, presence of viral mutation(s) within the areas targeted by this assay, and inadequate number of viral copies(<138 copies/mL). A negative result must be combined with clinical observations, patient history, and epidemiological information. The expected result is Negative.  Fact  Sheet for Patients:  BloggerCourse.com  Fact Sheet for Healthcare Providers:  SeriousBroker.it  This test is no t yet approved or cleared by the [place] FDA and  has been authorized for detection and/or diagnosis of SARS-CoV-2 by FDA under an Emergency Use Authorization (EUA). This EUA will remain  in effect (meaning this test can be used) for the duration of the COVID-19 declaration under Section 564(b)(1) of the Act, 21 U.S.C.section 360bbb-3(b)(1), unless the authorization is terminated  or revoked sooner.       Influenza A by PCR NEGATIVE NEGATIVE Final   Influenza B by PCR NEGATIVE NEGATIVE Final    Comment: (NOTE) The Xpert Xpress SARS-CoV-2/FLU/RSV plus assay is intended as an aid in the diagnosis of influenza from Nasopharyngeal swab specimens and should not be used as a sole basis for treatment. Nasal washings and aspirates are unacceptable for Xpert Xpress SARS-CoV-2/FLU/RSV testing.  Fact Sheet for Patients: BloggerCourse.com  Fact Sheet for Healthcare Providers: SeriousBroker.it  This test is not yet approved or cleared by the [place] FDA and has been authorized for detection and/or diagnosis of SARS-CoV-2 by FDA under an Emergency Use Authorization (EUA). This EUA will remain in effect (meaning this test can be used) for the duration of the COVID-19 declaration under Section 564(b)(1) of the Act, 21 U.S.C. section 360bbb-3(b)(1), unless the authorization is terminated or revoked.  Performed at [facility], [address] [place], [place] [address]   Surgical pcr screen     Status: None   Collection Time: 01/31/20 11:40 AM   Specimen: Nasal Mucosa; Nasal Swab  Result Value Ref Range Status   MRSA, PCR NEGATIVE NEGATIVE Final   Staphylococcus aureus NEGATIVE NEGATIVE Final    Comment: (NOTE) The Xpert SA Assay (FDA approved for NASAL  specimens in patients 34 years of age and older), is one component of a comprehensive surveillance program. It is not intended to diagnose infection nor to guide or monitor treatment. Performed at [facility], [address] [place], [place] [address]   Resp Panel by RT-PCR (Flu A&B, Covid) Nasopharyngeal Swab     Status: None   Collection Time: 02/03/20  1:33 PM   Specimen: Nasopharyngeal Swab; Nasopharyngeal(NP) swabs in vial transport medium  Result Value Ref Range Status   SARS Coronavirus 2 by RT PCR NEGATIVE NEGATIVE Final    Comment: (NOTE) SARS-CoV-2 target nucleic acids are NOT DETECTED.  The SARS-CoV-2 RNA is generally detectable in upper respiratory specimens during the acute phase of infection. The lowest concentration of SARS-CoV-2 viral copies this assay can detect is 138 copies/mL. A negative result does not preclude SARS-Cov-2 infection and should not be used as the sole basis for treatment or other patient management decisions. A negative result may occur with  improper specimen collection/handling, submission of specimen other than nasopharyngeal swab, presence of viral mutation(s) within the areas targeted by this assay, and inadequate number of viral copies(<138 copies/mL). A negative result must be combined with clinical observations, patient history, and  epidemiological information. The expected result is Negative.  Fact Sheet for Patients:  EntrepreneurPulse.com.au  Fact Sheet for Healthcare Providers:  IncredibleEmployment.be  This test is no t yet approved or cleared by the [place] FDA and  has been authorized for detection and/or diagnosis of SARS-CoV-2 by FDA under an Emergency Use Authorization (EUA). This EUA will remain  in effect (meaning this test can be used) for the duration of the COVID-19 declaration under Section 564(b)(1) of the Act, 21 U.S.C.section 360bbb-3(b)(1), unless the authorization  is terminated  or revoked sooner.       Influenza A by PCR NEGATIVE NEGATIVE Final   Influenza B by PCR NEGATIVE NEGATIVE Final    Comment: (NOTE) The Xpert Xpress SARS-CoV-2/FLU/RSV plus assay is intended as an aid in the diagnosis of influenza from Nasopharyngeal swab specimens and should not be used as a sole basis for treatment. Nasal washings and aspirates are unacceptable for Xpert Xpress SARS-CoV-2/FLU/RSV testing.  Fact Sheet for Patients: EntrepreneurPulse.com.au  Fact Sheet for Healthcare Providers: IncredibleEmployment.be  This test is not yet approved or cleared by the [place] FDA and has been authorized for detection and/or diagnosis of SARS-CoV-2 by FDA under an Emergency Use Authorization (EUA). This EUA will remain in effect (meaning this test can be used) for the duration of the COVID-19 declaration under Section 564(b)(1) of the Act, 21 U.S.C. section 360bbb-3(b)(1), unless the authorization is terminated or revoked.  Performed at [facility], [place] [address], [place],  [address]     RN Pressure Injury Documentation:     Estimated body mass index is 48.12 kg/m as calculated from the following:   Height as of this encounter: 6\' 2"  (1.88 m).   Weight as of this encounter: 170 kg.  Malnutrition Type:    Malnutrition Characteristics:   Nutrition Interventions:     Radiology Studies: No results found. Scheduled Meds: . apixaban  5 mg Oral BID  . dicyclomine  20 mg Oral BID  . docusate sodium  100 mg Oral BID  . DULoxetine  60 mg Oral Daily  . gabapentin  600 mg Oral TID  . metoprolol tartrate  25 mg Oral QHS  . metoprolol tartrate  50 mg Oral Daily  . pantoprazole  40 mg Oral Daily  . potassium chloride  40 mEq Oral BID  . simvastatin  20 mg Oral QHS  . tamsulosin  0.4 mg Oral QHS  . traMADol  50 mg Oral Q6H  . vitamin B-12  2,500 mcg Oral Daily   Continuous Infusions: . methocarbamol  (ROBAXIN) IV      LOS: 4 days   Kerney Elbe, DO [facility] PAGER is on AMION  If 7PM-7AM, please contact night-coverage [email or web address]

## 2020-02-04 NOTE — Progress Notes (Signed)
OT Cancellation Note  Patient Details Name: David Fitzgerald MRN: [id number] DOB: 1961-01-23   Cancelled Treatment:    Reason Eval/Treat Not Completed: Patient at procedure or test/ unavailable. Pt off unit for CT. Pt's mother in room. OT left UE HEP and theraband for pt to look over with OT to follow-up for session as time allows.   Lorre Munroe 02/04/2020, 1:08 PM

## 2020-02-04 NOTE — Progress Notes (Signed)
Patient refused CPAP at this time. Patient aware to let Respiratory know if he desires to use CPAP during his hospital stay.

## 2020-02-04 NOTE — TOC Progression Note (Signed)
Transition of Care [facility]) - Progression Note    Patient Details  Name: David Fitzgerald MRN: [id number] Date of Birth: August 04, 1960  [facility]) CM/SW Contact  Janae Bridgeman, RN Phone Number: 02/04/2020, 3:26 PM  Clinical Narrative:    Case management offered Medicare choice to the patient and family and they chose [facility] considering it was the only accepting facility.  I called and spoke with [facility] and they will hold a bed for the patient.  Insurance authorization will be delayed until Monday since the insurance companies are closed.  Patient is fully vaccinated.   Expected Discharge Plan: Skilled Nursing Facility Barriers to Discharge: Continued Medical Work up  Expected Discharge Plan and Services Expected Discharge Plan: Skilled Nursing Facility   Discharge Planning Services: CM Consult Post Acute Care Choice: Skilled Nursing Facility Living arrangements for the past 2 months: Single Family Home                                       Social Determinants of Health (SDOH) Interventions    Readmission Risk Interventions Readmission Risk Prevention Plan 02/02/2020  Post Dischage Appt Complete  Medication Screening Complete  Transportation Screening Complete  Some recent data might be hidden

## 2020-02-04 NOTE — Progress Notes (Signed)
Physical Therapy Treatment Patient Details Name: David Fitzgerald MRN: [id number] DOB: 05-18-1960 Today's Date: 02/04/2020    History of Present Illness David Fitzgerald is a 59 y.o. male with medical history significant of paroxysmal A. fib on Eliquis, IIDM, diabetic neuropathy, morbid obesity, BPH, venous insufficiency on as needed Lasix, presented with mechanical fall and left ankle fracture. Now s/p ORIF, NWB. Plan for x-ray 12/31.    PT Comments    Pt seated EOB on staff arrival to room, his mother was present and encouraging. He was motivated to progress functional mobility and able to progress gait distance to 33ft x2 using RW and min guard for safety, able to maintain NWB LLE with minimal cues. Mostly he needed cues for safety with transfers and activity pacing. Reviewed log rolling with pt for increased comfort with bed mobility as he is reporting increased back pain. Pt given HEP handout (link: [email or web address] Access Code: [email or web address]), encouraged to complete BID/TID for LLE strengthening while in NWB status. Pt continues to benefit from skilled rehab in a post acute setting to maximize functional gains before returning home.  Follow Up Recommendations  SNF;Supervision/Assistance - 24 hour     Equipment Recommendations  Rolling walker with 5" wheels;3in1 (PT);Wheelchair (measurements PT);Wheelchair cushion (measurements PT)    Recommendations for Other Services       Precautions / Restrictions Precautions Precautions: Fall Restrictions Weight Bearing Restrictions: Yes LLE Weight Bearing: Non weight bearing    Mobility  Bed Mobility Overal bed mobility: Needs Assistance Bed Mobility: Sidelying to Sit;Sit to Sidelying   Sidelying to sit: Min guard     Sit to sidelying: Supervision General bed mobility comments: pt seated EOB on PTA arrival to room but did c/o back/sciatic pain, so reviewed log rolling and pt able to perform via demo/teachback, may need  reinforcement for proper technique  Transfers Overall transfer level: Needs assistance Equipment used: Rolling walker (2 wheeled) Judie Petit) Transfers: Sit to/from Stand Sit to Stand: Min assist;Min guard         General transfer comment: Verbal and demo cues for hand placement and safety; Min assist to steady RW during rise; Able to keep L foot in the air during that transition but sometimes forgets to lift leg up and needs vcs; good support on RW; close guard as well to ensure NWB LLE;  Ambulation/Gait Ambulation/Gait assistance: Min guard Gait Distance (Feet): 20 Feet (x2, then 69ft (seated breaks between)) Assistive device: Rolling walker (2 wheeled) (bariatric) Gait Pattern/deviations: Step-to pattern (hop-to pattern)     General Gait Details: good compliance with NWB status during gait, needs reminders to keep LLE up prior to sitting; fatigues after ~30ft   Stairs             Wheelchair Mobility    Modified Rankin (Stroke Patients Only)       Balance Overall balance assessment: Needs assistance Sitting-balance support: No upper extremity supported;Feet supported Sitting balance-[name]: Good     Standing balance support: Bilateral upper extremity supported;During functional activity Standing balance-[name]: Poor Standing balance comment: reliant on external support due to NWB                            Cognition Arousal/Alertness: Awake/alert Behavior During Therapy: WFL for tasks assessed/performed Overall Cognitive Status: Within Functional Limits for tasks assessed  General Comments: Very pleasant and eager for mobility      Exercises Other Exercises Other Exercises: Reviewed HEP handout, including ankle pumps (RLE only), quad sets, SLR, HS, hip add with pillow, shoulder alphabet, chair push-ups, pt receptive to instruction (link: HEP: [email or web address] Access Code: [email or web address])     General Comments General comments (skin integrity, edema, etc.): SpO2 WNL, HR 80-90's during mobility      Pertinent Vitals/Pain Pain Assessment: Faces Faces Pain Scale: Hurts little more Pain Location: back pain Pain Descriptors / Indicators: Grimacing Pain Intervention(s): Monitored during session;Repositioned (cues for log rolling)    Home Living                      Prior Function            PT Goals (current goals can now be found in the care plan section) Acute Rehab PT Goals Patient Stated Goal: Wants to be as independent as possible when he gets home PT Goal Formulation: With patient Time For Goal Achievement: 02/15/20 Potential to Achieve Goals: Good Progress towards PT goals: Progressing toward goals    Frequency    Min 2X/week      PT Plan Current plan remains appropriate    Co-evaluation              AM-PAC PT "6 Clicks" Mobility   Outcome Measure  Help needed turning from your back to your side while in a flat bed without using bedrails?: None Help needed moving from lying on your back to sitting on the side of a flat bed without using bedrails?: None Help needed moving to and from a bed to a chair (including a wheelchair)?: A Little Help needed standing up from a chair using your arms (e.g., wheelchair or bedside chair)?: A Little Help needed to walk in hospital room?: A Little Help needed climbing 3-5 steps with a railing? : Total 6 Click Score: 18    End of Session Equipment Utilized During Treatment: Gait belt Activity Tolerance: Patient tolerated treatment well Patient left: in bed;with call bell/phone within reach (transport arriving to take pt to xray) Nurse Communication: Mobility status PT Visit Diagnosis: Unsteadiness on feet (R26.81);Other abnormalities of gait and mobility (R26.89);Other (comment)     Time: 1308-6578 PT Time Calculation (min) (ACUTE ONLY): 34 min  Charges:  $Gait Training: 8-22 mins $Therapeutic  Activity: 8-22 mins                     Maleek Craver P., PTA Acute Rehabilitation Services Pager: [phone] Office: [phone]   Dorathy Kinsman Melora Menon 02/04/2020, 1:32 PM

## 2020-02-04 NOTE — Plan of Care (Signed)
  Problem: Education: Goal: Knowledge of General Education information will improve Description: Including pain rating scale, medication(s)/side effects and non-pharmacologic comfort measures Outcome: Progressing   Problem: Clinical Measurements: Goal: Ability to maintain clinical measurements within normal limits will improve Outcome: Progressing Goal: Will remain free from infection Outcome: Progressing   Problem: Nutrition: Goal: Adequate nutrition will be maintained Outcome: Progressing   Problem: Coping: Goal: Level of anxiety will decrease Outcome: Progressing   

## 2020-02-05 DIAGNOSIS — I4821 Permanent atrial fibrillation: Secondary | ICD-10-CM | POA: Diagnosis not present

## 2020-02-05 DIAGNOSIS — E872 Acidosis: Secondary | ICD-10-CM | POA: Diagnosis not present

## 2020-02-05 DIAGNOSIS — S82892B Other fracture of left lower leg, initial encounter for open fracture type I or II: Secondary | ICD-10-CM | POA: Diagnosis not present

## 2020-02-05 MED ORDER — COVID-19 MRNA VACC (MODERNA) 50 MCG/0.25ML IM SUSP
0.2500 mL | Freq: Once | INTRAMUSCULAR | Status: AC
  Administered 2020-02-05: 0.25 mL via INTRAMUSCULAR
  Filled 2020-02-05: qty 0.25

## 2020-02-05 NOTE — Progress Notes (Signed)
Patient refused CPAP for the night  

## 2020-02-05 NOTE — Progress Notes (Signed)
PROGRESS NOTE    ATARI NOVICK  PPJ:[id number] DOB: [birth year] DOA: 01/31/2020 PCP: Noni Saupe, MD   Brief Narrative:  HPI per Dr. Mikey College on 01/31/20  HPI: KELSIE KRAMP is a 60 y.o. male with medical history significant of paroxysmal A. fib on Eliquis, IIDM, diabetic neuropathy, morbid obesity, BPH, venous insufficiency on as needed Lasix, presented with mechanical fall and left ankle fracture.  Patient woke up at night, and tripped and fell on the left leg and ankle, he said he was a little unsteady but denied any prodromes of lightheadedness chest pain or shortness of breath.  Sustained a open fracture of left ankle and came to ED. ED Course: X-ray showed acute left malleolus fracture with mild dislocation of left ankle.  Patient shifted to the OR to have ORIF of left ankle.  In PACU, patient developed in and out uncontrolled A. fib, responded to IV Lopressor.  Blood pressure stable, elevated lactic acid 2.6, WBC WNL.  **Interim History  Patient's heart rate is improved and he is status post ORIF for his left ankle.  PT OT evaluating and recommending SNF.  Patient continues to have a lactic acidosis so we will give him more fluids. Last lactic acid level 3.2 and slightly trending up but has been fluctuating in the 2-3 range but now is finally resolved. He was given 2 L of boluses and was continued on IV fluid hydration but maintenance IVF has now stopped. He is stable to be discharged to a SNF but bed availability is the current barrier for SNF.   Assessment & Plan:   Active Problems:   Open fracture dislocation of left ankle   Ankle fracture   Permanent atrial fibrillation (HCC)   Chronic A Fib now with RVR, improved -Patient appears to be hypovolemic, will give a short course 12 hours IV fluid; Now IVF resumed yesterday and will c/w NS at 75 mL/hr for now given his lactic acidosis -Rate control wise, responded to IV metoprolol in ED and PACU, discussed with surgeon and  PACU nurse, patient appears to be stable to swallow and eat, will resume home metoprolol.  -PRN IV metoprolol 2.5 mg IV every 6 as needed for heart rate greater than 110 -Continue with metoprolol tartrate 25 mg p.o. nightly -Surgeon agreed to restart Eliquis and now back on Apixaban 5 mg po BID and he is tolerating it well  -Make K>4, and check magnesium level -Continue to monitor on telemetry; HR was still in A Fib this AM but was rate controlled in the 80's  Open fracture of left ankle -Status post ORIF -Antibiotics and postop care as per surgery team -Continue with acetaminophen 325-650 mg p.o. every 6 as needed for mild pain, hydrocodone-acetaminophen 1-2 tabs p.o. every 4 as needed moderate pain, as well as hydromorphone IV 0.5-1 mg IV every 4 as needed for severe pain next-also continue with methocarbamol 500 g p.o./IV every 6 as needed for muscle spasms; patient also has oxycodone IR as well as tramadol and will need to consolidate his pain medications but will leave this to orthopedic surgery -Bowel regimen with bisacodyl 10 mg rectally, docusate 100 mg p.o. twice daily, and a Fleet enema -C/w Apixaban 5 mg po BID for VTE prophylaxis  -Orthopedic recommending nonweightbearing and following up with Dr. Carola Frost as an outpatient -Further care per Ortho and PT OT recommending skilled nursing facility; from a medical standpoint he is stable to be discharged however the facilities that he requested  do not have bed availability so currently bed availability is still pending  Lactic Acidosis, improved -Patient's lactic acid level went from 2.6 and trended up to 3.9 is now trending down to 2.6 yesterday but then went back up to 2.8; today lactic acid level was 3.2 -Continue IV fluid hydration with normal saline at 75 MLS per hour we will hold his Metformin -He will be given two 1 L normal saline boluses yesterday  -Continue monitor and will not trend lactic acid level anymore given that his lactic  acid level today was 1.5  Leukocytosis -Likely in setting of his surgery and less likely infection given that he is afebrile and has no symptoms -WBC went from 9.2 -> 14.7 -> 13.5 -> 12.0 -> 9.7 yesterday -Continue to monitor for signs and symptoms of infection; currently no overt infection noted -Continue monitor and trend and repeat CMP in the a.m.  Renal Insufficiency rule out chronic kidney disease, improved -Patient's BUNs/creatinine went from 13/1.00 and is now 12/1.26 and is now trended back down with IV fluid hydration is now 14/0.91 on last check yesterday  -IV fluid hydration now stopped  -Avoid nephrotoxic medications, contrast dyes, hypotension and renally dose medications -Continue monitor renal function closely and repeat CMP in a.m.  OSA -CPAP HS  IIDM -Because he has a Lactic Acidosis will hold and stop Metformin for now -Blood sugars have been ranging from 127-222 on daily BMP/CMP; Yesterday AM was 130 -Checked Hemoglobin A1c this AM nd was 6.0 -If necessary will be placed on a moderate NovoLog sign scale insulin before meals and at bedtime  Hyperlipidemia -Continue Simvastatin 20 g p.o. nightly  BPH -Continue Tamsulosin 0.4 mg p.o. nightly  GERD -Continue with Pantoprazole 40 mg p.o. daily  Diabetic Neuropathy  -Resume Gabapentin 600 mg po TID  Normocytic Anemia -Patient's hemoglobin/hematocrit went from 13.6/40.0 ->12.4/37.4  ->13.2/41.7 -> 12.8/40.4 yesterday -Checked Anemia Panel and it showed of 39, U IBC level of 360, TIBC of 399, saturation ratios of 10%, ferritin level 68, folate level of 9.1, and vitamin B12 level of 1704 -Started Niferex 150 mg p.o. daily -Continue to monitor for signs and symptoms of bleeding; currently no overt bleeding noted -Repeat CBC in a.m.  Hypokalemia -Patient's K+ yesterday AM was 3.4 and was not repeated today  -Replete with po KCl 40 mEQ BID x2 Doses yesterday  -Continue to Monitor and Replete as  Necessary -Repeat CMP in the AM  Left Hip Pain and Soreness -Has a small bruise and will obtain a DG hip unilateral with pelvis on the left 2-3 views -Left Hip X-Ray showed "No acute findings. No osseous fracture or dislocation."  Obesity -Complicates overall prognosis and care -Estimated body mass index is 48.12 kg/m as calculated from the following:   Height as of this encounter: 6\' 2"  (1.88 m).   Weight as of this encounter: 170 kg. -Weight loss and Dietary Counseling given  DVT prophylaxis: Per Orthopedic Surgery  Code Status: FULL CODE  Family Communication: No family present at bedside  Disposition Plan: SNF when cleared by orthopedic surgery and bed is available; Lactic Acidosis has resolved; Anticipating D/Cing Monday 02/07/19 as [employer] are closed and will need Authorization  Status is: Inpatient  Remains inpatient appropriate because:Unsafe d/c plan, IV treatments appropriate due to intensity of illness or inability to take PO and Inpatient level of care appropriate due to severity of illness   Dispo: The patient is from: Home  Anticipated d/c is to: SNF              Anticipated d/c date is: 2 days              Patient currently is medically stable to d/c.  Consultants:   Orthopedic Surgery    Procedures:  PROCEDURES: 1.  Open reduction internal fixation of left ankle trimalleolar fracture without fixation of the posterior lip. 2.  Open treatment of left ankle dislocation with K-wire fixation of the tibiotalar joint. 3.  Open reduction internal fixation of left ankle syndesmosis. 4.  Debridement of open fracture dislocation including skin, subcutaneous tissue, fascia and bone. 5.  Stress fluoroscopy, left ankle.  Antimicrobials: Anti-infectives (From admission, onward)   Start     Dose/Rate Route Frequency Ordered Stop   02/01/20 0100  ceFAZolin (ANCEF) IVPB 2g/100 mL premix        2 g 200 mL/hr over 30 Minutes Intravenous Every 8 hours  02/01/20 0019 02/01/20 1733   01/31/20 1543  vancomycin (VANCOCIN) powder  Status:  Discontinued          As needed 01/31/20 1544 01/31/20 1638   01/31/20 0430  clindamycin (CLEOCIN) IVPB 900 mg  Status:  Discontinued        900 mg 100 mL/hr over 30 Minutes Intravenous Every 8 hours 01/31/20 0408 02/01/20 0017   01/31/20 0315  ceFAZolin (ANCEF) IVPB 2g/100 mL premix        2 g 200 mL/hr over 30 Minutes Intravenous  Once 01/31/20 0308 01/31/20 0547       Subjective: Seen and examined at bedside and he was doing fairly well.  No nausea or vomiting.  States that he slept okay.  Denies any lightheadedness or dizziness.  Asked about his Covid booster and so we have ordered him the [employer].  No other concerns or complaints at this time and he feels relatively well.  Objective: Vitals:   02/04/20 0419 02/04/20 1451 02/04/20 2100 02/05/20 0512  BP: (!) 166/93 (!) 154/86 (!) 162/80 (!) 146/85  Pulse: 77 (!) 108 87 76  Resp: 18 18 18 20   Temp: 98.1 F (36.7 C) 99.8 F (37.7 C)  98.4 F (36.9 C)  TempSrc: Oral Oral  Oral  SpO2: 92%  99% 95%  Weight:      Height:        Intake/Output Summary (Last 24 hours) at 02/05/2020 0827 Last data filed at 02/04/2020 1424 Gross per 24 hour  Intake 240 ml  Output 1400 ml  Net -1160 ml   Filed Weights   01/31/20 0255 01/31/20 0308  Weight: (!) 170 kg (!) 170 kg   Examination Physical Exam:  Constitutional: WN/WD obese Caucasian male currently in no acute distress laying in bed and appears calm and comfortable Eyes: Lids and conjunctivae normal, sclerae anicteric  ENMT: External Ears, Nose appear normal. Grossly normal hearing.  Neck: Appears normal, supple, no cervical masses, normal ROM, no appreciable thyromegaly; no JVD Respiratory: Diminished to auscultation bilaterally, no wheezing, rales, rhonchi or crackles. Normal respiratory effort and patient is not tachypenic. No accessory muscle use.  Unlabored breathing Cardiovascular:  RRR, no murmurs / rubs / gallops. S1 and S2 auscultated.  Has minimal lower extremity edema Abdomen: Soft, non-tender, distended secondary body habitus. Bowel sounds positive.  GU: Deferred. Musculoskeletal: No clubbing / cyanosis of digits/nails.  Left ankle is wrapped in an Ace bandage Skin: No rashes, lesions, ulcers on a limited skin evaluation. No induration; Warm and dry.  Neurologic: CN 2-12 grossly intact with no focal deficits. Romberg sign and cerebellar reflexes not assessed.  Psychiatric: Normal judgment and insight. Alert and oriented x 3. Normal mood and appropriate affect.    Data Reviewed: I have personally reviewed following labs and imaging studies  CBC: Recent Labs  Lab 01/31/20 0320 01/31/20 0329 02/01/20 0211 02/02/20 0233 02/03/20 0147 02/04/20 0255  WBC 9.2  --  14.7* 13.5* 12.0* 9.7  NEUTROABS  --   --   --  10.2* 7.9* 6.1  HGB 14.6 15.0 13.6 12.4* 13.2 12.8*  HCT 43.6 44.0 40.0 37.4* 41.7 40.4  MCV 92.2  --  91.3 94.2 95.0 94.4  PLT 213  --  283 252 240 [id number]   Basic Metabolic Panel: Recent Labs  Lab 01/31/20 0320 01/31/20 0329 01/31/20 1912 02/01/20 0211 02/02/20 0233 02/03/20 0147 02/04/20 0255  NA 137 138  --  137 140 138 139  K 3.8 3.8  --  4.0 4.4 3.7 3.4*  CL 100 99  --  100 102 101 100  CO2 26  --   --  25 28 26 26   GLUCOSE 165* 155*  --  222* 147* 127* 130*  BUN 11 13  --  12 15 15 14   CREATININE 1.12 1.00  --  1.26* 1.02 1.01 0.91  CALCIUM 8.9  --   --  8.9 8.9 8.5* 8.7*  MG  --   --  2.1  --  2.1 1.9 2.0  PHOS  --   --  2.9  --  3.4 3.1 3.6   GFR: Estimated Creatinine Clearance: 143.2 mL/min (by C-G formula based on SCr of 0.91 mg/dL). Liver Function Tests: Recent Labs  Lab 01/31/20 0320 02/02/20 0233 02/03/20 0147 02/04/20 0255  AST 29 18 23 22   ALT 24 16 17 21   ALKPHOS 66 54 58 72  BILITOT 0.7 0.3 0.5 0.8  PROT 7.2 7.2 7.0 7.4  ALBUMIN 3.4* 3.3* 3.3* 3.2*   No results for input(s): LIPASE, AMYLASE in the last 168  hours. No results for input(s): AMMONIA in the last 168 hours. Coagulation Profile: Recent Labs  Lab 01/31/20 0320  INR 1.1   Cardiac Enzymes: No results for input(s): CKTOTAL, CKMB, CKMBINDEX, TROPONINI in the last 168 hours. BNP (last 3 results) No results for input(s): PROBNP in the last 8760 hours. HbA1C: Recent Labs    02/04/20 0255  HGBA1C 6.0*   CBG: Recent Labs  Lab 01/31/20 1147 01/31/20 1643  GLUCAP 121* 173*   Lipid Profile: No results for input(s): CHOL, HDL, LDLCALC, TRIG, CHOLHDL, LDLDIRECT in the last 72 hours. Thyroid Function Tests: No results for input(s): TSH, T4TOTAL, FREET4, T3FREE, THYROIDAB in the last 72 hours. Anemia Panel: Recent Labs    02/04/20 0255  VITAMINB12 1,704*  FOLATE 9.1  FERRITIN 68  TIBC 399  IRON 39*  RETICCTPCT 2.8   Sepsis Labs: Recent Labs  Lab 02/01/20 1250 02/02/20 0855 02/02/20 1344 02/03/20 0147  LATICACIDVEN 2.8* 3.2* 3.3* 1.5    Recent Results (from the past 240 hour(s))  Resp Panel by RT-PCR (Flu A&B, Covid) Nasopharyngeal Swab     Status: None   Collection Time: 01/31/20  3:15 AM   Specimen: Nasopharyngeal Swab; Nasopharyngeal(NP) swabs in vial transport medium  Result Value Ref Range Status   SARS Coronavirus 2 by RT PCR NEGATIVE NEGATIVE Final    Comment: (NOTE) SARS-CoV-2 target nucleic acids are NOT DETECTED.  The SARS-CoV-2 RNA is generally detectable in upper respiratory specimens during  the acute phase of infection. The lowest concentration of SARS-CoV-2 viral copies this assay can detect is 138 copies/mL. A negative result does not preclude SARS-Cov-2 infection and should not be used as the sole basis for treatment or other patient management decisions. A negative result may occur with  improper specimen collection/handling, submission of specimen other than nasopharyngeal swab, presence of viral mutation(s) within the areas targeted by this assay, and inadequate number of viral copies(<138  copies/mL). A negative result must be combined with clinical observations, patient history, and epidemiological information. The expected result is Negative.  Fact Sheet for Patients:  EntrepreneurPulse.com.au  Fact Sheet for Healthcare Providers:  IncredibleEmployment.be  This test is no t yet approved or cleared by the [place] FDA and  has been authorized for detection and/or diagnosis of SARS-CoV-2 by FDA under an Emergency Use Authorization (EUA). This EUA will remain  in effect (meaning this test can be used) for the duration of the COVID-19 declaration under Section 564(b)(1) of the Act, 21 U.S.C.section 360bbb-3(b)(1), unless the authorization is terminated  or revoked sooner.       Influenza A by PCR NEGATIVE NEGATIVE Final   Influenza B by PCR NEGATIVE NEGATIVE Final    Comment: (NOTE) The Xpert Xpress SARS-CoV-2/FLU/RSV plus assay is intended as an aid in the diagnosis of influenza from Nasopharyngeal swab specimens and should not be used as a sole basis for treatment. Nasal washings and aspirates are unacceptable for Xpert Xpress SARS-CoV-2/FLU/RSV testing.  Fact Sheet for Patients: EntrepreneurPulse.com.au  Fact Sheet for Healthcare Providers: IncredibleEmployment.be  This test is not yet approved or cleared by the [place] FDA and has been authorized for detection and/or diagnosis of SARS-CoV-2 by FDA under an Emergency Use Authorization (EUA). This EUA will remain in effect (meaning this test can be used) for the duration of the COVID-19 declaration under Section 564(b)(1) of the Act, 21 U.S.C. section 360bbb-3(b)(1), unless the authorization is terminated or revoked.  Performed at [facility], [place] [address] [place], [place] [address]   Surgical pcr screen     Status: None   Collection Time: 01/31/20 11:40 AM   Specimen: Nasal Mucosa; Nasal Swab  Result Value Ref  Range Status   MRSA, PCR NEGATIVE NEGATIVE Final   Staphylococcus aureus NEGATIVE NEGATIVE Final    Comment: (NOTE) The Xpert SA Assay (FDA approved for NASAL specimens in patients 9 years of age and older), is one component of a comprehensive surveillance program. It is not intended to diagnose infection nor to guide or monitor treatment. Performed at [facility], [place] [address] [place], [place] [address]   Resp Panel by RT-PCR (Flu A&B, Covid) Nasopharyngeal Swab     Status: None   Collection Time: 02/03/20  1:33 PM   Specimen: Nasopharyngeal Swab; Nasopharyngeal(NP) swabs in vial transport medium  Result Value Ref Range Status   SARS Coronavirus 2 by RT PCR NEGATIVE NEGATIVE Final    Comment: (NOTE) SARS-CoV-2 target nucleic acids are NOT DETECTED.  The SARS-CoV-2 RNA is generally detectable in upper respiratory specimens during the acute phase of infection. The lowest concentration of SARS-CoV-2 viral copies this assay can detect is 138 copies/mL. A negative result does not preclude SARS-Cov-2 infection and should not be used as the sole basis for treatment or other patient management decisions. A negative result may occur with  improper specimen collection/handling, submission of specimen other than nasopharyngeal swab, presence of viral mutation(s) within the areas targeted by this assay, and  inadequate number of viral copies(<138 copies/mL). A negative result must be combined with clinical observations, patient history, and epidemiological information. The expected result is Negative.  Fact Sheet for Patients:  EntrepreneurPulse.com.au  Fact Sheet for Healthcare Providers:  IncredibleEmployment.be  This test is no t yet approved or cleared by the [place] FDA and  has been authorized for detection and/or diagnosis of SARS-CoV-2 by FDA under an Emergency Use Authorization (EUA). This EUA will remain  in effect (meaning  this test can be used) for the duration of the COVID-19 declaration under Section 564(b)(1) of the Act, 21 U.S.C.section 360bbb-3(b)(1), unless the authorization is terminated  or revoked sooner.       Influenza A by PCR NEGATIVE NEGATIVE Final   Influenza B by PCR NEGATIVE NEGATIVE Final    Comment: (NOTE) The Xpert Xpress SARS-CoV-2/FLU/RSV plus assay is intended as an aid in the diagnosis of influenza from Nasopharyngeal swab specimens and should not be used as a sole basis for treatment. Nasal washings and aspirates are unacceptable for Xpert Xpress SARS-CoV-2/FLU/RSV testing.  Fact Sheet for Patients: EntrepreneurPulse.com.au  Fact Sheet for Healthcare Providers: IncredibleEmployment.be  This test is not yet approved or cleared by the [place] FDA and has been authorized for detection and/or diagnosis of SARS-CoV-2 by FDA under an Emergency Use Authorization (EUA). This EUA will remain in effect (meaning this test can be used) for the duration of the COVID-19 declaration under Section 564(b)(1) of the Act, 21 U.S.C. section 360bbb-3(b)(1), unless the authorization is terminated or revoked.  Performed at [facility], [place] [address] [place], [place] [address]     RN Pressure Injury Documentation:     Estimated body mass index is 48.12 kg/m as calculated from the following:   Height as of this encounter: 6\' 2"  (1.88 m).   Weight as of this encounter: 170 kg.  Malnutrition Type:    Malnutrition Characteristics:   Nutrition Interventions:     Radiology Studies: DG HIP UNILAT WITH PELVIS 2-3 VIEWS LEFT  Result Date: 02/04/2020 CLINICAL DATA:  Fall 5 days ago, LEFT hip pain with bruising. EXAM: DG HIP (WITH OR WITHOUT PELVIS) 2-3V LEFT COMPARISON:  None. FINDINGS: Single-view of the pelvis and two views of the LEFT hip are provided. Osseous alignment is normal. No fracture line or displaced fracture fragment is seen.  Degenerative spurring at the lateral margin of the LEFT acetabulum. Soft tissues about the pelvis and LEFT hip are unremarkable. IMPRESSION: No acute findings. No osseous fracture or dislocation. Electronically Signed   By: Franki Cabot M.D.   On: 02/04/2020 13:20   Scheduled Meds: . apixaban  5 mg Oral BID  . dicyclomine  20 mg Oral BID  . docusate sodium  100 mg Oral BID  . DULoxetine  60 mg Oral Daily  . gabapentin  600 mg Oral TID  . iron polysaccharides  150 mg Oral Daily  . metoprolol tartrate  25 mg Oral QHS  . metoprolol tartrate  50 mg Oral Daily  . pantoprazole  40 mg Oral Daily  . simvastatin  20 mg Oral QHS  . tamsulosin  0.4 mg Oral QHS  . traMADol  50 mg Oral Q6H  . vitamin B-12  2,500 mcg Oral Daily   Continuous Infusions: . methocarbamol (ROBAXIN) IV      LOS: 5 days   Kerney Elbe, DO [facility] PAGER is on AMION  If 7PM-7AM, please contact night-coverage [email or web address]

## 2020-02-06 DIAGNOSIS — R7303 Prediabetes: Secondary | ICD-10-CM | POA: Diagnosis not present

## 2020-02-06 DIAGNOSIS — S82892B Other fracture of left lower leg, initial encounter for open fracture type I or II: Secondary | ICD-10-CM | POA: Diagnosis not present

## 2020-02-06 DIAGNOSIS — I4821 Permanent atrial fibrillation: Secondary | ICD-10-CM | POA: Diagnosis not present

## 2020-02-06 NOTE — Op Note (Unsigned)
NAME: David Fitzgerald, David Fitzgerald MEDICAL RECORD NW:[id number] ACCOUNT [id number] DATE OF BIRTH:[birth year] FACILITY: [facility] LOCATION: [id number] PHYSICIAN:Arav Bannister H. Diahann Guajardo, MD  OPERATIVE REPORT  DATE OF PROCEDURE:  01/31/2020  PREOPERATIVE DIAGNOSIS:  Type 2 open left ankle trimalleolar fracture dislocation.  POSTOPERATIVE DIAGNOSES: 1.  Type 2 open left ankle trimalleolar fracture dislocation. 2.  Ruptured left ankle syndesmosis.  PROCEDURES: 1.  ORIF of open trimalleolar fracture dislocation without fixation of the posterior lip. 2.  ORIF of left ankle syndesmosis. 3.  Open treatment of left ankle dislocation with tibiotalar calcaneal pinning. 4.  Debridement of open fracture including bone, left trimalleolar fracture. 5.  Retention suture closure middle 6 cm traumatic wound. 6.  Application of stress under fluoroscopy, left ankle.  SURGEON:  Myrene Galas, MD  ASSISTANT:  None.  ANESTHESIA:  General.  COMPLICATIONS:  None.  TOURNIQUET:  None.  DISPOSITION:  To PACU.  CONDITION:  Stable.  INDICATIONS FOR PROCEDURE:  The patient is a 60 year old male with a history of neuropathy following back surgery, diabetes.  He has been on disability for over a decade.  The patient has neuropathy that prevents sensation in his lower extremities.  He  fell and heard a crack, but continued to walk until he heard another crack which then resulted in deformity and bleeding of his ankle.  The skin was under pressure but was reduced in the emergency department.  During application of the splint or perhaps  prior to this, the patient developed a significant subluxation or essentially dislocation, but without pressure on the skin.  Consequently, decision was made to proceed urgently to the OR for irrigation, debridement and probable external fixation.  I did  discuss with the patient the risks and benefits of surgical treatment including the possibility of failure to prevent infection, arthritis, loss of  motion, deep infection which could result in limb loss and many others.  He acknowledged these risks and  strongly wished to proceed.  BRIEF SUMMARY OF PROCEDURE:  The patient was taken to the operating room where general anesthesia was induced.  His splint was removed and leg cleansed with chlorhexidine wash, Betadine scrub and paint.  The skin on the medial side where his traumatic  wound of nearly 5 cm was present was extended proximally and distally at the corners in order to improve visualization.  The lateral side did not seem excessively swollen.  The ankle was grossly unstable.  There were small bone chips visible medially.   Chlorhexidine wash was used to supplement 9 liters of saline taken through the open wound and thoroughly cleaning all of the surfaces.  Knife was used to debride devitalized skin, subcutaneous tissue and fascia and again the bone chips as mentioned  above, some of these back in the retinaculum.  The retinaculum was completely torn and again the ankle was rather grossly unstable.  Following this, fresh attire and drapes were applied.  Attention was then turned to the lateral side where a longitudinal  incision was made to approach the lateral malleolus.  The fracture site was cleaned out, reduced and held compressed with a clamp for compression followed by a partially threaded screw.  This was followed by application of a long neutralization plate.   The plate fit properly only if allowed to rest anteriorly somewhat and consequently this was permitted.  After application, the AP, lateral and mortise views showed excellent reduction of the lateral malleolus; however, there was syndesmotic instability  noted when application of stress under fluoroscopy was applied to  the ankle.  Consequently, a reduction maneuver was performed and held with a tenaculum through the medial wound and the head of the screw laterally.  Two syndesmotic screws were then  placed.  I attempted to place  them at a anterior angle, but was not able to do so because of where the plate was on the fibula and consequently I just accepted their position, given that the reduction was near anatomic of the syndesmosis.  Following  this, once more, attention was turned to the ankle dislocation.  Here, there still remains instability because of the extensive tear of his medial retinaculum and other structures.  Consequently stabilization was indicated.  This was achieved with two  2.4 mm guide pins placed through the calcaneus across the talus and into the tibia with the ankle in neutral flexion.  This was to prevent any sort of abduction of the ankle as well, which could put additional strain on his traumatic wound.  Lastly, a  layered closure was performed there with 2-0 PDS and retention sutures in the middle of the wound 6 cm using a far-near-near-far sutures and then where the proximal and distal limbs were extended, standard 2-0 nylon.  There were no complications during the  procedure.  Sterile gently compressive dressing and posterior and stirrup splint were applied.  The patient was taken to PACU in stable condition.  PROGNOSIS:  The patient is at elevated risk of complications including infection, nonunion and arthritis.  He can resume his Eliquis immediately.  Of course, we will maintain nonweightbearing.  We will likely need transition into a cast to protect his  soft tissues, but if sufficient stability, could be considered for transition into a CAM once the pins can be removed.  HN/NUANCE  D:02/06/2020 T:02/06/2020 JOB:[id number]

## 2020-02-06 NOTE — Plan of Care (Signed)
?  Problem: Education: ?Goal: Knowledge of General Education information will improve ?Description: Including pain rating scale, medication(s)/side effects and non-pharmacologic comfort measures ?Outcome: Progressing ?  ?Problem: Clinical Measurements: ?Goal: Respiratory complications will improve ?Outcome: Progressing ?  ?Problem: Activity: ?Goal: Risk for activity intolerance will decrease ?Outcome: Progressing ?  ?Problem: Nutrition: ?Goal: Adequate nutrition will be maintained ?Outcome: Progressing ?  ?Problem: Coping: ?Goal: Level of anxiety will decrease ?Outcome: Progressing ?  ?Problem: Pain Managment: ?Goal: General experience of comfort will improve ?Outcome: Progressing ?  ?

## 2020-02-06 NOTE — Progress Notes (Signed)
PROGRESS NOTE    David Fitzgerald  EHM:[id number] DOB: 1960/07/22 DOA: 01/31/2020 PCP: Noni Saupe, MD   Brief Narrative:  HPI per Dr. Mikey College on 01/31/20  HPI: David Fitzgerald is a 60 y.o. male with medical history significant of paroxysmal A. fib on Eliquis, IIDM, diabetic neuropathy, morbid obesity, BPH, venous insufficiency on as needed Lasix, presented with mechanical fall and left ankle fracture.  Patient woke up at night, and tripped and fell on the left leg and ankle, he said he was a little unsteady but denied any prodromes of lightheadedness chest pain or shortness of breath.  Sustained a open fracture of left ankle and came to ED. ED Course: X-ray showed acute left malleolus fracture with mild dislocation of left ankle.  Patient shifted to the OR to have ORIF of left ankle.  In PACU, patient developed in and out uncontrolled A. fib, responded to IV Lopressor.  Blood pressure stable, elevated lactic acid 2.6, WBC WNL.  **Interim History  Patient's heart rate is improved and he is status post ORIF for his left ankle.  PT OT evaluating and recommending SNF.  Patient continues to have a lactic acidosis so we will give him more fluids. Last lactic acid level 3.2 and slightly trending up but has been fluctuating in the 2-3 range but now is finally resolved. He was given 2 L of boluses and was continued on IV fluid hydration but maintenance IVF has now stopped. He is stable to be discharged to a SNF but bed availability is the current barrier for SNF.   Assessment & Plan:   Active Problems:   Open fracture dislocation of left ankle   Ankle fracture   Permanent atrial fibrillation (HCC)   Chronic A Fib now with RVR, improved -Patient appears to be hypovolemic, will give a short course 12 hours IV fluid; Now IVF resumed yesterday and will c/w NS at 75 mL/hr for now given his lactic acidosis -Rate control wise, responded to IV metoprolol in ED and PACU, discussed with surgeon and  PACU nurse, patient appears to be stable to swallow and eat, will resume home metoprolol.  -PRN IV metoprolol 2.5 mg IV every 6 as needed for heart rate greater than 110 -Continue with metoprolol tartrate 25 mg p.o. nightly -Surgeon agreed to restart Eliquis and now back on Apixaban 5 mg po BID and he is tolerating it well  -Make K>4, and check magnesium level -Continue to monitor on telemetry; HR was still in A Fib this AM but was rate controlled in the 80's  Open fracture of left ankle -Status post ORIF -Antibiotics and postop care as per surgery team -Continue with acetaminophen 325-650 mg p.o. every 6 as needed for mild pain, hydrocodone-acetaminophen 1-2 tabs p.o. every 4 as needed moderate pain, as well as hydromorphone IV 0.5-1 mg IV every 4 as needed for severe pain next-also continue with methocarbamol 500 g p.o./IV every 6 as needed for muscle spasms; patient also has oxycodone IR as well as tramadol and will need to consolidate his pain medications but will leave this to orthopedic surgery -Bowel regimen with bisacodyl 10 mg rectally, docusate 100 mg p.o. twice daily, and a Fleet enema -C/w Apixaban 5 mg po BID for VTE prophylaxis  -Orthopedic recommending nonweightbearing and following up with Dr. Carola Frost as an outpatient -Further care per Ortho and PT OT recommending skilled nursing facility; from a medical standpoint he is stable to be discharged however the facilities that he requested  do not have bed availability so currently bed availability is still pending; he was offered a bed at [facility] but wants to look at other options as he had a family member that had a very bad experience of [facility]  Lactic Acidosis, improved -Patient's lactic acid level went from 2.6 and trended up to 3.9 is now trending down to 2.6 yesterday but then went back up to 2.8; today lactic acid level was 3.2 -Continue IV fluid hydration with normal saline at 75 MLS per hour we will hold his  Metformin -He will be given two 1 L normal saline boluses yesterday  -Continue monitor and will not trend lactic acid level anymore given that his lactic acid level today was 1.5  Leukocytosis -Likely in setting of his surgery and less likely infection given that he is afebrile and has no symptoms -WBC went from 9.2 -> 14.7 -> 13.5 -> 12.0 -> 9.7 the day before yesterday -Continue to monitor for signs and symptoms of infection; currently no overt infection noted -Continue monitor and trend and repeat CMP in the a.m.  Renal Insufficiency rule out chronic kidney disease, improved -Patient's BUNs/creatinine went from 13/1.00 and is now 12/1.26 and is now trended back down with IV fluid hydration is now 14/0.91 on last check the day before yesterday  -IV fluid hydration now stopped  -Avoid nephrotoxic medications, contrast dyes, hypotension and renally dose medications -Continue monitor renal function closely and repeat CMP in a.m.  OSA -CPAP HS  IIDM -Because he has a Lactic Acidosis will hold and stop Metformin for now -Blood sugars have been ranging from 127-222 on daily BMP/CMP; The day before Yesterday AM was 130 -Checked Hemoglobin A1c this AM nd was 6.0 -If necessary will be placed on a moderate NovoLog sign scale insulin before meals and at bedtime  Hyperlipidemia -Continue Simvastatin 20 g p.o. nightly  BPH -Continue Tamsulosin 0.4 mg p.o. nightly  GERD -Continue with Pantoprazole 40 mg p.o. daily  Diabetic Neuropathy  -Resume Gabapentin 600 mg po TID  Normocytic Anemia -Patient's hemoglobin/hematocrit went from 13.6/40.0 ->12.4/37.4  ->13.2/41.7 -> 12.8/40.4 the day before yesterday -Checked Anemia Panel and it showed of 39, U IBC level of 360, TIBC of 399, saturation ratios of 10%, ferritin level 68, folate level of 9.1, and vitamin B12 level of 1704 -Started Niferex 150 mg p.o. daily -Continue to monitor for signs and symptoms of bleeding; currently no overt  bleeding noted -Repeat CBC in a.m.  Hypokalemia -Patient's K+ was 3.4 on last check  -Replete with po KCl 40 mEQ BID x2 Doses the day before yesterday  -Continue to Monitor and Replete as Necessary -Repeat CMP in the AM  Left Hip Pain and Soreness -Has a small bruise and will obtain a DG hip unilateral with pelvis on the left 2-3 views -Left Hip X-Ray showed "No acute findings. No osseous fracture or dislocation."  Obesity -Complicates overall prognosis and care -Estimated body mass index is 48.12 kg/m as calculated from the following:   Height as of this encounter: 6\' 2"  (1.88 m).   Weight as of this encounter: 170 kg. -Weight loss and Dietary Counseling given  DVT prophylaxis: Per Orthopedic Surgery  Code Status: FULL CODE  Family Communication: No family present at bedside  Disposition Plan: SNF when cleared by orthopedic surgery and bed is available; Lactic Acidosis has resolved; Anticipating D/Cing Monday 02/07/19 as [facility] [facility] are closed and will need Authorization; will need SNF option and bed placement as well as insurance authorization  Status is: Inpatient  Remains inpatient appropriate because:Unsafe d/c plan, IV treatments appropriate due to intensity of illness or inability to take PO and Inpatient level of care appropriate due to severity of illness   Dispo: The patient is from: Home              Anticipated d/c is to: SNF              Anticipated d/c date is: 2 days              Patient currently is medically stable to d/c.  Consultants:   Orthopedic Surgery    Procedures:  PROCEDURES: 1.  Open reduction internal fixation of left ankle trimalleolar fracture without fixation of the posterior lip. 2.  Open treatment of left ankle dislocation with K-wire fixation of the tibiotalar joint. 3.  Open reduction internal fixation of left ankle syndesmosis. 4.  Debridement of open fracture dislocation including skin, subcutaneous tissue, fascia and bone. 5.   Stress fluoroscopy, left ankle.  Antimicrobials: Anti-infectives (From admission, onward)   Start     Dose/Rate Route Frequency Ordered Stop   02/01/20 0100  ceFAZolin (ANCEF) IVPB 2g/100 mL premix        2 g 200 mL/hr over 30 Minutes Intravenous Every 8 hours 02/01/20 0019 02/01/20 1733   01/31/20 1543  vancomycin (VANCOCIN) powder  Status:  Discontinued          As needed 01/31/20 1544 01/31/20 1638   01/31/20 0430  clindamycin (CLEOCIN) IVPB 900 mg  Status:  Discontinued        900 mg 100 mL/hr over 30 Minutes Intravenous Every 8 hours 01/31/20 0408 02/01/20 0017   01/31/20 0315  ceFAZolin (ANCEF) IVPB 2g/100 mL premix        2 g 200 mL/hr over 30 Minutes Intravenous  Once 01/31/20 0308 01/31/20 0547       Subjective: Seen and examined at bedside and felt well and states that he does not want to go to the SNF that was offered for him and wants to see if he can be faxed out to other options and closer to hospital such as [place].  He denies any complaints and wrist resting.  No nausea or vomiting.  Tolerated his vaccine yesterday.  Feels okay and denies any other concerns or complaints at this time.  Objective: Vitals:   02/05/20 1336 02/05/20 1825 02/05/20 2028 02/06/20 0748  BP: (!) 155/88 (!) 159/87 (!) 147/73 (!) 131/95  Pulse: (!) 101 (!) 102 63 85  Resp: 18 17 18 20   Temp: (!) 97.4 F (36.3 C)  98.2 F (36.8 C) 97.9 F (36.6 C)  TempSrc: Oral  Oral Oral  SpO2: 96%  93% 94%  Weight:      Height:        Intake/Output Summary (Last 24 hours) at 02/06/2020 0835 Last data filed at 02/06/2020 0749 Gross per 24 hour  Intake 600 ml  Output 2050 ml  Net -1450 ml   Filed Weights   01/31/20 0255 01/31/20 0308  Weight: (!) 170 kg (!) 170 kg   Examination: Physical Exam:  Constitutional: WN/WD obese Caucasian male currently in no acute distress appears calm and comfortable Respiratory: Mildly diminished to auscultation bilaterally, no wheezing, rales, rhonchi or  crackles. Normal respiratory effort and patient is not tachypenic. No accessory muscle use.  Unlabored breathing Cardiovascular: Irregularly irregular but rate controlled, no murmurs / rubs / gallops. S1 and S2 auscultated.  Minimal extremity edema Abdomen:  Soft, non-tender, distended secondary by habitus.  Bowel sounds positive.  Neurologic: CN 2-12 grossly intact with no focal deficits. Psychiatric: Normal judgment and insight. Alert and oriented x 3. Normal mood and appropriate affect.   Data Reviewed: I have personally reviewed following labs and imaging studies  CBC: Recent Labs  Lab 01/31/20 0320 01/31/20 0329 02/01/20 0211 02/02/20 0233 02/03/20 0147 02/04/20 0255  WBC 9.2  --  14.7* 13.5* 12.0* 9.7  NEUTROABS  --   --   --  10.2* 7.9* 6.1  HGB 14.6 15.0 13.6 12.4* 13.2 12.8*  HCT 43.6 44.0 40.0 37.4* 41.7 40.4  MCV 92.2  --  91.3 94.2 95.0 94.4  PLT 213  --  283 252 240 [id number]   Basic Metabolic Panel: Recent Labs  Lab 01/31/20 0320 01/31/20 0329 01/31/20 1912 02/01/20 0211 02/02/20 0233 02/03/20 0147 02/04/20 0255  NA 137 138  --  137 140 138 139  K 3.8 3.8  --  4.0 4.4 3.7 3.4*  CL 100 99  --  100 102 101 100  CO2 26  --   --  25 28 26 26   GLUCOSE 165* 155*  --  222* 147* 127* 130*  BUN 11 13  --  12 15 15 14   CREATININE 1.12 1.00  --  1.26* 1.02 1.01 0.91  CALCIUM 8.9  --   --  8.9 8.9 8.5* 8.7*  MG  --   --  2.1  --  2.1 1.9 2.0  PHOS  --   --  2.9  --  3.4 3.1 3.6   GFR: Estimated Creatinine Clearance: 143.2 mL/min (by C-G formula based on SCr of 0.91 mg/dL). Liver Function Tests: Recent Labs  Lab 01/31/20 0320 02/02/20 0233 02/03/20 0147 02/04/20 0255  AST 29 18 23 22   ALT 24 16 17 21   ALKPHOS 66 54 58 72  BILITOT 0.7 0.3 0.5 0.8  PROT 7.2 7.2 7.0 7.4  ALBUMIN 3.4* 3.3* 3.3* 3.2*   No results for input(s): LIPASE, AMYLASE in the last 168 hours. No results for input(s): AMMONIA in the last 168 hours. Coagulation Profile: Recent Labs  Lab  01/31/20 0320  INR 1.1   Cardiac Enzymes: No results for input(s): CKTOTAL, CKMB, CKMBINDEX, TROPONINI in the last 168 hours. BNP (last 3 results) No results for input(s): PROBNP in the last 8760 hours. HbA1C: Recent Labs    02/04/20 0255  HGBA1C 6.0*   CBG: Recent Labs  Lab 01/31/20 1147 01/31/20 1643  GLUCAP 121* 173*   Lipid Profile: No results for input(s): CHOL, HDL, LDLCALC, TRIG, CHOLHDL, LDLDIRECT in the last 72 hours. Thyroid Function Tests: No results for input(s): TSH, T4TOTAL, FREET4, T3FREE, THYROIDAB in the last 72 hours. Anemia Panel: Recent Labs    02/04/20 0255  VITAMINB12 1,704*  FOLATE 9.1  FERRITIN 68  TIBC 399  IRON 39*  RETICCTPCT 2.8   Sepsis Labs: Recent Labs  Lab 02/01/20 1250 02/02/20 0855 02/02/20 1344 02/03/20 0147  LATICACIDVEN 2.8* 3.2* 3.3* 1.5    Recent Results (from the past 240 hour(s))  Resp Panel by RT-PCR (Flu A&B, Covid) Nasopharyngeal Swab     Status: None   Collection Time: 01/31/20  3:15 AM   Specimen: Nasopharyngeal Swab; Nasopharyngeal(NP) swabs in vial transport medium  Result Value Ref Range Status   SARS Coronavirus 2 by RT PCR NEGATIVE NEGATIVE Final    Comment: (NOTE) SARS-CoV-2 target nucleic acids are NOT DETECTED.  The SARS-CoV-2 RNA is generally detectable in upper respiratory  specimens during the acute phase of infection. The lowest concentration of SARS-CoV-2 viral copies this assay can detect is 138 copies/mL. A negative result does not preclude SARS-Cov-2 infection and should not be used as the sole basis for treatment or other patient management decisions. A negative result may occur with  improper specimen collection/handling, submission of specimen other than nasopharyngeal swab, presence of viral mutation(s) within the areas targeted by this assay, and inadequate number of viral copies(<138 copies/mL). A negative result must be combined with clinical observations, patient history, and  epidemiological information. The expected result is Negative.  Fact Sheet for Patients:  BloggerCourse.com  Fact Sheet for Healthcare Providers:  SeriousBroker.it  This test is no t yet approved or cleared by the [place] FDA and  has been authorized for detection and/or diagnosis of SARS-CoV-2 by FDA under an Emergency Use Authorization (EUA). This EUA will remain  in effect (meaning this test can be used) for the duration of the COVID-19 declaration under Section 564(b)(1) of the Act, 21 U.S.C.section 360bbb-3(b)(1), unless the authorization is terminated  or revoked sooner.       Influenza A by PCR NEGATIVE NEGATIVE Final   Influenza B by PCR NEGATIVE NEGATIVE Final    Comment: (NOTE) The Xpert Xpress SARS-CoV-2/FLU/RSV plus assay is intended as an aid in the diagnosis of influenza from Nasopharyngeal swab specimens and should not be used as a sole basis for treatment. Nasal washings and aspirates are unacceptable for Xpert Xpress SARS-CoV-2/FLU/RSV testing.  Fact Sheet for Patients: BloggerCourse.com  Fact Sheet for Healthcare Providers: SeriousBroker.it  This test is not yet approved or cleared by the [place] FDA and has been authorized for detection and/or diagnosis of SARS-CoV-2 by FDA under an Emergency Use Authorization (EUA). This EUA will remain in effect (meaning this test can be used) for the duration of the COVID-19 declaration under Section 564(b)(1) of the Act, 21 U.S.C. section 360bbb-3(b)(1), unless the authorization is terminated or revoked.  Performed at [facility], [address], [place], [place] [address]   Surgical pcr screen     Status: None   Collection Time: 01/31/20 11:40 AM   Specimen: Nasal Mucosa; Nasal Swab  Result Value Ref Range Status   MRSA, PCR NEGATIVE NEGATIVE Final   Staphylococcus aureus NEGATIVE NEGATIVE Final     Comment: (NOTE) The Xpert SA Assay (FDA approved for NASAL specimens in patients 40 years of age and older), is one component of a comprehensive surveillance program. It is not intended to diagnose infection nor to guide or monitor treatment. Performed at [facility], [address] [place], [place] [address]   Resp Panel by RT-PCR (Flu A&B, Covid) Nasopharyngeal Swab     Status: None   Collection Time: 02/03/20  1:33 PM   Specimen: Nasopharyngeal Swab; Nasopharyngeal(NP) swabs in vial transport medium  Result Value Ref Range Status   SARS Coronavirus 2 by RT PCR NEGATIVE NEGATIVE Final    Comment: (NOTE) SARS-CoV-2 target nucleic acids are NOT DETECTED.  The SARS-CoV-2 RNA is generally detectable in upper respiratory specimens during the acute phase of infection. The lowest concentration of SARS-CoV-2 viral copies this assay can detect is 138 copies/mL. A negative result does not preclude SARS-Cov-2 infection and should not be used as the sole basis for treatment or other patient management decisions. A negative result may occur with  improper specimen collection/handling, submission of specimen other than nasopharyngeal swab, presence of viral mutation(s) within the areas targeted by this  assay, and inadequate number of viral copies(<138 copies/mL). A negative result must be combined with clinical observations, patient history, and epidemiological information. The expected result is Negative.  Fact Sheet for Patients:  BloggerCourse.com  Fact Sheet for Healthcare Providers:  SeriousBroker.it  This test is no t yet approved or cleared by the [place] FDA and  has been authorized for detection and/or diagnosis of SARS-CoV-2 by FDA under an Emergency Use Authorization (EUA). This EUA will remain  in effect (meaning this test can be used) for the duration of the COVID-19 declaration under Section 564(b)(1) of the  Act, 21 U.S.C.section 360bbb-3(b)(1), unless the authorization is terminated  or revoked sooner.       Influenza A by PCR NEGATIVE NEGATIVE Final   Influenza B by PCR NEGATIVE NEGATIVE Final    Comment: (NOTE) The Xpert Xpress SARS-CoV-2/FLU/RSV plus assay is intended as an aid in the diagnosis of influenza from Nasopharyngeal swab specimens and should not be used as a sole basis for treatment. Nasal washings and aspirates are unacceptable for Xpert Xpress SARS-CoV-2/FLU/RSV testing.  Fact Sheet for Patients: BloggerCourse.com  Fact Sheet for Healthcare Providers: SeriousBroker.it  This test is not yet approved or cleared by the [place] FDA and has been authorized for detection and/or diagnosis of SARS-CoV-2 by FDA under an Emergency Use Authorization (EUA). This EUA will remain in effect (meaning this test can be used) for the duration of the COVID-19 declaration under Section 564(b)(1) of the Act, 21 U.S.C. section 360bbb-3(b)(1), unless the authorization is terminated or revoked.  Performed at [facility], [address], [place], [place] [address]     RN Pressure Injury Documentation:     Estimated body mass index is 48.12 kg/m as calculated from the following:   Height as of this encounter: 6\' 2"  (1.88 m).   Weight as of this encounter: 170 kg.  Malnutrition Type:   Malnutrition Characteristics:   Nutrition Interventions:      Radiology Studies: DG HIP UNILAT WITH PELVIS 2-3 VIEWS LEFT  Result Date: 02/04/2020 CLINICAL DATA:  Fall 5 days ago, LEFT hip pain with bruising. EXAM: DG HIP (WITH OR WITHOUT PELVIS) 2-3V LEFT COMPARISON:  None. FINDINGS: Single-view of the pelvis and two views of the LEFT hip are provided. Osseous alignment is normal. No fracture line or displaced fracture fragment is seen. Degenerative spurring at the lateral margin of the LEFT acetabulum. Soft tissues about the pelvis and  LEFT hip are unremarkable. IMPRESSION: No acute findings. No osseous fracture or dislocation. Electronically Signed   By: 02/06/2020 M.D.   On: 02/04/2020 13:20   Scheduled Meds: . apixaban  5 mg Oral BID  . dicyclomine  20 mg Oral BID  . docusate sodium  100 mg Oral BID  . DULoxetine  60 mg Oral Daily  . gabapentin  600 mg Oral TID  . iron polysaccharides  150 mg Oral Daily  . metoprolol tartrate  25 mg Oral QHS  . metoprolol tartrate  50 mg Oral Daily  . pantoprazole  40 mg Oral Daily  . simvastatin  20 mg Oral QHS  . tamsulosin  0.4 mg Oral QHS  . traMADol  50 mg Oral Q6H  . vitamin B-12  2,500 mcg Oral Daily   Continuous Infusions: . methocarbamol (ROBAXIN) IV      LOS: 6 days   02/06/2020, DO [facility] PAGER is on AMION  If 7PM-7AM, please contact night-coverage [email or web address]

## 2020-02-06 NOTE — Brief Op Note (Signed)
  [id number] 

## 2020-02-06 NOTE — Plan of Care (Signed)

## 2020-02-06 NOTE — Progress Notes (Signed)
Patient declined CPAP use at this time.

## 2020-02-07 DIAGNOSIS — I4821 Permanent atrial fibrillation: Secondary | ICD-10-CM | POA: Diagnosis not present

## 2020-02-07 DIAGNOSIS — R7303 Prediabetes: Secondary | ICD-10-CM | POA: Diagnosis not present

## 2020-02-07 DIAGNOSIS — S82892B Other fracture of left lower leg, initial encounter for open fracture type I or II: Secondary | ICD-10-CM | POA: Diagnosis not present

## 2020-02-07 NOTE — Care Management Important Message (Signed)
Important Message  Patient Details  Name: David Fitzgerald MRN: [id number] Date of Birth: 1960/12/15   Medicare Important Message Given:  Yes     David Fitzgerald 02/07/2020, 3:00 PM

## 2020-02-07 NOTE — Progress Notes (Signed)
Physical Therapy Treatment Patient Details Name: David Fitzgerald MRN: [id number] DOB: Dec 20, 1960 Today's Date: 02/07/2020    History of Present Illness David Fitzgerald is a 60 y.o. male with medical history significant of paroxysmal A. fib on Eliquis, IIDM, diabetic neuropathy, morbid obesity, BPH, venous insufficiency on as needed Lasix, presented with mechanical fall and left ankle fracture. Now s/p ORIF, NWB. Plan for x-ray 12/31.    PT Comments    Today's skilled session continued to focus on mobility. Pt maintains NWB very well with mobility. Acute PT to continue during pt's hospital stay.    Follow Up Recommendations  SNF;Supervision/Assistance - 24 hour     Equipment Recommendations  Rolling walker with 5" wheels;3in1 (PT);Wheelchair (measurements PT);Wheelchair cushion (measurements PT)    Precautions / Restrictions Precautions Precautions: Fall Restrictions LLE Weight Bearing: Non weight bearing    Mobility  Bed Mobility Overal bed mobility: Needs Assistance Bed Mobility: Supine to Sit   Sidelying to sit: Supervision       General bed mobility comments: with HOB 30 degrees and rail used for supine to sit EOB.  Transfers Overall transfer level: Needs assistance Equipment used: Rolling walker (2 wheeled) Transfers: Sit to/from Stand   Stand pivot transfers: Min guard       General transfer comment: cue for safe hand placement as pt places both hands on RW. bed elevated due to pt's tall height to ease transition into standing. demo's safe technique with sitting to the chair.  Ambulation/Gait Ambulation/Gait assistance: Min guard Gait Distance (Feet): 10 Feet Assistive device: Rolling walker (2 wheeled) Gait Pattern/deviations: Step-to pattern (hop to pattern due to NWB) Gait velocity: decreased   General Gait Details: good compliance with NWB status during gait       Cognition Arousal/Alertness: Awake/alert Behavior During Therapy: WFL for tasks  assessed/performed Overall Cognitive Status: Within Functional Limits for tasks assessed             Pertinent Vitals/Pain Pain Assessment: No/denies pain     PT Goals (current goals can now be found in the care plan section) Acute Rehab PT Goals Patient Stated Goal: Wants to be as independent as possible when he gets home PT Goal Formulation: With patient Time For Goal Achievement: 02/15/20 Potential to Achieve Goals: Good Progress towards PT goals: Progressing toward goals    Frequency    Min 2X/week      PT Plan Current plan remains appropriate    AM-PAC PT "6 Clicks" Mobility   Outcome Measure  Help needed turning from your back to your side while in a flat bed without using bedrails?: None Help needed moving from lying on your back to sitting on the side of a flat bed without using bedrails?: None Help needed moving to and from a bed to a chair (including a wheelchair)?: A Little Help needed standing up from a chair using your arms (e.g., wheelchair or bedside chair)?: A Little Help needed to walk in hospital room?: A Little Help needed climbing 3-5 steps with a railing? : Total 6 Click Score: 18    End of Session Equipment Utilized During Treatment: Gait belt Activity Tolerance: Patient tolerated treatment well Patient left: in chair;with call bell/phone within reach Nurse Communication: Mobility status PT Visit Diagnosis: Unsteadiness on feet (R26.81);Other abnormalities of gait and mobility (R26.89)     Time: 1050-1102 PT Time Calculation (min) (ACUTE ONLY): 12 min  Charges:  $Therapeutic Activity: 8-22 mins  Sallyanne Kuster, PTA, CLT Acute Rehab Services [phone] 02/07/20, 11:09 AM  Sallyanne Kuster 02/07/2020, 11:08 AM

## 2020-02-07 NOTE — Progress Notes (Signed)
PROGRESS NOTE    David Fitzgerald  EHM:[id number] DOB: 1960/07/22 DOA: 01/31/2020 PCP: Noni Saupe, MD   Brief Narrative:  HPI per Dr. Mikey College on 01/31/20  HPI: David Fitzgerald is a 60 y.o. male with medical history significant of paroxysmal A. fib on Eliquis, IIDM, diabetic neuropathy, morbid obesity, BPH, venous insufficiency on as needed Lasix, presented with mechanical fall and left ankle fracture.  Patient woke up at night, and tripped and fell on the left leg and ankle, he said he was a little unsteady but denied any prodromes of lightheadedness chest pain or shortness of breath.  Sustained a open fracture of left ankle and came to ED. ED Course: X-ray showed acute left malleolus fracture with mild dislocation of left ankle.  Patient shifted to the OR to have ORIF of left ankle.  In PACU, patient developed in and out uncontrolled A. fib, responded to IV Lopressor.  Blood pressure stable, elevated lactic acid 2.6, WBC WNL.  **Interim History  Patient's heart rate is improved and he is status post ORIF for his left ankle.  PT OT evaluating and recommending SNF.  Patient continues to have a lactic acidosis so we will give him more fluids. Last lactic acid level 3.2 and slightly trending up but has been fluctuating in the 2-3 range but now is finally resolved. He was given 2 L of boluses and was continued on IV fluid hydration but maintenance IVF has now stopped. He is stable to be discharged to a SNF but bed availability is the current barrier for SNF.   Assessment & Plan:   Active Problems:   Open fracture dislocation of left ankle   Ankle fracture   Permanent atrial fibrillation (HCC)   Chronic A Fib now with RVR, improved -Patient appears to be hypovolemic, will give a short course 12 hours IV fluid; Now IVF resumed yesterday and will c/w NS at 75 mL/hr for now given his lactic acidosis -Rate control wise, responded to IV metoprolol in ED and PACU, discussed with surgeon and  PACU nurse, patient appears to be stable to swallow and eat, will resume home metoprolol.  -PRN IV metoprolol 2.5 mg IV every 6 as needed for heart rate greater than 110 -Continue with metoprolol tartrate 25 mg p.o. nightly -Surgeon agreed to restart Eliquis and now back on Apixaban 5 mg po BID and he is tolerating it well  -Make K>4, and check magnesium level -Continue to monitor on telemetry; HR was still in A Fib this AM but was rate controlled in the 80's  Open fracture of left ankle -Status post ORIF -Antibiotics and postop care as per surgery team -Continue with acetaminophen 325-650 mg p.o. every 6 as needed for mild pain, hydrocodone-acetaminophen 1-2 tabs p.o. every 4 as needed moderate pain, as well as hydromorphone IV 0.5-1 mg IV every 4 as needed for severe pain next-also continue with methocarbamol 500 g p.o./IV every 6 as needed for muscle spasms; patient also has oxycodone IR as well as tramadol and will need to consolidate his pain medications but will leave this to orthopedic surgery -Bowel regimen with bisacodyl 10 mg rectally, docusate 100 mg p.o. twice daily, and a Fleet enema -C/w Apixaban 5 mg po BID for VTE prophylaxis  -Orthopedic recommending nonweightbearing and following up with Dr. Carola Frost as an outpatient -Further care per Ortho and PT OT recommending skilled nursing facility; from a medical standpoint he is stable to be discharged however the facilities that he requested  do not have bed availability so currently bed availability is still pending; he was offered a bed at [facility] but wants to look at other options as he had a family member that had a very bad experience of [facility]  Lactic Acidosis, improved -Patient's lactic acid level went from 2.6 and trended up to 3.9 is now trending down to 2.6 yesterday but then went back up to 2.8; today lactic acid level was 3.2 -Continue IV fluid hydration with normal saline at 75 MLS per hour we will hold his  Metformin -He will be given two 1 L normal saline boluses yesterday  -Continue monitor and will not trend lactic acid level anymore given that his lactic acid level today was 1.5  Leukocytosis -Likely in setting of his surgery and less likely infection given that he is afebrile and has no symptoms -WBC went from 9.2 -> 14.7 -> 13.5 -> 12.0 -> 9.7 the day before yesterday -Continue to monitor for signs and symptoms of infection; currently no overt infection noted -Continue monitor and trend and repeat CMP in the a.m.  Renal Insufficiency rule out chronic kidney disease, improved -Patient's BUNs/creatinine went from 13/1.00 and is now 12/1.26 and is now trended back down with IV fluid hydration is now 14/0.91 on last check the day before yesterday  -IV fluid hydration now stopped  -Avoid nephrotoxic medications, contrast dyes, hypotension and renally dose medications -Continue monitor renal function closely and repeat CMP in a.m.  OSA -CPAP HS  IIDM -Because he has a Lactic Acidosis will hold and stop Metformin for now -Blood sugars have been ranging from 127-222 on daily BMP/CMP; The day before Yesterday AM was 130 -Checked Hemoglobin A1c this AM nd was 6.0 -If necessary will be placed on a moderate NovoLog sign scale insulin before meals and at bedtime  Hyperlipidemia -Continue Simvastatin 20 g p.o. nightly  HTN -Last BP was 147/87 -C/w Metoprolol Tartrate 50 mg po Daily and Metoprolol Tartrate 25 mg po qHS -Continue to Monitor BP's per Protocol   BPH -Continue Tamsulosin 0.4 mg p.o. nightly  GERD -Continue with Pantoprazole 40 mg p.o. daily  Diabetic Neuropathy  -Resume Gabapentin 600 mg po TID  Normocytic Anemia -Patient's hemoglobin/hematocrit went from 13.6/40.0 ->12.4/37.4  ->13.2/41.7 -> 12.8/40.4 the day before yesterday -Checked Anemia Panel and it showed of 39, U IBC level of 360, TIBC of 399, saturation ratios of 10%, ferritin level 68, folate level of 9.1,  and vitamin B12 level of 1704 -Started Niferex 150 mg p.o. daily -Continue to monitor for signs and symptoms of bleeding; currently no overt bleeding noted -Repeat CBC in a.m.  Hypokalemia -Patient's K+ was 3.4 on last check  -Replete with po KCl 40 mEQ BID x2 Doses the day before yesterday  -Continue to Monitor and Replete as Necessary -Repeat CMP in the AM  Left Hip Pain and Soreness -Has a small bruise and will obtain a DG hip unilateral with pelvis on the left 2-3 views -Left Hip X-Ray showed "No acute findings. No osseous fracture or dislocation."  Obesity -Complicates overall prognosis and care -Estimated body mass index is 48.12 kg/m as calculated from the following:   Height as of this encounter: 6\' 2"  (1.88 m).   Weight as of this encounter: 170 kg. -Weight loss and Dietary Counseling given  DVT prophylaxis: Per Orthopedic Surgery  Code Status: FULL CODE  Family Communication: No family present at bedside  Disposition Plan: SNF when cleared by orthopedic surgery and bed is available; Lactic Acidosis  has resolved; Anticipating D/Cing Monday 02/07/19 as [facility] are closed and will need Authorization; will need SNF option and bed placement as well as insurance authorization  Status is: Inpatient  Remains inpatient appropriate because:Unsafe d/c plan, IV treatments appropriate due to intensity of illness or inability to take PO and Inpatient level of care appropriate due to severity of illness   Dispo: The patient is from: Home              Anticipated d/c is to: SNF              Anticipated d/c date is: 1 day              Patient currently is medically stable to d/c.  Consultants:   Orthopedic Surgery    Procedures:  PROCEDURES done by Dr. Marcelino Scot on 01/31/20: 1.  ORIF of open trimalleolar fracture dislocation without fixation of the posterior lip. 2.  ORIF of left ankle syndesmosis. 3.  Open treatment of left ankle dislocation with tibiotalar calcaneal  pinning. 4.  Debridement of open fracture including bone, left trimalleolar fracture. 5.  Application of stress under fluoroscopy, left ankle.  Antimicrobials: Anti-infectives (From admission, onward)   Start     Dose/Rate Route Frequency Ordered Stop   02/01/20 0100  ceFAZolin (ANCEF) IVPB 2g/100 mL premix        2 g 200 mL/hr over 30 Minutes Intravenous Every 8 hours 02/01/20 0019 02/01/20 1733   01/31/20 1543  vancomycin (VANCOCIN) powder  Status:  Discontinued          As needed 01/31/20 1544 01/31/20 1638   01/31/20 0430  clindamycin (CLEOCIN) IVPB 900 mg  Status:  Discontinued        900 mg 100 mL/hr over 30 Minutes Intravenous Every 8 hours 01/31/20 0408 02/01/20 0017   01/31/20 0315  ceFAZolin (ANCEF) IVPB 2g/100 mL premix        2 g 200 mL/hr over 30 Minutes Intravenous  Once 01/31/20 0308 01/31/20 0547       Subjective: Seen and examined at bedside and has no acute issues or complaints.  Feels well.  Still no update from SNF bed availability.  Denies any nausea or vomiting.  Doing fairly well.  No other concerns or complaints at this time.  Objective: Vitals:   02/06/20 1444 02/06/20 2046 02/07/20 0357 02/07/20 0845  BP: (!) 153/97 (!) 153/87 (!) 157/73 (!) 147/86  Pulse: 62 69 82 73  Resp: 20 18 20 20   Temp: 98.9 F (37.2 C) 98.8 F (37.1 C) 99 F (37.2 C) 97.9 F (36.6 C)  TempSrc: Oral Oral Oral Oral  SpO2: 95% 94% 92% 93%  Weight:      Height:        Intake/Output Summary (Last 24 hours) at 02/07/2020 1042 Last data filed at 02/07/2020 [id number] Gross per 24 hour  Intake 720 ml  Output 2350 ml  Net -1630 ml   Filed Weights   01/31/20 0255 01/31/20 0308  Weight: (!) 170 kg (!) 170 kg   Examination: Physical Exam:  Constitutional: Caucasian male currently in no acute distress appears calm seated edge of bed back to order food and watching television Respiratory: Slightly diminished to auscultation bilaterally with coarse breath sounds with no appreciable  wheezing, rales, rhonchi.  Has a normal respiratory effort and he has unlabored breathing Cardiovascular: Irregularly irregular but his rate is controlled.  Slight lower extremity edema Abdomen: Soft, nontender, distended secondary body habitus.  Bowel sounds  present Neurologic: Cranial nerves II through XII grossly intact Psychiatric: Pleasant mood and affect.  He is awake and alert and oriented x3.  Data Reviewed: I have personally reviewed following labs and imaging studies  CBC: Recent Labs  Lab 02/01/20 0211 02/02/20 0233 02/03/20 0147 02/04/20 0255  WBC 14.7* 13.5* 12.0* 9.7  NEUTROABS  --  10.2* 7.9* 6.1  HGB 13.6 12.4* 13.2 12.8*  HCT 40.0 37.4* 41.7 40.4  MCV 91.3 94.2 95.0 94.4  PLT 283 252 240 [id number]   Basic Metabolic Panel: Recent Labs  Lab 01/31/20 1912 02/01/20 0211 02/02/20 0233 02/03/20 0147 02/04/20 0255  NA  --  137 140 138 139  K  --  4.0 4.4 3.7 3.4*  CL  --  100 102 101 100  CO2  --  25 28 26 26   GLUCOSE  --  222* 147* 127* 130*  BUN  --  12 15 15 14   CREATININE  --  1.26* 1.02 1.01 0.91  CALCIUM  --  8.9 8.9 8.5* 8.7*  MG 2.1  --  2.1 1.9 2.0  PHOS 2.9  --  3.4 3.1 3.6   GFR: Estimated Creatinine Clearance: 143.2 mL/min (by C-G formula based on SCr of 0.91 mg/dL). Liver Function Tests: Recent Labs  Lab 02/02/20 0233 02/03/20 0147 02/04/20 0255  AST 18 23 22   ALT 16 17 21   ALKPHOS 54 58 72  BILITOT 0.3 0.5 0.8  PROT 7.2 7.0 7.4  ALBUMIN 3.3* 3.3* 3.2*   No results for input(s): LIPASE, AMYLASE in the last 168 hours. No results for input(s): AMMONIA in the last 168 hours. Coagulation Profile: No results for input(s): INR, PROTIME in the last 168 hours. Cardiac Enzymes: No results for input(s): CKTOTAL, CKMB, CKMBINDEX, TROPONINI in the last 168 hours. BNP (last 3 results) No results for input(s): PROBNP in the last 8760 hours. HbA1C: No results for input(s): HGBA1C in the last 72 hours. CBG: Recent Labs  Lab 01/31/20 1147  01/31/20 1643  GLUCAP 121* 173*   Lipid Profile: No results for input(s): CHOL, HDL, LDLCALC, TRIG, CHOLHDL, LDLDIRECT in the last 72 hours. Thyroid Function Tests: No results for input(s): TSH, T4TOTAL, FREET4, T3FREE, THYROIDAB in the last 72 hours. Anemia Panel: No results for input(s): VITAMINB12, FOLATE, FERRITIN, TIBC, IRON, RETICCTPCT in the last 72 hours. Sepsis Labs: Recent Labs  Lab 02/01/20 1250 02/02/20 0855 02/02/20 1344 02/03/20 0147  LATICACIDVEN 2.8* 3.2* 3.3* 1.5    Recent Results (from the past 240 hour(s))  Resp Panel by RT-PCR (Flu A&B, Covid) Nasopharyngeal Swab     Status: None   Collection Time: 01/31/20  3:15 AM   Specimen: Nasopharyngeal Swab; Nasopharyngeal(NP) swabs in vial transport medium  Result Value Ref Range Status   SARS Coronavirus 2 by RT PCR NEGATIVE NEGATIVE Final    Comment: (NOTE) SARS-CoV-2 target nucleic acids are NOT DETECTED.  The SARS-CoV-2 RNA is generally detectable in upper respiratory specimens during the acute phase of infection. The lowest concentration of SARS-CoV-2 viral copies this assay can detect is 138 copies/mL. A negative result does not preclude SARS-Cov-2 infection and should not be used as the sole basis for treatment or other patient management decisions. A negative result may occur with  improper specimen collection/handling, submission of specimen other than nasopharyngeal swab, presence of viral mutation(s) within the areas targeted by this assay, and inadequate number of viral copies(<138 copies/mL). A negative result must be combined with clinical observations, patient history, and epidemiological information. The expected result  is Negative.  Fact Sheet for Patients:  EntrepreneurPulse.com.au  Fact Sheet for Healthcare Providers:  IncredibleEmployment.be  This test is no t yet approved or cleared by the [place] FDA and  has been authorized for detection and/or  diagnosis of SARS-CoV-2 by FDA under an Emergency Use Authorization (EUA). This EUA will remain  in effect (meaning this test can be used) for the duration of the COVID-19 declaration under Section 564(b)(1) of the Act, 21 U.S.C.section 360bbb-3(b)(1), unless the authorization is terminated  or revoked sooner.       Influenza A by PCR NEGATIVE NEGATIVE Final   Influenza B by PCR NEGATIVE NEGATIVE Final    Comment: (NOTE) The Xpert Xpress SARS-CoV-2/FLU/RSV plus assay is intended as an aid in the diagnosis of influenza from Nasopharyngeal swab specimens and should not be used as a sole basis for treatment. Nasal washings and aspirates are unacceptable for Xpert Xpress SARS-CoV-2/FLU/RSV testing.  Fact Sheet for Patients: EntrepreneurPulse.com.au  Fact Sheet for Healthcare Providers: IncredibleEmployment.be  This test is not yet approved or cleared by the [place] FDA and has been authorized for detection and/or diagnosis of SARS-CoV-2 by FDA under an Emergency Use Authorization (EUA). This EUA will remain in effect (meaning this test can be used) for the duration of the COVID-19 declaration under Section 564(b)(1) of the Act, 21 U.S.C. section 360bbb-3(b)(1), unless the authorization is terminated or revoked.  Performed at [facility], [place] [address], [place], [place] [address]   Surgical pcr screen     Status: None   Collection Time: 01/31/20 11:40 AM   Specimen: Nasal Mucosa; Nasal Swab  Result Value Ref Range Status   MRSA, PCR NEGATIVE NEGATIVE Final   Staphylococcus aureus NEGATIVE NEGATIVE Final    Comment: (NOTE) The Xpert SA Assay (FDA approved for NASAL specimens in patients 41 years of age and older), is one component of a comprehensive surveillance program. It is not intended to diagnose infection nor to guide or monitor treatment. Performed at [facility], [place] [address], [place], [place] [address]    Resp Panel by RT-PCR (Flu A&B, Covid) Nasopharyngeal Swab     Status: None   Collection Time: 02/03/20  1:33 PM   Specimen: Nasopharyngeal Swab; Nasopharyngeal(NP) swabs in vial transport medium  Result Value Ref Range Status   SARS Coronavirus 2 by RT PCR NEGATIVE NEGATIVE Final    Comment: (NOTE) SARS-CoV-2 target nucleic acids are NOT DETECTED.  The SARS-CoV-2 RNA is generally detectable in upper respiratory specimens during the acute phase of infection. The lowest concentration of SARS-CoV-2 viral copies this assay can detect is 138 copies/mL. A negative result does not preclude SARS-Cov-2 infection and should not be used as the sole basis for treatment or other patient management decisions. A negative result may occur with  improper specimen collection/handling, submission of specimen other than nasopharyngeal swab, presence of viral mutation(s) within the areas targeted by this assay, and inadequate number of viral copies(<138 copies/mL). A negative result must be combined with clinical observations, patient history, and epidemiological information. The expected result is Negative.  Fact Sheet for Patients:  EntrepreneurPulse.com.au  Fact Sheet for Healthcare Providers:  IncredibleEmployment.be  This test is no t yet approved or cleared by the [place] FDA and  has been authorized for detection and/or diagnosis of SARS-CoV-2 by FDA under an Emergency Use Authorization (EUA). This EUA will remain  in effect (meaning this test can be used) for the duration of the COVID-19 declaration under  Section 564(b)(1) of the Act, 21 U.S.C.section 360bbb-3(b)(1), unless the authorization is terminated  or revoked sooner.       Influenza A by PCR NEGATIVE NEGATIVE Final   Influenza B by PCR NEGATIVE NEGATIVE Final    Comment: (NOTE) The Xpert Xpress SARS-CoV-2/FLU/RSV plus assay is intended as an aid in the diagnosis of influenza from  Nasopharyngeal swab specimens and should not be used as a sole basis for treatment. Nasal washings and aspirates are unacceptable for Xpert Xpress SARS-CoV-2/FLU/RSV testing.  Fact Sheet for Patients: EntrepreneurPulse.com.au  Fact Sheet for Healthcare Providers: IncredibleEmployment.be  This test is not yet approved or cleared by the [place] FDA and has been authorized for detection and/or diagnosis of SARS-CoV-2 by FDA under an Emergency Use Authorization (EUA). This EUA will remain in effect (meaning this test can be used) for the duration of the COVID-19 declaration under Section 564(b)(1) of the Act, 21 U.S.C. section 360bbb-3(b)(1), unless the authorization is terminated or revoked.  Performed at [facility], [place] [address], [place], [place] [address]     RN Pressure Injury Documentation:     Estimated body mass index is 48.12 kg/m as calculated from the following:   Height as of this encounter: 6\' 2"  (1.88 m).   Weight as of this encounter: 170 kg.  Malnutrition Type:   Malnutrition Characteristics:   Nutrition Interventions:      Radiology Studies: No results found. Scheduled Meds: . apixaban  5 mg Oral BID  . dicyclomine  20 mg Oral BID  . docusate sodium  100 mg Oral BID  . DULoxetine  60 mg Oral Daily  . gabapentin  600 mg Oral TID  . iron polysaccharides  150 mg Oral Daily  . metoprolol tartrate  25 mg Oral QHS  . metoprolol tartrate  50 mg Oral Daily  . pantoprazole  40 mg Oral Daily  . simvastatin  20 mg Oral QHS  . tamsulosin  0.4 mg Oral QHS  . traMADol  50 mg Oral Q6H  . vitamin B-12  2,500 mcg Oral Daily   Continuous Infusions: . methocarbamol (ROBAXIN) IV      LOS: 7 days   Kerney Elbe, DO [facility] PAGER is on AMION  If 7PM-7AM, please contact night-coverage [email or web address]

## 2020-02-07 NOTE — Progress Notes (Signed)
Patient declined use of CPAP.

## 2020-02-07 NOTE — Plan of Care (Signed)

## 2020-02-07 NOTE — Plan of Care (Signed)

## 2020-02-08 DIAGNOSIS — I4821 Permanent atrial fibrillation: Secondary | ICD-10-CM | POA: Diagnosis not present

## 2020-02-08 DIAGNOSIS — E872 Acidosis: Secondary | ICD-10-CM | POA: Diagnosis not present

## 2020-02-08 DIAGNOSIS — S82892B Other fracture of left lower leg, initial encounter for open fracture type I or II: Secondary | ICD-10-CM | POA: Diagnosis not present

## 2020-02-08 DIAGNOSIS — R7303 Prediabetes: Secondary | ICD-10-CM | POA: Diagnosis not present

## 2020-02-08 DIAGNOSIS — D72829 Elevated white blood cell count, unspecified: Secondary | ICD-10-CM

## 2020-02-08 LAB — CBC WITH DIFFERENTIAL/PLATELET
Abs Immature Granulocytes: 0.13 10*3/uL — ABNORMAL HIGH (ref 0.00–0.07)
Basophils Absolute: 0.1 10*3/uL (ref 0.0–0.1)
Basophils Relative: 0 %
Eosinophils Absolute: 0.4 10*3/uL (ref 0.0–0.5)
Eosinophils Relative: 4 %
HCT: 36.7 % — ABNORMAL LOW (ref 39.0–52.0)
Hemoglobin: 11.8 g/dL — ABNORMAL LOW (ref 13.0–17.0)
Immature Granulocytes: 1 %
Lymphocytes Relative: 21 %
Lymphs Abs: 2.4 10*3/uL (ref 0.7–4.0)
MCH: 29.8 pg (ref 26.0–34.0)
MCHC: 32.2 g/dL (ref 30.0–36.0)
MCV: 92.7 fL (ref 80.0–100.0)
Monocytes Absolute: 1.4 10*3/uL — ABNORMAL HIGH (ref 0.1–1.0)
Monocytes Relative: 12 %
Neutro Abs: 7 10*3/uL (ref 1.7–7.7)
Neutrophils Relative %: 62 %
Platelets: 283 10*3/uL (ref 150–400)
RBC: 3.96 MIL/uL — ABNORMAL LOW (ref 4.22–5.81)
RDW: 15.3 % (ref 11.5–15.5)
WBC: 11.3 10*3/uL — ABNORMAL HIGH (ref 4.0–10.5)
nRBC: 0 % (ref 0.0–0.2)

## 2020-02-08 LAB — BASIC METABOLIC PANEL
Anion gap: 13 (ref 5–15)
BUN: 14 mg/dL (ref 6–20)
CO2: 24 mmol/L (ref 22–32)
Calcium: 8.6 mg/dL — ABNORMAL LOW (ref 8.9–10.3)
Chloride: 99 mmol/L (ref 98–111)
Creatinine, Ser: 1.08 mg/dL (ref 0.61–1.24)
GFR, Estimated: >60 mL/min (ref >60)
Glucose, Bld: 130 mg/dL — ABNORMAL HIGH (ref 70–99)
Potassium: 3.6 mmol/L (ref 3.5–5.1)
Sodium: 136 mmol/L (ref 135–145)

## 2020-02-08 LAB — PHOSPHORUS: Phosphorus: 3.5 mg/dL (ref 2.5–4.6)

## 2020-02-08 LAB — MAGNESIUM: Magnesium: 2.1 mg/dL (ref 1.7–2.4)

## 2020-02-08 MED ORDER — POTASSIUM CHLORIDE CRYS ER 20 MEQ PO TBCR
40.0000 meq | EXTENDED_RELEASE_TABLET | Freq: Once | ORAL | Status: AC
  Administered 2020-02-08: 40 meq via ORAL
  Filled 2020-02-08: qty 2

## 2020-02-08 NOTE — Progress Notes (Signed)
PROGRESS NOTE    BUCKY LENKIEWICZ  [id number] DOB: [birth year] DOA: 01/31/2020 PCP: Angelina Sheriff, MD   Brief Narrative:  HPI per Dr. Wynetta Fines on 01/31/20  HPI: DENSEL MEHROTRA is a 60 y.o. male with medical history significant of paroxysmal A. fib on Eliquis, IIDM, diabetic neuropathy, morbid obesity, BPH, venous insufficiency on as needed Lasix, presented with mechanical fall and left ankle fracture.  Patient woke up at night, and tripped and fell on the left leg and ankle, he said he was a little unsteady but denied any prodromes of lightheadedness chest pain or shortness of breath.  Sustained a open fracture of left ankle and came to ED. ED Course: X-ray showed acute left malleolus fracture with mild dislocation of left ankle.  Patient shifted to the OR to have ORIF of left ankle.  In PACU, patient developed in and out uncontrolled A. fib, responded to IV Lopressor.  Blood pressure stable, elevated lactic acid 2.6, WBC WNL.  **Interim History  Patient's heart rate is improved and he is status post ORIF for his left ankle.  PT OT evaluating and recommending SNF.  Patient continued to have a lactic acidosis so he was given fluids. Lactic acid  finally resolved. He was given 2 L of boluses and was continued on IV fluid hydration but maintenance IVF has now stopped. He is stable to be discharged to a SNF but bed availability is the current barrier for SNF.   Assessment & Plan:   Active Problems:   Open fracture dislocation of left ankle   Ankle fracture   Permanent atrial fibrillation (HCC)   Chronic A Fib now with RVR, improved -Patient appears to be hypovolemic, will give a short course 12 hours IV fluid; Now IVF resumed yesterday and will c/w NS at 75 mL/hr for now given his lactic acidosis -Rate control wise, responded to IV metoprolol in ED and PACU, discussed with surgeon and PACU nurse, patient appears to be stable to swallow and eat, will resume home metoprolol.  -PRN IV  metoprolol 2.5 mg IV every 6 as needed for heart rate greater than 110 -Continue with metoprolol tartrate 25 mg p.o. nightly -Surgeon agreed to restart Eliquis and now back on Apixaban 5 mg po BID and he is tolerating it well  -Make K>4, and check magnesium level -Continue to monitor on telemetry; HR was still in A Fib this AM but was rate controlled in the 80's  Open fracture of left ankle -Status post ORIF -Antibiotics and postop care as per surgery team -Continue with acetaminophen 325-650 mg p.o. every 6 as needed for mild pain, hydrocodone-acetaminophen 1-2 tabs p.o. every 4 as needed moderate pain, as well as hydromorphone IV 0.5-1 mg IV every 4 as needed for severe pain next-also continue with methocarbamol 500 g p.o./IV every 6 as needed for muscle spasms; patient also has oxycodone IR as well as tramadol and will need to consolidate his pain medications but will leave this to orthopedic surgery -Bowel regimen with bisacodyl 10 mg rectally, docusate 100 mg p.o. twice daily, and a Fleet enema -C/w Apixaban 5 mg po BID for VTE prophylaxis  -Orthopedic recommending nonweightbearing and following up with Dr. Marcelino Scot as an outpatient -Further care per Ortho and PT OT recommending skilled nursing facility; from a medical standpoint he is stable to be discharged however the facilities that he requested do not have bed availability so currently bed availability is still pending; he was offered a bed at [facility]  but wants to look at other options as he had a family member that had a very bad experience of [facility]  Lactic Acidosis, improved -Patient's lactic acid level went from 2.6 and trended up to 3.9 is now trending down to 2.6 yesterday but then went back up to 2.8; today lactic acid level was 3.2 -Continue IV fluid hydration with normal saline at 75 MLS per hour we will hold his Metformin -He will be given two 1 L normal saline boluses yesterday  -Continue monitor and will not trend  lactic acid level anymore given that his lactic acid level today was 1.5  Leukocytosis -Likely in setting of his surgery and less likely infection given that he is afebrile and has no symptoms -WBC went from 9.2 -> 14.7 -> 13.5 -> 12.0 -> 9.7 and today was 11.3 and likely reactive  -Continue to monitor for signs and symptoms of infection; currently no overt infection noted -Continue monitor and trend and repeat CMP in the a.m.  Renal Insufficiency rule out chronic kidney disease, improved -Patient's BUNs/creatinine went from 13/1.00 and is now 12/1.26 and is now trended back down with IV fluid hydration to 14/0.91 and is now 14/1.08 -IV fluid hydration now stopped  -Avoid nephrotoxic medications, contrast dyes, hypotension and renally dose medications -Continue monitor renal function closely and repeat CMP in a.m.  OSA -CPAP HS  IIDM -Because he has a Lactic Acidosis will hold and stop Metformin for now -Blood sugars have been ranging from 127-222 on daily BMP/CMP; Today it was 130 -Checked Hemoglobin A1c this AM nd was 6.0 -If necessary will be placed on a moderate NovoLog sign scale insulin before meals and at bedtime  Hyperlipidemia -Continue Simvastatin 20 g p.o. nightly  HTN -Last BP was 127/78 -C/w Metoprolol Tartrate 50 mg po Daily and Metoprolol Tartrate 25 mg po qHS -Continue to Monitor BP's per Protocol   BPH -Continue Tamsulosin 0.4 mg p.o. nightly  GERD -Continue with Pantoprazole 40 mg p.o. daily  Diabetic Neuropathy  -Resume Gabapentin 600 mg po TID  Normocytic Anemia -Patient's hemoglobin/hematocrit went from 13.6/40.0 ->12.4/37.4  ->13.2/41.7 -> 12.8/40.4 -> 11.8/36.7 -Checked Anemia Panel and it showed of 39, U IBC level of 360, TIBC of 399, saturation ratios of 10%, ferritin level 68, folate level of 9.1, and vitamin B12 level of 1704 -C/w Niferex 150 mg p.o. daily -Continue to monitor for signs and symptoms of bleeding; currently no overt bleeding  noted -Repeat CBC in a.m.  Hypokalemia -Patient's K+ was 3.6 on last check  -Replete with po KCl 40 mEQ x1 today  -Continue to Monitor and Replete as Necessary -Repeat CMP in the AM  Left Hip Pain and Soreness -Has a small bruise and will obtain a DG hip unilateral with pelvis on the left 2-3 views -Left Hip X-Ray showed "No acute findings. No osseous fracture or dislocation."  Morbid Obesity -Complicates overall prognosis and care -Estimated body mass index is 48.12 kg/m as calculated from the following:   Height as of this encounter: 6\' 2"  (1.88 m).   Weight as of this encounter: 170 kg. -Weight loss and Dietary Counseling given  DVT prophylaxis: Per Orthopedic Surgery  Code Status: FULL CODE  Family Communication: No family present at bedside  Disposition Plan: SNF when cleared by orthopedic surgery and bed is available; Lactic Acidosis has resolved; He will need SNF option and bed placement as well as insurance authorization  Status is: Inpatient  Remains inpatient appropriate because:Unsafe d/c plan, IV treatments appropriate  due to intensity of illness or inability to take PO and Inpatient level of care appropriate due to severity of illness   Dispo: The patient is from: Home              Anticipated d/c is to: SNF              Anticipated d/c date is: 1 day              Patient currently is medically stable to d/c.  Consultants:   Orthopedic Surgery    Procedures:  PROCEDURES done by Dr. Carola Frost on 01/31/20: 1.  ORIF of open trimalleolar fracture dislocation without fixation of the posterior lip. 2.  ORIF of left ankle syndesmosis. 3.  Open treatment of left ankle dislocation with tibiotalar calcaneal pinning. 4.  Debridement of open fracture including bone, left trimalleolar fracture. 5.  Application of stress under fluoroscopy, left ankle.  Antimicrobials: Anti-infectives (From admission, onward)   Start     Dose/Rate Route Frequency Ordered Stop   02/01/20  0100  ceFAZolin (ANCEF) IVPB 2g/100 mL premix        2 g 200 mL/hr over 30 Minutes Intravenous Every 8 hours 02/01/20 0019 02/01/20 1733   01/31/20 1543  vancomycin (VANCOCIN) powder  Status:  Discontinued          As needed 01/31/20 1544 01/31/20 1638   01/31/20 0430  clindamycin (CLEOCIN) IVPB 900 mg  Status:  Discontinued        900 mg 100 mL/hr over 30 Minutes Intravenous Every 8 hours 01/31/20 0408 02/01/20 0017   01/31/20 0315  ceFAZolin (ANCEF) IVPB 2g/100 mL premix        2 g 200 mL/hr over 30 Minutes Intravenous  Once 01/31/20 0308 01/31/20 0547       Subjective: Seen and examined at bedside and was feeling well and in NAD. No Nausea or vomiting. No CP or SOB. Ankle feels ok. Stable to D/C to SNF when bed is available and insurance authorization has been obtained.   Objective: Vitals:   02/07/20 1507 02/07/20 1900 02/08/20 0326 02/08/20 0900  BP: 138/84 (!) 159/77 128/88 127/78  Pulse: 79 (!) 109 77 78  Resp: 18 17 15 16   Temp: 98.6 F (37 C) 98.6 F (37 C) 99 F (37.2 C) 98.5 F (36.9 C)  TempSrc: Oral Oral Oral Oral  SpO2: 95% 96% 94% 96%  Weight:      Height:        Intake/Output Summary (Last 24 hours) at 02/08/2020 1046 Last data filed at 02/08/2020 0900 Gross per 24 hour  Intake 600 ml  Output --  Net 600 ml   Filed Weights   01/31/20 0255 01/31/20 0308  Weight: (!) 170 kg (!) 170 kg   Examination: Physical Exam:  Constitutional: WN/WD morbidly obese Caucasian male currently in no acute distress appears calm and awoken from sleep Eyes: Lids and conjunctivae normal, sclerae anicteric  ENMT: External Ears, Nose appear normal. Grossly normal hearing.  Neck: Appears normal, supple, no cervical masses, normal ROM, no appreciable thyromegaly; no JVD but this is difficult to assess given his body habitus Respiratory: Diminished to auscultation bilaterally, no wheezing, rales, rhonchi or crackles. Normal respiratory effort and patient is not tachypenic. No  accessory muscle use.  Unlabored breathing Cardiovascular: Irregularly irregular but rate is controlled., no murmurs / rubs / gallops. S1 and S2 auscultated.  Minimal extremity edema Abdomen: Soft, non-tender, distended secondary to body habitus. Bowel sounds positive.  GU: Deferred. Musculoskeletal: No clubbing / cyanosis of digits/nails. No joint deformity upper and lower extremities the left ankle is wrapped in Ace bandage Skin: No rashes, lesions, ulcers on limited skin evaluation. No induration; Warm and dry.  Neurologic: CN 2-12 grossly intact with no focal deficits. Romberg sign and cerebellar reflexes not assessed.  Psychiatric: Normal judgment and insight. Alert and oriented x 3. Normal mood and appropriate affect.   Data Reviewed: I have personally reviewed following labs and imaging studies  CBC: Recent Labs  Lab 02/02/20 0233 02/03/20 0147 02/04/20 0255 02/08/20 0306  WBC 13.5* 12.0* 9.7 11.3*  NEUTROABS 10.2* 7.9* 6.1 7.0  HGB 12.4* 13.2 12.8* 11.8*  HCT 37.4* 41.7 40.4 36.7*  MCV 94.2 95.0 94.4 92.7  PLT 252 240 239 [id number]   Basic Metabolic Panel: Recent Labs  Lab 02/02/20 0233 02/03/20 0147 02/04/20 0255 02/08/20 0306  NA 140 138 139 136  K 4.4 3.7 3.4* 3.6  CL 102 101 100 99  CO2 28 26 26 24   GLUCOSE 147* 127* 130* 130*  BUN 15 15 14 14   CREATININE 1.02 1.01 0.91 1.08  CALCIUM 8.9 8.5* 8.7* 8.6*  MG 2.1 1.9 2.0 2.1  PHOS 3.4 3.1 3.6 3.5   GFR: Estimated Creatinine Clearance: 120.7 mL/min (by C-G formula based on SCr of 1.08 mg/dL). Liver Function Tests: Recent Labs  Lab 02/02/20 0233 02/03/20 0147 02/04/20 0255  AST 18 23 22   ALT 16 17 21   ALKPHOS 54 58 72  BILITOT 0.3 0.5 0.8  PROT 7.2 7.0 7.4  ALBUMIN 3.3* 3.3* 3.2*   No results for input(s): LIPASE, AMYLASE in the last 168 hours. No results for input(s): AMMONIA in the last 168 hours. Coagulation Profile: No results for input(s): INR, PROTIME in the last 168 hours. Cardiac Enzymes: No  results for input(s): CKTOTAL, CKMB, CKMBINDEX, TROPONINI in the last 168 hours. BNP (last 3 results) No results for input(s): PROBNP in the last 8760 hours. HbA1C: No results for input(s): HGBA1C in the last 72 hours. CBG: No results for input(s): GLUCAP in the last 168 hours. Lipid Profile: No results for input(s): CHOL, HDL, LDLCALC, TRIG, CHOLHDL, LDLDIRECT in the last 72 hours. Thyroid Function Tests: No results for input(s): TSH, T4TOTAL, FREET4, T3FREE, THYROIDAB in the last 72 hours. Anemia Panel: No results for input(s): VITAMINB12, FOLATE, FERRITIN, TIBC, IRON, RETICCTPCT in the last 72 hours. Sepsis Labs: Recent Labs  Lab 02/01/20 1250 02/02/20 0855 02/02/20 1344 02/03/20 0147  LATICACIDVEN 2.8* 3.2* 3.3* 1.5    Recent Results (from the past 240 hour(s))  Resp Panel by RT-PCR (Flu A&B, Covid) Nasopharyngeal Swab     Status: None   Collection Time: 01/31/20  3:15 AM   Specimen: Nasopharyngeal Swab; Nasopharyngeal(NP) swabs in vial transport medium  Result Value Ref Range Status   SARS Coronavirus 2 by RT PCR NEGATIVE NEGATIVE Final    Comment: (NOTE) SARS-CoV-2 target nucleic acids are NOT DETECTED.  The SARS-CoV-2 RNA is generally detectable in upper respiratory specimens during the acute phase of infection. The lowest concentration of SARS-CoV-2 viral copies this assay can detect is 138 copies/mL. A negative result does not preclude SARS-Cov-2 infection and should not be used as the sole basis for treatment or other patient management decisions. A negative result may occur with  improper specimen collection/handling, submission of specimen other than nasopharyngeal swab, presence of viral mutation(s) within the areas targeted by this assay, and inadequate number of viral copies(<138 copies/mL). A negative result must be combined  with clinical observations, patient history, and epidemiological information. The expected result is Negative.  Fact Sheet for  Patients:  EntrepreneurPulse.com.au  Fact Sheet for Healthcare Providers:  IncredibleEmployment.be  This test is no t yet approved or cleared by the [place] FDA and  has been authorized for detection and/or diagnosis of SARS-CoV-2 by FDA under an Emergency Use Authorization (EUA). This EUA will remain  in effect (meaning this test can be used) for the duration of the COVID-19 declaration under Section 564(b)(1) of the Act, 21 U.S.C.section 360bbb-3(b)(1), unless the authorization is terminated  or revoked sooner.       Influenza A by PCR NEGATIVE NEGATIVE Final   Influenza B by PCR NEGATIVE NEGATIVE Final    Comment: (NOTE) The Xpert Xpress SARS-CoV-2/FLU/RSV plus assay is intended as an aid in the diagnosis of influenza from Nasopharyngeal swab specimens and should not be used as a sole basis for treatment. Nasal washings and aspirates are unacceptable for Xpert Xpress SARS-CoV-2/FLU/RSV testing.  Fact Sheet for Patients: EntrepreneurPulse.com.au  Fact Sheet for Healthcare Providers: IncredibleEmployment.be  This test is not yet approved or cleared by the [place] FDA and has been authorized for detection and/or diagnosis of SARS-CoV-2 by FDA under an Emergency Use Authorization (EUA). This EUA will remain in effect (meaning this test can be used) for the duration of the COVID-19 declaration under Section 564(b)(1) of the Act, 21 U.S.C. section 360bbb-3(b)(1), unless the authorization is terminated or revoked.  Performed at [facility], [place] [address] [place], [place] [address]   Surgical pcr screen     Status: None   Collection Time: 01/31/20 11:40 AM   Specimen: Nasal Mucosa; Nasal Swab  Result Value Ref Range Status   MRSA, PCR NEGATIVE NEGATIVE Final   Staphylococcus aureus NEGATIVE NEGATIVE Final    Comment: (NOTE) The Xpert SA Assay (FDA approved for NASAL specimens in  patients 4 years of age and older), is one component of a comprehensive surveillance program. It is not intended to diagnose infection nor to guide or monitor treatment. Performed at [facility], [place] [address], [place], [place] [address]   Resp Panel by RT-PCR (Flu A&B, Covid) Nasopharyngeal Swab     Status: None   Collection Time: 02/03/20  1:33 PM   Specimen: Nasopharyngeal Swab; Nasopharyngeal(NP) swabs in vial transport medium  Result Value Ref Range Status   SARS Coronavirus 2 by RT PCR NEGATIVE NEGATIVE Final    Comment: (NOTE) SARS-CoV-2 target nucleic acids are NOT DETECTED.  The SARS-CoV-2 RNA is generally detectable in upper respiratory specimens during the acute phase of infection. The lowest concentration of SARS-CoV-2 viral copies this assay can detect is 138 copies/mL. A negative result does not preclude SARS-Cov-2 infection and should not be used as the sole basis for treatment or other patient management decisions. A negative result may occur with  improper specimen collection/handling, submission of specimen other than nasopharyngeal swab, presence of viral mutation(s) within the areas targeted by this assay, and inadequate number of viral copies(<138 copies/mL). A negative result must be combined with clinical observations, patient history, and epidemiological information. The expected result is Negative.  Fact Sheet for Patients:  EntrepreneurPulse.com.au  Fact Sheet for Healthcare Providers:  IncredibleEmployment.be  This test is no t yet approved or cleared by the [place] FDA and  has been authorized for detection and/or diagnosis of SARS-CoV-2 by FDA under an Emergency Use Authorization (EUA). This EUA will remain  in effect (meaning this test  can be used) for the duration of the COVID-19 declaration under Section 564(b)(1) of the Act, 21 U.S.C.section 360bbb-3(b)(1), unless the authorization is terminated   or revoked sooner.       Influenza A by PCR NEGATIVE NEGATIVE Final   Influenza B by PCR NEGATIVE NEGATIVE Final    Comment: (NOTE) The Xpert Xpress SARS-CoV-2/FLU/RSV plus assay is intended as an aid in the diagnosis of influenza from Nasopharyngeal swab specimens and should not be used as a sole basis for treatment. Nasal washings and aspirates are unacceptable for Xpert Xpress SARS-CoV-2/FLU/RSV testing.  Fact Sheet for Patients: EntrepreneurPulse.com.au  Fact Sheet for Healthcare Providers: IncredibleEmployment.be  This test is not yet approved or cleared by the [place] FDA and has been authorized for detection and/or diagnosis of SARS-CoV-2 by FDA under an Emergency Use Authorization (EUA). This EUA will remain in effect (meaning this test can be used) for the duration of the COVID-19 declaration under Section 564(b)(1) of the Act, 21 U.S.C. section 360bbb-3(b)(1), unless the authorization is terminated or revoked.  Performed at [facility], [place] [address] [place], [place] [address]     RN Pressure Injury Documentation:     Estimated body mass index is 48.12 kg/m as calculated from the following:   Height as of this encounter: 6\' 2"  (1.88 m).   Weight as of this encounter: 170 kg.  Malnutrition Type:   Malnutrition Characteristics:   Nutrition Interventions:      Radiology Studies: No results found. Scheduled Meds: . apixaban  5 mg Oral BID  . dicyclomine  20 mg Oral BID  . docusate sodium  100 mg Oral BID  . DULoxetine  60 mg Oral Daily  . gabapentin  600 mg Oral TID  . iron polysaccharides  150 mg Oral Daily  . metoprolol tartrate  25 mg Oral QHS  . metoprolol tartrate  50 mg Oral Daily  . pantoprazole  40 mg Oral Daily  . potassium chloride  40 mEq Oral Once  . simvastatin  20 mg Oral QHS  . tamsulosin  0.4 mg Oral QHS  . traMADol  50 mg Oral Q6H  . vitamin B-12  2,500 mcg Oral Daily    Continuous Infusions: . methocarbamol (ROBAXIN) IV      LOS: 8 days   Kerney Elbe, DO [facility] PAGER is on AMION  If 7PM-7AM, please contact night-coverage [email or web address]

## 2020-02-08 NOTE — Progress Notes (Signed)
Patient refused CPAP.

## 2020-02-08 NOTE — Plan of Care (Signed)

## 2020-02-08 NOTE — Plan of Care (Signed)

## 2020-02-08 NOTE — TOC Progression Note (Signed)
Transition of Care [facility]) - Progression Note    Patient Details  Name: VINICIO LYNK MRN: [id number] Date of Birth: 04/11/1960  [facility] CM/SW Contact  Okey Dupre Lazaro Arms, LCSW Phone Number: 02/08/2020, 6:44 PM  Clinical Narrative:  Visited with patient at the bedside and his mother, Larnie Heart was present. Patient advised that [facility] initiated authorization. Patient indicated that He does not want [facility] and his mother expressed concerns about their ratings. While in room. CSW contacted Wille Celeste, admissions Interior and spatial designer at [facility] and informed her that the patient does not want her facility and she can cancel authorization.  Patient and mother informed CSW that they want a facility in [place] or surrounding areas.  They asked that his information also be sent to [facility] [facility], arts) and check on [facility], along with other facilities.   Expected Discharge Plan: Skilled Nursing Facility Barriers to Discharge: Continued Medical Work up  Expected Discharge Plan and Services  Expected Discharge Plan: Skilled Nursing Facility   Discharge Planning Services: CM Consult Post Acute Care Choice: Skilled Nursing Facility Living arrangements for the past 2 months: Single Family Home                                     Social Determinants of Health (SDOH) Interventions  No SDOH interventions requested or needed at this time.  Readmission Risk Interventions Readmission Risk Prevention Plan 02/02/2020  Post Dischage Appt Complete  Medication Screening Complete  Transportation Screening Complete  Some recent data might be hidden

## 2020-02-09 DIAGNOSIS — D62 Acute posthemorrhagic anemia: Secondary | ICD-10-CM

## 2020-02-09 NOTE — Progress Notes (Addendum)
Occupational Therapy Treatment Patient Details Name: David Fitzgerald MRN: [id number] DOB: 1960/08/11 Today's Date: 02/09/2020    History of present illness David Fitzgerald is a 60 y.o. male with medical history significant of paroxysmal A. fib on Eliquis, IIDM, diabetic neuropathy, morbid obesity, BPH, venous insufficiency on as needed Lasix, presented with mechanical fall and left ankle fracture. Now s/p ORIF, NWB. Plan for x-ray 12/31.   OT comments  Pt progressing well towards OT goals, able to demo routine dressing/bathing tasks sitting EOB using lateral leans with setup only. Pt overall min guard for mobility in room using RW, good adherence to NWB precautions but noted with HR up to 162bpm during these tasks. Pt HR normalizes after seated rest break. Pt able to return demo UE HEP using theraband well, minor cues for pacing and monitoring of HR to ensure best practice. Pt's mother present during session and assisting pt throughout. Educated on need for tub bench at home to ensure safety with transfers with plan to assess tub bench transfers in future sessions.    Follow Up Recommendations  SNF;Supervision/Assistance - 24 hour    Equipment Recommendations  Wheelchair (measurements OT);Wheelchair cushion (measurements OT);Tub/shower bench;Other (comment) (RW; bariatric DME)    Recommendations for Other Services      Precautions / Restrictions Precautions Precautions: Fall Precaution Comments: watch HR Restrictions Weight Bearing Restrictions: Yes LLE Weight Bearing: Non weight bearing       Mobility Bed Mobility               General bed mobility comments: sitting EOB on entry  Transfers Overall transfer level: Needs assistance Equipment used: Rolling walker (2 wheeled) Transfers: Sit to/from Stand Sit to Stand: Min guard         General transfer comment: min guard for sit to stand at bedside, good power up and advancement. Able to demo short mobility in room and  turn with RW at min guard for safety, no overt LOB    Balance Overall balance assessment: Needs assistance Sitting-balance support: No upper extremity supported;Feet supported Sitting balance-[name]: Good     Standing balance support: Bilateral upper extremity supported;During functional activity Standing balance-[name]: Poor Standing balance comment: reliant on external support due to NWB                           ADL either performed or assessed with clinical judgement   ADL Overall ADL's : Needs assistance/impaired     Grooming: Set up;Sitting;Applying deodorant;Wash/dry face   Upper Body Bathing: Set up;Sitting   Lower Body Bathing: Set up;Sitting/lateral leans Lower Body Bathing Details (indicate cue type and reason): Use of lateral leans for posterior hygiene Upper Body Dressing : Set up;Sitting Upper Body Dressing Details (indicate cue type and reason): Setup to don clean hospital gown Lower Body Dressing: Set up;Sitting/lateral leans Lower Body Dressing Details (indicate cue type and reason): Setup to don clean underwear and R sock sitting EOB using lateral leans             Functional mobility during ADLs: Min guard;Rolling walker General ADL Comments: Pt with good adherence to WB precautions. Limited by decreased endurance, increasing HR     Vision   Vision Assessment?: No apparent visual deficits   Perception     Praxis      Cognition Arousal/Alertness: Awake/alert Behavior During Therapy: WFL for tasks assessed/performed Overall Cognitive Status: Within Functional Limits for tasks assessed  Exercises Exercises: General Upper Extremity General Exercises - Upper Extremity Shoulder Flexion: Strengthening;Both;5 reps;Seated;Theraband Theraband Level (Shoulder Flexion): Level 3 (Green) Shoulder Horizontal ABduction: Strengthening;5 reps;Both;Seated;Theraband Theraband Level  (Shoulder Horizontal Abduction): Level 3 (Green) Elbow Flexion: Strengthening;Both;5 reps;Seated;Theraband Theraband Level (Elbow Flexion): Level 3 (Green) Elbow Extension: Strengthening;Both;5 reps;Seated;Theraband Theraband Level (Elbow Extension): Level 3 (Green)   Shoulder Instructions       General Comments Pt's mother present during session, assisting pt with tasks. Encouraged pt to consider tub bench for safety at home due to [facility] precautions. Discussed decreased ability to access bathroom with RW or wheelchair. pt inquiring about knee scooter    Pertinent Vitals/ Pain       Pain Assessment: No/denies pain (neuropathy)  Home Living                                          Prior Functioning/Environment              Frequency  Min 2X/week        Progress Toward Goals  OT Goals(current goals can now be found in the care plan section)  Progress towards OT goals: Progressing toward goals  Acute Rehab OT Goals Patient Stated Goal: Wants to be as independent as possible when he gets home OT Goal Formulation: With patient Time For Goal Achievement: 02/15/20 Potential to Achieve Goals: Good ADL Goals Pt Will Perform Lower Body Bathing: with set-up;sitting/lateral leans;with adaptive equipment Pt Will Perform Lower Body Dressing: with set-up;sitting/lateral leans;sit to/from stand;with adaptive equipment Pt Will Transfer to Toilet: with set-up;bedside commode;stand pivot transfer Pt Will Perform Toileting - Clothing Manipulation and hygiene: with set-up;sitting/lateral leans;sit to/from stand Pt/caregiver will Perform Home Exercise Program: Increased strength;Both right and left upper extremity;With theraband;Independently;With written HEP provided  Plan Discharge plan remains appropriate    Co-evaluation                 AM-PAC OT "6 Clicks" Daily Activity     Outcome Measure   Help from another person eating meals?: None Help from another  person taking care of personal grooming?: A Little Help from another person toileting, which includes using toliet, bedpan, or urinal?: A Little Help from another person bathing (including washing, rinsing, drying)?: A Little Help from another person to put on and taking off regular upper body clothing?: A Little Help from another person to put on and taking off regular lower body clothing?: A Little 6 Click Score: 19    End of Session Equipment Utilized During Treatment: Gait belt;Rolling walker  OT Visit Diagnosis: Unsteadiness on feet (R26.81);Other abnormalities of gait and mobility (R26.89);Muscle weakness (generalized) (M62.81)   Activity Tolerance Patient tolerated treatment well   Patient Left in bed;with call bell/phone within reach;with family/visitor present;Other (comment) (RN present)   Nurse Communication Mobility status        Time: [phone] OT Time Calculation (min): 38 min  Charges: OT General Charges $OT Visit: 1 Visit OT Treatments $Self Care/Home Management : 23-37 mins $Therapeutic Exercise: 8-22 mins  Layla Maw, OTR/L   Layla Maw 02/09/2020, 1:09 PM

## 2020-02-09 NOTE — Progress Notes (Signed)
OT Cancellation Note  Patient Details Name: JAMARQUES PINEDO MRN: [id number] DOB: 1960/12/23   Cancelled Treatment:    Reason Eval/Treat Not Completed: Patient declined, no reason specified. Attempted OT session this AM with pt politely declining and reports he typically doesn't wake up until 11AM. Will re-attempt OT session later as time allows.   Lorre Munroe 02/09/2020, 8:43 AM

## 2020-02-09 NOTE — Progress Notes (Signed)
PROGRESS NOTE   David Fitzgerald  [id number]    DOB: [birth year]    DOA: 01/31/2020  PCP: Angelina Sheriff, MD   I have briefly reviewed patients previous medical records in [facility].  Chief Complaint  Patient presents with  . Fall    Brief Narrative:  60 year old male, lives alone, IADLs, PMH of paroxysmal A. fib on Eliquis (follows with Dr. Bettina Gavia, Cardiology), type II DM with neuropathy, morbid obesity, BPH, venous insufficiency presented with a mechanical fall and open left ankle fracture.  Admitted by orthopedics and underwent ORIF of left ankle.  While in PACU, developed intermittent A. fib with RVR that responded to IV Lopressor.  TRH consulted for management.  Currently stable for DC to SNF pending bed and insurance.   Assessment & Plan:  Active Problems:   Open fracture dislocation of left ankle   Ankle fracture   Permanent atrial fibrillation (HCC)   Chronic A. fib, transiently with RVR postop Mostly rate controlled on current dose of metoprolol 50 mg daily and 25 mg at bedtime.  Has been resumed on apixaban anticoagulation.  Brief and occasional episodes of RVR earlier this morning but asymptomatic.  Continue monitoring on telemetry while here.  Outpatient follow-up with Dr. Bettina Gavia upon discharge.  Open fracture of left ankle S/p ORIF.  Management per primary service.  Patient stable for DC to next level of care from a medical standpoint.  Lactic acidosis: Peaked to 3.9.  Unclear etiology.  May be due to volume depletion.  Resolved.  Leukocytosis Likely stress margination.  No clinical concerns for infection.  Monitoring off of antibiotics.  AKI Resolved.  OSA: Continue nightly CPAP.  Type II DM with neuropathy Metformin held.  A1c 6.  May resume Metformin at discharge.  Remains on gabapentin.  Hyperlipidemia Continue simvastatin.  Essential hypertension Mildly uncontrolled at times.  Continue metoprolol dose as above.  BPH Continue  tamsulosin.  GERD Continue pantoprazole.  Acute blood loss anemia Suspect due to surgery.  Anemia panel results reviewed.  Suspect some degree of iron deficiency.  Continue iron supplements.  Follow CBC periodically as outpatient including in a.m.  Hypokalemia Replaced.  Magnesium normal.  Left hip pain Suspect due to fall.  X-rays without fracture or dislocation.  Body mass index is 48.12 kg/m./Morbid obesity Outpatient follow-up.   DVT prophylaxis: SCDs Start: 02/01/20 0020 SCDs Start: 01/31/20 0403 patient also on apixaban   Code Status: Full Code Family Communication: None at bedside. Disposition:  Status is: Inpatient  Remains inpatient appropriate because:Unsafe d/c plan   Dispo: The patient is from: Home              Anticipated d/c is to: SNF              Anticipated d/c date is: 1 day              Patient currently is medically stable to d/c.        Consultants:   TRH are consultants.  Procedures:   As noted above.  Antimicrobials:    Anti-infectives (From admission, onward)   Start     Dose/Rate Route Frequency Ordered Stop   02/01/20 0100  ceFAZolin (ANCEF) IVPB 2g/100 mL premix        2 g 200 mL/hr over 30 Minutes Intravenous Every 8 hours 02/01/20 0019 02/01/20 1733   01/31/20 1543  vancomycin (VANCOCIN) powder  Status:  Discontinued          As needed  01/31/20 1544 01/31/20 1638   01/31/20 0430  clindamycin (CLEOCIN) IVPB 900 mg  Status:  Discontinued        900 mg 100 mL/hr over 30 Minutes Intravenous Every 8 hours 01/31/20 0408 02/01/20 0017   01/31/20 0315  ceFAZolin (ANCEF) IVPB 2g/100 mL premix        2 g 200 mL/hr over 30 Minutes Intravenous  Once 01/31/20 0308 01/31/20 0547        Subjective:  Denies complaints.  No chest pain, palpitations.  No leg pain reported either.  Objective:   Vitals:   02/08/20 1958 02/09/20 0529 02/09/20 0923 02/09/20 1348  BP: (!) 163/96 (!) 150/90 135/87 132/74  Pulse: 99 83 81 78  Resp: 18 16  17 18   Temp: 98.9 F (37.2 C) 98.6 F (37 C) 98.8 F (37.1 C) 97.7 F (36.5 C)  TempSrc: Oral Oral Oral Oral  SpO2: 94% 93% 95% 97%  Weight:      Height:        General exam: Pleasant middle-age male, moderately built and morbidly obese lying comfortably propped up in bed. Respiratory system: Clear to auscultation. Respiratory effort normal. Cardiovascular system: S1 & S2 heard, irregularly irregular. No JVD, murmurs, rubs, gallops or clicks. No pedal edema.  Telemetry personally reviewed: Mostly controlled ventricular rate except a brief period of ~ 20 minutes early this morning when he had A. fib with RVR up to 130s Gastrointestinal system: Abdomen is nondistended, soft and nontender. No organomegaly or masses felt. Normal bowel sounds heard. Central nervous system: Alert and oriented. No focal neurological deficits. Extremities: Symmetric 5 x 5 power.  Left leg in postop cast. Skin: No rashes, lesions or ulcers Psychiatry: Judgement and insight appear normal. Mood & affect appropriate.     Data Reviewed:   I have personally reviewed following labs and imaging studies   CBC: Recent Labs  Lab 02/03/20 0147 02/04/20 0255 02/08/20 0306  WBC 12.0* 9.7 11.3*  NEUTROABS 7.9* 6.1 7.0  HGB 13.2 12.8* 11.8*  HCT 41.7 40.4 36.7*  MCV 95.0 94.4 92.7  PLT 240 239 [id number]    Basic Metabolic Panel: Recent Labs  Lab 02/03/20 0147 02/04/20 0255 02/08/20 0306  NA 138 139 136  K 3.7 3.4* 3.6  CL 101 100 99  CO2 26 26 24   GLUCOSE 127* 130* 130*  BUN 15 14 14   CREATININE 1.01 0.91 1.08  CALCIUM 8.5* 8.7* 8.6*  MG 1.9 2.0 2.1  PHOS 3.1 3.6 3.5    Liver Function Tests: Recent Labs  Lab 02/03/20 0147 02/04/20 0255  AST 23 22  ALT 17 21  ALKPHOS 58 72  BILITOT 0.5 0.8  PROT 7.0 7.4  ALBUMIN 3.3* 3.2*    CBG: No results for input(s): GLUCAP in the last 168 hours.  Microbiology Studies:   Recent Results (from the past 240 hour(s))  Resp Panel by RT-PCR (Flu A&B,  Covid) Nasopharyngeal Swab     Status: None   Collection Time: 01/31/20  3:15 AM   Specimen: Nasopharyngeal Swab; Nasopharyngeal(NP) swabs in vial transport medium  Result Value Ref Range Status   SARS Coronavirus 2 by RT PCR NEGATIVE NEGATIVE Final    Comment: (NOTE) SARS-CoV-2 target nucleic acids are NOT DETECTED.  The SARS-CoV-2 RNA is generally detectable in upper respiratory specimens during the acute phase of infection. The lowest concentration of SARS-CoV-2 viral copies this assay can detect is 138 copies/mL. A negative result does not preclude SARS-Cov-2 infection and should not be used  as the sole basis for treatment or other patient management decisions. A negative result may occur with  improper specimen collection/handling, submission of specimen other than nasopharyngeal swab, presence of viral mutation(s) within the areas targeted by this assay, and inadequate number of viral copies(<138 copies/mL). A negative result must be combined with clinical observations, patient history, and epidemiological information. The expected result is Negative.  Fact Sheet for Patients:  EntrepreneurPulse.com.au  Fact Sheet for Healthcare Providers:  IncredibleEmployment.be  This test is no t yet approved or cleared by the [place] FDA and  has been authorized for detection and/or diagnosis of SARS-CoV-2 by FDA under an Emergency Use Authorization (EUA). This EUA will remain  in effect (meaning this test can be used) for the duration of the COVID-19 declaration under Section 564(b)(1) of the Act, 21 U.S.C.section 360bbb-3(b)(1), unless the authorization is terminated  or revoked sooner.       Influenza A by PCR NEGATIVE NEGATIVE Final   Influenza B by PCR NEGATIVE NEGATIVE Final    Comment: (NOTE) The Xpert Xpress SARS-CoV-2/FLU/RSV plus assay is intended as an aid in the diagnosis of influenza from Nasopharyngeal swab specimens and should  not be used as a sole basis for treatment. Nasal washings and aspirates are unacceptable for Xpert Xpress SARS-CoV-2/FLU/RSV testing.  Fact Sheet for Patients: EntrepreneurPulse.com.au  Fact Sheet for Healthcare Providers: IncredibleEmployment.be  This test is not yet approved or cleared by the [place] FDA and has been authorized for detection and/or diagnosis of SARS-CoV-2 by FDA under an Emergency Use Authorization (EUA). This EUA will remain in effect (meaning this test can be used) for the duration of the COVID-19 declaration under Section 564(b)(1) of the Act, 21 U.S.C. section 360bbb-3(b)(1), unless the authorization is terminated or revoked.  Performed at [facility], [place] [address], [place], [place] [address]   Surgical pcr screen     Status: None   Collection Time: 01/31/20 11:40 AM   Specimen: Nasal Mucosa; Nasal Swab  Result Value Ref Range Status   MRSA, PCR NEGATIVE NEGATIVE Final   Staphylococcus aureus NEGATIVE NEGATIVE Final    Comment: (NOTE) The Xpert SA Assay (FDA approved for NASAL specimens in patients 66 years of age and older), is one component of a comprehensive surveillance program. It is not intended to diagnose infection nor to guide or monitor treatment. Performed at [facility], [place] [address] [place], [place] [address]   Resp Panel by RT-PCR (Flu A&B, Covid) Nasopharyngeal Swab     Status: None   Collection Time: 02/03/20  1:33 PM   Specimen: Nasopharyngeal Swab; Nasopharyngeal(NP) swabs in vial transport medium  Result Value Ref Range Status   SARS Coronavirus 2 by RT PCR NEGATIVE NEGATIVE Final    Comment: (NOTE) SARS-CoV-2 target nucleic acids are NOT DETECTED.  The SARS-CoV-2 RNA is generally detectable in upper respiratory specimens during the acute phase of infection. The lowest concentration of SARS-CoV-2 viral copies this assay can detect is 138 copies/mL. A negative result does  not preclude SARS-Cov-2 infection and should not be used as the sole basis for treatment or other patient management decisions. A negative result may occur with  improper specimen collection/handling, submission of specimen other than nasopharyngeal swab, presence of viral mutation(s) within the areas targeted by this assay, and inadequate number of viral copies(<138 copies/mL). A negative result must be combined with clinical observations, patient history, and epidemiological information. The expected result is Negative.  Fact Sheet for Patients:  EntrepreneurPulse.com.au  Fact Sheet for Healthcare Providers:  SeriousBroker.it  This test is no t yet approved or cleared by the [place] FDA and  has been authorized for detection and/or diagnosis of SARS-CoV-2 by FDA under an Emergency Use Authorization (EUA). This EUA will remain  in effect (meaning this test can be used) for the duration of the COVID-19 declaration under Section 564(b)(1) of the Act, 21 U.S.C.section 360bbb-3(b)(1), unless the authorization is terminated  or revoked sooner.       Influenza A by PCR NEGATIVE NEGATIVE Final   Influenza B by PCR NEGATIVE NEGATIVE Final    Comment: (NOTE) The Xpert Xpress SARS-CoV-2/FLU/RSV plus assay is intended as an aid in the diagnosis of influenza from Nasopharyngeal swab specimens and should not be used as a sole basis for treatment. Nasal washings and aspirates are unacceptable for Xpert Xpress SARS-CoV-2/FLU/RSV testing.  Fact Sheet for Patients: BloggerCourse.com  Fact Sheet for Healthcare Providers: SeriousBroker.it  This test is not yet approved or cleared by the [place] FDA and has been authorized for detection and/or diagnosis of SARS-CoV-2 by FDA under an Emergency Use Authorization (EUA). This EUA will remain in effect (meaning this test can be used) for the  duration of the COVID-19 declaration under Section 564(b)(1) of the Act, 21 U.S.C. section 360bbb-3(b)(1), unless the authorization is terminated or revoked.  Performed at [facility], [address] [place], [place] [address]      Radiology Studies:  No results found.   Scheduled Meds:   . apixaban  5 mg Oral BID  . dicyclomine  20 mg Oral BID  . docusate sodium  100 mg Oral BID  . DULoxetine  60 mg Oral Daily  . gabapentin  600 mg Oral TID  . iron polysaccharides  150 mg Oral Daily  . metoprolol tartrate  25 mg Oral QHS  . metoprolol tartrate  50 mg Oral Daily  . pantoprazole  40 mg Oral Daily  . simvastatin  20 mg Oral QHS  . tamsulosin  0.4 mg Oral QHS  . traMADol  50 mg Oral Q6H  . vitamin B-12  2,500 mcg Oral Daily    Continuous Infusions:   . methocarbamol (ROBAXIN) IV       LOS: 9 days     Marcellus Scott, MD, [place], [facility]. [facility]    To contact the attending provider between 7A-7P or the covering provider during after hours 7P-7A, please log into the web site [email or web address] and access using universal [place] password for that web site. If you do not have the password, please call the hospital operator.  02/09/2020, 6:19 PM

## 2020-02-10 DIAGNOSIS — I4821 Permanent atrial fibrillation: Secondary | ICD-10-CM | POA: Diagnosis not present

## 2020-02-10 LAB — CBC
HCT: 38.7 % — ABNORMAL LOW (ref 39.0–52.0)
Hemoglobin: 12.3 g/dL — ABNORMAL LOW (ref 13.0–17.0)
MCH: 29.8 pg (ref 26.0–34.0)
MCHC: 31.8 g/dL (ref 30.0–36.0)
MCV: 93.7 fL (ref 80.0–100.0)
Platelets: 322 10*3/uL (ref 150–400)
RBC: 4.13 MIL/uL — ABNORMAL LOW (ref 4.22–5.81)
RDW: 15.1 % (ref 11.5–15.5)
WBC: 10.4 10*3/uL (ref 4.0–10.5)
nRBC: 0 % (ref 0.0–0.2)

## 2020-02-10 MED ORDER — MAGNESIUM HYDROXIDE 400 MG/5ML PO SUSP
30.0000 mL | Freq: Every day | ORAL | Status: DC | PRN
  Filled 2020-02-10: qty 30

## 2020-02-10 MED ORDER — VITAMIN B-12 1000 MCG PO TABS
1000.0000 ug | ORAL_TABLET | Freq: Every day | ORAL | Status: DC
  Administered 2020-02-11 – 2020-02-16 (×6): 1000 ug via ORAL
  Filled 2020-02-10 (×6): qty 1

## 2020-02-10 NOTE — Progress Notes (Signed)
PROGRESS NOTE   David Fitzgerald  [id number]    DOB: [birth year]    DOA: 01/31/2020  PCP: Angelina Sheriff, MD   I have briefly reviewed patients previous medical records in [facility].  Chief Complaint  Patient presents with  . Fall    Brief Narrative:  60 year old male, lives alone, IADLs, PMH of paroxysmal A. fib on Eliquis (follows with Dr. Bettina Gavia, Cardiology), type II DM with neuropathy, morbid obesity, BPH, venous insufficiency presented with a mechanical fall and open left ankle fracture.  Admitted by orthopedics and underwent ORIF of left ankle.  While in PACU, developed intermittent A. fib with RVR that responded to IV Lopressor.  TRH consulted for management.  Currently stable for DC to SNF pending bed and insurance.   Assessment & Plan:  Active Problems:   Open fracture dislocation of left ankle   Ankle fracture   Permanent atrial fibrillation (HCC)   Chronic A. fib, transiently with RVR postop Mostly rate controlled on current dose of metoprolol 50 mg daily and 25 mg at bedtime.  Has been resumed on apixaban anticoagulation.  Brief and occasional episodes of RVR earlier this morning but asymptomatic.  Continue monitoring on telemetry while here.  Outpatient follow-up with Dr. Bettina Gavia upon discharge.  Stable on telemetry-telemetry discontinued 1/6.  Open fracture of left ankle S/p ORIF.  Management per primary service.  Patient stable for DC to next level of care from a medical standpoint.  Unfortunately long wait for SNF placement like many other patients.  Lactic acidosis: Peaked to 3.9.  Unclear etiology.  May be due to volume depletion.  Resolved.  Leukocytosis Likely stress margination.  No clinical concerns for infection.  Monitoring off of antibiotics.  AKI Resolved.  OSA: Continue nightly CPAP.  Type II DM with neuropathy Metformin held.  A1c 6.  May resume Metformin at discharge.  Remains on gabapentin.  Hyperlipidemia Continue  simvastatin.  Essential hypertension Mildly uncontrolled at times.  Continue metoprolol dose as above.  BPH Continue tamsulosin.  GERD Continue pantoprazole.  Acute blood loss anemia Suspect due to surgery.  Anemia panel results reviewed.  Suspect some degree of iron deficiency.  Continue iron supplements.  Hemoglobin improved to 12.3.  B12: 1704, reduced B12 dose.  Recheck B12 levels in a couple of months  Hypokalemia Replaced.  Magnesium normal.  Left hip pain Suspect due to fall.  X-rays without fracture or dislocation.  Body mass index is 48.12 kg/m./Morbid obesity Outpatient follow-up.   DVT prophylaxis: SCDs Start: 02/01/20 0020 SCDs Start: 01/31/20 0403 patient also on apixaban   Code Status: Full Code Family Communication: None at bedside. Disposition:  Status is: Inpatient  Remains inpatient appropriate because:Unsafe d/c plan   Dispo: The patient is from: Home              Anticipated d/c is to: SNF              Anticipated d/c date is: 1 day              Patient currently is medically stable to d/c.        Consultants:   TRH are consultants.  Procedures:   As noted above.  Antimicrobials:    Anti-infectives (From admission, onward)   Start     Dose/Rate Route Frequency Ordered Stop   02/01/20 0100  ceFAZolin (ANCEF) IVPB 2g/100 mL premix        2 g 200 mL/hr over 30 Minutes Intravenous Every 8  hours 02/01/20 0019 02/01/20 1733   01/31/20 1543  vancomycin (VANCOCIN) powder  Status:  Discontinued          As needed 01/31/20 1544 01/31/20 1638   01/31/20 0430  clindamycin (CLEOCIN) IVPB 900 mg  Status:  Discontinued        900 mg 100 mL/hr over 30 Minutes Intravenous Every 8 hours 01/31/20 0408 02/01/20 0017   01/31/20 0315  ceFAZolin (ANCEF) IVPB 2g/100 mL premix        2 g 200 mL/hr over 30 Minutes Intravenous  Once 01/31/20 0308 01/31/20 0547        Subjective:  Denies complaints.  States that if he is unable to go to SNF then he would  rather go home.  Advised him that he would be at increased fall risk.  No complaints reported.  Objective:   Vitals:   02/09/20 2105 02/10/20 0527 02/10/20 0804 02/10/20 1648  BP: (!) 151/81 (!) 163/91 (!) 153/89 (!) 152/59  Pulse: 95 89 82 74  Resp: 18 18 17 18   Temp: 98.9 F (37.2 C) 97.7 F (36.5 C) 98.2 F (36.8 C) 98.6 F (37 C)  TempSrc: Oral Oral Oral Oral  SpO2: 94% 94% 91% 95%  Weight:      Height:        General exam: Pleasant middle-age male, moderately built and morbidly obese lying comfortably propped up in bed. Respiratory system: Clear to auscultation.  No increased work of breathing. Cardiovascular system: S1 and S2 heard, irregularly irregular.  No JVD, pedal edema or murmurs.  Telemetry personally reviewed: A. fib with consistently controlled ventricular rate.  No significant episodes of RVR Gastrointestinal system: Abdomen is nondistended, soft and nontender. No organomegaly or masses felt. Normal bowel sounds heard. Central nervous system: Alert and oriented. No focal neurological deficits. Extremities: Symmetric 5 x 5 power.  Left leg in postop cast. Skin: No rashes, lesions or ulcers Psychiatry: Judgement and insight appear normal. Mood & affect appropriate.     Data Reviewed:   I have personally reviewed following labs and imaging studies   CBC: Recent Labs  Lab 02/04/20 0255 02/08/20 0306 02/10/20 0205  WBC 9.7 11.3* 10.4  NEUTROABS 6.1 7.0  --   HGB 12.8* 11.8* 12.3*  HCT 40.4 36.7* 38.7*  MCV 94.4 92.7 93.7  PLT 239 283 [id number]    Basic Metabolic Panel: Recent Labs  Lab 02/04/20 0255 02/08/20 0306  NA 139 136  K 3.4* 3.6  CL 100 99  CO2 26 24  GLUCOSE 130* 130*  BUN 14 14  CREATININE 0.91 1.08  CALCIUM 8.7* 8.6*  MG 2.0 2.1  PHOS 3.6 3.5    Liver Function Tests: Recent Labs  Lab 02/04/20 0255  AST 22  ALT 21  ALKPHOS 72  BILITOT 0.8  PROT 7.4  ALBUMIN 3.2*    CBG: No results for input(s): GLUCAP in the last 168  hours.  Microbiology Studies:   Recent Results (from the past 240 hour(s))  Resp Panel by RT-PCR (Flu A&B, Covid) Nasopharyngeal Swab     Status: None   Collection Time: 02/03/20  1:33 PM   Specimen: Nasopharyngeal Swab; Nasopharyngeal(NP) swabs in vial transport medium  Result Value Ref Range Status   SARS Coronavirus 2 by RT PCR NEGATIVE NEGATIVE Final    Comment: (NOTE) SARS-CoV-2 target nucleic acids are NOT DETECTED.  The SARS-CoV-2 RNA is generally detectable in upper respiratory specimens during the acute phase of infection. The lowest concentration of SARS-CoV-2 viral  copies this assay can detect is 138 copies/mL. A negative result does not preclude SARS-Cov-2 infection and should not be used as the sole basis for treatment or other patient management decisions. A negative result may occur with  improper specimen collection/handling, submission of specimen other than nasopharyngeal swab, presence of viral mutation(s) within the areas targeted by this assay, and inadequate number of viral copies(<138 copies/mL). A negative result must be combined with clinical observations, patient history, and epidemiological information. The expected result is Negative.  Fact Sheet for Patients:  BloggerCourse.com  Fact Sheet for Healthcare Providers:  SeriousBroker.it  This test is no t yet approved or cleared by the [place] FDA and  has been authorized for detection and/or diagnosis of SARS-CoV-2 by FDA under an Emergency Use Authorization (EUA). This EUA will remain  in effect (meaning this test can be used) for the duration of the COVID-19 declaration under Section 564(b)(1) of the Act, 21 U.S.C.section 360bbb-3(b)(1), unless the authorization is terminated  or revoked sooner.       Influenza A by PCR NEGATIVE NEGATIVE Final   Influenza B by PCR NEGATIVE NEGATIVE Final    Comment: (NOTE) The Xpert Xpress  SARS-CoV-2/FLU/RSV plus assay is intended as an aid in the diagnosis of influenza from Nasopharyngeal swab specimens and should not be used as a sole basis for treatment. Nasal washings and aspirates are unacceptable for Xpert Xpress SARS-CoV-2/FLU/RSV testing.  Fact Sheet for Patients: BloggerCourse.com  Fact Sheet for Healthcare Providers: SeriousBroker.it  This test is not yet approved or cleared by the [place] FDA and has been authorized for detection and/or diagnosis of SARS-CoV-2 by FDA under an Emergency Use Authorization (EUA). This EUA will remain in effect (meaning this test can be used) for the duration of the COVID-19 declaration under Section 564(b)(1) of the Act, 21 U.S.C. section 360bbb-3(b)(1), unless the authorization is terminated or revoked.  Performed at [facility], [address] [place], [place] [address]      Radiology Studies:  No results found.   Scheduled Meds:   . apixaban  5 mg Oral BID  . dicyclomine  20 mg Oral BID  . docusate sodium  100 mg Oral BID  . DULoxetine  60 mg Oral Daily  . gabapentin  600 mg Oral TID  . iron polysaccharides  150 mg Oral Daily  . metoprolol tartrate  25 mg Oral QHS  . metoprolol tartrate  50 mg Oral Daily  . pantoprazole  40 mg Oral Daily  . simvastatin  20 mg Oral QHS  . tamsulosin  0.4 mg Oral QHS  . traMADol  50 mg Oral Q6H  . vitamin B-12  2,500 mcg Oral Daily    Continuous Infusions:   . methocarbamol (ROBAXIN) IV       LOS: 10 days     Marcellus Scott, MD, [place], [facility]. [facility]    To contact the attending provider between 7A-7P or the covering provider during after hours 7P-7A, please log into the web site [email or web address] and access using universal [place] password for that web site. If you do not have the password, please call the hospital operator.  02/10/2020, 5:23 PM

## 2020-02-10 NOTE — Clinical Social Work Note (Signed)
Facility search continues and clinicals sent to [facility] (fax number [phone]) on 02/09/20, after contact with [facility] in admissions. CSW will follow up with this facility and continue SNF search.   Genelle Bal, MSW, LCSW Licensed Clinical Social Worker Clinical Social Work Department [facility] [phone]

## 2020-02-10 NOTE — Care Management Important Message (Signed)
Important Message  Patient Details  Name: David Fitzgerald MRN: [id number] Date of Birth: 07/05/1960   Medicare Important Message Given:  Yes     [name] [name] 02/10/2020, 11:41 AM

## 2020-02-10 NOTE — Progress Notes (Signed)
Physical Therapy Treatment Patient Details Name: MARZELL ISAKSON MRN: [id number] DOB: Apr 27, 1960 Today's Date: 02/10/2020    History of Present Illness FOY VANDUYNE is a 60 y.o. male with medical history significant of paroxysmal A. fib on Eliquis, IIDM, diabetic neuropathy, morbid obesity, BPH, venous insufficiency on as needed Lasix, presented with mechanical fall and left ankle fracture. Now s/p ORIF, NWB. Plan for x-ray 12/31.    PT Comments    Patient progressing towards physical therapy goals. Patient ambulated 25' with min guard and RW. Performed functional exercises in sitting and supine focusing on B LE strengthening. Patient continues to be limited by decreased activity tolerance, impaired balance, generalized weakness, and impaired functional mobility. Continue to recommend SNF for ongoing Physical Therapy.       Follow Up Recommendations  SNF;Supervision/Assistance - 24 hour     Equipment Recommendations  Rolling Kang Ishida with 5" wheels;3in1 (PT);Wheelchair (measurements PT);Wheelchair cushion (measurements PT)    Recommendations for Other Services       Precautions / Restrictions Precautions Precautions: Fall Precaution Comments: watch HR Restrictions Weight Bearing Restrictions: Yes LLE Weight Bearing: Non weight bearing    Mobility  Bed Mobility Overal bed mobility: Modified Independent                Transfers Overall transfer level: Needs assistance Equipment used: Rolling Emerie Vanderkolk (2 wheeled) Transfers: Sit to/from Stand Sit to Stand: Min guard         General transfer comment: min guard for safety, able to maintain NWB on L during standing  Ambulation/Gait Ambulation/Gait assistance: Min guard Gait Distance (Feet): 25 Feet Assistive device: Rolling Ianmichael Amescua (2 wheeled) Gait Pattern/deviations: Step-to pattern Gait velocity: decreased   General Gait Details: good compliance with NWB status during gait   Stairs             Wheelchair  Mobility    Modified Rankin (Stroke Patients Only)       Balance Overall balance assessment: Needs assistance Sitting-balance support: No upper extremity supported;Feet supported Sitting balance-[name]: Good     Standing balance support: Bilateral upper extremity supported;During functional activity Standing balance-[name]: Poor Standing balance comment: reliant on external support due to NWB                            Cognition Arousal/Alertness: Awake/alert Behavior During Therapy: WFL for tasks assessed/performed Overall Cognitive Status: Within Functional Limits for tasks assessed                                        Exercises General Exercises - Lower Extremity Long Arc Quad: AROM;Both;10 reps;Seated Heel Slides: Both;10 reps;Supine Hip ABduction/ADduction: Both;10 reps;Supine Straight Leg Raises: Both;10 reps;Supine Hip Flexion/Marching: Both;10 reps;Seated    General Comments        Pertinent Vitals/Pain Pain Assessment: No/denies pain    Home Living                      Prior Function            PT Goals (current goals can now be found in the care plan section) Acute Rehab PT Goals Patient Stated Goal: Wants to be as independent as possible when he gets home PT Goal Formulation: With patient Time For Goal Achievement: 02/15/20 Potential to Achieve Goals: Good Progress towards PT goals: Progressing toward goals  Frequency    Min 2X/week      PT Plan Current plan remains appropriate    Co-evaluation              AM-PAC PT "6 Clicks" Mobility   Outcome Measure  Help needed turning from your back to your side while in a flat bed without using bedrails?: None Help needed moving from lying on your back to sitting on the side of a flat bed without using bedrails?: None Help needed moving to and from a bed to a chair (including a wheelchair)?: A Little Help needed standing up from a chair  using your arms (e.g., wheelchair or bedside chair)?: A Little Help needed to walk in hospital room?: A Little Help needed climbing 3-5 steps with a railing? : Total 6 Click Score: 18    End of Session Equipment Utilized During Treatment: Gait belt Activity Tolerance: Patient tolerated treatment well Patient left: in bed;with call bell/phone within reach Nurse Communication: Mobility status PT Visit Diagnosis: Unsteadiness on feet (R26.81);Other abnormalities of gait and mobility (R26.89)     Time: [id number] PT Time Calculation (min) (ACUTE ONLY): 18 min  Charges:  $Therapeutic Activity: 8-22 mins                     Kainat Pizana A. Gilford Rile PT, DPT Acute Rehabilitation Services Pager [phone] Office [phone]    Alda Lea 02/10/2020, 3:40 PM

## 2020-02-11 DIAGNOSIS — I4821 Permanent atrial fibrillation: Secondary | ICD-10-CM | POA: Diagnosis not present

## 2020-02-11 MED ORDER — METOPROLOL TARTRATE 50 MG PO TABS: 50.0000 mg | ORAL_TABLET | Freq: Every day | ORAL | 1 refills | Status: DC

## 2020-02-11 MED ORDER — CYANOCOBALAMIN 1000 MCG PO TABS: 1000.0000 ug | ORAL_TABLET | Freq: Every day | ORAL | 1 refills | Status: DC

## 2020-02-11 MED ORDER — METOPROLOL TARTRATE 25 MG PO TABS: 25.0000 mg | ORAL_TABLET | Freq: Every day | ORAL | 1 refills | Status: DC

## 2020-02-11 NOTE — Plan of Care (Signed)
  Problem: Education: Goal: Knowledge of General Education information will improve Description Including pain rating scale, medication(s)/side effects and non-pharmacologic comfort measures Outcome: Progressing   Problem: Health Behavior/Discharge Planning: Goal: Ability to manage health-related needs will improve Outcome: Progressing   

## 2020-02-11 NOTE — Plan of Care (Signed)
Patient is s/p ORIF to left ankle on 01/31/20. Patient is stable for discharge at this time just waiting for SNF placement. Patient is not opposed to just going home and receiving home health care either. Will pass along to day shift nurse. No complaints of pain or needs voiced. Will continue to monitor and continue current POC.

## 2020-02-11 NOTE — Progress Notes (Signed)
PROGRESS NOTE   David Fitzgerald  UXL:[id number]    DOB: 28-Mar-1960    DOA: 01/31/2020  PCP: Angelina Sheriff, MD   I have briefly reviewed patients previous medical records in [facility].  Chief Complaint  Patient presents with  . Fall    Brief Narrative:  60 year old male, lives alone, IADLs, PMH of paroxysmal A. fib on Eliquis (follows with Dr. Bettina Gavia, Cardiology), type II DM with neuropathy, morbid obesity, BPH, venous insufficiency presented with a mechanical fall and open left ankle fracture.  Admitted by orthopedics and underwent ORIF of left ankle.  While in PACU, developed intermittent A. fib with RVR that responded to IV Lopressor.  TRH consulted for management.  Currently stable for DC to SNF pending bed and insurance.  Patient now has been waiting for several days for DC to SNF.  Advised him that given the nonweightbearing status on his left lower extremity and that he lives alone, he will be at high fall risk with related complications if he returns home.  He verbalizes understanding.  Patient is stable from a medical standpoint for DC.  I have updated PCP and Cardiology follow-up's in AVS.  I have also printed prescriptions for changed medications (metoprolol and vitamin B12 supplements) and completed medical part of the discharge meds.  TRH will sign off at this time.  Kindly consult TRH again for any further assistance.   Assessment & Plan:  Active Problems:   Open fracture dislocation of left ankle   Ankle fracture   Permanent atrial fibrillation (HCC)   Chronic A. fib, transiently with RVR postop Mostly rate controlled on current dose of metoprolol 50 mg daily and 25 mg at bedtime.  Has been resumed on apixaban anticoagulation. Stable.  Outpatient follow-up with Dr. Bettina Gavia upon discharge.  Minimize use of NSAIDs while on anticoagulation to avoid risks of stomach ulcers and related bleeding.  Open fracture of left ankle S/p ORIF.  Management per primary  service.  Patient stable for DC to next level of care from a medical standpoint.  Unfortunately long wait for SNF placement like many other patients.  Lactic acidosis: Peaked to 3.9.  Unclear etiology.  May be due to volume depletion.  Resolved.  Leukocytosis Likely stress margination.  No clinical concerns for infection.  Monitoring off of antibiotics.  AKI Resolved.  OSA: Continue nightly CPAP.  Type II DM with neuropathy Metformin held.  A1c 6.  May resume Metformin at discharge.  Remains on gabapentin.  Hyperlipidemia Continue simvastatin.  Essential hypertension Mildly uncontrolled at times.  Continue metoprolol dose as above.  BPH Continue tamsulosin.  GERD Continue pantoprazole.  Acute blood loss anemia Suspect due to surgery.  Anemia panel results reviewed.  Suspect some degree of iron deficiency.  Continue iron supplements.  Hemoglobin improved to 12.3.  B12: 1704, reduced B12 dose.  Recheck B12 levels in a couple of months  Hypokalemia Replaced.  Magnesium normal.  Left hip pain Suspect due to fall.  X-rays without fracture or dislocation.  Body mass index is 48.12 kg/m./Morbid obesity Outpatient follow-up.   DVT prophylaxis: SCDs Start: 02/01/20 0020 SCDs Start: 01/31/20 0403 patient also on apixaban   Code Status: Full Code Family Communication: None at bedside. Disposition:  Status is: Inpatient  Remains inpatient appropriate because:Unsafe d/c plan   Dispo: The patient is from: Home              Anticipated d/c is to: SNF  Anticipated d/c date is: 1 day              Patient currently is medically stable to d/c.        Consultants:   TRH are consultants.  Procedures:   As noted above.  Antimicrobials:    Anti-infectives (From admission, onward)   Start     Dose/Rate Route Frequency Ordered Stop   02/01/20 0100  ceFAZolin (ANCEF) IVPB 2g/100 mL premix        2 g 200 mL/hr over 30 Minutes Intravenous Every 8 hours  02/01/20 0019 02/01/20 1733   01/31/20 1543  vancomycin (VANCOCIN) powder  Status:  Discontinued          As needed 01/31/20 1544 01/31/20 1638   01/31/20 0430  clindamycin (CLEOCIN) IVPB 900 mg  Status:  Discontinued        900 mg 100 mL/hr over 30 Minutes Intravenous Every 8 hours 01/31/20 0408 02/01/20 0017   01/31/20 0315  ceFAZolin (ANCEF) IVPB 2g/100 mL premix        2 g 200 mL/hr over 30 Minutes Intravenous  Once 01/31/20 0308 01/31/20 0547        Subjective:  No new complaints.  No pain reported  Objective:   Vitals:   02/10/20 1648 02/10/20 2020 02/11/20 0436 02/11/20 0824  BP: (!) 152/59 (!) 148/82 (!) 148/90 (!) 171/94  Pulse: 74 79 70 77  Resp: 18 18 18 17   Temp: 98.6 F (37 C) 98.4 F (36.9 C) (!) 97.3 F (36.3 C) 98.1 F (36.7 C)  TempSrc: Oral Oral Oral Oral  SpO2: 95% 94% 94% 98%  Weight:      Height:        General exam: Pleasant middle-age male, moderately built and morbidly obese lying comfortably propped up in bed. Respiratory system: Clear to auscultation.  No increased work of breathing. Cardiovascular system: S1 and S2 heard, irregularly irregular.  No JVD, murmurs or pedal edema. Gastrointestinal system: Abdomen is nondistended, soft and nontender. No organomegaly or masses felt. Normal bowel sounds heard. Central nervous system: Alert and oriented. No focal neurological deficits. Extremities: Symmetric 5 x 5 power.  Left leg in postop cast. Skin: No rashes, lesions or ulcers Psychiatry: Judgement and insight appear normal. Mood & affect appropriate.     Data Reviewed:   I have personally reviewed following labs and imaging studies   CBC: Recent Labs  Lab 02/08/20 0306 02/10/20 0205  WBC 11.3* 10.4  NEUTROABS 7.0  --   HGB 11.8* 12.3*  HCT 36.7* 38.7*  MCV 92.7 93.7  PLT 283 [id number]    Basic Metabolic Panel: Recent Labs  Lab 02/08/20 0306  NA 136  K 3.6  CL 99  CO2 24  GLUCOSE 130*  BUN 14  CREATININE 1.08  CALCIUM 8.6*  MG  2.1  PHOS 3.5    Liver Function Tests: No results for input(s): AST, ALT, ALKPHOS, BILITOT, PROT, ALBUMIN in the last 168 hours.  CBG: No results for input(s): GLUCAP in the last 168 hours.  Microbiology Studies:   Recent Results (from the past 240 hour(s))  Resp Panel by RT-PCR (Flu A&B, Covid) Nasopharyngeal Swab     Status: None   Collection Time: 02/03/20  1:33 PM   Specimen: Nasopharyngeal Swab; Nasopharyngeal(NP) swabs in vial transport medium  Result Value Ref Range Status   SARS Coronavirus 2 by RT PCR NEGATIVE NEGATIVE Final    Comment: (NOTE) SARS-CoV-2 target nucleic acids are NOT DETECTED.  The  SARS-CoV-2 RNA is generally detectable in upper respiratory specimens during the acute phase of infection. The lowest concentration of SARS-CoV-2 viral copies this assay can detect is 138 copies/mL. A negative result does not preclude SARS-Cov-2 infection and should not be used as the sole basis for treatment or other patient management decisions. A negative result may occur with  improper specimen collection/handling, submission of specimen other than nasopharyngeal swab, presence of viral mutation(s) within the areas targeted by this assay, and inadequate number of viral copies(<138 copies/mL). A negative result must be combined with clinical observations, patient history, and epidemiological information. The expected result is Negative.  Fact Sheet for Patients:  EntrepreneurPulse.com.au  Fact Sheet for Healthcare Providers:  IncredibleEmployment.be  This test is no t yet approved or cleared by the [place] FDA and  has been authorized for detection and/or diagnosis of SARS-CoV-2 by FDA under an Emergency Use Authorization (EUA). This EUA will remain  in effect (meaning this test can be used) for the duration of the COVID-19 declaration under Section 564(b)(1) of the Act, 21 U.S.C.section 360bbb-3(b)(1), unless the  authorization is terminated  or revoked sooner.       Influenza A by PCR NEGATIVE NEGATIVE Final   Influenza B by PCR NEGATIVE NEGATIVE Final    Comment: (NOTE) The Xpert Xpress SARS-CoV-2/FLU/RSV plus assay is intended as an aid in the diagnosis of influenza from Nasopharyngeal swab specimens and should not be used as a sole basis for treatment. Nasal washings and aspirates are unacceptable for Xpert Xpress SARS-CoV-2/FLU/RSV testing.  Fact Sheet for Patients: EntrepreneurPulse.com.au  Fact Sheet for Healthcare Providers: IncredibleEmployment.be  This test is not yet approved or cleared by the [place] FDA and has been authorized for detection and/or diagnosis of SARS-CoV-2 by FDA under an Emergency Use Authorization (EUA). This EUA will remain in effect (meaning this test can be used) for the duration of the COVID-19 declaration under Section 564(b)(1) of the Act, 21 U.S.C. section 360bbb-3(b)(1), unless the authorization is terminated or revoked.  Performed at [facility], [place] [address] [place], [place] [address]      Radiology Studies:  No results found.   Scheduled Meds:   . apixaban  5 mg Oral BID  . dicyclomine  20 mg Oral BID  . docusate sodium  100 mg Oral BID  . DULoxetine  60 mg Oral Daily  . gabapentin  600 mg Oral TID  . iron polysaccharides  150 mg Oral Daily  . metoprolol tartrate  25 mg Oral QHS  . metoprolol tartrate  50 mg Oral Daily  . pantoprazole  40 mg Oral Daily  . simvastatin  20 mg Oral QHS  . tamsulosin  0.4 mg Oral QHS  . traMADol  50 mg Oral Q6H  . vitamin B-12  1,000 mcg Oral Daily    Continuous Infusions:   . methocarbamol (ROBAXIN) IV       LOS: 11 days     Vernell Leep, MD, [place], [facility]    To contact the attending provider between 7A-7P or the covering provider during after hours 7P-7A, please log into the web site [email or web address] and access using  universal [place] password for that web site. If you do not have the password, please call the hospital operator.  02/11/2020, 1:07 PM

## 2020-02-13 NOTE — TOC Progression Note (Signed)
Transition of Care [facility]) - Progression Note    Patient Details  Name: David Fitzgerald MRN: [id number] Date of Birth: 27-Sep-1960  Transition of Care [facility]) CM/SW [place], [place] Phone Number: 02/13/2020, 4:43 PM  Clinical Narrative:     Patient has decided on [place] will follow up with SNF   TOC will continue to follow and assist with discharge planning,  Thurmond Butts, MSW, LCSW Clinical Social Worker    Expected Discharge Plan: [place] Barriers to Discharge: Continued Medical Work up  Expected Discharge Plan and Services Expected Discharge Plan: [place]   Discharge Planning Services: CM Consult Post Acute Care Choice: [place] Living arrangements for the past 2 months: Single Family Home                                       Social Determinants of Health (SDOH) Interventions    Readmission Risk Interventions Readmission Risk Prevention Plan 02/02/2020  Post Dischage Appt Complete  Medication Screening Complete  Transportation Screening Complete  Some recent data might be hidden

## 2020-02-13 NOTE — TOC Progression Note (Signed)
Transition of Care [facility]) - Progression Note    Patient Details  Name: David Fitzgerald MRN: [id number] Date of Birth: 28-Jun-1960  Transition of Care [facility]) CM/SW [place], [place] Phone Number: 02/13/2020, 12:53 PM  Clinical Narrative:     CSW visit with patient at bedside. CSW provided patient w/bed offers. CSW explained no indication of bed offer from [facility]. Patient chose [facility] in [place] but wants to discuss with his mother before confirming. CSW will follow up later today for final decision.  Thurmond Butts, MSW, LCSW Clinical Social Worker   Expected Discharge Plan: Skilled Nursing Facility Barriers to Discharge: Continued Medical Work up  Expected Discharge Plan and Services Expected Discharge Plan: [place]   Discharge Planning Services: CM Consult Post Acute Care Choice: [place] Living arrangements for the past 2 months: Single Family Home                                       Social Determinants of Health (SDOH) Interventions    Readmission Risk Interventions Readmission Risk Prevention Plan 02/02/2020  Post Dischage Appt Complete  Medication Screening Complete  Transportation Screening Complete  Some recent data might be hidden

## 2020-02-14 ENCOUNTER — Inpatient Hospital Stay (HOSPITAL_COMMUNITY): Payer: Medicare HMO

## 2020-02-14 LAB — SARS CORONAVIRUS 2 (TAT 6-24 HRS): SARS Coronavirus 2: NEGATIVE

## 2020-02-14 NOTE — TOC Progression Note (Addendum)
Transition of Care [facility]) - Progression Note    Patient Details  Name: David Fitzgerald MRN: [id number] Date of Birth: [birth year]  Transition of Care [facility]) CM/SW [place], [place] Phone Number: 02/14/2020, 11:54 AM  Clinical Narrative:    CSW confirmed with Navi that [facility] manages this pt and facility will need to start auth. CSW contacted [facility] to advise them of this and confirm bed offer, VM was left. SW will continue to follow for DC planning.  [place] confirmed the bed offer and is starting insurance auth. CSW requested covid and asked for PT to see pt again for insurance. CSW will follow.  Expected Discharge Plan: [place] Barriers to Discharge: Continued Medical Work up  Expected Discharge Plan and Services Expected Discharge Plan: [place]   Discharge Planning Services: CM Consult Post Acute Care Choice: [place] Living arrangements for the past 2 months: Single Family Home                                       Social Determinants of Health (SDOH) Interventions    Readmission Risk Interventions Readmission Risk Prevention Plan 02/02/2020  Post Dischage Appt Complete  Medication Screening Complete  Transportation Screening Complete  Some recent data might be hidden

## 2020-02-14 NOTE — Plan of Care (Signed)
  Problem: Health Behavior/Discharge Planning: Goal: Ability to manage health-related needs will improve Outcome: Progressing   Problem: Activity: Goal: Risk for activity intolerance will decrease Outcome: Progressing   Problem: Pain Managment: Goal: General experience of comfort will improve Outcome: Progressing   

## 2020-02-14 NOTE — Plan of Care (Signed)

## 2020-02-14 NOTE — Care Management Important Message (Signed)
Important Message  Patient Details  Name: David Fitzgerald MRN: [id number] Date of Birth: 04/11/1960   Medicare Important Message Given:  Yes     Johnpaul Gillentine P Alyaan Budzynski 02/14/2020, 2:23 PM

## 2020-02-14 NOTE — Plan of Care (Signed)

## 2020-02-15 ENCOUNTER — Encounter (HOSPITAL_COMMUNITY): Payer: Self-pay | Admitting: Internal Medicine

## 2020-02-15 DIAGNOSIS — F1190 Opioid use, unspecified, uncomplicated: Secondary | ICD-10-CM

## 2020-02-15 HISTORY — DX: Opioid use, unspecified, uncomplicated: F11.90

## 2020-02-15 MED ORDER — ASCORBIC ACID 500 MG PO TABS
1000.0000 mg | ORAL_TABLET | Freq: Every day | ORAL | Status: DC
  Administered 2020-02-15 – 2020-02-16 (×2): 1000 mg via ORAL
  Filled 2020-02-15 (×2): qty 2

## 2020-02-15 MED ORDER — VITAMIN D 125 MCG (5000 UT) PO CAPS: 1.0000 | ORAL_CAPSULE | Freq: Every day | ORAL | 6 refills | Status: DC

## 2020-02-15 MED ORDER — VITAMIN D 25 MCG (1000 UNIT) PO TABS
2000.0000 [IU] | ORAL_TABLET | Freq: Two times a day (BID) | ORAL | Status: DC
  Administered 2020-02-15 – 2020-02-16 (×3): 2000 [IU] via ORAL
  Filled 2020-02-15 (×3): qty 2

## 2020-02-15 MED ORDER — ASCORBIC ACID 1000 MG PO TABS: 1000.0000 mg | ORAL_TABLET | Freq: Every day | ORAL | 1 refills | Status: AC

## 2020-02-15 MED ORDER — DOCUSATE SODIUM 100 MG PO CAPS: 100.0000 mg | ORAL_CAPSULE | Freq: Two times a day (BID) | ORAL | 0 refills | Status: DC

## 2020-02-15 MED ORDER — TRAMADOL HCL 50 MG PO TABS: 50.0000 mg | ORAL_TABLET | Freq: Four times a day (QID) | ORAL | 0 refills | Status: DC | PRN

## 2020-02-15 MED ORDER — ACETAMINOPHEN 325 MG PO TABS: 325.0000 mg | ORAL_TABLET | Freq: Four times a day (QID) | ORAL | 0 refills | Status: DC | PRN

## 2020-02-15 NOTE — Progress Notes (Signed)
Orthopedic Tech Progress Note Patient Details:  David Fitzgerald 10-Dec-1960 [id number] Removed splint and applied cast per PA Eddie Dibbles request. Casting Type of Cast: Short leg cast Cast Location: RLE Cast Material: Fiberglass Cast Intervention: Application  Post Interventions Patient Tolerated: Well Instructions Provided: Care of device,Adjustment of device     Jarad Barth A Yajahira Tison 02/15/2020, 11:27 AM

## 2020-02-15 NOTE — TOC Progression Note (Signed)
Transition of Care [facility]) - Progression Note    Patient Details  Name: David Fitzgerald MRN: [id number] Date of Birth: 10/06/1960  Transition of Care [facility]) CM/SW [place], [place] Phone Number: 02/15/2020, 12:37 PM  Clinical Narrative:    CSW confirmed with [facility] that insurance is still pending, they need updated PT OT notes to complete it. CSW requested new evals from the dr. Titus Dubin will continue to follow for DC planning.   Expected Discharge Plan: [place] Barriers to Discharge: Continued Medical Work up  Expected Discharge Plan and Services Expected Discharge Plan: [place]   Discharge Planning Services: CM Consult Post Acute Care Choice: [place] Living arrangements for the past 2 months: Single Family Home                                       Social Determinants of Health (SDOH) Interventions    Readmission Risk Interventions Readmission Risk Prevention Plan 02/02/2020  Post Dischage Appt Complete  Medication Screening Complete  Transportation Screening Complete  Some recent data might be hidden

## 2020-02-15 NOTE — Plan of Care (Signed)
  Problem: Activity: Goal: Risk for activity intolerance will decrease Outcome: Adequate for Discharge   Problem: Pain Managment: Goal: General experience of comfort will improve Outcome: Adequate for Discharge   Problem: Safety: Goal: Ability to remain free from injury will improve Outcome: Adequate for Discharge   

## 2020-02-15 NOTE — Progress Notes (Signed)
Physical Therapy Treatment Patient Details Name: David Fitzgerald MRN: [id number] DOB: February 14, 1960 Today's Date: 02/15/2020    History of Present Illness David Fitzgerald is a 60 y.o. male with medical history significant of paroxysmal A. fib on Eliquis, IIDM, diabetic neuropathy, morbid obesity, BPH, venous insufficiency on as needed Lasix, presented with mechanical fall and left ankle fracture. Now s/p ORIF, NWB. Plan for x-ray 12/31.    PT Comments    Pt fully participated in session. Pt requiring assist to maintain balance and weight bearing restrictions. Pt with no pain during session. Pt lives alone and continue to require assist. Pt will benefit from skilled PT to address deficits in balance, strength, coordination, gait and endurance to maximize independence with functional mobility prior to discharge.     Follow Up Recommendations  SNF;Supervision/Assistance - 24 hour     Equipment Recommendations  Rolling walker with 5" wheels;3in1 (PT);Wheelchair (measurements PT);Wheelchair cushion (measurements PT)    Recommendations for Other Services       Precautions / Restrictions Precautions Precautions: Fall Precaution Comments: watch HR Restrictions Weight Bearing Restrictions: Yes LLE Weight Bearing: Non weight bearing    Mobility  Bed Mobility                  Transfers Overall transfer level: Needs assistance Equipment used: Rolling walker (2 wheeled) Transfers: Sit to/from Stand Sit to Stand: Min guard            Ambulation/Gait Ambulation/Gait assistance: Min guard Gait Distance (Feet): 15 Feet Assistive device: Rolling walker (2 wheeled)   Gait velocity: decreased   General Gait Details: good compliance with NWB status during gait, pt given cueing to slow down on turns to improve safety and decrease risk of falling   Stairs             Wheelchair Mobility    Modified Rankin (Stroke Patients Only)       Balance Overall balance  assessment: Needs assistance Sitting-balance support: No upper extremity supported;Feet supported Sitting balance-[name]: Good     Standing balance support: Bilateral upper extremity supported;During functional activity Standing balance-[name]: Poor                              Cognition Arousal/Alertness: Awake/alert Behavior During Therapy: WFL for tasks assessed/performed Overall Cognitive Status: Within Functional Limits for tasks assessed                                        Exercises General Exercises - Lower Extremity Long Arc Quad: AROM;10 reps;Seated;Left Heel Slides: 10 reps;Supine;Left;AROM Hip ABduction/ADduction: 10 reps;Supine;AROM;Left Straight Leg Raises: 10 reps;Supine;AROM;Left Hip Flexion/Marching: 10 reps;Seated;AROM;Left    General Comments        Pertinent Vitals/Pain      Home Living                      Prior Function            PT Goals (current goals can now be found in the care plan section) Acute Rehab PT Goals Patient Stated Goal: Wants to be as independent as possible when he gets home PT Goal Formulation: With patient Time For Goal Achievement: 02/22/20 Potential to Achieve Goals: Good Progress towards PT goals: Progressing toward goals    Frequency    Min 2X/week  PT Plan Current plan remains appropriate    Co-evaluation              AM-PAC PT "6 Clicks" Mobility   Outcome Measure  Help needed turning from your back to your side while in a flat bed without using bedrails?: None Help needed moving from lying on your back to sitting on the side of a flat bed without using bedrails?: None Help needed moving to and from a bed to a chair (including a wheelchair)?: A Little Help needed standing up from a chair using your arms (e.g., wheelchair or bedside chair)?: A Little Help needed to walk in hospital room?: A Little Help needed climbing 3-5 steps with a railing? :  Total 6 Click Score: 18    End of Session Equipment Utilized During Treatment: Gait belt Activity Tolerance: Patient tolerated treatment well Patient left: in bed;with call bell/phone within reach;with family/visitor present Nurse Communication: Mobility status PT Visit Diagnosis: Unsteadiness on feet (R26.81);Other abnormalities of gait and mobility (R26.89)     Time: 4081-4481 PT Time Calculation (min) (ACUTE ONLY): 19 min  Charges:  $Therapeutic Exercise: 8-22 mins                     Lyanne Co, DPT Acute Rehabilitation Services [phone]   [place] 02/15/2020, 12:33 PM

## 2020-02-15 NOTE — Progress Notes (Signed)
Orthopaedic Trauma Service Progress Note  Patient ID: David Fitzgerald MRN: [id number] DOB/AGE: July 20, 1960 60 y.o.  Subjective:  Doing very well No complaints Ready to go to snf  Pain controlled with low dose tramadol   Baseline neuropathy B LEx   ROS As above  Objective:   VITALS:   Vitals:   02/14/20 0746 02/14/20 2017 02/15/20 0538 02/15/20 0839  BP: (!) 123/93 (!) 152/81 133/63 (!) 150/70  Pulse: 81 90 84 76  Resp: 18 19 18 17   Temp: 98 F (36.7 C) 97.6 F (36.4 C) 97.9 F (36.6 C) 98.4 F (36.9 C)  TempSrc: Oral Oral Oral Oral  SpO2: 95% 92% 94% 93%  Weight:      Height:        Estimated body mass index is 48.12 kg/m as calculated from the following:   Height as of this encounter: 6\' 2"  (1.88 m).   Weight as of this encounter: 170 kg.   Intake/Output      01/10 0701 01/11 0700 01/11 0701 01/12 0700   P.O. 1080    Total Intake(mL/kg) 1080 (6.4)    Urine (mL/kg/hr) 1700 (0.4) 650 (0.7)   Total Output 1700 650   Net -620 -650          LABS  Results for orders placed or performed during the hospital encounter of 01/31/20 (from the past 24 hour(s))  SARS CORONAVIRUS 2 (TAT 6-24 HRS) Nasopharyngeal Nasopharyngeal Swab     Status: None   Collection Time: 02/14/20 12:27 PM   Specimen: Nasopharyngeal Swab  Result Value Ref Range   SARS Coronavirus 2 NEGATIVE NEGATIVE     PHYSICAL EXAM:    Gen: resting comfortably in bed, NAD, pleasant Lungs: unlabored  Cardiac: Reg Ext:       Left Lower extremity   Splint removed  Medial ankle traumatic wound stable, no signs of infection   Other wounds stable  Pins plantar aspect of L heel stable  Motor and sensory functions at baseline  Swelling stable   SLC applied   Assessment/Plan: 15 Days Post-Op   Principal Problem:   Open fracture dislocation of left ankle Active Problems:   OSA treated with BiPAP   Type 2 diabetes  mellitus with diabetic neuropathy (HCC)   Chronic venous insufficiency   Anxiety   CAD in native artery   Chronic anticoagulation   Morbid obesity with BMI of 45.0-49.9, adult (HCC)   Hypertension   Ankle fracture   Permanent atrial fibrillation ([employer])   Anti-infectives (From admission, onward)   Start     Dose/Rate Route Frequency Ordered Stop   02/01/20 0100  ceFAZolin (ANCEF) IVPB 2g/100 mL premix        2 g 200 mL/hr over 30 Minutes Intravenous Every 8 hours 02/01/20 0019 02/01/20 1733   01/31/20 1543  vancomycin (VANCOCIN) powder  Status:  Discontinued          As needed 01/31/20 1544 01/31/20 1638   01/31/20 0430  clindamycin (CLEOCIN) IVPB 900 mg  Status:  Discontinued        900 mg 100 mL/hr over 30 Minutes Intravenous Every 8 hours 01/31/20 0408 02/01/20 0017   01/31/20 0315  ceFAZolin (ANCEF) IVPB 2g/100 mL premix        2 g 200 mL/hr over 30 Minutes Intravenous  Once 01/31/20 0308 01/31/20 0547    .  POD/HD#: 30  60 year old male with complex medical history including sensory neuropathy bilateral lower extremities, chronic anticoagulation s/p ground-level fall with open left ankle fracture dislocation  -Open left ankle fracture dislocation including syndesmotic disruption s/p irrigation debridement and ORIF  Nonweightbearing 8 weeks  Splint removed and dressing changed.  Placed into short leg cast  Will remain casted for another 2 weeks with possible conversion to cam boot.  Tibial talocalcaneal pins will remain in place for a total of 6 weeks  Toe and knee motion okay  Continue with therapies   Awaiting for insurance authorization for SNF  - Pain management:  Pain is very well controlled  Low-dose tramadol  Continue with multimodal analgesia  - ABL anemia/Hemodynamics  Stable  - Medical issues   Home medications  - DVT/PE prophylaxis:  Home Eliquis restarted  - ID:   Antibiotics completed for open fracture treatment  - Metabolic Bone  Disease:  Medical history and fracture pattern suggestive of osteoporosis  Meets criteria for fracture liaison consultation.  Order placed  Supplement vitamin D   - FEN/GI prophylaxis/Foley/Lines:  Carb diet  -Ex-fix/Splint care:  Short leg cast  Do not let cast get wet   Will order a cast shoe  - Impediments to fracture healing:  Open fracture  Diabetes  Neuropathy  - Dispo:  Stable for skilled nursing facility transfer  Awaiting insurance authorization    Jari Pigg, Hershal Coria [phone] (C) 02/15/2020, 12:13 PM  [facility] [place] [address] [phone] Domingo Sep (F)    After 5pm and on the weekends please log on to Amion, go to orthopaedics and the look under the [facility] Call for the provider(s) on call. You can also call our office at [phone] and then follow the prompts to be connected to the call team.

## 2020-02-16 ENCOUNTER — Encounter (HOSPITAL_COMMUNITY): Payer: Self-pay | Admitting: Internal Medicine

## 2020-02-16 DIAGNOSIS — I10 Essential (primary) hypertension: Secondary | ICD-10-CM | POA: Diagnosis not present

## 2020-02-16 DIAGNOSIS — E1140 Type 2 diabetes mellitus with diabetic neuropathy, unspecified: Secondary | ICD-10-CM | POA: Diagnosis not present

## 2020-02-16 DIAGNOSIS — M1990 Unspecified osteoarthritis, unspecified site: Secondary | ICD-10-CM | POA: Diagnosis present

## 2020-02-16 DIAGNOSIS — S9306XA Dislocation of unspecified ankle joint, initial encounter: Secondary | ICD-10-CM | POA: Insufficient documentation

## 2020-02-16 DIAGNOSIS — E782 Mixed hyperlipidemia: Secondary | ICD-10-CM | POA: Diagnosis present

## 2020-02-16 DIAGNOSIS — S82852E Displaced trimalleolar fracture of left lower leg, subsequent encounter for open fracture type I or II with routine healing: Secondary | ICD-10-CM | POA: Diagnosis not present

## 2020-02-16 DIAGNOSIS — Z8249 Family history of ischemic heart disease and other diseases of the circulatory system: Secondary | ICD-10-CM | POA: Diagnosis not present

## 2020-02-16 DIAGNOSIS — I2510 Atherosclerotic heart disease of native coronary artery without angina pectoris: Secondary | ICD-10-CM | POA: Insufficient documentation

## 2020-02-16 DIAGNOSIS — R58 Hemorrhage, not elsewhere classified: Secondary | ICD-10-CM | POA: Diagnosis not present

## 2020-02-16 DIAGNOSIS — M069 Rheumatoid arthritis, unspecified: Secondary | ICD-10-CM | POA: Insufficient documentation

## 2020-02-16 DIAGNOSIS — Z4789 Encounter for other orthopedic aftercare: Secondary | ICD-10-CM | POA: Diagnosis not present

## 2020-02-16 DIAGNOSIS — E8889 Other specified metabolic disorders: Secondary | ICD-10-CM | POA: Diagnosis present

## 2020-02-16 DIAGNOSIS — T63441D Toxic effect of venom of bees, accidental (unintentional), subsequent encounter: Secondary | ICD-10-CM | POA: Diagnosis not present

## 2020-02-16 DIAGNOSIS — T8133XA Disruption of traumatic injury wound repair, initial encounter: Secondary | ICD-10-CM | POA: Diagnosis not present

## 2020-02-16 DIAGNOSIS — S82852B Displaced trimalleolar fracture of left lower leg, initial encounter for open fracture type I or II: Secondary | ICD-10-CM | POA: Diagnosis not present

## 2020-02-16 DIAGNOSIS — L97328 Non-pressure chronic ulcer of left ankle with other specified severity: Secondary | ICD-10-CM | POA: Diagnosis not present

## 2020-02-16 DIAGNOSIS — M869 Osteomyelitis, unspecified: Secondary | ICD-10-CM | POA: Diagnosis not present

## 2020-02-16 DIAGNOSIS — Z20822 Contact with and (suspected) exposure to covid-19: Secondary | ICD-10-CM | POA: Diagnosis not present

## 2020-02-16 DIAGNOSIS — E1169 Type 2 diabetes mellitus with other specified complication: Secondary | ICD-10-CM | POA: Diagnosis present

## 2020-02-16 DIAGNOSIS — F419 Anxiety disorder, unspecified: Secondary | ICD-10-CM | POA: Diagnosis not present

## 2020-02-16 DIAGNOSIS — Z48810 Encounter for surgical aftercare following surgery on the sense organs: Secondary | ICD-10-CM | POA: Insufficient documentation

## 2020-02-16 DIAGNOSIS — Z87891 Personal history of nicotine dependence: Secondary | ICD-10-CM | POA: Diagnosis not present

## 2020-02-16 DIAGNOSIS — X58XXXA Exposure to other specified factors, initial encounter: Secondary | ICD-10-CM | POA: Diagnosis present

## 2020-02-16 DIAGNOSIS — Z91030 Bee allergy status: Secondary | ICD-10-CM | POA: Diagnosis not present

## 2020-02-16 DIAGNOSIS — S9305XD Dislocation of left ankle joint, subsequent encounter: Secondary | ICD-10-CM | POA: Diagnosis not present

## 2020-02-16 DIAGNOSIS — T8130XA Disruption of wound, unspecified, initial encounter: Secondary | ICD-10-CM | POA: Diagnosis not present

## 2020-02-16 DIAGNOSIS — D62 Acute posthemorrhagic anemia: Secondary | ICD-10-CM | POA: Diagnosis not present

## 2020-02-16 DIAGNOSIS — E559 Vitamin D deficiency, unspecified: Secondary | ICD-10-CM | POA: Diagnosis present

## 2020-02-16 DIAGNOSIS — G894 Chronic pain syndrome: Secondary | ICD-10-CM | POA: Diagnosis not present

## 2020-02-16 DIAGNOSIS — E662 Morbid (severe) obesity with alveolar hypoventilation: Secondary | ICD-10-CM | POA: Insufficient documentation

## 2020-02-16 DIAGNOSIS — I872 Venous insufficiency (chronic) (peripheral): Secondary | ICD-10-CM | POA: Insufficient documentation

## 2020-02-16 DIAGNOSIS — Z96662 Presence of left artificial ankle joint: Secondary | ICD-10-CM | POA: Insufficient documentation

## 2020-02-16 DIAGNOSIS — Z6841 Body Mass Index (BMI) 40.0 and over, adult: Secondary | ICD-10-CM | POA: Diagnosis not present

## 2020-02-16 DIAGNOSIS — Z880 Allergy status to penicillin: Secondary | ICD-10-CM | POA: Diagnosis not present

## 2020-02-16 DIAGNOSIS — M2550 Pain in unspecified joint: Secondary | ICD-10-CM | POA: Diagnosis not present

## 2020-02-16 DIAGNOSIS — E1142 Type 2 diabetes mellitus with diabetic polyneuropathy: Secondary | ICD-10-CM | POA: Diagnosis not present

## 2020-02-16 DIAGNOSIS — J301 Allergic rhinitis due to pollen: Secondary | ICD-10-CM | POA: Diagnosis present

## 2020-02-16 DIAGNOSIS — Z7401 Bed confinement status: Secondary | ICD-10-CM | POA: Diagnosis not present

## 2020-02-16 DIAGNOSIS — S82892B Other fracture of left lower leg, initial encounter for open fracture type I or II: Secondary | ICD-10-CM | POA: Diagnosis not present

## 2020-02-16 DIAGNOSIS — Z79891 Long term (current) use of opiate analgesic: Secondary | ICD-10-CM

## 2020-02-16 DIAGNOSIS — I4821 Permanent atrial fibrillation: Secondary | ICD-10-CM | POA: Diagnosis not present

## 2020-02-16 DIAGNOSIS — D649 Anemia, unspecified: Secondary | ICD-10-CM | POA: Insufficient documentation

## 2020-02-16 DIAGNOSIS — Z48817 Encounter for surgical aftercare following surgery on the skin and subcutaneous tissue: Secondary | ICD-10-CM | POA: Diagnosis not present

## 2020-02-16 DIAGNOSIS — E1040 Type 1 diabetes mellitus with diabetic neuropathy, unspecified: Secondary | ICD-10-CM | POA: Diagnosis not present

## 2020-02-16 DIAGNOSIS — G4733 Obstructive sleep apnea (adult) (pediatric): Secondary | ICD-10-CM | POA: Diagnosis not present

## 2020-02-16 DIAGNOSIS — Z7901 Long term (current) use of anticoagulants: Secondary | ICD-10-CM | POA: Diagnosis not present

## 2020-02-16 DIAGNOSIS — I240 Acute coronary thrombosis not resulting in myocardial infarction: Secondary | ICD-10-CM | POA: Diagnosis not present

## 2020-02-16 HISTORY — DX: Presence of left artificial ankle joint: Z96.662

## 2020-02-16 HISTORY — DX: Anemia, unspecified: D64.9

## 2020-02-16 HISTORY — DX: Vitamin D deficiency, unspecified: E55.9

## 2020-02-16 HISTORY — DX: Long term (current) use of opiate analgesic: Z79.891

## 2020-02-16 LAB — COMPREHENSIVE METABOLIC PANEL
ALT: 21 U/L (ref 0–44)
AST: 22 U/L (ref 15–41)
Albumin: 2.9 g/dL — ABNORMAL LOW (ref 3.5–5.0)
Alkaline Phosphatase: 77 U/L (ref 38–126)
Anion gap: 9 (ref 5–15)
BUN: 9 mg/dL (ref 6–20)
CO2: 29 mmol/L (ref 22–32)
Calcium: 8.7 mg/dL — ABNORMAL LOW (ref 8.9–10.3)
Chloride: 100 mmol/L (ref 98–111)
Creatinine, Ser: 1.05 mg/dL (ref 0.61–1.24)
GFR, Estimated: >60 mL/min (ref >60)
Glucose, Bld: 105 mg/dL — ABNORMAL HIGH (ref 70–99)
Potassium: 3.7 mmol/L (ref 3.5–5.1)
Sodium: 138 mmol/L (ref 135–145)
Total Bilirubin: 0.4 mg/dL (ref 0.3–1.2)
Total Protein: 7.2 g/dL (ref 6.5–8.1)

## 2020-02-16 LAB — CBC
HCT: 38.6 % — ABNORMAL LOW (ref 39.0–52.0)
Hemoglobin: 12.4 g/dL — ABNORMAL LOW (ref 13.0–17.0)
MCH: 29.8 pg (ref 26.0–34.0)
MCHC: 32.1 g/dL (ref 30.0–36.0)
MCV: 92.8 fL (ref 80.0–100.0)
Platelets: 302 10*3/uL (ref 150–400)
RBC: 4.16 MIL/uL — ABNORMAL LOW (ref 4.22–5.81)
RDW: 14.8 % (ref 11.5–15.5)
WBC: 8.4 10*3/uL (ref 4.0–10.5)
nRBC: 0 % (ref 0.0–0.2)

## 2020-02-16 LAB — VITAMIN D 25 HYDROXY (VIT D DEFICIENCY, FRACTURES): Vit D, 25-Hydroxy: 16.36 ng/mL — ABNORMAL LOW (ref 30–100)

## 2020-02-16 NOTE — Discharge Summary (Addendum)
Orthopaedic Trauma Service (OTS) Discharge Summary   Patient ID: David Fitzgerald MRN: [id number] DOB/AGE: [birth year] 60 y.o.  Admit date: 01/31/2020 Discharge date: 02/16/2020  Admission Diagnoses: Open left ankle fracture dislocation    Chronic pain syndrome   OSA treated with BiPAP-per pt he is not using bipap/cpap   Type 2 diabetes mellitus with diabetic neuropathy (HCC)   Chronic venous insufficiency   Anxiety   CAD in native artery   Chronic anticoagulation   Morbid obesity with BMI of 45.0-49.9, adult (HCC)   Hypertension   Permanent atrial fibrillation (HCC)   Chronic, continuous use of opioids   Vitamin D deficiency  Discharge Diagnoses:  Principal Problem:   Chronic pain syndrome Active Problems:   OSA treated with BiPAP-per pt he is not using bipap/cpap   Type 2 diabetes mellitus with diabetic neuropathy (HCC)   Chronic venous insufficiency   Anxiety   CAD in native artery   Chronic anticoagulation   Morbid obesity with BMI of 45.0-49.9, adult ([place])   Hypertension   Open fracture dislocation of left ankle   Ankle fracture   Permanent atrial fibrillation (HCC)   Chronic, continuous use of opioids   Vitamin D deficiency   Past Medical History:  Diagnosis Date  . Allergy to pollen 04/06/2015  . Anxiety 02/06/2017  . Arthritis 02/06/2017  . Bee sting allergy    wasp / mixed vespid  . Bell's palsy 02/24/2013  . Chronic venous insufficiency 12/14/2014  . Chronic, continuous use of opioids 02/15/2020  . Diabetes ([place])   . Edema 12/14/2014  . Hyperlipidemia, mixed 12/14/2014  . Hypertension   . Lumbar pain with radiation down left leg 07/09/2012  . Morbid obesity ([place]) 12/14/2014  . Morbid obesity with BMI of 45.0-49.9, adult ([place]) 07/09/2012  . Obesity hypoventilation syndrome ([place]) 12/14/2014  . OSA treated with BiPAP 12/14/2014  . Pain syndrome, chronic 07/09/2012  . Pernicious anemia 02/06/2017  . Polyneuropathy in diabetes ([place]) 02/24/2013  . Syndrome  affecting cervical region 02/24/2013  . Trigeminal neuralgia 02/24/2013  . Type 2 diabetes mellitus with diabetic neuropathy ([place]) 12/14/2014  . Vitamin D deficiency 02/16/2020   per pt he is not using bipap/cpap  Procedureds Performed: 01/31/2020- Dr. Marcelino Scot  1.  Open reduction internal fixation of left ankle trimalleolar fracture without fixation of the posterior lip. 2.  Open treatment of left ankle dislocation with K-wire fixation of the tibiotalar joint. 3.  Open reduction internal fixation of left ankle syndesmosis. 4.  Debridement of open fracture dislocation including skin, subcutaneous tissue, fascia and bone. 5.  Stress fluoroscopy, left ankle.  Discharged Condition: good  Hospital Course:   60 y/o s/p fall with open left ankle fracture dislocation on 01/31/2020. Underwent ORIF and I&D on day of presentation. Admitted due to medical comorbidities and social reasons. Pt did have some afib in PACU, medicine consulted. No further issues. pts hospital stay was uncomplicated. snf bed became available on 02/16/2020. Pt discharged in stable condition. Started on home anticoagulation pod 1. Received appropriate abx coverage for open fracture   Of not pt has history of OSA. He reports that he is NOT using bipap/cpap. Has not used it during this admission as well  Consults: internal medicine   Significant Diagnostic Studies: labs:  Results for ZORAN, YANKEE (MRN [id number]) as of 02/16/2020 13:05  Ref. Range 02/16/2020 00:31  COMPREHENSIVE METABOLIC PANEL Unknown Rpt (A)  Sodium Latest Ref Range: 135 - 145  mmol/L 138  Potassium Latest Ref Range: 3.5 - 5.1 mmol/L 3.7  Chloride Latest Ref Range: 98 - 111 mmol/L 100  CO2 Latest Ref Range: 22 - 32 mmol/L 29  Glucose Latest Ref Range: 70 - 99 mg/dL 105 (H)  BUN Latest Ref Range: 6 - 20 mg/dL 9  Creatinine Latest Ref Range: 0.61 - 1.24 mg/dL 1.05  Calcium Latest Ref Range: 8.9 - 10.3 mg/dL 8.7 (L)  Anion gap Latest Ref Range: 5 - 15  9   Alkaline Phosphatase Latest Ref Range: 38 - 126 U/L 77  Albumin Latest Ref Range: 3.5 - 5.0 g/dL 2.9 (L)  AST Latest Ref Range: 15 - 41 U/L 22  ALT Latest Ref Range: 0 - 44 U/L 21  Total Protein Latest Ref Range: 6.5 - 8.1 g/dL 7.2  Total Bilirubin Latest Ref Range: 0.3 - 1.2 mg/dL 0.4  GFR, Estimated Latest Ref Range: >60 mL/min >60  Vitamin D, 25-Hydroxy Latest Ref Range: 30 - 100 ng/mL 16.36 (L)  WBC Latest Ref Range: 4.0 - 10.5 K/uL 8.4  RBC Latest Ref Range: 4.22 - 5.81 MIL/uL 4.16 (L)  Hemoglobin Latest Ref Range: 13.0 - 17.0 g/dL 12.4 (L)  HCT Latest Ref Range: 39.0 - 52.0 % 38.6 (L)  MCV Latest Ref Range: 80.0 - 100.0 fL 92.8  MCH Latest Ref Range: 26.0 - 34.0 pg 29.8  MCHC Latest Ref Range: 30.0 - 36.0 g/dL 32.1  RDW Latest Ref Range: 11.5 - 15.5 % 14.8  Platelets Latest Ref Range: 150 - 400 K/uL 302  nRBC Latest Ref Range: 0.0 - 0.2 % 0.0     Treatments: IV hydration, antibiotics: Ancef, analgesia: ultram, therapies: PT, OT, RN and SW and surgery: as above  Discharge Exam:    Orthopaedic Trauma Service Progress Note  Patient ID: David Fitzgerald MRN: [id number] DOB/AGE: [birth year] 60 y.o.  Subjective:  Doing very well No complaints Ready to go to snf  Pain controlled with low dose tramadol   Baseline neuropathy B LEx   ROS As above  Objective:   VITALS:         Vitals:   02/14/20 0746 02/14/20 2017 02/15/20 0538 02/15/20 0839  BP: (!) 123/93 (!) 152/81 133/63 (!) 150/70  Pulse: 81 90 84 76  Resp: _0 Temp: 98 F (36.7 C) 97.6 F (36.4 C) 97.9 F (36.6 C) 98.4 F (36.9 C)  TempSrc: Oral Oral Oral Oral  SpO2: 95% 92% 94% 93%  Weight:      Height:        Estimated body mass index is 48.12 kg/m as calculated from the following:   Height as of this encounter: _1  (1.88 m).   Weight as of this encounter: 170 kg.   Intake/Output      01/10 0701 01/11 0700 01/11 0701 01/12 0700   P.O. 1080    Total  Intake(mL/kg) 1080 (6.4)    Urine (mL/kg/hr) 1700 (0.4) 650 (0.7)   Total Output 1700 650   Net -620 -650          LABS  Lab Results Last 24 Hours       Results for orders placed or performed during the hospital encounter of 01/31/20 (from the past 24 hour(s))  SARS CORONAVIRUS 2 (TAT 6-24 HRS) Nasopharyngeal Nasopharyngeal Swab     Status: None   Collection Time: 02/14/20 12:27 PM   Specimen: Nasopharyngeal Swab  Result Value Ref Range   SARS Coronavirus 2 NEGATIVE NEGATIVE  PHYSICAL EXAM:    Gen: resting comfortably in bed, NAD, pleasant Lungs: unlabored  Cardiac: Reg Ext:       Left Lower extremity              Splint removed             Medial ankle traumatic wound stable, no signs of infection              Other wounds stable             Pins plantar aspect of L heel stable             Motor and sensory functions at baseline             Swelling stable              SLC applied   Assessment/Plan: 15 Days Post-Op   Principal Problem:   Open fracture dislocation of left ankle Active Problems:   OSA treated with BiPAP-per pt he is not using bipap/cpap   Type 2 diabetes mellitus with diabetic neuropathy (HCC)   Chronic venous insufficiency   Anxiety   CAD in native artery   Chronic anticoagulation   Morbid obesity with BMI of 45.0-49.9, adult (HCC)   Hypertension   Ankle fracture   Permanent atrial fibrillation ([place])              Anti-infectives (From admission, onward)     Start     Dose/Rate Route Frequency Ordered Stop   02/01/20 0100  ceFAZolin (ANCEF) IVPB 2g/100 mL premix        2 g 200 mL/hr over 30 Minutes Intravenous Every 8 hours 02/01/20 0019 02/01/20 1733   01/31/20 1543  vancomycin (VANCOCIN) powder  Status:  Discontinued          As needed 01/31/20 1544 01/31/20 1638   01/31/20 0430  clindamycin (CLEOCIN) IVPB 900 mg  Status:  Discontinued        900 mg 100 mL/hr over 30 Minutes Intravenous Every 8 hours  01/31/20 0408 02/01/20 0017   01/31/20 0315  ceFAZolin (ANCEF) IVPB 2g/100 mL premix        2 g 200 mL/hr over 30 Minutes Intravenous  Once 01/31/20 0308 01/31/20 0547      .  POD/HD#: 38  60 year old male with complex medical history including sensory neuropathy bilateral lower extremities, chronic anticoagulation s/p ground-level fall with open left ankle fracture dislocation  -Open left ankle fracture dislocation including syndesmotic disruption s/p irrigation debridement and ORIF             Nonweightbearing 8 weeks             Splint removed and dressing changed.  Placed into short leg cast             Will remain casted for another 2 weeks with possible conversion to cam boot.  Tibial talocalcaneal pins will remain in place for a total of 6 weeks             Toe and knee motion okay             Continue with therapies              Awaiting for insurance authorization for SNF  - Pain management:             Pain is very well controlled             Low-dose tramadol  Continue with multimodal analgesia  - ABL anemia/Hemodynamics             Stable  - Medical issues              Home medications   No home bipap  - DVT/PE prophylaxis:             Home Eliquis restarted  - ID:              Antibiotics completed for open fracture treatment  - Metabolic Bone Disease:             Medical history and fracture pattern suggestive of osteoporosis             Meets criteria for fracture liaison consultation.  Order placed             Supplement vitamin D   - FEN/GI prophylaxis/Foley/Lines:             Carb diet  -Ex-fix/Splint care:             Short leg cast             Do not let cast get wet                         Will order a cast shoe  - Impediments to fracture healing:             Open fracture             Diabetes             Neuropathy  - Dispo:             Stable for skilled nursing facility transfer             Awaiting  insurance authorization    Disposition:    Allergies as of 02/16/2020      Reactions   Penicillins Swelling   Aspirin    Relative contraindication due to hematochezia while on it   Bee Venom       Medication List    TAKE these medications   acetaminophen 325 MG tablet Commonly known as: TYLENOL Take 1-2 tablets (325-650 mg total) by mouth every 6 (six) hours as needed for mild pain (pain score 1-3 or temp > 100.5).   ascorbic acid 1000 MG tablet Commonly known as: VITAMIN C Take 1 tablet (1,000 mg total) by mouth daily.   cyanocobalamin 1000 MCG tablet Take 1 tablet (1,000 mcg total) by mouth daily. What changed:   medication strength  how much to take   dicyclomine 20 MG tablet Commonly known as: BENTYL Take 1 tablet by mouth 2 (two) times daily.   docusate sodium 100 MG capsule Commonly known as: COLACE Take 1 capsule (100 mg total) by mouth 2 (two) times daily.   DULoxetine 60 MG capsule Commonly known as: CYMBALTA Take 60 mg by mouth daily.   Eliquis 5 MG Tabs tablet Generic drug: apixaban TAKE 1 TABLET BY MOUTH TWICE DAILY What changed: how much to take   EPINEPHrine 0.3 mg/0.3 mL Soaj injection Commonly known as: EPI-PEN Use for life-threatening allergic reactions   furosemide 40 MG tablet Commonly known as: LASIX Take 40 mg by mouth daily as needed for fluid or edema.   gabapentin 300 MG capsule Commonly known as: NEURONTIN Take 600 mg by mouth 3 (three) times daily.   metFORMIN 500 MG tablet Commonly known as: GLUCOPHAGE Take 500 mg  by mouth daily with breakfast.   metoprolol tartrate 25 MG tablet Commonly known as: LOPRESSOR Take 1 tablet (25 mg total) by mouth at bedtime. What changed: You were already taking a medication with the same name, and this prescription was added. Make sure you understand how and when to take each.   metoprolol tartrate 50 MG tablet Commonly known as: LOPRESSOR Take 1 tablet (50 mg total) by mouth  daily. What changed:   medication strength  how much to take  when to take this   MIXED VESPID VENOM PROTEIN  Inject into the skin.   naloxone 4 MG/0.1ML Liqd nasal spray kit Commonly known as: NARCAN Place 1 spray into the nose See admin instructions. USE 1 SPRAY NASALLY AS NEEDED FOR OPIOID OVERDOSE EMERGENCY   naproxen sodium 220 MG tablet Commonly known as: ALEVE Take 440 mg by mouth daily as needed (headache).   nitroGLYCERIN 0.4 MG SL tablet Commonly known as: NITROSTAT Place 0.4 mg under the tongue every 5 (five) minutes as needed. Reported on 04/26/2015   ondansetron 4 MG disintegrating tablet Commonly known as: ZOFRAN-ODT Take 4 mg by mouth every 8 (eight) hours as needed for nausea or vomiting.   oxyCODONE-acetaminophen 7.5-325 MG tablet Commonly known as: PERCOCET Take 1 tablet by mouth every 8 (eight) hours as needed for severe pain.   pantoprazole 40 MG tablet Commonly known as: PROTONIX Take 40 mg by mouth daily.   simvastatin 20 MG tablet Commonly known as: ZOCOR Take 20 mg by mouth at bedtime.   tamsulosin 0.4 MG Caps capsule Commonly known as: FLOMAX Take 0.4 mg by mouth at bedtime.   traMADol 50 MG tablet Commonly known as: ULTRAM Take 1 tablet (50 mg total) by mouth every 6 (six) hours as needed for moderate pain or severe pain.   VENOMIL WASP VENOM IJ Inject as directed.   Vitamin D 125 MCG (5000 UT) Caps Take 1 capsule by mouth daily.       Contact information for follow-up providers    Angelina Sheriff, MD. Schedule an appointment as soon as possible for a visit.   Specialty: Family Medicine Why: Upon discharge from SNF. Contact information: [place] [address] [phone]        Richardo Priest, MD. Schedule an appointment as soon as possible for a visit.   Specialties: Cardiology, Radiology Why: Upon discharge from SNF. Contact information: [place] [address] [phone]         Altamese Halchita, MD. Schedule an appointment as soon as possible for a visit in 2 week(s).   Specialty: Orthopedic Surgery Contact information: [place] [address] [phone]            Contact information for after-discharge care    Destination    [facility] .   Service: Skilled Nursing Contact information: [place] [phone]                  Discharge Instructions and Plan:  61 y/o male s/p ORIF and I&D open L ankle fracture dislocation   Weightbearing: NWB LLE Insicional and dressing care: Dressings left intact until follow-up Orthopedic device(s): cast (short leg cast left leg) Showering: ok to shower but keep cast clean and dry  Pain control: multimodal, well controlled on ultram,on chronic pain regimen with percocet but  Has not used percocet in about a week while in hospital  Bone Health/Optimization: labs show vitamin d deficiency. Take supplements  Follow - up plan: 2-3 weeks Contact information:  Altamese Rogers MD, Ainsley Spinner PA-C  Signed:  Jari Pigg, PA-C [phone] Loletha Grayer) 02/16/2020, 1:08 PM  [facility] [place] [address] [phone] Domingo Sep (F)

## 2020-02-16 NOTE — Progress Notes (Signed)
Discharge summary packet provided to [facility] with the necessary documents. Pt D/C to [facility] as ordered. Pt remains alert/oreinted in no apparent distress. No complaints voiced.

## 2020-02-16 NOTE — Progress Notes (Signed)
Report given to [facility] nurse at [facility]. All questions and concerns were answered.

## 2020-02-16 NOTE — Plan of Care (Signed)

## 2020-02-16 NOTE — TOC Progression Note (Signed)
Transition of Care [facility]) - Progression Note    Patient Details  Name: David Fitzgerald MRN: [id number] Date of Birth: January 04, 1961  Transition of Care [facility]) CM/SW Contact  Wandra Feinstein [place], [place] Phone Number: 02/16/2020, 12:36 PM  Clinical Narrative:  [name] to [name] with [facility] who reports auth received and they are prepared to admit pt today. Pt reports he has not used a CPAP in over a year and does not need it for use at HS at [facility]. MD paged with update.   Wandra Feinstein, MSW, LCSW [phone] (coverage)       Expected Discharge Plan: [place] Barriers to Discharge: Continued Medical Work up  Expected Discharge Plan and Services Expected Discharge Plan: [place]   Discharge Planning Services: CM Consult Post Acute Care Choice: [place] Living arrangements for the past 2 months: Single Family Home                                       Social Determinants of Health (SDOH) Interventions    Readmission Risk Interventions Readmission Risk Prevention Plan 02/02/2020  Post Dischage Appt Complete  Medication Screening Complete  Transportation Screening Complete  Some recent data might be hidden

## 2020-02-16 NOTE — TOC Transition Note (Signed)
Transition of Care [facility]) - CM/SW Discharge Note   Patient Details  Name: David Fitzgerald MRN: [id number] Date of Birth: September 13, 1960  Transition of Care [facility]) CM/SW Contact:  [name], [place] Phone Number: 02/16/2020, 2:12 PM   Clinical Narrative:  [name] to [name] with [facility] who confirmed bariatric bed delivered and they are prepared to admit pt today. Pt aware of dc and reports agreeable. [facility] arranged for transport and RN provided with number for report. SW signing off at dc.   Wandra Feinstein, MSW, LCSW [phone] (coverage)        Final next level of care: Skilled Nursing Facility Barriers to Discharge: No Barriers Identified   Patient Goals and CMS Choice Patient states their goals for this hospitalization and ongoing recovery are:: Patient plans to discharge to SNF facility - prefers [facility] or [facility] SN[facility]. CMS Medicare.gov Compare Post Acute Care list provided to:: Patient Choice offered to / list presented to : Patient  Discharge Placement              Patient chooses bed at: Other - please specify in the comment section below: [facility]) Patient to be transferred to facility by: [place] Name of family member notified: Pt to update family Patient and family notified of of transfer: 02/16/20  Discharge Plan and Services   Discharge Planning Services: CM Consult Post Acute Care Choice: [place]                               Social Determinants of Health (SDOH) Interventions     Readmission Risk Interventions Readmission Risk Prevention Plan 02/02/2020  Post Dischage Appt Complete  Medication Screening Complete  Transportation Screening Complete  Some recent data might be hidden

## 2020-02-16 NOTE — Progress Notes (Signed)
Occupational Therapy Treatment Patient Details Name: David Fitzgerald MRN: [id number] DOB: Nov 05, 1960 Today's Date: 02/16/2020    History of present illness David Fitzgerald is a 60 y.o. male with medical history significant of paroxysmal A. fib on Eliquis, IIDM, diabetic neuropathy, morbid obesity, BPH, venous insufficiency on as needed Lasix, presented with mechanical fall and left ankle fracture. Now s/p ORIF, NWB. Plan for x-ray 12/31.   OT comments  Pt making good progress with functional goals. OT will continue to follow acutely to maximize level of function and safety  Follow Up Recommendations  SNF;Supervision/Assistance - 24 hour    Equipment Recommendations  Wheelchair (measurements OT);Wheelchair cushion (measurements OT);Tub/shower bench;Other (comment)    Recommendations for Other Services      Precautions / Restrictions Precautions Precautions: Fall Precaution Comments: watch HR Restrictions Weight Bearing Restrictions: Yes LLE Weight Bearing: Non weight bearing       Mobility Bed Mobility               General bed mobility comments: sitting EOB on arrival  Transfers Overall transfer level: Needs assistance Equipment used: Rolling walker (2 wheeled) Transfers: Sit to/from Stand Sit to Stand: Min guard         General transfer comment: min guard for safety, able to maintain NWB on L during standing    Balance Overall balance assessment: Needs assistance Sitting-balance support: No upper extremity supported;Feet supported Sitting balance-[name]: Good     Standing balance support: Bilateral upper extremity supported;During functional activity Standing balance-[name]: Poor                             ADL either performed or assessed with clinical judgement   ADL Overall ADL's : Needs assistance/impaired     Grooming: Wash/dry face;Wash/dry hands;Min guard;Standing   Upper Body Bathing: Set up;Sitting   Lower Body  Bathing: Set up;Sitting/lateral leans;Supervison/ safety   Upper Body Dressing : Set up;Sitting       Toilet Transfer: RW;Min guard;Ambulation;Comfort height toilet;Regular Toilet;Grab bars   Toileting- Clothing Manipulation and Hygiene: Sit to/from stand;Min guard       Functional mobility during ADLs: Min guard;Rolling walker General ADL Comments: Pt with good adherence to WB precautions.     Vision Patient Visual Report: No change from baseline     Perception     Praxis      Cognition Arousal/Alertness: Awake/alert Behavior During Therapy: WFL for tasks assessed/performed Overall Cognitive Status: Within Functional Limits for tasks assessed                                          Exercises     Shoulder Instructions       General Comments      Pertinent Vitals/ Pain       Pain Assessment: No/denies pain Faces Pain Scale: No hurt Pain Intervention(s): Monitored during session  Home Living                                          Prior Functioning/Environment              Frequency  Min 2X/week        Progress Toward Goals  OT Goals(current goals can now be found  in the care plan section)  Progress towards OT goals: Progressing toward goals  Acute Rehab OT Goals Patient Stated Goal: Wants to be as independent as possible when he gets home  Plan Discharge plan remains appropriate    Co-evaluation                 AM-PAC OT "6 Clicks" Daily Activity     Outcome Measure   Help from another person eating meals?: None Help from another person taking care of personal grooming?: A Little Help from another person toileting, which includes using toliet, bedpan, or urinal?: A Little Help from another person bathing (including washing, rinsing, drying)?: A Little Help from another person to put on and taking off regular upper body clothing?: None Help from another person to put on and taking off regular  lower body clothing?: A Little 6 Click Score: 20    End of Session Equipment Utilized During Treatment: Gait belt;Rolling walker  OT Visit Diagnosis: Unsteadiness on feet (R26.81);Other abnormalities of gait and mobility (R26.89);Muscle weakness (generalized) (M62.81)   Activity Tolerance Patient tolerated treatment well   Patient Left in bed;with call bell/phone within reach;with family/visitor present;Other (comment) (sitting EOB)   Nurse Communication          Time: 1751-0258 OT Time Calculation (min): 27 min  Charges: OT General Charges $OT Visit: 1 Visit OT Treatments $Self Care/Home Management : 8-22 mins $Therapeutic Activity: 8-22 mins     [name] 02/16/2020, 2:40 PM

## 2020-02-17 DIAGNOSIS — E559 Vitamin D deficiency, unspecified: Secondary | ICD-10-CM | POA: Diagnosis not present

## 2020-02-17 DIAGNOSIS — I872 Venous insufficiency (chronic) (peripheral): Secondary | ICD-10-CM | POA: Diagnosis not present

## 2020-02-17 DIAGNOSIS — I240 Acute coronary thrombosis not resulting in myocardial infarction: Secondary | ICD-10-CM | POA: Diagnosis not present

## 2020-02-17 DIAGNOSIS — G894 Chronic pain syndrome: Secondary | ICD-10-CM | POA: Diagnosis not present

## 2020-02-17 DIAGNOSIS — S9305XD Dislocation of left ankle joint, subsequent encounter: Secondary | ICD-10-CM | POA: Diagnosis not present

## 2020-02-17 DIAGNOSIS — M6281 Muscle weakness (generalized): Secondary | ICD-10-CM

## 2020-02-17 DIAGNOSIS — E1040 Type 1 diabetes mellitus with diabetic neuropathy, unspecified: Secondary | ICD-10-CM | POA: Diagnosis not present

## 2020-02-17 DIAGNOSIS — E662 Morbid (severe) obesity with alveolar hypoventilation: Secondary | ICD-10-CM | POA: Diagnosis not present

## 2020-02-17 DIAGNOSIS — F419 Anxiety disorder, unspecified: Secondary | ICD-10-CM | POA: Diagnosis not present

## 2020-02-17 DIAGNOSIS — Z4789 Encounter for other orthopedic aftercare: Secondary | ICD-10-CM | POA: Diagnosis not present

## 2020-02-17 DIAGNOSIS — R262 Difficulty in walking, not elsewhere classified: Secondary | ICD-10-CM | POA: Insufficient documentation

## 2020-02-17 DIAGNOSIS — I10 Essential (primary) hypertension: Secondary | ICD-10-CM | POA: Diagnosis not present

## 2020-02-17 DIAGNOSIS — E1140 Type 2 diabetes mellitus with diabetic neuropathy, unspecified: Secondary | ICD-10-CM | POA: Diagnosis not present

## 2020-02-17 DIAGNOSIS — S82892B Other fracture of left lower leg, initial encounter for open fracture type I or II: Secondary | ICD-10-CM | POA: Diagnosis not present

## 2020-02-17 DIAGNOSIS — I2510 Atherosclerotic heart disease of native coronary artery without angina pectoris: Secondary | ICD-10-CM | POA: Diagnosis not present

## 2020-02-17 DIAGNOSIS — R279 Unspecified lack of coordination: Secondary | ICD-10-CM | POA: Insufficient documentation

## 2020-02-17 DIAGNOSIS — I4821 Permanent atrial fibrillation: Secondary | ICD-10-CM | POA: Diagnosis not present

## 2020-02-17 HISTORY — DX: Muscle weakness (generalized): M62.81

## 2020-02-17 HISTORY — DX: Unspecified lack of coordination: R27.9

## 2020-02-17 HISTORY — DX: Difficulty in walking, not elsewhere classified: R26.2

## 2020-02-21 ENCOUNTER — Other Ambulatory Visit: Payer: Self-pay

## 2020-02-21 NOTE — Patient Outreach (Signed)
[facility]) Care Management  02/21/2020  David Fitzgerald [birth year] [id number]   EMMI- General Discharge RED ON EMMI ALERT Day # 1 Date: 02/19/20 Red Alert Reason:  Got discharge papers? I Don't Know    EMMI received. No outreach warranted as patient is in SNF.   Plan: RN CM will close case.    David Baseman, RN, MSN [facility] Care Management Care Management Coordinator Direct Line [phone] Toll Free: [phone]  Fax: [phone]

## 2020-03-01 ENCOUNTER — Other Ambulatory Visit: Payer: Self-pay

## 2020-03-01 ENCOUNTER — Ambulatory Visit (INDEPENDENT_AMBULATORY_CARE_PROVIDER_SITE_OTHER): Payer: Medicare HMO

## 2020-03-01 DIAGNOSIS — T63441D Toxic effect of venom of bees, accidental (unintentional), subsequent encounter: Secondary | ICD-10-CM | POA: Diagnosis not present

## 2020-03-02 DIAGNOSIS — Z48817 Encounter for surgical aftercare following surgery on the skin and subcutaneous tissue: Secondary | ICD-10-CM | POA: Diagnosis not present

## 2020-03-02 DIAGNOSIS — S9305XD Dislocation of left ankle joint, subsequent encounter: Secondary | ICD-10-CM | POA: Diagnosis not present

## 2020-03-06 DIAGNOSIS — S82892B Other fracture of left lower leg, initial encounter for open fracture type I or II: Secondary | ICD-10-CM | POA: Diagnosis not present

## 2020-03-06 DIAGNOSIS — E1040 Type 1 diabetes mellitus with diabetic neuropathy, unspecified: Secondary | ICD-10-CM | POA: Diagnosis not present

## 2020-03-06 DIAGNOSIS — S9305XD Dislocation of left ankle joint, subsequent encounter: Secondary | ICD-10-CM | POA: Diagnosis not present

## 2020-03-06 DIAGNOSIS — R58 Hemorrhage, not elsewhere classified: Secondary | ICD-10-CM | POA: Diagnosis not present

## 2020-03-06 DIAGNOSIS — Z48817 Encounter for surgical aftercare following surgery on the skin and subcutaneous tissue: Secondary | ICD-10-CM | POA: Diagnosis not present

## 2020-03-08 ENCOUNTER — Inpatient Hospital Stay (HOSPITAL_COMMUNITY)
Admission: AD | Admit: 2020-03-08 | Discharge: 2020-03-14 | DRG: 902 | Disposition: A | Discharge status: Home / Self Care | Payer: Medicare HMO | Attending: Orthopedic Surgery | Admitting: Orthopedic Surgery

## 2020-03-08 ENCOUNTER — Encounter: Payer: Self-pay | Admitting: Orthopedic Surgery

## 2020-03-08 ENCOUNTER — Other Ambulatory Visit: Payer: Self-pay

## 2020-03-08 DIAGNOSIS — E782 Mixed hyperlipidemia: Secondary | ICD-10-CM | POA: Diagnosis present

## 2020-03-08 DIAGNOSIS — F1190 Opioid use, unspecified, uncomplicated: Secondary | ICD-10-CM | POA: Diagnosis present

## 2020-03-08 DIAGNOSIS — E1169 Type 2 diabetes mellitus with other specified complication: Secondary | ICD-10-CM | POA: Diagnosis not present

## 2020-03-08 DIAGNOSIS — T8130XA Disruption of wound, unspecified, initial encounter: Secondary | ICD-10-CM | POA: Diagnosis not present

## 2020-03-08 DIAGNOSIS — E559 Vitamin D deficiency, unspecified: Secondary | ICD-10-CM | POA: Diagnosis present

## 2020-03-08 DIAGNOSIS — F419 Anxiety disorder, unspecified: Secondary | ICD-10-CM | POA: Diagnosis present

## 2020-03-08 DIAGNOSIS — E119 Type 2 diabetes mellitus without complications: Secondary | ICD-10-CM

## 2020-03-08 DIAGNOSIS — Z880 Allergy status to penicillin: Secondary | ICD-10-CM | POA: Diagnosis not present

## 2020-03-08 DIAGNOSIS — G4733 Obstructive sleep apnea (adult) (pediatric): Secondary | ICD-10-CM | POA: Diagnosis not present

## 2020-03-08 DIAGNOSIS — M1990 Unspecified osteoarthritis, unspecified site: Secondary | ICD-10-CM | POA: Diagnosis present

## 2020-03-08 DIAGNOSIS — Z91030 Bee allergy status: Secondary | ICD-10-CM | POA: Diagnosis not present

## 2020-03-08 DIAGNOSIS — Z7901 Long term (current) use of anticoagulants: Secondary | ICD-10-CM | POA: Diagnosis not present

## 2020-03-08 DIAGNOSIS — I872 Venous insufficiency (chronic) (peripheral): Secondary | ICD-10-CM | POA: Diagnosis present

## 2020-03-08 DIAGNOSIS — S82852E Displaced trimalleolar fracture of left lower leg, subsequent encounter for open fracture type I or II with routine healing: Secondary | ICD-10-CM | POA: Diagnosis not present

## 2020-03-08 DIAGNOSIS — Z8249 Family history of ischemic heart disease and other diseases of the circulatory system: Secondary | ICD-10-CM

## 2020-03-08 DIAGNOSIS — Z6841 Body Mass Index (BMI) 40.0 and over, adult: Secondary | ICD-10-CM | POA: Diagnosis not present

## 2020-03-08 DIAGNOSIS — G894 Chronic pain syndrome: Secondary | ICD-10-CM | POA: Diagnosis present

## 2020-03-08 DIAGNOSIS — T8133XA Disruption of traumatic injury wound repair, initial encounter: Secondary | ICD-10-CM | POA: Diagnosis not present

## 2020-03-08 DIAGNOSIS — S82852B Displaced trimalleolar fracture of left lower leg, initial encounter for open fracture type I or II: Secondary | ICD-10-CM | POA: Diagnosis present

## 2020-03-08 DIAGNOSIS — X58XXXA Exposure to other specified factors, initial encounter: Secondary | ICD-10-CM | POA: Diagnosis present

## 2020-03-08 DIAGNOSIS — T8133XD Disruption of traumatic injury wound repair, subsequent encounter: Secondary | ICD-10-CM

## 2020-03-08 DIAGNOSIS — S82892B Other fracture of left lower leg, initial encounter for open fracture type I or II: Secondary | ICD-10-CM | POA: Diagnosis present

## 2020-03-08 DIAGNOSIS — I10 Essential (primary) hypertension: Secondary | ICD-10-CM | POA: Diagnosis present

## 2020-03-08 DIAGNOSIS — J301 Allergic rhinitis due to pollen: Secondary | ICD-10-CM | POA: Diagnosis present

## 2020-03-08 DIAGNOSIS — E8889 Other specified metabolic disorders: Secondary | ICD-10-CM | POA: Diagnosis present

## 2020-03-08 DIAGNOSIS — M869 Osteomyelitis, unspecified: Secondary | ICD-10-CM | POA: Diagnosis not present

## 2020-03-08 DIAGNOSIS — E1140 Type 2 diabetes mellitus with diabetic neuropathy, unspecified: Secondary | ICD-10-CM | POA: Diagnosis present

## 2020-03-08 DIAGNOSIS — D62 Acute posthemorrhagic anemia: Secondary | ICD-10-CM | POA: Diagnosis present

## 2020-03-08 DIAGNOSIS — Z87891 Personal history of nicotine dependence: Secondary | ICD-10-CM

## 2020-03-08 DIAGNOSIS — Z7984 Long term (current) use of oral hypoglycemic drugs: Secondary | ICD-10-CM

## 2020-03-08 DIAGNOSIS — E662 Morbid (severe) obesity with alveolar hypoventilation: Secondary | ICD-10-CM | POA: Diagnosis not present

## 2020-03-08 DIAGNOSIS — Z20822 Contact with and (suspected) exposure to covid-19: Secondary | ICD-10-CM | POA: Diagnosis not present

## 2020-03-08 DIAGNOSIS — E1142 Type 2 diabetes mellitus with diabetic polyneuropathy: Secondary | ICD-10-CM | POA: Diagnosis present

## 2020-03-08 DIAGNOSIS — I4821 Permanent atrial fibrillation: Secondary | ICD-10-CM | POA: Diagnosis not present

## 2020-03-08 DIAGNOSIS — S9305XD Dislocation of left ankle joint, subsequent encounter: Secondary | ICD-10-CM | POA: Diagnosis not present

## 2020-03-08 DIAGNOSIS — Z886 Allergy status to analgesic agent status: Secondary | ICD-10-CM

## 2020-03-08 DIAGNOSIS — Z79899 Other long term (current) drug therapy: Secondary | ICD-10-CM

## 2020-03-08 DIAGNOSIS — L97328 Non-pressure chronic ulcer of left ankle with other specified severity: Secondary | ICD-10-CM | POA: Diagnosis not present

## 2020-03-08 HISTORY — DX: Disruption of traumatic injury wound repair, initial encounter: T81.33XA

## 2020-03-08 HISTORY — DX: Unspecified atrial fibrillation: I48.91

## 2020-03-08 LAB — COMPREHENSIVE METABOLIC PANEL
ALT: 16 U/L (ref 0–44)
AST: 20 U/L (ref 15–41)
Albumin: 2.9 g/dL — ABNORMAL LOW (ref 3.5–5.0)
Alkaline Phosphatase: 76 U/L (ref 38–126)
Anion gap: 12 (ref 5–15)
BUN: 13 mg/dL (ref 6–20)
CO2: 25 mmol/L (ref 22–32)
Calcium: 8.8 mg/dL — ABNORMAL LOW (ref 8.9–10.3)
Chloride: 100 mmol/L (ref 98–111)
Creatinine, Ser: 1.35 mg/dL — ABNORMAL HIGH (ref 0.61–1.24)
GFR, Estimated: >60 mL/min (ref >60)
Glucose, Bld: 96 mg/dL (ref 70–99)
Potassium: 4.1 mmol/L (ref 3.5–5.1)
Sodium: 137 mmol/L (ref 135–145)
Total Bilirubin: 0.8 mg/dL (ref 0.3–1.2)
Total Protein: 7.7 g/dL (ref 6.5–8.1)

## 2020-03-08 LAB — CBC WITH DIFFERENTIAL/PLATELET
Abs Immature Granulocytes: 0.16 10*3/uL — ABNORMAL HIGH (ref 0.00–0.07)
Basophils Absolute: 0.1 10*3/uL (ref 0.0–0.1)
Basophils Relative: 1 %
Eosinophils Absolute: 0.3 10*3/uL (ref 0.0–0.5)
Eosinophils Relative: 3 %
HCT: 42 % (ref 39.0–52.0)
Hemoglobin: 12.7 g/dL — ABNORMAL LOW (ref 13.0–17.0)
Immature Granulocytes: 2 %
Lymphocytes Relative: 14 %
Lymphs Abs: 1.5 10*3/uL (ref 0.7–4.0)
MCH: 28.2 pg (ref 26.0–34.0)
MCHC: 30.2 g/dL (ref 30.0–36.0)
MCV: 93.3 fL (ref 80.0–100.0)
Monocytes Absolute: 0.9 10*3/uL (ref 0.1–1.0)
Monocytes Relative: 9 %
Neutro Abs: 7.7 10*3/uL (ref 1.7–7.7)
Neutrophils Relative %: 71 %
Platelets: 460 10*3/uL — ABNORMAL HIGH (ref 150–400)
RBC: 4.5 MIL/uL (ref 4.22–5.81)
RDW: 14.5 % (ref 11.5–15.5)
WBC: 10.6 10*3/uL — ABNORMAL HIGH (ref 4.0–10.5)
nRBC: 0 % (ref 0.0–0.2)

## 2020-03-08 LAB — HEMOGLOBIN A1C
Hgb A1c MFr Bld: 6 % — ABNORMAL HIGH (ref 4.8–5.6)
Mean Plasma Glucose: 125.5 mg/dL

## 2020-03-08 LAB — SARS CORONAVIRUS 2 (TAT 6-24 HRS): SARS Coronavirus 2: NEGATIVE

## 2020-03-08 LAB — VITAMIN D 25 HYDROXY (VIT D DEFICIENCY, FRACTURES): Vit D, 25-Hydroxy: 19.57 ng/mL — ABNORMAL LOW (ref 30–100)

## 2020-03-08 LAB — GLUCOSE, CAPILLARY
Glucose-Capillary: 96 mg/dL (ref 70–99)
Glucose-Capillary: 87 mg/dL (ref 70–99)

## 2020-03-08 LAB — PROTIME-INR
INR: 1.4 — ABNORMAL HIGH (ref 0.8–1.2)
Prothrombin Time: 16.3 seconds — ABNORMAL HIGH (ref 11.4–15.2)

## 2020-03-08 LAB — HIV ANTIBODY (ROUTINE TESTING W REFLEX): HIV Screen 4th Generation wRfx: NONREACTIVE

## 2020-03-08 MED ORDER — ASCORBIC ACID 500 MG PO TABS
1000.0000 mg | ORAL_TABLET | Freq: Every day | ORAL | Status: DC
  Administered 2020-03-09 – 2020-03-14 (×5): 1000 mg via ORAL
  Filled 2020-03-08 (×5): qty 2

## 2020-03-08 MED ORDER — MORPHINE SULFATE (PF) 2 MG/ML IV SOLN: 0.5000 mg | INTRAVENOUS | Status: DC | PRN

## 2020-03-08 MED ORDER — PANTOPRAZOLE SODIUM 40 MG PO TBEC
40.0000 mg | DELAYED_RELEASE_TABLET | Freq: Every day | ORAL | Status: DC
  Administered 2020-03-09 – 2020-03-14 (×6): 40 mg via ORAL
  Filled 2020-03-08 (×6): qty 1

## 2020-03-08 MED ORDER — NITROGLYCERIN 0.4 MG SL SUBL: 0.4000 mg | SUBLINGUAL_TABLET | SUBLINGUAL | Status: DC | PRN

## 2020-03-08 MED ORDER — DOCUSATE SODIUM 100 MG PO CAPS
100.0000 mg | ORAL_CAPSULE | Freq: Two times a day (BID) | ORAL | Status: DC
  Administered 2020-03-08 – 2020-03-14 (×10): 100 mg via ORAL
  Filled 2020-03-08 (×11): qty 1

## 2020-03-08 MED ORDER — METOPROLOL TARTRATE 50 MG PO TABS
50.0000 mg | ORAL_TABLET | Freq: Every day | ORAL | Status: DC
  Administered 2020-03-09 – 2020-03-14 (×6): 50 mg via ORAL
  Filled 2020-03-08 (×6): qty 1

## 2020-03-08 MED ORDER — METOPROLOL TARTRATE 25 MG PO TABS
25.0000 mg | ORAL_TABLET | Freq: Every day | ORAL | Status: DC
  Administered 2020-03-08 – 2020-03-13 (×6): 25 mg via ORAL
  Filled 2020-03-08 (×6): qty 1

## 2020-03-08 MED ORDER — INSULIN ASPART 100 UNIT/ML ~~LOC~~ SOLN
0.0000 [IU] | Freq: Three times a day (TID) | SUBCUTANEOUS | Status: DC
  Administered 2020-03-09 – 2020-03-14 (×7): 2 [IU] via SUBCUTANEOUS

## 2020-03-08 MED ORDER — HYDROCODONE-ACETAMINOPHEN 5-325 MG PO TABS: 1.0000 | ORAL_TABLET | Freq: Four times a day (QID) | ORAL | Status: DC | PRN

## 2020-03-08 MED ORDER — TRAMADOL HCL 50 MG PO TABS: 50.0000 mg | ORAL_TABLET | Freq: Four times a day (QID) | ORAL | Status: AC | PRN

## 2020-03-08 MED ORDER — FUROSEMIDE 40 MG PO TABS: 40.0000 mg | ORAL_TABLET | Freq: Every day | ORAL | Status: DC | PRN

## 2020-03-08 MED ORDER — ACETAMINOPHEN 325 MG PO TABS: 325.0000 mg | ORAL_TABLET | Freq: Four times a day (QID) | ORAL | Status: DC | PRN

## 2020-03-08 MED ORDER — VITAMIN D 25 MCG (1000 UNIT) PO TABS
5000.0000 [IU] | ORAL_TABLET | Freq: Every day | ORAL | Status: DC
  Administered 2020-03-09 – 2020-03-14 (×5): 5000 [IU] via ORAL
  Filled 2020-03-08 (×5): qty 5

## 2020-03-08 MED ORDER — GABAPENTIN 300 MG PO CAPS
600.0000 mg | ORAL_CAPSULE | Freq: Three times a day (TID) | ORAL | Status: DC
  Administered 2020-03-08 – 2020-03-14 (×16): 600 mg via ORAL
  Filled 2020-03-08 (×16): qty 2

## 2020-03-08 MED ORDER — SODIUM CHLORIDE 0.9 % IV SOLN
2.0000 g | INTRAVENOUS | Status: DC
  Administered 2020-03-08 – 2020-03-13 (×6): 2 g via INTRAVENOUS
  Filled 2020-03-08 (×6): qty 20

## 2020-03-08 MED ORDER — TAMSULOSIN HCL 0.4 MG PO CAPS
0.4000 mg | ORAL_CAPSULE | Freq: Every day | ORAL | Status: DC
  Administered 2020-03-08 – 2020-03-13 (×6): 0.4 mg via ORAL
  Filled 2020-03-08 (×6): qty 1

## 2020-03-08 MED ORDER — APIXABAN 5 MG PO TABS
5.0000 mg | ORAL_TABLET | Freq: Two times a day (BID) | ORAL | Status: DC
  Administered 2020-03-08 – 2020-03-14 (×11): 5 mg via ORAL
  Filled 2020-03-08 (×11): qty 1

## 2020-03-08 MED ORDER — NALOXONE HCL 0.4 MG/ML IJ SOLN: 0.4000 mg | INTRAMUSCULAR | Status: DC | PRN

## 2020-03-08 NOTE — Progress Notes (Signed)
Late Add-On.  Unable to contact patient.  Left message with arrival time and instructions.

## 2020-03-08 NOTE — H&P (Signed)
Orthopaedic Trauma Service (OTS) Consult   Patient ID: David Fitzgerald MRN: [id number] DOB/AGE: Mar 03, 1960 60 y.o.   HPI: David Fitzgerald is an 60 y.o. male who sustained an open left trimalleolar ankle fracture dislocation at the end of December 2021.  Patient was taken to the OR for formal irrigation debridement as well as ORIF of his ankle.  He remained hospitalized for about another 2 weeks after surgery while he awaited bed at a skilled nursing center.  He was ultimately discharged second week of January.  Prior to discharge he was placed into a short leg cast.  Presented for first follow-up in our office on 03/08/2020.  He had bleeding from the plantar aspect of his heel out of the cast.  Upon removal his medial ankle wound was quite erythematous and had signs consistent with infection.  His heel was also severely macerated around his tibiotalar calcaneal pins.  His wound was probed in the office no frank purulence was encountered his tibiotalar calcaneal pins were removed as well.  Given his medical history as well as his clinical presentation the directly admitted him to the hospital for IV antibiotics.  We would also anticipate formal I&D of his medial wound either Thursday or Friday of this week.  Patient states that his sugars have been running in the 120s and that his A1c is around 6%. Patient had no other complaints.  No feelings of malaise.  No fevers or chills.  No cough no congestion.  He is on chronic anticoagulation for A. Fib  Metformin for DM   + diabetic peripheral neuropathy and chronic venous insufficiency   Past Medical History:  Diagnosis Date  . A-fib ([place])   . Allergy to pollen 04/06/2015  . Anxiety 02/06/2017  . Arthritis 02/06/2017  . Bee sting allergy    wasp / mixed vespid  . Bell's palsy 02/24/2013  . Chronic venous insufficiency 12/14/2014  . Chronic, continuous use of opioids 02/15/2020  . Diabetes ([place])   . Edema 12/14/2014  . Hyperlipidemia, mixed  12/14/2014  . Hypertension   . Lumbar pain with radiation down left leg 07/09/2012  . Morbid obesity ([place]) 12/14/2014  . Morbid obesity with BMI of 45.0-49.9, adult ([place]) 07/09/2012  . Obesity hypoventilation syndrome ([place]) 12/14/2014  . OSA treated with BiPAP 12/14/2014  . Pain syndrome, chronic 07/09/2012  . Pernicious anemia 02/06/2017  . Polyneuropathy in diabetes ([place]) 02/24/2013  . Syndrome affecting cervical region 02/24/2013  . Trigeminal neuralgia 02/24/2013  . Type 2 diabetes mellitus with diabetic neuropathy ([place]) 12/14/2014  . Vitamin D deficiency 02/16/2020    Past Surgical History:  Procedure Laterality Date  . ADENOIDECTOMY    . BACK SURGERY  2002 /2003  . CARPAL TUNNEL RELEASE Bilateral   . I & D EXTREMITY Left 01/31/2020   Procedure: IRRIGATION AND DEBRIDEMENT ANKLE;  Surgeon: Altamese Danbury, MD;  Location: [facility];  Service: Orthopedics;  Laterality: Left;  . KNEE SURGERY Left 2005  . ORIF ANKLE FRACTURE Left 01/31/2020  . ORIF ANKLE FRACTURE Left 01/31/2020   Procedure: OPEN REDUCTION INTERNAL FIXATION (ORIF) ANKLE FRACTURE;  Surgeon: Altamese Holly Pond, MD;  Location: [facility];  Service: Orthopedics;  Laterality: Left;  . SHOULDER SURGERY Left 1998  . TONSILLECTOMY      Family History  Problem Relation Age of Onset  . Breast cancer Mother   . Heart disease Father   . CAD Father   . Atrial fibrillation Father   .  Stomach cancer Paternal Aunt   . Heart disease Paternal Uncle   . Breast cancer Maternal Grandmother     Social History:  reports that he quit smoking about 13 years ago. His smoking use included cigarettes. He has a 105.00 pack-year smoking history. He has never used smokeless tobacco. He reports that he does not drink alcohol and does not use drugs.  Allergies:  Allergies  Allergen Reactions  . Penicillins Swelling    CEPHALOSPORIN TOLERANT 01/31/2020  . Aspirin     Relative contraindication due to hematochezia while on it  . Bee Venom     Medications: I have  reviewed the patient's current medications. Current Outpatient Medications  Medication Instructions  . acetaminophen (TYLENOL) 325-650 mg, Oral, Every 6 hours PRN  . ascorbic acid (VITAMIN C) 1,000 mg, Oral, Daily  . Cholecalciferol (VITAMIN D) 125 MCG (5000 UT) CAPS 1 capsule, Oral, Daily  . cyanocobalamin 1,000 mcg, Oral, Daily  . dicyclomine (BENTYL) 20 MG tablet 1 tablet, Oral, 2 times daily  . docusate sodium (COLACE) 100 mg, Oral, 2 times daily  . DULoxetine (CYMBALTA) 60 mg, Oral, Daily  . ELIQUIS 5 MG TABS tablet TAKE 1 TABLET BY MOUTH TWICE DAILY  . EPINEPHrine 0.3 mg/0.3 mL IJ SOAJ injection Use for life-threatening allergic reactions  . furosemide (LASIX) 40 mg, Oral, Daily PRN  . gabapentin (NEURONTIN) 600 mg, Oral, 3 times daily  . metFORMIN (GLUCOPHAGE) 500 mg, Oral, Daily with breakfast  . metoprolol tartrate (LOPRESSOR) 50 mg, Oral, Daily  . metoprolol tartrate (LOPRESSOR) 25 mg, Oral, Daily at bedtime  . MIXED VESPID VENOM PROTEIN [place] Subcutaneous  . naloxone (NARCAN) nasal spray 4 mg/0.1 mL 1 spray, Nasal, See admin instructions, USE 1 SPRAY NASALLY AS NEEDED FOR OPIOID OVERDOSE EMERGENCY   . naproxen sodium (ALEVE) 440 mg, Oral, Daily PRN  . nitroGLYCERIN (NITROSTAT) 0.4 mg, Sublingual, Every 5 min PRN, Reported on 04/26/2015  . ondansetron (ZOFRAN-ODT) 4 mg, Oral, Every 8 hours PRN  . oxyCODONE-acetaminophen (PERCOCET) 7.5-325 MG tablet 1 tablet, Oral, Every 8 hours PRN  . pantoprazole (PROTONIX) 40 mg, Oral, Daily  . simvastatin (ZOCOR) 20 mg, Oral, Daily at bedtime  . tamsulosin (FLOMAX) 0.4 mg, Oral, Daily at bedtime  . traMADol (ULTRAM) 50 mg, Oral, Every 6 hours PRN  . VENOMIL WASP VENOM IJ Injection     No results found for this or any previous visit (from the past 66 hour(s)).  No results found.  Intake/Output    None      Review of Systems  Constitutional: Negative for chills and fever.  HENT: Negative for congestion and sore throat.   Eyes:  Negative for blurred vision.  Respiratory: Negative for shortness of breath and wheezing.   Cardiovascular: Negative for chest pain and palpitations.  Gastrointestinal: Negative for abdominal pain, nausea and vomiting.  Genitourinary: Negative for dysuria.  Skin: Negative for rash.   There were no vitals taken for this visit. Physical Exam Constitutional:      General: He is not in acute distress.    Appearance: He is obese.  HENT:     Head: Normocephalic and atraumatic.     Mouth/Throat:     Mouth: Mucous membranes are moist.  Eyes:     Extraocular Movements: Extraocular movements intact.  Cardiovascular:     Heart sounds: S1 normal.  Pulmonary:     Comments: Unlabored Musculoskeletal:     Comments: Left Lower Extremity  Moderate swelling L ankle Medial traumatic wound with erythema,  no purulence  + maceration medial ankle and heel Tibiotalocalcaneal pinsites are macerated but pins still visible Lateral wound looks fantastic  Moderate swelling L foot and ankle 1+ DP pulse No DCT  Distal motor and sensory functions at baseline   Neurological:     Mental Status: He is alert and oriented to person, place, and time.     Comments: Using knee scooter to mobilize as he is NWB on left leg   Psychiatric:        Attention and Perception: Attention and perception normal.        Mood and Affect: Mood and affect normal.     Assessment/Plan:  60 y/o male 4 weeks s/p ORIF Open L trimalleolar ankle frature dislocation with deep infection of traumatic medial wound, DM, Afib on chronic anticoagulation, chronic pain on chronic opioid treatment   -open L trimalleolar ankle fracture dislocation with s/p ORIF 4 weeks ago with deep infection of traumatic medial wound  Admit for IV abx  Cultures obtained in the office    No frank purulence encountered   Possible OR tomorrow vs Friday for I&D   Anticipate retention of lateral hardware as does not appear to communicate to lateral side   No  hardware medially   NWB L leg  Night splint L leg to provide some external support but allow for easy soft tissue access   - Pain management:  Titrate accordingly    - ABL anemia/Hemodynamics  CBC with diff   - Medical issues   Chronic anticoagulation--> Afib  Chronic opioid use from chronic pain management   Obesity   DM   - DVT/PE prophylaxis:  Continue eliquis   - ID:   Start ceftriaxone for more broad spectrum coverage given totality of clinical picture    Hold on ID consult   - Activity:  NWB L LEx  - FEN/GI prophylaxis/Foley/Lines:  NPO after MN   Carb mod diet for now   NSL   - Dispo:  Admit for IV abx   Will see how he responds to IV abx     Jari Pigg, PA-C [phone] (C) 03/08/2020, 3:44 PM  [facility] [place] [phone] Domingo Sep (F)    After 5pm and on the weekends please log on to Amion, go to orthopaedics and the look under the [facility] Call for the provider(s) on call. You can also call our office at [phone] and then follow the prompts to be connected to the call team.

## 2020-03-08 NOTE — Progress Notes (Signed)
Orthopedic Tech Progress Note Patient Details:  DIXIE JAFRI 1960/09/06 [id number] Night Splint has been ordered  Patient ID: [name], male   DOB: August 05, 1960, 60 y.o.   MRN: [id number]   Jearld Lesch 03/08/2020, 6:24 PM

## 2020-03-09 ENCOUNTER — Encounter (HOSPITAL_COMMUNITY)
Admission: AD | Disposition: A | Discharge status: Home / Self Care | Payer: Self-pay | Source: Home / Self Care | Attending: Orthopedic Surgery

## 2020-03-09 ENCOUNTER — Encounter (HOSPITAL_COMMUNITY): Payer: Self-pay | Admitting: Orthopedic Surgery

## 2020-03-09 ENCOUNTER — Encounter (HOSPITAL_COMMUNITY): Payer: Self-pay | Admitting: Certified Registered Nurse Anesthetist

## 2020-03-09 ENCOUNTER — Other Ambulatory Visit: Payer: Self-pay | Admitting: Physician Assistant

## 2020-03-09 ENCOUNTER — Inpatient Hospital Stay (HOSPITAL_COMMUNITY): Admission: RE | Admit: 2020-03-09 | Payer: Medicare HMO | Source: Home / Self Care | Admitting: Orthopedic Surgery

## 2020-03-09 ENCOUNTER — Other Ambulatory Visit: Payer: Self-pay

## 2020-03-09 DIAGNOSIS — G4733 Obstructive sleep apnea (adult) (pediatric): Secondary | ICD-10-CM

## 2020-03-09 DIAGNOSIS — T8130XA Disruption of wound, unspecified, initial encounter: Principal | ICD-10-CM

## 2020-03-09 DIAGNOSIS — L97328 Non-pressure chronic ulcer of left ankle with other specified severity: Secondary | ICD-10-CM

## 2020-03-09 HISTORY — DX: Obstructive sleep apnea (adult) (pediatric): G47.33

## 2020-03-09 LAB — SURGICAL PCR SCREEN
MRSA, PCR: NEGATIVE
Staphylococcus aureus: NEGATIVE

## 2020-03-09 LAB — GLUCOSE, CAPILLARY
Glucose-Capillary: 122 mg/dL — ABNORMAL HIGH (ref 70–99)
Glucose-Capillary: 115 mg/dL — ABNORMAL HIGH (ref 70–99)
Glucose-Capillary: 108 mg/dL — ABNORMAL HIGH (ref 70–99)

## 2020-03-09 SURGERY — IRRIGATION AND DEBRIDEMENT EXTREMITY
Anesthesia: Choice | Laterality: Left

## 2020-03-09 MED ORDER — DEXTROSE 5 % IV SOLN
3.0000 g | INTRAVENOUS | Status: AC
  Administered 2020-03-10: 3 g via INTRAVENOUS
  Filled 2020-03-09: qty 3000

## 2020-03-09 MED ORDER — POVIDONE-IODINE 10 % EX SWAB: 2.0000 "application " | CUTANEOUS | Status: DC

## 2020-03-09 MED ORDER — TRAMADOL HCL 50 MG PO TABS: 50.0000 mg | ORAL_TABLET | Freq: Four times a day (QID) | ORAL | Status: DC | PRN

## 2020-03-09 MED ORDER — LACTATED RINGERS IV SOLN: INTRAVENOUS | Status: DC

## 2020-03-09 MED ORDER — CHLORHEXIDINE GLUCONATE 4 % EX LIQD: 60.0000 mL | Freq: Once | CUTANEOUS | Status: DC

## 2020-03-09 MED ORDER — MUPIROCIN 2 % EX OINT: 1.0000 "application " | TOPICAL_OINTMENT | Freq: Two times a day (BID) | CUTANEOUS | Status: DC

## 2020-03-09 NOTE — Patient Outreach (Signed)
[facility]) Care Management  03/09/2020  David Fitzgerald [birth year] [id number]   Referral Date: 03/09/20 Referral Source: [employer] Report Date of Discharge: 03/08/20 Facility:  [place]: [facility]   Referral received.  No outreach warranted at this time.  Patient currently admitted to hospital.   Plan: RN CM will close case.    [name], RN, MSN [facility] Care Management Care Management Coordinator Direct Line [phone] Toll Free: [phone]  Fax: [phone]

## 2020-03-09 NOTE — TOC Initial Note (Signed)
Transition of Care [facility]) - Initial/Assessment Note    Patient Details  Name: David Fitzgerald MRN: [id number] Date of Birth: 04/19/1960  Transition of Care [facility]) CM/SW Contact:    Sharin Mons, RN Phone Number: 03/09/2020, 1:51 PM  Clinical Narrative:                 Readmitted with dehiscence traumatic wound medial malleolus left ankle. Hx of ORIF Open L trimalleolar ankle frature dislocation 4 weeks ago, DM, Afib on chronic anticoagulation, chronic pain. Pt d/c to [facility] with previous admit. However with present admit pt would like to d/c to home. Agreeable to [facility] services. Preference : [facility].  States lives alone. Mom lives next door. DME: scooter, walker , cane , BSC.  Plan: repeat debridement of the medial malleolus with partial bone excision placement of tissue graft and a wound VAC per ortho  TOC team following and will assist with TOC needs.....  Expected Discharge Plan: [place] (vs [facility]) Barriers to Discharge: Continued Medical Work up   Patient Goals and CMS Choice Patient states their goals for this hospitalization and ongoing recovery are:: To get better and go home   Choice offered to / list presented to : Patient  Expected Discharge Plan and Services Expected Discharge Plan: [place] (vs [facility])   Discharge Planning Services: CM Consult   Living arrangements for the past 2 months: Single Family Home                                      Prior Living Arrangements/Services Living arrangements for the past 2 months: Single Family Home Lives with:: Self (mom lives next door) Patient language and need for interpreter reviewed:: Yes Do you feel safe going back to the place where you live?: Yes      Need for Family Participation in Patient Care: Yes (Comment) Care giver support system in place?: Yes (comment)   Criminal Activity/Legal Involvement Pertinent to Current Situation/Hospitalization:  No - Comment as needed  Activities of Daily Living      Permission Sought/Granted   Permission granted to share information with : Yes, Verbal Permission Granted  Share Information with NAME: David Fitzgerald (Mother)   [phone]           Emotional Assessment Appearance:: Appears stated age Attitude/Demeanor/Rapport: Gracious Affect (typically observed): Accepting Orientation: : Oriented to Self,Oriented to Place,Oriented to  Time,Oriented to Situation Alcohol / Substance Use: Not Applicable Psych Involvement: No (comment)  Admission diagnosis:  OSTEOMYOLYTIS LEFT ANKLE Patient Active Problem List   Diagnosis Date Noted  . OSA (obstructive sleep apnea) 03/09/2020  . Wound dehiscence, traumatic injury repair, left medial ankle  03/08/2020  . Vitamin D deficiency 02/16/2020  . Chronic, continuous use of opioids 02/15/2020  . Open fracture dislocation of left ankle 01/31/2020  . Ankle fracture 01/31/2020  . Permanent atrial fibrillation ([place])   . Hypertension   . Diabetes ([place])   . Bee sting allergy   . Hypotension 12/25/2017  . Chronic anticoagulation 03/07/2017  . Coronary artery calcification seen on CT scan 02/07/2017  . CAD in native artery 02/07/2017  . Persistent atrial fibrillation ([place]) 02/06/2017  . Anxiety 02/06/2017  . Arthritis 02/06/2017  . Pernicious anemia 02/06/2017  . Allergy to pollen 04/06/2015  . OSA treated with BiPAP 12/14/2014  . Obesity hypoventilation syndrome ([place]) 12/14/2014  . Morbid obesity  with BMI of 40.0-44.9, adult ([place]) 12/14/2014  . Hypertensive heart disease 12/14/2014  . Hyperlipidemia, mixed 12/14/2014  . Type 2 diabetes mellitus with diabetic neuropathy ([place]) 12/14/2014  . Chronic venous insufficiency 12/14/2014  . Edema 12/14/2014  . Morbid obesity ([place]) 12/14/2014  . Bell's palsy 02/24/2013  . Polyneuropathy in diabetes ([place]) 02/24/2013  . Syndrome affecting cervical region 02/24/2013  . Trigeminal neuralgia 02/24/2013   . Lumbar pain with radiation down left leg 07/09/2012  . BMI 45.0-49.9, adult ([place]) 07/09/2012  . Chronic pain syndrome 07/09/2012  . Morbid obesity with BMI of 45.0-49.9, adult ([place]) 07/09/2012   PCP:  Angelina Sheriff, MD Pharmacy:  No Pharmacies Listed    Social Determinants of Health (SDOH) Interventions    Readmission Risk Interventions Readmission Risk Prevention Plan 02/02/2020  Post Dischage Appt Complete  Medication Screening Complete  Transportation Screening Complete  Some recent data might be hidden

## 2020-03-09 NOTE — Plan of Care (Signed)
  Problem: Coping: Goal: Level of anxiety will decrease Outcome: Progressing   Problem: Nutrition: Goal: Adequate nutrition will be maintained Outcome: Progressing   Problem: Safety: Goal: Ability to remain free from injury will improve Outcome: Progressing   

## 2020-03-09 NOTE — Progress Notes (Signed)
Inpatient Diabetes Program Recommendations  AACE/ADA: New Consensus Statement on Inpatient Glycemic Control (2015)  Target Ranges:  Prepandial:   less than 140 mg/dL      Peak postprandial:   less than 180 mg/dL (1-2 hours)      Critically ill patients:  140 - 180 mg/dL   Lab Results  Component Value Date   GLUCAP 115 (H) 03/09/2020   HGBA1C 6.0 (H) 03/08/2020    Review of Glycemic Control Results for David Fitzgerald, David Fitzgerald (MRN [id number]) as of 03/09/2020 11:38  Ref. Range 03/08/2020 17:28 03/08/2020 20:07 03/09/2020 06:35 03/09/2020 11:34  Glucose-Capillary Latest Ref Range: 70 - 99 mg/dL 96 87 122 (H) 115 (H)   Diabetes history: Type 2 DM Outpatient Diabetes medications:  Metformin 500 mg daily Current orders for Inpatient glycemic control:  Novolog moderate tid with meals  Inpatient Diabetes Program Recommendations:   Referral received. Agree with current orders.   Thanks  Adah Perl, RN, BC-ADM Inpatient Diabetes Coordinator Pager [phone] (8a-5p)

## 2020-03-09 NOTE — Discharge Instructions (Signed)

## 2020-03-09 NOTE — H&P (View-Only) (Signed)
ORTHOPAEDIC CONSULTATION  REQUESTING PHYSICIAN: Altamese [place], MD  Chief Complaint: Dehiscence traumatic wound medial malleolus left ankle.  HPI: David Fitzgerald is a 60 y.o. male who presents with dehiscence of the traumatic wound medial malleolus left ankle.  Patient sustained an open type IIIb contaminated medial malleolar fracture of the left ankle on December 26.  He underwent initial aggressive surgical debridement on December 27.  As well as open reduction internal fixation of the ankle fracture dislocation.  In follow-up patient had developed dehiscence of the traumatic medial malleolar wound.  Patient was brought to the operating room yesterday for debridement and is seen in consultation for limb salvage.  Patient has a history of type 2 diabetes that he states is well controlled he has been prescribed a CPAP machine but does not use it.  Patient does not smoke.  Past Medical History:  Diagnosis Date  . A-fib ([place])   . Allergy to pollen 04/06/2015  . Anxiety 02/06/2017  . Arthritis 02/06/2017  . Bee sting allergy    wasp / mixed vespid  . Bell's palsy 02/24/2013  . Chronic venous insufficiency 12/14/2014  . Chronic, continuous use of opioids 02/15/2020  . Diabetes ([place])   . Edema 12/14/2014  . Hyperlipidemia, mixed 12/14/2014  . Hypertension   . Lumbar pain with radiation down left leg 07/09/2012  . Morbid obesity ([place]) 12/14/2014  . Morbid obesity with BMI of 45.0-49.9, adult ([place]) 07/09/2012  . Obesity hypoventilation syndrome ([place]) 12/14/2014  . OSA treated with BiPAP 12/14/2014  . Pain syndrome, chronic 07/09/2012  . Pernicious anemia 02/06/2017  . Polyneuropathy in diabetes ([place]) 02/24/2013  . Syndrome affecting cervical region 02/24/2013  . Trigeminal neuralgia 02/24/2013  . Type 2 diabetes mellitus with diabetic neuropathy ([place]) 12/14/2014  . Vitamin D deficiency 02/16/2020   Past Surgical History:  Procedure Laterality Date  . ADENOIDECTOMY    . BACK SURGERY  2002 /2003  . CARPAL  TUNNEL RELEASE Bilateral   . I & D EXTREMITY Left 01/31/2020   Procedure: IRRIGATION AND DEBRIDEMENT ANKLE;  Surgeon: Altamese [place], MD;  Location: [facility];  Service: Orthopedics;  Laterality: Left;  . KNEE SURGERY Left 2005  . ORIF ANKLE FRACTURE Left 01/31/2020  . ORIF ANKLE FRACTURE Left 01/31/2020   Procedure: OPEN REDUCTION INTERNAL FIXATION (ORIF) ANKLE FRACTURE;  Surgeon: Altamese [place], MD;  Location: [facility];  Service: Orthopedics;  Laterality: Left;  . SHOULDER SURGERY Left 1998  . TONSILLECTOMY     Social History   Socioeconomic History  . Marital status: Married    Spouse name: Not on file  . Number of children: Not on file  . Years of education: Not on file  . Highest education level: Not on file  Occupational History  . Not on file  Tobacco Use  . Smoking status: Former Smoker    Packs/day: 3.00    Years: 35.00    Pack years: 105.00    Types: Cigarettes    Quit date: 04/13/2006    Years since quitting: 13.9  . Smokeless tobacco: Never Used  Vaping Use  . Vaping Use: Never used  Substance and Sexual Activity  . Alcohol use: No    Alcohol/week: 0.0 standard drinks  . Drug use: No  . Sexual activity: Not on file  Other Topics Concern  . Not on file  Social History Narrative  . Not on file   Social Determinants of Health   Financial Resource Strain: Not on file  Food Insecurity: Not  on file  Transportation Needs: Not on file  Physical Activity: Not on file  Stress: Not on file  Social Connections: Not on file   Family History  Problem Relation Age of Onset  . Breast cancer Mother   . Heart disease Father   . CAD Father   . Atrial fibrillation Father   . Stomach cancer Paternal Aunt   . Heart disease Paternal Uncle   . Breast cancer Maternal Grandmother    - negative except otherwise stated in the family history section Allergies  Allergen Reactions  . Penicillins Swelling and Other (See Comments)    CEPHALOSPORIN TOLERANT 01/31/2020- "Allergic,"  per paperwork from facility  . Aspirin Other (See Comments)    Relative contraindication due to hematochezia while on it- "Allergic," per paperwork from facility  . Bee Venom Other (See Comments)   Prior to Admission medications   Medication Sig Start Date End Date Taking? Authorizing Provider  acetaminophen (TYLENOL) 325 MG tablet Take 1-2 tablets (325-650 mg total) by mouth every 6 (six) hours as needed for mild pain (pain score 1-3 or temp > 100.5). Patient taking differently: Take 650 mg by mouth every 6 (six) hours as needed for mild pain (or a temp of 100.5 F). 02/15/20  Yes Ainsley Spinner, PA-C  ascorbic acid (VITAMIN C) 1000 MG tablet Take 1 tablet (1,000 mg total) by mouth daily. 02/15/20 04/15/20 Yes Ainsley Spinner, PA-C  Azelastine-Fluticasone 137-50 MCG/ACT SUSP Place 2 sprays into both nostrils 2 (two) times daily.   Yes [provider]  Cholecalciferol (VITAMIN D) 125 MCG (5000 UT) CAPS Take 1 capsule by mouth daily. Patient taking differently: Take 5,000 Units by mouth daily. 02/15/20  Yes Ainsley Spinner, PA-C  dicyclomine (BENTYL) 20 MG tablet Take 20 mg by mouth 2 (two) times daily. 11/27/17  Yes [provider]  docusate sodium (COLACE) 100 MG capsule Take 1 capsule (100 mg total) by mouth 2 (two) times daily. 02/15/20  Yes Ainsley Spinner, PA-C  DULoxetine (CYMBALTA) 60 MG capsule Take 60 mg by mouth daily.  01/29/17  Yes [provider]  ELIQUIS 5 MG TABS tablet TAKE 1 TABLET BY MOUTH TWICE DAILY Patient taking differently: Take 5 mg by mouth 2 (two) times daily. 10/26/19  Yes Richardo Priest, MD  EPINEPHrine 0.3 mg/0.3 mL IJ SOAJ injection Use for life-threatening allergic reactions Patient taking differently: Inject 0.3 mg into the muscle as needed (for life-threatening allergic reactions). 07/23/18  Yes Kozlow, Donnamarie Poag, MD  furosemide (LASIX) 40 MG tablet Take 40 mg by mouth daily as needed for fluid or edema.   Yes [provider]  gabapentin (NEURONTIN)  300 MG capsule Take 600 mg by mouth 3 (three) times daily.    Yes [provider]  metFORMIN (GLUCOPHAGE) 500 MG tablet Take 500 mg by mouth daily with breakfast.   Yes [provider]  metoprolol tartrate (LOPRESSOR) 25 MG tablet Take 1 tablet (25 mg total) by mouth at bedtime. 02/11/20  Yes Hongalgi, Lenis Dickinson, MD  metoprolol tartrate (LOPRESSOR) 50 MG tablet Take 1 tablet (50 mg total) by mouth daily. Patient taking differently: Take 50 mg by mouth in the morning. 02/12/20  Yes Hongalgi, Lenis Dickinson, MD  naloxone [facility]) nasal spray 4 mg/0.1 mL Place 1 spray into the nose See admin instructions. USE 1 SPRAY NASALLY AS NEEDED FOR OPIOID OVERDOSE EMERGENCY   Yes [provider]  naproxen sodium (ALEVE) 220 MG tablet Take 440 mg by mouth daily as needed (for  headaches).   Yes [provider]  nitroGLYCERIN (NITROSTAT) 0.4 MG SL tablet Place 0.4 mg under the tongue every 5 (five) minutes as needed for chest pain. 02/01/14  Yes [provider]  ondansetron (ZOFRAN-ODT) 4 MG disintegrating tablet Take 4 mg by mouth every 8 (eight) hours as needed for nausea or vomiting.   Yes [provider]  oxyCODONE-acetaminophen (PERCOCET) 7.5-325 MG tablet Take 1 tablet by mouth every 8 (eight) hours as needed for severe pain.   Yes [provider]  pantoprazole (PROTONIX) 40 MG tablet Take 40 mg by mouth daily. 08/31/19  Yes [provider]  simvastatin (ZOCOR) 20 MG tablet Take 20 mg by mouth at bedtime.   Yes [provider]  tamsulosin (FLOMAX) 0.4 MG CAPS capsule Take 0.4 mg by mouth at bedtime.   Yes [provider]  traMADol (ULTRAM) 50 MG tablet Take 1 tablet (50 mg total) by mouth every 6 (six) hours as needed for moderate pain or severe pain. 02/15/20  Yes Ainsley Spinner, PA-C  vitamin B-12 1000 MCG tablet Take 1 tablet (1,000 mcg total) by mouth daily. 02/12/20  Yes Hongalgi, Lenis Dickinson, MD   No results found. - pertinent xrays, CT,  MRI studies were reviewed and independently interpreted  Positive ROS: All other systems have been reviewed and were otherwise negative with the exception of those mentioned in the HPI and as above.  Physical Exam: General: Alert, no acute distress Psychiatric: Patient is competent for consent with normal mood and affect Lymphatic: No axillary or cervical lymphadenopathy Cardiovascular: No pedal edema Respiratory: No cyanosis, no use of accessory musculature GI: No organomegaly, abdomen is soft and non-tender    Images:  @ENCIMAGES @  Labs:  Lab Results  Component Value Date   HGBA1C 6.0 (H) 03/08/2020   HGBA1C 6.0 (H) 02/04/2020    Lab Results  Component Value Date   ALBUMIN 2.9 (L) 03/08/2020   ALBUMIN 2.9 (L) 02/16/2020   ALBUMIN 3.2 (L) 02/04/2020    Neurologic: Patient does not have protective sensation bilateral lower extremities.   MUSCULOSKELETAL:   Skin: Examination there is maceration and wound dehiscence over the medial malleolus left ankle.  Patient has strong palpable pulses on the right left foot the pulses are palpable but not as strong secondary to the swelling.  Radiographs shows excellent reduction of the ankle fracture dislocation.  Hemoglobin A1c 6.0 albumin 2.9  Patient has significant venous insufficiency swelling in the left lower extremity.  Assessment: Assessment diabetic insensate neuropathy with wound dehiscence of the medial malleolus left ankle status post open type IIIb contaminated ankle fracture dislocation.  Plan: Plan: Will plan for repeat debridement of the medial malleolus with partial bone excision placement of tissue graft and a wound VAC.  Risks and benefits were discussed including need for additional surgery.  Patient states he understands wished to proceed at this time.  Thank you for the consult and the opportunity to see Mr. Benn Tarver, [place] [phone] 6:47 AM

## 2020-03-09 NOTE — Progress Notes (Signed)
Orthopaedic Trauma Service Progress Note  Patient ID: David Fitzgerald MRN: [id number] DOB/AGE: March 31, 1960 60 y.o.  Subjective:  Doing ok  Resting   No acute issues   OR tomorrow with Dr. Sharol Given   Does not [place] CPAP  On IV rocephin  Mild elevation of Cr   ROS As above  Objective:   VITALS:   Vitals:   03/08/20 2100 03/08/20 2135 03/09/20 0338 03/09/20 0755  BP:  (!) 145/64 (!) 129/55 (!) 125/53  Pulse:  (!) 103 98 80  Resp:  20 20 17   Temp:  97.9 F (36.6 C) 98.4 F (36.9 C) 98 F (36.7 C)  TempSrc:  Oral Oral Oral  SpO2:  (!) 89% (!) 85% 90%  Weight: (!) 162.8 kg     Height: 6\' 2"  (1.88 m)       Estimated body mass index is 46.08 kg/m as calculated from the following:   Height as of this encounter: 6\' 2"  (1.88 m).   Weight as of this encounter: 162.8 kg.   Intake/Output      02/02 0701 02/03 0700 02/03 0701 02/04 0700   IV Piggyback 100    Total Intake(mL/kg) 100 (0.6)    Net +100           LABS  Results for orders placed or performed during the hospital encounter of 03/08/20 (from the past 24 hour(s))  SARS CORONAVIRUS 2 (TAT 6-24 HRS) Nasopharyngeal Nasopharyngeal Swab     Status: None   Collection Time: 03/08/20  5:19 PM   Specimen: Nasopharyngeal Swab  Result Value Ref Range   SARS Coronavirus 2 NEGATIVE NEGATIVE  Glucose, capillary     Status: None   Collection Time: 03/08/20  5:28 PM  Result Value Ref Range   Glucose-Capillary 96 70 - 99 mg/dL  HIV Antibody (routine testing w rflx)     Status: None   Collection Time: 03/08/20  6:08 PM  Result Value Ref Range   HIV Screen 4th Generation wRfx Non Reactive Non Reactive  Comprehensive metabolic panel     Status: Abnormal   Collection Time: 03/08/20  6:08 PM  Result Value Ref Range   Sodium 137 135 - 145 mmol/L   Potassium 4.1 3.5 - 5.1 mmol/L   Chloride 100 98 - 111 mmol/L   CO2 25 22 - 32 mmol/L   Glucose, Bld 96 70  - 99 mg/dL   BUN 13 6 - 20 mg/dL   Creatinine, Ser 1.35 (H) 0.61 - 1.24 mg/dL   Calcium 8.8 (L) 8.9 - 10.3 mg/dL   Total Protein 7.7 6.5 - 8.1 g/dL   Albumin 2.9 (L) 3.5 - 5.0 g/dL   AST 20 15 - 41 U/L   ALT 16 0 - 44 U/L   Alkaline Phosphatase 76 38 - 126 U/L   Total Bilirubin 0.8 0.3 - 1.2 mg/dL   GFR, Estimated >60 >60 mL/min   Anion gap 12 5 - 15  Protime-INR     Status: Abnormal   Collection Time: 03/08/20  6:08 PM  Result Value Ref Range   Prothrombin Time 16.3 (H) 11.4 - 15.2 seconds   INR 1.4 (H) 0.8 - 1.2  CBC with Differential/Platelet     Status: Abnormal   Collection Time: 03/08/20  6:08 PM  Result Value Ref Range  WBC 10.6 (H) 4.0 - 10.5 K/uL   RBC 4.50 4.22 - 5.81 MIL/uL   Hemoglobin 12.7 (L) 13.0 - 17.0 g/dL   HCT 42.0 39.0 - 52.0 %   MCV 93.3 80.0 - 100.0 fL   MCH 28.2 26.0 - 34.0 pg   MCHC 30.2 30.0 - 36.0 g/dL   RDW 14.5 11.5 - 15.5 %   Platelets 460 (H) 150 - 400 K/uL   nRBC 0.0 0.0 - 0.2 %   Neutrophils Relative % 71 %   Neutro Abs 7.7 1.7 - 7.7 K/uL   Lymphocytes Relative 14 %   Lymphs Abs 1.5 0.7 - 4.0 K/uL   Monocytes Relative 9 %   Monocytes Absolute 0.9 0.1 - 1.0 K/uL   Eosinophils Relative 3 %   Eosinophils Absolute 0.3 0.0 - 0.5 K/uL   Basophils Relative 1 %   Basophils Absolute 0.1 0.0 - 0.1 K/uL   Immature Granulocytes 2 %   Abs Immature Granulocytes 0.16 (H) 0.00 - 0.07 K/uL  VITAMIN D 25 Hydroxy (Vit-D Deficiency, Fractures)     Status: Abnormal   Collection Time: 03/08/20  6:08 PM  Result Value Ref Range   Vit D, 25-Hydroxy 19.57 (L) 30 - 100 ng/mL  Hemoglobin A1c     Status: Abnormal   Collection Time: 03/08/20  6:08 PM  Result Value Ref Range   Hgb A1c MFr Bld 6.0 (H) 4.8 - 5.6 %   Mean Plasma Glucose 125.5 mg/dL  Glucose, capillary     Status: None   Collection Time: 03/08/20  8:07 PM  Result Value Ref Range   Glucose-Capillary 87 70 - 99 mg/dL  Glucose, capillary     Status: Abnormal   Collection Time: 03/09/20  6:35 AM   Result Value Ref Range   Glucose-Capillary 122 (H) 70 - 99 mg/dL   Comment 1 Notify RN      PHYSICAL EXAM:   Gen: resting comfortably in bed, morbidly obese  Ext:       Left Lower Extremity   Dressing stable  Diminished sensation throughout, symmetric to contralateral leg   + DP pulse  Moderate swelling  No DCT   + chronic venous insufficiency     Assessment/Plan:     Principal Problem:   Wound dehiscence, traumatic injury repair, left medial ankle  Active Problems:   Obesity hypoventilation syndrome (HCC)   Type 2 diabetes mellitus with diabetic neuropathy (H[facility])   Chronic pain syndrome   Polyneuropathy in diabetes ([facility])   Chronic anticoagulation   Morbid obesity with BMI of 45.0-49.9, adult ([place])   Hypertension   Diabetes ([place])   Open fracture dislocation of left ankle   Permanent atrial fibrillation (HCC)   Chronic, continuous use of opioids   Vitamin D deficiency   OSA (obstructive sleep apnea)   Anti-infectives (From admission, onward)   Start     Dose/Rate Route Frequency Ordered Stop   03/08/20 1800  cefTRIAXone (ROCEPHIN) 2 g in sodium chloride 0.9 % 100 mL IVPB        2 g 200 mL/hr over 30 Minutes Intravenous Every 24 hours 03/08/20 1709      .  POD/HD#: 1  60 y/o male 5 weeks s/p I&D and ORIF open left ankle fracture dislocation admitted for traumatic wound breakdown in setting of diabetes   -traumatic wound dehiscence of medial ankle s/p ORIF L ankle fracture dislocation   OR tomorrow with Dr. Sharol Given  Continue NWB L leg for another 3 weeks  Night splint  Further recs per Dr. Sharol Given  - Pain management:  Chronic pain management   Continue with current regimen   - ABL anemia/Hemodynamics  Cr slightly elevated this am   Will start some IVF  - Medical issues   Numerous chronic medical issues   Home meds   DM   On SSI currently    Have home metformin held   - DVT/PE prophylaxis:  On eliquis   - ID:   Rocephin   - Metabolic Bone  Disease:  Vitamin d deficiency    Slightly improved from last admission    Continue current supplementation   - Activity:  As tolerated but NWB L leg  - FEN/GI prophylaxis/Foley/Lines:  Carb mod diet  NPO after MN   - Impediments to fracture healing:  Open fx  DM  Numerous chronic medical issues  - Dispo:  OR tomorrow with Dr. Annice Needy, PA-C [phone] (C) 03/09/2020, 11:07 AM  [facility] [place] [phone] Jenetta [phone] (F)    After 5pm and on the weekends please log on to Amion, go to orthopaedics and the look under the [facility] Call for the provider(s) on call. You can also call our office at [phone] and then follow the prompts to be connected to the call team.

## 2020-03-09 NOTE — Consult Note (Signed)
ORTHOPAEDIC CONSULTATION  REQUESTING PHYSICIAN: Altamese Holt, MD  Chief Complaint: Dehiscence traumatic wound medial malleolus left ankle.  HPI: David Fitzgerald is a 60 y.o. male who presents with dehiscence of the traumatic wound medial malleolus left ankle.  Patient sustained an open type IIIb contaminated medial malleolar fracture of the left ankle on December 26.  He underwent initial aggressive surgical debridement on December 27.  As well as open reduction internal fixation of the ankle fracture dislocation.  In follow-up patient had developed dehiscence of the traumatic medial malleolar wound.  Patient was brought to the operating room yesterday for debridement and is seen in consultation for limb salvage.  Patient has a history of type 2 diabetes that he states is well controlled he has been prescribed a CPAP machine but does not use it.  Patient does not smoke.  Past Medical History:  Diagnosis Date  . A-fib ([place])   . Allergy to pollen 04/06/2015  . Anxiety 02/06/2017  . Arthritis 02/06/2017  . Bee sting allergy    wasp / mixed vespid  . Bell's palsy 02/24/2013  . Chronic venous insufficiency 12/14/2014  . Chronic, continuous use of opioids 02/15/2020  . Diabetes ([place])   . Edema 12/14/2014  . Hyperlipidemia, mixed 12/14/2014  . Hypertension   . Lumbar pain with radiation down left leg 07/09/2012  . Morbid obesity ([place]) 12/14/2014  . Morbid obesity with BMI of 45.0-49.9, adult ([place]) 07/09/2012  . Obesity hypoventilation syndrome ([place]) 12/14/2014  . OSA treated with BiPAP 12/14/2014  . Pain syndrome, chronic 07/09/2012  . Pernicious anemia 02/06/2017  . Polyneuropathy in diabetes ([place]) 02/24/2013  . Syndrome affecting cervical region 02/24/2013  . Trigeminal neuralgia 02/24/2013  . Type 2 diabetes mellitus with diabetic neuropathy ([place]) 12/14/2014  . Vitamin D deficiency 02/16/2020   Past Surgical History:  Procedure Laterality Date  . ADENOIDECTOMY    . BACK SURGERY  2002 /2003  . CARPAL  TUNNEL RELEASE Bilateral   . I & D EXTREMITY Left 01/31/2020   Procedure: IRRIGATION AND DEBRIDEMENT ANKLE;  Surgeon: Altamese Stonybrook, MD;  Location: [place];  Service: Orthopedics;  Laterality: Left;  . KNEE SURGERY Left 2005  . ORIF ANKLE FRACTURE Left 01/31/2020  . ORIF ANKLE FRACTURE Left 01/31/2020   Procedure: OPEN REDUCTION INTERNAL FIXATION (ORIF) ANKLE FRACTURE;  Surgeon: Altamese Huxley, MD;  Location: [place];  Service: Orthopedics;  Laterality: Left;  . SHOULDER SURGERY Left 1998  . TONSILLECTOMY     Social History   Socioeconomic History  . Marital status: Married    Spouse name: Not on file  . Number of children: Not on file  . Years of education: Not on file  . Highest education level: Not on file  Occupational History  . Not on file  Tobacco Use  . Smoking status: Former Smoker    Packs/day: 3.00    Years: 35.00    Pack years: 105.00    Types: Cigarettes    Quit date: 04/13/2006    Years since quitting: 13.9  . Smokeless tobacco: Never Used  Vaping Use  . Vaping Use: Never used  Substance and Sexual Activity  . Alcohol use: No    Alcohol/week: 0.0 standard drinks  . Drug use: No  . Sexual activity: Not on file  Other Topics Concern  . Not on file  Social History Narrative  . Not on file   Social Determinants of Health   Financial Resource Strain: Not on file  Food Insecurity: Not  on file  Transportation Needs: Not on file  Physical Activity: Not on file  Stress: Not on file  Social Connections: Not on file   Family History  Problem Relation Age of Onset  . Breast cancer Mother   . Heart disease Father   . CAD Father   . Atrial fibrillation Father   . Stomach cancer Paternal Aunt   . Heart disease Paternal Uncle   . Breast cancer Maternal Grandmother    - negative except otherwise stated in the family history section Allergies  Allergen Reactions  . Penicillins Swelling and Other (See Comments)    CEPHALOSPORIN TOLERANT 01/31/2020- "Allergic,"  per paperwork from facility  . Aspirin Other (See Comments)    Relative contraindication due to hematochezia while on it- "Allergic," per paperwork from facility  . Bee Venom Other (See Comments)   Prior to Admission medications   Medication Sig Start Date End Date Taking? Authorizing Provider  acetaminophen (TYLENOL) 325 MG tablet Take 1-2 tablets (325-650 mg total) by mouth every 6 (six) hours as needed for mild pain (pain score 1-3 or temp > 100.5). Patient taking differently: Take 650 mg by mouth every 6 (six) hours as needed for mild pain (or a temp of 100.5 F). 02/15/20  Yes Ainsley Spinner, PA-C  ascorbic acid (VITAMIN C) 1000 MG tablet Take 1 tablet (1,000 mg total) by mouth daily. 02/15/20 04/15/20 Yes Ainsley Spinner, PA-C  Azelastine-Fluticasone 137-50 MCG/ACT SUSP Place 2 sprays into both nostrils 2 (two) times daily.   Yes [provider]  Cholecalciferol (VITAMIN D) 125 MCG (5000 UT) CAPS Take 1 capsule by mouth daily. Patient taking differently: Take 5,000 Units by mouth daily. 02/15/20  Yes Ainsley Spinner, PA-C  dicyclomine (BENTYL) 20 MG tablet Take 20 mg by mouth 2 (two) times daily. 11/27/17  Yes [provider]  docusate sodium (COLACE) 100 MG capsule Take 1 capsule (100 mg total) by mouth 2 (two) times daily. 02/15/20  Yes Ainsley Spinner, PA-C  DULoxetine (CYMBALTA) 60 MG capsule Take 60 mg by mouth daily.  01/29/17  Yes [provider]  ELIQUIS 5 MG TABS tablet TAKE 1 TABLET BY MOUTH TWICE DAILY Patient taking differently: Take 5 mg by mouth 2 (two) times daily. 10/26/19  Yes Richardo Priest, MD  EPINEPHrine 0.3 mg/0.3 mL IJ SOAJ injection Use for life-threatening allergic reactions Patient taking differently: Inject 0.3 mg into the muscle as needed (for life-threatening allergic reactions). 07/23/18  Yes Kozlow, Donnamarie Poag, MD  furosemide (LASIX) 40 MG tablet Take 40 mg by mouth daily as needed for fluid or edema.   Yes [provider]  gabapentin (NEURONTIN)  300 MG capsule Take 600 mg by mouth 3 (three) times daily.    Yes [provider]  metFORMIN (GLUCOPHAGE) 500 MG tablet Take 500 mg by mouth daily with breakfast.   Yes [provider]  metoprolol tartrate (LOPRESSOR) 25 MG tablet Take 1 tablet (25 mg total) by mouth at bedtime. 02/11/20  Yes Hongalgi, Lenis Dickinson, MD  metoprolol tartrate (LOPRESSOR) 50 MG tablet Take 1 tablet (50 mg total) by mouth daily. Patient taking differently: Take 50 mg by mouth in the morning. 02/12/20  Yes Hongalgi, Lenis Dickinson, MD  naloxone [facility]) nasal spray 4 mg/0.1 mL Place 1 spray into the nose See admin instructions. USE 1 SPRAY NASALLY AS NEEDED FOR OPIOID OVERDOSE EMERGENCY   Yes [provider]  naproxen sodium (ALEVE) 220 MG tablet Take 440 mg by mouth daily as needed (for  headaches).   Yes [provider]  nitroGLYCERIN (NITROSTAT) 0.4 MG SL tablet Place 0.4 mg under the tongue every 5 (five) minutes as needed for chest pain. 02/01/14  Yes [provider]  ondansetron (ZOFRAN-ODT) 4 MG disintegrating tablet Take 4 mg by mouth every 8 (eight) hours as needed for nausea or vomiting.   Yes [provider]  oxyCODONE-acetaminophen (PERCOCET) 7.5-325 MG tablet Take 1 tablet by mouth every 8 (eight) hours as needed for severe pain.   Yes [provider]  pantoprazole (PROTONIX) 40 MG tablet Take 40 mg by mouth daily. 08/31/19  Yes [provider]  simvastatin (ZOCOR) 20 MG tablet Take 20 mg by mouth at bedtime.   Yes [provider]  tamsulosin (FLOMAX) 0.4 MG CAPS capsule Take 0.4 mg by mouth at bedtime.   Yes [provider]  traMADol (ULTRAM) 50 MG tablet Take 1 tablet (50 mg total) by mouth every 6 (six) hours as needed for moderate pain or severe pain. 02/15/20  Yes Ainsley Spinner, PA-C  vitamin B-12 1000 MCG tablet Take 1 tablet (1,000 mcg total) by mouth daily. 02/12/20  Yes Hongalgi, Lenis Dickinson, MD   No results found. - pertinent xrays, CT,  MRI studies were reviewed and independently interpreted  Positive ROS: All other systems have been reviewed and were otherwise negative with the exception of those mentioned in the HPI and as above.  Physical Exam: General: Alert, no acute distress Psychiatric: Patient is competent for consent with normal mood and affect Lymphatic: No axillary or cervical lymphadenopathy Cardiovascular: No pedal edema Respiratory: No cyanosis, no use of accessory musculature GI: No organomegaly, abdomen is soft and non-tender    Images:  @ENCIMAGES @  Labs:  Lab Results  Component Value Date   HGBA1C 6.0 (H) 03/08/2020   HGBA1C 6.0 (H) 02/04/2020    Lab Results  Component Value Date   ALBUMIN 2.9 (L) 03/08/2020   ALBUMIN 2.9 (L) 02/16/2020   ALBUMIN 3.2 (L) 02/04/2020    Neurologic: Patient does not have protective sensation bilateral lower extremities.   MUSCULOSKELETAL:   Skin: Examination there is maceration and wound dehiscence over the medial malleolus left ankle.  Patient has strong palpable pulses on the right left foot the pulses are palpable but not as strong secondary to the swelling.  Radiographs shows excellent reduction of the ankle fracture dislocation.  Hemoglobin A1c 6.0 albumin 2.9  Patient has significant venous insufficiency swelling in the left lower extremity.  Assessment: Assessment diabetic insensate neuropathy with wound dehiscence of the medial malleolus left ankle status post open type IIIb contaminated ankle fracture dislocation.  Plan: Plan: Will plan for repeat debridement of the medial malleolus with partial bone excision placement of tissue graft and a wound VAC.  Risks and benefits were discussed including need for additional surgery.  Patient states he understands wished to proceed at this time.  Thank you for the consult and the opportunity to see David Fitzgerald, [place] [phone] 6:47 AM

## 2020-03-10 ENCOUNTER — Encounter (HOSPITAL_COMMUNITY): Payer: Self-pay | Admitting: Orthopedic Surgery

## 2020-03-10 ENCOUNTER — Inpatient Hospital Stay (HOSPITAL_COMMUNITY): Payer: Medicare HMO | Admitting: Certified Registered"

## 2020-03-10 ENCOUNTER — Encounter (HOSPITAL_COMMUNITY)
Admission: AD | Disposition: A | Discharge status: Home / Self Care | Payer: Self-pay | Source: Home / Self Care | Attending: Orthopedic Surgery

## 2020-03-10 DIAGNOSIS — T8133XA Disruption of traumatic injury wound repair, initial encounter: Secondary | ICD-10-CM

## 2020-03-10 HISTORY — PX: I & D EXTREMITY: SHX5045

## 2020-03-10 LAB — GLUCOSE, CAPILLARY
Glucose-Capillary: 105 mg/dL — ABNORMAL HIGH (ref 70–99)
Glucose-Capillary: 114 mg/dL — ABNORMAL HIGH (ref 70–99)
Glucose-Capillary: 93 mg/dL (ref 70–99)
Glucose-Capillary: 97 mg/dL (ref 70–99)
Glucose-Capillary: 110 mg/dL — ABNORMAL HIGH (ref 70–99)
Glucose-Capillary: 174 mg/dL — ABNORMAL HIGH (ref 70–99)

## 2020-03-10 LAB — COMPREHENSIVE METABOLIC PANEL
ALT: 19 U/L (ref 0–44)
AST: 22 U/L (ref 15–41)
Albumin: 2.7 g/dL — ABNORMAL LOW (ref 3.5–5.0)
Alkaline Phosphatase: 72 U/L (ref 38–126)
Anion gap: 10 (ref 5–15)
BUN: 9 mg/dL (ref 6–20)
CO2: 29 mmol/L (ref 22–32)
Calcium: 8.7 mg/dL — ABNORMAL LOW (ref 8.9–10.3)
Chloride: 99 mmol/L (ref 98–111)
Creatinine, Ser: 1.1 mg/dL (ref 0.61–1.24)
GFR, Estimated: >60 mL/min (ref >60)
Glucose, Bld: 115 mg/dL — ABNORMAL HIGH (ref 70–99)
Potassium: 3.8 mmol/L (ref 3.5–5.1)
Sodium: 138 mmol/L (ref 135–145)
Total Bilirubin: 0.6 mg/dL (ref 0.3–1.2)
Total Protein: 7.5 g/dL (ref 6.5–8.1)

## 2020-03-10 SURGERY — IRRIGATION AND DEBRIDEMENT EXTREMITY
Anesthesia: General | Site: Ankle | Laterality: Left

## 2020-03-10 MED ORDER — ONDANSETRON HCL 4 MG/2ML IJ SOLN: INTRAMUSCULAR | Status: DC | PRN

## 2020-03-10 MED ORDER — MIDAZOLAM HCL 2 MG/2ML IJ SOLN
INTRAMUSCULAR | Status: AC
  Filled 2020-03-10: qty 2

## 2020-03-10 MED ORDER — 0.9 % SODIUM CHLORIDE (POUR BTL) OPTIME
TOPICAL | Status: DC | PRN
  Administered 2020-03-10: 1000 mL

## 2020-03-10 MED ORDER — DEXAMETHASONE SODIUM PHOSPHATE 10 MG/ML IJ SOLN
INTRAMUSCULAR | Status: DC | PRN
  Administered 2020-03-10: 4 mg via INTRAVENOUS

## 2020-03-10 MED ORDER — EPHEDRINE SULFATE 50 MG/ML IJ SOLN
INTRAMUSCULAR | Status: DC | PRN
  Administered 2020-03-10: 5 mg via INTRAVENOUS

## 2020-03-10 MED ORDER — OXYCODONE HCL 5 MG/5ML PO SOLN
5.0000 mg | Freq: Once | ORAL | Status: DC | PRN
Start: 2020-03-10 — End: 2020-03-10

## 2020-03-10 MED ORDER — ONDANSETRON HCL 4 MG/2ML IJ SOLN: 4.0000 mg | Freq: Once | INTRAMUSCULAR | Status: DC | PRN

## 2020-03-10 MED ORDER — LACTATED RINGERS IV SOLN: INTRAVENOUS | Status: DC

## 2020-03-10 MED ORDER — SODIUM CHLORIDE 0.9 % IV SOLN: INTRAVENOUS | Status: DC

## 2020-03-10 MED ORDER — FENTANYL CITRATE (PF) 100 MCG/2ML IJ SOLN
INTRAMUSCULAR | Status: DC | PRN
  Administered 2020-03-10 (×3): 50 ug via INTRAVENOUS

## 2020-03-10 MED ORDER — PROPOFOL 10 MG/ML IV BOLUS
INTRAVENOUS | Status: DC | PRN
  Administered 2020-03-10: 200 mg via INTRAVENOUS

## 2020-03-10 MED ORDER — ONDANSETRON HCL 4 MG/2ML IJ SOLN
INTRAMUSCULAR | Status: DC | PRN
  Administered 2020-03-10: 4 mg via INTRAVENOUS

## 2020-03-10 MED ORDER — CHLORHEXIDINE GLUCONATE 0.12 % MT SOLN
OROMUCOSAL | Status: AC
  Administered 2020-03-10: 15 mL via OROMUCOSAL
  Filled 2020-03-10: qty 15

## 2020-03-10 MED ORDER — AMISULPRIDE (ANTIEMETIC) 5 MG/2ML IV SOLN: 10.0000 mg | Freq: Once | INTRAVENOUS | Status: DC | PRN

## 2020-03-10 MED ORDER — FENTANYL CITRATE (PF) 250 MCG/5ML IJ SOLN
INTRAMUSCULAR | Status: AC
  Filled 2020-03-10: qty 5

## 2020-03-10 MED ORDER — FENTANYL CITRATE (PF) 100 MCG/2ML IJ SOLN: 25.0000 ug | INTRAMUSCULAR | Status: DC | PRN

## 2020-03-10 MED ORDER — LIDOCAINE 2% (20 MG/ML) 5 ML SYRINGE
INTRAMUSCULAR | Status: DC | PRN
  Administered 2020-03-10: 60 mg via INTRAVENOUS

## 2020-03-10 MED ORDER — PROPOFOL 10 MG/ML IV BOLUS
INTRAVENOUS | Status: AC
  Filled 2020-03-10: qty 20

## 2020-03-10 MED ORDER — CHLORHEXIDINE GLUCONATE 0.12 % MT SOLN
15.0000 mL | OROMUCOSAL | Status: AC
  Filled 2020-03-10: qty 15

## 2020-03-10 MED ORDER — PHENYLEPHRINE HCL (PRESSORS) 10 MG/ML IV SOLN
INTRAVENOUS | Status: DC | PRN
  Administered 2020-03-10: 80 ug via INTRAVENOUS
  Administered 2020-03-10: 120 ug via INTRAVENOUS
  Administered 2020-03-10: 40 ug via INTRAVENOUS
  Administered 2020-03-10: 120 ug via INTRAVENOUS
  Administered 2020-03-10 (×2): 80 ug via INTRAVENOUS

## 2020-03-10 MED ORDER — OXYCODONE HCL 5 MG PO TABS: 5.0000 mg | ORAL_TABLET | Freq: Once | ORAL | Status: DC | PRN

## 2020-03-10 SURGICAL SUPPLY — 30 items
BLADE SURG 21 STRL SS (BLADE) ×2 IMPLANT
BNDG COHESIVE 4X5 TAN STRL (GAUZE/BANDAGES/DRESSINGS) ×1 IMPLANT
BNDG COHESIVE 6X5 TAN STRL LF (GAUZE/BANDAGES/DRESSINGS) ×1 IMPLANT
CANISTER WOUND CARE 500ML ATS (WOUND CARE) ×1 IMPLANT
COVER SURGICAL LIGHT HANDLE (MISCELLANEOUS) ×3 IMPLANT
COVER WAND RF STERILE (DRAPES) IMPLANT
DRAPE DERMATAC (DRAPES) ×2 IMPLANT
DRAPE U-SHAPE 47X51 STRL (DRAPES) ×2 IMPLANT
DRESSING VERAFLO CLEANSE CC (GAUZE/BANDAGES/DRESSINGS) IMPLANT
DRSG VERAFLO CLEANSE CC (GAUZE/BANDAGES/DRESSINGS) ×2
DURAPREP 26ML APPLICATOR (WOUND CARE) ×2 IMPLANT
ELECT REM PT RETURN 9FT ADLT (ELECTROSURGICAL)
ELECTRODE REM PT RTRN 9FT ADLT (ELECTROSURGICAL) IMPLANT
GLOVE BIOGEL PI IND STRL 9 (GLOVE) ×1 IMPLANT
GLOVE BIOGEL PI INDICATOR 9 (GLOVE) ×1
GLOVE SURG ORTHO 9.0 STRL STRW (GLOVE) ×2 IMPLANT
GOWN STRL REUS W/ TWL XL LVL3 (GOWN DISPOSABLE) ×2 IMPLANT
GOWN STRL REUS W/TWL XL LVL3 (GOWN DISPOSABLE) ×4
GRAFT SKIN WND OMEGA3 7X10 (Tissue) ×2 IMPLANT
GRAFT SKN 7X10XSTRL LF DISP (Tissue) IMPLANT
KIT BASIN OR (CUSTOM PROCEDURE TRAY) ×2 IMPLANT
KIT TURNOVER KIT B (KITS) ×2 IMPLANT
MANIFOLD NEPTUNE II (INSTRUMENTS) ×2 IMPLANT
NS IRRIG 1000ML POUR BTL (IV SOLUTION) ×2 IMPLANT
PACK ORTHO EXTREMITY (CUSTOM PROCEDURE TRAY) ×2 IMPLANT
PAD ARMBOARD 7.5X6 YLW CONV (MISCELLANEOUS) ×4 IMPLANT
SUT ETHILON 2 0 PSLX (SUTURE) ×2 IMPLANT
TOWEL GREEN STERILE (TOWEL DISPOSABLE) ×2 IMPLANT
TUBE CONNECTING 12X1/4 (SUCTIONS) ×2 IMPLANT
YANKAUER SUCT BULB TIP NO VENT (SUCTIONS) ×2 IMPLANT

## 2020-03-10 NOTE — Interval H&P Note (Signed)
History and Physical Interval Note:  03/10/2020 7:12 AM  David Fitzgerald  has presented today for surgery, with the diagnosis of Osteomyelitis Left Ankle.  The various methods of treatment have been discussed with the patient and family. After consideration of risks, benefits and other options for treatment, the patient has consented to  Procedure(s): DEBRIDEMENT LEFT ANKLE, APPLY SKIN GRAFT (Left) as a surgical intervention.  The patient's history has been reviewed, patient examined, no change in status, stable for surgery.  I have reviewed the patient's chart and labs.  Questions were answered to the patient's satisfaction.     [name]

## 2020-03-10 NOTE — Progress Notes (Signed)
1505 Pt to short stay. NPIO post mn maint. Report was given.

## 2020-03-10 NOTE — Anesthesia Procedure Notes (Signed)
Procedure Name: LMA Insertion Date/Time: 03/10/2020 3:34 PM Performed by: Amadeo Garnet, CRNA Pre-anesthesia Checklist: Patient identified, Suction available, Emergency Drugs available and Patient being monitored Patient Re-evaluated:Patient Re-evaluated prior to induction Oxygen Delivery Method: Circle system utilized Preoxygenation: Pre-oxygenation with 100% oxygen Induction Type: IV induction LMA: LMA inserted LMA Size: 5.0 Placement Confirmation: positive ETCO2 Dental Injury: Teeth and Oropharynx as per pre-operative assessment

## 2020-03-10 NOTE — Anesthesia Preprocedure Evaluation (Signed)
Anesthesia Evaluation  Patient identified by MRN, date of birth, ID band Patient awake    Reviewed: Allergy & Precautions, NPO status , Patient's Chart, lab work & pertinent test results, reviewed documented beta blocker date and time   History of Anesthesia Complications Negative for: history of anesthetic complications  Airway Mallampati: II  TM Distance: >3 FB Neck ROM: Full    Dental  (+) Edentulous Upper, Edentulous Lower   Pulmonary sleep apnea and Continuous Positive Airway Pressure Ventilation , former smoker,    Pulmonary exam normal        Cardiovascular hypertension, Pt. on medications and Pt. on home beta blockers + CAD  Normal cardiovascular exam+ dysrhythmias Atrial Fibrillation      Neuro/Psych Anxiety negative neurological ROS     GI/Hepatic negative GI ROS, Neg liver ROS,   Endo/Other  diabetes, Type 2Morbid obesity  Renal/GU negative Renal ROS  negative genitourinary   Musculoskeletal  (+) Arthritis ,   Abdominal   Peds  Hematology  (+) anemia ,   Anesthesia Other Findings   Reproductive/Obstetrics                           Anesthesia Physical Anesthesia Plan  ASA: III  Anesthesia Plan: General   Post-op Pain Management:    Induction: Intravenous  PONV Risk Score and Plan: 2 and Ondansetron, Dexamethasone, Midazolam and Treatment may vary due to age or medical condition  Airway Management Planned: LMA  Additional Equipment: None  Intra-op Plan:   Post-operative Plan: Extubation in OR  Informed Consent: I have reviewed the patients History and Physical, chart, labs and discussed the procedure including the risks, benefits and alternatives for the proposed anesthesia with the patient or authorized representative who has indicated his/her understanding and acceptance.     Dental advisory given  Plan Discussed with:   Anesthesia Plan Comments:          Anesthesia Quick Evaluation

## 2020-03-10 NOTE — Plan of Care (Signed)

## 2020-03-10 NOTE — Progress Notes (Signed)
                                 Orthopaedic Trauma Service Progress Note  Patient ID: David Fitzgerald MRN: [id number] DOB/AGE: 02-17-1960 60 y.o.  OR today with Dr. Sharol Given   Appreciate his care   Pt doing well No acute issues No complaints   Further management per Dr. Gavin Potters Service    Jari Pigg, PA-C [phone] (C) 03/10/2020, 10:27 AM  [facility] [place] [address] [phone] Jenetta [phone] (F)    After 5pm and on the weekends please log on to Amion, go to orthopaedics and the look under the [facility] Call for the provider(s) on call. You can also call our office at [phone] and then follow the prompts to be connected to the call team.

## 2020-03-10 NOTE — Op Note (Signed)
03/10/2020  4:24 PM  PATIENT:  David Fitzgerald    PRE-OPERATIVE DIAGNOSIS:  Osteomyelitis Left Ankle  POST-OPERATIVE DIAGNOSIS:  Same  PROCEDURE:  DEBRIDEMENT LEFT ANKLE, APPLY SKIN GRAFT 70 cm Local tissue rearrangement for wound closure 11 x 4 cm  SURGEON:  [name], MD  PHYSICIAN ASSISTANT:None ANESTHESIA:   General  PREOPERATIVE INDICATIONS:  David Fitzgerald is a  60 y.o. male with a diagnosis of Osteomyelitis Left Ankle who failed conservative measures and elected for surgical management.    The risks benefits and alternatives were discussed with the patient preoperatively including but not limited to the risks of infection, bleeding, nerve injury, cardiopulmonary complications, the need for revision surgery, among others, and the patient was willing to proceed.  OPERATIVE IMPLANTS: Kerecis skin graft 7 x 10 cm, Prevena wound VAC with cleanse choice dressing  @ENCIMAGES @  OPERATIVE FINDINGS: Patient had infection that went down to the fascia over the bone no clinical evidence of bone infection.  The soft tissue was sent for cultures.  OPERATIVE PROCEDURE: Patient was brought the operating room and underwent a general anesthetic.  After adequate levels anesthesia were obtained patient's left lower extremity was first scrubbed with ChloraPrep sponge dried and then prepped using DuraPrep draped into a sterile field a timeout was called.  An elliptical incision was made around the wound dehiscence over the medial malleolus this left a wound that was 11 x 4 cm.  Nonviable soft tissue was excised with a 21 blade knife and a rondure.  This included nonviable tissue.  The wound was irrigated with normal saline electrocautery was used hemostasis.  Double stacked Kerecis skin graft was applied double stacked local tissue rearrangement was used to close the wound 11 x 4 cm over the keratosis graft this was then covered with the reticulated cleanse choice foam followed by the solid foam sealed  with dermatac and covered with Covan this had a good suction fit patient was extubated taken the PACU in stable condition  Debridement type: Excisional Debridement  Side: left  Body Location: left medial mallelous   Tools used for debridement: scalpel and rongeur  Pre-debridement Wound size (cm):   Length: 5        Width: 1     Depth: .5   Post-debridement Wound size (cm):   Length: 11        Width: 4     Depth: 1   Debridement depth beyond dead/damaged tissue down to healthy viable tissue: yes  Tissue layer involved: skin, skin, subcutaneous tissue and skin, subcutaneous tissue, muscle / fascia  Nature of tissue removed: Slough, Necrotic, Devitalized Tissue, Non-viable tissue and Purulence  Irrigation volume: 1 liter     Irrigation fluid type: Normal Saline       DISCHARGE PLANNING:  Antibiotic duration: Continue IV antibiotics and adjust according to the tissue cultures.  Weightbearing: Nonweightbearing on the left with a cam walker  Pain medication: Opioid pathway  Dressing care/ Wound VAC: Continue wound VAC for 1 week  Ambulatory devices: Walker or crutches  Discharge to: Discharge plan pending recommendations from physical therapy patient may require skilled nursing.  Follow-up: In the office 1 week post operative.

## 2020-03-10 NOTE — Transfer of Care (Signed)
Immediate Anesthesia Transfer of Care Note  Patient: David Fitzgerald  Procedure(s) Performed: DEBRIDEMENT LEFT ANKLE, APPLY SKIN GRAFT (Left Ankle)  Patient Location: PACU  Anesthesia Type:General  Level of Consciousness: awake, alert  and oriented  Airway & Oxygen Therapy: Patient Spontanous Breathing and Patient connected to face mask  Post-op Assessment: Report given to RN and Post -op Vital signs reviewed and stable  Post vital signs: Reviewed and stable  Last Vitals:  Vitals Value Taken Time  BP 134/78 03/10/20 1616  Temp    Pulse 92 03/10/20 1618  Resp 17 03/10/20 1618  SpO2 97 % 03/10/20 1618  Vitals shown include unvalidated device data.  Last Pain:  Vitals:   03/10/20 1228  TempSrc: Oral  PainSc: 0-No pain         Complications: No complications documented.

## 2020-03-10 NOTE — Anesthesia Postprocedure Evaluation (Signed)
Anesthesia Post Note  Patient: David Fitzgerald  Procedure(s) Performed: DEBRIDEMENT LEFT ANKLE, APPLY SKIN GRAFT (Left Ankle)     Patient location during evaluation: PACU Anesthesia Type: General Level of consciousness: awake and alert Pain management: pain level controlled Vital Signs Assessment: post-procedure vital signs reviewed and stable Respiratory status: spontaneous breathing, nonlabored ventilation and respiratory function stable Cardiovascular status: blood pressure returned to baseline and stable Postop Assessment: no apparent nausea or vomiting Anesthetic complications: no   No complications documented.  Last Vitals:  Vitals:   03/10/20 1615 03/10/20 1630  BP: 134/78 128/73  Pulse: 100 93  Resp: 11 13  Temp: 36.7 C   SpO2: 96% 94%    Last Pain:  Vitals:   03/10/20 1615  TempSrc:   PainSc: 0-No pain                 Lidia Collum

## 2020-03-11 LAB — GLUCOSE, CAPILLARY
Glucose-Capillary: 124 mg/dL — ABNORMAL HIGH (ref 70–99)
Glucose-Capillary: 102 mg/dL — ABNORMAL HIGH (ref 70–99)
Glucose-Capillary: 109 mg/dL — ABNORMAL HIGH (ref 70–99)
Glucose-Capillary: 138 mg/dL — ABNORMAL HIGH (ref 70–99)

## 2020-03-11 NOTE — Plan of Care (Signed)

## 2020-03-11 NOTE — Plan of Care (Signed)
  Problem: Health Behavior/Discharge Planning: Goal: Ability to manage health-related needs will improve Outcome: Progressing   Problem: Activity: Goal: Risk for activity intolerance will decrease Outcome: Progressing   Problem: Pain Managment: Goal: General experience of comfort will improve Outcome: Progressing   

## 2020-03-11 NOTE — Progress Notes (Signed)
Orthopedic Tech Progress Note Patient Details:  David Fitzgerald 09-30-1960 [id number] Left at bedside Ortho Devices Type of Ortho Device: CAM walker Ortho Device/Splint Location: LLe Ortho Device/Splint Interventions: Ordered       [name] A Cyriah Childrey 03/11/2020, 2:04 PM

## 2020-03-11 NOTE — Progress Notes (Signed)
Patient ID: David Fitzgerald, male   DOB: 05-Aug-1960, 60 y.o.   MRN: [id number] Patient is postoperative day 1 excision of nonviable tissue infection from an open medial malleolar dislocation.  The wound was debrided, Kerecis skin graft was applied and the wound VAC.  The wound VAC has a good suction fit no drainage.  Awaiting final cultures.  Patient will need the wound VAC for at least a week discussed the importance of strict nonweightbearing patient does have a kneeling scooter but I discussed that he needs to be using something else like a walker or crutches if he is not on a kneeling scooter.  I will follow-up Monday morning.

## 2020-03-11 NOTE — Plan of Care (Signed)

## 2020-03-11 NOTE — Evaluation (Signed)
Occupational Therapy Evaluation Patient Details Name: David Fitzgerald MRN: [id number] DOB: November 29, 1960 Today's Date: 03/11/2020    History of Present Illness David Fitzgerald is a 60 y.o. male with medical history significant of paroxysmal A. fib on Eliquis, IIDM, diabetic neuropathy, morbid obesity, BPH, venous insufficiency on as needed Lasix, presented with mechanical fall and left ankle fracture. Now s/p ORIF, NWB.He went to rehab for 3 weeks and now retuerns for another debridement. .   Clinical Impression   Patient admitted for the procedure above.  He has been completing OT/PT over the last 3 weeks at a SNF.  He has a good understanding of his NWB status, and the DME he will need once he transitions home.  His mother and a grandchild will be staying with him initially when he gets home.  OT will follow him in the acute setting to ensure max functional status and a safe transition home.      Follow Up Recommendations  Home health OT    Equipment Recommendations  Tub/shower bench    Recommendations for Other Services       Precautions / Restrictions Precautions Precautions: Fall Restrictions Weight Bearing Restrictions: Yes LLE Weight Bearing: Non weight bearing Other Position/Activity Restrictions: CAM walker delivered to the room      Mobility Bed Mobility Overal bed mobility: Modified Independent                  Transfers Overall transfer level: Needs assistance Equipment used: Rolling walker (2 wheeled) Transfers: Sit to/from [employer] Transfers Sit to Stand: Supervision Stand pivot transfers: Supervision;From elevated surface       General transfer comment: patient cued not to pull from RW, and to push from the bed.    Balance   Sitting-balance support: No upper extremity supported;Feet supported Sitting balance-[name]: Good     Standing balance support: Bilateral upper extremity supported;During functional activity Standing  balance-[name]: Poor Standing balance comment: reliant on external support due to NWB                           ADL either performed or assessed with clinical judgement   ADL Overall ADL's : Needs assistance/impaired Eating/Feeding: Independent;Sitting   Grooming: Wash/dry face;Wash/dry hands;Min guard;Standing   Upper Body Bathing: Set up;Sitting   Lower Body Bathing: Set up;Sitting/lateral leans;Supervison/ safety   Upper Body Dressing : Set up;Sitting   Lower Body Dressing: Set up;Sitting/lateral leans   Toilet Transfer: RW;Min guard;Ambulation;Comfort height toilet;Regular Toilet;Grab bars Toilet Transfer Details (indicate cue type and reason): alos has a knee scooter in the room... was using this for mobility in the SNF Toileting- Clothing Manipulation and Hygiene: Sit to/from stand;Min guard       Functional mobility during ADLs: Min guard;Rolling walker       Vision Patient Visual Report: No change from baseline       Perception     Praxis      Pertinent Vitals/Pain Pain Assessment: No/denies pain Pain Intervention(s): Monitored during session     Hand Dominance Right   Extremity/Trunk Assessment Upper Extremity Assessment Upper Extremity Assessment: Overall WFL for tasks assessed   Lower Extremity Assessment Lower Extremity Assessment: Defer to PT evaluation   Cervical / Trunk Assessment Cervical / Trunk Assessment: Normal   Communication Communication Communication: No difficulties   Cognition Arousal/Alertness: Awake/alert Behavior During Therapy: WFL for tasks assessed/performed Overall Cognitive Status: Within Functional Limits for tasks assessed  General Comments   CAM Walker delivered    Exercises     Shoulder Instructions      Home Living Family/patient expects to be discharged to:: Private residence Living Arrangements: Parent;Other relatives (mother and  granddaughter will be staying with him for a few weeks.  Normally he lives alone.) Available Help at Discharge: Family;Available 24 hours/day Type of Home: House Home Access: Ramped entrance     Home Layout: One level     Bathroom Shower/Tub: Tub/shower unit;Curtain   Biochemist, clinical: Standard Bathroom Accessibility: Yes How Accessible: Accessible via walker Home Equipment: [place] - 2 wheels;Walker - 4 wheels;Hand held shower head;Other (comment);Bedside commode;Wheelchair - manual   Additional Comments: wheelchair and BSC from [employer].      Prior Functioning/Environment Level of Independence: Independent        Comments: Recently he spent 3 weeks at a SNF participating with OT and PT        OT Problem List: Decreased strength;Decreased activity tolerance;Impaired balance (sitting and/or standing);Decreased knowledge of use of DME or AE      OT Treatment/Interventions: Self-care/ADL training;Therapeutic exercise;Energy conservation;DME and/or AE instruction;Therapeutic activities;Patient/family education;Balance training    OT Goals(Current goals can be found in the care plan section) Acute Rehab OT Goals Patient Stated Goal: To get home when MD says hes cleared OT Goal Formulation: With patient Time For Goal Achievement: 03/25/20 Potential to Achieve Goals: Good ADL Goals Pt Will Perform Lower Body Bathing: with modified independence;sit to/from stand Pt Will Perform Lower Body Dressing: with modified independence Pt Will Transfer to Toilet: with modified independence;ambulating;regular height toilet Pt Will Perform Toileting - Clothing Manipulation and hygiene: with modified independence;sit to/from stand  OT Frequency: Min 2X/week   Barriers to D/C:    none noted       Co-evaluation              AM-PAC OT "6 Clicks" Daily Activity     Outcome Measure Help from another person eating meals?: None Help from another person taking care of personal  grooming?: A Little Help from another person toileting, which includes using toliet, bedpan, or urinal?: A Little Help from another person bathing (including washing, rinsing, drying)?: A Little Help from another person to put on and taking off regular upper body clothing?: None Help from another person to put on and taking off regular lower body clothing?: A Little 6 Click Score: 20   End of Session Equipment Utilized During Treatment: Gait belt;Rolling walker  Activity Tolerance: Patient tolerated treatment well Patient left: in bed;with call bell/phone within reach  OT Visit Diagnosis: Unsteadiness on feet (R26.81);Other abnormalities of gait and mobility (R26.89);Muscle weakness (generalized) (M62.81)                Time: 9211-9417 OT Time Calculation (min): 17 min Charges:  OT General Charges $OT Visit: 1 Visit OT Evaluation $OT Eval Moderate Complexity: 1 Mod  03/11/2020  Rich, OTR/L  Acute Rehabilitation Services  Office:  [place] 03/11/2020, 4:11 PM

## 2020-03-11 NOTE — Evaluation (Signed)
Physical Therapy Evaluation Patient Details Name: David Fitzgerald MRN: [id number] DOB: [birth year] Today's Date: 03/11/2020   History of Present Illness  David Fitzgerald is a 60 y.o. male with medical history significant of paroxysmal A. fib on Eliquis, IIDM, diabetic neuropathy, morbid obesity, BPH, venous insufficiency on as needed Lasix, presented with mechanical fall and left ankle fracture. Now s/p ORIF, NWB.He went to rehab for 3 weeks and now retuerns for another debridement. .  Clinical Impression  Patient presents with decreased mobility but was able to maintain non-weight bearing well. He became fatigued with gait. He has a knee scooter which he has been using at rehab. We will work with him with that. He feels like his mobility has not changed much following the debridement and he feels comfortable going home. He has a ramp into his house. His mother and grandson will be home to help him. Acute therapy will continue to work with him. He would also benefit from home health.     Follow Up Recommendations Home health PT;Supervision/Assistance - 24 hour    Equipment Recommendations  3in1 (PT)    Recommendations for Other Services       Precautions / Restrictions Precautions Precautions: Fall Restrictions Weight Bearing Restrictions: Yes LLE Weight Bearing: Non weight bearing      Mobility  Bed Mobility Overal bed mobility: Modified Independent Bed Mobility: Supine to Sit   Sidelying to sit: Modified independent (Device/Increase time) Supine to sit: Modified independent (Device/Increase time)     General bed mobility comments: able to sit up at the edge of the bed and lie back    Transfers Overall transfer level: Needs assistance Equipment used: Rolling walker (2 wheeled) Transfers: Sit to/from Stand Sit to Stand: Supervision         General transfer comment: supervision for intial standing balance  Ambulation/Gait Ambulation/Gait assistance: Supervision Gait  Distance (Feet): 15 Feet Assistive device: Rolling walker (2 wheeled) Gait Pattern/deviations: Step-to pattern Gait velocity: decreased Gait velocity interpretation: <1.31 ft/sec, indicative of household ambulator General Gait Details: became fatigued with gait. Has been using kenee scooter at rehab. Knee scooter tested 2nd to fatigue.  Stairs            Wheelchair Mobility    Modified Rankin (Stroke Patients Only)       Balance Overall balance assessment: Needs assistance Sitting-balance support: No upper extremity supported;Feet supported Sitting balance-[name]: Good     Standing balance support: Bilateral upper extremity supported;During functional activity Standing balance-[name]: Poor Standing balance comment: reliant on external support due to NWB                             Pertinent Vitals/Pain Pain Assessment: No/denies pain Faces Pain Scale: No hurt    Home Living Family/patient expects to be discharged to:: Private residence Living Arrangements: Alone Available Help at Discharge: Family;Available PRN/intermittently Type of Home: House Home Access: Ramped entrance     Home Layout: One level   Additional Comments: feels like her needs a tub bench    Prior Function Level of Independence: Independent         Comments: No use of AD, independent. Was working on plumbing outside of home when fall occurred     Hand Dominance   Dominant Hand: Right    Extremity/Trunk Assessment   Upper Extremity Assessment Upper Extremity Assessment: Overall WFL for tasks assessed    Lower Extremity Assessment Lower Extremity Assessment:  Overall WFL for tasks assessed LLE Deficits / Details: Hip and knee ROM and strength grossly WFL; lower leg, ankle and foot immobilized in splint; able to actively wiggle toes; decr sensation toes, but that is chronic LLE Sensation: decreased light touch;history of peripheral neuropathy    Cervical /  Trunk Assessment Cervical / Trunk Assessment: Other exceptions Cervical / Trunk Exceptions: large body habitus  Communication   Communication: No difficulties  Cognition Arousal/Alertness: Awake/alert Behavior During Therapy: WFL for tasks assessed/performed Overall Cognitive Status: Within Functional Limits for tasks assessed                                 General Comments: eager to get moving      General Comments General comments (skin integrity, edema, etc.): wound vac on the right foot    Exercises     Assessment/Plan    PT Assessment Patient needs continued PT services  PT Problem List Decreased strength;Decreased range of motion;Decreased activity tolerance;Decreased balance;Decreased mobility;Decreased coordination;Decreased knowledge of use of DME;Decreased knowledge of precautions;Impaired sensation;Obesity;Decreased skin integrity       PT Treatment Interventions DME instruction;Gait training;Functional mobility training;Therapeutic activities;Therapeutic exercise;Balance training;Patient/family education;Wheelchair mobility training    PT Goals (Current goals can be found in the Care Plan section)  Acute Rehab PT Goals Patient Stated Goal: to go home PT Goal Formulation: With patient Time For Goal Achievement: 02/22/20 Potential to Achieve Goals: Good    Frequency Min 3X/week   Barriers to discharge        Co-evaluation               AM-PAC PT "6 Clicks" Mobility  Outcome Measure Help needed turning from your back to your side while in a flat bed without using bedrails?: None Help needed moving from lying on your back to sitting on the side of a flat bed without using bedrails?: None Help needed moving to and from a bed to a chair (including a wheelchair)?: A Little Help needed standing up from a chair using your arms (e.g., wheelchair or bedside chair)?: A Little Help needed to walk in hospital room?: A Little Help needed climbing  3-5 steps with a railing? : A Lot 6 Click Score: 19    End of Session Equipment Utilized During Treatment: Gait belt Activity Tolerance: Patient tolerated treatment well Patient left: in bed;with call bell/phone within reach;with family/visitor present Nurse Communication: Mobility status PT Visit Diagnosis: Unsteadiness on feet (R26.81);Other abnormalities of gait and mobility (R26.89)    Time: 4098-1191 PT Time Calculation (min) (ACUTE ONLY): 27 min   Charges:   PT Evaluation $PT Eval Moderate Complexity: 1 Mod            Carney Living PT DPT  03/11/2020, 10:51 AM

## 2020-03-12 LAB — GLUCOSE, CAPILLARY
Glucose-Capillary: 125 mg/dL — ABNORMAL HIGH (ref 70–99)
Glucose-Capillary: 121 mg/dL — ABNORMAL HIGH (ref 70–99)
Glucose-Capillary: 103 mg/dL — ABNORMAL HIGH (ref 70–99)
Glucose-Capillary: 136 mg/dL — ABNORMAL HIGH (ref 70–99)

## 2020-03-12 NOTE — Plan of Care (Signed)
  Problem: Education: Goal: Knowledge of General Education information will improve Description Including pain rating scale, medication(s)/side effects and non-pharmacologic comfort measures Outcome: Progressing   Problem: Health Behavior/Discharge Planning: Goal: Ability to manage health-related needs will improve Outcome: Progressing   

## 2020-03-12 NOTE — Plan of Care (Signed)

## 2020-03-13 ENCOUNTER — Encounter (HOSPITAL_COMMUNITY): Payer: Self-pay | Admitting: Orthopedic Surgery

## 2020-03-13 LAB — GLUCOSE, CAPILLARY
Glucose-Capillary: 115 mg/dL — ABNORMAL HIGH (ref 70–99)
Glucose-Capillary: 130 mg/dL — ABNORMAL HIGH (ref 70–99)
Glucose-Capillary: 119 mg/dL — ABNORMAL HIGH (ref 70–99)

## 2020-03-13 NOTE — Progress Notes (Signed)
Is postop day 3 status post excisional debridement from open medial malleoli dislocation skin graft and VAC.  The VAC is working good with no drainage.  Awaiting final cultures.  Patient is comfortable sleeping but easily awakened.  Discussed with patient that he will be here for another day until we can finalize cultures for oral antibiotics to discharge on.  He is fine with this plan.  Plan for discharge tomorrow

## 2020-03-13 NOTE — Progress Notes (Signed)
Occupational Therapy Treatment Patient Details Name: David Fitzgerald MRN: [id number] DOB: April 23, 1960 Today's Date: 03/13/2020    History of present illness David Fitzgerald is a 60 y.o. male with medical history significant of paroxysmal A. fib on Eliquis, IIDM, diabetic neuropathy, morbid obesity, BPH, venous insufficiency on as needed Lasix, presented with mechanical fall and left ankle fracture. Now s/p ORIF, NWB.He went to rehab for 3 weeks and now retuerns for another debridement. .   OT comments  Pt making progress with functional goals. OT will continue to follow acutely to maximize level of function and safety  Follow Up Recommendations  Home health OT    Equipment Recommendations  Tub/shower bench    Recommendations for Other Services      Precautions / Restrictions Precautions Precautions: Fall Restrictions Weight Bearing Restrictions: Yes LLE Weight Bearing: Non weight bearing Other Position/Activity Restrictions: has CAM walker       Mobility Bed Mobility Overal bed mobility: Modified Independent Bed Mobility: Supine to Sit   Sidelying to sit: Modified independent (Device/Increase time) Supine to sit: Modified independent (Device/Increase time)     General bed mobility comments: able to sit up at the edge of the bed and lie back  Transfers Overall transfer level: Needs assistance Equipment used: Rolling walker (2 wheeled) Transfers: Sit to/from [employer] Sit to Stand: Supervision Stand pivot transfers: Supervision       General transfer comment: transferred to toilet from RW level and back to sitting EOB    Balance Overall balance assessment: Needs assistance Sitting-balance support: No upper extremity supported;Feet supported Sitting balance-[name]: Good     Standing balance support: Bilateral upper extremity supported;During functional activity Standing balance-[name]: Poor                             ADL  either performed or assessed with clinical judgement   ADL Overall ADL's : Needs assistance/impaired     Grooming: Wash/dry face;Wash/dry hands;Min guard;Standing       Lower Body Bathing: Set up;Sitting/lateral leans;Supervison/ safety Lower Body Bathing Details (indicate cue type and reason): simulated     Lower Body Dressing: Set up;Sitting/lateral leans;Supervision/safety   Toilet Transfer: RW;Ambulation;Comfort height toilet;Regular Toilet;Grab bars;Supervision/safety   Toileting- Clothing Manipulation and Hygiene: Sit to/from stand;Supervision/safety       Functional mobility during ADLs: Supervision/safety;Rolling walker       Vision Patient Visual Report: No change from baseline     Perception     Praxis      Cognition Arousal/Alertness: Awake/alert Behavior During Therapy: WFL for tasks assessed/performed Overall Cognitive Status: Within Functional Limits for tasks assessed                                 General Comments: eager to get moving        Exercises     Shoulder Instructions       General Comments      Pertinent Vitals/ Pain       Pain Assessment: No/denies pain Faces Pain Scale: No hurt Pain Intervention(s): Monitored during session  Home Living                                          Prior Functioning/Environment  Frequency  Min 2X/week        Progress Toward Goals  OT Goals(current goals can now be found in the care plan section)  Progress towards OT goals: Progressing toward goals  Acute Rehab OT Goals Patient Stated Goal: To get home OT Goal Formulation: With patient  Plan Discharge plan remains appropriate    Co-evaluation                 AM-PAC OT "6 Clicks" Daily Activity     Outcome Measure   Help from another person eating meals?: None Help from another person taking care of personal grooming?: A Little Help from another person toileting, which  includes using toliet, bedpan, or urinal?: A Little Help from another person bathing (including washing, rinsing, drying)?: A Little Help from another person to put on and taking off regular upper body clothing?: None Help from another person to put on and taking off regular lower body clothing?: A Little 6 Click Score: 20    End of Session Equipment Utilized During Treatment: Gait belt;Rolling walker  OT Visit Diagnosis: Unsteadiness on feet (R26.81);Other abnormalities of gait and mobility (R26.89);Muscle weakness (generalized) (M62.81)   Activity Tolerance Patient tolerated treatment well   Patient Left in bed;with call bell/phone within reach;Other (comment) (sitting EOB with PT present)   Nurse Communication          Time: 0626-9485 OT Time Calculation (min): 31 min  Charges: OT General Charges $OT Visit: 1 Visit OT Treatments $Self Care/Home Management : 8-22 mins $Therapeutic Activity: 8-22 mins     [name] 03/13/2020, 2:54 PM

## 2020-03-13 NOTE — Consult Note (Signed)
   [facility] Inpatient Consult   03/13/2020  TRAYTON SZABO December 20, 1960 [id number]  [facility] [employer] [ACO] Patient: [facility] Medicare  Patient was previously referred to  [facility] Management team for discharge transition follow up.  Spoke with the patient on the hospital bedside phone to confirm HIPAA and contact information.  Patient agrees that either phone can have messages left for follow up.  Patient denies issues with transportation or food insecurity issues.  Encouraged him to let the nurse know if this changes.  He agreed.  Plan:  Request team to follow up from previous request. Plan to assign for post hospital follow up if retuning to community.  Of note, [facility] Care Management services does not replace or interfere with any services that are arranged by inpatient [facility] care management team.   For additional questions or referrals please contact:  Natividad Brood, RN BSN [facility] Liaison  [phone] business mobile phone Toll free office [phone]  Fax number: [phone] [email or web address]

## 2020-03-13 NOTE — Progress Notes (Signed)
Physical Therapy Treatment Patient Details Name: David Fitzgerald MRN: [id number] DOB: 11-15-1960 Today's Date: 03/13/2020    History of Present Illness David Fitzgerald is a 60 y.o. male with medical history significant of paroxysmal A. fib on Eliquis, IIDM, diabetic neuropathy, morbid obesity, BPH, venous insufficiency on as needed Lasix, presented with mechanical fall and left ankle fracture. Now s/p ORIF, NWB.He went to rehab for 3 weeks and now retuerns for another debridement. .    PT Comments    Continuing work on functional mobility and activity tolerance;  Session focused on using the knee scooter for mobility/as an ambulation aide -- pt had been using the knee scooter at SNF for about three weeks, so he was well-versed in its use; used the knee scooter well, able to mobilize into the hallways; On track for dc home tomorrow  Follow Up Recommendations  Home health PT;Supervision/Assistance - 24 hour     Equipment Recommendations  3in1 (PT)    Recommendations for Other Services       Precautions / Restrictions Precautions Precautions: Fall Restrictions LLE Weight Bearing: Non weight bearing Other Position/Activity Restrictions: CAM walker delivered to the room    Mobility  Bed Mobility Overal bed mobility: Modified Independent Bed Mobility: Supine to Sit   Sidelying to sit: Modified independent (Device/Increase time) Supine to sit: Modified independent (Device/Increase time)     General bed mobility comments: able to sit up at the edge of the bed and lie back  Transfers Overall transfer level: Needs assistance Equipment used: Rolling walker (2 wheeled) (And standing to knee scooter) Transfers: Sit to/from [employer] Sit to Stand: Supervision Stand pivot transfers: Min guard (without physical contact)       General transfer comment: Stood from bed to his knee scooter; good return demo and push up from bed  Ambulation/Gait Ambulation/Gait  assistance: Supervision Gait Distance (Feet): 40 Feet (x2; seated rest break in between) Assistive device:  (knee scooter)       General Gait Details: Slwo, but overall steady; cues to self-monitor for activity tolerance, as well as to relax shoulders   Stairs             Wheelchair Mobility    Modified Rankin (Stroke Patients Only)       Balance     Sitting balance-[name]: Good       Standing balance-[name]: Poor                              Cognition Arousal/Alertness: Awake/alert Behavior During Therapy: WFL for tasks assessed/performed Overall Cognitive Status: Within Functional Limits for tasks assessed                                 General Comments: eager to get moving      Exercises      General Comments        Pertinent Vitals/Pain Pain Assessment: No/denies pain Pain Intervention(s): Monitored during session    Home Living                      Prior Function            PT Goals (current goals can now be found in the care plan section) Acute Rehab PT Goals Patient Stated Goal: To get home when MD says hes cleared PT Goal Formulation: With patient  Time For Goal Achievement: 02/03/21 Potential to Achieve Goals: Good Progress towards PT goals: Progressing toward goals    Frequency    Min 3X/week      PT Plan Current plan remains appropriate    Co-evaluation              AM-PAC PT "6 Clicks" Mobility   Outcome Measure  Help needed turning from your back to your side while in a flat bed without using bedrails?: None Help needed moving from lying on your back to sitting on the side of a flat bed without using bedrails?: None Help needed moving to and from a bed to a chair (including a wheelchair)?: A Little Help needed standing up from a chair using your arms (e.g., wheelchair or bedside chair)?: A Little Help needed to walk in hospital room?: A Little Help needed climbing  3-5 steps with a railing? : A Lot 6 Click Score: 19    End of Session Equipment Utilized During Treatment: Gait belt Activity Tolerance: Patient tolerated treatment well Patient left: in bed;with call bell/phone within reach;with family/visitor present Nurse Communication: Mobility status PT Visit Diagnosis: Unsteadiness on feet (R26.81);Other abnormalities of gait and mobility (R26.89)     Time: 9211-9417 PT Time Calculation (min) (ACUTE ONLY): 37 min  Charges:  $Gait Training: 23-37 mins                     Roney Marion, [place]  Acute Rehabilitation Services Pager [phone] Office [place] 03/13/2020, 1:47 PM

## 2020-03-13 NOTE — Care Management Important Message (Signed)
Important Message  Patient Details  Name: David Fitzgerald MRN: [id number] Date of Birth: October 10, 1960   Medicare Important Message Given:  Yes     Ashlynne Shetterly P Shermika Balthaser 03/13/2020, 11:58 AM

## 2020-03-13 NOTE — Plan of Care (Signed)
  Problem: Activity: Goal: Risk for activity intolerance will decrease Outcome: Progressing   Problem: Coping: Goal: Level of anxiety will decrease Outcome: Progressing   Problem: Safety: Goal: Ability to remain free from injury will improve Outcome: Progressing   Problem: Pain Managment: Goal: General experience of comfort will improve Outcome: Progressing   Problem: Skin Integrity: Goal: Risk for impaired skin integrity will decrease Outcome: Progressing

## 2020-03-14 ENCOUNTER — Other Ambulatory Visit: Payer: Self-pay | Admitting: Physician Assistant

## 2020-03-14 LAB — GLUCOSE, CAPILLARY
Glucose-Capillary: 109 mg/dL — ABNORMAL HIGH (ref 70–99)
Glucose-Capillary: 145 mg/dL — ABNORMAL HIGH (ref 70–99)

## 2020-03-14 MED ORDER — CIPROFLOXACIN HCL 500 MG PO TABS: 500.0000 mg | ORAL_TABLET | Freq: Two times a day (BID) | ORAL | 0 refills | Status: AC

## 2020-03-14 NOTE — Discharge Summary (Signed)
Discharge Diagnoses:  Principal Problem:   Wound dehiscence, traumatic injury repair, left medial ankle  Active Problems:   Obesity hypoventilation syndrome (HCC)   Type 2 diabetes mellitus with diabetic neuropathy (HCC)   Chronic pain syndrome   Polyneuropathy in diabetes ([place])   Chronic anticoagulation   Morbid obesity with BMI of 45.0-49.9, adult ([place])   Hypertension   Diabetes ([place])   Open fracture dislocation of left ankle   Permanent atrial fibrillation (HCC)   Chronic, continuous use of opioids   Vitamin D deficiency   OSA (obstructive sleep apnea)   Surgeries: Procedure(s): DEBRIDEMENT LEFT ANKLE, APPLY SKIN GRAFT on 03/10/2020    Consultants:   Discharged Condition: Improved  Hospital Course: David Fitzgerald is an 60 y.o. male who was admitted 03/08/2020 with a chief complaint of left ankle ulcer, with a final diagnosis of Osteomyelitis Left Ankle.  Patient was brought to the operating room on 03/10/2020 and underwent Procedure(s): DEBRIDEMENT LEFT ANKLE, APPLY SKIN GRAFT.    Patient was given perioperative antibiotics:  Anti-infectives (From admission, onward)   Start     Dose/Rate Route Frequency Ordered Stop   03/14/20 0000  ciprofloxacin (CIPRO) 500 MG tablet        500 mg Oral 2 times daily 03/14/20 1249 03/24/20 2359   03/10/20 1600  ceFAZolin (ANCEF) 3 g in dextrose 5 % 50 mL IVPB        3 g 100 mL/hr over 30 Minutes Intravenous To [facility] 03/09/20 1542 03/10/20 1535   03/08/20 1800  cefTRIAXone (ROCEPHIN) 2 g in sodium chloride 0.9 % 100 mL IVPB        2 g 200 mL/hr over 30 Minutes Intravenous Every 24 hours 03/08/20 1709      .  Patient was given sequential compression devices, early ambulation, and aspirin for DVT prophylaxis.  Recent vital signs:  Patient Vitals for the past 24 hrs:  BP Temp Temp src Pulse Resp SpO2  03/14/20 1051 (!) 158/80 -- -- 88 -- --  03/14/20 0735 (!) 170/75 -- -- 84 17 92 %  03/13/20 2156 (!) 147/71 97.9 F (36.6 C)  Oral (!) 58 16 94 %  03/13/20 1525 (!) 146/96 98.2 F (36.8 C) Oral 99 16 91 %  .  Recent laboratory studies: No results found.  Discharge Medications:   Allergies as of 03/14/2020      Reactions   Penicillins Swelling, Other (See Comments)   CEPHALOSPORIN TOLERANT 01/31/2020- "Allergic," per paperwork from facility   Aspirin Other (See Comments)   Relative contraindication due to hematochezia while on it- "Allergic," per paperwork from facility   Bee Venom Other (See Comments)      Medication List    STOP taking these medications   acetaminophen 325 MG tablet Commonly known as: TYLENOL     TAKE these medications   ascorbic acid 1000 MG tablet Commonly known as: VITAMIN C Take 1 tablet (1,000 mg total) by mouth daily.   Azelastine-Fluticasone 137-50 MCG/ACT Susp Place 2 sprays into both nostrils 2 (two) times daily.   ciprofloxacin 500 MG tablet Commonly known as: Cipro Take 1 tablet (500 mg total) by mouth 2 (two) times daily for 10 days.   cyanocobalamin 1000 MCG tablet Take 1 tablet (1,000 mcg total) by mouth daily.   dicyclomine 20 MG tablet Commonly known as: BENTYL Take 20 mg by mouth 2 (two) times daily.   docusate sodium 100 MG capsule Commonly known as: COLACE Take 1 capsule (100 mg total)  by mouth 2 (two) times daily.   DULoxetine 60 MG capsule Commonly known as: CYMBALTA Take 60 mg by mouth daily.   Eliquis 5 MG Tabs tablet Generic drug: apixaban TAKE 1 TABLET BY MOUTH TWICE DAILY What changed: how much to take   EPINEPHrine 0.3 mg/0.3 mL Soaj injection Commonly known as: EPI-PEN Use for life-threatening allergic reactions What changed:   how much to take  how to take this  when to take this  reasons to take this  additional instructions   furosemide 40 MG tablet Commonly known as: LASIX Take 40 mg by mouth daily as needed for fluid or edema.   gabapentin 300 MG capsule Commonly known as: NEURONTIN Take 600 mg by mouth 3 (three)  times daily.   metFORMIN 500 MG tablet Commonly known as: GLUCOPHAGE Take 500 mg by mouth daily with breakfast.   metoprolol tartrate 25 MG tablet Commonly known as: LOPRESSOR Take 1 tablet (25 mg total) by mouth at bedtime. What changed: Another medication with the same name was changed. Make sure you understand how and when to take each.   metoprolol tartrate 50 MG tablet Commonly known as: LOPRESSOR Take 1 tablet (50 mg total) by mouth daily. What changed: when to take this   naloxone 4 MG/0.1ML Liqd nasal spray kit Commonly known as: NARCAN Place 1 spray into the nose See admin instructions. USE 1 SPRAY NASALLY AS NEEDED FOR OPIOID OVERDOSE EMERGENCY   naproxen sodium 220 MG tablet Commonly known as: ALEVE Take 440 mg by mouth daily as needed (for headaches).   nitroGLYCERIN 0.4 MG SL tablet Commonly known as: NITROSTAT Place 0.4 mg under the tongue every 5 (five) minutes as needed for chest pain.   ondansetron 4 MG disintegrating tablet Commonly known as: ZOFRAN-ODT Take 4 mg by mouth every 8 (eight) hours as needed for nausea or vomiting.   oxyCODONE-acetaminophen 7.5-325 MG tablet Commonly known as: PERCOCET Take 1 tablet by mouth every 8 (eight) hours as needed for severe pain.   pantoprazole 40 MG tablet Commonly known as: PROTONIX Take 40 mg by mouth daily.   simvastatin 20 MG tablet Commonly known as: ZOCOR Take 20 mg by mouth at bedtime.   tamsulosin 0.4 MG Caps capsule Commonly known as: FLOMAX Take 0.4 mg by mouth at bedtime.   traMADol 50 MG tablet Commonly known as: ULTRAM Take 1 tablet (50 mg total) by mouth every 6 (six) hours as needed for moderate pain or severe pain.   Vitamin D 125 MCG (5000 UT) Caps Take 1 capsule by mouth daily. What changed: how much to take       Diagnostic Studies: DG Ankle Left Port  Result Date: 02/14/2020 CLINICAL DATA:  59 year old male status post ORIF the left ankle. EXAM: PORTABLE LEFT ANKLE - 2 VIEW  COMPARISON:  Radiograph dated 02/01/2020. FINDINGS: Postsurgical changes of ORIF of the left ankle with distal fibular sideplate fixation and screws as well as 2 K-wires extending from the plantar aspect of the foot into the distal tibia. The hardware appears intact. The alignment is maintained. Overall no significant interval change compared to the prior radiograph. A cast is noted over the ankle. IMPRESSION: ORIF of the left ankle. No significant interval change compared to the prior radiograph. Electronically Signed   By: Anner Crete M.D.   On: 02/14/2020 19:10    Patient benefited maximally from their hospital stay and there were no complications.     Disposition: Discharge disposition: 01-Home or Self Care  Discharge Instructions    Call MD / Call 911   Complete by: As directed    If you experience chest pain or shortness of breath, CALL 911 and be transported to the hospital emergency room.  If you develope a fever above 101 F, pus (white drainage) or increased drainage or redness at the wound, or calf pain, call your surgeon's office.   Constipation Prevention   Complete by: As directed    Drink plenty of fluids.  Prune juice may be helpful.  You may use a stool softener, such as Colace (over the counter) 100 mg twice a day.  Use MiraLax (over the counter) for constipation as needed.   Diet - low sodium heart healthy   Complete by: As directed    Discharge instructions   Complete by: As directed    Elevate left leg. Nonweightbearing left. Call if wound vac alarms   Increase activity slowly as tolerated   Complete by: As directed    Negative Pressure Wound Therapy - Incisional   Complete by: As directed    Show patient how to attach prevena vac. Should contact office if vac alarms      Follow-up Information    Benjie Ricketson, David Fitzgerald, [place] In 1 week.   Specialty: Orthopedic Surgery Contact information: [address] [place] [address] [phone]                 Signed: Bevely Fitzgerald David Fitzgerald 03/14/2020, 12:51 PM

## 2020-03-14 NOTE — Progress Notes (Signed)
Discharge package printed and instructions given to patient. Patient verbalizes understanding. 

## 2020-03-14 NOTE — Plan of Care (Signed)
  Problem: Safety: Goal: Ability to remain free from injury will improve Outcome: Progressing   Problem: Skin Integrity: Goal: Risk for impaired skin integrity will decrease Outcome: Progressing   Problem: Pain Managment: Goal: General experience of comfort will improve Outcome: Progressing   

## 2020-03-14 NOTE — Plan of Care (Signed)

## 2020-03-14 NOTE — Progress Notes (Addendum)
Patient is status post skin grafting debridement of his lower leg wound.  He is comfortable this morning.   Vital signs stable afebrile wound VAC is functioning 0 cc in the canister.  Cultures show gram-negative rods and rare.  Awaiting sensitivities.   Discussed with patient he may be discharged hopefully later today if we can identify the specific organism.  Sensitivities identified the bacteria is Enterobactercloace sensitive to ciprofloxacin.  We will plan on discharging patient with 2 weeks of Cipro.  We will follow-up in our office in 1 week

## 2020-03-15 ENCOUNTER — Other Ambulatory Visit: Payer: Self-pay

## 2020-03-15 LAB — AEROBIC/ANAEROBIC CULTURE W GRAM STAIN (SURGICAL/DEEP WOUND)

## 2020-03-15 NOTE — Patient Outreach (Addendum)
[facility]) Care Management  03/15/2020  David Fitzgerald 1960-04-11 [id number]   Referral Date: 03/14/20 Referral Source: Hospital liaison Referral Reason: Recent hospital discharge-MD completes [facility]  Outreach Attempt: spoke with patient.  He reports he is doing ok this morning since being at home. He reports having the wound vac to his left ankle area.  He acknowledges follow up appointment with the orthopedic surgeon concerning wound and wound vac.  He states he has the office information if he needs to call. He states that home health was not ordered at this time as after evaluation there were no needs.    Social: Patient lives alone but has mother who lives next door. Patient reporting he gets around with his knee scooter and is independent with care.  He states that his mom will be available to assist with meals and things in the home.   Past medical history 4 weeks s/p ORIF Open L trimalleolar ankle frature dislocation with deep infection of traumatic medial wound, DM, Afib on chronic anticoagulation, chronic pain on chronic opioid treatment.  Medication: Patient reports having his medications and has no questions or concerns about medications.   Consent: Discussed [facility] services and support ongoing. Patient declined for right now but agreeable to received letter and brochure for future reference.     Plan: RN CM will close case.     [name], RN, MSN [facility] Care Management Care Management Coordinator Direct Line [phone] Toll Free: [phone]  Fax: [phone]

## 2020-03-16 ENCOUNTER — Ambulatory Visit (INDEPENDENT_AMBULATORY_CARE_PROVIDER_SITE_OTHER): Payer: Medicare HMO | Admitting: Physician Assistant

## 2020-03-16 ENCOUNTER — Encounter: Payer: Self-pay | Admitting: Physician Assistant

## 2020-03-16 DIAGNOSIS — S82892B Other fracture of left lower leg, initial encounter for open fracture type I or II: Secondary | ICD-10-CM

## 2020-03-16 NOTE — Progress Notes (Signed)
Office Visit Note   Patient: David Fitzgerald           Date of Birth: 1960/08/24           MRN: [id number] Visit Date: 03/16/2020              Requested by: Angelina Sheriff, MD [address] [place],  [place] [address] PCP: Angelina Sheriff, MD  Chief Complaint  Patient presents with  . Left Ankle - Routine Post Op    03/10/20 debridement left ankle application of Kerecis skin graft.       HPI: Patient is 6 days status post debridement left ankle with kerecis skin graft.  He was doing well but the VAC pulled out last night when it caught in his knee scooter.  Assessment & Plan: Visit Diagnoses: No diagnosis found.  Plan: We will begin twice weekly compression wrap with silver alginate over the wound.  Should continue to take the Cipro  Follow-Up Instructions: No follow-ups on file.   Ortho Exam  Patient is alert, oriented, no adenopathy, well-dressed, normal affect, normal respiratory effort. Medial ankle wound moderate soft tissue swelling but no cellulitis.  Status post skin grafting.  No foul odor.  Some bloody drainage.  No ascending cellulitis  Imaging: No results found.    Labs: Lab Results  Component Value Date   HGBA1C 6.0 (H) 03/08/2020   HGBA1C 6.0 (H) 02/04/2020   REPTSTATUS 03/15/2020 FINAL 03/10/2020   GRAMSTAIN  03/10/2020    MODERATE WBC PRESENT, PREDOMINANTLY PMN NO ORGANISMS SEEN    CULT  03/10/2020    RARE ENTEROBACTER CLOACAE CRITICAL RESULT CALLED TO, READ BACK BY AND VERIFIED WITH: RN TGilford Rile [phone] FCP NO ANAEROBES ISOLATED Performed at [facility], [place] [address], [place], [place] [address]    [place] 03/10/2020     Lab Results  Component Value Date   ALBUMIN 2.7 (L) 03/10/2020   ALBUMIN 2.9 (L) 03/08/2020   ALBUMIN 2.9 (L) 02/16/2020    Lab Results  Component Value Date   MG 2.1 02/08/2020   MG 2.0 02/04/2020   MG 1.9 02/03/2020   Lab Results  Component Value Date   VD25OH 19.57  (L) 03/08/2020   VD25OH 16.36 (L) 02/16/2020    No results found for: PREALBUMIN CBC EXTENDED Latest Ref Rng & Units 03/08/2020 02/16/2020 02/10/2020  WBC 4.0 - 10.5 K/uL 10.6(H) 8.4 10.4  RBC 4.22 - 5.81 MIL/uL 4.50 4.16(L) 4.13(L)  HGB 13.0 - 17.0 g/dL 12.7(L) 12.4(L) 12.3(L)  HCT 39.0 - 52.0 % 42.0 38.6(L) 38.7(L)  PLT 150 - 400 K/uL 460(H) 302 322  NEUTROABS 1.7 - 7.7 K/uL 7.7 - -  LYMPHSABS 0.7 - 4.0 K/uL 1.5 - -     There is no height or weight on file to calculate BMI.  Orders:  No orders of the defined types were placed in this encounter.  No orders of the defined types were placed in this encounter.    Procedures: No procedures performed  Clinical Data: No additional findings.  ROS:  All other systems negative, except as noted in the HPI. Review of Systems  Objective: Vital Signs: There were no vitals taken for this visit.  Specialty Comments:  No specialty comments available.  PMFS History: Patient Active Problem List   Diagnosis Date Noted  . OSA (obstructive sleep apnea) 03/09/2020  . Wound dehiscence, traumatic injury repair, left medial ankle  03/08/2020  . Vitamin D  deficiency 02/16/2020  . Chronic, continuous use of opioids 02/15/2020  . Open fracture dislocation of left ankle 01/31/2020  . Ankle fracture 01/31/2020  . Permanent atrial fibrillation ([place])   . Hypertension   . Diabetes ([place])   . Bee sting allergy   . Hypotension 12/25/2017  . Chronic anticoagulation 03/07/2017  . Coronary artery calcification seen on CT scan 02/07/2017  . CAD in native artery 02/07/2017  . Persistent atrial fibrillation ([place]) 02/06/2017  . Anxiety 02/06/2017  . Arthritis 02/06/2017  . Pernicious anemia 02/06/2017  . Allergy to pollen 04/06/2015  . OSA treated with BiPAP 12/14/2014  . Obesity hypoventilation syndrome ([place]) 12/14/2014  . Morbid obesity with BMI of 40.0-44.9, adult ([place]) 12/14/2014  . Hypertensive heart disease 12/14/2014  . Hyperlipidemia,  mixed 12/14/2014  . Type 2 diabetes mellitus with diabetic neuropathy ([place]) 12/14/2014  . Chronic venous insufficiency 12/14/2014  . Edema 12/14/2014  . Morbid obesity ([place]) 12/14/2014  . Bell's palsy 02/24/2013  . Polyneuropathy in diabetes ([place]) 02/24/2013  . Syndrome affecting cervical region 02/24/2013  . Trigeminal neuralgia 02/24/2013  . Lumbar pain with radiation down left leg 07/09/2012  . BMI 45.0-49.9, adult ([place]) 07/09/2012  . Chronic pain syndrome 07/09/2012  . Morbid obesity with BMI of 45.0-49.9, adult ([place]) 07/09/2012   Past Medical History:  Diagnosis Date  . A-fib ([place])   . Allergy to pollen 04/06/2015  . Anxiety 02/06/2017  . Arthritis 02/06/2017  . Bee sting allergy    wasp / mixed vespid  . Bell's palsy 02/24/2013  . Chronic venous insufficiency 12/14/2014  . Chronic, continuous use of opioids 02/15/2020  . Diabetes ([place])   . Edema 12/14/2014  . Hyperlipidemia, mixed 12/14/2014  . Hypertension   . Lumbar pain with radiation down left leg 07/09/2012  . Morbid obesity ([place]) 12/14/2014  . Morbid obesity with BMI of 45.0-49.9, adult ([place]) 07/09/2012  . Obesity hypoventilation syndrome ([place]) 12/14/2014  . OSA (obstructive sleep apnea) 03/09/2020  . OSA treated with BiPAP 12/14/2014  . Pain syndrome, chronic 07/09/2012  . Pernicious anemia 02/06/2017  . Polyneuropathy in diabetes ([place]) 02/24/2013  . Syndrome affecting cervical region 02/24/2013  . Trigeminal neuralgia 02/24/2013  . Type 2 diabetes mellitus with diabetic neuropathy ([place]) 12/14/2014  . Vitamin D deficiency 02/16/2020  . Wound dehiscence, traumatic injury repair, left medial ankle  03/08/2020    Family History  Problem Relation Age of Onset  . Breast cancer Mother   . Heart disease Father   . CAD Father   . Atrial fibrillation Father   . Stomach cancer Paternal Aunt   . Heart disease Paternal Uncle   . Breast cancer Maternal Grandmother     Past Surgical History:  Procedure Laterality Date  . ADENOIDECTOMY    .  BACK SURGERY  2002 /2003  . CARPAL TUNNEL RELEASE Bilateral   . I & D EXTREMITY Left 01/31/2020   Procedure: IRRIGATION AND DEBRIDEMENT ANKLE;  Surgeon: Altamese [place], MD;  Location: [facility];  Service: Orthopedics;  Laterality: Left;  . I & D EXTREMITY Left 03/10/2020   Procedure: DEBRIDEMENT LEFT ANKLE, APPLY SKIN GRAFT;  Surgeon: [name], MD;  Location: [facility];  Service: Orthopedics;  Laterality: Left;  . KNEE SURGERY Left 2005  . ORIF ANKLE FRACTURE Left 01/31/2020  . ORIF ANKLE FRACTURE Left 01/31/2020   Procedure: OPEN REDUCTION INTERNAL FIXATION (ORIF) ANKLE FRACTURE;  Surgeon: Altamese [place], MD;  Location: [facility];  Service: Orthopedics;  Laterality: Left;  . SHOULDER SURGERY  Left 1998  . TONSILLECTOMY     Social History   Occupational History  . Not on file  Tobacco Use  . Smoking status: Former Smoker    Packs/day: 3.00    Years: 35.00    Pack years: 105.00    Types: Cigarettes    Quit date: 04/13/2006    Years since quitting: 13.9  . Smokeless tobacco: Never Used  Vaping Use  . Vaping Use: Never used  Substance and Sexual Activity  . Alcohol use: No    Alcohol/week: 0.0 standard drinks  . Drug use: No  . Sexual activity: Not on file

## 2020-03-21 ENCOUNTER — Ambulatory Visit (INDEPENDENT_AMBULATORY_CARE_PROVIDER_SITE_OTHER): Payer: Medicare HMO | Admitting: Physician Assistant

## 2020-03-21 ENCOUNTER — Encounter: Payer: Self-pay | Admitting: Physician Assistant

## 2020-03-21 DIAGNOSIS — S82892B Other fracture of left lower leg, initial encounter for open fracture type I or II: Secondary | ICD-10-CM

## 2020-03-21 NOTE — Progress Notes (Signed)
Office Visit Note   Patient: David Fitzgerald           Date of Birth: 20-Apr-1960           MRN: [id number] Visit Date: 03/21/2020              Requested by: Angelina Sheriff, MD [address] [place],  [place] [address] PCP: Angelina Sheriff, MD  Chief Complaint  Patient presents with  . Left Ankle - Routine Post Op    03/10/20 debridement left ankle Kerecis skin graft       HPI: Patient presents today 2 weeks status post debridement of left ankle.  This was done with Kerecis wound graft.  He has been doing twice weekly Profore dressings  Assessment & Plan: Visit Diagnoses: No diagnosis found.  Plan: We will follow up Friday morning for repeat Profore dressing.  Then follow-up the following Tuesday for evaluation by Dr. Sharol Given  Follow-Up Instructions: No follow-ups on file.   Ortho Exam  Patient is alert, oriented, no adenopathy, well-dressed, normal affect, normal respiratory effort. Examination there is no cellulitis there is no foul odor compartments are soft surgical sutures are in place he does have moderate drainage but there is no odor to it no ascending cellulitis  Imaging: No results found. No images are attached to the encounter.  Labs: Lab Results  Component Value Date   HGBA1C 6.0 (H) 03/08/2020   HGBA1C 6.0 (H) 02/04/2020   REPTSTATUS 03/15/2020 FINAL 03/10/2020   GRAMSTAIN  03/10/2020    MODERATE WBC PRESENT, PREDOMINANTLY PMN NO ORGANISMS SEEN    CULT  03/10/2020    RARE ENTEROBACTER CLOACAE CRITICAL RESULT CALLED TO, READ BACK BY AND VERIFIED WITH: RN TGilford Rile [phone] FCP NO ANAEROBES ISOLATED Performed at [facility], [place] [address] [place], [place] [address]    [place] 03/10/2020     Lab Results  Component Value Date   ALBUMIN 2.7 (L) 03/10/2020   ALBUMIN 2.9 (L) 03/08/2020   ALBUMIN 2.9 (L) 02/16/2020    Lab Results  Component Value Date   MG 2.1 02/08/2020   MG 2.0 02/04/2020   MG 1.9  02/03/2020   Lab Results  Component Value Date   VD25OH 19.57 (L) 03/08/2020   VD25OH 16.36 (L) 02/16/2020    No results found for: PREALBUMIN CBC EXTENDED Latest Ref Rng & Units 03/08/2020 02/16/2020 02/10/2020  WBC 4.0 - 10.5 K/uL 10.6(H) 8.4 10.4  RBC 4.22 - 5.81 MIL/uL 4.50 4.16(L) 4.13(L)  HGB 13.0 - 17.0 g/dL 12.7(L) 12.4(L) 12.3(L)  HCT 39.0 - 52.0 % 42.0 38.6(L) 38.7(L)  PLT 150 - 400 K/uL 460(H) 302 322  NEUTROABS 1.7 - 7.7 K/uL 7.7 - -  LYMPHSABS 0.7 - 4.0 K/uL 1.5 - -     There is no height or weight on file to calculate BMI.  Orders:  No orders of the defined types were placed in this encounter.  No orders of the defined types were placed in this encounter.    Procedures: No procedures performed  Clinical Data: No additional findings.  ROS:  All other systems negative, except as noted in the HPI. Review of Systems  Objective: Vital Signs: There were no vitals taken for this visit.  Specialty Comments:  No specialty comments available.  PMFS History: Patient Active Problem List   Diagnosis Date Noted  . OSA (obstructive sleep apnea) 03/09/2020  . Wound dehiscence, traumatic injury repair, left medial ankle  03/08/2020  . Vitamin D deficiency 02/16/2020  . Chronic, continuous use of opioids 02/15/2020  . Open fracture dislocation of left ankle 01/31/2020  . Ankle fracture 01/31/2020  . Permanent atrial fibrillation ([place])   . Hypertension   . Diabetes ([place])   . Bee sting allergy   . Hypotension 12/25/2017  . Chronic anticoagulation 03/07/2017  . Coronary artery calcification seen on CT scan 02/07/2017  . CAD in native artery 02/07/2017  . Persistent atrial fibrillation ([place]) 02/06/2017  . Anxiety 02/06/2017  . Arthritis 02/06/2017  . Pernicious anemia 02/06/2017  . Allergy to pollen 04/06/2015  . OSA treated with BiPAP 12/14/2014  . Obesity hypoventilation syndrome ([place]) 12/14/2014  . Morbid obesity with BMI of 40.0-44.9, adult ([place]) 12/14/2014   . Hypertensive heart disease 12/14/2014  . Hyperlipidemia, mixed 12/14/2014  . Type 2 diabetes mellitus with diabetic neuropathy ([place]) 12/14/2014  . Chronic venous insufficiency 12/14/2014  . Edema 12/14/2014  . Morbid obesity ([place]) 12/14/2014  . Bell's palsy 02/24/2013  . Polyneuropathy in diabetes ([place]) 02/24/2013  . Syndrome affecting cervical region 02/24/2013  . Trigeminal neuralgia 02/24/2013  . Lumbar pain with radiation down left leg 07/09/2012  . BMI 45.0-49.9, adult ([place]) 07/09/2012  . Chronic pain syndrome 07/09/2012  . Morbid obesity with BMI of 45.0-49.9, adult ([place]) 07/09/2012   Past Medical History:  Diagnosis Date  . A-fib ([place])   . Allergy to pollen 04/06/2015  . Anxiety 02/06/2017  . Arthritis 02/06/2017  . Bee sting allergy    wasp / mixed vespid  . Bell's palsy 02/24/2013  . Chronic venous insufficiency 12/14/2014  . Chronic, continuous use of opioids 02/15/2020  . Diabetes ([place])   . Edema 12/14/2014  . Hyperlipidemia, mixed 12/14/2014  . Hypertension   . Lumbar pain with radiation down left leg 07/09/2012  . Morbid obesity ([place]) 12/14/2014  . Morbid obesity with BMI of 45.0-49.9, adult ([place]) 07/09/2012  . Obesity hypoventilation syndrome ([place]) 12/14/2014  . OSA (obstructive sleep apnea) 03/09/2020  . OSA treated with BiPAP 12/14/2014  . Pain syndrome, chronic 07/09/2012  . Pernicious anemia 02/06/2017  . Polyneuropathy in diabetes ([place]) 02/24/2013  . Syndrome affecting cervical region 02/24/2013  . Trigeminal neuralgia 02/24/2013  . Type 2 diabetes mellitus with diabetic neuropathy ([place]) 12/14/2014  . Vitamin D deficiency 02/16/2020  . Wound dehiscence, traumatic injury repair, left medial ankle  03/08/2020    Family History  Problem Relation Age of Onset  . Breast cancer Mother   . Heart disease Father   . CAD Father   . Atrial fibrillation Father   . Stomach cancer Paternal Aunt   . Heart disease Paternal Uncle   . Breast cancer Maternal Grandmother     Past Surgical  History:  Procedure Laterality Date  . ADENOIDECTOMY    . BACK SURGERY  2002 /2003  . CARPAL TUNNEL RELEASE Bilateral   . I & D EXTREMITY Left 01/31/2020   Procedure: IRRIGATION AND DEBRIDEMENT ANKLE;  Surgeon: Altamese Joplin, MD;  Location: [facility];  Service: Orthopedics;  Laterality: Left;  . I & D EXTREMITY Left 03/10/2020   Procedure: DEBRIDEMENT LEFT ANKLE, APPLY SKIN GRAFT;  Surgeon: [name], MD;  Location: [facility];  Service: Orthopedics;  Laterality: Left;  . KNEE SURGERY Left 2005  . ORIF ANKLE FRACTURE Left 01/31/2020  . ORIF ANKLE FRACTURE Left 01/31/2020   Procedure: OPEN REDUCTION INTERNAL FIXATION (ORIF) ANKLE FRACTURE;  Surgeon: Altamese [place], MD;  Location: [facility];  Service: Orthopedics;  Laterality:  Left;  . SHOULDER SURGERY Left 1998  . TONSILLECTOMY     Social History   Occupational History  . Not on file  Tobacco Use  . Smoking status: Former Smoker    Packs/day: 3.00    Years: 35.00    Pack years: 105.00    Types: Cigarettes    Quit date: 04/13/2006    Years since quitting: 13.9  . Smokeless tobacco: Never Used  Vaping Use  . Vaping Use: Never used  Substance and Sexual Activity  . Alcohol use: No    Alcohol/week: 0.0 standard drinks  . Drug use: No  . Sexual activity: Not on file

## 2020-03-23 DIAGNOSIS — I4891 Unspecified atrial fibrillation: Secondary | ICD-10-CM | POA: Insufficient documentation

## 2020-03-24 ENCOUNTER — Encounter: Payer: Self-pay | Admitting: Physician Assistant

## 2020-03-24 ENCOUNTER — Ambulatory Visit (INDEPENDENT_AMBULATORY_CARE_PROVIDER_SITE_OTHER): Payer: Medicare HMO | Admitting: Physician Assistant

## 2020-03-24 DIAGNOSIS — S82892B Other fracture of left lower leg, initial encounter for open fracture type I or II: Secondary | ICD-10-CM

## 2020-03-24 NOTE — Progress Notes (Signed)
Office Visit Note   Patient: David Fitzgerald           Date of Birth: Jan 18, 1961           MRN: [id number] Visit Date: 03/24/2020              Requested by: Angelina Sheriff, MD [address] [place],  [place] [id number] PCP: Angelina Sheriff, MD  Chief Complaint  Patient presents with  . Left Ankle - Routine Post Op    03/10/20 debridement left ankle Kerecis graft       HPI: Patient is 2 weeks status post irrigation and debridement with placement of kerecis graft.  Assessment & Plan: Visit Diagnoses: No diagnosis found.  Plan: Emphasized the importance of elevating his legs we will rewrap today. Follow-up with this patient will come in on Tuesday I would expect she would need some perhaps aggressive debridement in the office if tolerated. Believe it would be far enough out from grafting at that time  Follow-Up Instructions: No follow-ups on file.   Ortho Exam  Patient is alert, oriented, no adenopathy, well-dressed, normal affect, normal respiratory effort. He does have no ascending cellulitis some mild erythema around the wound edges significant amount of fibrinous type tissue significant serous drainage no foul odor.  Imaging: No results found.    Labs: Lab Results  Component Value Date   HGBA1C 6.0 (H) 03/08/2020   HGBA1C 6.0 (H) 02/04/2020   REPTSTATUS 03/15/2020 FINAL 03/10/2020   GRAMSTAIN  03/10/2020    MODERATE WBC PRESENT, PREDOMINANTLY PMN NO ORGANISMS SEEN    CULT  03/10/2020    RARE ENTEROBACTER CLOACAE CRITICAL RESULT CALLED TO, READ BACK BY AND VERIFIED WITH: RN TGilford Rile [phone] FCP NO ANAEROBES ISOLATED Performed at [facility], [place] [address], [place], [place] [address]    [place] 03/10/2020     Lab Results  Component Value Date   ALBUMIN 2.7 (L) 03/10/2020   ALBUMIN 2.9 (L) 03/08/2020   ALBUMIN 2.9 (L) 02/16/2020    Lab Results  Component Value Date   MG 2.1 02/08/2020   MG 2.0 02/04/2020   MG  1.9 02/03/2020   Lab Results  Component Value Date   VD25OH 19.57 (L) 03/08/2020   VD25OH 16.36 (L) 02/16/2020    No results found for: PREALBUMIN CBC EXTENDED Latest Ref Rng & Units 03/08/2020 02/16/2020 02/10/2020  WBC 4.0 - 10.5 K/uL 10.6(H) 8.4 10.4  RBC 4.22 - 5.81 MIL/uL 4.50 4.16(L) 4.13(L)  HGB 13.0 - 17.0 g/dL 12.7(L) 12.4(L) 12.3(L)  HCT 39.0 - 52.0 % 42.0 38.6(L) 38.7(L)  PLT 150 - 400 K/uL 460(H) 302 322  NEUTROABS 1.7 - 7.7 K/uL 7.7 - -  LYMPHSABS 0.7 - 4.0 K/uL 1.5 - -     There is no height or weight on file to calculate BMI.  Orders:  No orders of the defined types were placed in this encounter.  No orders of the defined types were placed in this encounter.    Procedures: No procedures performed  Clinical Data: No additional findings.  ROS:  All other systems negative, except as noted in the HPI. Review of Systems  Objective: Vital Signs: There were no vitals taken for this visit.  Specialty Comments:  No specialty comments available.  PMFS History: Patient Active Problem List   Diagnosis Date Noted  . A-fib ([place])   . OSA (obstructive sleep apnea) 03/09/2020  . Wound dehiscence, traumatic injury repair,  left medial ankle  03/08/2020  . Vitamin D deficiency 02/16/2020  . Chronic, continuous use of opioids 02/15/2020  . Open fracture dislocation of left ankle 01/31/2020  . Ankle fracture 01/31/2020  . Permanent atrial fibrillation ([place])   . Hypertension   . Diabetes ([place])   . Bee sting allergy   . Hypotension 12/25/2017  . Chronic anticoagulation 03/07/2017  . Coronary artery calcification seen on CT scan 02/07/2017  . CAD in native artery 02/07/2017  . Persistent atrial fibrillation ([place]) 02/06/2017  . Anxiety 02/06/2017  . Arthritis 02/06/2017  . Pernicious anemia 02/06/2017  . Allergy to pollen 04/06/2015  . OSA treated with BiPAP 12/14/2014  . Obesity hypoventilation syndrome ([place]) 12/14/2014  . Morbid obesity with BMI of 40.0-44.9,  adult ([place]) 12/14/2014  . Hypertensive heart disease 12/14/2014  . Hyperlipidemia, mixed 12/14/2014  . Type 2 diabetes mellitus with diabetic neuropathy ([place]) 12/14/2014  . Chronic venous insufficiency 12/14/2014  . Edema 12/14/2014  . Morbid obesity ([place]) 12/14/2014  . Bell's palsy 02/24/2013  . Polyneuropathy in diabetes ([place]) 02/24/2013  . Syndrome affecting cervical region 02/24/2013  . Trigeminal neuralgia 02/24/2013  . Lumbar pain with radiation down left leg 07/09/2012  . BMI 45.0-49.9, adult ([place]) 07/09/2012  . Chronic pain syndrome 07/09/2012  . Morbid obesity with BMI of 45.0-49.9, adult ([place]) 07/09/2012  . Pain syndrome, chronic 07/09/2012   Past Medical History:  Diagnosis Date  . A-fib ([place])   . Allergy to pollen 04/06/2015  . Anxiety 02/06/2017  . Arthritis 02/06/2017  . Bee sting allergy    wasp / mixed vespid  . Bell's palsy 02/24/2013  . Chronic venous insufficiency 12/14/2014  . Chronic, continuous use of opioids 02/15/2020  . Diabetes ([place])   . Edema 12/14/2014  . Hyperlipidemia, mixed 12/14/2014  . Hypertension   . Lumbar pain with radiation down left leg 07/09/2012  . Morbid obesity ([place]) 12/14/2014  . Morbid obesity with BMI of 45.0-49.9, adult ([place]) 07/09/2012  . Obesity hypoventilation syndrome ([place]) 12/14/2014  . OSA (obstructive sleep apnea) 03/09/2020  . OSA treated with BiPAP 12/14/2014  . Pain syndrome, chronic 07/09/2012  . Pernicious anemia 02/06/2017  . Polyneuropathy in diabetes ([place]) 02/24/2013  . Syndrome affecting cervical region 02/24/2013  . Trigeminal neuralgia 02/24/2013  . Type 2 diabetes mellitus with diabetic neuropathy ([place]) 12/14/2014  . Vitamin D deficiency 02/16/2020  . Wound dehiscence, traumatic injury repair, left medial ankle  03/08/2020    Family History  Problem Relation Age of Onset  . Breast cancer Mother   . Heart disease Father   . CAD Father   . Atrial fibrillation Father   . Stomach cancer Paternal Aunt   . Heart disease Paternal Uncle    . Breast cancer Maternal Grandmother     Past Surgical History:  Procedure Laterality Date  . ADENOIDECTOMY    . BACK SURGERY  2002 /2003  . CARPAL TUNNEL RELEASE Bilateral   . I & D EXTREMITY Left 01/31/2020   Procedure: IRRIGATION AND DEBRIDEMENT ANKLE;  Surgeon: Altamese Nambe, MD;  Location: [facility];  Service: Orthopedics;  Laterality: Left;  . I & D EXTREMITY Left 03/10/2020   Procedure: DEBRIDEMENT LEFT ANKLE, APPLY SKIN GRAFT;  Surgeon: [name], MD;  Location: [facility];  Service: Orthopedics;  Laterality: Left;  . KNEE SURGERY Left 2005  . ORIF ANKLE FRACTURE Left 01/31/2020  . ORIF ANKLE FRACTURE Left 01/31/2020   Procedure: OPEN REDUCTION INTERNAL FIXATION (ORIF) ANKLE FRACTURE;  Surgeon: Altamese Oolitic,  MD;  Location: [facility];  Service: Orthopedics;  Laterality: Left;  . SHOULDER SURGERY Left 1998  . TONSILLECTOMY     Social History   Occupational History  . Not on file  Tobacco Use  . Smoking status: Former Smoker    Packs/day: 3.00    Years: 35.00    Pack years: 105.00    Types: Cigarettes    Quit date: 04/13/2006    Years since quitting: 13.9  . Smokeless tobacco: Never Used  Vaping Use  . Vaping Use: Never used  Substance and Sexual Activity  . Alcohol use: No    Alcohol/week: 0.0 standard drinks  . Drug use: No  . Sexual activity: Not on file

## 2020-03-28 ENCOUNTER — Other Ambulatory Visit: Payer: Self-pay | Admitting: Thoracic Surgery (Cardiothoracic Vascular Surgery)

## 2020-03-28 ENCOUNTER — Ambulatory Visit (INDEPENDENT_AMBULATORY_CARE_PROVIDER_SITE_OTHER): Payer: Medicare HMO | Admitting: Orthopedic Surgery

## 2020-03-28 DIAGNOSIS — S82892B Other fracture of left lower leg, initial encounter for open fracture type I or II: Secondary | ICD-10-CM

## 2020-03-29 NOTE — Progress Notes (Signed)
Cardiology Office Note:    Date:  03/30/2020   ID:  David Fitzgerald, DOB 1960-07-08, MRN [id number]  PCP:  David Sheriff, MD  Cardiologist:  David More, MD    Referring MD: David Sheriff, MD    ASSESSMENT:    1. Persistent atrial fibrillation ([place])   2. Chronic anticoagulation   3. Hypertensive heart disease without heart failure   4. Hypotension due to drugs    PLAN:    In order of problems listed above:  1. Stable atrial fibrillation rate is controlled was seen the patient put a sat monitor his heart rate is consistently 8090 bpm we will continue his current beta-blocker and Eliquis.   Stable BP at target Him to stop his alpha-blocker tamsulosin which may have contributed to his orthostatic symptoms and injury  Next appointment: 6 months   Medication Adjustments/Labs and Tests Ordered: Current medicines are reviewed at length with the patient today.  Concerns regarding medicines are outlined above.  No orders of the defined types were placed in this encounter.  No orders of the defined types were placed in this encounter.   Chief Complaint  Patient presents with  . Follow-up  . Hypertension  . Atrial Fibrillation  . Hypotension    History of Present Illness:    David Fitzgerald is a 60 y.o. male with a hx of persistent atrial fibrillation chronically anticoagulated previous hypotension related to cardiac medications and hypertensive heart disease without heart failure and hyperlipidemia last seen 09/28/2019.  Other problems include obstructive sleep apnea obesity hypoventilation syndrome.. Compliance with diet, lifestyle and medications: Yes  He has had surgery and interventions for compound fracture left lower extremity above the knee. EKG 01/31/2020 showed atrial fibrillation controlled ventricular rate nonspecific conduction delay 03/10/2020 creatinine 1.1 potassium 3.8 GFR greater 60 cc Past Medical History:  Diagnosis Date  . A-fib ([place])   .  Allergy to pollen 04/06/2015  . Anxiety 02/06/2017  . Arthritis 02/06/2017  . Bee sting allergy    wasp / mixed vespid  . Bell's palsy 02/24/2013  . Chronic venous insufficiency 12/14/2014  . Chronic, continuous use of opioids 02/15/2020  . Diabetes ([place])   . Edema 12/14/2014  . Hyperlipidemia, mixed 12/14/2014  . Hypertension   . Lumbar pain with radiation down left leg 07/09/2012  . Morbid obesity ([place]) 12/14/2014  . Morbid obesity with BMI of 45.0-49.9, adult ([place]) 07/09/2012  . Obesity hypoventilation syndrome ([place]) 12/14/2014  . OSA (obstructive sleep apnea) 03/09/2020  . OSA treated with BiPAP 12/14/2014  . Pain syndrome, chronic 07/09/2012  . Pernicious anemia 02/06/2017  . Polyneuropathy in diabetes ([place]) 02/24/2013  . Syndrome affecting cervical region 02/24/2013  . Trigeminal neuralgia 02/24/2013  . Type 2 diabetes mellitus with diabetic neuropathy ([place]) 12/14/2014  . Vitamin D deficiency 02/16/2020  . Wound dehiscence, traumatic injury repair, left medial ankle  03/08/2020    Past Surgical History:  Procedure Laterality Date  . ADENOIDECTOMY    . BACK SURGERY  2002 /2003  . CARPAL TUNNEL RELEASE Bilateral   . I & D EXTREMITY Left 01/31/2020   Procedure: IRRIGATION AND DEBRIDEMENT ANKLE;  Surgeon: Altamese [place], MD;  Location: [facility];  Service: Orthopedics;  Laterality: Left;  . I & D EXTREMITY Left 03/10/2020   Procedure: DEBRIDEMENT LEFT ANKLE, APPLY SKIN GRAFT;  Surgeon: [name], MD;  Location: [facility];  Service: Orthopedics;  Laterality: Left;  . KNEE SURGERY Left 2005  . ORIF ANKLE FRACTURE  Left 01/31/2020  . ORIF ANKLE FRACTURE Left 01/31/2020   Procedure: OPEN REDUCTION INTERNAL FIXATION (ORIF) ANKLE FRACTURE;  Surgeon: Altamese Morrison, MD;  Location: [facility];  Service: Orthopedics;  Laterality: Left;  . SHOULDER SURGERY Left 1998  . TONSILLECTOMY      Current Medications: Current Meds  Medication Sig  . ascorbic acid (VITAMIN C) 1000 MG tablet Take 1 tablet (1,000 mg total) by  mouth daily.  . Azelastine-Fluticasone 137-50 MCG/ACT SUSP Place 2 sprays into both nostrils 2 (two) times daily.  . Cholecalciferol (VITAMIN D) 125 MCG (5000 UT) CAPS Take 1 capsule by mouth daily. (Patient taking differently: Take 5,000 Units by mouth daily.)  . dicyclomine (BENTYL) 20 MG tablet Take 20 mg by mouth 2 (two) times daily.  Marland Kitchen docusate sodium (COLACE) 100 MG capsule Take 1 capsule (100 mg total) by mouth 2 (two) times daily.  . DULoxetine (CYMBALTA) 60 MG capsule Take 60 mg by mouth daily.   Marland Kitchen ELIQUIS 5 MG TABS tablet TAKE 1 TABLET BY MOUTH TWICE DAILY (Patient taking differently: Take 5 mg by mouth 2 (two) times daily.)  . EPINEPHrine 0.3 mg/0.3 mL IJ SOAJ injection Use for life-threatening allergic reactions (Patient taking differently: Inject 0.3 mg into the muscle as needed (for life-threatening allergic reactions).)  . furosemide (LASIX) 40 MG tablet Take 40 mg by mouth daily as needed for fluid or edema.  . gabapentin (NEURONTIN) 300 MG capsule Take 600 mg by mouth 3 (three) times daily.   . metFORMIN (GLUCOPHAGE) 500 MG tablet Take 500 mg by mouth daily with breakfast.  . metoprolol tartrate (LOPRESSOR) 25 MG tablet Take 1 tablet (25 mg total) by mouth at bedtime.  . metoprolol tartrate (LOPRESSOR) 50 MG tablet Take 1 tablet (50 mg total) by mouth daily. (Patient taking differently: Take 50 mg by mouth in the morning.)  . naloxone (NARCAN) nasal spray 4 mg/0.1 mL Place 1 spray into the nose See admin instructions. USE 1 SPRAY NASALLY AS NEEDED FOR OPIOID OVERDOSE EMERGENCY  . naproxen sodium (ALEVE) 220 MG tablet Take 440 mg by mouth daily as needed (for headaches).  . nitroGLYCERIN (NITROSTAT) 0.4 MG SL tablet Place 0.4 mg under the tongue every 5 (five) minutes as needed for chest pain.  Marland Kitchen ondansetron (ZOFRAN-ODT) 4 MG disintegrating tablet Take 4 mg by mouth every 8 (eight) hours as needed for nausea or vomiting.  Marland Kitchen oxyCODONE-acetaminophen (PERCOCET) 7.5-325 MG tablet Take 1  tablet by mouth every 8 (eight) hours as needed for severe pain.  . pantoprazole (PROTONIX) 40 MG tablet Take 40 mg by mouth daily.  . simvastatin (ZOCOR) 20 MG tablet Take 20 mg by mouth at bedtime.  . tamsulosin (FLOMAX) 0.4 MG CAPS capsule Take 0.4 mg by mouth at bedtime.  . traMADol (ULTRAM) 50 MG tablet Take 1 tablet (50 mg total) by mouth every 6 (six) hours as needed for moderate pain or severe pain.  . vitamin B-12 1000 MCG tablet Take 1 tablet (1,000 mcg total) by mouth daily.     Allergies:   Penicillins, Aspirin, and Bee venom   Social History   Socioeconomic History  . Marital status: Married    Spouse name: Not on file  . Number of children: Not on file  . Years of education: Not on file  . Highest education level: Not on file  Occupational History  . Not on file  Tobacco Use  . Smoking status: Former Smoker    Packs/day: 3.00    Years: 35.00  Pack years: 105.00    Types: Cigarettes    Quit date: 04/13/2006    Years since quitting: 13.9  . Smokeless tobacco: Never Used  Vaping Use  . Vaping Use: Never used  Substance and Sexual Activity  . Alcohol use: No    Alcohol/week: 0.0 standard drinks  . Drug use: No  . Sexual activity: Not on file  Other Topics Concern  . Not on file  Social History Narrative  . Not on file   Social Determinants of Health   Financial Resource Strain: Not on file  Food Insecurity: Not on file  Transportation Needs: Not on file  Physical Activity: Not on file  Stress: Not on file  Social Connections: Not on file     Family History: The patient's family history includes Atrial fibrillation in his father; Breast cancer in his maternal grandmother and mother; CAD in his father; Heart disease in his father and paternal uncle; Stomach cancer in his paternal aunt. ROS:   Please see the history of present illness.    All other systems reviewed and are negative.  EKGs/Labs/Other Studies Reviewed:    The following studies were  reviewed today:    Recent Labs: 09/14/2019 cholesterol 151 LDL 78 triglycerides 241 HDL 25 A1c 6.0% 02/08/2020: Magnesium 2.1 03/08/2020: Hemoglobin 12.7; Platelets 460 03/10/2020: ALT 19; BUN 9; Creatinine, Ser 1.10; Potassium 3.8; Sodium 138  Recent Lipid Panel    Component Value Date/Time   CHOL 156 12/03/2018 1502   TRIG 301 (H) 12/03/2018 1502   HDL 29 (L) 12/03/2018 1502   CHOLHDL 5.4 (H) 12/03/2018 1502   LDLCALC 78 12/03/2018 1502    Physical Exam:    VS:  BP 139/77   Pulse (!) 110   Ht 6\' 2"  (1.88 m)   Wt (!) 365 lb (165.6 kg) Comment: PER PATIENT 4 WKS AGO / NOT ABLE TO WEIGH TODAY  SpO2 94%   BMI 46.86 kg/m     Wt Readings from Last 3 Encounters:  03/30/20 (!) 365 lb (165.6 kg)  03/10/20 (!) 358 lb 14.5 oz (162.8 kg)  01/31/20 (!) 374 lb 12.5 oz (170 kg)     GEN:  Well nourished, well developed in no acute distress HEENT: Normal NECK: No JVD; No carotid bruits LYMPHATICS: No lymphadenopathy CARDIAC: Irregular variable first heart sound  no murmurs, rubs, gallops RESPIRATORY:  Clear to auscultation without rales, wheezing or rhonchi  ABDOMEN: Soft, non-tender, non-distended MUSCULOSKELETAL:  No edema; No deformity  SKIN: Warm and dry NEUROLOGIC:  Alert and oriented x 3 PSYCHIATRIC:  Normal affect    Signed, David More, MD  03/30/2020 1:36 PM    [facility]

## 2020-03-30 ENCOUNTER — Telehealth: Payer: Self-pay

## 2020-03-30 ENCOUNTER — Ambulatory Visit (INDEPENDENT_AMBULATORY_CARE_PROVIDER_SITE_OTHER): Payer: Medicare HMO | Admitting: Orthopedic Surgery

## 2020-03-30 ENCOUNTER — Other Ambulatory Visit: Payer: Self-pay | Admitting: Physician Assistant

## 2020-03-30 ENCOUNTER — Other Ambulatory Visit: Payer: Self-pay

## 2020-03-30 ENCOUNTER — Encounter: Payer: Self-pay | Admitting: Cardiology

## 2020-03-30 ENCOUNTER — Ambulatory Visit: Payer: Medicare HMO | Admitting: Cardiology

## 2020-03-30 VITALS — BP 139/77 | HR 110 | Ht 74.0 in | Wt 365.0 lb

## 2020-03-30 DIAGNOSIS — I4819 Other persistent atrial fibrillation: Secondary | ICD-10-CM | POA: Diagnosis not present

## 2020-03-30 DIAGNOSIS — I952 Hypotension due to drugs: Secondary | ICD-10-CM

## 2020-03-30 DIAGNOSIS — I119 Hypertensive heart disease without heart failure: Secondary | ICD-10-CM

## 2020-03-30 DIAGNOSIS — Z7901 Long term (current) use of anticoagulants: Secondary | ICD-10-CM

## 2020-03-30 DIAGNOSIS — S82892B Other fracture of left lower leg, initial encounter for open fracture type I or II: Secondary | ICD-10-CM

## 2020-03-30 MED ORDER — SULFAMETHOXAZOLE-TRIMETHOPRIM 800-160 MG PO TABS: 1.0000 | ORAL_TABLET | Freq: Two times a day (BID) | ORAL | 0 refills | Status: DC

## 2020-03-30 NOTE — Patient Instructions (Signed)
Medication Instructions:  Your physician has recommended you make the following change in your medication:  STOP: Tamsulosin  *If you need a refill on your cardiac medications before your next appointment, please call your pharmacy*   Lab Work: None If you have labs (blood work) drawn today and your tests are completely normal, you will receive your results only by: Marland Kitchen MyChart Message (if you have MyChart) OR . A paper copy in the mail If you have any lab test that is abnormal or we need to change your treatment, we will call you to review the results.   Testing/Procedures: None   Follow-Up: At [facility], you and your health needs are our priority.  As part of our continuing mission to provide you with exceptional heart care, we have created designated Provider Care Teams.  These Care Teams include your primary Cardiologist (physician) and Advanced Practice Providers (APPs -  Physician Assistants and Nurse Practitioners) who all work together to provide you with the care you need, when you need it.  We recommend signing up for the patient portal called "MyChart".  Sign up information is provided on this After Visit Summary.  MyChart is used to connect with patients for Virtual Visits (Telemedicine).  Patients are able to view lab/test results, encounter notes, upcoming appointments, etc.  Non-urgent messages can be sent to your provider as well.   To learn more about what you can do with MyChart, go to [email or web address].    Your next appointment:   6 month(s)  The format for your next appointment:   In Person  Provider:   Shirlee More, MD   Other Instructions

## 2020-03-30 NOTE — Telephone Encounter (Signed)
Done

## 2020-03-30 NOTE — Telephone Encounter (Signed)
Written on snap shot from appt this morning Dr. Sharol Given was going to give rx for Bactrim DS for this pt can you please sent in?

## 2020-03-30 NOTE — Telephone Encounter (Signed)
Called sw pt's wife to advise that rx has been sent to pharm.

## 2020-03-30 NOTE — Telephone Encounter (Signed)
Patient called regarding a rx for antibiotics that was supposed to be sent into the pharmacy patient stated the rx hasn't been sent call back:[phone] or wife's phone:[phone]

## 2020-03-31 ENCOUNTER — Encounter: Payer: Self-pay | Admitting: Orthopedic Surgery

## 2020-03-31 ENCOUNTER — Ambulatory Visit: Payer: Medicare HMO

## 2020-03-31 DIAGNOSIS — S82892B Other fracture of left lower leg, initial encounter for open fracture type I or II: Secondary | ICD-10-CM

## 2020-03-31 NOTE — Progress Notes (Signed)
Office Visit Note   Patient: David Fitzgerald           Date of Birth: Nov 03, 1960           MRN: [id number] Visit Date: 03/30/2020              Requested by: Angelina Sheriff, MD [address] [place],  [place] [address] PCP: Angelina Sheriff, MD  Chief Complaint  Patient presents with  . Left Ankle - Routine Post Op    03/10/20 left ankle debridement  Kerecis graft       HPI: Patient is a 60 year old gentleman who is 3 weeks status post debridement of open ankle fracture and placement of Kerecis skin graft.  Currently with a silver cell dressing and a compression wrap.  Assessment & Plan: Visit Diagnoses:  1. Open fracture dislocation of left ankle     Plan: We will follow-up Monday with new compression wrap.  Will call in a prescription for Bactrim DS.  Follow-Up Instructions: Return in about 1 week (around 04/06/2020).   Ortho Exam  Patient is alert, oriented, no adenopathy, well-dressed, normal affect, normal respiratory effort. Examination the wound is debrided there is improved granulation tissue with good petechial bleeding patient has completed his Cipro we will call in a prescription for Bactrim DS we will apply a new compression wrap discussed that if this bleed through the compression wrap we would need to see him tomorrow and then follow-up on Monday.  Imaging: No results found.   Labs: Lab Results  Component Value Date   HGBA1C 6.0 (H) 03/08/2020   HGBA1C 6.0 (H) 02/04/2020   REPTSTATUS 03/15/2020 FINAL 03/10/2020   GRAMSTAIN  03/10/2020    MODERATE WBC PRESENT, PREDOMINANTLY PMN NO ORGANISMS SEEN    CULT  03/10/2020    RARE ENTEROBACTER CLOACAE CRITICAL RESULT CALLED TO, READ BACK BY AND VERIFIED WITH: RN TGilford Rile [phone] FCP NO ANAEROBES ISOLATED Performed at [facility], [place] [address], [place],  [address]    [place] 03/10/2020     Lab Results  Component Value Date   ALBUMIN 2.7 (L) 03/10/2020    ALBUMIN 2.9 (L) 03/08/2020   ALBUMIN 2.9 (L) 02/16/2020    Lab Results  Component Value Date   MG 2.1 02/08/2020   MG 2.0 02/04/2020   MG 1.9 02/03/2020   Lab Results  Component Value Date   VD25OH 19.57 (L) 03/08/2020   VD25OH 16.36 (L) 02/16/2020    No results found for: PREALBUMIN CBC EXTENDED Latest Ref Rng & Units 03/08/2020 02/16/2020 02/10/2020  WBC 4.0 - 10.5 K/uL 10.6(H) 8.4 10.4  RBC 4.22 - 5.81 MIL/uL 4.50 4.16(L) 4.13(L)  HGB 13.0 - 17.0 g/dL 12.7(L) 12.4(L) 12.3(L)  HCT 39.0 - 52.0 % 42.0 38.6(L) 38.7(L)  PLT 150 - 400 K/uL 460(H) 302 322  NEUTROABS 1.7 - 7.7 K/uL 7.7 - -  LYMPHSABS 0.7 - 4.0 K/uL 1.5 - -     There is no height or weight on file to calculate BMI.  Orders:  No orders of the defined types were placed in this encounter.  No orders of the defined types were placed in this encounter.    Procedures: No procedures performed  Clinical Data: No additional findings.  ROS:  All other systems negative, except as noted in the HPI. Review of Systems  Objective: Vital Signs: There were no vitals taken for this visit.  Specialty Comments:  No specialty comments available.  PMFS History: Patient Active Problem List   Diagnosis Date Noted  . A-fib ([place])   . OSA (obstructive sleep apnea) 03/09/2020  . Wound dehiscence, traumatic injury repair, left medial ankle  03/08/2020  . Vitamin D deficiency 02/16/2020  . Chronic, continuous use of opioids 02/15/2020  . Open fracture dislocation of left ankle 01/31/2020  . Ankle fracture 01/31/2020  . Permanent atrial fibrillation ([place])   . Hypertension   . Diabetes ([place])   . Bee sting allergy   . Hypotension 12/25/2017  . Chronic anticoagulation 03/07/2017  . Coronary artery calcification seen on CT scan 02/07/2017  . CAD in native artery 02/07/2017  . Persistent atrial fibrillation ([place]) 02/06/2017  . Anxiety 02/06/2017  . Arthritis 02/06/2017  . Pernicious anemia 02/06/2017  . Allergy to pollen  04/06/2015  . OSA treated with BiPAP 12/14/2014  . Obesity hypoventilation syndrome ([place]) 12/14/2014  . Morbid obesity with BMI of 40.0-44.9, adult ([place]) 12/14/2014  . Hypertensive heart disease 12/14/2014  . Hyperlipidemia, mixed 12/14/2014  . Type 2 diabetes mellitus with diabetic neuropathy ([place]) 12/14/2014  . Chronic venous insufficiency 12/14/2014  . Edema 12/14/2014  . Morbid obesity ([place]) 12/14/2014  . Bell's palsy 02/24/2013  . Polyneuropathy in diabetes ([place]) 02/24/2013  . Syndrome affecting cervical region 02/24/2013  . Trigeminal neuralgia 02/24/2013  . Lumbar pain with radiation down left leg 07/09/2012  . BMI 45.0-49.9, adult ([place]) 07/09/2012  . Chronic pain syndrome 07/09/2012  . Morbid obesity with BMI of 45.0-49.9, adult ([place]) 07/09/2012  . Pain syndrome, chronic 07/09/2012   Past Medical History:  Diagnosis Date  . A-fib ([place])   . Allergy to pollen 04/06/2015  . Anxiety 02/06/2017  . Arthritis 02/06/2017  . Bee sting allergy    wasp / mixed vespid  . Bell's palsy 02/24/2013  . Chronic venous insufficiency 12/14/2014  . Chronic, continuous use of opioids 02/15/2020  . Diabetes ([place])   . Edema 12/14/2014  . Hyperlipidemia, mixed 12/14/2014  . Hypertension   . Lumbar pain with radiation down left leg 07/09/2012  . Morbid obesity ([place]) 12/14/2014  . Morbid obesity with BMI of 45.0-49.9, adult ([employer]) 07/09/2012  . Obesity hypoventilation syndrome ([place]) 12/14/2014  . OSA (obstructive sleep apnea) 03/09/2020  . OSA treated with BiPAP 12/14/2014  . Pain syndrome, chronic 07/09/2012  . Pernicious anemia 02/06/2017  . Polyneuropathy in diabetes ([place]) 02/24/2013  . Syndrome affecting cervical region 02/24/2013  . Trigeminal neuralgia 02/24/2013  . Type 2 diabetes mellitus with diabetic neuropathy ([place]) 12/14/2014  . Vitamin D deficiency 02/16/2020  . Wound dehiscence, traumatic injury repair, left medial ankle  03/08/2020    Family History  Problem Relation Age of Onset  . Breast cancer  Mother   . Heart disease Father   . CAD Father   . Atrial fibrillation Father   . Stomach cancer Paternal Aunt   . Heart disease Paternal Uncle   . Breast cancer Maternal Grandmother     Past Surgical History:  Procedure Laterality Date  . ADENOIDECTOMY    . BACK SURGERY  2002 /2003  . CARPAL TUNNEL RELEASE Bilateral   . I & D EXTREMITY Left 01/31/2020   Procedure: IRRIGATION AND DEBRIDEMENT ANKLE;  Surgeon: Altamese [place], MD;  Location: [facility];  Service: Orthopedics;  Laterality: Left;  . I & D EXTREMITY Left 03/10/2020   Procedure: DEBRIDEMENT LEFT ANKLE, APPLY SKIN GRAFT;  Surgeon: [name], MD;  Location: [facility];  Service: Orthopedics;  Laterality: Left;  . KNEE SURGERY  Left 2005  . ORIF ANKLE FRACTURE Left 01/31/2020  . ORIF ANKLE FRACTURE Left 01/31/2020   Procedure: OPEN REDUCTION INTERNAL FIXATION (ORIF) ANKLE FRACTURE;  Surgeon: Altamese Nash, MD;  Location: [facility];  Service: Orthopedics;  Laterality: Left;  . SHOULDER SURGERY Left 1998  . TONSILLECTOMY     Social History   Occupational History  . Not on file  Tobacco Use  . Smoking status: Former Smoker    Packs/day: 3.00    Years: 35.00    Pack years: 105.00    Types: Cigarettes    Quit date: 04/13/2006    Years since quitting: 13.9  . Smokeless tobacco: Never Used  Vaping Use  . Vaping Use: Never used  Substance and Sexual Activity  . Alcohol use: No    Alcohol/week: 0.0 standard drinks  . Drug use: No  . Sexual activity: Not on file

## 2020-03-31 NOTE — Progress Notes (Signed)
Patient is 3 weeks status post left ankle debridement and application of Kerecis skin graft. Patient was in the office yesterday  For a compression dressing change and states that when he arrived home drainage was coming through the dressing. There is a moderate amount of serosanguinous drainage on the dressing.   Removed and reapplied Silvercell to graft, 4x4 with ABD and then the 4 layer profore compression dressing. Advised pt to continue with non weight bearing in fx boot, elevation higher than heart and to keep appointment already scheduled for Monday next week. Patient voiced understanding and will call with any questions.   Laiya Wisby, [place], [employer]

## 2020-04-02 DIAGNOSIS — Z48817 Encounter for surgical aftercare following surgery on the skin and subcutaneous tissue: Secondary | ICD-10-CM | POA: Diagnosis not present

## 2020-04-02 DIAGNOSIS — S9305XD Dislocation of left ankle joint, subsequent encounter: Secondary | ICD-10-CM | POA: Diagnosis not present

## 2020-04-03 ENCOUNTER — Ambulatory Visit (INDEPENDENT_AMBULATORY_CARE_PROVIDER_SITE_OTHER): Payer: Medicare HMO | Admitting: Orthopedic Surgery

## 2020-04-03 ENCOUNTER — Other Ambulatory Visit: Payer: Self-pay | Admitting: Physician Assistant

## 2020-04-03 ENCOUNTER — Other Ambulatory Visit: Payer: Self-pay

## 2020-04-03 ENCOUNTER — Encounter: Payer: Self-pay | Admitting: Orthopedic Surgery

## 2020-04-03 ENCOUNTER — Ambulatory Visit (INDEPENDENT_AMBULATORY_CARE_PROVIDER_SITE_OTHER): Payer: Medicare HMO

## 2020-04-03 DIAGNOSIS — S82892B Other fracture of left lower leg, initial encounter for open fracture type I or II: Secondary | ICD-10-CM

## 2020-04-03 DIAGNOSIS — T85848A Pain due to other internal prosthetic devices, implants and grafts, initial encounter: Secondary | ICD-10-CM

## 2020-04-03 NOTE — Progress Notes (Signed)
Office Visit Note   Patient: David Fitzgerald           Date of Birth: 05-10-1960           MRN: [id number] Visit Date: 04/03/2020              Requested by: Angelina Sheriff, MD [address] [place],  [place] [address] PCP: Angelina Sheriff, MD  Chief Complaint  Patient presents with  . Left Ankle - Routine Post Op    03/10/20 debridement left ankle Kerecis graft       HPI: Patient is a 60 year old gentleman who is seen in follow-up status post skin graft for open wound medial malleolus left ankle patient initially underwent open reduction internal fixation for a open fracture dislocation of the ankle with internal fixation of the syndesmosis and internal fixation of the Weber C fibular fracture.  Patient states he has increased deformity to the ankle and increased drainage from the skin graft.  Patient states that he has been nonweightbearing on the left foot.  Assessment & Plan: Visit Diagnoses:  1. Open fracture dislocation of left ankle   2. Pain from implanted hardware, initial encounter     Plan: With the hardware failure and dislocation of the tibiotalar joint with the large open wound medially will plan for internal fixation with reduction and placement of calcaneal pins from the calcaneus talus and tibial joint.  After wound healing patient would need to proceed with a complete fusion.  Do not feel the soft tissue envelope is stay for or stable at this time to proceed with a formalized fusion patient would be an increased risk for infection.  Plan for surgery on Wednesday as an outpatient discussed the importance of nonweightbearing to be continued.  Follow-Up Instructions: Return in about 1 week (around 04/10/2020).   Ortho Exam  Patient is alert, oriented, no adenopathy, well-dressed, normal affect, normal respiratory effort. Examination patient has bleeding granulation tissue over the medial malleolar ulcer this was touched with silver nitrate he is on Eliquis for  his atrial fibrillation.  Patient has progressive valgus deformity of the ankle and radiographs shows a tibiotalar dislocation with failure of the hardware.  A Dynaflex wrap with silver cell is reapplied to help decrease the swelling and promote wound healing.  Patient was placed back in his fracture boot.  Imaging: XR Ankle Complete Left  Result Date: 04/03/2020 Three-view radiographs of the left ankle shows hardware failure of the fibular fracture and syndesmotic internal fixation.  There is dislocation of the ankle tibiotalar joint  No images are attached to the encounter.  Labs: Lab Results  Component Value Date   HGBA1C 6.0 (H) 03/08/2020   HGBA1C 6.0 (H) 02/04/2020   REPTSTATUS 03/15/2020 FINAL 03/10/2020   GRAMSTAIN  03/10/2020    MODERATE WBC PRESENT, PREDOMINANTLY PMN NO ORGANISMS SEEN    CULT  03/10/2020    RARE ENTEROBACTER CLOACAE CRITICAL RESULT CALLED TO, READ BACK BY AND VERIFIED WITH: RN TGilford Rile [phone] FCP NO ANAEROBES ISOLATED Performed at [facility], [place] [address], [place], [place] [address]    [place] CLOACAE 03/10/2020     Lab Results  Component Value Date   ALBUMIN 2.7 (L) 03/10/2020   ALBUMIN 2.9 (L) 03/08/2020   ALBUMIN 2.9 (L) 02/16/2020    Lab Results  Component Value Date   MG 2.1 02/08/2020   MG 2.0 02/04/2020   MG 1.9 02/03/2020   Lab Results  Component Value Date   VD25OH 19.57 (L) 03/08/2020   VD25OH 16.36 (L) 02/16/2020    No results found for: PREALBUMIN CBC EXTENDED Latest Ref Rng & Units 03/08/2020 02/16/2020 02/10/2020  WBC 4.0 - 10.5 K/uL 10.6(H) 8.4 10.4  RBC 4.22 - 5.81 MIL/uL 4.50 4.16(L) 4.13(L)  HGB 13.0 - 17.0 g/dL 12.7(L) 12.4(L) 12.3(L)  HCT 39.0 - 52.0 % 42.0 38.6(L) 38.7(L)  PLT 150 - 400 K/uL 460(H) 302 322  NEUTROABS 1.7 - 7.7 K/uL 7.7 - -  LYMPHSABS 0.7 - 4.0 K/uL 1.5 - -     There is no height or weight on file to calculate BMI.  Orders:  Orders Placed This Encounter  Procedures   . XR Ankle Complete Left   No orders of the defined types were placed in this encounter.    Procedures: No procedures performed  Clinical Data: No additional findings.  ROS:  All other systems negative, except as noted in the HPI. Review of Systems  Objective: Vital Signs: There were no vitals taken for this visit.  Specialty Comments:  No specialty comments available.  PMFS History: Patient Active Problem List   Diagnosis Date Noted  . A-fib ([place])   . OSA (obstructive sleep apnea) 03/09/2020  . Wound dehiscence, traumatic injury repair, left medial ankle  03/08/2020  . Vitamin D deficiency 02/16/2020  . Chronic, continuous use of opioids 02/15/2020  . Open fracture dislocation of left ankle 01/31/2020  . Ankle fracture 01/31/2020  . Permanent atrial fibrillation ([place])   . Hypertension   . Diabetes ([place])   . Bee sting allergy   . Hypotension 12/25/2017  . Chronic anticoagulation 03/07/2017  . Coronary artery calcification seen on CT scan 02/07/2017  . CAD in native artery 02/07/2017  . Persistent atrial fibrillation ([place]) 02/06/2017  . Anxiety 02/06/2017  . Arthritis 02/06/2017  . Pernicious anemia 02/06/2017  . Allergy to pollen 04/06/2015  . OSA treated with BiPAP 12/14/2014  . Obesity hypoventilation syndrome ([place]) 12/14/2014  . Morbid obesity with BMI of 40.0-44.9, adult ([place]) 12/14/2014  . Hypertensive heart disease 12/14/2014  . Hyperlipidemia, mixed 12/14/2014  . Type 2 diabetes mellitus with diabetic neuropathy ([place]) 12/14/2014  . Chronic venous insufficiency 12/14/2014  . Edema 12/14/2014  . Morbid obesity ([place]) 12/14/2014  . Bell's palsy 02/24/2013  . Polyneuropathy in diabetes ([place]) 02/24/2013  . Syndrome affecting cervical region 02/24/2013  . Trigeminal neuralgia 02/24/2013  . Lumbar pain with radiation down left leg 07/09/2012  . BMI 45.0-49.9, adult ([place]) 07/09/2012  . Chronic pain syndrome 07/09/2012  . Morbid obesity with BMI of  45.0-49.9, adult ([place]) 07/09/2012  . Pain syndrome, chronic 07/09/2012   Past Medical History:  Diagnosis Date  . A-fib ([place])   . Allergy to pollen 04/06/2015  . Anxiety 02/06/2017  . Arthritis 02/06/2017  . Bee sting allergy    wasp / mixed vespid  . Bell's palsy 02/24/2013  . Chronic venous insufficiency 12/14/2014  . Chronic, continuous use of opioids 02/15/2020  . Diabetes ([place])   . Edema 12/14/2014  . Hyperlipidemia, mixed 12/14/2014  . Hypertension   . Lumbar pain with radiation down left leg 07/09/2012  . Morbid obesity ([place]) 12/14/2014  . Morbid obesity with BMI of 45.0-49.9, adult ([place]) 07/09/2012  . Obesity hypoventilation syndrome ([place]) 12/14/2014  . OSA (obstructive sleep apnea) 03/09/2020  . OSA treated with BiPAP 12/14/2014  . Pain syndrome, chronic 07/09/2012  . Pernicious anemia 02/06/2017  . Polyneuropathy in diabetes ([place]) 02/24/2013  . Syndrome  affecting cervical region 02/24/2013  . Trigeminal neuralgia 02/24/2013  . Type 2 diabetes mellitus with diabetic neuropathy ([place]) 12/14/2014  . Vitamin D deficiency 02/16/2020  . Wound dehiscence, traumatic injury repair, left medial ankle  03/08/2020    Family History  Problem Relation Age of Onset  . Breast cancer Mother   . Heart disease Father   . CAD Father   . Atrial fibrillation Father   . Stomach cancer Paternal Aunt   . Heart disease Paternal Uncle   . Breast cancer Maternal Grandmother     Past Surgical History:  Procedure Laterality Date  . ADENOIDECTOMY    . BACK SURGERY  2002 /2003  . CARPAL TUNNEL RELEASE Bilateral   . I & D EXTREMITY Left 01/31/2020   Procedure: IRRIGATION AND DEBRIDEMENT ANKLE;  Surgeon: Altamese [place]arillo, MD;  Location: [facility];  Service: Orthopedics;  Laterality: Left;  . I & D EXTREMITY Left 03/10/2020   Procedure: DEBRIDEMENT LEFT ANKLE, APPLY SKIN GRAFT;  Surgeon: [name], MD;  Location: [facility];  Service: Orthopedics;  Laterality: Left;  . KNEE SURGERY Left 2005  . ORIF ANKLE FRACTURE Left  01/31/2020  . ORIF ANKLE FRACTURE Left 01/31/2020   Procedure: OPEN REDUCTION INTERNAL FIXATION (ORIF) ANKLE FRACTURE;  Surgeon: Altamese [place]and[place], MD;  Location: [facility];  Service: Orthopedics;  Laterality: Left;  . SHOULDER SURGERY Left 1998  . TONSILLECTOMY     Social History   Occupational History  . Not on file  Tobacco Use  . Smoking status: Former Smoker    Packs/day: 3.00    Years: 35.00    Pack years: 105.00    Types: Cigarettes    Quit date: 04/13/2006    Years since quitting: 13.9  . Smokeless tobacco: Never Used  Vaping Use  . Vaping Use: Never used  Substance and Sexual Activity  . Alcohol use: No    Alcohol/week: 0.0 standard drinks  . Drug use: No  . Sexual activity: Not on file

## 2020-04-04 ENCOUNTER — Other Ambulatory Visit (HOSPITAL_COMMUNITY)
Admission: RE | Admit: 2020-04-04 | Discharge: 2020-04-04 | Disposition: A | Discharge status: Home / Self Care | Payer: Medicare HMO | Source: Ambulatory Visit | Attending: Orthopedic Surgery | Admitting: Orthopedic Surgery

## 2020-04-04 ENCOUNTER — Encounter: Payer: Self-pay | Admitting: Orthopedic Surgery

## 2020-04-04 ENCOUNTER — Encounter (HOSPITAL_COMMUNITY): Payer: Self-pay | Admitting: Orthopedic Surgery

## 2020-04-04 DIAGNOSIS — Z79891 Long term (current) use of opiate analgesic: Secondary | ICD-10-CM | POA: Diagnosis not present

## 2020-04-04 DIAGNOSIS — G8918 Other acute postprocedural pain: Secondary | ICD-10-CM | POA: Diagnosis not present

## 2020-04-04 DIAGNOSIS — E1142 Type 2 diabetes mellitus with diabetic polyneuropathy: Secondary | ICD-10-CM | POA: Diagnosis present

## 2020-04-04 DIAGNOSIS — Z7984 Long term (current) use of oral hypoglycemic drugs: Secondary | ICD-10-CM | POA: Diagnosis not present

## 2020-04-04 DIAGNOSIS — Z7901 Long term (current) use of anticoagulants: Secondary | ICD-10-CM | POA: Diagnosis not present

## 2020-04-04 DIAGNOSIS — Z01812 Encounter for preprocedural laboratory examination: Secondary | ICD-10-CM | POA: Insufficient documentation

## 2020-04-04 DIAGNOSIS — T8489XA Other specified complication of internal orthopedic prosthetic devices, implants and grafts, initial encounter: Secondary | ICD-10-CM | POA: Diagnosis not present

## 2020-04-04 DIAGNOSIS — Z91030 Bee allergy status: Secondary | ICD-10-CM | POA: Diagnosis not present

## 2020-04-04 DIAGNOSIS — S9305XS Dislocation of left ankle joint, sequela: Secondary | ICD-10-CM | POA: Diagnosis not present

## 2020-04-04 DIAGNOSIS — Z880 Allergy status to penicillin: Secondary | ICD-10-CM | POA: Diagnosis not present

## 2020-04-04 DIAGNOSIS — I4819 Other persistent atrial fibrillation: Secondary | ICD-10-CM | POA: Diagnosis not present

## 2020-04-04 DIAGNOSIS — Z79899 Other long term (current) drug therapy: Secondary | ICD-10-CM | POA: Diagnosis not present

## 2020-04-04 DIAGNOSIS — Z886 Allergy status to analgesic agent status: Secondary | ICD-10-CM | POA: Diagnosis not present

## 2020-04-04 DIAGNOSIS — Z9989 Dependence on other enabling machines and devices: Secondary | ICD-10-CM | POA: Diagnosis not present

## 2020-04-04 DIAGNOSIS — Z885 Allergy status to narcotic agent status: Secondary | ICD-10-CM | POA: Diagnosis not present

## 2020-04-04 DIAGNOSIS — S91002D Unspecified open wound, left ankle, subsequent encounter: Secondary | ICD-10-CM | POA: Diagnosis not present

## 2020-04-04 DIAGNOSIS — Z87891 Personal history of nicotine dependence: Secondary | ICD-10-CM | POA: Diagnosis not present

## 2020-04-04 DIAGNOSIS — Z20822 Contact with and (suspected) exposure to covid-19: Secondary | ICD-10-CM | POA: Diagnosis not present

## 2020-04-04 DIAGNOSIS — K219 Gastro-esophageal reflux disease without esophagitis: Secondary | ICD-10-CM | POA: Diagnosis present

## 2020-04-04 DIAGNOSIS — Z6841 Body Mass Index (BMI) 40.0 and over, adult: Secondary | ICD-10-CM | POA: Diagnosis not present

## 2020-04-04 DIAGNOSIS — S91002S Unspecified open wound, left ankle, sequela: Secondary | ICD-10-CM | POA: Diagnosis not present

## 2020-04-04 DIAGNOSIS — L97329 Non-pressure chronic ulcer of left ankle with unspecified severity: Secondary | ICD-10-CM | POA: Diagnosis not present

## 2020-04-04 DIAGNOSIS — Y831 Surgical operation with implant of artificial internal device as the cause of abnormal reaction of the patient, or of later complication, without mention of misadventure at the time of the procedure: Secondary | ICD-10-CM | POA: Diagnosis present

## 2020-04-04 DIAGNOSIS — I4891 Unspecified atrial fibrillation: Secondary | ICD-10-CM | POA: Diagnosis present

## 2020-04-04 DIAGNOSIS — T84117A Breakdown (mechanical) of internal fixation device of bone of left lower leg, initial encounter: Secondary | ICD-10-CM | POA: Diagnosis not present

## 2020-04-04 DIAGNOSIS — G4733 Obstructive sleep apnea (adult) (pediatric): Secondary | ICD-10-CM | POA: Diagnosis not present

## 2020-04-04 DIAGNOSIS — Z8249 Family history of ischemic heart disease and other diseases of the circulatory system: Secondary | ICD-10-CM | POA: Diagnosis not present

## 2020-04-04 DIAGNOSIS — G500 Trigeminal neuralgia: Secondary | ICD-10-CM | POA: Diagnosis present

## 2020-04-04 DIAGNOSIS — E662 Morbid (severe) obesity with alveolar hypoventilation: Secondary | ICD-10-CM | POA: Diagnosis not present

## 2020-04-04 DIAGNOSIS — I10 Essential (primary) hypertension: Secondary | ICD-10-CM | POA: Diagnosis present

## 2020-04-04 DIAGNOSIS — E782 Mixed hyperlipidemia: Secondary | ICD-10-CM | POA: Diagnosis present

## 2020-04-04 NOTE — Progress Notes (Signed)
Office Visit Note   Patient: David Fitzgerald           Date of Birth: 03-17-1960           MRN: [id number] Visit Date: 03/28/2020              Requested by: Angelina Sheriff, MD [address] [place],  [place] [address] PCP: Angelina Sheriff, MD  Chief Complaint  Patient presents with  . Left Ankle - Routine Post Op    03/10/20 left ankle Deb Kerecis  skin graft       HPI:  Patient is a 60 year old gentleman who presents for follow-up status postKerecis skin graft to the open left ankle fracture dislocation wound.  Patient complains of swelling and bloody drainage.  He has been on Cipro has 2 days remaining.  Assessment & Plan: Visit Diagnoses:  1. Open fracture dislocation of left ankle     Plan: We will follow-up twice a week for compression dressing changes.  Silver cell to the wound bed plus a Dynaflex compression wrap.  Follow-Up Instructions: Return in about 1 week (around 04/04/2020).   Ortho Exam  Patient is alert, oriented, no adenopathy, well-dressed, normal affect, normal respiratory effort. Examination there is fibrinous tissue covering the skin graft.  A 4 x 4 gauze was used to debride the fibrinous tissue there was healthy granulation tissue at the base of the wound.  Silver nitrate and compression was used for hemostasis plan to continue the compression wrap with silver cell 4 x 4's and a Dynaflex wrap.  Imaging: No results found.    Labs: Lab Results  Component Value Date   HGBA1C 6.0 (H) 03/08/2020   HGBA1C 6.0 (H) 02/04/2020   REPTSTATUS 03/15/2020 FINAL 03/10/2020   GRAMSTAIN  03/10/2020    MODERATE WBC PRESENT, PREDOMINANTLY PMN NO ORGANISMS SEEN    CULT  03/10/2020    RARE ENTEROBACTER CLOACAE CRITICAL RESULT CALLED TO, READ BACK BY AND VERIFIED WITH: RN TGilford Rile [phone] FCP NO ANAEROBES ISOLATED Performed at [facility], [place] [address], [place], [place] [address]    [place] 03/10/2020     Lab  Results  Component Value Date   ALBUMIN 2.7 (L) 03/10/2020   ALBUMIN 2.9 (L) 03/08/2020   ALBUMIN 2.9 (L) 02/16/2020    Lab Results  Component Value Date   MG 2.1 02/08/2020   MG 2.0 02/04/2020   MG 1.9 02/03/2020   Lab Results  Component Value Date   VD25OH 19.57 (L) 03/08/2020   VD25OH 16.36 (L) 02/16/2020    No results found for: PREALBUMIN CBC EXTENDED Latest Ref Rng & Units 03/08/2020 02/16/2020 02/10/2020  WBC 4.0 - 10.5 K/uL 10.6(H) 8.4 10.4  RBC 4.22 - 5.81 MIL/uL 4.50 4.16(L) 4.13(L)  HGB 13.0 - 17.0 g/dL 12.7(L) 12.4(L) 12.3(L)  HCT 39.0 - 52.0 % 42.0 38.6(L) 38.7(L)  PLT 150 - 400 K/uL 460(H) 302 322  NEUTROABS 1.7 - 7.7 K/uL 7.7 - -  LYMPHSABS 0.7 - 4.0 K/uL 1.5 - -     There is no height or weight on file to calculate BMI.  Orders:  No orders of the defined types were placed in this encounter.  No orders of the defined types were placed in this encounter.    Procedures: No procedures performed  Clinical Data: No additional findings.  ROS:  All other systems negative, except as noted in the HPI. Review of Systems  Objective: Vital Signs:  There were no vitals taken for this visit.  Specialty Comments:  No specialty comments available.  PMFS History: Patient Active Problem List   Diagnosis Date Noted  . A-fib ([place])   . OSA (obstructive sleep apnea) 03/09/2020  . Wound dehiscence, traumatic injury repair, left medial ankle  03/08/2020  . Vitamin D deficiency 02/16/2020  . Chronic, continuous use of opioids 02/15/2020  . Open fracture dislocation of left ankle 01/31/2020  . Ankle fracture 01/31/2020  . Permanent atrial fibrillation ([place])   . Hypertension   . Diabetes ([place])   . Bee sting allergy   . Hypotension 12/25/2017  . Chronic anticoagulation 03/07/2017  . Coronary artery calcification seen on CT scan 02/07/2017  . CAD in native artery 02/07/2017  . Persistent atrial fibrillation ([place]) 02/06/2017  . Anxiety 02/06/2017  . Arthritis  02/06/2017  . Pernicious anemia 02/06/2017  . Allergy to pollen 04/06/2015  . OSA treated with BiPAP 12/14/2014  . Obesity hypoventilation syndrome ([place]) 12/14/2014  . Morbid obesity with BMI of 40.0-44.9, adult ([place]) 12/14/2014  . Hypertensive heart disease 12/14/2014  . Hyperlipidemia, mixed 12/14/2014  . Type 2 diabetes mellitus with diabetic neuropathy ([place]) 12/14/2014  . Chronic venous insufficiency 12/14/2014  . Edema 12/14/2014  . Morbid obesity ([place]) 12/14/2014  . Bell's palsy 02/24/2013  . Polyneuropathy in diabetes ([place]) 02/24/2013  . Syndrome affecting cervical region 02/24/2013  . Trigeminal neuralgia 02/24/2013  . Lumbar pain with radiation down left leg 07/09/2012  . BMI 45.0-49.9, adult ([place]) 07/09/2012  . Chronic pain syndrome 07/09/2012  . Morbid obesity with BMI of 45.0-49.9, adult ([place]) 07/09/2012  . Pain syndrome, chronic 07/09/2012   Past Medical History:  Diagnosis Date  . A-fib ([place])   . Allergy to pollen 04/06/2015  . Anxiety 02/06/2017  . Arthritis 02/06/2017  . Bee sting allergy    wasp / mixed vespid  . Bell's palsy 02/24/2013  . Chronic venous insufficiency 12/14/2014  . Chronic, continuous use of opioids 02/15/2020  . Diabetes ([place])   . Edema 12/14/2014  . Hyperlipidemia, mixed 12/14/2014  . Hypertension   . Lumbar pain with radiation down left leg 07/09/2012  . Morbid obesity ([place]) 12/14/2014  . Morbid obesity with BMI of 45.0-49.9, adult ([place]) 07/09/2012  . Obesity hypoventilation syndrome ([place]) 12/14/2014  . OSA (obstructive sleep apnea) 03/09/2020  . OSA treated with BiPAP 12/14/2014  . Pain syndrome, chronic 07/09/2012  . Pernicious anemia 02/06/2017  . Polyneuropathy in diabetes ([place]) 02/24/2013  . Syndrome affecting cervical region 02/24/2013  . Trigeminal neuralgia 02/24/2013  . Type 2 diabetes mellitus with diabetic neuropathy ([place]) 12/14/2014  . Vitamin D deficiency 02/16/2020  . Wound dehiscence, traumatic injury repair, left medial ankle  03/08/2020     Family History  Problem Relation Age of Onset  . Breast cancer Mother   . Heart disease Father   . CAD Father   . Atrial fibrillation Father   . Stomach cancer Paternal Aunt   . Heart disease Paternal Uncle   . Breast cancer Maternal Grandmother     Past Surgical History:  Procedure Laterality Date  . ADENOIDECTOMY    . BACK SURGERY  2002 /2003  . CARPAL TUNNEL RELEASE Bilateral   . I & D EXTREMITY Left 01/31/2020   Procedure: IRRIGATION AND DEBRIDEMENT ANKLE;  Surgeon: Altamese Rio Lajas, MD;  Location: [facility];  Service: Orthopedics;  Laterality: Left;  . I & D EXTREMITY Left 03/10/2020   Procedure: DEBRIDEMENT LEFT ANKLE, APPLY SKIN GRAFT;  Surgeon: Sharol Given,  Illene Regulus, MD;  Location: [facility];  Service: Orthopedics;  Laterality: Left;  . KNEE SURGERY Left 2005  . ORIF ANKLE FRACTURE Left 01/31/2020  . ORIF ANKLE FRACTURE Left 01/31/2020   Procedure: OPEN REDUCTION INTERNAL FIXATION (ORIF) ANKLE FRACTURE;  Surgeon: Altamese [place], MD;  Location: [facility];  Service: Orthopedics;  Laterality: Left;  . SHOULDER SURGERY Left 1998  . TONSILLECTOMY     Social History   Occupational History  . Not on file  Tobacco Use  . Smoking status: Former Smoker    Packs/day: 3.00    Years: 35.00    Pack years: 105.00    Types: Cigarettes    Quit date: 04/13/2006    Years since quitting: 13.9  . Smokeless tobacco: Never Used  Vaping Use  . Vaping Use: Never used  Substance and Sexual Activity  . Alcohol use: No    Alcohol/week: 0.0 standard drinks  . Drug use: No  . Sexual activity: Not on file

## 2020-04-04 NOTE — Progress Notes (Signed)
Anesthesia Chart Review: SAME DAY WORK-UP   Case: [id number] Date/Time: 04/05/20 1303   Procedure: INTERNAL FIXATION LEFT TIBIA TO CALCANEUS AND APPLY SKIN GRAFT (Left Ankle)   Anesthesia type: Choice   Pre-op diagnosis: Hardware Failure Left Ankle   Location: [facility] OR ADD ON ROOM 01 / [facility]   Surgeons: [name], MD      DISCUSSION: Patient is a 60 year old male scheduled for the above procedure. He fell on 01/31/20 and sustained an open dislocation of left ankle, s/p ORIF left ankle trimalleolar fracture, open treatment of left ankle dislocation with K-wire fixation of tibiotalar joint, ORIF left ankle syndesmosis 01/31/20. He remained hospitalized until 02/16/20 while awaiting SNF placement. He was discharged in a short leg case. At 03/08/20 orthopedic trauma follow-up, he had developed bloody drainage. Cast removed and noted to have dehiscence of traumatic medial malleolar wound with cellulitis. Admitted for IV antibiotic and s/p debridement left ankle, skin graft 03/10/20 by Dr. Sharol Given.   History includes former smoker (quit 04/13/06), afib, HTN, DM2 (wtih polyneuropathy), chronic venous insufficiency, HLD, edema, chronic pain, pernicious anemia, OSA/obesity hypoventilation syndrome (not using BiPAP), GERD, back surgery (L4-5 laminectomy/Ray Cage interbody fusion 02/19/01; L5-S1 posterolateral fusion/PLIF 08/27/02).   Last visit with cardiologist Dr. Bettina Gavia 03/30/20. Maintaining rate controlled afib. Tamsulosin discontinued due to orthostatic hypotension. Six month follow-up planned.  Dr. Sharol Given did not instruct patient to hold Eliquis for procedure. Patient or his mother will follow-up with Dr. Sharol Given to clarify if Eliquis will need to be held for procedure. 04/04/20 presurgical COVID-19 test is in process. Anesthesia team to evaluate on the day of surgery. He is a same day work-up.   VS:   Wt Readings from Last 3 Encounters:  03/30/20 (!) 165.6 kg  03/10/20 (!) 162.8 kg  01/31/20 (!) 170 kg   BP  Readings from Last 3 Encounters:  03/30/20 139/77  03/14/20 (!) 158/80  02/16/20 137/66   Pulse Readings from Last 3 Encounters:  03/30/20 (!) 110  03/14/20 88  02/16/20 90    PROVIDERS: Angelina Sheriff, MD is PCP  Shirlee More, MD is cardiologist   LABS: For day of surgery. Currently, last labs results include: Lab Results  Component Value Date   WBC 10.6 (H) 03/08/2020   HGB 12.7 (L) 03/08/2020   HCT 42.0 03/08/2020   PLT 460 (H) 03/08/2020   GLUCOSE 115 (H) 03/10/2020   ALT 19 03/10/2020   AST 22 03/10/2020   NA 138 03/10/2020   K 3.8 03/10/2020   CL 99 03/10/2020   CREATININE 1.10 03/10/2020   BUN 9 03/10/2020   CO2 29 03/10/2020   INR 1.4 (H) 03/08/2020   HGBA1C 6.0 (H) 03/08/2020    EKG: 01/31/20: Atrial fibrillation Left posterior fascicular block Anterior infarct, old Low voltage QRS When compared with ECG of 08/25/2002, Atrial fibrillation has replaced Sinus rhythm Left posterior fasicular block is now present Low voltage QRS is now present Confirmed by Delora Fuel ([id number]) on 01/31/2020 4:23:46 AM   CV: Long Term event monitor 09/28/19-10/05/19: - Is a monitor was performed for 7 days beginning 09/28/2019 to assess heart rate response with chronic atrial fibrillation. - Daytime his heart rate profile was 50-110 86% of the time, 2% 1 10-1 50 and less than 1% greater than 150 bpm. - Nighttime his heart rate profile was 50-110 87% of the time, 110 to 152% of the time and greater than 150 less than 1% of the time. - No pauses  of 3 seconds or greater. - Regular ectopy was rare with isolated PVCs 2 couplets and one triplet. - Acute event was seen with atrial fibrillation. - Occlusion atrial fibrillation, heart rate control is good.   Echo 02/10/17: Study Conclusions  - Left ventricle: The cavity size was normal. Systolic function was  normal. The estimated ejection fraction was in the range of 55%  to 60%. Wall motion was normal; there were no  regional wall  motion abnormalities.  - Left atrium: The atrium was mildly to moderately dilated.  Impressions:  - Technically difficult and limited study.    Past Medical History:  Diagnosis Date  . A-fib ([place])   . Allergy to pollen 04/06/2015  . Anxiety 02/06/2017  . Arthritis 02/06/2017  . Asthma    as a child  . Bee sting allergy    wasp / mixed vespid  . Bell's palsy 02/24/2013  . Chronic venous insufficiency 12/14/2014  . Chronic, continuous use of opioids 02/15/2020  . Depression   . Diabetes ([place])   . Edema 12/14/2014  . GERD (gastroesophageal reflux disease)   . History of kidney stones   . Hyperlipidemia, mixed 12/14/2014  . Hypertension   . Lumbar pain with radiation down left leg 07/09/2012  . Morbid obesity ([place]) 12/14/2014  . Morbid obesity with BMI of 45.0-49.9, adult ([place]) 07/09/2012  . Obesity hypoventilation syndrome ([place]) 12/14/2014  . OSA (obstructive sleep apnea) 03/09/2020   doesn't use a Cpap  . OSA treated with BiPAP 12/14/2014  . Pain syndrome, chronic 07/09/2012  . Pernicious anemia 02/06/2017  . Pneumonia   . Polyneuropathy in diabetes ([place]) 02/24/2013  . Syndrome affecting cervical region 02/24/2013  . Trigeminal neuralgia 02/24/2013  . Type 2 diabetes mellitus with diabetic neuropathy ([place]) 12/14/2014  . Vitamin D deficiency 02/16/2020  . Wound dehiscence, traumatic injury repair, left medial ankle  03/08/2020    Past Surgical History:  Procedure Laterality Date  . ADENOIDECTOMY    . BACK SURGERY  2002 /2003  . CARPAL TUNNEL RELEASE Bilateral   . I & D EXTREMITY Left 01/31/2020   Procedure: IRRIGATION AND DEBRIDEMENT ANKLE;  Surgeon: Altamese Marmet, MD;  Location: [facility];  Service: Orthopedics;  Laterality: Left;  . I & D EXTREMITY Left 03/10/2020   Procedure: DEBRIDEMENT LEFT ANKLE, APPLY SKIN GRAFT;  Surgeon: [name], MD;  Location: [facility];  Service: Orthopedics;  Laterality: Left;  . KNEE SURGERY Left 2005  . ORIF ANKLE FRACTURE Left 01/31/2020  . ORIF  ANKLE FRACTURE Left 01/31/2020   Procedure: OPEN REDUCTION INTERNAL FIXATION (ORIF) ANKLE FRACTURE;  Surgeon: Altamese Benson, MD;  Location: [facility];  Service: Orthopedics;  Laterality: Left;  . SHOULDER SURGERY Left 1998  . TONSILLECTOMY      MEDICATIONS: No current facility-administered medications for this encounter.   Marland Kitchen ascorbic acid (VITAMIN C) 1000 MG tablet  . Azelastine-Fluticasone 137-50 MCG/ACT SUSP  . Cholecalciferol (VITAMIN D) 125 MCG (5000 UT) CAPS  . dicyclomine (BENTYL) 20 MG tablet  . docusate sodium (COLACE) 100 MG capsule  . DULoxetine (CYMBALTA) 60 MG capsule  . ELIQUIS 5 MG TABS tablet  . EPINEPHrine 0.3 mg/0.3 mL IJ SOAJ injection  . furosemide (LASIX) 40 MG tablet  . gabapentin (NEURONTIN) 300 MG capsule  . metFORMIN (GLUCOPHAGE) 500 MG tablet  . metoprolol tartrate (LOPRESSOR) 25 MG tablet  . naloxone (NARCAN) nasal spray 4 mg/0.1 mL  . naproxen sodium (ALEVE) 220 MG tablet  . nitroGLYCERIN (NITROSTAT) 0.4 MG SL tablet  .  ondansetron (ZOFRAN-ODT) 4 MG disintegrating tablet  . oxyCODONE-acetaminophen (PERCOCET) 7.5-325 MG tablet  . pantoprazole (PROTONIX) 40 MG tablet  . simvastatin (ZOCOR) 20 MG tablet  . sulfamethoxazole-trimethoprim (BACTRIM DS) 800-160 MG tablet  . traMADol (ULTRAM) 50 MG tablet  . vitamin B-12 1000 MCG tablet  . metoprolol tartrate (LOPRESSOR) 50 MG tablet    Myra Gianotti, PA-C Surgical Short Stay/Anesthesiology [facility] Phone [phone] [facility] Phone [phone] 04/04/2020 1:11 PM

## 2020-04-04 NOTE — Progress Notes (Signed)
Spoke with pt's mother David Fitzgerald for pre-op call. DPR on file. Pt has hx of A-fib and is followed by Dr. Bettina Gavia. Pt is on Eliquis, he has not been instructed to hold it. David Fitzgerald is concerned with that, I did tell her that Dr. Sharol Given doesn't usually stop the blood thinners prior to surgery, but that I would call and double check with his office for her and I will call her back. She voiced understanding.  Pt is a type 2 Diabetic. Last A1C was 6.0 on 03/08/20. Pt does not check his blood sugar at home. Instructed David Fitzgerald to have pt not take his Metformin in the AM.  Covid test done today, result pending. David Fitzgerald states patient has been in quarantine since the test was done and understands that he stays in quarantine until he comes to the hospital tomorrow.   Chart sent to Anesthesia PA for review.

## 2020-04-04 NOTE — Anesthesia Preprocedure Evaluation (Addendum)
Anesthesia Evaluation  Patient identified by MRN, date of birth, ID band Patient awake    Reviewed: Allergy & Precautions, H&P , NPO status , Patient's Chart, lab work & pertinent test results, reviewed documented beta blocker date and time   Airway Mallampati: III  TM Distance: >3 FB Neck ROM: Full    Dental  (+) Edentulous Upper, Edentulous Lower   Pulmonary asthma , sleep apnea (noncompliant w/ bipap) and Continuous Positive Airway Pressure Ventilation , former smoker,  Quit smoking 2008, 105 pack year history    Pulmonary exam normal breath sounds clear to auscultation       Cardiovascular hypertension, Pt. on medications and Pt. on home beta blockers + CAD  Normal cardiovascular exam+ dysrhythmias (last eliquis this AM) Atrial Fibrillation  Rhythm:Regular Rate:Normal  Echo 2019: - Left ventricle: The cavity size was normal. Systolic function was  normal. The estimated ejection fraction was in the range of 55%  to 60%. Wall motion was normal; there were no regional wall  motion abnormalities.  - Left atrium: The atrium was mildly to moderately dilated.    Neuro/Psych PSYCHIATRIC DISORDERS Anxiety  Neuromuscular disease (peripheral neuropathy 2/2 T2DM)    GI/Hepatic Neg liver ROS, GERD  Medicated and Controlled,  Endo/Other  diabetes, Well Controlled, Type 2, Oral Hypoglycemic AgentsMorbid obesityBMI 47 a1c 6.0 FS 110  Renal/GU negative Renal ROS  negative genitourinary   Musculoskeletal  (+) Arthritis , Osteoarthritis,  Hardware failure L ankle chronic pain    Abdominal   Peds negative pediatric ROS (+)  Hematology negative hematology ROS (+)   Anesthesia Other Findings   Reproductive/Obstetrics negative OB ROS                                                            Anesthesia Evaluation  Patient identified by MRN, date of birth, ID band Patient awake    Reviewed: Allergy  & Precautions, NPO status , Patient's Chart, lab work & pertinent test results, reviewed documented beta blocker date and time   History of Anesthesia Complications Negative for: history of anesthetic complications  Airway Mallampati: II  TM Distance: >3 FB Neck ROM: Full    Dental  (+) Edentulous Upper, Edentulous Lower   Pulmonary sleep apnea and Continuous Positive Airway Pressure Ventilation , former smoker,    Pulmonary exam normal        Cardiovascular hypertension, Pt. on medications and Pt. on home beta blockers + CAD  Normal cardiovascular exam+ dysrhythmias Atrial Fibrillation      Neuro/Psych Anxiety negative neurological ROS     GI/Hepatic negative GI ROS, Neg liver ROS,   Endo/Other  diabetes, Type 2Morbid obesity  Renal/GU negative Renal ROS  negative genitourinary   Musculoskeletal  (+) Arthritis ,   Abdominal   Peds  Hematology  (+) anemia ,   Anesthesia Other Findings   Reproductive/Obstetrics                           Anesthesia Physical Anesthesia Plan  ASA: III  Anesthesia Plan: General   Post-op Pain Management:    Induction: Intravenous  PONV Risk Score and Plan: 2 and Ondansetron, Dexamethasone, Midazolam and Treatment may vary due to age or medical condition  Airway Management Planned: LMA  Additional  Equipment: None  Intra-op Plan:   Post-operative Plan: Extubation in OR  Informed Consent: I have reviewed the patients History and Physical, chart, labs and discussed the procedure including the risks, benefits and alternatives for the proposed anesthesia with the patient or authorized representative who has indicated his/her understanding and acceptance.     Dental advisory given  Plan Discussed with:   Anesthesia Plan Comments:         Anesthesia Quick Evaluation  Anesthesia Physical Anesthesia Plan  ASA: III  Anesthesia Plan: General and Regional   Post-op Pain  Management: GA combined w/ Regional for post-op pain   Induction: Intravenous  PONV Risk Score and Plan: 2 and Ondansetron, Dexamethasone, Midazolam and Treatment may vary due to age or medical condition  Airway Management Planned: LMA  Additional Equipment: None  Intra-op Plan:   Post-operative Plan: Extubation in OR  Informed Consent: I have reviewed the patients History and Physical, chart, labs and discussed the procedure including the risks, benefits and alternatives for the proposed anesthesia with the patient or authorized representative who has indicated his/her understanding and acceptance.     Dental advisory given  Plan Discussed with: CRNA  Anesthesia Plan Comments: (Had issue w/ fentanyl and low BP in December (per pt). Also got it in February but did not have issues.)     Anesthesia Quick Evaluation

## 2020-04-05 ENCOUNTER — Encounter (HOSPITAL_COMMUNITY)
Admission: AD | Disposition: A | Discharge status: Home / Self Care | Payer: Self-pay | Source: Home / Self Care | Attending: Orthopedic Surgery

## 2020-04-05 ENCOUNTER — Encounter (HOSPITAL_COMMUNITY): Payer: Self-pay | Admitting: Orthopedic Surgery

## 2020-04-05 ENCOUNTER — Ambulatory Visit (HOSPITAL_COMMUNITY): Payer: Medicare HMO | Admitting: Vascular Surgery

## 2020-04-05 ENCOUNTER — Other Ambulatory Visit: Payer: Self-pay

## 2020-04-05 ENCOUNTER — Inpatient Hospital Stay (HOSPITAL_COMMUNITY)
Admission: AD | Admit: 2020-04-05 | Discharge: 2020-04-07 | DRG: 464 | Disposition: A | Discharge status: Home / Self Care | Payer: Medicare HMO | Attending: Orthopedic Surgery | Admitting: Orthopedic Surgery

## 2020-04-05 DIAGNOSIS — I4891 Unspecified atrial fibrillation: Secondary | ICD-10-CM | POA: Diagnosis present

## 2020-04-05 DIAGNOSIS — L97329 Non-pressure chronic ulcer of left ankle with unspecified severity: Secondary | ICD-10-CM | POA: Diagnosis present

## 2020-04-05 DIAGNOSIS — Z87891 Personal history of nicotine dependence: Secondary | ICD-10-CM | POA: Diagnosis not present

## 2020-04-05 DIAGNOSIS — T8133XD Disruption of traumatic injury wound repair, subsequent encounter: Secondary | ICD-10-CM

## 2020-04-05 DIAGNOSIS — G4733 Obstructive sleep apnea (adult) (pediatric): Secondary | ICD-10-CM | POA: Diagnosis not present

## 2020-04-05 DIAGNOSIS — Z885 Allergy status to narcotic agent status: Secondary | ICD-10-CM | POA: Diagnosis not present

## 2020-04-05 DIAGNOSIS — Z79891 Long term (current) use of opiate analgesic: Secondary | ICD-10-CM

## 2020-04-05 DIAGNOSIS — Z9989 Dependence on other enabling machines and devices: Secondary | ICD-10-CM | POA: Diagnosis not present

## 2020-04-05 DIAGNOSIS — S91002A Unspecified open wound, left ankle, initial encounter: Secondary | ICD-10-CM

## 2020-04-05 DIAGNOSIS — Z79899 Other long term (current) drug therapy: Secondary | ICD-10-CM | POA: Diagnosis not present

## 2020-04-05 DIAGNOSIS — S9305XA Dislocation of left ankle joint, initial encounter: Secondary | ICD-10-CM

## 2020-04-05 DIAGNOSIS — Z880 Allergy status to penicillin: Secondary | ICD-10-CM

## 2020-04-05 DIAGNOSIS — E782 Mixed hyperlipidemia: Secondary | ICD-10-CM | POA: Diagnosis present

## 2020-04-05 DIAGNOSIS — S9305XS Dislocation of left ankle joint, sequela: Secondary | ICD-10-CM

## 2020-04-05 DIAGNOSIS — Z7901 Long term (current) use of anticoagulants: Secondary | ICD-10-CM | POA: Diagnosis not present

## 2020-04-05 DIAGNOSIS — S91002S Unspecified open wound, left ankle, sequela: Secondary | ICD-10-CM

## 2020-04-05 DIAGNOSIS — K219 Gastro-esophageal reflux disease without esophagitis: Secondary | ICD-10-CM | POA: Diagnosis present

## 2020-04-05 DIAGNOSIS — M25572 Pain in left ankle and joints of left foot: Secondary | ICD-10-CM | POA: Diagnosis present

## 2020-04-05 DIAGNOSIS — Z7984 Long term (current) use of oral hypoglycemic drugs: Secondary | ICD-10-CM | POA: Diagnosis not present

## 2020-04-05 DIAGNOSIS — T84117A Breakdown (mechanical) of internal fixation device of bone of left lower leg, initial encounter: Principal | ICD-10-CM | POA: Diagnosis present

## 2020-04-05 DIAGNOSIS — Z8249 Family history of ischemic heart disease and other diseases of the circulatory system: Secondary | ICD-10-CM | POA: Diagnosis not present

## 2020-04-05 DIAGNOSIS — Y831 Surgical operation with implant of artificial internal device as the cause of abnormal reaction of the patient, or of later complication, without mention of misadventure at the time of the procedure: Secondary | ICD-10-CM | POA: Diagnosis present

## 2020-04-05 DIAGNOSIS — E1142 Type 2 diabetes mellitus with diabetic polyneuropathy: Secondary | ICD-10-CM | POA: Diagnosis present

## 2020-04-05 DIAGNOSIS — Z20822 Contact with and (suspected) exposure to covid-19: Secondary | ICD-10-CM | POA: Diagnosis present

## 2020-04-05 DIAGNOSIS — Z886 Allergy status to analgesic agent status: Secondary | ICD-10-CM

## 2020-04-05 DIAGNOSIS — G500 Trigeminal neuralgia: Secondary | ICD-10-CM | POA: Diagnosis present

## 2020-04-05 DIAGNOSIS — I10 Essential (primary) hypertension: Secondary | ICD-10-CM | POA: Diagnosis present

## 2020-04-05 DIAGNOSIS — E662 Morbid (severe) obesity with alveolar hypoventilation: Secondary | ICD-10-CM | POA: Diagnosis present

## 2020-04-05 DIAGNOSIS — Z91030 Bee allergy status: Secondary | ICD-10-CM

## 2020-04-05 DIAGNOSIS — Z6841 Body Mass Index (BMI) 40.0 and over, adult: Secondary | ICD-10-CM

## 2020-04-05 DIAGNOSIS — I4819 Other persistent atrial fibrillation: Secondary | ICD-10-CM | POA: Diagnosis not present

## 2020-04-05 DIAGNOSIS — G8918 Other acute postprocedural pain: Secondary | ICD-10-CM | POA: Diagnosis not present

## 2020-04-05 DIAGNOSIS — T8489XA Other specified complication of internal orthopedic prosthetic devices, implants and grafts, initial encounter: Secondary | ICD-10-CM | POA: Diagnosis not present

## 2020-04-05 DIAGNOSIS — S91002D Unspecified open wound, left ankle, subsequent encounter: Secondary | ICD-10-CM

## 2020-04-05 HISTORY — DX: Pneumonia, unspecified organism: J18.9

## 2020-04-05 HISTORY — DX: Cardiac arrhythmia, unspecified: I49.9

## 2020-04-05 HISTORY — PX: OTHER SURGICAL HISTORY: SHX169

## 2020-04-05 HISTORY — DX: Pain in left ankle and joints of left foot: M25.572

## 2020-04-05 HISTORY — DX: Unspecified asthma, uncomplicated: J45.909

## 2020-04-05 HISTORY — DX: Depression, unspecified: F32.A

## 2020-04-05 HISTORY — PX: ORIF ANKLE FRACTURE: SHX5408

## 2020-04-05 HISTORY — DX: Personal history of urinary calculi: Z87.442

## 2020-04-05 HISTORY — DX: Gastro-esophageal reflux disease without esophagitis: K21.9

## 2020-04-05 LAB — BASIC METABOLIC PANEL
Anion gap: 10 (ref 5–15)
BUN: 18 mg/dL (ref 6–20)
CO2: 29 mmol/L (ref 22–32)
Calcium: 8.8 mg/dL — ABNORMAL LOW (ref 8.9–10.3)
Chloride: 96 mmol/L — ABNORMAL LOW (ref 98–111)
Creatinine, Ser: 1.1 mg/dL (ref 0.61–1.24)
GFR, Estimated: >60 mL/min (ref >60)
Glucose, Bld: 112 mg/dL — ABNORMAL HIGH (ref 70–99)
Potassium: 4.9 mmol/L (ref 3.5–5.1)
Sodium: 135 mmol/L (ref 135–145)

## 2020-04-05 LAB — GLUCOSE, CAPILLARY
Glucose-Capillary: 110 mg/dL — ABNORMAL HIGH (ref 70–99)
Glucose-Capillary: 119 mg/dL — ABNORMAL HIGH (ref 70–99)

## 2020-04-05 LAB — SARS CORONAVIRUS 2 (TAT 6-24 HRS): SARS Coronavirus 2: NEGATIVE

## 2020-04-05 SURGERY — OPEN REDUCTION INTERNAL FIXATION (ORIF) ANKLE FRACTURE
Anesthesia: General | Site: Ankle | Laterality: Left

## 2020-04-05 MED ORDER — OXYCODONE-ACETAMINOPHEN 7.5-325 MG PO TABS: 1.0000 | ORAL_TABLET | Freq: Three times a day (TID) | ORAL | Status: DC | PRN

## 2020-04-05 MED ORDER — METFORMIN HCL 500 MG PO TABS
500.0000 mg | ORAL_TABLET | Freq: Every day | ORAL | Status: DC
  Administered 2020-04-06 – 2020-04-07 (×2): 500 mg via ORAL
  Filled 2020-04-05 (×2): qty 1

## 2020-04-05 MED ORDER — CLINDAMYCIN PHOSPHATE 900 MG/50ML IV SOLN
INTRAVENOUS | Status: AC
  Filled 2020-04-05: qty 50

## 2020-04-05 MED ORDER — MIDAZOLAM HCL 2 MG/2ML IJ SOLN
INTRAMUSCULAR | Status: AC
  Administered 2020-04-05: 2 mg via INTRAVENOUS
  Filled 2020-04-05: qty 2

## 2020-04-05 MED ORDER — SODIUM CHLORIDE 0.9 % IV SOLN
2.0000 g | Freq: Three times a day (TID) | INTRAVENOUS | Status: DC
  Administered 2020-04-05 – 2020-04-07 (×6): 2 g via INTRAVENOUS
  Filled 2020-04-05 (×6): qty 2

## 2020-04-05 MED ORDER — ALBUMIN HUMAN 5 % IV SOLN
INTRAVENOUS | Status: AC
  Administered 2020-04-05: 12.5 g via INTRAVENOUS
  Filled 2020-04-05: qty 250

## 2020-04-05 MED ORDER — ONDANSETRON HCL 4 MG/2ML IJ SOLN: 4.0000 mg | Freq: Four times a day (QID) | INTRAMUSCULAR | Status: DC | PRN

## 2020-04-05 MED ORDER — LACTATED RINGERS IV SOLN: INTRAVENOUS | Status: DC | PRN

## 2020-04-05 MED ORDER — CLINDAMYCIN PHOSPHATE 900 MG/50ML IV SOLN
900.0000 mg | INTRAVENOUS | Status: AC
  Administered 2020-04-05: 900 mg via INTRAVENOUS

## 2020-04-05 MED ORDER — DOCUSATE SODIUM 100 MG PO CAPS
100.0000 mg | ORAL_CAPSULE | Freq: Two times a day (BID) | ORAL | Status: DC
  Administered 2020-04-05 – 2020-04-07 (×4): 100 mg via ORAL
  Filled 2020-04-05 (×4): qty 1

## 2020-04-05 MED ORDER — ONDANSETRON HCL 4 MG PO TABS: 4.0000 mg | ORAL_TABLET | Freq: Four times a day (QID) | ORAL | Status: DC | PRN

## 2020-04-05 MED ORDER — PHENYLEPHRINE HCL (PRESSORS) 10 MG/ML IV SOLN
INTRAVENOUS | Status: DC | PRN
  Administered 2020-04-05 (×4): 80 ug via INTRAVENOUS
  Administered 2020-04-05: 120 ug via INTRAVENOUS
  Administered 2020-04-05 (×2): 80 ug via INTRAVENOUS

## 2020-04-05 MED ORDER — OXYCODONE HCL 5 MG PO TABS: 5.0000 mg | ORAL_TABLET | Freq: Once | ORAL | Status: DC | PRN

## 2020-04-05 MED ORDER — GABAPENTIN 300 MG PO CAPS
600.0000 mg | ORAL_CAPSULE | Freq: Three times a day (TID) | ORAL | Status: DC
  Administered 2020-04-05 – 2020-04-07 (×6): 600 mg via ORAL
  Filled 2020-04-05 (×6): qty 2

## 2020-04-05 MED ORDER — SIMVASTATIN 20 MG PO TABS
20.0000 mg | ORAL_TABLET | Freq: Every day | ORAL | Status: DC
  Administered 2020-04-05 – 2020-04-06 (×2): 20 mg via ORAL
  Filled 2020-04-05 (×2): qty 1

## 2020-04-05 MED ORDER — METOPROLOL TARTRATE 25 MG PO TABS
25.0000 mg | ORAL_TABLET | Freq: Once | ORAL | Status: AC
  Administered 2020-04-05: 25 mg via ORAL
  Filled 2020-04-05: qty 1

## 2020-04-05 MED ORDER — MIDAZOLAM HCL 2 MG/2ML IJ SOLN
2.0000 mg | Freq: Once | INTRAMUSCULAR | Status: AC
  Filled 2020-04-05: qty 2

## 2020-04-05 MED ORDER — PANTOPRAZOLE SODIUM 40 MG PO TBEC
40.0000 mg | DELAYED_RELEASE_TABLET | Freq: Every day | ORAL | Status: DC
  Administered 2020-04-05 – 2020-04-07 (×3): 40 mg via ORAL
  Filled 2020-04-05 (×3): qty 1

## 2020-04-05 MED ORDER — FUROSEMIDE 40 MG PO TABS: 40.0000 mg | ORAL_TABLET | Freq: Every day | ORAL | Status: DC | PRN

## 2020-04-05 MED ORDER — METOPROLOL TARTRATE 25 MG PO TABS: 25.0000 mg | ORAL_TABLET | Freq: Once | ORAL | Status: DC | PRN

## 2020-04-05 MED ORDER — HYDROMORPHONE HCL 1 MG/ML IJ SOLN: 0.2500 mg | INTRAMUSCULAR | Status: DC | PRN

## 2020-04-05 MED ORDER — FENTANYL CITRATE (PF) 100 MCG/2ML IJ SOLN
INTRAMUSCULAR | Status: AC
  Filled 2020-04-05: qty 2

## 2020-04-05 MED ORDER — ALBUMIN HUMAN 5 % IV SOLN
12.5000 g | Freq: Once | INTRAVENOUS | Status: AC
  Filled 2020-04-05: qty 250

## 2020-04-05 MED ORDER — 0.9 % SODIUM CHLORIDE (POUR BTL) OPTIME
TOPICAL | Status: DC | PRN
  Administered 2020-04-05: 1000 mL

## 2020-04-05 MED ORDER — ROPIVACAINE HCL 5 MG/ML IJ SOLN
INTRAMUSCULAR | Status: DC | PRN
  Administered 2020-04-05: 40 mL via PERINEURAL

## 2020-04-05 MED ORDER — ONDANSETRON HCL 4 MG/2ML IJ SOLN: 4.0000 mg | Freq: Once | INTRAMUSCULAR | Status: DC | PRN

## 2020-04-05 MED ORDER — METOPROLOL TARTRATE 50 MG PO TABS
50.0000 mg | ORAL_TABLET | Freq: Every day | ORAL | Status: DC
  Administered 2020-04-07: 50 mg via ORAL
  Filled 2020-04-05 (×2): qty 1

## 2020-04-05 MED ORDER — METOCLOPRAMIDE HCL 5 MG PO TABS: 5.0000 mg | ORAL_TABLET | Freq: Three times a day (TID) | ORAL | Status: DC | PRN

## 2020-04-05 MED ORDER — METOCLOPRAMIDE HCL 5 MG/ML IJ SOLN
5.0000 mg | Freq: Three times a day (TID) | INTRAMUSCULAR | Status: DC | PRN
Start: 2020-04-05 — End: 2020-04-07

## 2020-04-05 MED ORDER — DEXAMETHASONE SODIUM PHOSPHATE 10 MG/ML IJ SOLN
INTRAMUSCULAR | Status: DC | PRN
  Administered 2020-04-05: 10 mg

## 2020-04-05 MED ORDER — ACETAMINOPHEN 325 MG PO TABS: 325.0000 mg | ORAL_TABLET | Freq: Four times a day (QID) | ORAL | Status: DC | PRN

## 2020-04-05 MED ORDER — OXYCODONE HCL 5 MG/5ML PO SOLN: 5.0000 mg | Freq: Once | ORAL | Status: DC | PRN

## 2020-04-05 MED ORDER — CHLORHEXIDINE GLUCONATE 0.12 % MT SOLN
OROMUCOSAL | Status: AC
  Administered 2020-04-05: 15 mL
  Filled 2020-04-05: qty 15

## 2020-04-05 MED ORDER — DULOXETINE HCL 60 MG PO CPEP
60.0000 mg | ORAL_CAPSULE | Freq: Every day | ORAL | Status: DC
  Administered 2020-04-06 – 2020-04-07 (×2): 60 mg via ORAL
  Filled 2020-04-05 (×2): qty 1

## 2020-04-05 MED ORDER — NITROGLYCERIN 0.4 MG SL SUBL: 0.4000 mg | SUBLINGUAL_TABLET | SUBLINGUAL | Status: DC | PRN

## 2020-04-05 MED ORDER — VANCOMYCIN HCL 1500 MG/300ML IV SOLN
1500.0000 mg | Freq: Two times a day (BID) | INTRAVENOUS | Status: DC
  Administered 2020-04-06 – 2020-04-07 (×3): 1500 mg via INTRAVENOUS
  Filled 2020-04-05 (×4): qty 300

## 2020-04-05 MED ORDER — SODIUM CHLORIDE 0.9 % IV SOLN: INTRAVENOUS | Status: DC

## 2020-04-05 MED ORDER — LIDOCAINE HCL (CARDIAC) PF 100 MG/5ML IV SOSY
PREFILLED_SYRINGE | INTRAVENOUS | Status: DC | PRN
  Administered 2020-04-05: 60 mg via INTRATRACHEAL

## 2020-04-05 MED ORDER — ONDANSETRON HCL 4 MG/2ML IJ SOLN
INTRAMUSCULAR | Status: DC | PRN
  Administered 2020-04-05: 4 mg via INTRAVENOUS

## 2020-04-05 MED ORDER — APIXABAN 5 MG PO TABS
5.0000 mg | ORAL_TABLET | Freq: Two times a day (BID) | ORAL | Status: DC
  Administered 2020-04-06 – 2020-04-07 (×3): 5 mg via ORAL
  Filled 2020-04-05 (×3): qty 1

## 2020-04-05 MED ORDER — HYDROMORPHONE HCL 1 MG/ML IJ SOLN
0.5000 mg | INTRAMUSCULAR | Status: DC | PRN
Start: 2020-04-05 — End: 2020-04-07

## 2020-04-05 MED ORDER — PHENYLEPHRINE HCL-NACL 10-0.9 MG/250ML-% IV SOLN
INTRAVENOUS | Status: DC | PRN
  Administered 2020-04-05: 75 ug/min via INTRAVENOUS

## 2020-04-05 MED ORDER — PROPOFOL 10 MG/ML IV BOLUS
INTRAVENOUS | Status: DC | PRN
  Administered 2020-04-05: 200 mg via INTRAVENOUS

## 2020-04-05 MED ORDER — ACETAMINOPHEN 500 MG PO TABS
1000.0000 mg | ORAL_TABLET | Freq: Once | ORAL | Status: AC
  Administered 2020-04-05: 1000 mg via ORAL
  Filled 2020-04-05: qty 2

## 2020-04-05 MED ORDER — VANCOMYCIN HCL 10 G IV SOLR
2500.0000 mg | Freq: Once | INTRAVENOUS | Status: AC
  Administered 2020-04-05: 2500 mg via INTRAVENOUS
  Filled 2020-04-05: qty 2500

## 2020-04-05 MED ORDER — SODIUM CHLORIDE 0.9 % IV SOLN: 2.0000 g | INTRAVENOUS | Status: DC

## 2020-04-05 SURGICAL SUPPLY — 45 items
BANDAGE ESMARK 6X9 LF (GAUZE/BANDAGES/DRESSINGS) IMPLANT
BIT DRILL CALIBRATED 4.3X320MM (BIT) IMPLANT
BIT DRILL CANN 7X200 (BIT) ×1 IMPLANT
BNDG CMPR 9X6 STRL LF SNTH (GAUZE/BANDAGES/DRESSINGS)
BNDG COHESIVE 4X5 TAN STRL (GAUZE/BANDAGES/DRESSINGS) ×2 IMPLANT
BNDG ESMARK 6X9 LF (GAUZE/BANDAGES/DRESSINGS)
BNDG GAUZE ELAST 4 BULKY (GAUZE/BANDAGES/DRESSINGS) ×2 IMPLANT
COVER SURGICAL LIGHT HANDLE (MISCELLANEOUS) ×2 IMPLANT
COVER WAND RF STERILE (DRAPES) ×2 IMPLANT
DRAPE OEC MINIVIEW 54X84 (DRAPES) IMPLANT
DRAPE U-SHAPE 47X51 STRL (DRAPES) ×2 IMPLANT
DRILL CALIBRATED 4.3X320MM (BIT) ×2
DRSG ADAPTIC 3X8 NADH LF (GAUZE/BANDAGES/DRESSINGS) ×2 IMPLANT
DRSG PAD ABDOMINAL 8X10 ST (GAUZE/BANDAGES/DRESSINGS) ×2 IMPLANT
DURAPREP 26ML APPLICATOR (WOUND CARE) ×2 IMPLANT
ELECT REM PT RETURN 9FT ADLT (ELECTROSURGICAL) ×2
ELECTRODE REM PT RTRN 9FT ADLT (ELECTROSURGICAL) ×1 IMPLANT
GAUZE SPONGE 4X4 12PLY STRL (GAUZE/BANDAGES/DRESSINGS) ×2 IMPLANT
GLOVE BIOGEL PI IND STRL 9 (GLOVE) ×1 IMPLANT
GLOVE BIOGEL PI INDICATOR 9 (GLOVE) ×1
GLOVE SURG ORTHO 9.0 STRL STRW (GLOVE) ×2 IMPLANT
GOWN STRL REUS W/ TWL XL LVL3 (GOWN DISPOSABLE) ×3 IMPLANT
GOWN STRL REUS W/TWL XL LVL3 (GOWN DISPOSABLE) ×6
GRAFT SKIN WND OMEGA3 7X10 (Tissue) ×2 IMPLANT
GRAFT SKN 7X10XSTRL LF DISP (Tissue) ×1 IMPLANT
GUIDEWIRE 2.6X80 BEAD TIP (WIRE) IMPLANT
GUIDWIRE 2.6X80 BEAD TIP (WIRE) ×4
KIT BASIN OR (CUSTOM PROCEDURE TRAY) ×2 IMPLANT
KIT TURNOVER KIT B (KITS) ×2 IMPLANT
MANIFOLD NEPTUNE II (INSTRUMENTS) ×2 IMPLANT
NAIL ANKLE LOCK ANTE 10X180 (Nail) ×1 IMPLANT
NS IRRIG 1000ML POUR BTL (IV SOLUTION) ×2 IMPLANT
PACK ORTHO EXTREMITY (CUSTOM PROCEDURE TRAY) ×2 IMPLANT
PAD ARMBOARD 7.5X6 YLW CONV (MISCELLANEOUS) ×4 IMPLANT
SCREW CORT TI DBL LEAD 5X75 (Screw) ×1 IMPLANT
SCREW CORT TI DBLE LEAD 5X28 (Screw) ×2 IMPLANT
STAPLER VISISTAT 35W (STAPLE) IMPLANT
SUCTION FRAZIER HANDLE 10FR (MISCELLANEOUS) ×2
SUCTION TUBE FRAZIER 10FR DISP (MISCELLANEOUS) ×1 IMPLANT
SUT ETHILON 2 0 PSLX (SUTURE) IMPLANT
SUT VIC AB 2-0 CT1 27 (SUTURE) ×2
SUT VIC AB 2-0 CT1 TAPERPNT 27 (SUTURE) ×1 IMPLANT
TOWEL GREEN STERILE (TOWEL DISPOSABLE) ×2 IMPLANT
TOWEL GREEN STERILE FF (TOWEL DISPOSABLE) ×2 IMPLANT
TUBE CONNECTING 12X1/4 (SUCTIONS) ×2 IMPLANT

## 2020-04-05 NOTE — Transfer of Care (Signed)
Immediate Anesthesia Transfer of Care Note  Patient: [name]  Procedure(s) Performed: INTERNAL FIXATION LEFT TIBIA TO CALCANEUS AND APPLY SKIN GRAFT (Left Ankle)  Patient Location: PACU  Anesthesia Type:GA combined with regional for post-op pain  Level of Consciousness: awake, alert , oriented and patient cooperative  Airway & Oxygen Therapy: Patient Spontanous Breathing and Patient connected to nasal cannula oxygen  Post-op Assessment: Report given to RN, Post -op Vital signs reviewed and stable and Patient moving all extremities  Post vital signs: Reviewed and stable  Last Vitals:  Vitals Value Taken Time  BP    Temp    Pulse 50 04/05/20 1341  Resp 13 04/05/20 1345  SpO2 85 % 04/05/20 1341  Vitals shown include unvalidated device data.  Last Pain:  Vitals:   04/05/20 1037  TempSrc: Oral  PainSc:          Complications: No complications documented.

## 2020-04-05 NOTE — Anesthesia Procedure Notes (Signed)
Anesthesia Regional Block: Adductor canal block   Pre-Anesthetic Checklist: ,, timeout performed, Correct Patient, Correct Site, Correct Laterality, Correct Procedure, Correct Position, site marked, Risks and benefits discussed,  Surgical consent,  Pre-op evaluation,  At surgeon's request and post-op pain management  Laterality: Left  Prep: Maximum Sterile Barrier Precautions used, chloraprep       Needles:  Injection technique: Single-shot  Needle Type: Echogenic Stimulator Needle     Needle Length: 9cm  Needle Gauge: 22     Additional Needles:   Procedures:,,,, ultrasound used (permanent image in chart),,,,  Narrative:  Start time: 04/05/2020 11:05 AM End time: 04/05/2020 11:10 AM Injection made incrementally with aspirations every 5 mL.  Performed by: Personally  Anesthesiologist: Pervis Hocking, DO  Additional Notes: Monitors applied. No increased pain on injection. No increased resistance to injection. Injection made in 5cc increments. Good needle visualization. Patient tolerated procedure well.

## 2020-04-05 NOTE — Progress Notes (Signed)
Pharmacy Antibiotic Note  David Fitzgerald is a 60 y.o. male admitted on 04/05/2020 with L ankle ulcer, open fx dislocation of L ankle, hardware failure.  Pt is S/P orthopedic surgery this afternoon. Pharmacy has been consulted for vancomycin dosing for wound infection.  Pt had cx of L ankle wound on 03/10/20 that grew Enterobacter cloacae. Spoke with Dr. Sharol Given - changed ceftriaxone to cefepime to cover recent Enterobacter cloacae cx.  WBC 10.6, afebrile; Scr 1.10, CrCl 116.8 ml/min (renal function stable, per recent values in Epic)  Plan: Vancomycin 2500 mg IV X 1, followed by vancomycin 1500 mg IV Q 12 hrs (estimated vancomycin AUC on this regimen, using Scr 1.1, is 494.2; goal vancomycin AUC is 400-550 Cefepime 2 gm IV Q 8 hrs Monitor WBC, temp, clinical improvement, renal function, vancomycin levels  Height: 6\' 2"  (188 cm) Weight: (!) 165.6 kg (365 lb) IBW/kg (Calculated) : 82.2  Temp (24hrs), Avg:98.1 F (36.7 C), Min:97.7 F (36.5 C), Max:98.4 F (36.9 C)  Recent Labs  Lab 04/05/20 1032  CREATININE 1.10    Estimated Creatinine Clearance: 116.8 mL/min (by C-G formula based on SCr of 1.1 mg/dL).    Allergies  Allergen Reactions  . Penicillins Swelling and Other (See Comments)    CEPHALOSPORIN TOLERANT 01/31/2020- "Allergic," per paperwork from facility  . Aspirin Other (See Comments)    Relative contraindication due to hematochezia while on it- "Allergic," per paperwork from facility  . Bee Venom Other (See Comments)  . Fentanyl     Heart rate went up and down, BP went up and down    Antimicrobials this admission: 3/2 Clindamycin IV X 1 pre op 3/2 Cefepime  >> 3/2 Vancomycin >>  Microbiology results: 2/4 L ankle tissue: rare Enterobacter cloacae, suscept to cefepime 2/3 MRSA PCR: negative 3/1 COVID: negative  Thank you for allowing pharmacy to be a part of this patient's care.  Gillermina Hu, PharmD, BCPS, [facility] Clinical Pharmacist 04/05/2020 4:53 PM

## 2020-04-05 NOTE — Anesthesia Procedure Notes (Signed)
Procedure Name: LMA Insertion Date/Time: 04/05/2020 11:52 AM Performed by: Myna Bright, CRNA Pre-anesthesia Checklist: Patient identified, Emergency Drugs available, Suction available and Patient being monitored Patient Re-evaluated:Patient Re-evaluated prior to induction Oxygen Delivery Method: Circle system utilized Preoxygenation: Pre-oxygenation with 100% oxygen Induction Type: IV induction LMA: LMA inserted LMA Size: 5.0 Number of attempts: 1 Placement Confirmation: positive ETCO2 and breath sounds checked- equal and bilateral Tube secured with: Tape Dental Injury: Teeth and Oropharynx as per pre-operative assessment

## 2020-04-05 NOTE — Plan of Care (Signed)

## 2020-04-05 NOTE — Interval H&P Note (Signed)
History and Physical Interval Note:  04/05/2020 11:42 AM  David Fitzgerald  has presented today for surgery, with the diagnosis of Hardware Failure Left Ankle.  The various methods of treatment have been discussed with the patient and family. After consideration of risks, benefits and other options for treatment, the patient has consented to  Procedure(s): INTERNAL FIXATION LEFT TIBIA TO CALCANEUS AND APPLY SKIN GRAFT (Left) as a surgical intervention.  The patient's history has been reviewed, patient examined, no change in status, stable for surgery.  I have reviewed the patient's chart and labs.  Questions were answered to the patient's satisfaction.     [name]

## 2020-04-05 NOTE — Progress Notes (Signed)
Orthopedic Tech Progress Note Patient Details:  David Fitzgerald 1960/11/12 [id number] Patient already had cam walker ortho came to apply boot Patient ID: David Fitzgerald, male   DOB: February 20, 1960, 60 y.o.   MRN: [id number]   David Fitzgerald 04/05/2020, 2:51 PM

## 2020-04-05 NOTE — H&P (Signed)
David Fitzgerald is an 60 y.o. male.   Chief Complaint: Left ankle Dislocation ulcer left Ankle David Fitzgerald is a 60 year old gentleman who is seen in follow-up status post skin graft for open wound medial malleolus left ankle patient initially underwent open reduction internal fixation for a open fracture dislocation of the ankle with internal fixation of the syndesmosis and internal fixation of the [place] C fibular fracture.  Patient states he has increased deformity to the ankle and increased drainage from the skin graft.  Patient states that he has been nonweightbearing on the left foot.  Past Medical History:  Diagnosis Date  . A-fib ([place])   . Allergy to pollen 04/06/2015  . Anxiety 02/06/2017  . Arthritis 02/06/2017  . Asthma    as a child  . Bee sting allergy    wasp / mixed vespid  . Bell's palsy 02/24/2013  . Chronic venous insufficiency 12/14/2014  . Chronic, continuous use of opioids 02/15/2020  . Depression   . Diabetes ([place])   . Edema 12/14/2014  . GERD (gastroesophageal reflux disease)   . History of kidney stones   . Hyperlipidemia, mixed 12/14/2014  . Hypertension   . Lumbar pain with radiation down left leg 07/09/2012  . Morbid obesity ([place]) 12/14/2014  . Morbid obesity with BMI of 45.0-49.9, adult ([place]) 07/09/2012  . Obesity hypoventilation syndrome ([place]) 12/14/2014  . OSA (obstructive sleep apnea) 03/09/2020   doesn't use a Cpap  . OSA treated with BiPAP 12/14/2014  . Pain syndrome, chronic 07/09/2012  . Pernicious anemia 02/06/2017  . Pneumonia   . Polyneuropathy in diabetes ([place]) 02/24/2013  . Syndrome affecting cervical region 02/24/2013  . Trigeminal neuralgia 02/24/2013  . Type 2 diabetes mellitus with diabetic neuropathy ([place]) 12/14/2014  . Vitamin D deficiency 02/16/2020  . Wound dehiscence, traumatic injury repair, left medial ankle  03/08/2020    Past Surgical History:  Procedure Laterality Date  . ADENOIDECTOMY    . BACK SURGERY  2002 /2003  . CARPAL TUNNEL RELEASE Bilateral    . I & D EXTREMITY Left 01/31/2020   Procedure: IRRIGATION AND DEBRIDEMENT ANKLE;  Surgeon: Altamese [place], MD;  Location: [facility];  Service: Orthopedics;  Laterality: Left;  . I & D EXTREMITY Left 03/10/2020   Procedure: DEBRIDEMENT LEFT ANKLE, APPLY SKIN GRAFT;  Surgeon: [name], MD;  Location: [facility];  Service: Orthopedics;  Laterality: Left;  . KNEE SURGERY Left 2005  . ORIF ANKLE FRACTURE Left 01/31/2020  . ORIF ANKLE FRACTURE Left 01/31/2020   Procedure: OPEN REDUCTION INTERNAL FIXATION (ORIF) ANKLE FRACTURE;  Surgeon: Altamese [place], MD;  Location: [facility];  Service: Orthopedics;  Laterality: Left;  . SHOULDER SURGERY Left 1998  . TONSILLECTOMY      Family History  Problem Relation Age of Onset  . Breast cancer Mother   . Heart disease Father   . CAD Father   . Atrial fibrillation Father   . Stomach cancer Paternal Aunt   . Heart disease Paternal Uncle   . Breast cancer Maternal Grandmother    Social History:  reports that he quit smoking about 13 years ago. His smoking use included cigarettes. He has a 105.00 pack-year smoking history. He has never used smokeless tobacco. He reports that he does not drink alcohol and does not use drugs.  Allergies:  Allergies  Allergen Reactions  . Penicillins Swelling and Other (See Comments)    CEPHALOSPORIN TOLERANT 01/31/2020- "Allergic," per paperwork from facility  . Aspirin Other (See Comments)  Relative contraindication due to hematochezia while on it- "Allergic," per paperwork from facility  . Bee Venom Other (See Comments)  . Fentanyl     Heart rate went up and down, BP went up and down    No medications prior to admission.    Results for orders placed or performed during the hospital encounter of 04/04/20 (from the past 48 hour(s))  SARS CORONAVIRUS 2 (TAT 6-24 HRS) Nasopharyngeal Nasopharyngeal Swab     Status: None   Collection Time: 04/04/20 11:21 AM   Specimen: Nasopharyngeal Swab  Result Value Ref Range   SARS  Coronavirus 2 NEGATIVE NEGATIVE    Comment: (NOTE) SARS-CoV-2 target nucleic acids are NOT DETECTED.  The SARS-CoV-2 RNA is generally detectable in upper and lower respiratory specimens during the acute phase of infection. Negative results do not preclude SARS-CoV-2 infection, do not rule out co-infections with other pathogens, and should not be used as the sole basis for treatment or other patient management decisions. Negative results must be combined with clinical observations, patient history, and epidemiological information. The expected result is Negative.  Fact Sheet for Patients: SugarRoll.be  Fact Sheet for Healthcare Providers: https://www.woods-mathews.com/  This test is not yet approved or cleared by the [place] FDA and  has been authorized for detection and/or diagnosis of SARS-CoV-2 by FDA under an Emergency Use Authorization (EUA). This EUA will remain  in effect (meaning this test can be used) for the duration of the COVID-19 declaration under Se ction 564(b)(1) of the Act, 21 U.S.C. section 360bbb-3(b)(1), unless the authorization is terminated or revoked sooner.  Performed at [facility], [place] [address], [place], [place] [address]    XR Ankle Complete Left  Result Date: 04/03/2020 Three-view radiographs of the left ankle shows hardware failure of the fibular fracture and syndesmotic internal fixation.  There is dislocation of the ankle tibiotalar joint   Review of Systems  All other systems reviewed and are negative.   There were no vitals taken for this visit. Physical Exam  Patient is alert, oriented, no adenopathy, well-dressed, normal affect, normal respiratory effort. Examination patient has bleeding granulation tissue over the medial malleolar ulcer this was touched with silver nitrate he is on Eliquis for his atrial fibrillation.  Patient has progressive valgus deformity of the ankle and  radiographs shows a tibiotalar dislocation with failure of the hardware.  A Dynaflex wrap with silver cell is reapplied to help decrease the swelling and promote wound healing.  Patient was placed back in his fracture boot.Heart RRR Lungs Clear Assessment/Plan 1. Open fracture dislocation of left ankle   2. Pain from implanted hardware, initial encounter     Plan: With the hardware failure and dislocation of the tibiotalar joint with the large open wound medially will plan for internal fixation with reduction and placement of calcaneal pins from the calcaneus talus and tibial joint.  After wound healing patient would need to proceed with a complete fusion.  Do not feel the soft tissue envelope is stay for or stable at this time to proceed with a formalized fusion patient would be an increased risk for infection.  Plan for surgery on Wednesday as an outpatient discussed the importance of nonweightbearing to be continued.   Bevely Palmer Yalonda Sample, PA 04/05/2020, 5:42 AM

## 2020-04-05 NOTE — Progress Notes (Signed)
   04/05/20 2135  Assess: MEWS Score  Temp 97.6 F (36.4 C)  BP 99/64  Pulse Rate (!) 131  Resp 18  SpO2 93 %  O2 Device Nasal Cannula  O2 Flow Rate (L/min) 3 L/min  Assess: MEWS Score  MEWS Temp 0  MEWS Systolic 1  MEWS Pulse 3  MEWS RR 0  MEWS LOC 0  MEWS Score 4  MEWS Score Color Red  Assess: if the MEWS score is Yellow or Red  Were vital signs taken at a resting state? Yes  Focused Assessment No change from prior assessment  Early Detection of Sepsis Score *See Row Information* Low  MEWS guidelines implemented *See Row Information* Yes  Treat  MEWS Interventions Escalated (See documentation below)  Escalate  MEWS: Escalate Red: discuss with charge nurse/RN and provider, consider discussing with RRT  Notify: Charge Nurse/RN  Name of Charge Nurse/RN Notified Blanch Media, RN  Date Charge Nurse/RN Notified 04/05/20  Time Charge Nurse/RN Notified 2210  Notify: Provider  Provider Name/Title Dr. Sharol Given  Date Provider Notified 04/05/20  Time Provider Notified 2151 (answering service)  Notification Type Page  Notification Reason Other (Comment) (patient's HR is 131 - afib - medication needed)  Provider response See new orders  Date of Provider Response 04/05/20  Time of Provider Response 2219  Notify: Rapid Response  Name of Rapid Response RN Notified Mindy, RN  Date Rapid Response Notified 04/05/20  Time Rapid Response Notified 2211   Gave x1 PO order of metoprolol 25 mg per Dr. Sharol Given. Red MEWS protocol in place. Will continue to monitor.

## 2020-04-05 NOTE — Anesthesia Procedure Notes (Signed)
Anesthesia Regional Block: Popliteal block   Pre-Anesthetic Checklist: ,, timeout performed, Correct Patient, Correct Site, Correct Laterality, Correct Procedure, Correct Position, site marked, Risks and benefits discussed,  Surgical consent,  Pre-op evaluation,  At surgeon's request and post-op pain management  Laterality: Left  Prep: Maximum Sterile Barrier Precautions used, chloraprep       Needles:  Injection technique: Single-shot  Needle Type: Echogenic Stimulator Needle     Needle Length: 9cm  Needle Gauge: 22     Additional Needles:   Procedures:,,,, ultrasound used (permanent image in chart),,,,  Narrative:  Start time: 04/05/2020 11:00 AM End time: 04/05/2020 11:05 AM Injection made incrementally with aspirations every 5 mL.  Performed by: Personally  Anesthesiologist: Pervis Hocking, DO  Additional Notes: Monitors applied. No increased pain on injection. No increased resistance to injection. Injection made in 5cc increments. Good needle visualization. Patient tolerated procedure well.

## 2020-04-05 NOTE — Progress Notes (Signed)
Pt arrived to the unit. Vital signs stable. Continuous pulse ox set up. Pt is sleepy but easy to arouse. Pt states he has no pain and in no acute distress. Will continue to monitor. Family at bedside.

## 2020-04-05 NOTE — Anesthesia Postprocedure Evaluation (Signed)
Anesthesia Post Note  Patient: David Fitzgerald  Procedure(s) Performed: INTERNAL FIXATION LEFT TIBIA TO CALCANEUS AND APPLY SKIN GRAFT (Left Ankle)     Patient location during evaluation: PACU Anesthesia Type: General Level of consciousness: awake and alert and oriented Pain management: pain level controlled Vital Signs Assessment: post-procedure vital signs reviewed and stable Respiratory status: spontaneous breathing, nonlabored ventilation, respiratory function stable and patient connected to nasal cannula oxygen Cardiovascular status: blood pressure returned to baseline and stable Postop Assessment: no apparent nausea or vomiting Anesthetic complications: no   No complications documented.  Last Vitals:  Vitals:   04/05/20 1430 04/05/20 1435  BP: 106/73 116/79  Pulse: (!) 103 99  Resp: 14 13  Temp:    SpO2: 97% 94%    Last Pain:  Vitals:   04/05/20 1341  TempSrc:   PainSc: 0-No pain                 Saifullah Jolley A.

## 2020-04-05 NOTE — Op Note (Signed)
04/05/2020  1:43 PM  PATIENT:  David Fitzgerald    PRE-OPERATIVE DIAGNOSIS:  Hardware Failure Left Ankle  POST-OPERATIVE DIAGNOSIS:  Same  PROCEDURE:  INTERNAL FIXATION LEFT TIBIA TO CALCANEUS AND APPLY SKIN GRAFT  SURGEON:  [name], MD  PHYSICIAN ASSISTANT:None ANESTHESIA:   General  PREOPERATIVE INDICATIONS:  David Fitzgerald is a  60 y.o. male with a diagnosis of Hardware Failure Left Ankle who failed conservative measures and elected for surgical management.    The risks benefits and alternatives were discussed with the patient preoperatively including but not limited to the risks of infection, bleeding, nerve injury, cardiopulmonary complications, the need for revision surgery, among others, and the patient was willing to proceed.  OPERATIVE IMPLANTS: 170 cm x 10 mm in diameter Biomet tibial talar calcaneal fusion nail. Kerecis skin graft 7 x 5 cm. Cleanse choice wound VAC sponge and axial form wound VAC sponge.  @ENCIMAGES @  OPERATIVE FINDINGS: Extensive soft tissue injury from the open fracture dislocation of the left ankle medial malleolus with failure of internal fixation.  C-arm fluoroscopy verified tibial talar calcaneal fusion alignment intraoperatively and after placement of the nail.  OPERATIVE PROCEDURE: Patient was brought the operating room and underwent a general anesthetic.  After adequate levels anesthesia were obtained patient's left lower extremity was first prepped using ChloraPrep scrub and prep dried and then prepped using DuraPrep draped into a sterile field a timeout was called.  There is extensive soft tissue injury medially that was resected with a 21 blade knife this left a wound that was 10 x 5 cm.  The ankle was dislocated.  A oscillating saw was used to resect the tibia and talus perpendicular to the long axis of the tibia the ankle was attempted to be reduced and this was not reduced secondary to the failure of the hardware laterally.  A small incision  was made laterally and the distal plate and screws and the 2 syndesmotic screws were removed without complications this allowed for reduction of the tibial talar joint.  An incision was placed plantarly and guidewire was inserted from the calcaneus through the talus into the tibia see arthroscopy verified alignment and this was first overdrilled a ball-tipped guidewire was inserted and this was reamed to 11.5 mm for a 10 mm nail.  The 170 mm tibial talocalcaneal fusion nail was inserted and the distal locking screw was placed 75 mm.  The fusion site was then impacted in a proximal static screw was placed 28 mm C arthroscopy verified alignment.  The wounds were irrigated throughout the case with normal saline and they were irrigated again.  Local tissue rearrangement was used to close the medial incision over 7 x 5 cm Kerecis split thickness graft.  Local tissue rearrangement was used to close the wound 10 x 5 cm.  The plantar incision and posterior incision were closed with 2-0 nylon and the fibula incision was also closed with 2-0 nylon.  The cleanse choice reticulated foam was placed over the skin graft medially this was covered by the solid cleanse choice sponge and a axial form wound VAC was used to cover all incisions.  This was sealed with derma tack this had a good suction fit covered with Covan patient was extubated taken the PACU in stable condition   DISCHARGE PLANNING:  Antibiotic duration: Antibiotics for 72 hours  Weightbearing: Nonweightbearing on the left with a cam walker  Pain medication: Opioid pathway  Dressing care/ Wound VAC: Wound VAC for 1  week  Ambulatory devices: Walker and wheelchair  Discharge to: Anticipate discharge to home  Follow-up: In the office 1 week post operative.

## 2020-04-06 ENCOUNTER — Encounter (HOSPITAL_COMMUNITY): Payer: Self-pay | Admitting: Orthopedic Surgery

## 2020-04-06 LAB — HEMOGLOBIN AND HEMATOCRIT, BLOOD
HCT: 29.4 % — ABNORMAL LOW (ref 39.0–52.0)
Hemoglobin: 8.9 g/dL — ABNORMAL LOW (ref 13.0–17.0)

## 2020-04-06 MED ORDER — CIPROFLOXACIN HCL 500 MG PO TABS: 500.0000 mg | ORAL_TABLET | Freq: Two times a day (BID) | ORAL | 0 refills | Status: AC

## 2020-04-06 MED ORDER — OXYCODONE-ACETAMINOPHEN 5-325 MG PO TABS: 1.0000 | ORAL_TABLET | ORAL | 0 refills | Status: DC | PRN

## 2020-04-06 NOTE — Progress Notes (Signed)
Patient is postop day #1 status post irrigation debridement with stabilization of left ankle dislocation. Overnight had episode of A. fib. He has a history of this. Patient was given doses of metoprolol.   Wound VAC functioning quite well with 2 green checks. Has been 100 cc in the canister.   Discussed with patient will plan for discharge most likely tomorrow continue with IV antibiotics will discharge on oral antibiotic per recommendation infectious disease pharmacist if patient has recurrence of A. fib with high rate would consult internal medicine

## 2020-04-06 NOTE — Consult Note (Signed)
   [facility] Inpatient Consult   04/06/2020  David Fitzgerald 1960-04-08 [id number]  [place] Organization [ACO] Patient: [name] Medicare   Patient screened for readmission hospitalization and had been out reach by a [facility] Telephonic RN Care Coordinator. Patient had no needs at last out reach and the current surgery was planned.  Plan:  Patient will be followed by General Discharge EMMI and no new needs assessed at this time.  For questions contact:   Natividad Brood, RN BSN [facility] Liaison  [phone] business mobile phone Toll free office [phone]  Fax number: [phone] [email or web address]

## 2020-04-06 NOTE — Progress Notes (Signed)
   04/06/20 0930  Vitals  Temp 97.9 F (36.6 C)  Temp Source Oral  BP 97/80  BP Location Right Arm  BP Method Automatic  Patient Position (if appropriate) Sitting  Pulse Rate (!) 117  Resp 17  MEWS COLOR  MEWS Score Color Yellow  Oxygen Therapy  SpO2 91 %  Pain Assessment  Pain Scale 0-10  Pain Score 0  MEWS Score  MEWS Temp 0  MEWS Systolic 1  MEWS Pulse 2  MEWS RR 0  MEWS LOC 0  MEWS Score 3  The patient is sitting in a chair.  No distress noted.  The patient has a history of A Fib.  The patient will continue to be monitored closely.  At this time, the patient reports no complaints

## 2020-04-06 NOTE — Evaluation (Signed)
Physical Therapy Evaluation Patient Details Name: David Fitzgerald MRN: [id number] DOB: 15-Jun-1960 Today's Date: 04/06/2020   History of Present Illness  Pt is a 60 y.o. male who presents for follow-up s/p skin graft for open wound medial malleolus L ankle in which pt initially underwent ORIF of L ankle 02/01/20. Pt reporting increased ankle deformity and drainage from skin graft. S/p internal fixation L tibia to calcaneus with skin graft due to hardware failure 3/2 (NWB). PMH: PAF, DM2 with neuropathy, obesity, BPH, OSA, venous insufficiency, HTN, Bell's palsy, asthma.  Clinical Impression  Pt presents with condition above and deficits mentioned below, see PT Problem List. Prior to his ankle fx he was independent with all mobility without AD/AE but has recently been needing assistance for cooking and cleaning and has been mod I for all other mobility. Currently, pt displays endurance, coordination, and balance deficits that impact his activity tolerance, independence, and safety with functional mobility. He was able to perform bed mobility independently but needed min guard for safety with standing transfers and short hop-to gait distance of ~3 ft with a RW today. Pt does have a leg scooter at home that will greatly assist him with advancing his mobility. Once pt is permitted to perform ROM, strengthening, or weight bearing through his L leg he would greatly benefit from outpatient PT to maximize his safety and independence with functional mobility. Will continue to follow acutely.    Follow Up Recommendations Follow surgeon's recommendation for DC plan and follow-up therapies (Outpatient PT once pt permitted for ROM/strengthening/mobility on L leg)    Equipment Recommendations  None recommended by PT    Recommendations for Other Services       Precautions / Restrictions Precautions Precautions: Fall Precaution Comments: NWB L LE; CAM boot L LE Restrictions Weight Bearing Restrictions: Yes LLE  Weight Bearing: Non weight bearing      Mobility  Bed Mobility Overal bed mobility: Independent             General bed mobility comments: Pt able to transition supine > sit EOB with bed flat and no use of rails with extra time to manage L leg, but otherwise safe.    Transfers Overall transfer level: Needs assistance Equipment used: Rolling walker (2 wheeled) Transfers: Sit to/from [facility] Sit to Stand: Min guard Stand pivot transfers: Min guard       General transfer comment: Sit to stand from EOB > RW with bed at simulated same height as bed at home, no LOB min guard for safety. Pt hopping to R to transfer with RW from bed > chair with min guard for safety and no LOB.  Ambulation/Gait Ambulation/Gait assistance: Min guard Gait Distance (Feet): 3 Feet Assistive device: Rolling walker (2 wheeled) Gait Pattern/deviations:  (hop-to) Gait velocity: reduced Gait velocity interpretation: <1.31 ft/sec, indicative of household ambulator General Gait Details: Hop-to on R leg to transfer with RW to R to chair from bed with min guard for safety, but no LOB. Pt following precautions of NWB safely.  Stairs            Wheelchair Mobility    Modified Rankin (Stroke Patients Only)       Balance Overall balance assessment: Needs assistance Sitting-balance support: No upper extremity supported;Feet supported Sitting balance-[name]: Good     Standing balance support: Bilateral upper extremity supported;During functional activity Standing balance-[name]: Poor Standing balance comment: Reliant on Bil UEs for support on RW.  Pertinent Vitals/Pain Pain Assessment: No/denies pain    Home Living Family/patient expects to be discharged to:: Private residence Living Arrangements: Alone Available Help at Discharge: Family;Available 24 hours/day Type of Home: House Home Access: Ramped entrance     Home  Layout: One level Home Equipment: Walker - 2 wheels;Walker - 4 wheels;Hand held shower head;Other (comment);Bedside commode;Wheelchair - manual;Cane - quad;Cane - single point (manual scooter for leg)      Prior Function Level of Independence: Independent         Comments: Prior to initial fx pt was independent with all functional mobility without use of AD/AE. Recently, fami,ly members cook and clean for pt but otherwise he is mod I with AD/AE, taking bird baths.     Hand Dominance   Dominant Hand: Right    Extremity/Trunk Assessment   Upper Extremity Assessment Upper Extremity Assessment: Overall WFL for tasks assessed    Lower Extremity Assessment Lower Extremity Assessment: LLE deficits/detail LLE Deficits / Details: L foot/ankle in CAM boot with noted edema, likely decreased ROM and strength, otherwise WFL for bil legs; hx of peripheral neuropathy in bil feet LLE Sensation: history of peripheral neuropathy    Cervical / Trunk Assessment Cervical / Trunk Assessment: Normal  Communication   Communication: No difficulties  Cognition Arousal/Alertness: Awake/alert Behavior During Therapy: WFL for tasks assessed/performed Overall Cognitive Status: Within Functional Limits for tasks assessed                                 General Comments: A&Ox4. Appropriate expressions and understanding of precautions and safety throughout.      General Comments General comments (skin integrity, edema, etc.): SpO2 >/= 92% on RA throughout session; HR up to 141 at times, cuing to rest with it decreasing to 110-130s (pt reports hx of a-fib and this being his norm)    Exercises     Assessment/Plan    PT Assessment Patient needs continued PT services  PT Problem List Decreased strength;Decreased range of motion;Decreased activity tolerance;Decreased balance;Decreased mobility;Decreased coordination;Impaired sensation;Obesity       PT Treatment Interventions DME  instruction;Gait training;Functional mobility training;Therapeutic activities;Therapeutic exercise;Balance training;Neuromuscular re-education;Patient/family education    PT Goals (Current goals can be found in the Care Plan section)  Acute Rehab PT Goals Patient Stated Goal: to go home PT Goal Formulation: With patient Time For Goal Achievement: 04/13/20 Potential to Achieve Goals: Good    Frequency Min 5X/week   Barriers to discharge        Co-evaluation               AM-PAC PT "6 Clicks" Mobility  Outcome Measure Help needed turning from your back to your side while in a flat bed without using bedrails?: None Help needed moving from lying on your back to sitting on the side of a flat bed without using bedrails?: None Help needed moving to and from a bed to a chair (including a wheelchair)?: A Little Help needed standing up from a chair using your arms (e.g., wheelchair or bedside chair)?: A Little Help needed to walk in hospital room?: A Little Help needed climbing 3-5 steps with a railing? : A Lot 6 Click Score: 19    End of Session Equipment Utilized During Treatment: Gait belt Activity Tolerance: Patient tolerated treatment well Patient left: in chair;with call bell/phone within reach;with chair alarm set Nurse Communication: Mobility status;Other (comment) (SpO2 levels) PT Visit Diagnosis: Unsteadiness on  feet (R26.81);Other abnormalities of gait and mobility (R26.89);Difficulty in walking, not elsewhere classified (R26.2)    Time: 0964-3838 PT Time Calculation (min) (ACUTE ONLY): 32 min   Charges:   PT Evaluation $PT Eval Moderate Complexity: 1 Mod PT Treatments $Therapeutic Activity: 8-22 mins        Moishe Spice, PT, DPT Acute Rehabilitation Services  Pager: [phone] Office: [place] 04/06/2020, 9:06 AM

## 2020-04-06 NOTE — Evaluation (Signed)
Occupational Therapy Evaluation Patient Details Name: David Fitzgerald MRN: [id number] DOB: 1961-01-19 Today's Date: 04/06/2020    History of Present Illness Pt is a 60 y.o. male who presents for follow-up s/p skin graft for open wound medial malleolus L ankle in which pt initially underwent ORIF of L ankle 02/01/20. Pt reporting increased ankle deformity and drainage from skin graft. S/p internal fixation L tibia to calcaneus with skin graft due to hardware failure 3/2 (NWB). PMH: PAF, DM2 with neuropathy, obesity, BPH, OSA, venous insufficiency, HTN, Bell's palsy, asthma.   Clinical Impression   Pt presents with decline in function and safety with ADLs and ADL mobility with impaired balance and endurance. Pt is NWB L LE and is able to maintain during mobility tasks. Prior to ankle fx pt was Ind with all ADLs/selfcare and mobility with no ADs or A/E. Pt currently requires mod A with Lb selfcare and min guard A with mobility suing RW. Pt would benefit from acute OT services to address impairments to maximize level of function and safety   Follow Up Recommendations  Follow surgeon's recommendation for DC plan and follow-up therapies;No OT follow up    Equipment Recommendations  None recommended by OT    Recommendations for Other Services       Precautions / Restrictions Precautions Precautions: Fall Precaution Comments: NWB L LE; CAM boot L LE Restrictions Weight Bearing Restrictions: Yes LLE Weight Bearing: Non weight bearing      Mobility Bed Mobility               General bed mobility comments: pt in recliner upon arrival    Transfers Overall transfer level: Needs assistance Equipment used: Rolling walker (2 wheeled) Transfers: Sit to/from [facility] Sit to Stand: Min guard Stand pivot transfers: Min guard            Balance Overall balance assessment: Needs assistance Sitting-balance support: No upper extremity supported;Feet supported Sitting  balance-[name]: Good     Standing balance support: Bilateral upper extremity supported;During functional activity Standing balance-[name]: Poor                             ADL either performed or assessed with clinical judgement   ADL Overall ADL's : Needs assistance/impaired Eating/Feeding: Independent;Sitting   Grooming: Wash/dry hands;Wash/dry face;Min guard;Standing   Upper Body Bathing: Set up;Independent;Sitting   Lower Body Bathing: Moderate assistance;Sitting/lateral leans   Upper Body Dressing : Set up;Independent;Sitting   Lower Body Dressing: Moderate assistance;Sitting/lateral leans   Toilet Transfer: Min guard;Ambulation;RW   Toileting- Water quality scientist and Hygiene: Min guard;Sit to/from stand       Functional mobility during ADLs: Min guard;Rolling walker General ADL Comments: pt has ADL A/E for LB at home and is famiiar with how to use     Vision Patient Visual Report: No change from baseline       Perception     Praxis      Pertinent Vitals/Pain Pain Assessment: No/denies pain     Hand Dominance Right   Extremity/Trunk Assessment Upper Extremity Assessment Upper Extremity Assessment: Overall WFL for tasks assessed   Lower Extremity Assessment Lower Extremity Assessment: Defer to PT evaluation       Communication Communication Communication: No difficulties   Cognition Arousal/Alertness: Awake/alert Behavior During Therapy: WFL for tasks assessed/performed Overall Cognitive Status: Within Functional Limits for tasks assessed  General Comments       Exercises     Shoulder Instructions      Home Living Family/patient expects to be discharged to:: Private residence Living Arrangements: Alone Available Help at Discharge: Family;Available 24 hours/day Type of Home: House Home Access: Ramped entrance     Home Layout: One level     Bathroom Shower/Tub:  Tub/shower unit;Curtain   Biochemist, clinical: Standard Bathroom Accessibility: Yes How Accessible: Accessible via walker Home Equipment: Scenic - 2 wheels;Walker - 4 wheels;Hand held shower head;Other (comment);Bedside commode;Wheelchair - manual;Cane - quad;Cane - single point   Additional Comments: wheelchair and BSC from [employer].      Prior Functioning/Environment          Comments: Pt reports that prior to initial fx pt was independent with all ADLs/selfcare and functional mobility without use of AD/AE. Recently, family members cook and clean for pt but otherwise he is mod I with AD/AE, taking bird baths at sink        OT Problem List: Impaired balance (sitting and/or standing);Decreased activity tolerance;Pain;Obesity      OT Treatment/Interventions:      OT Goals(Current goals can be found in the care plan section) Acute Rehab OT Goals Patient Stated Goal: to go home OT Goal Formulation: With patient Time For Goal Achievement: 04/20/20 Potential to Achieve Goals: Good ADL Goals Pt Will Perform Grooming: with supervision;with set-up;with modified independence;standing Pt Will Perform Lower Body Bathing: with min assist;with min guard assist;with supervision;sitting/lateral leans;sit to/from stand;with adaptive equipment Pt Will Perform Lower Body Dressing: with min assist;with min guard assist;with supervision;sitting/lateral leans;sit to/from stand;with adaptive equipment Pt Will Transfer to Toilet: with supervision;with modified independence;ambulating Pt Will Perform Toileting - Clothing Manipulation and hygiene: with supervision;with modified independence;sit to/from stand  OT Frequency: Min 2X/week   Barriers to D/C:            Co-evaluation              AM-PAC OT "6 Clicks" Daily Activity     Outcome Measure Help from another person eating meals?: None Help from another person taking care of personal grooming?: A Little Help from another person  toileting, which includes using toliet, bedpan, or urinal?: A Little Help from another person bathing (including washing, rinsing, drying)?: A Lot Help from another person to put on and taking off regular upper body clothing?: None Help from another person to put on and taking off regular lower body clothing?: A Lot 6 Click Score: 18   End of Session Equipment Utilized During Treatment: Rolling walker  Activity Tolerance: Patient tolerated treatment well Patient left: in chair;with call bell/phone within reach  OT Visit Diagnosis: Unsteadiness on feet (R26.81);Other abnormalities of gait and mobility (R26.89);Pain;History of falling (Z91.81) Pain - Right/Left: Left Pain - part of body: Ankle and joints of foot                Time: 1829-9371 OT Time Calculation (min): 30 min Charges:  OT General Charges $OT Visit: 1 Visit OT Evaluation $OT Eval Moderate Complexity: 1 Mod OT Treatments $Self Care/Home Management : 8-22 mins    [name] 04/06/2020, 12:50 PM

## 2020-04-06 NOTE — Discharge Summary (Signed)
Discharge Diagnoses:  Active Problems:   Open dislocation of left ankle   Left ankle pain   Surgeries: Procedure(s): INTERNAL FIXATION LEFT TIBIA TO CALCANEUS AND APPLY SKIN GRAFT on 04/05/2020    Consultants:   Discharged Condition: Improved  Hospital Course: David Fitzgerald is an 60 y.o. male who was admitted 04/05/2020 with a chief complaint of left ankle dislocation with a final diagnosis of Hardware Failure Left Ankle.  Patient was brought to the operating room on 04/05/2020 and underwent Procedure(s): INTERNAL FIXATION LEFT TIBIA TO CALCANEUS AND APPLY SKIN GRAFT.    Patient was given perioperative antibiotics:  Anti-infectives (From admission, onward)   Start     Dose/Rate Route Frequency Ordered Stop   04/06/20 0600  vancomycin (VANCOREADY) IVPB 1500 mg/300 mL        1,500 mg 150 mL/hr over 120 Minutes Intravenous Every 12 hours 04/05/20 1711     04/06/20 0000  ciprofloxacin (CIPRO) 500 MG tablet        500 mg Oral 2 times daily 04/06/20 0807 04/16/20 2359   04/05/20 1800  vancomycin (VANCOCIN) 2,500 mg in sodium chloride 0.9 % 500 mL IVPB        2,500 mg 250 mL/hr over 120 Minutes Intravenous  Once 04/05/20 1711 04/05/20 2023   04/05/20 1730  ceFEPIme (MAXIPIME) 2 g in sodium chloride 0.9 % 100 mL IVPB        2 g 200 mL/hr over 30 Minutes Intravenous Every 8 hours 04/05/20 1711     04/05/20 1530  cefTRIAXone (ROCEPHIN) 2 g in sodium chloride 0.9 % 100 mL IVPB  Status:  Discontinued        2 g 200 mL/hr over 30 Minutes Intravenous Every 24 hours 04/05/20 1521 04/05/20 1711   04/05/20 1015  clindamycin (CLEOCIN) IVPB 900 mg        900 mg 100 mL/hr over 30 Minutes Intravenous On call to O.R. 04/05/20 1006 04/05/20 1155   04/05/20 1015  clindamycin (CLEOCIN) 900 MG/50ML IVPB       Note to Pharmacy: Elyse Jarvis, Tammy   : cabinet override      04/05/20 1015 04/05/20 1156    .  Patient was given sequential compression devices, early ambulation, and aspirin for DVT  prophylaxis.  Recent vital signs:  Patient Vitals for the past 24 hrs:  BP Temp Temp src Pulse Resp SpO2 Height Weight  04/06/20 0615 (!) 100/48 - - 77 20 (!) 86 % - -  04/06/20 0152 107/73 - - 91 20 94 % - -  04/06/20 0111 120/87 - - 94 20 92 % - -  04/05/20 2347 100/62 - - (!) 130 18 97 % - -  04/05/20 2258 111/81 - - 87 20 95 % - -  04/05/20 2135 99/64 97.6 F (36.4 C) Oral (!) 131 18 93 % - -  04/05/20 1811 124/62 97.6 F (36.4 C) Oral 86 17 93 % - -  04/05/20 1648 112/78 97.7 F (36.5 C) Oral 90 18 96 % - -  04/05/20 1625 98/62 98.2 F (36.8 C) - (!) 122 12 96 % - -  04/05/20 1555 (!) 99/54 - - (!) 104 14 98 % - -  04/05/20 1530 95/72 - - (!) 119 12 95 % - -  04/05/20 1515 107/65 - - (!) 109 17 96 % - -  04/05/20 1510 107/78 - - 64 11 96 % - -  04/05/20 1450 107/62 - - (!) 124 17 94 % - -  04/05/20 1435 116/79 - - 99 13 94 % - -  04/05/20 1430 106/73 - - (!) 103 14 97 % - -  04/05/20 1425 104/73 - - (!) 117 15 94 % - -  04/05/20 1420 102/67 - - (!) 104 13 95 % - -  04/05/20 1415 95/63 - - (!) 103 14 93 % - -  04/05/20 1410 94/64 - - (!) 103 13 93 % - -  04/05/20 1406 (!) 102/53 - - (!) 115 15 93 % - -  04/05/20 1401 109/60 - - (!) 103 15 95 % - -  04/05/20 1355 105/77 - - (!) 114 14 96 % - -  04/05/20 1350 109/85 - - - 13 92 % - -  04/05/20 1349 - - - - - 93 % - -  04/05/20 1348 98/73 - - (!) 115 13 92 % - -  04/05/20 1341 98/73 98.4 F (36.9 C) - - 11 93 % - -  04/05/20 1115 133/83 - - (!) 48 15 95 % - -  04/05/20 1037 (!) 102/48 98.1 F (36.7 C) Oral 60 18 93 % _0  (1.88 m) (!) 165.6 kg  .  Recent laboratory studies: No results found.  Discharge Medications:   Allergies as of 04/06/2020      Reactions   Penicillins Swelling, Other (See Comments)   CEPHALOSPORIN TOLERANT 01/31/2020- "Allergic," per paperwork from facility   Aspirin Other (See Comments)   Relative contraindication due to hematochezia while on it- "Allergic," per paperwork from facility   Bee  Venom Other (See Comments)   Fentanyl    Heart rate went up and down, BP went up and down      Medication List    STOP taking these medications   oxyCODONE-acetaminophen 7.5-325 MG tablet Commonly known as: PERCOCET Replaced by: oxyCODONE-acetaminophen 5-325 MG tablet   sulfamethoxazole-trimethoprim 800-160 MG tablet Commonly known as: Bactrim DS     TAKE these medications   ascorbic acid 1000 MG tablet Commonly known as: VITAMIN C Take 1 tablet (1,000 mg total) by mouth daily.   Azelastine-Fluticasone 137-50 MCG/ACT Susp Place 2 sprays into both nostrils daily as needed (Allergies).   ciprofloxacin 500 MG tablet Commonly known as: Cipro Take 1 tablet (500 mg total) by mouth 2 (two) times daily for 10 days.   cyanocobalamin 1000 MCG tablet Take 1 tablet (1,000 mcg total) by mouth daily.   dicyclomine 20 MG tablet Commonly known as: BENTYL Take 20 mg by mouth 2 (two) times daily.   docusate sodium 100 MG capsule Commonly known as: COLACE Take 1 capsule (100 mg total) by mouth 2 (two) times daily. What changed:   when to take this  reasons to take this   DULoxetine 60 MG capsule Commonly known as: CYMBALTA Take 60 mg by mouth daily.   Eliquis 5 MG Tabs tablet Generic drug: apixaban TAKE 1 TABLET BY MOUTH TWICE DAILY What changed: how much to take   EPINEPHrine 0.3 mg/0.3 mL Soaj injection Commonly known as: EPI-PEN Use for life-threatening allergic reactions What changed:   how much to take  how to take this  when to take this  reasons to take this  additional instructions   furosemide 40 MG tablet Commonly known as: LASIX Take 40 mg by mouth daily as needed for fluid or edema.   gabapentin 300 MG capsule Commonly known as: NEURONTIN Take 600 mg by mouth 3 (three) times daily.   metFORMIN 500 MG tablet Commonly known  as: GLUCOPHAGE Take 500 mg by mouth daily with breakfast.   metoprolol tartrate 25 MG tablet Commonly known as:  LOPRESSOR Take 1 tablet (25 mg total) by mouth at bedtime. What changed:   how much to take  additional instructions   metoprolol tartrate 50 MG tablet Commonly known as: LOPRESSOR Take 1 tablet (50 mg total) by mouth daily. What changed: Another medication with the same name was changed. Make sure you understand how and when to take each.   naloxone 4 MG/0.1ML Liqd nasal spray kit Commonly known as: NARCAN Place 1 spray into the nose See admin instructions. OPIOID OVERDOSE EMERGENCY   naproxen sodium 220 MG tablet Commonly known as: ALEVE Take 440 mg by mouth daily as needed (for headaches).   nitroGLYCERIN 0.4 MG SL tablet Commonly known as: NITROSTAT Place 0.4 mg under the tongue every 5 (five) minutes as needed for chest pain.   ondansetron 4 MG disintegrating tablet Commonly known as: ZOFRAN-ODT Take 4 mg by mouth every 8 (eight) hours as needed for nausea or vomiting.   oxyCODONE-acetaminophen 5-325 MG tablet Commonly known as: Percocet Take 1 tablet by mouth every 4 (four) hours as needed. Replaces: oxyCODONE-acetaminophen 7.5-325 MG tablet   pantoprazole 40 MG tablet Commonly known as: PROTONIX Take 40 mg by mouth daily.   simvastatin 20 MG tablet Commonly known as: ZOCOR Take 20 mg by mouth at bedtime.   traMADol 50 MG tablet Commonly known as: ULTRAM Take 1 tablet (50 mg total) by mouth every 6 (six) hours as needed for moderate pain or severe pain.   Vitamin D 125 MCG (5000 UT) Caps Take 1 capsule by mouth daily. What changed: how much to take       Diagnostic Studies: XR Ankle Complete Left  Result Date: 04/03/2020 Three-view radiographs of the left ankle shows hardware failure of the fibular fracture and syndesmotic internal fixation.  There is dislocation of the ankle tibiotalar joint   Patient benefited maximally from their hospital stay and there were no complications.     Disposition: Discharge disposition: 01-Home or Self  Care      Discharge Instructions    Call MD / Call 911   Complete by: As directed    If you experience chest pain or shortness of breath, CALL 911 and be transported to the hospital emergency room.  If you develope a fever above 101 F, pus (white drainage) or increased drainage or redness at the wound, or calf pain, call your surgeon's office.   Constipation Prevention   Complete by: As directed    Drink plenty of fluids.  Prune juice may be helpful.  You may use a stool softener, such as Colace (over the counter) 100 mg twice a day.  Use MiraLax (over the counter) for constipation as needed.   Diet - low sodium heart healthy   Complete by: As directed    Discharge instructions   Complete by: As directed    Show patient how to attach prevena vac. Should call office if alarms   Increase activity slowly as tolerated   Complete by: As directed    Negative Pressure Wound Therapy - Incisional   Complete by: As directed       Follow-up Information    Persons, Bevely Palmer, PA In 1 week.   Specialty: Orthopedic Surgery Contact information: [address] [place] [address] [phone]                Signed: Bevely Palmer Persons 04/06/2020,  8:08 AM

## 2020-04-06 NOTE — Plan of Care (Signed)
Patient is s/p left tibia internal fixation with skin graft by Dr. Sharol Given. See progress note regarding MEWS protocol. VSS at this time. [facility] in place and working WNL. Patient is in NAD. Will continue to monitor and continue current POC.

## 2020-04-06 NOTE — Plan of Care (Signed)
No acute changes since the previous night I took care of him. Heart rate more controlled and stable. Plan for discharge home tomorrow. NAD or needs voiced. Will continue to monitor and continue current POC.

## 2020-04-07 NOTE — Discharge Summary (Signed)
Discharge Diagnoses:  Active Problems:   Open dislocation of left ankle   Left ankle pain   Surgeries: Procedure(s): INTERNAL FIXATION LEFT TIBIA TO CALCANEUS AND APPLY SKIN GRAFT on 04/05/2020    Consultants:   Discharged Condition: Improved  Hospital Course: David Fitzgerald is an 60 y.o. male who was admitted 04/05/2020 with a chief complaint of left ankle dislocation status post open reduction internal fixation, with a final diagnosis of Hardware Failure Left Ankle.  Patient was brought to the operating room on 04/05/2020 and underwent Procedure(s): INTERNAL FIXATION LEFT TIBIA TO CALCANEUS AND APPLY SKIN GRAFT.    Patient was given perioperative antibiotics:  Anti-infectives (From admission, onward)   Start     Dose/Rate Route Frequency Ordered Stop   04/06/20 0600  vancomycin (VANCOREADY) IVPB 1500 mg/300 mL        1,500 mg 150 mL/hr over 120 Minutes Intravenous Every 12 hours 04/05/20 1711     04/06/20 0000  ciprofloxacin (CIPRO) 500 MG tablet        500 mg Oral 2 times daily 04/06/20 0807 04/16/20 2359   04/05/20 1800  vancomycin (VANCOCIN) 2,500 mg in sodium chloride 0.9 % 500 mL IVPB        2,500 mg 250 mL/hr over 120 Minutes Intravenous  Once 04/05/20 1711 04/05/20 2023   04/05/20 1730  ceFEPIme (MAXIPIME) 2 g in sodium chloride 0.9 % 100 mL IVPB        2 g 200 mL/hr over 30 Minutes Intravenous Every 8 hours 04/05/20 1711     04/05/20 1530  cefTRIAXone (ROCEPHIN) 2 g in sodium chloride 0.9 % 100 mL IVPB  Status:  Discontinued        2 g 200 mL/hr over 30 Minutes Intravenous Every 24 hours 04/05/20 1521 04/05/20 1711   04/05/20 1015  clindamycin (CLEOCIN) IVPB 900 mg        900 mg 100 mL/hr over 30 Minutes Intravenous On call to O.R. 04/05/20 1006 04/05/20 1155   04/05/20 1015  clindamycin (CLEOCIN) 900 MG/50ML IVPB       Note to Pharmacy: Elyse Jarvis, Tammy   : cabinet override      04/05/20 1015 04/05/20 1156    .  Patient was given sequential compression devices, early  ambulation, and aspirin for DVT prophylaxis.  Recent vital signs:  Patient Vitals for the past 24 hrs:  BP Temp Temp src Pulse Resp SpO2  04/07/20 0812 103/70 97.8 F (36.6 C) Oral 100 18 95 %  04/06/20 2000 111/69 98 F (36.7 C) Oral 80 18 97 %  04/06/20 1742 109/68 98.2 F (36.8 C) Oral 100 17 94 %  04/06/20 1300 91/64 98.2 F (36.8 C) Oral 80 17 97 %  04/06/20 1019 104/65 97.9 F (36.6 C) Oral 88 18 100 %  .  Recent laboratory studies: No results found.  Discharge Medications:   Allergies as of 04/07/2020      Reactions   Penicillins Swelling, Other (See Comments)   CEPHALOSPORIN TOLERANT 01/31/2020- "Allergic," per paperwork from facility   Aspirin Other (See Comments)   Relative contraindication due to hematochezia while on it- "Allergic," per paperwork from facility   Bee Venom Other (See Comments)   Fentanyl    Heart rate went up and down, BP went up and down      Medication List    STOP taking these medications   oxyCODONE-acetaminophen 7.5-325 MG tablet Commonly known as: PERCOCET Replaced by: oxyCODONE-acetaminophen 5-325 MG tablet   sulfamethoxazole-trimethoprim 800-160  MG tablet Commonly known as: Bactrim DS     TAKE these medications   ascorbic acid 1000 MG tablet Commonly known as: VITAMIN C Take 1 tablet (1,000 mg total) by mouth daily.   Azelastine-Fluticasone 137-50 MCG/ACT Susp Place 2 sprays into both nostrils daily as needed (Allergies).   ciprofloxacin 500 MG tablet Commonly known as: Cipro Take 1 tablet (500 mg total) by mouth 2 (two) times daily for 10 days.   cyanocobalamin 1000 MCG tablet Take 1 tablet (1,000 mcg total) by mouth daily.   dicyclomine 20 MG tablet Commonly known as: BENTYL Take 20 mg by mouth 2 (two) times daily.   docusate sodium 100 MG capsule Commonly known as: COLACE Take 1 capsule (100 mg total) by mouth 2 (two) times daily. What changed:   when to take this  reasons to take this   DULoxetine 60 MG  capsule Commonly known as: CYMBALTA Take 60 mg by mouth daily.   Eliquis 5 MG Tabs tablet Generic drug: apixaban TAKE 1 TABLET BY MOUTH TWICE DAILY What changed: how much to take   EPINEPHrine 0.3 mg/0.3 mL Soaj injection Commonly known as: EPI-PEN Use for life-threatening allergic reactions What changed:   how much to take  how to take this  when to take this  reasons to take this  additional instructions   furosemide 40 MG tablet Commonly known as: LASIX Take 40 mg by mouth daily as needed for fluid or edema.   gabapentin 300 MG capsule Commonly known as: NEURONTIN Take 600 mg by mouth 3 (three) times daily.   metFORMIN 500 MG tablet Commonly known as: GLUCOPHAGE Take 500 mg by mouth daily with breakfast.   metoprolol tartrate 25 MG tablet Commonly known as: LOPRESSOR Take 1 tablet (25 mg total) by mouth at bedtime. What changed:   how much to take  additional instructions   metoprolol tartrate 50 MG tablet Commonly known as: LOPRESSOR Take 1 tablet (50 mg total) by mouth daily. What changed: Another medication with the same name was changed. Make sure you understand how and when to take each.   naloxone 4 MG/0.1ML Liqd nasal spray kit Commonly known as: NARCAN Place 1 spray into the nose See admin instructions. OPIOID OVERDOSE EMERGENCY   naproxen sodium 220 MG tablet Commonly known as: ALEVE Take 440 mg by mouth daily as needed (for headaches).   nitroGLYCERIN 0.4 MG SL tablet Commonly known as: NITROSTAT Place 0.4 mg under the tongue every 5 (five) minutes as needed for chest pain.   ondansetron 4 MG disintegrating tablet Commonly known as: ZOFRAN-ODT Take 4 mg by mouth every 8 (eight) hours as needed for nausea or vomiting.   oxyCODONE-acetaminophen 5-325 MG tablet Commonly known as: Percocet Take 1 tablet by mouth every 4 (four) hours as needed. Replaces: oxyCODONE-acetaminophen 7.5-325 MG tablet   pantoprazole 40 MG tablet Commonly known  as: PROTONIX Take 40 mg by mouth daily.   simvastatin 20 MG tablet Commonly known as: ZOCOR Take 20 mg by mouth at bedtime.   traMADol 50 MG tablet Commonly known as: ULTRAM Take 1 tablet (50 mg total) by mouth every 6 (six) hours as needed for moderate pain or severe pain.   Vitamin D 125 MCG (5000 UT) Caps Take 1 capsule by mouth daily. What changed: how much to take       Diagnostic Studies: XR Ankle Complete Left  Result Date: 04/03/2020 Three-view radiographs of the left ankle shows hardware failure of the fibular fracture and syndesmotic  internal fixation.  There is dislocation of the ankle tibiotalar joint   Patient benefited maximally from their hospital stay and there were no complications.     Disposition: Discharge disposition: 01-Home or Self Care      Discharge Instructions    Call MD / Call 911   Complete by: As directed    If you experience chest pain or shortness of breath, CALL 911 and be transported to the hospital emergency room.  If you develope a fever above 101 F, pus (white drainage) or increased drainage or redness at the wound, or calf pain, call your surgeon's office.   Call MD / Call 911   Complete by: As directed    If you experience chest pain or shortness of breath, CALL 911 and be transported to the hospital emergency room.  If you develope a fever above 101 F, pus (white drainage) or increased drainage or redness at the wound, or calf pain, call your surgeon's office.   Constipation Prevention   Complete by: As directed    Drink plenty of fluids.  Prune juice may be helpful.  You may use a stool softener, such as Colace (over the counter) 100 mg twice a day.  Use MiraLax (over the counter) for constipation as needed.   Constipation Prevention   Complete by: As directed    Drink plenty of fluids.  Prune juice may be helpful.  You may use a stool softener, such as Colace (over the counter) 100 mg twice a day.  Use MiraLax (over the counter)  for constipation as needed.   Diet - low sodium heart healthy   Complete by: As directed    Diet - low sodium heart healthy   Complete by: As directed    Discharge instructions   Complete by: As directed    Show patient how to attach prevena vac. Should call office if alarms   Increase activity slowly as tolerated   Complete by: As directed    Increase activity slowly as tolerated   Complete by: As directed    Negative Pressure Wound Therapy - Incisional   Complete by: As directed       Follow-up Information    Persons, Bevely Palmer, PA In 1 week.   Specialty: Orthopedic Surgery Contact information: [address] [place] [address] [phone]                Signed: [name] 04/07/2020, 10:18 AM

## 2020-04-07 NOTE — Progress Notes (Signed)
Occupational Therapy Treatment Patient Details Name: David Fitzgerald MRN: [id number] DOB: [birth year] Today's Date: 04/07/2020    History of present illness Pt is a 60 y.o. male who presents for follow-up s/p skin graft for open wound medial malleolus L ankle in which pt initially underwent ORIF of L ankle 02/01/20. Pt reporting increased ankle deformity and drainage from skin graft. S/p internal fixation L tibia to calcaneus with skin graft due to hardware failure 3/2 (NWB). PMH: PAF, DM2 with neuropathy, obesity, BPH, OSA, venous insufficiency, HTN, Bell's palsy, asthma.   OT comments  Pt making good progress with functional goals. Pt to d/c home today with 24/7 assist from family. Pt has all necessary DME and A/E at home  Follow Up Recommendations  Follow surgeon's recommendation for DC plan and follow-up therapies;No OT follow up    Equipment Recommendations  None recommended by OT    Recommendations for Other Services      Precautions / Restrictions Precautions Precautions: Fall Precaution Comments: NWB L LE; CAM boot L LE Restrictions Weight Bearing Restrictions: Yes LLE Weight Bearing: Non weight bearing       Mobility Bed Mobility Overal bed mobility: Independent             General bed mobility comments: pt sitting EOB upon arrival    Transfers Overall transfer level: Needs assistance Equipment used: Rolling walker (2 wheeled) Transfers: Sit to/from [facility] Sit to Stand: Min guard;Supervision Stand pivot transfers: Supervision            Balance Overall balance assessment: Needs assistance Sitting-balance support: No upper extremity supported;Feet supported Sitting balance-[name]: Good     Standing balance support: Bilateral upper extremity supported;During functional activity Standing balance-[name]: Poor                             ADL either performed or assessed with clinical judgement   ADL Overall  ADL's : Needs assistance/impaired     Grooming: Wash/dry hands;Wash/dry face;Standing;Supervision/safety   Upper Body Bathing: Set up;Independent;Sitting   Lower Body Bathing: Minimal assistance;Sitting/lateral leans;Sit to/from stand   Upper Body Dressing : Set up;Independent;Sitting   Lower Body Dressing: Minimal assistance;Sitting/lateral leans;Sit to/from stand   Toilet Transfer: Min guard;Supervision/safety;RW;Ambulation;Comfort height toilet   Toileting- Water quality scientist and Hygiene: Supervision/safety;Sit to/from stand       Functional mobility during ADLs: Min guard;Supervision/safety;Rolling walker General ADL Comments: pt has all necessary A/E and DME at home for safe ADLs/selfcare     Vision Patient Visual Report: No change from baseline     Perception     Praxis      Cognition Arousal/Alertness: Awake/alert Behavior During Therapy: WFL for tasks assessed/performed Overall Cognitive Status: Within Functional Limits for tasks assessed                                          Exercises     Shoulder Instructions       General Comments      Pertinent Vitals/ Pain       Pain Assessment: No/denies pain  Home Living                                          Prior Functioning/Environment  Frequency           Progress Toward Goals  OT Goals(current goals can now be found in the care plan section)  Progress towards OT goals: Progressing toward goals  Acute Rehab OT Goals Patient Stated Goal: to go home  Plan Discharge plan remains appropriate    Co-evaluation                 AM-PAC OT "6 Clicks" Daily Activity     Outcome Measure   Help from another person eating meals?: None Help from another person taking care of personal grooming?: None Help from another person toileting, which includes using toliet, bedpan, or urinal?: None Help from another person bathing (including  washing, rinsing, drying)?: A Little Help from another person to put on and taking off regular upper body clothing?: None Help from another person to put on and taking off regular lower body clothing?: A Little 6 Click Score: 22    End of Session Equipment Utilized During Treatment: Rolling walker  OT Visit Diagnosis: Unsteadiness on feet (R26.81);Other abnormalities of gait and mobility (R26.89);Pain;History of falling (Z91.81) Pain - Right/Left: Left Pain - part of body: Ankle and joints of foot   Activity Tolerance Patient tolerated treatment well   Patient Left Other (comment);with call bell/phone within reach (in bathroom on toilet)   Nurse Communication          Time: 6067-7034 OT Time Calculation (min): 25 min  Charges: OT General Charges $OT Visit: 1 Visit OT Treatments $Self Care/Home Management : 8-22 mins $Therapeutic Activity: 8-22 mins     [name] 04/07/2020, 1:58 PM

## 2020-04-07 NOTE — Progress Notes (Signed)
PT Cancellation Note  Patient Details Name: SAMAAD HASHEM MRN: [id number] DOB: [birth year]   Cancelled Treatment:    Reason Eval/Treat Not Completed: (P) Patient declined, no reason specified (Pt denies PT needs at this time and would prefer to d/c home vs. participate in PT session.)   Cristela Blue 04/07/2020, 12:55 PM  Erasmo Leventhal , PTA Acute Rehabilitation Services Pager [phone] Office [phone]

## 2020-04-07 NOTE — Progress Notes (Signed)
Discharge instructions addressed; Pt in stable condition; Pt picked up by family member at the [employer] entrance.

## 2020-04-07 NOTE — TOC Transition Note (Addendum)
Transition of Care [facility]) - CM/SW Discharge Note   Patient Details  Name: David Fitzgerald MRN: [id number] Date of Birth: Apr 04, 1960  [facility] CM/SW Contact:  Sharin Mons, RN Phone Number: 04/07/2020, 10:04 AM   Clinical Narrative:     Patient will DC to: home Anticipated DC date: 04/07/2020 Family notified:yes, mom Transport by: car  Readmitted for f/u, pt  s/p skin graft for open wound medial malleolus L ankle, pt underwent ORIF of L ankle 02/01/20.       - s/p INTERNAL FIXATION LEFT TIBIA TO CALCANEUS AND APPLY SKIN GRAFT, 3/2  From home alone. Mom lives next door. PT states mom will assist with needs if needed once d/c. Pt already has DME: rolling walker , W/C and Scooter.  Per PT' s recommendation: Follow surgeon's recommendation for DC plan and follow-up therapies (Outpatient PT once pt permitted for ROM/strengthening/mobility on L leg). Per pt once given the clearance from MD he would like for home health or outpatient services to be arranged with [facility] or [facility].  Pt without Rx  Med concerns, no affordability issues. Post hospital f/u  Noted on AVS.   Per MD patient ready for DC today. RN, patient, and  patient's family notified of DC.     RNCM will sign off for now as intervention is no longer needed. Please consult [place] again if new needs arise.   Final next level of care: Home/Self Care Barriers to Discharge: No Barriers Identified   Patient Goals and CMS Choice Patient states their goals for this hospitalization and ongoing recovery are:: to get home and recover      Discharge Placement                       Discharge Plan and Services                                     Social Determinants of Health (SDOH) Interventions     Readmission Risk Interventions Readmission Risk Prevention Plan 02/02/2020  Post Dischage Appt Complete  Medication Screening Complete  Transportation Screening  Complete  Some recent data might be hidden

## 2020-04-07 NOTE — Progress Notes (Signed)
PT Cancellation Note  Patient Details Name: David Fitzgerald MRN: [id number] DOB: [birth year]   Cancelled Treatment:    Reason Eval/Treat Not Completed: Other (comment) (Pt with OT, will f/u later this am.)   David Fitzgerald 04/07/2020, 10:25 AM  Erasmo Leventhal , PTA Acute Rehabilitation Services Pager [phone] Office [phone]

## 2020-04-09 DIAGNOSIS — G4733 Obstructive sleep apnea (adult) (pediatric): Secondary | ICD-10-CM | POA: Diagnosis not present

## 2020-04-12 ENCOUNTER — Other Ambulatory Visit: Payer: Self-pay

## 2020-04-12 NOTE — Patient Outreach (Addendum)
[facility]) Care Management  04/12/2020  David Fitzgerald Nov 09, 1960 [id number]   Telephone call to patient for follow up recent rehospitalization. No answer. Message left for return call.     Update: received incoming call from his mother Lannette Donath.  She states she lives next door.  Advised her to have patient call CM.  She verbalized understanding. CM contact information given.    Plan: RN CM will attempt again within 4 business days and send letter.   [name], RN, MSN [place] [facility] Care Management Coordinator Direct Line [phone] Cell [phone] Toll Free: [phone]  Fax: [phone]

## 2020-04-13 ENCOUNTER — Other Ambulatory Visit: Payer: Self-pay

## 2020-04-13 NOTE — Patient Outreach (Signed)
[facility]) Care Management  [place]  04/13/2020   David Fitzgerald December 29, 1960 [id number]  Subjective: Spoke with patient. He reports he is doing better.  Discussed patient readmission for repair of left ankle. Patient reports he has a wound vac on and scheduled to see Dr.  Sharol Given on Friday for follow up. Discussed signs of infection and mobility.  He verbalized understanding.   Discussed [facility] services and support with patient. He is ok with CM continued calls for education and support.   Medical history significant of paroxysmal A. fib on Eliquis, IIDM, diabetic neuropathy, morbid obesity, BPH, venous insufficiency on as needed Lasix, presented with mechanical fall and left ankle fracture.  Patient woke up at night, and tripped and fell on the left leg and ankle.  Patient has since had a repair twice to the area. He currently has wound vac in place.  Patient lives alone but his mom live right next door and comes over multiple times a day to help.  He is independent with bathing and dressing but is no weight bearing to left leg and foot.  Patient utilizes knee scooter to get around the home and outside.       Objective:   Encounter Medications:  Outpatient Encounter Medications as of 04/13/2020  Medication Sig Note  . ascorbic acid (VITAMIN C) 1000 MG tablet Take 1 tablet (1,000 mg total) by mouth daily.   . Azelastine-Fluticasone 137-50 MCG/ACT SUSP Place 2 sprays into both nostrils daily as needed (Allergies).   . Cholecalciferol (VITAMIN D) 125 MCG (5000 UT) CAPS Take 1 capsule by mouth daily. (Patient taking differently: Take 5,000 Units by mouth daily.)   . ciprofloxacin (CIPRO) 500 MG tablet Take 1 tablet (500 mg total) by mouth 2 (two) times daily for 10 days.   Marland Kitchen dicyclomine (BENTYL) 20 MG tablet Take 20 mg by mouth 2 (two) times daily.   Marland Kitchen docusate sodium (COLACE) 100 MG capsule Take 1 capsule (100 mg total) by mouth 2 (two) times daily. (Patient taking  differently: Take 100 mg by mouth daily as needed for mild constipation.)   . DULoxetine (CYMBALTA) 60 MG capsule Take 60 mg by mouth daily.    Marland Kitchen ELIQUIS 5 MG TABS tablet TAKE 1 TABLET BY MOUTH TWICE DAILY (Patient taking differently: Take 5 mg by mouth 2 (two) times daily.)   . EPINEPHrine 0.3 mg/0.3 mL IJ SOAJ injection Use for life-threatening allergic reactions (Patient taking differently: Inject 0.3 mg into the muscle as needed (for life-threatening allergic reactions).)   . furosemide (LASIX) 40 MG tablet Take 40 mg by mouth daily as needed for fluid or edema.   . gabapentin (NEURONTIN) 300 MG capsule Take 600 mg by mouth 3 (three) times daily.    . metFORMIN (GLUCOPHAGE) 500 MG tablet Take 500 mg by mouth daily with breakfast.   . metoprolol tartrate (LOPRESSOR) 25 MG tablet Take 1 tablet (25 mg total) by mouth at bedtime. (Patient taking differently: Take 25-50 mg by mouth at bedtime. Take 50 mg in the morning and 25 mg at bedtime) 03/08/2020: The patient takes TWO strengths of Metoprolol Tartrate  . metoprolol tartrate (LOPRESSOR) 50 MG tablet Take 1 tablet (50 mg total) by mouth daily. 03/08/2020: The patient takes TWO strengths of Metoprolol Tartrate  . naloxone (NARCAN) nasal spray 4 mg/0.1 mL Place 1 spray into the nose See admin instructions. OPIOID OVERDOSE EMERGENCY   . naproxen sodium (ALEVE) 220 MG tablet Take 440 mg by mouth daily  as needed (for headaches).   . nitroGLYCERIN (NITROSTAT) 0.4 MG SL tablet Place 0.4 mg under the tongue every 5 (five) minutes as needed for chest pain.   Marland Kitchen ondansetron (ZOFRAN-ODT) 4 MG disintegrating tablet Take 4 mg by mouth every 8 (eight) hours as needed for nausea or vomiting.   Marland Kitchen oxyCODONE-acetaminophen (PERCOCET) 5-325 MG tablet Take 1 tablet by mouth every 4 (four) hours as needed.   . pantoprazole (PROTONIX) 40 MG tablet Take 40 mg by mouth daily.   . simvastatin (ZOCOR) 20 MG tablet Take 20 mg by mouth at bedtime.   . traMADol (ULTRAM) 50 MG tablet  Take 1 tablet (50 mg total) by mouth every 6 (six) hours as needed for moderate pain or severe pain.   . vitamin B-12 1000 MCG tablet Take 1 tablet (1,000 mcg total) by mouth daily.    No facility-administered encounter medications on file as of 04/13/2020.    Functional Status:  In your present state of health, do you have any difficulty performing the following activities: 04/13/2020 04/05/2020  Hearing? N -  Vision? N -  Difficulty concentrating or making decisions? N -  Walking or climbing stairs? Y -  Comment ankle injury -  Dressing or bathing? - -  Doing errands, shopping? N N  Preparing Food and eating ? N -  Using the Toilet? N -  In the past six months, have you accidently leaked urine? N -  Do you have problems with loss of bowel control? N -  Managing your Medications? N -  Managing your Finances? N -  Housekeeping or managing your Housekeeping? N -  Some recent data might be hidden    Fall/Depression Screening: Fall Risk  04/13/2020  Falls in the past year? 1  Number falls in past yr: 0  Injury with Fall? 1  Risk for fall due to : History of fall(s)  Follow up Falls prevention discussed   PHQ 2/9 Scores 04/13/2020 03/15/2020  PHQ - 2 Score 0 0    Assessment:  Goals Addressed            This Visit's Progress   . Careful Skin Care- Left ankle area       Timeframe:  Short-Term Goal Priority:  High Start Date:   04/13/20                          Expected End Date:    07/04/20                   Follow Up Date  05/04/20  - clean and dry skin well  Monitor for signs of redness, increased pain, drainage, or swelling to area       Notes: Patient currently has wound vac in place with follow up scheduled.      . Monitor and Manage My Blood Sugar-Diabetes Type 2       Timeframe:  Long-Range Goal Priority:  High Start Date:   04/13/20                          Expected End Date:    10/04/20                   Follow Up Date 06/03/20   - check blood sugar at  prescribed times - take the blood sugar log to all doctor visits    Why is this important?  Checking your blood sugar at home helps to keep it from getting very high or very low.   Writing the results in a diary or log helps the doctor know how to care for you.   Your blood sugar log should have the time, date and the results.   Also, write down the amount of insulin or other medicine that you take.   Other information, like what you ate, exercise done and how you were feeling, will also be helpful.     Notes: Keep up the great work with your sugars!    . Obtain Eye Exam-Diabetes Type 2       Timeframe:  Short-Term Goal Priority:  Medium Start Date:   04/13/20                          Expected End Date:     07/04/20                  Follow Up Date 06/03/20    - schedule appointment with eye doctor    Why is this important?    Eye check-ups are important when you have diabetes.   Vision loss can be prevented.    Notes:        Plan: RN CM will provide ongoing education and support to patient through phone calls.   RN CM will send welcome packet with consent to patient.   RN CM will send initial barriers letter, assessment, and care plan to primary care physician.   RN CM will contact patient again in March and patient agrees to next contact.   Folllow-up:  Patient agrees to Care Plan and Follow-up.   [name], RN, MSN [facility] Care Management Coordinator Direct Line [phone] Cell [phone] Toll Free: [phone]  Fax: [phone]

## 2020-04-13 NOTE — Patient Instructions (Addendum)
Goals Addressed            This Visit's Progress   . Careful Skin Care- Left ankle area       Timeframe:  Short-Term Goal Priority:  High Start Date:   04/13/20                          Expected End Date:    07/04/20                   Follow Up Date  05/04/20  - clean and dry skin well  Monitor for signs of redness, increased pain, drainage, or swelling to area       Notes: Patient currently has wound vac in place with follow up scheduled.      . Monitor and Manage My Blood Sugar-Diabetes Type 2       Timeframe:  Long-Range Goal Priority:  High Start Date:   04/13/20                          Expected End Date:    10/04/20                   Follow Up Date 06/03/20   - check blood sugar at prescribed times - take the blood sugar log to all doctor visits    Why is this important?    Checking your blood sugar at home helps to keep it from getting very high or very low.   Writing the results in a diary or log helps the doctor know how to care for you.   Your blood sugar log should have the time, date and the results.   Also, write down the amount of insulin or other medicine that you take.   Other information, like what you ate, exercise done and how you were feeling, will also be helpful.     Notes: Keep up the great work with your sugars!    . Obtain Eye Exam-Diabetes Type 2       Timeframe:  Short-Term Goal Priority:  Medium Start Date:   04/13/20                          Expected End Date:     07/04/20                  Follow Up Date 06/03/20    - schedule appointment with eye doctor    Why is this important?    Eye check-ups are important when you have diabetes.   Vision loss can be prevented.    Notes:        Ankle Fracture An ankle fracture is a break in one, two, or all three of the bones in your ankle joint. The ankle joint is made up of:  The lower parts of your tibia and fibula. These are your lower leg bones.  A bone in the foot called the  talus. What are the causes?  A hard, direct hit to the ankle.  Twisting your ankle fast and very badly.  Getting injured in a car crash or from a fall from high up. What increases the risk?  Being overweight.  Doing certain sports, such as soccer, gymnastics, or football. What are the signs or symptoms?  A tender and swollen ankle.  Bruising around your ankle.  Pain when you move  or press on your ankle.  Trouble walking.  Trouble using your ankle to support your body weight (putting weight on your ankle).  Pain that gets worse: ? When you move your ankle or foot. ? When you stand.  Pain that gets better with rest.   How is this treated? This condition may be treated with:  A cast, boot, or splint.  Crutches.  Surgery.  Staying off your injured foot.  Exercises to make your ankle move better and get stronger. Follow these instructions at home: Your doctor may tell you to do these things: If you have a boot or splint:  Wear the boot or splint as told by your doctor. Take it off only as told by your doctor.  Loosen it if your toes: ? Tingle. ? Lose feeling (get numb). ? Turn cold and blue.  Keep it clean and dry. If you have a cast:  Do not put pressure on any part of the cast until it is fully hardened.  Do not stick anything inside the cast to scratch your skin.  Check the skin around the cast every day. Tell your doctor if you see problems.  You may put lotion on dry skin around the cast. Do not put lotion on the skin under the cast.  Keep it clean and dry. Bathing  Do not take baths, swim, or use a hot tub. Ask your doctor about taking showers or sponge baths.  If the cast, splint, or boot is not waterproof: ? Do not let it get wet. ? Cover it with a watertight covering when you take a bath or shower. Managing pain, stiffness, and swelling  If told, put ice on the injured area. To do this: ? If you have a removable splint or boot, take it  off as told by your doctor. ? Put ice in a plastic bag. ? Place a towel between your skin and the bag or between your cast and the bag. ? Leave the ice on for 20 minutes, 2-3 times a day. ? Take off the ice if your skin turns bright red. This is very important. If you cannot feel pain, heat, or cold, you have a greater risk of damage to the area.  Move your toes often.  Raise the injured area above the level of your heart while you are sitting or lying down.   Activity  Do exercises as told by your doctor.  Return to your normal activities when your doctor says that it is safe.  Use crutches to support your body weight. Do not use your injured leg to support your body weight until your doctor says that you can. General instructions  Take over-the-counter and prescription medicines only as told by your doctor.  Ask your doctor when it is safe to drive if you have a cast, boot, or splint on your leg.  Do not smoke or use any products that contain nicotine or tobacco. These can delay bone healing. If you need help quitting, ask your doctor.  Keep all follow-up visits. Contact a doctor if:  Your pain or swelling gets worse.  Your pain or swelling does not get better with rest or medicine.  Your cast gets damaged. Get help right away if:  You have very bad pain that lasts.  You get new pain or swelling.  Your skin or toes below the injured ankle: ? Turn blue or gray. ? Feel cold. ? Lose feeling. ? Have less feeling than normal when something  touches them. Summary  An ankle fracture is a break in one, two, or three bones in your lower leg and foot.  An ankle fracture may be treated with a cast, a boot, a splint, or surgery.  Do not use your injured leg to support your body weight until your doctor says that you can.  Use ice, take medicines, and raise your foot as told. Do not smoke or use products that contain nicotine or tobacco. This information is not intended to  replace advice given to you by your health care provider. Make sure you discuss any questions you have with your health care provider. Document Revised: 04/22/2019 Document Reviewed: 04/22/2019 Elsevier Patient Education  [place].  Wound Infection A wound infection happens when tiny organisms (microorganisms) start to grow in a wound. A wound infection is most often caused by bacteria. Infection can cause the wound to break open or worsen. Wound infection needs treatment. If a wound infection is left untreated, complications can occur. Untreated wound infections may lead to an infection in the bloodstream (septicemia) or a bone infection (osteomyelitis). What are the causes? This condition is most often caused by bacteria growing in a wound. Other microorganisms, like yeast and fungi, can also cause wound infections. What increases the risk? The following factors may make you more likely to develop this condition:  Having a weak body defense system (immune system).  Having diabetes.  Taking steroid medicines for a long time (chronic use).  Smoking.  Being an older person.  Being overweight.  Taking chemotherapy medicines. What are the signs or symptoms? Symptoms of this condition include:  Having more redness, swelling, or pain at the wound site.  Having more blood or fluid at the wound site.  A bad smell coming from a wound or bandage (dressing).  Having a fever.  Feeling tired or fatigued.  Having warmth at or around the wound.  Having pus at the wound site. How is this diagnosed? This condition is diagnosed with a medical history and physical exam. You may also have a wound culture or blood tests or both. How is this treated? This condition is usually treated with an antibiotic medicine.  The infection should improve 24-48 hours after you start antibiotics.  After 24-48 hours, redness around the wound should stop spreading, and the wound should be less  painful. Follow these instructions at home: Medicines  Take or apply over-the-counter and prescription medicines only as told by your health care provider.  If you were prescribed an antibiotic medicine, take or apply it as told by your health care provider. Do not stop using the antibiotic even if you start to feel better. Wound care  Clean the wound each day, or as told by your health care provider. ? Wash the wound with mild soap and water. ? Rinse the wound with water to remove all soap. ? Pat the wound dry with a clean towel. Do not rub it.  Follow instructions from your health care provider about how to take care of your wound. Make sure you: ? Wash your hands with soap and water before and after you change your dressing. If soap and water are not available, use hand sanitizer. ? Change your dressing as told by your health care provider. ? Leave stitches (sutures), skin glue, or adhesive strips in place if your wound has been closed. These skin closures may need to stay in place for 2 weeks or longer. If adhesive strip edges start to loosen  and curl up, you may trim the loose edges. Do not remove adhesive strips completely unless your health care provider tells you to do that. Some wounds are left open to heal on their own.  Check your wound every day for signs of infection. Watch for: ? More redness, swelling, or pain. ? More fluid or blood. ? Warmth. ? Pus or a bad smell.   General instructions  Keep the dressing dry until your health care provider says it can be removed.  Do not take baths, swim, or use a hot tub until your health care provider approves. Ask your health care provider if you may take showers. You may only be allowed to take sponge baths.  Raise (elevate) the injured area above the level of your heart while you are sitting or lying down.  Do not scratch or pick at the wound.  Keep all follow-up visits as told by your health care provider. This is  important. Contact a health care provider if:  Your pain is not controlled with medicine.  You have more redness, swelling, or pain around your wound.  You have more fluid or blood coming from your wound.  Your wound feels warm to the touch.  You have pus coming from your wound.  You continue to notice a bad smell coming from your wound or your dressing.  Your wound that was closed breaks open. Get help right away if:  You have a red streak going away from your wound.  You have a fever. Summary  A wound infection happens when tiny organisms (microorganisms) start to grow in a wound.  This condition is usually treated with an antibiotic medicine.  Follow instructions from your health care provider about how to take care of your wound.  Contact a health care provider if your wound infection does not begin to improve in 24-48 hours, or your symptoms worsen.  Keep all follow-up visits as told by your health care provider. This is important. This information is not intended to replace advice given to you by your health care provider. Make sure you discuss any questions you have with your health care provider. Document Revised: 09/02/2017 Document Reviewed: 09/02/2017 Elsevier Patient Education  [place].

## 2020-04-14 ENCOUNTER — Ambulatory Visit (INDEPENDENT_AMBULATORY_CARE_PROVIDER_SITE_OTHER): Payer: Medicare HMO | Admitting: Family

## 2020-04-14 DIAGNOSIS — S82892B Other fracture of left lower leg, initial encounter for open fracture type I or II: Secondary | ICD-10-CM

## 2020-04-14 DIAGNOSIS — T8133XS Disruption of traumatic injury wound repair, sequela: Secondary | ICD-10-CM

## 2020-04-17 ENCOUNTER — Ambulatory Visit: Payer: Self-pay

## 2020-04-18 ENCOUNTER — Other Ambulatory Visit: Payer: Self-pay

## 2020-04-18 ENCOUNTER — Ambulatory Visit (INDEPENDENT_AMBULATORY_CARE_PROVIDER_SITE_OTHER): Payer: Medicare HMO | Admitting: Orthopedic Surgery

## 2020-04-18 ENCOUNTER — Ambulatory Visit (INDEPENDENT_AMBULATORY_CARE_PROVIDER_SITE_OTHER): Payer: Medicare HMO

## 2020-04-18 ENCOUNTER — Encounter: Payer: Self-pay | Admitting: Orthopedic Surgery

## 2020-04-18 DIAGNOSIS — S82892B Other fracture of left lower leg, initial encounter for open fracture type I or II: Secondary | ICD-10-CM | POA: Diagnosis not present

## 2020-04-18 MED ORDER — SILVER SULFADIAZINE 1 % EX CREA: 1.0000 "application " | TOPICAL_CREAM | Freq: Every day | CUTANEOUS | 3 refills | Status: DC

## 2020-04-18 NOTE — Progress Notes (Signed)
Office Visit Note   Patient: David Fitzgerald           Date of Birth: Jan 27, 1961           MRN: [id number] Visit Date: 04/18/2020              Requested by: Angelina Sheriff, MD [address] [place],  [place] [address] PCP: Angelina Sheriff, MD  Chief Complaint  Patient presents with  . Left Ankle - Routine Post Op      HPI: Patient is a 60 year old gentleman status post open reduction internal fixation for bimalleolar left ankle fracture.  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  He underwent removal of the deep retained hardware removal of the infected bone and tibial calcaneal fusion.  He had skin graft applied to the medial wound.  Patient presents today in follow-up.  Assessment & Plan: Visit Diagnoses:  1. Open fracture dislocation of left ankle     Plan: Patient's Enterobacter was sensitive to both Cipro and Bactrim DS he has completed his Cipro he has a prescription for Bactrim DS so he will resume taking the Bactrim DS.  A prescription is called in for Silvadene and he will start washing the wound with soap and water daily apply Silvadene dry dressing and an Ace wrap.  Anticipate a follow-up we could start him in the compression sock.  He was given instructions to keep the foot above his heart and to float the heel not to put pressure on the heel.  Follow-Up Instructions: No follow-ups on file.   Ortho Exam  Patient is alert, oriented, no adenopathy, well-dressed, normal affect, normal respiratory effort. Examination the wound bed has some clear drainage there is no exposed hardware or bone the wound is flat it is 3 x 7 cm.  The remaining incisions are well approximated a very small amount of clear serous drainage.  Patient's leg is straight the swelling is significantly decreased.  Imaging: XR Ankle Complete Left  Result Date: 04/18/2020 2 view radiographs of the left ankle shows stable tibial calcaneal fusion no  loss of reduction.  No air in the soft tissue.    Labs: Lab Results  Component Value Date   HGBA1C 6.0 (H) 03/08/2020   HGBA1C 6.0 (H) 02/04/2020   REPTSTATUS 03/15/2020 FINAL 03/10/2020   GRAMSTAIN  03/10/2020    MODERATE WBC PRESENT, PREDOMINANTLY PMN NO ORGANISMS SEEN    CULT  03/10/2020    RARE ENTEROBACTER CLOACAE CRITICAL RESULT CALLED TO, READ BACK BY AND VERIFIED WITH: RN TGilford Rile [phone] FCP NO ANAEROBES ISOLATED Performed at [facility], [place] [address] [place], [place] [address]    [place] 03/10/2020     Lab Results  Component Value Date   ALBUMIN 2.7 (L) 03/10/2020   ALBUMIN 2.9 (L) 03/08/2020   ALBUMIN 2.9 (L) 02/16/2020    Lab Results  Component Value Date   MG 2.1 02/08/2020   MG 2.0 02/04/2020   MG 1.9 02/03/2020   Lab Results  Component Value Date   VD25OH 19.57 (L) 03/08/2020   VD25OH 16.36 (L) 02/16/2020    No results found for: PREALBUMIN CBC EXTENDED Latest Ref Rng & Units 04/06/2020 03/08/2020 02/16/2020  WBC 4.0 - 10.5 K/uL - 10.6(H) 8.4  RBC 4.22 - 5.81 MIL/uL - 4.50 4.16(L)  HGB 13.0 - 17.0 g/dL 8.9(L) 12.7(L) 12.4(L)  HCT 39.0 - 52.0 % 29.4(L) 42.0 38.6(L)  PLT 150 - 400 K/uL - 460(H) 302  NEUTROABS 1.7 - 7.7 K/uL - 7.7 -  LYMPHSABS 0.7 - 4.0 K/uL - 1.5 -     There is no height or weight on file to calculate BMI.  Orders:  Orders Placed This Encounter  Procedures  . XR Ankle Complete Left   No orders of the defined types were placed in this encounter.    Procedures: No procedures performed  Clinical Data: No additional findings.  ROS:  All other systems negative, except as noted in the HPI. Review of Systems  Objective: Vital Signs: There were no vitals taken for this visit.  Specialty Comments:  No specialty comments available.  PMFS History: Patient Active Problem List   Diagnosis Date Noted  . Left ankle pain 04/05/2020  . Open dislocation of left ankle   . A-fib ([place])   .  OSA (obstructive sleep apnea) 03/09/2020  . Wound dehiscence, traumatic injury repair, left medial ankle  03/08/2020  . Vitamin D deficiency 02/16/2020  . Chronic, continuous use of opioids 02/15/2020  . Open fracture dislocation of left ankle 01/31/2020  . Ankle fracture 01/31/2020  . Permanent atrial fibrillation ([place])   . Hypertension   . Diabetes ([place])   . Bee sting allergy   . Hypotension 12/25/2017  . Chronic anticoagulation 03/07/2017  . Coronary artery calcification seen on CT scan 02/07/2017  . CAD in native artery 02/07/2017  . Persistent atrial fibrillation ([place]) 02/06/2017  . Anxiety 02/06/2017  . Arthritis 02/06/2017  . Pernicious anemia 02/06/2017  . Allergy to pollen 04/06/2015  . OSA treated with BiPAP 12/14/2014  . Obesity hypoventilation syndrome ([place]) 12/14/2014  . Morbid obesity with BMI of 40.0-44.9, adult ([place]) 12/14/2014  . Hypertensive heart disease 12/14/2014  . Hyperlipidemia, mixed 12/14/2014  . Type 2 diabetes mellitus with diabetic neuropathy ([place]) 12/14/2014  . Chronic venous insufficiency 12/14/2014  . Edema 12/14/2014  . Morbid obesity ([place]) 12/14/2014  . Bell's palsy 02/24/2013  . Polyneuropathy in diabetes ([place]) 02/24/2013  . Syndrome affecting cervical region 02/24/2013  . Trigeminal neuralgia 02/24/2013  . Lumbar pain with radiation down left leg 07/09/2012  . BMI 45.0-49.9, adult ([place]) 07/09/2012  . Chronic pain syndrome 07/09/2012  . Morbid obesity with BMI of 45.0-49.9, adult ([place]) 07/09/2012  . Pain syndrome, chronic 07/09/2012   Past Medical History:  Diagnosis Date  . A-fib ([place])   . Allergy to pollen 04/06/2015  . Anxiety 02/06/2017  . Arthritis 02/06/2017  . Asthma    as a child  . Bee sting allergy    wasp / mixed vespid  . Bell's palsy 02/24/2013  . Chronic venous insufficiency 12/14/2014  . Chronic, continuous use of opioids 02/15/2020  . Depression   . Diabetes ([place])   . Dysrhythmia    afib  . Edema 12/14/2014  . GERD  (gastroesophageal reflux disease)   . History of kidney stones   . Hyperlipidemia, mixed 12/14/2014  . Hypertension   . Lumbar pain with radiation down left leg 07/09/2012  . Morbid obesity ([place]) 12/14/2014  . Morbid obesity with BMI of 45.0-49.9, adult ([place]) 07/09/2012  . Obesity hypoventilation syndrome ([place]) 12/14/2014  . OSA (obstructive sleep apnea) 03/09/2020   doesn't use a Cpap  . OSA treated with BiPAP 12/14/2014  . Pain syndrome, chronic 07/09/2012  . Pernicious anemia 02/06/2017  . Pneumonia   . Polyneuropathy in diabetes ([place]) 02/24/2013  . Syndrome affecting cervical region 02/24/2013  . Trigeminal neuralgia 02/24/2013  . Type  2 diabetes mellitus with diabetic neuropathy ([place]) 12/14/2014  . Vitamin D deficiency 02/16/2020  . Wound dehiscence, traumatic injury repair, left medial ankle  03/08/2020    Family History  Problem Relation Age of Onset  . Breast cancer Mother   . Heart disease Father   . CAD Father   . Atrial fibrillation Father   . Stomach cancer Paternal Aunt   . Heart disease Paternal Uncle   . Breast cancer Maternal Grandmother     Past Surgical History:  Procedure Laterality Date  .  INTERNAL FIXATION LEFT TIBIA TO CALCANEUS AND APPLY SKIN GRAFT (Left Ankle)  04/05/2020  . ADENOIDECTOMY    . BACK SURGERY  2002 /2003  . CARPAL TUNNEL RELEASE Bilateral   . I & D EXTREMITY Left 01/31/2020   Procedure: IRRIGATION AND DEBRIDEMENT ANKLE;  Surgeon: Altamese [place], MD;  Location: [facility];  Service: Orthopedics;  Laterality: Left;  . I & D EXTREMITY Left 03/10/2020   Procedure: DEBRIDEMENT LEFT ANKLE, APPLY SKIN GRAFT;  Surgeon: [name], MD;  Location: [facility];  Service: Orthopedics;  Laterality: Left;  . KNEE SURGERY Left 2005  . ORIF ANKLE FRACTURE Left 01/31/2020  . ORIF ANKLE FRACTURE Left 01/31/2020   Procedure: OPEN REDUCTION INTERNAL FIXATION (ORIF) ANKLE FRACTURE;  Surgeon: Altamese [place], MD;  Location: [facility];  Service: Orthopedics;  Laterality: Left;  . ORIF ANKLE  FRACTURE Left 04/05/2020   Procedure: INTERNAL FIXATION LEFT TIBIA TO CALCANEUS AND APPLY SKIN GRAFT;  Surgeon: [name], MD;  Location: [facility];  Service: Orthopedics;  Laterality: Left;  . SHOULDER SURGERY Left 1998  . TONSILLECTOMY     Social History   Occupational History  . Not on file  Tobacco Use  . Smoking status: Former Smoker    Packs/day: 3.00    Years: 35.00    Pack years: 105.00    Types: Cigarettes    Quit date: 04/13/2006    Years since quitting: 14.0  . Smokeless tobacco: Never Used  Vaping Use  . Vaping Use: Never used  Substance and Sexual Activity  . Alcohol use: No    Alcohol/week: 0.0 standard drinks  . Drug use: No  . Sexual activity: Not on file

## 2020-04-19 ENCOUNTER — Encounter: Payer: Self-pay | Admitting: Family

## 2020-04-19 NOTE — Progress Notes (Signed)
Post-Op Visit Note   Patient: David Fitzgerald           Date of Birth: 1960/12/13           MRN: [id number] Visit Date: 04/14/2020 PCP: Angelina Sheriff, MD  Chief Complaint:  Chief Complaint  Patient presents with  . Left Ankle - Routine Post Op    04/05/20 tib- cal fusion Kerecis skin graft    HPI:  HPI The patient is a 60 year old gentleman seen status post open reduction internal fixation for bimalleolar left ankle fracture.  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  Subsequently underwent removal of deep retained hardware removal of the infected bone with tibial calcaneal fusion.  Had Kerecis his graft applied over the medial wound.  Is currently taking Cipro. States was taking Bactrim prior to hospitalization, was not discharged on this.  Ortho Exam On examination of the left lower extremity there is good wrinkling of the skin.  The wound bed has scant bloody drainage.  There is no exposed hardware no exposed bone.  The wound is 3 x 7 cm.  The adjacent incision is well approximated with sutures.  Healing well.  Visit Diagnoses:  1. Open fracture dislocation of left ankle   2. Disruption of traumatic injury wound repair, sequela     Plan: We will placed Vaseline gauze over the wound.  Dynaflex compression wrap to the left lower extremity  Follow-Up Instructions: Return in about 4 days (around 04/18/2020).   Imaging: XR Ankle Complete Left  Result Date: 04/18/2020 2 view radiographs of the left ankle shows stable tibial calcaneal fusion no loss of reduction.  No air in the soft tissue.   Orders:  No orders of the defined types were placed in this encounter.  No orders of the defined types were placed in this encounter.    PMFS History: Patient Active Problem List   Diagnosis Date Noted  . Left ankle pain 04/05/2020  . Open dislocation of left ankle   . A-fib ([place])   . OSA (obstructive sleep apnea) 03/09/2020  .  Wound dehiscence, traumatic injury repair, left medial ankle  03/08/2020  . Vitamin D deficiency 02/16/2020  . Chronic, continuous use of opioids 02/15/2020  . Open fracture dislocation of left ankle 01/31/2020  . Ankle fracture 01/31/2020  . Permanent atrial fibrillation ([place])   . Hypertension   . Diabetes ([place])   . Bee sting allergy   . Hypotension 12/25/2017  . Chronic anticoagulation 03/07/2017  . Coronary artery calcification seen on CT scan 02/07/2017  . CAD in native artery 02/07/2017  . Persistent atrial fibrillation ([place]) 02/06/2017  . Anxiety 02/06/2017  . Arthritis 02/06/2017  . Pernicious anemia 02/06/2017  . Allergy to pollen 04/06/2015  . OSA treated with BiPAP 12/14/2014  . Obesity hypoventilation syndrome ([place]) 12/14/2014  . Morbid obesity with BMI of 40.0-44.9, adult ([place]) 12/14/2014  . Hypertensive heart disease 12/14/2014  . Hyperlipidemia, mixed 12/14/2014  . Type 2 diabetes mellitus with diabetic neuropathy ([place]) 12/14/2014  . Chronic venous insufficiency 12/14/2014  . Edema 12/14/2014  . Morbid obesity ([place]) 12/14/2014  . Bell's palsy 02/24/2013  . Polyneuropathy in diabetes ([place]) 02/24/2013  . Syndrome affecting cervical region 02/24/2013  . Trigeminal neuralgia 02/24/2013  . Lumbar pain with radiation down left leg 07/09/2012  . BMI 45.0-49.9, adult ([place]) 07/09/2012  . Chronic pain syndrome 07/09/2012  . Morbid obesity with BMI of 45.0-49.9, adult ([place]) 07/09/2012  .  Pain syndrome, chronic 07/09/2012   Past Medical History:  Diagnosis Date  . A-fib ([place])   . Allergy to pollen 04/06/2015  . Anxiety 02/06/2017  . Arthritis 02/06/2017  . Asthma    as a child  . Bee sting allergy    wasp / mixed vespid  . Bell's palsy 02/24/2013  . Chronic venous insufficiency 12/14/2014  . Chronic, continuous use of opioids 02/15/2020  . Depression   . Diabetes ([place])   . Dysrhythmia    afib  . Edema 12/14/2014  . GERD (gastroesophageal reflux disease)   . History of  kidney stones   . Hyperlipidemia, mixed 12/14/2014  . Hypertension   . Lumbar pain with radiation down left leg 07/09/2012  . Morbid obesity ([place]) 12/14/2014  . Morbid obesity with BMI of 45.0-49.9, adult ([place]) 07/09/2012  . Obesity hypoventilation syndrome ([place]) 12/14/2014  . OSA (obstructive sleep apnea) 03/09/2020   doesn't use a Cpap  . OSA treated with BiPAP 12/14/2014  . Pain syndrome, chronic 07/09/2012  . Pernicious anemia 02/06/2017  . Pneumonia   . Polyneuropathy in diabetes ([place]) 02/24/2013  . Syndrome affecting cervical region 02/24/2013  . Trigeminal neuralgia 02/24/2013  . Type 2 diabetes mellitus with diabetic neuropathy ([place]) 12/14/2014  . Vitamin D deficiency 02/16/2020  . Wound dehiscence, traumatic injury repair, left medial ankle  03/08/2020    Family History  Problem Relation Age of Onset  . Breast cancer Mother   . Heart disease Father   . CAD Father   . Atrial fibrillation Father   . Stomach cancer Paternal Aunt   . Heart disease Paternal Uncle   . Breast cancer Maternal Grandmother     Past Surgical History:  Procedure Laterality Date  .  INTERNAL FIXATION LEFT TIBIA TO CALCANEUS AND APPLY SKIN GRAFT (Left Ankle)  04/05/2020  . ADENOIDECTOMY    . BACK SURGERY  2002 /2003  . CARPAL TUNNEL RELEASE Bilateral   . I & D EXTREMITY Left 01/31/2020   Procedure: IRRIGATION AND DEBRIDEMENT ANKLE;  Surgeon: Altamese [place], MD;  Location: [facility];  Service: Orthopedics;  Laterality: Left;  . I & D EXTREMITY Left 03/10/2020   Procedure: DEBRIDEMENT LEFT ANKLE, APPLY SKIN GRAFT;  Surgeon: [name], MD;  Location: [facility];  Service: Orthopedics;  Laterality: Left;  . KNEE SURGERY Left 2005  . ORIF ANKLE FRACTURE Left 01/31/2020  . ORIF ANKLE FRACTURE Left 01/31/2020   Procedure: OPEN REDUCTION INTERNAL FIXATION (ORIF) ANKLE FRACTURE;  Surgeon: Altamese [place], MD;  Location: [facility];  Service: Orthopedics;  Laterality: Left;  . ORIF ANKLE FRACTURE Left 04/05/2020   Procedure: INTERNAL  FIXATION LEFT TIBIA TO CALCANEUS AND APPLY SKIN GRAFT;  Surgeon: [name], MD;  Location: [facility];  Service: Orthopedics;  Laterality: Left;  . SHOULDER SURGERY Left 1998  . TONSILLECTOMY     Social History   Occupational History  . Not on file  Tobacco Use  . Smoking status: Former Smoker    Packs/day: 3.00    Years: 35.00    Pack years: 105.00    Types: Cigarettes    Quit date: 04/13/2006    Years since quitting: 14.0  . Smokeless tobacco: Never Used  Vaping Use  . Vaping Use: Never used  Substance and Sexual Activity  . Alcohol use: No    Alcohol/week: 0.0 standard drinks  . Drug use: No  . Sexual activity: Not on file

## 2020-04-21 ENCOUNTER — Other Ambulatory Visit: Payer: Self-pay

## 2020-04-21 NOTE — Patient Outreach (Signed)
[facility]) Care Management  04/21/2020  KIREE [name] 1960/06/08 [id number]   Date of Review: 04/21/20 Reason:  Readmission  PCP: Dr. Lovette Cliche Insurance: [employer]  Medical Info: David Fitzgerald is a 60 y.o. male with medical history significant of paroxysmal A. fib on Eliquis, IIDM, diabetic neuropathy, morbid obesity, BPH, venous insufficiency with recent left ankle fracture. Patient lives alone but has mother who lives next door. Patient gets around with his knee scooter and is independent with care. His mom is available to assist as needed.    Admissions: Patient with 2 admissions in the last month. [name] is a 60 y.o. male who sustained an open left trimalleolar ankle fracture dislocation at the end of December 2021 at that time he had an open reduction and internal fixation. He was discharged to a SNF at that time.    Patient was admitted on 03/08/20 to 03/14/20 after presentation to physician office with bleeding from his cast. 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  Pins were removed, wound was debrided and skin graft placed. He was also treated with antibiotics.  Sent home with a wound vac in place and follow up with surgeon.     Patient admitted again on 04/06/20 to 04/07/20 after presentation to MD with increased deformity to the left ankle and increased drainage from the skin graft.  internal fixation of left tibia to calcaneus was completed along with a skin graft placed again.  He was also treated with antibiotics.  Sent home with a wound vac in place and follow up with surgeon.     Disposition: Patient discharged home with wound vac and follow up with physician.  [name], RN, MSN [facility] Care Management Coordinator Direct Line [phone] Cell [phone] Toll Free: [phone]  Fax: [phone]

## 2020-04-24 DIAGNOSIS — M1612 Unilateral primary osteoarthritis, left hip: Secondary | ICD-10-CM | POA: Diagnosis not present

## 2020-04-24 DIAGNOSIS — Z79891 Long term (current) use of opiate analgesic: Secondary | ICD-10-CM | POA: Diagnosis not present

## 2020-04-24 DIAGNOSIS — M47816 Spondylosis without myelopathy or radiculopathy, lumbar region: Secondary | ICD-10-CM | POA: Diagnosis not present

## 2020-04-24 DIAGNOSIS — M5136 Other intervertebral disc degeneration, lumbar region: Secondary | ICD-10-CM | POA: Diagnosis not present

## 2020-04-24 DIAGNOSIS — M47818 Spondylosis without myelopathy or radiculopathy, sacral and sacrococcygeal region: Secondary | ICD-10-CM | POA: Diagnosis not present

## 2020-04-24 DIAGNOSIS — M25552 Pain in left hip: Secondary | ICD-10-CM | POA: Diagnosis not present

## 2020-04-24 DIAGNOSIS — G894 Chronic pain syndrome: Secondary | ICD-10-CM | POA: Diagnosis not present

## 2020-04-25 ENCOUNTER — Other Ambulatory Visit: Payer: Self-pay

## 2020-04-25 NOTE — Patient Outreach (Signed)
[name]) Care Management  [place]  04/25/2020   David Fitzgerald Feb 05, 1961 [id number]  Subjective: Telephone call to patient for follow up. Patient reports he is doing good. He reports that his wound vac is off and he is dressing wound daily.  He reports that the doctor is just concerned about the open wound on his foot but that the bone is healing.  Discussed signs of infection and to notify physician immediately for any changes. He verbalized understanding and voices no concerns. He sees the physician again on 04/27/20.    Objective:   Encounter Medications:  Outpatient Encounter Medications as of 04/25/2020  Medication Sig Note  . Azelastine-Fluticasone 137-50 MCG/ACT SUSP Place 2 sprays into both nostrils daily as needed (Allergies).   . Cholecalciferol (VITAMIN D) 125 MCG (5000 UT) CAPS Take 1 capsule by mouth daily. (Patient taking differently: Take 5,000 Units by mouth daily.)   . dicyclomine (BENTYL) 20 MG tablet Take 20 mg by mouth 2 (two) times daily.   Marland Kitchen docusate sodium (COLACE) 100 MG capsule Take 1 capsule (100 mg total) by mouth 2 (two) times daily. (Patient taking differently: Take 100 mg by mouth daily as needed for mild constipation.)   . DULoxetine (CYMBALTA) 60 MG capsule Take 60 mg by mouth daily.    Marland Kitchen ELIQUIS 5 MG TABS tablet TAKE 1 TABLET BY MOUTH TWICE DAILY (Patient taking differently: Take 5 mg by mouth 2 (two) times daily.)   . EPINEPHrine 0.3 mg/0.3 mL IJ SOAJ injection Use for life-threatening allergic reactions (Patient taking differently: Inject 0.3 mg into the muscle as needed (for life-threatening allergic reactions).)   . furosemide (LASIX) 40 MG tablet Take 40 mg by mouth daily as needed for fluid or edema.   . gabapentin (NEURONTIN) 300 MG capsule Take 600 mg by mouth 3 (three) times daily.    . metFORMIN (GLUCOPHAGE) 500 MG tablet Take 500 mg by mouth daily with breakfast.   . metoprolol tartrate (LOPRESSOR) 25 MG tablet Take 1  tablet (25 mg total) by mouth at bedtime. (Patient taking differently: Take 25-50 mg by mouth at bedtime. Take 50 mg in the morning and 25 mg at bedtime) 03/08/2020: The patient takes TWO strengths of Metoprolol Tartrate  . metoprolol tartrate (LOPRESSOR) 50 MG tablet Take 1 tablet (50 mg total) by mouth daily. 03/08/2020: The patient takes TWO strengths of Metoprolol Tartrate  . naloxone (NARCAN) nasal spray 4 mg/0.1 mL Place 1 spray into the nose See admin instructions. OPIOID OVERDOSE EMERGENCY   . naproxen sodium (ALEVE) 220 MG tablet Take 440 mg by mouth daily as needed (for headaches).   . nitroGLYCERIN (NITROSTAT) 0.4 MG SL tablet Place 0.4 mg under the tongue every 5 (five) minutes as needed for chest pain.   Marland Kitchen ondansetron (ZOFRAN-ODT) 4 MG disintegrating tablet Take 4 mg by mouth every 8 (eight) hours as needed for nausea or vomiting.   Marland Kitchen oxyCODONE-acetaminophen (PERCOCET) 5-325 MG tablet Take 1 tablet by mouth every 4 (four) hours as needed.   . pantoprazole (PROTONIX) 40 MG tablet Take 40 mg by mouth daily.   . silver sulfADIAZINE (SILVADENE) 1 % cream Apply 1 application topically daily. Apply to affected area daily plus dry dressing   . simvastatin (ZOCOR) 20 MG tablet Take 20 mg by mouth at bedtime.   . traMADol (ULTRAM) 50 MG tablet Take 1 tablet (50 mg total) by mouth every 6 (six) hours as needed for moderate pain or severe pain.   Marland Kitchen  vitamin B-12 1000 MCG tablet Take 1 tablet (1,000 mcg total) by mouth daily.    No facility-administered encounter medications on file as of 04/25/2020.    Functional Status:  In your present state of health, do you have any difficulty performing the following activities: 04/13/2020 04/05/2020  Hearing? N -  Vision? N -  Difficulty concentrating or making decisions? N -  Walking or climbing stairs? Y -  Comment ankle injury -  Dressing or bathing? N -  Doing errands, shopping? N N  Preparing Food and eating ? N -  Using the Toilet? N -  In the past  six months, have you accidently leaked urine? N -  Do you have problems with loss of bowel control? N -  Managing your Medications? N -  Managing your Finances? N -  Housekeeping or managing your Housekeeping? N -  Some recent data might be hidden    Fall/Depression Screening: Fall Risk  04/13/2020  Falls in the past year? 1  Number falls in past yr: 0  Injury with Fall? 1  Risk for fall due to : History of fall(s)  Follow up Falls prevention discussed   PHQ 2/9 Scores 04/13/2020 03/15/2020  PHQ - 2 Score 0 0    Assessment:  Goals Addressed            This Visit's Progress   . Careful Skin Care- Left ankle area   On track    Timeframe:  Short-Term Goal Priority:  High Start Date:   04/13/20                          Expected End Date:    07/04/20                   Follow Up Date  3/31/2  Provide wound care daily. Monitor for signs of redness, increased pain, drainage, or swelling to area       Notes: Patient currently has wound vac in place with follow up scheduled.   04/25/20 Wound vac removed.    . Monitor and Manage My Blood Sugar-Diabetes Type 2   On track    Timeframe:  Long-Range Goal Priority:  High Start Date:   04/13/20                          Expected End Date:    10/04/20                   Follow Up Date 06/03/20   - enter blood sugar readings and medication or insulin into daily log    Why is this important?    Checking your blood sugar at home helps to keep it from getting very high or very low.   Writing the results in a diary or log helps the doctor know how to care for you.   Your blood sugar log should have the time, date and the results.   Also, write down the amount of insulin or other medicine that you take.   Other information, like what you ate, exercise done and how you were feeling, will also be helpful.     Notes: Keep up the great work with your sugars!    . Obtain Eye Exam-Diabetes Type 2   On track    Timeframe:  Short-Term  Goal Priority:  Medium Start Date:   04/13/20  Expected End Date:     07/04/20                  Follow Up Date 06/03/20    - schedule appointment with eye doctor    Why is this important?    Eye check-ups are important when you have diabetes.   Vision loss can be prevented.    Notes: Please schedule eye appointment.       Plan: RN CM will contact patient again in the month of April and patient agreeable.  Follow-up:  Patient agrees to Care Plan and Follow-up.   [name], RN, MSN [facility] Management Care Management Coordinator Direct Line [phone] Cell [phone] Toll Free: [phone]  Fax: [phone]

## 2020-04-25 NOTE — Patient Instructions (Signed)
Goals Addressed            This Visit's Progress   . Careful Skin Care- Left ankle area   On track    Timeframe:  Short-Term Goal Priority:  High Start Date:   04/13/20                          Expected End Date:    07/04/20                   Follow Up Date  3/31/2  Provide wound care daily. Monitor for signs of redness, increased pain, drainage, or swelling to area       Notes: Patient currently has wound vac in place with follow up scheduled.   04/25/20 Wound vac removed.    . Monitor and Manage My Blood Sugar-Diabetes Type 2   On track    Timeframe:  Long-Range Goal Priority:  High Start Date:   04/13/20                          Expected End Date:    10/04/20                   Follow Up Date 06/03/20   - enter blood sugar readings and medication or insulin into daily log    Why is this important?    Checking your blood sugar at home helps to keep it from getting very high or very low.   Writing the results in a diary or log helps the doctor know how to care for you.   Your blood sugar log should have the time, date and the results.   Also, write down the amount of insulin or other medicine that you take.   Other information, like what you ate, exercise done and how you were feeling, will also be helpful.     Notes: Keep up the great work with your sugars!    Illa Level Eye Exam-Diabetes Type 2   On track    Timeframe:  Short-Term Goal Priority:  Medium Start Date:   04/13/20                          Expected End Date:     07/04/20                  Follow Up Date 06/03/20    - schedule appointment with eye doctor    Why is this important?    Eye check-ups are important when you have diabetes.   Vision loss can be prevented.    Notes: Please schedule eye appointment.

## 2020-04-26 ENCOUNTER — Ambulatory Visit: Payer: Medicare HMO

## 2020-04-26 ENCOUNTER — Ambulatory Visit: Payer: Self-pay

## 2020-04-27 ENCOUNTER — Ambulatory Visit (INDEPENDENT_AMBULATORY_CARE_PROVIDER_SITE_OTHER): Payer: Medicare HMO | Admitting: Physician Assistant

## 2020-04-27 ENCOUNTER — Encounter: Payer: Self-pay | Admitting: Orthopedic Surgery

## 2020-04-27 DIAGNOSIS — S82892B Other fracture of left lower leg, initial encounter for open fracture type I or II: Secondary | ICD-10-CM

## 2020-04-27 NOTE — Progress Notes (Signed)
Office Visit Note   Patient: David Fitzgerald           Date of Birth: 14-Feb-1960           MRN: [id number] Visit Date: 04/27/2020              Requested by: Angelina Sheriff, MD [address] [place],  [place] [address] PCP: Angelina Sheriff, MD  Chief Complaint  Patient presents with  . Left Ankle - Follow-up      HPI: Patient is a pleasant 60 year old gentleman who is 4 weeks status post tibial calcaneal arthrodesis.  He is on antibiotics and still has 2 weeks lef  He has been doing daily dressing changes with Silvadene.  Assessment & Plan: Visit Diagnoses: No diagnosis found.  Plan: Continue daily cleansing with antibacterial soap and water.  We will continue on antibiotics.  Will do dry dressing changes without the Silvadene.  Follow-up in 1 week.  Should continue to focus on elevating above heart level if possible Follow-Up Instructions: No follow-ups on file.   Ortho Exam  Patient is alert, oriented, no adenopathy, well-dressed, normal affect, normal respiratory effort. Examination lateral incision is healed very well all but 2 of the most proximal sutures were removed today.  No erythema.  Posterior wound still has some swelling and healing but no surrounding erythema or signs of infection.  Medial wound as described below.  Is very moist.  This is dried and a new dressing was a dried.  Of note there was no odor no ascending cellulitis he does have moderate soft tissue swelling.  Imaging: No results found.    Labs: Lab Results  Component Value Date   HGBA1C 6.0 (H) 03/08/2020   HGBA1C 6.0 (H) 02/04/2020   REPTSTATUS 03/15/2020 FINAL 03/10/2020   GRAMSTAIN  03/10/2020    MODERATE WBC PRESENT, PREDOMINANTLY PMN NO ORGANISMS SEEN    CULT  03/10/2020    RARE ENTEROBACTER CLOACAE CRITICAL RESULT CALLED TO, READ BACK BY AND VERIFIED WITH: RN TGilford Rile [phone] FCP NO ANAEROBES ISOLATED Performed at [facility], [place] [address], [place], [place]  [address]    [place] 03/10/2020     Lab Results  Component Value Date   ALBUMIN 2.7 (L) 03/10/2020   ALBUMIN 2.9 (L) 03/08/2020   ALBUMIN 2.9 (L) 02/16/2020    Lab Results  Component Value Date   MG 2.1 02/08/2020   MG 2.0 02/04/2020   MG 1.9 02/03/2020   Lab Results  Component Value Date   VD25OH 19.57 (L) 03/08/2020   VD25OH 16.36 (L) 02/16/2020    No results found for: PREALBUMIN CBC EXTENDED Latest Ref Rng & Units 04/06/2020 03/08/2020 02/16/2020  WBC 4.0 - 10.5 K/uL - 10.6(H) 8.4  RBC 4.22 - 5.81 MIL/uL - 4.50 4.16(L)  HGB 13.0 - 17.0 g/dL 8.9(L) 12.7(L) 12.4(L)  HCT 39.0 - 52.0 % 29.4(L) 42.0 38.6(L)  PLT 150 - 400 K/uL - 460(H) 302  NEUTROABS 1.7 - 7.7 K/uL - 7.7 -  LYMPHSABS 0.7 - 4.0 K/uL - 1.5 -     There is no height or weight on file to calculate BMI.  Orders:  No orders of the defined types were placed in this encounter.  No orders of the defined types were placed in this encounter.    Procedures: No procedures performed  Clinical Data: No additional findings.  ROS:  All other systems negative, except as noted in the HPI. Review  of Systems  Objective: Vital Signs: There were no vitals taken for this visit.  Specialty Comments:  No specialty comments available.  PMFS History: Patient Active Problem List   Diagnosis Date Noted  . Left ankle pain 04/05/2020  . Open dislocation of left ankle   . A-fib ([place])   . OSA (obstructive sleep apnea) 03/09/2020  . Wound dehiscence, traumatic injury repair, left medial ankle  03/08/2020  . Vitamin D deficiency 02/16/2020  . Chronic, continuous use of opioids 02/15/2020  . Open fracture dislocation of left ankle 01/31/2020  . Ankle fracture 01/31/2020  . Permanent atrial fibrillation ([place])   . Hypertension   . Diabetes ([place])   . Bee sting allergy   . Hypotension 12/25/2017  . Chronic anticoagulation 03/07/2017  . Coronary artery calcification seen on CT scan 02/07/2017  . CAD  in native artery 02/07/2017  . Persistent atrial fibrillation ([place]) 02/06/2017  . Anxiety 02/06/2017  . Arthritis 02/06/2017  . Pernicious anemia 02/06/2017  . Allergy to pollen 04/06/2015  . OSA treated with BiPAP 12/14/2014  . Obesity hypoventilation syndrome ([place]) 12/14/2014  . Morbid obesity with BMI of 40.0-44.9, adult ([place]) 12/14/2014  . Hypertensive heart disease 12/14/2014  . Hyperlipidemia, mixed 12/14/2014  . Type 2 diabetes mellitus with diabetic neuropathy ([place]) 12/14/2014  . Chronic venous insufficiency 12/14/2014  . Edema 12/14/2014  . Morbid obesity ([place]) 12/14/2014  . Bell's palsy 02/24/2013  . Polyneuropathy in diabetes ([place]) 02/24/2013  . Syndrome affecting cervical region 02/24/2013  . Trigeminal neuralgia 02/24/2013  . Lumbar pain with radiation down left leg 07/09/2012  . BMI 45.0-49.9, adult ([place]) 07/09/2012  . Chronic pain syndrome 07/09/2012  . Morbid obesity with BMI of 45.0-49.9, adult ([place]) 07/09/2012  . Pain syndrome, chronic 07/09/2012   Past Medical History:  Diagnosis Date  . A-fib ([place])   . Allergy to pollen 04/06/2015  . Anxiety 02/06/2017  . Arthritis 02/06/2017  . Asthma    as a child  . Bee sting allergy    wasp / mixed vespid  . Bell's palsy 02/24/2013  . Chronic venous insufficiency 12/14/2014  . Chronic, continuous use of opioids 02/15/2020  . Depression   . Diabetes ([place])   . Dysrhythmia    afib  . Edema 12/14/2014  . GERD (gastroesophageal reflux disease)   . History of kidney stones   . Hyperlipidemia, mixed 12/14/2014  . Hypertension   . Lumbar pain with radiation down left leg 07/09/2012  . Morbid obesity ([place]) 12/14/2014  . Morbid obesity with BMI of 45.0-49.9, adult ([place]) 07/09/2012  . Obesity hypoventilation syndrome ([place]) 12/14/2014  . OSA (obstructive sleep apnea) 03/09/2020   doesn't use a Cpap  . OSA treated with BiPAP 12/14/2014  . Pain syndrome, chronic 07/09/2012  . Pernicious anemia 02/06/2017  . Pneumonia   . Polyneuropathy in  diabetes ([place]) 02/24/2013  . Syndrome affecting cervical region 02/24/2013  . Trigeminal neuralgia 02/24/2013  . Type 2 diabetes mellitus with diabetic neuropathy ([place]) 12/14/2014  . Vitamin D deficiency 02/16/2020  . Wound dehiscence, traumatic injury repair, left medial ankle  03/08/2020    Family History  Problem Relation Age of Onset  . Breast cancer Mother   . Heart disease Father   . CAD Father   . Atrial fibrillation Father   . Stomach cancer Paternal Aunt   . Heart disease Paternal Uncle   . Breast cancer Maternal Grandmother     Past Surgical History:  Procedure Laterality Date  .  INTERNAL FIXATION  LEFT TIBIA TO CALCANEUS AND APPLY SKIN GRAFT (Left Ankle)  04/05/2020  . ADENOIDECTOMY    . BACK SURGERY  2002 /2003  . CARPAL TUNNEL RELEASE Bilateral   . I & D EXTREMITY Left 01/31/2020   Procedure: IRRIGATION AND DEBRIDEMENT ANKLE;  Surgeon: Altamese [place], MD;  Location: [facility];  Service: Orthopedics;  Laterality: Left;  . I & D EXTREMITY Left 03/10/2020   Procedure: DEBRIDEMENT LEFT ANKLE, APPLY SKIN GRAFT;  Surgeon: [name], MD;  Location: [facility];  Service: Orthopedics;  Laterality: Left;  . KNEE SURGERY Left 2005  . ORIF ANKLE FRACTURE Left 01/31/2020  . ORIF ANKLE FRACTURE Left 01/31/2020   Procedure: OPEN REDUCTION INTERNAL FIXATION (ORIF) ANKLE FRACTURE;  Surgeon: Altamese , MD;  Location: [facility];  Service: Orthopedics;  Laterality: Left;  . ORIF ANKLE FRACTURE Left 04/05/2020   Procedure: INTERNAL FIXATION LEFT TIBIA TO CALCANEUS AND APPLY SKIN GRAFT;  Surgeon: [name], MD;  Location: [facility];  Service: Orthopedics;  Laterality: Left;  . SHOULDER SURGERY Left 1998  . TONSILLECTOMY     Social History   Occupational History  . Not on file  Tobacco Use  . Smoking status: Former Smoker    Packs/day: 3.00    Years: 35.00    Pack years: 105.00    Types: Cigarettes    Quit date: 04/13/2006    Years since quitting: 14.0  . Smokeless tobacco: Never Used  Vaping  Use  . Vaping Use: Never used  Substance and Sexual Activity  . Alcohol use: No    Alcohol/week: 0.0 standard drinks  . Drug use: No  . Sexual activity: Not on file

## 2020-04-30 DIAGNOSIS — Z48817 Encounter for surgical aftercare following surgery on the skin and subcutaneous tissue: Secondary | ICD-10-CM | POA: Diagnosis not present

## 2020-04-30 DIAGNOSIS — S9305XD Dislocation of left ankle joint, subsequent encounter: Secondary | ICD-10-CM | POA: Diagnosis not present

## 2020-05-04 ENCOUNTER — Ambulatory Visit (INDEPENDENT_AMBULATORY_CARE_PROVIDER_SITE_OTHER): Payer: Medicare HMO | Admitting: *Deleted

## 2020-05-04 ENCOUNTER — Other Ambulatory Visit: Payer: Self-pay

## 2020-05-04 ENCOUNTER — Ambulatory Visit (INDEPENDENT_AMBULATORY_CARE_PROVIDER_SITE_OTHER): Payer: Medicare HMO | Admitting: Physician Assistant

## 2020-05-04 ENCOUNTER — Encounter: Payer: Self-pay | Admitting: Orthopedic Surgery

## 2020-05-04 ENCOUNTER — Ambulatory Visit: Payer: Self-pay

## 2020-05-04 DIAGNOSIS — T63441D Toxic effect of venom of bees, accidental (unintentional), subsequent encounter: Secondary | ICD-10-CM

## 2020-05-04 DIAGNOSIS — S82892B Other fracture of left lower leg, initial encounter for open fracture type I or II: Secondary | ICD-10-CM

## 2020-05-04 NOTE — Progress Notes (Signed)
Office Visit Note   Patient: David Fitzgerald           Date of Birth: [birth year]           MRN: [id number] Visit Date: 05/04/2020              Requested by: Angelina Sheriff, MD [address] [place],  [place] [address] PCP: Angelina Sheriff, MD  Chief Complaint  Patient presents with  . Left Ankle - Routine Post Op    04/05/20 tib- cal fusion Kerecis skin graft      HPI: Patient presents today 4 weeks status post tibial calcaneal fusion and application of kerecis skin graft.  He has been doing daily dry dressing changes and is seeing some significant improvement.  Assessment & Plan: Visit Diagnoses:  1. Open fracture dislocation of left ankle     Plan: Patient will follow up in 1 week continue to elevate as much as possible.  Follow-Up Instructions: No follow-ups on file.   Ortho Exam  Patient is alert, oriented, no adenopathy, well-dressed, normal affect, normal respiratory effort. Examination demonstrates significant decrease in swelling no ascending cellulitis he does have combination of fibrinous and vascular tissue graft is improved since last week.  Some of the sutures were loose and removed.  No foul odor no ascending cellulitis  Imaging: No results found.    Labs: Lab Results  Component Value Date   HGBA1C 6.0 (H) 03/08/2020   HGBA1C 6.0 (H) 02/04/2020   REPTSTATUS 03/15/2020 FINAL 03/10/2020   GRAMSTAIN  03/10/2020    MODERATE WBC PRESENT, PREDOMINANTLY PMN NO ORGANISMS SEEN    CULT  03/10/2020    RARE ENTEROBACTER CLOACAE CRITICAL RESULT CALLED TO, READ BACK BY AND VERIFIED WITH: RN TGilford Rile [phone] FCP NO ANAEROBES ISOLATED Performed at [facility], [place] [address], [place], [place] [address]    [place] 03/10/2020     Lab Results  Component Value Date   ALBUMIN 2.7 (L) 03/10/2020   ALBUMIN 2.9 (L) 03/08/2020   ALBUMIN 2.9 (L) 02/16/2020    Lab Results  Component Value Date   MG 2.1 02/08/2020   MG  2.0 02/04/2020   MG 1.9 02/03/2020   Lab Results  Component Value Date   VD25OH 19.57 (L) 03/08/2020   VD25OH 16.36 (L) 02/16/2020    No results found for: PREALBUMIN CBC EXTENDED Latest Ref Rng & Units 04/06/2020 03/08/2020 02/16/2020  WBC 4.0 - 10.5 K/uL - 10.6(H) 8.4  RBC 4.22 - 5.81 MIL/uL - 4.50 4.16(L)  HGB 13.0 - 17.0 g/dL 8.9(L) 12.7(L) 12.4(L)  HCT 39.0 - 52.0 % 29.4(L) 42.0 38.6(L)  PLT 150 - 400 K/uL - 460(H) 302  NEUTROABS 1.7 - 7.7 K/uL - 7.7 -  LYMPHSABS 0.7 - 4.0 K/uL - 1.5 -     There is no height or weight on file to calculate BMI.  Orders:  Orders Placed This Encounter  Procedures  . XR Ankle Complete Left   No orders of the defined types were placed in this encounter.    Procedures: No procedures performed  Clinical Data: No additional findings.  ROS:  All other systems negative, except as noted in the HPI. Review of Systems  Objective: Vital Signs: There were no vitals taken for this visit.  Specialty Comments:  No specialty comments available.  PMFS History: Patient Active Problem List   Diagnosis Date Noted  . Left ankle pain 04/05/2020  . Open dislocation  of left ankle   . A-fib ([place])   . OSA (obstructive sleep apnea) 03/09/2020  . Wound dehiscence, traumatic injury repair, left medial ankle  03/08/2020  . Vitamin D deficiency 02/16/2020  . Chronic, continuous use of opioids 02/15/2020  . Open fracture dislocation of left ankle 01/31/2020  . Ankle fracture 01/31/2020  . Permanent atrial fibrillation ([place])   . Hypertension   . Diabetes ([place])   . Bee sting allergy   . Hypotension 12/25/2017  . Chronic anticoagulation 03/07/2017  . Coronary artery calcification seen on CT scan 02/07/2017  . CAD in native artery 02/07/2017  . Persistent atrial fibrillation ([place]) 02/06/2017  . Anxiety 02/06/2017  . Arthritis 02/06/2017  . Pernicious anemia 02/06/2017  . Allergy to pollen 04/06/2015  . OSA treated with BiPAP 12/14/2014  . Obesity  hypoventilation syndrome ([place]) 12/14/2014  . Morbid obesity with BMI of 40.0-44.9, adult ([place]) 12/14/2014  . Hypertensive heart disease 12/14/2014  . Hyperlipidemia, mixed 12/14/2014  . Type 2 diabetes mellitus with diabetic neuropathy ([place]) 12/14/2014  . Chronic venous insufficiency 12/14/2014  . Edema 12/14/2014  . Morbid obesity ([place]) 12/14/2014  . Bell's palsy 02/24/2013  . Polyneuropathy in diabetes ([place]) 02/24/2013  . Syndrome affecting cervical region 02/24/2013  . Trigeminal neuralgia 02/24/2013  . Lumbar pain with radiation down left leg 07/09/2012  . BMI 45.0-49.9, adult ([place]) 07/09/2012  . Chronic pain syndrome 07/09/2012  . Morbid obesity with BMI of 45.0-49.9, adult ([place]) 07/09/2012  . Pain syndrome, chronic 07/09/2012   Past Medical History:  Diagnosis Date  . A-fib ([place])   . Allergy to pollen 04/06/2015  . Anxiety 02/06/2017  . Arthritis 02/06/2017  . Asthma    as a child  . Bee sting allergy    wasp / mixed vespid  . Bell's palsy 02/24/2013  . Chronic venous insufficiency 12/14/2014  . Chronic, continuous use of opioids 02/15/2020  . Depression   . Diabetes ([place])   . Dysrhythmia    afib  . Edema 12/14/2014  . GERD (gastroesophageal reflux disease)   . History of kidney stones   . Hyperlipidemia, mixed 12/14/2014  . Hypertension   . Lumbar pain with radiation down left leg 07/09/2012  . Morbid obesity ([place]) 12/14/2014  . Morbid obesity with BMI of 45.0-49.9, adult ([place]) 07/09/2012  . Obesity hypoventilation syndrome ([place]) 12/14/2014  . OSA (obstructive sleep apnea) 03/09/2020   doesn't use a Cpap  . OSA treated with BiPAP 12/14/2014  . Pain syndrome, chronic 07/09/2012  . Pernicious anemia 02/06/2017  . Pneumonia   . Polyneuropathy in diabetes ([place]) 02/24/2013  . Syndrome affecting cervical region 02/24/2013  . Trigeminal neuralgia 02/24/2013  . Type 2 diabetes mellitus with diabetic neuropathy ([place]) 12/14/2014  . Vitamin D deficiency 02/16/2020  . Wound dehiscence, traumatic  injury repair, left medial ankle  03/08/2020    Family History  Problem Relation Age of Onset  . Breast cancer Mother   . Heart disease Father   . CAD Father   . Atrial fibrillation Father   . Stomach cancer Paternal Aunt   . Heart disease Paternal Uncle   . Breast cancer Maternal Grandmother     Past Surgical History:  Procedure Laterality Date  .  INTERNAL FIXATION LEFT TIBIA TO CALCANEUS AND APPLY SKIN GRAFT (Left Ankle)  04/05/2020  . ADENOIDECTOMY    . BACK SURGERY  2002 /2003  . CARPAL TUNNEL RELEASE Bilateral   . I & D EXTREMITY Left 01/31/2020   Procedure: IRRIGATION AND  DEBRIDEMENT ANKLE;  Surgeon: Altamese [place], MD;  Location: [facility];  Service: Orthopedics;  Laterality: Left;  . I & D EXTREMITY Left 03/10/2020   Procedure: DEBRIDEMENT LEFT ANKLE, APPLY SKIN GRAFT;  Surgeon: [name], MD;  Location: [facility];  Service: Orthopedics;  Laterality: Left;  . KNEE SURGERY Left 2005  . ORIF ANKLE FRACTURE Left 01/31/2020  . ORIF ANKLE FRACTURE Left 01/31/2020   Procedure: OPEN REDUCTION INTERNAL FIXATION (ORIF) ANKLE FRACTURE;  Surgeon: Altamese [place], MD;  Location: [facility];  Service: Orthopedics;  Laterality: Left;  . ORIF ANKLE FRACTURE Left 04/05/2020   Procedure: INTERNAL FIXATION LEFT TIBIA TO CALCANEUS AND APPLY SKIN GRAFT;  Surgeon: [name], MD;  Location: [facility];  Service: Orthopedics;  Laterality: Left;  . SHOULDER SURGERY Left 1998  . TONSILLECTOMY     Social History   Occupational History  . Not on file  Tobacco Use  . Smoking status: Former Smoker    Packs/day: 3.00    Years: 35.00    Pack years: 105.00    Types: Cigarettes    Quit date: 04/13/2006    Years since quitting: 14.0  . Smokeless tobacco: Never Used  Vaping Use  . Vaping Use: Never used  Substance and Sexual Activity  . Alcohol use: No    Alcohol/week: 0.0 standard drinks  . Drug use: No  . Sexual activity: Not on file

## 2020-05-08 ENCOUNTER — Other Ambulatory Visit: Payer: Self-pay

## 2020-05-08 NOTE — Patient Outreach (Signed)
[facility]) Care Management  05/08/2020  David Fitzgerald [birth year] [id number]   Telephone call to patient for follow up. No answer.  HIPAA compliant voice message left.    Plan: RN CM will attempt again within 4 business days.   [name], RN, MSN [facility] Management Care Management Coordinator Direct Line [phone] Cell [phone] Toll Free: [phone]  Fax: [phone]

## 2020-05-10 DIAGNOSIS — G4733 Obstructive sleep apnea (adult) (pediatric): Secondary | ICD-10-CM | POA: Diagnosis not present

## 2020-05-11 ENCOUNTER — Ambulatory Visit (INDEPENDENT_AMBULATORY_CARE_PROVIDER_SITE_OTHER): Payer: Medicare HMO

## 2020-05-11 ENCOUNTER — Ambulatory Visit (INDEPENDENT_AMBULATORY_CARE_PROVIDER_SITE_OTHER): Payer: Medicare HMO | Admitting: Orthopedic Surgery

## 2020-05-11 ENCOUNTER — Encounter: Payer: Self-pay | Admitting: Physician Assistant

## 2020-05-11 DIAGNOSIS — S82892B Other fracture of left lower leg, initial encounter for open fracture type I or II: Secondary | ICD-10-CM

## 2020-05-11 DIAGNOSIS — M14672 Charcot's joint, left ankle and foot: Secondary | ICD-10-CM

## 2020-05-11 NOTE — Progress Notes (Signed)
Office Visit Note   Patient: David Fitzgerald           Date of Birth: 21-Sep-1960           MRN: [id number] Visit Date: 05/11/2020              Requested by: Angelina Sheriff, MD [address] [place],  [place] [address] PCP: Angelina Sheriff, MD  Chief Complaint  Patient presents with  . Left Ankle - Pain      HPI: Patient is a 60 year old gentleman who is status post tibial calcaneal fusion on the left secondary to hardware failure from Charcot collapse internal fixation for an ankle fracture.  Patient is 5 days out from surgery he is in a cam boot patient states he does not feel like he is any better he is completing his course of antibiotics.  He is changing the dressing once a day.  Patient states that he is on oxycodone from the pain clinic.  Assessment & Plan: Visit Diagnoses:  1. Open fracture dislocation of left ankle   2. Charcot's joint, left ankle and foot     Plan: We will place him in the knee-high medical compression stocking he was given a prescription to get a size extra-large wear around-the-clock change daily wash his leg with soap and water.  Follow-up closely in 1 week.  Continue with the fracture boot and protected weightbearing.  Follow-Up Instructions: Return in about 1 week (around 05/18/2020).   Ortho Exam  Patient is alert, oriented, no adenopathy, well-dressed, normal affect, normal respiratory effort. Examination patient still has venous swelling there is no redness no cellulitis no odor or drainage.  He does have hyper granulation tissue over the medial malleolar wound that is 3 x 5 cm.  This is touched with silver nitrate.  He also has hyper granulation tissue from the entry portal from the calcaneal locking screw this was also touched with silver nitrate this was 5 mm in diameter and 3 mm proud.  A knee-high double extra-large compression sock was applied.  Patient states this felt comfortable.  Imaging: XR Ankle Complete Left  Result Date:  05/11/2020 Three-view radiographs of the left ankle shows no interval callus formation there has been compression across the fusion site that has driven the locking screw down through the calcaneus.  No images are attached to the encounter.  Labs: Lab Results  Component Value Date   HGBA1C 6.0 (H) 03/08/2020   HGBA1C 6.0 (H) 02/04/2020   REPTSTATUS 03/15/2020 FINAL 03/10/2020   GRAMSTAIN  03/10/2020    MODERATE WBC PRESENT, PREDOMINANTLY PMN NO ORGANISMS SEEN    CULT  03/10/2020    RARE ENTEROBACTER CLOACAE CRITICAL RESULT CALLED TO, READ BACK BY AND VERIFIED WITH: RN TGilford Rile [phone] FCP NO ANAEROBES ISOLATED Performed at [facility], [place] [address] [place], [place] [address]    [place] 03/10/2020     Lab Results  Component Value Date   ALBUMIN 2.7 (L) 03/10/2020   ALBUMIN 2.9 (L) 03/08/2020   ALBUMIN 2.9 (L) 02/16/2020    Lab Results  Component Value Date   MG 2.1 02/08/2020   MG 2.0 02/04/2020   MG 1.9 02/03/2020   Lab Results  Component Value Date   VD25OH 19.57 (L) 03/08/2020   VD25OH 16.36 (L) 02/16/2020    No results found for: PREALBUMIN CBC EXTENDED Latest Ref Rng & Units 04/06/2020 03/08/2020 02/16/2020  WBC 4.0 - 10.5 K/uL -  10.6(H) 8.4  RBC 4.22 - 5.81 MIL/uL - 4.50 4.16(L)  HGB 13.0 - 17.0 g/dL 8.9(L) 12.7(L) 12.4(L)  HCT 39.0 - 52.0 % 29.4(L) 42.0 38.6(L)  PLT 150 - 400 K/uL - 460(H) 302  NEUTROABS 1.7 - 7.7 K/uL - 7.7 -  LYMPHSABS 0.7 - 4.0 K/uL - 1.5 -     There is no height or weight on file to calculate BMI.  Orders:  Orders Placed This Encounter  Procedures  . XR Ankle Complete Left   No orders of the defined types were placed in this encounter.    Procedures: No procedures performed  Clinical Data: No additional findings.  ROS:  All other systems negative, except as noted in the HPI. Review of Systems  Objective: Vital Signs: There were no vitals taken for this visit.  Specialty Comments:   No specialty comments available.  PMFS History: Patient Active Problem List   Diagnosis Date Noted  . Left ankle pain 04/05/2020  . Open dislocation of left ankle   . A-fib ([place])   . OSA (obstructive sleep apnea) 03/09/2020  . Wound dehiscence, traumatic injury repair, left medial ankle  03/08/2020  . Vitamin D deficiency 02/16/2020  . Chronic, continuous use of opioids 02/15/2020  . Open fracture dislocation of left ankle 01/31/2020  . Ankle fracture 01/31/2020  . Permanent atrial fibrillation ([place])   . Hypertension   . Diabetes ([place])   . Bee sting allergy   . Hypotension 12/25/2017  . Chronic anticoagulation 03/07/2017  . Coronary artery calcification seen on CT scan 02/07/2017  . CAD in native artery 02/07/2017  . Persistent atrial fibrillation ([place]) 02/06/2017  . Anxiety 02/06/2017  . Arthritis 02/06/2017  . Pernicious anemia 02/06/2017  . Allergy to pollen 04/06/2015  . OSA treated with BiPAP 12/14/2014  . Obesity hypoventilation syndrome ([place]) 12/14/2014  . Morbid obesity with BMI of 40.0-44.9, adult ([place]) 12/14/2014  . Hypertensive heart disease 12/14/2014  . Hyperlipidemia, mixed 12/14/2014  . Type 2 diabetes mellitus with diabetic neuropathy ([place]) 12/14/2014  . Chronic venous insufficiency 12/14/2014  . Edema 12/14/2014  . Morbid obesity ([place]) 12/14/2014  . Bell's palsy 02/24/2013  . Polyneuropathy in diabetes ([place]) 02/24/2013  . Syndrome affecting cervical region 02/24/2013  . Trigeminal neuralgia 02/24/2013  . Lumbar pain with radiation down left leg 07/09/2012  . BMI 45.0-49.9, adult ([place]) 07/09/2012  . Chronic pain syndrome 07/09/2012  . Morbid obesity with BMI of 45.0-49.9, adult ([place]) 07/09/2012  . Pain syndrome, chronic 07/09/2012   Past Medical History:  Diagnosis Date  . A-fib ([place])   . Allergy to pollen 04/06/2015  . Anxiety 02/06/2017  . Arthritis 02/06/2017  . Asthma    as a child  . Bee sting allergy    wasp / mixed vespid  . Bell's palsy  02/24/2013  . Chronic venous insufficiency 12/14/2014  . Chronic, continuous use of opioids 02/15/2020  . Depression   . Diabetes ([place])   . Dysrhythmia    afib  . Edema 12/14/2014  . GERD (gastroesophageal reflux disease)   . History of kidney stones   . Hyperlipidemia, mixed 12/14/2014  . Hypertension   . Lumbar pain with radiation down left leg 07/09/2012  . Morbid obesity ([place]) 12/14/2014  . Morbid obesity with BMI of 45.0-49.9, adult ([place]) 07/09/2012  . Obesity hypoventilation syndrome ([place]) 12/14/2014  . OSA (obstructive sleep apnea) 03/09/2020   doesn't use a Cpap  . OSA treated with BiPAP 12/14/2014  . Pain syndrome, chronic 07/09/2012  .  Pernicious anemia 02/06/2017  . Pneumonia   . Polyneuropathy in diabetes ([place]) 02/24/2013  . Syndrome affecting cervical region 02/24/2013  . Trigeminal neuralgia 02/24/2013  . Type 2 diabetes mellitus with diabetic neuropathy ([place]) 12/14/2014  . Vitamin D deficiency 02/16/2020  . Wound dehiscence, traumatic injury repair, left medial ankle  03/08/2020    Family History  Problem Relation Age of Onset  . Breast cancer Mother   . Heart disease Father   . CAD Father   . Atrial fibrillation Father   . Stomach cancer Paternal Aunt   . Heart disease Paternal Uncle   . Breast cancer Maternal Grandmother     Past Surgical History:  Procedure Laterality Date  .  INTERNAL FIXATION LEFT TIBIA TO CALCANEUS AND APPLY SKIN GRAFT (Left Ankle)  04/05/2020  . ADENOIDECTOMY    . BACK SURGERY  2002 /2003  . CARPAL TUNNEL RELEASE Bilateral   . I & D EXTREMITY Left 01/31/2020   Procedure: IRRIGATION AND DEBRIDEMENT ANKLE;  Surgeon: Altamese Henagar, MD;  Location: [facility];  Service: Orthopedics;  Laterality: Left;  . I & D EXTREMITY Left 03/10/2020   Procedure: DEBRIDEMENT LEFT ANKLE, APPLY SKIN GRAFT;  Surgeon: [name], MD;  Location: [facility];  Service: Orthopedics;  Laterality: Left;  . KNEE SURGERY Left 2005  . ORIF ANKLE FRACTURE Left 01/31/2020  . ORIF ANKLE  FRACTURE Left 01/31/2020   Procedure: OPEN REDUCTION INTERNAL FIXATION (ORIF) ANKLE FRACTURE;  Surgeon: Altamese Daggett, MD;  Location: [facility];  Service: Orthopedics;  Laterality: Left;  . ORIF ANKLE FRACTURE Left 04/05/2020   Procedure: INTERNAL FIXATION LEFT TIBIA TO CALCANEUS AND APPLY SKIN GRAFT;  Surgeon: [name], MD;  Location: [facility];  Service: Orthopedics;  Laterality: Left;  . SHOULDER SURGERY Left 1998  . TONSILLECTOMY     Social History   Occupational History  . Not on file  Tobacco Use  . Smoking status: Former Smoker    Packs/day: 3.00    Years: 35.00    Pack years: 105.00    Types: Cigarettes    Quit date: 04/13/2006    Years since quitting: 14.0  . Smokeless tobacco: Never Used  Vaping Use  . Vaping Use: Never used  Substance and Sexual Activity  . Alcohol use: No    Alcohol/week: 0.0 standard drinks  . Drug use: No  . Sexual activity: Not on file

## 2020-05-12 ENCOUNTER — Other Ambulatory Visit: Payer: Self-pay

## 2020-05-12 NOTE — Patient Instructions (Signed)
Goals Addressed            This Visit's Progress   . Careful Skin Care- Left ankle area   On track    Timeframe:  Short-Term Goal Priority:  High Start Date:   04/13/20                          Expected End Date:    07/04/20                   Follow Up Date  06/03/20  Provide wound care daily. Monitor for signs of redness, increased pain, drainage, or swelling to area       Notes: Patient currently has wound vac in place with follow up scheduled.   04/25/20 Wound vac removed. 05/12/20 Patient only has small area open. Using compression sock.     . Monitor and Manage My Blood Sugar-Diabetes Type 2   On track    Timeframe:  Long-Range Goal Priority:  High Start Date:   04/13/20                          Expected End Date:    10/04/20                   Follow Up Date 06/03/20   - check blood sugar at prescribed times - take the blood sugar log to all doctor visits    Why is this important?    Checking your blood sugar at home helps to keep it from getting very high or very low.   Writing the results in a diary or log helps the doctor know how to care for you.   Your blood sugar log should have the time, date and the results.   Also, write down the amount of insulin or other medicine that you take.   Other information, like what you ate, exercise done and how you were feeling, will also be helpful.     Notes: Keep up the great work with your sugars! 05/12/20 Continue to monitor sugars and limit carbohydrates.

## 2020-05-12 NOTE — Patient Outreach (Signed)
[facility]) Care Management  [place]  05/12/2020   David Fitzgerald 01-03-1961 [id number]  Subjective: Telephone call to patient for follow up. He states he is doing pretty good.  He states that ankle is healing slow per physician but outside about healed up. He is now is a compression sock and washing area daily.  He states blood sugars remain in good control. Encouraged to keep up the great work.  He verbalized understanding.   Objective:   Encounter Medications:  Outpatient Encounter Medications as of 05/12/2020  Medication Sig Note  . Azelastine-Fluticasone 137-50 MCG/ACT SUSP Place 2 sprays into both nostrils daily as needed (Allergies).   . Cholecalciferol (VITAMIN D) 125 MCG (5000 UT) CAPS Take 1 capsule by mouth daily. (Patient taking differently: Take 5,000 Units by mouth daily.)   . dicyclomine (BENTYL) 20 MG tablet Take 20 mg by mouth 2 (two) times daily.   Marland Kitchen docusate sodium (COLACE) 100 MG capsule Take 1 capsule (100 mg total) by mouth 2 (two) times daily. (Patient taking differently: Take 100 mg by mouth daily as needed for mild constipation.)   . DULoxetine (CYMBALTA) 60 MG capsule Take 60 mg by mouth daily.    Marland Kitchen ELIQUIS 5 MG TABS tablet TAKE 1 TABLET BY MOUTH TWICE DAILY (Patient taking differently: Take 5 mg by mouth 2 (two) times daily.)   . EPINEPHrine 0.3 mg/0.3 mL IJ SOAJ injection Use for life-threatening allergic reactions (Patient taking differently: Inject 0.3 mg into the muscle as needed (for life-threatening allergic reactions).)   . furosemide (LASIX) 40 MG tablet Take 40 mg by mouth daily as needed for fluid or edema.   . gabapentin (NEURONTIN) 300 MG capsule Take 600 mg by mouth 3 (three) times daily.    . metFORMIN (GLUCOPHAGE) 500 MG tablet Take 500 mg by mouth daily with breakfast.   . metoprolol tartrate (LOPRESSOR) 25 MG tablet Take 1 tablet (25 mg total) by mouth at bedtime. (Patient taking differently: Take 25-50 mg by mouth at  bedtime. Take 50 mg in the morning and 25 mg at bedtime) 03/08/2020: The patient takes TWO strengths of Metoprolol Tartrate  . metoprolol tartrate (LOPRESSOR) 50 MG tablet Take 1 tablet (50 mg total) by mouth daily. 03/08/2020: The patient takes TWO strengths of Metoprolol Tartrate  . naloxone (NARCAN) nasal spray 4 mg/0.1 mL Place 1 spray into the nose See admin instructions. OPIOID OVERDOSE EMERGENCY   . naproxen sodium (ALEVE) 220 MG tablet Take 440 mg by mouth daily as needed (for headaches).   . nitroGLYCERIN (NITROSTAT) 0.4 MG SL tablet Place 0.4 mg under the tongue every 5 (five) minutes as needed for chest pain.   Marland Kitchen ondansetron (ZOFRAN-ODT) 4 MG disintegrating tablet Take 4 mg by mouth every 8 (eight) hours as needed for nausea or vomiting.   Marland Kitchen oxyCODONE-acetaminophen (PERCOCET) 5-325 MG tablet Take 1 tablet by mouth every 4 (four) hours as needed.   . pantoprazole (PROTONIX) 40 MG tablet Take 40 mg by mouth daily.   . silver sulfADIAZINE (SILVADENE) 1 % cream Apply 1 application topically daily. Apply to affected area daily plus dry dressing   . simvastatin (ZOCOR) 20 MG tablet Take 20 mg by mouth at bedtime.   . traMADol (ULTRAM) 50 MG tablet Take 1 tablet (50 mg total) by mouth every 6 (six) hours as needed for moderate pain or severe pain.   . vitamin B-12 1000 MCG tablet Take 1 tablet (1,000 mcg total) by mouth daily.  No facility-administered encounter medications on file as of 05/12/2020.    Functional Status:  In your present state of health, do you have any difficulty performing the following activities: 04/13/2020 04/05/2020  Hearing? N -  Vision? N -  Difficulty concentrating or making decisions? N -  Walking or climbing stairs? Y -  Comment ankle injury -  Dressing or bathing? N -  Doing errands, shopping? N N  Preparing Food and eating ? N -  Using the Toilet? N -  In the past six months, have you accidently leaked urine? N -  Do you have problems with loss of bowel control?  N -  Managing your Medications? N -  Managing your Finances? N -  Housekeeping or managing your Housekeeping? N -  Some recent data might be hidden    Fall/Depression Screening: Fall Risk  04/13/2020  Falls in the past year? 1  Number falls in past yr: 0  Injury with Fall? 1  Risk for fall due to : History of fall(s)  Follow up Falls prevention discussed   PHQ 2/9 Scores 04/13/2020 03/15/2020  PHQ - 2 Score 0 0    Assessment:  Goals Addressed            This Visit's Progress   . Careful Skin Care- Left ankle area   On track    Timeframe:  Short-Term Goal Priority:  High Start Date:   04/13/20                          Expected End Date:    07/04/20                   Follow Up Date  06/03/20  Provide wound care daily. Monitor for signs of redness, increased pain, drainage, or swelling to area       Notes: Patient currently has wound vac in place with follow up scheduled.   04/25/20 Wound vac removed. 05/12/20 Patient only has small area open. Using compression sock.     . Monitor and Manage My Blood Sugar-Diabetes Type 2   On track    Timeframe:  Long-Range Goal Priority:  High Start Date:   04/13/20                          Expected End Date:    10/04/20                   Follow Up Date 06/03/20   - check blood sugar at prescribed times - take the blood sugar log to all doctor visits    Why is this important?    Checking your blood sugar at home helps to keep it from getting very high or very low.   Writing the results in a diary or log helps the doctor know how to care for you.   Your blood sugar log should have the time, date and the results.   Also, write down the amount of insulin or other medicine that you take.   Other information, like what you ate, exercise done and how you were feeling, will also be helpful.     Notes: Keep up the great work with your sugars! 05/12/20 Continue to monitor sugars and limit carbohydrates.         Plan: RN CM will contact  again this month.   Follow-up:  Patient agrees to Care Plan and Follow-up.  [name], RN, MSN [facility] Care Management Coordinator Direct Line [phone] Cell [phone] Toll Free: [phone]  Fax: [phone]

## 2020-05-16 DIAGNOSIS — Z6841 Body Mass Index (BMI) 40.0 and over, adult: Secondary | ICD-10-CM | POA: Diagnosis not present

## 2020-05-16 DIAGNOSIS — I10 Essential (primary) hypertension: Secondary | ICD-10-CM | POA: Diagnosis not present

## 2020-05-16 DIAGNOSIS — E7800 Pure hypercholesterolemia, unspecified: Secondary | ICD-10-CM | POA: Diagnosis not present

## 2020-05-16 DIAGNOSIS — E7439 Other disorders of intestinal carbohydrate absorption: Secondary | ICD-10-CM | POA: Diagnosis not present

## 2020-05-18 ENCOUNTER — Ambulatory Visit (INDEPENDENT_AMBULATORY_CARE_PROVIDER_SITE_OTHER): Payer: Medicare HMO | Admitting: Orthopedic Surgery

## 2020-05-18 DIAGNOSIS — M14672 Charcot's joint, left ankle and foot: Secondary | ICD-10-CM

## 2020-05-18 DIAGNOSIS — S82892B Other fracture of left lower leg, initial encounter for open fracture type I or II: Secondary | ICD-10-CM

## 2020-05-22 ENCOUNTER — Encounter: Payer: Self-pay | Admitting: Orthopedic Surgery

## 2020-05-22 DIAGNOSIS — M47816 Spondylosis without myelopathy or radiculopathy, lumbar region: Secondary | ICD-10-CM | POA: Diagnosis not present

## 2020-05-22 DIAGNOSIS — M1612 Unilateral primary osteoarthritis, left hip: Secondary | ICD-10-CM | POA: Diagnosis not present

## 2020-05-22 DIAGNOSIS — Z79891 Long term (current) use of opiate analgesic: Secondary | ICD-10-CM | POA: Diagnosis not present

## 2020-05-22 DIAGNOSIS — M5136 Other intervertebral disc degeneration, lumbar region: Secondary | ICD-10-CM | POA: Diagnosis not present

## 2020-05-22 DIAGNOSIS — M47818 Spondylosis without myelopathy or radiculopathy, sacral and sacrococcygeal region: Secondary | ICD-10-CM | POA: Diagnosis not present

## 2020-05-22 DIAGNOSIS — M25552 Pain in left hip: Secondary | ICD-10-CM | POA: Diagnosis not present

## 2020-05-22 DIAGNOSIS — G894 Chronic pain syndrome: Secondary | ICD-10-CM | POA: Diagnosis not present

## 2020-05-22 NOTE — Progress Notes (Signed)
Office Visit Note   Patient: David Fitzgerald           Date of Birth: [birth year]           MRN: [id number] Visit Date: 05/18/2020              Requested by: Angelina Sheriff, MD [address] [place],  [place] [address] PCP: Angelina Sheriff, MD  Chief Complaint  Patient presents with  . Left Ankle - Pain      HPI: Patient is a 60 year old gentleman who is status post Charcot collapse of an open reduction internal fixation ankle fracture subsequent tibial calcaneal fusion.  Patient had kerecis skin graft applied.  He is currently not on antibiotics he states he still has some bloody drainage.  Assessment & Plan: Visit Diagnoses:  1. Open fracture dislocation of left ankle   2. Charcot's joint, left ankle and foot     Plan: Continue with the compression sock continue with the fracture boot.  Resume the sock tomorrow.  Repeat 2 view radiographs of the left ankle at follow-up.  Follow-Up Instructions: Return in about 2 weeks (around 06/01/2020).   Ortho Exam  Patient is alert, oriented, no adenopathy, well-dressed, normal affect, normal respiratory effort. Examination patient has healthy hypergranulation tissue over the medial malleolus this was touched with silver nitrate and Iodosorb dressing was applied.  There is no surrounding cellulitis.  Sutures were harvested.  Imaging: No results found.     Labs: Lab Results  Component Value Date   HGBA1C 6.0 (H) 03/08/2020   HGBA1C 6.0 (H) 02/04/2020   REPTSTATUS 03/15/2020 FINAL 03/10/2020   GRAMSTAIN  03/10/2020    MODERATE WBC PRESENT, PREDOMINANTLY PMN NO ORGANISMS SEEN    CULT  03/10/2020    RARE ENTEROBACTER CLOACAE CRITICAL RESULT CALLED TO, READ BACK BY AND VERIFIED WITH: RN TGilford Rile [phone] FCP NO ANAEROBES ISOLATED Performed at [facility], [place] [address], [place], [place] [address]    [place] 03/10/2020     Lab Results  Component Value Date   ALBUMIN 2.7 (L)  03/10/2020   ALBUMIN 2.9 (L) 03/08/2020   ALBUMIN 2.9 (L) 02/16/2020    Lab Results  Component Value Date   MG 2.1 02/08/2020   MG 2.0 02/04/2020   MG 1.9 02/03/2020   Lab Results  Component Value Date   VD25OH 19.57 (L) 03/08/2020   VD25OH 16.36 (L) 02/16/2020    No results found for: PREALBUMIN CBC EXTENDED Latest Ref Rng & Units 04/06/2020 03/08/2020 02/16/2020  WBC 4.0 - 10.5 K/uL - 10.6(H) 8.4  RBC 4.22 - 5.81 MIL/uL - 4.50 4.16(L)  HGB 13.0 - 17.0 g/dL 8.9(L) 12.7(L) 12.4(L)  HCT 39.0 - 52.0 % 29.4(L) 42.0 38.6(L)  PLT 150 - 400 K/uL - 460(H) 302  NEUTROABS 1.7 - 7.7 K/uL - 7.7 -  LYMPHSABS 0.7 - 4.0 K/uL - 1.5 -     There is no height or weight on file to calculate BMI.  Orders:  No orders of the defined types were placed in this encounter.  No orders of the defined types were placed in this encounter.    Procedures: No procedures performed  Clinical Data: No additional findings.  ROS:  All other systems negative, except as noted in the HPI. Review of Systems  Objective: Vital Signs: There were no vitals taken for this visit.  Specialty Comments:  No specialty comments available.  PMFS History: Patient Active Problem  List   Diagnosis Date Noted  . Left ankle pain 04/05/2020  . Open dislocation of left ankle   . A-fib ([place])   . OSA (obstructive sleep apnea) 03/09/2020  . Wound dehiscence, traumatic injury repair, left medial ankle  03/08/2020  . Vitamin D deficiency 02/16/2020  . Chronic, continuous use of opioids 02/15/2020  . Open fracture dislocation of left ankle 01/31/2020  . Ankle fracture 01/31/2020  . Permanent atrial fibrillation ([place])   . Hypertension   . Diabetes ([place])   . Bee sting allergy   . Hypotension 12/25/2017  . Chronic anticoagulation 03/07/2017  . Coronary artery calcification seen on CT scan 02/07/2017  . CAD in native artery 02/07/2017  . Persistent atrial fibrillation ([place]) 02/06/2017  . Anxiety 02/06/2017  .  Arthritis 02/06/2017  . Pernicious anemia 02/06/2017  . Allergy to pollen 04/06/2015  . OSA treated with BiPAP 12/14/2014  . Obesity hypoventilation syndrome ([place]) 12/14/2014  . Morbid obesity with BMI of 40.0-44.9, adult ([place]) 12/14/2014  . Hypertensive heart disease 12/14/2014  . Hyperlipidemia, mixed 12/14/2014  . Type 2 diabetes mellitus with diabetic neuropathy ([place]) 12/14/2014  . Chronic venous insufficiency 12/14/2014  . Edema 12/14/2014  . Morbid obesity ([place]) 12/14/2014  . Bell's palsy 02/24/2013  . Polyneuropathy in diabetes ([place]) 02/24/2013  . Syndrome affecting cervical region 02/24/2013  . Trigeminal neuralgia 02/24/2013  . Lumbar pain with radiation down left leg 07/09/2012  . BMI 45.0-49.9, adult ([place]) 07/09/2012  . Chronic pain syndrome 07/09/2012  . Morbid obesity with BMI of 45.0-49.9, adult ([place]) 07/09/2012  . Pain syndrome, chronic 07/09/2012   Past Medical History:  Diagnosis Date  . A-fib ([place])   . Allergy to pollen 04/06/2015  . Anxiety 02/06/2017  . Arthritis 02/06/2017  . Asthma    as a child  . Bee sting allergy    wasp / mixed vespid  . Bell's palsy 02/24/2013  . Chronic venous insufficiency 12/14/2014  . Chronic, continuous use of opioids 02/15/2020  . Depression   . Diabetes ([place])   . Dysrhythmia    afib  . Edema 12/14/2014  . GERD (gastroesophageal reflux disease)   . History of kidney stones   . Hyperlipidemia, mixed 12/14/2014  . Hypertension   . Lumbar pain with radiation down left leg 07/09/2012  . Morbid obesity ([place]) 12/14/2014  . Morbid obesity with BMI of 45.0-49.9, adult ([place]) 07/09/2012  . Obesity hypoventilation syndrome ([place]) 12/14/2014  . OSA (obstructive sleep apnea) 03/09/2020   doesn't use a Cpap  . OSA treated with BiPAP 12/14/2014  . Pain syndrome, chronic 07/09/2012  . Pernicious anemia 02/06/2017  . Pneumonia   . Polyneuropathy in diabetes ([place]) 02/24/2013  . Syndrome affecting cervical region 02/24/2013  . Trigeminal neuralgia 02/24/2013   . Type 2 diabetes mellitus with diabetic neuropathy ([place]) 12/14/2014  . Vitamin D deficiency 02/16/2020  . Wound dehiscence, traumatic injury repair, left medial ankle  03/08/2020    Family History  Problem Relation Age of Onset  . Breast cancer Mother   . Heart disease Father   . CAD Father   . Atrial fibrillation Father   . Stomach cancer Paternal Aunt   . Heart disease Paternal Uncle   . Breast cancer Maternal Grandmother     Past Surgical History:  Procedure Laterality Date  .  INTERNAL FIXATION LEFT TIBIA TO CALCANEUS AND APPLY SKIN GRAFT (Left Ankle)  04/05/2020  . ADENOIDECTOMY    . BACK SURGERY  2002 /2003  . CARPAL TUNNEL  RELEASE Bilateral   . I & D EXTREMITY Left 01/31/2020   Procedure: IRRIGATION AND DEBRIDEMENT ANKLE;  Surgeon: Altamese [place], MD;  Location: [facility];  Service: Orthopedics;  Laterality: Left;  . I & D EXTREMITY Left 03/10/2020   Procedure: DEBRIDEMENT LEFT ANKLE, APPLY SKIN GRAFT;  Surgeon: [name], MD;  Location: [facility];  Service: Orthopedics;  Laterality: Left;  . KNEE SURGERY Left 2005  . ORIF ANKLE FRACTURE Left 01/31/2020  . ORIF ANKLE FRACTURE Left 01/31/2020   Procedure: OPEN REDUCTION INTERNAL FIXATION (ORIF) ANKLE FRACTURE;  Surgeon: Altamese [place], MD;  Location: [facility];  Service: Orthopedics;  Laterality: Left;  . ORIF ANKLE FRACTURE Left 04/05/2020   Procedure: INTERNAL FIXATION LEFT TIBIA TO CALCANEUS AND APPLY SKIN GRAFT;  Surgeon: [name], MD;  Location: [facility];  Service: Orthopedics;  Laterality: Left;  . SHOULDER SURGERY Left 1998  . TONSILLECTOMY     Social History   Occupational History  . Not on file  Tobacco Use  . Smoking status: Former Smoker    Packs/day: 3.00    Years: 35.00    Pack years: 105.00    Types: Cigarettes    Quit date: 04/13/2006    Years since quitting: 14.1  . Smokeless tobacco: Never Used  Vaping Use  . Vaping Use: Never used  Substance and Sexual Activity  . Alcohol use: No    Alcohol/week: 0.0  standard drinks  . Drug use: No  . Sexual activity: Not on file

## 2020-05-31 ENCOUNTER — Other Ambulatory Visit: Payer: Self-pay

## 2020-05-31 DIAGNOSIS — S9305XD Dislocation of left ankle joint, subsequent encounter: Secondary | ICD-10-CM | POA: Diagnosis not present

## 2020-05-31 DIAGNOSIS — Z48817 Encounter for surgical aftercare following surgery on the skin and subcutaneous tissue: Secondary | ICD-10-CM | POA: Diagnosis not present

## 2020-05-31 NOTE — Patient Outreach (Signed)
[facility]) Care Management  05/31/2020  WOFFORD STRATTON Jul 28, 1960 [id number]   Telephone call to patient for follow up.  No answer. HIPAA compliant voice message left.   Plan: RN CM will attempt patient again in the month of May.   [name], RN, MSN [place] Management Care Management Coordinator Direct Line [phone] Cell [phone] Toll Free: [phone]  Fax: [phone]

## 2020-06-01 ENCOUNTER — Ambulatory Visit (INDEPENDENT_AMBULATORY_CARE_PROVIDER_SITE_OTHER): Payer: Medicare HMO | Admitting: Orthopedic Surgery

## 2020-06-01 ENCOUNTER — Encounter: Payer: Self-pay | Admitting: Orthopedic Surgery

## 2020-06-01 ENCOUNTER — Ambulatory Visit (INDEPENDENT_AMBULATORY_CARE_PROVIDER_SITE_OTHER): Payer: Medicare HMO

## 2020-06-01 DIAGNOSIS — S82892B Other fracture of left lower leg, initial encounter for open fracture type I or II: Secondary | ICD-10-CM

## 2020-06-01 NOTE — Progress Notes (Signed)
Office Visit Note   Patient: David Fitzgerald           Date of Birth: Feb 03, 1961           MRN: [id number] Visit Date: 06/01/2020              Requested by: Angelina Sheriff, MD [address] [place],  [place] [address] PCP: Angelina Sheriff, MD  Chief Complaint  Patient presents with  . Left Ankle - Routine Post Op    04/25/20 ORIF left ankle fx tib- cal fusion kerecis graft       HPI: Patient is a 60 year old gentleman who presents 7 weeks status post tibial calcaneal fusion and skin graft for a failed internal fixation ankle fracture.  Patient is wearing the compression stocking is nonweightbearing in a fracture boot.  He states he felt a pop in his ankle when the grandson was pushing on his ankle.  Assessment & Plan: Visit Diagnoses:  1. Open fracture dislocation of left ankle     Plan: Patient will continue nonweightbearing for 4 weeks continue with the compression sock and the fracture boot.  Repeat three-view radiographs of the left ankle at follow-up.  Follow-Up Instructions: Return in about 4 weeks (around 06/29/2020).   Ortho Exam  Patient is alert, oriented, no adenopathy, well-dressed, normal affect, normal respiratory effort. Examination patient still has venous swelling but there is no redness no cellulitis no signs of infection there is hypergranulation tissue over the medial ankle wound this was touched with silver nitrate the wound is now 2 x 3 cm and healing quite rapidly with healthy granulation tissue.  A new compression stocking was applied.  There is no tenderness to palpation no drainage no cellulitis no signs of infection.  Imaging: XR Ankle Complete Left  Result Date: 06/01/2020 Three-view radiographs of the left ankle shows increased consolidation of the fracture site.  There has been superior migration of the proximal locking screw and distal migration of the distal locking screw.  No hardware failure.      Labs: Lab Results  Component  Value Date   HGBA1C 6.0 (H) 03/08/2020   HGBA1C 6.0 (H) 02/04/2020   REPTSTATUS 03/15/2020 FINAL 03/10/2020   GRAMSTAIN  03/10/2020    MODERATE WBC PRESENT, PREDOMINANTLY PMN NO ORGANISMS SEEN    CULT  03/10/2020    RARE ENTEROBACTER CLOACAE CRITICAL RESULT CALLED TO, READ BACK BY AND VERIFIED WITH: RN TGilford Rile [phone] FCP NO ANAEROBES ISOLATED Performed at [facility], [place] [address] [place], [place] [address]    [place] 03/10/2020     Lab Results  Component Value Date   ALBUMIN 2.7 (L) 03/10/2020   ALBUMIN 2.9 (L) 03/08/2020   ALBUMIN 2.9 (L) 02/16/2020    Lab Results  Component Value Date   MG 2.1 02/08/2020   MG 2.0 02/04/2020   MG 1.9 02/03/2020   Lab Results  Component Value Date   VD25OH 19.57 (L) 03/08/2020   VD25OH 16.36 (L) 02/16/2020    No results found for: PREALBUMIN CBC EXTENDED Latest Ref Rng & Units 04/06/2020 03/08/2020 02/16/2020  WBC 4.0 - 10.5 K/uL - 10.6(H) 8.4  RBC 4.22 - 5.81 MIL/uL - 4.50 4.16(L)  HGB 13.0 - 17.0 g/dL 8.9(L) 12.7(L) 12.4(L)  HCT 39.0 - 52.0 % 29.4(L) 42.0 38.6(L)  PLT 150 - 400 K/uL - 460(H) 302  NEUTROABS 1.7 - 7.7 K/uL - 7.7 -  LYMPHSABS 0.7 - 4.0 K/uL -  1.5 -     There is no height or weight on file to calculate BMI.  Orders:  Orders Placed This Encounter  Procedures  . XR Ankle Complete Left   No orders of the defined types were placed in this encounter.    Procedures: No procedures performed  Clinical Data: No additional findings.  ROS:  All other systems negative, except as noted in the HPI. Review of Systems  Objective: Vital Signs: There were no vitals taken for this visit.  Specialty Comments:  No specialty comments available.  PMFS History: Patient Active Problem List   Diagnosis Date Noted  . Left ankle pain 04/05/2020  . Open dislocation of left ankle   . A-fib ([place])   . OSA (obstructive sleep apnea) 03/09/2020  . Wound dehiscence, traumatic injury  repair, left medial ankle  03/08/2020  . Vitamin D deficiency 02/16/2020  . Chronic, continuous use of opioids 02/15/2020  . Open fracture dislocation of left ankle 01/31/2020  . Ankle fracture 01/31/2020  . Permanent atrial fibrillation ([place])   . Hypertension   . Diabetes ([place])   . Bee sting allergy   . Hypotension 12/25/2017  . Chronic anticoagulation 03/07/2017  . Coronary artery calcification seen on CT scan 02/07/2017  . CAD in native artery 02/07/2017  . Persistent atrial fibrillation ([place]) 02/06/2017  . Anxiety 02/06/2017  . Arthritis 02/06/2017  . Pernicious anemia 02/06/2017  . Allergy to pollen 04/06/2015  . OSA treated with BiPAP 12/14/2014  . Obesity hypoventilation syndrome ([place]) 12/14/2014  . Morbid obesity with BMI of 40.0-44.9, adult ([place]) 12/14/2014  . Hypertensive heart disease 12/14/2014  . Hyperlipidemia, mixed 12/14/2014  . Type 2 diabetes mellitus with diabetic neuropathy ([place]) 12/14/2014  . Chronic venous insufficiency 12/14/2014  . Edema 12/14/2014  . Morbid obesity ([place]) 12/14/2014  . Bell's palsy 02/24/2013  . Polyneuropathy in diabetes ([place]) 02/24/2013  . Syndrome affecting cervical region 02/24/2013  . Trigeminal neuralgia 02/24/2013  . Lumbar pain with radiation down left leg 07/09/2012  . BMI 45.0-49.9, adult ([place]) 07/09/2012  . Chronic pain syndrome 07/09/2012  . Morbid obesity with BMI of 45.0-49.9, adult ([place]) 07/09/2012  . Pain syndrome, chronic 07/09/2012   Past Medical History:  Diagnosis Date  . A-fib ([place])   . Allergy to pollen 04/06/2015  . Anxiety 02/06/2017  . Arthritis 02/06/2017  . Asthma    as a child  . Bee sting allergy    wasp / mixed vespid  . Bell's palsy 02/24/2013  . Chronic venous insufficiency 12/14/2014  . Chronic, continuous use of opioids 02/15/2020  . Depression   . Diabetes ([place])   . Dysrhythmia    afib  . Edema 12/14/2014  . GERD (gastroesophageal reflux disease)   . History of kidney stones   . Hyperlipidemia,  mixed 12/14/2014  . Hypertension   . Lumbar pain with radiation down left leg 07/09/2012  . Morbid obesity ([place]) 12/14/2014  . Morbid obesity with BMI of 45.0-49.9, adult ([place]) 07/09/2012  . Obesity hypoventilation syndrome ([place]) 12/14/2014  . OSA (obstructive sleep apnea) 03/09/2020   doesn't use a Cpap  . OSA treated with BiPAP 12/14/2014  . Pain syndrome, chronic 07/09/2012  . Pernicious anemia 02/06/2017  . Pneumonia   . Polyneuropathy in diabetes ([place]) 02/24/2013  . Syndrome affecting cervical region 02/24/2013  . Trigeminal neuralgia 02/24/2013  . Type 2 diabetes mellitus with diabetic neuropathy ([place]) 12/14/2014  . Vitamin D deficiency 02/16/2020  . Wound dehiscence, traumatic injury repair, left medial ankle  03/08/2020    Family History  Problem Relation Age of Onset  . Breast cancer Mother   . Heart disease Father   . CAD Father   . Atrial fibrillation Father   . Stomach cancer Paternal Aunt   . Heart disease Paternal Uncle   . Breast cancer Maternal Grandmother     Past Surgical History:  Procedure Laterality Date  .  INTERNAL FIXATION LEFT TIBIA TO CALCANEUS AND APPLY SKIN GRAFT (Left Ankle)  04/05/2020  . ADENOIDECTOMY    . BACK SURGERY  2002 /2003  . CARPAL TUNNEL RELEASE Bilateral   . I & D EXTREMITY Left 01/31/2020   Procedure: IRRIGATION AND DEBRIDEMENT ANKLE;  Surgeon: Altamese [place], MD;  Location: [facility];  Service: Orthopedics;  Laterality: Left;  . I & D EXTREMITY Left 03/10/2020   Procedure: DEBRIDEMENT LEFT ANKLE, APPLY SKIN GRAFT;  Surgeon: [name], MD;  Location: [facility];  Service: Orthopedics;  Laterality: Left;  . KNEE SURGERY Left 2005  . ORIF ANKLE FRACTURE Left 01/31/2020  . ORIF ANKLE FRACTURE Left 01/31/2020   Procedure: OPEN REDUCTION INTERNAL FIXATION (ORIF) ANKLE FRACTURE;  Surgeon: Altamese , MD;  Location: [facility];  Service: Orthopedics;  Laterality: Left;  . ORIF ANKLE FRACTURE Left 04/05/2020   Procedure: INTERNAL FIXATION LEFT TIBIA TO CALCANEUS AND  APPLY SKIN GRAFT;  Surgeon: [name], MD;  Location: [facility];  Service: Orthopedics;  Laterality: Left;  . SHOULDER SURGERY Left 1998  . TONSILLECTOMY     Social History   Occupational History  . Not on file  Tobacco Use  . Smoking status: Former Smoker    Packs/day: 3.00    Years: 35.00    Pack years: 105.00    Types: Cigarettes    Quit date: 04/13/2006    Years since quitting: 14.1  . Smokeless tobacco: Never Used  Vaping Use  . Vaping Use: Never used  Substance and Sexual Activity  . Alcohol use: No    Alcohol/week: 0.0 standard drinks  . Drug use: No  . Sexual activity: Not on file

## 2020-06-09 ENCOUNTER — Other Ambulatory Visit: Payer: Self-pay

## 2020-06-09 NOTE — Patient Outreach (Signed)
[facility]) Care Management  06/09/2020  David Fitzgerald Sep 10, 1960 [id number]   Telephone call to patient for follow up.  Male answered stating patient not in. CM contact information given for return call.    Plan: RN CM will wait return call. If no return call will attempt again next week and send letter.   [name], RN, MSN [place] Management Care Management Coordinator Direct Line [phone] Cell [phone] Toll Free: [phone]  Fax: [phone]

## 2020-06-12 ENCOUNTER — Other Ambulatory Visit: Payer: Self-pay

## 2020-06-12 NOTE — Patient Outreach (Signed)
[facility]) Care Management  06/12/2020  DEUNTAE KOCSIS 12-Jan-1961 [id number]   Telephone call to patient for follow up. No answer.  HIPAA compliant voice message left.    Plan: RN CM will attempt patient again in the month of May.   [name], RN, MSN [place] Management Care Management Coordinator Direct Line [phone] Cell [phone] Toll Free: [phone]  Fax: [phone]

## 2020-06-14 ENCOUNTER — Other Ambulatory Visit: Payer: Self-pay | Admitting: Cardiology

## 2020-06-14 NOTE — Telephone Encounter (Signed)
15m, 165.6kg, scr 1.1 04/05/20, lovw/munley 03/30/20

## 2020-06-22 DIAGNOSIS — Z79891 Long term (current) use of opiate analgesic: Secondary | ICD-10-CM | POA: Diagnosis not present

## 2020-06-22 DIAGNOSIS — M47816 Spondylosis without myelopathy or radiculopathy, lumbar region: Secondary | ICD-10-CM | POA: Diagnosis not present

## 2020-06-22 DIAGNOSIS — M5136 Other intervertebral disc degeneration, lumbar region: Secondary | ICD-10-CM | POA: Diagnosis not present

## 2020-06-22 DIAGNOSIS — M1612 Unilateral primary osteoarthritis, left hip: Secondary | ICD-10-CM | POA: Diagnosis not present

## 2020-06-22 DIAGNOSIS — M47818 Spondylosis without myelopathy or radiculopathy, sacral and sacrococcygeal region: Secondary | ICD-10-CM | POA: Diagnosis not present

## 2020-06-22 DIAGNOSIS — M25552 Pain in left hip: Secondary | ICD-10-CM | POA: Diagnosis not present

## 2020-06-22 DIAGNOSIS — G894 Chronic pain syndrome: Secondary | ICD-10-CM | POA: Diagnosis not present

## 2020-06-26 ENCOUNTER — Telehealth: Payer: Self-pay | Admitting: Cardiology

## 2020-06-26 ENCOUNTER — Other Ambulatory Visit: Payer: Self-pay

## 2020-06-26 NOTE — Telephone Encounter (Signed)
Called and spoke to patient. He has been taking metoprolol tartrate 25 mg three times daily. Dr. Lin Landsman was going to refill but wanted to confirm Dr. Bettina Gavia is ok with the patient taking it like that. Will confirm with Dr. Bettina Gavia since the patient has a couple different doses in his chart.

## 2020-06-26 NOTE — Telephone Encounter (Signed)
New Message:     Pt wants to know how many milligram of Metoprolol is he supposed to be taking?

## 2020-06-26 NOTE — Patient Outreach (Signed)
[facility]) Care Management  06/26/2020  David Fitzgerald 1961/01/30 [id number]   Telephone call to patient for follow up. No answer.  HIPAA compliant voice message left.    Plan: RN CM will attempt patient again in the month of June.    [name], RN, MSN [place] Management Care Management Coordinator Direct Line [phone] Cell [phone] Toll Free: [phone]  Fax: [phone]

## 2020-06-27 NOTE — Telephone Encounter (Signed)
Left message on patients voicemail to please return our call.   

## 2020-06-28 MED ORDER — METOPROLOL TARTRATE 25 MG PO TABS: 25.0000 mg | ORAL_TABLET | Freq: Three times a day (TID) | ORAL | 3 refills | Status: DC

## 2020-06-28 NOTE — Telephone Encounter (Signed)
Spoke to the patient just now and verified his medication list. I sent in the refill for him as Dr. Bettina Gavia instructed.    Encouraged patient to call back with any questions or concerns.

## 2020-06-28 NOTE — Addendum Note (Signed)
Addended by: Resa Miner I on: 06/28/2020 11:49 AM   Modules accepted: Orders

## 2020-06-28 NOTE — Telephone Encounter (Signed)
Left message on patients voicemail to please return our call.   

## 2020-06-29 ENCOUNTER — Encounter: Payer: Self-pay | Admitting: Physician Assistant

## 2020-06-29 ENCOUNTER — Ambulatory Visit (INDEPENDENT_AMBULATORY_CARE_PROVIDER_SITE_OTHER): Payer: Medicare HMO

## 2020-06-29 ENCOUNTER — Ambulatory Visit: Payer: Medicare HMO

## 2020-06-29 ENCOUNTER — Ambulatory Visit (INDEPENDENT_AMBULATORY_CARE_PROVIDER_SITE_OTHER): Payer: Medicare HMO | Admitting: Physician Assistant

## 2020-06-29 DIAGNOSIS — S82892B Other fracture of left lower leg, initial encounter for open fracture type I or II: Secondary | ICD-10-CM | POA: Diagnosis not present

## 2020-06-29 NOTE — Progress Notes (Signed)
Office Visit Note   Patient: David Fitzgerald           Date of Birth: 1960-05-16           MRN: [id number] Visit Date: 06/29/2020              Requested by: Angelina Sheriff, MD [place],  [place] [address] PCP: Angelina Sheriff, MD  No chief complaint on file.     HPI: Patient is 2-1/2 months status post ORIF calcaneal fusion of the left ankle.  Also application of graft.  He is doing well feels the graft is doing much better  Assessment & Plan: Visit Diagnoses:  1. Open fracture dislocation of left ankle     Plan: He may place some assistive weight on his leg in his boot for short ambulation or transfers.  Follow-up in 1 month.  Radiographs should be obtained at that time  Follow-Up Instructions: No follow-ups on file.   Ortho Exam  Patient is alert, oriented, no adenopathy, well-dressed, normal affect, normal respiratory effort.  Graft is incorporating well.  There is just a very small area of healthy beefy vascular tissue in the center.  Just a touch of bloody drainage no surrounding erythema no ascending cellulitis he has moderate soft tissue swelling in his foot but no signs of infection  Imaging: XR Ankle Complete Left  Result Date: 06/29/2020 Status post tibial calcaneal fusion.  Hardware is intact.  Still some lucency across the tibiotalar joint.  Overall well-maintained alignment very similar to a month ago    Labs: Lab Results  Component Value Date   HGBA1C 6.0 (H) 03/08/2020   HGBA1C 6.0 (H) 02/04/2020   REPTSTATUS 03/15/2020 FINAL 03/10/2020   GRAMSTAIN  03/10/2020    MODERATE WBC PRESENT, PREDOMINANTLY PMN NO ORGANISMS SEEN    CULT  03/10/2020    RARE ENTEROBACTER CLOACAE CRITICAL RESULT CALLED TO, READ BACK BY AND VERIFIED WITH: RN TGilford Rile [phone] FCP NO ANAEROBES ISOLATED Performed at [facility], [place] [address], [place],  [address]    [place] 03/10/2020     Lab Results  Component  Value Date   ALBUMIN 2.7 (L) 03/10/2020   ALBUMIN 2.9 (L) 03/08/2020   ALBUMIN 2.9 (L) 02/16/2020    Lab Results  Component Value Date   MG 2.1 02/08/2020   MG 2.0 02/04/2020   MG 1.9 02/03/2020   Lab Results  Component Value Date   VD25OH 19.57 (L) 03/08/2020   VD25OH 16.36 (L) 02/16/2020    No results found for: PREALBUMIN CBC EXTENDED Latest Ref Rng & Units 04/06/2020 03/08/2020 02/16/2020  WBC 4.0 - 10.5 K/uL - 10.6(H) 8.4  RBC 4.22 - 5.81 MIL/uL - 4.50 4.16(L)  HGB 13.0 - 17.0 g/dL 8.9(L) 12.7(L) 12.4(L)  HCT 39.0 - 52.0 % 29.4(L) 42.0 38.6(L)  PLT 150 - 400 K/uL - 460(H) 302  NEUTROABS 1.7 - 7.7 K/uL - 7.7 -  LYMPHSABS 0.7 - 4.0 K/uL - 1.5 -     There is no height or weight on file to calculate BMI.  Orders:  Orders Placed This Encounter  Procedures  . XR Ankle Complete Left   No orders of the defined types were placed in this encounter.    Procedures: No procedures performed  Clinical Data: No additional findings.  ROS:  All other systems negative, except as noted in the HPI. Review of Systems  Objective: Vital Signs: There were no  vitals taken for this visit.  Specialty Comments:  No specialty comments available.  PMFS History: Patient Active Problem List   Diagnosis Date Noted  . Left ankle pain 04/05/2020  . Open dislocation of left ankle   . A-fib ([place])   . OSA (obstructive sleep apnea) 03/09/2020  . Wound dehiscence, traumatic injury repair, left medial ankle  03/08/2020  . Vitamin D deficiency 02/16/2020  . Chronic, continuous use of opioids 02/15/2020  . Open fracture dislocation of left ankle 01/31/2020  . Ankle fracture 01/31/2020  . Permanent atrial fibrillation ([place])   . Hypertension   . Diabetes ([place])   . Bee sting allergy   . Hypotension 12/25/2017  . Chronic anticoagulation 03/07/2017  . Coronary artery calcification seen on CT scan 02/07/2017  . CAD in native artery 02/07/2017  . Persistent atrial fibrillation ([place])  02/06/2017  . Anxiety 02/06/2017  . Arthritis 02/06/2017  . Pernicious anemia 02/06/2017  . Allergy to pollen 04/06/2015  . OSA treated with BiPAP 12/14/2014  . Obesity hypoventilation syndrome ([place]) 12/14/2014  . Morbid obesity with BMI of 40.0-44.9, adult ([place]) 12/14/2014  . Hypertensive heart disease 12/14/2014  . Hyperlipidemia, mixed 12/14/2014  . Type 2 diabetes mellitus with diabetic neuropathy ([place]) 12/14/2014  . Chronic venous insufficiency 12/14/2014  . Edema 12/14/2014  . Morbid obesity ([place]) 12/14/2014  . Bell's palsy 02/24/2013  . Polyneuropathy in diabetes ([place]) 02/24/2013  . Syndrome affecting cervical region 02/24/2013  . Trigeminal neuralgia 02/24/2013  . Lumbar pain with radiation down left leg 07/09/2012  . BMI 45.0-49.9, adult ([place]) 07/09/2012  . Chronic pain syndrome 07/09/2012  . Morbid obesity with BMI of 45.0-49.9, adult ([place]) 07/09/2012  . Pain syndrome, chronic 07/09/2012   Past Medical History:  Diagnosis Date  . A-fib ([place])   . Allergy to pollen 04/06/2015  . Anxiety 02/06/2017  . Arthritis 02/06/2017  . Asthma    as a child  . Bee sting allergy    wasp / mixed vespid  . Bell's palsy 02/24/2013  . Chronic venous insufficiency 12/14/2014  . Chronic, continuous use of opioids 02/15/2020  . Depression   . Diabetes ([place])   . Dysrhythmia    afib  . Edema 12/14/2014  . GERD (gastroesophageal reflux disease)   . History of kidney stones   . Hyperlipidemia, mixed 12/14/2014  . Hypertension   . Lumbar pain with radiation down left leg 07/09/2012  . Morbid obesity ([place]) 12/14/2014  . Morbid obesity with BMI of 45.0-49.9, adult ([place]) 07/09/2012  . Obesity hypoventilation syndrome ([place]) 12/14/2014  . OSA (obstructive sleep apnea) 03/09/2020   doesn't use a Cpap  . OSA treated with BiPAP 12/14/2014  . Pain syndrome, chronic 07/09/2012  . Pernicious anemia 02/06/2017  . Pneumonia   . Polyneuropathy in diabetes ([place]) 02/24/2013  . Syndrome affecting cervical region  02/24/2013  . Trigeminal neuralgia 02/24/2013  . Type 2 diabetes mellitus with diabetic neuropathy ([place]) 12/14/2014  . Vitamin D deficiency 02/16/2020  . Wound dehiscence, traumatic injury repair, left medial ankle  03/08/2020    Family History  Problem Relation Age of Onset  . Breast cancer Mother   . Heart disease Father   . CAD Father   . Atrial fibrillation Father   . Stomach cancer Paternal Aunt   . Heart disease Paternal Uncle   . Breast cancer Maternal Grandmother     Past Surgical History:  Procedure Laterality Date  .  INTERNAL FIXATION LEFT TIBIA TO CALCANEUS AND APPLY SKIN GRAFT (Left  Ankle)  04/05/2020  . ADENOIDECTOMY    . BACK SURGERY  2002 /2003  . CARPAL TUNNEL RELEASE Bilateral   . I & D EXTREMITY Left 01/31/2020   Procedure: IRRIGATION AND DEBRIDEMENT ANKLE;  Surgeon: Altamese [place] Acres, MD;  Location: [facility];  Service: Orthopedics;  Laterality: Left;  . I & D EXTREMITY Left 03/10/2020   Procedure: DEBRIDEMENT LEFT ANKLE, APPLY SKIN GRAFT;  Surgeon: [name], MD;  Location: [facility];  Service: Orthopedics;  Laterality: Left;  . KNEE SURGERY Left 2005  . ORIF ANKLE FRACTURE Left 01/31/2020  . ORIF ANKLE FRACTURE Left 01/31/2020   Procedure: OPEN REDUCTION INTERNAL FIXATION (ORIF) ANKLE FRACTURE;  Surgeon: Altamese Imperial, MD;  Location: [facility];  Service: Orthopedics;  Laterality: Left;  . ORIF ANKLE FRACTURE Left 04/05/2020   Procedure: INTERNAL FIXATION LEFT TIBIA TO CALCANEUS AND APPLY SKIN GRAFT;  Surgeon: [name], MD;  Location: [facility];  Service: Orthopedics;  Laterality: Left;  . SHOULDER SURGERY Left 1998  . TONSILLECTOMY     Social History   Occupational History  . Not on file  Tobacco Use  . Smoking status: Former Smoker    Packs/day: 3.00    Years: 35.00    Pack years: 105.00    Types: Cigarettes    Quit date: 04/13/2006    Years since quitting: 14.2  . Smokeless tobacco: Never Used  Vaping Use  . Vaping Use: Never used  Substance and Sexual Activity   . Alcohol use: No    Alcohol/week: 0.0 standard drinks  . Drug use: No  . Sexual activity: Not on file

## 2020-06-30 DIAGNOSIS — S9305XD Dislocation of left ankle joint, subsequent encounter: Secondary | ICD-10-CM | POA: Diagnosis not present

## 2020-06-30 DIAGNOSIS — Z48817 Encounter for surgical aftercare following surgery on the skin and subcutaneous tissue: Secondary | ICD-10-CM | POA: Diagnosis not present

## 2020-07-06 ENCOUNTER — Ambulatory Visit (INDEPENDENT_AMBULATORY_CARE_PROVIDER_SITE_OTHER): Payer: Medicare HMO | Admitting: *Deleted

## 2020-07-06 DIAGNOSIS — T63441D Toxic effect of venom of bees, accidental (unintentional), subsequent encounter: Secondary | ICD-10-CM

## 2020-07-20 DIAGNOSIS — M1612 Unilateral primary osteoarthritis, left hip: Secondary | ICD-10-CM | POA: Diagnosis not present

## 2020-07-20 DIAGNOSIS — M47818 Spondylosis without myelopathy or radiculopathy, sacral and sacrococcygeal region: Secondary | ICD-10-CM | POA: Diagnosis not present

## 2020-07-20 DIAGNOSIS — G894 Chronic pain syndrome: Secondary | ICD-10-CM | POA: Diagnosis not present

## 2020-07-20 DIAGNOSIS — M5136 Other intervertebral disc degeneration, lumbar region: Secondary | ICD-10-CM | POA: Diagnosis not present

## 2020-07-20 DIAGNOSIS — M47816 Spondylosis without myelopathy or radiculopathy, lumbar region: Secondary | ICD-10-CM | POA: Diagnosis not present

## 2020-07-20 DIAGNOSIS — M25552 Pain in left hip: Secondary | ICD-10-CM | POA: Diagnosis not present

## 2020-07-20 DIAGNOSIS — Z79891 Long term (current) use of opiate analgesic: Secondary | ICD-10-CM | POA: Diagnosis not present

## 2020-07-24 ENCOUNTER — Other Ambulatory Visit: Payer: Self-pay

## 2020-07-24 NOTE — Patient Outreach (Signed)
[facility]) Care Management  07/24/2020  David Fitzgerald [birth year] [id number]   Telephone call to patient male answered stating patient is not home. Message left for return call to CM.    Plan: RN CM will wait return call.  If no return call CM will attempt patient again in 4-6 weeks.  [name], RN, MSN [facility] Care Management Coordinator Direct Line [phone] Cell [phone] Toll Free: [phone]  Fax: [phone]

## 2020-07-25 ENCOUNTER — Telehealth: Payer: Self-pay | Admitting: Cardiology

## 2020-07-25 DIAGNOSIS — E785 Hyperlipidemia, unspecified: Secondary | ICD-10-CM | POA: Diagnosis not present

## 2020-07-25 DIAGNOSIS — J45909 Unspecified asthma, uncomplicated: Secondary | ICD-10-CM | POA: Diagnosis not present

## 2020-07-25 DIAGNOSIS — R000 Tachycardia, unspecified: Secondary | ICD-10-CM | POA: Diagnosis not present

## 2020-07-25 DIAGNOSIS — K219 Gastro-esophageal reflux disease without esophagitis: Secondary | ICD-10-CM | POA: Diagnosis not present

## 2020-07-25 DIAGNOSIS — G8929 Other chronic pain: Secondary | ICD-10-CM | POA: Diagnosis not present

## 2020-07-25 DIAGNOSIS — R002 Palpitations: Secondary | ICD-10-CM | POA: Diagnosis not present

## 2020-07-25 DIAGNOSIS — R0600 Dyspnea, unspecified: Secondary | ICD-10-CM | POA: Diagnosis not present

## 2020-07-25 DIAGNOSIS — I10 Essential (primary) hypertension: Secondary | ICD-10-CM | POA: Diagnosis not present

## 2020-07-25 DIAGNOSIS — I4891 Unspecified atrial fibrillation: Secondary | ICD-10-CM | POA: Diagnosis not present

## 2020-07-25 DIAGNOSIS — Z7901 Long term (current) use of anticoagulants: Secondary | ICD-10-CM | POA: Diagnosis not present

## 2020-07-25 DIAGNOSIS — E1165 Type 2 diabetes mellitus with hyperglycemia: Secondary | ICD-10-CM | POA: Diagnosis not present

## 2020-07-25 DIAGNOSIS — I2510 Atherosclerotic heart disease of native coronary artery without angina pectoris: Secondary | ICD-10-CM | POA: Diagnosis not present

## 2020-07-25 NOTE — Telephone Encounter (Signed)
Spoke to the patient just now and he let me know that he feels like his heart is beating really fast and he feels like his blood pressure is elevated. However he does not have a way to check his vitals at home. He is sweating badly and is very nauseous as well. He states that his atrial fibrillation is out of control.   I advised that at this time he needs to be evaluated in the emergency room and he agrees. He states he is going there now.    Encouraged patient to call back with any questions or concerns.

## 2020-07-25 NOTE — Telephone Encounter (Signed)
     Patient c/o Palpitations:  High priority if patient c/o lightheadedness, shortness of breath, or chest pain  How long have you had palpitations/irregular HR/ Afib? Are you having the symptoms now? Yes   Are you currently experiencing lightheadedness, SOB or CP? A little SOB and nausea, sweating  Do you have a history of afib (atrial fibrillation) or irregular heart rhythm? Yes  Have you checked your BP or HR? (document readings if available): did not check yet  Are you experiencing any other symptoms? Pt said his heart beat is beating really fast, he gets a little SOB and and he is sweating hard. He said he doesn't know if he is in Afib  or its his panic attack, he wanted to know if he need to see a doctor or go to ED

## 2020-07-27 ENCOUNTER — Ambulatory Visit: Payer: Medicare HMO | Admitting: Orthopedic Surgery

## 2020-07-31 ENCOUNTER — Other Ambulatory Visit: Payer: Self-pay

## 2020-07-31 ENCOUNTER — Ambulatory Visit: Payer: Medicare HMO | Admitting: Cardiology

## 2020-07-31 ENCOUNTER — Encounter: Payer: Self-pay | Admitting: Cardiology

## 2020-07-31 VITALS — BP 121/89 | HR 70 | Ht 74.0 in | Wt 377.0 lb

## 2020-07-31 DIAGNOSIS — R0602 Shortness of breath: Secondary | ICD-10-CM | POA: Diagnosis not present

## 2020-07-31 DIAGNOSIS — I4819 Other persistent atrial fibrillation: Secondary | ICD-10-CM

## 2020-07-31 DIAGNOSIS — Z48817 Encounter for surgical aftercare following surgery on the skin and subcutaneous tissue: Secondary | ICD-10-CM | POA: Diagnosis not present

## 2020-07-31 DIAGNOSIS — J189 Pneumonia, unspecified organism: Secondary | ICD-10-CM | POA: Insufficient documentation

## 2020-07-31 DIAGNOSIS — K219 Gastro-esophageal reflux disease without esophagitis: Secondary | ICD-10-CM | POA: Insufficient documentation

## 2020-07-31 DIAGNOSIS — F32A Depression, unspecified: Secondary | ICD-10-CM | POA: Insufficient documentation

## 2020-07-31 DIAGNOSIS — I119 Hypertensive heart disease without heart failure: Secondary | ICD-10-CM | POA: Diagnosis not present

## 2020-07-31 DIAGNOSIS — I499 Cardiac arrhythmia, unspecified: Secondary | ICD-10-CM | POA: Insufficient documentation

## 2020-07-31 DIAGNOSIS — Z7901 Long term (current) use of anticoagulants: Secondary | ICD-10-CM | POA: Diagnosis not present

## 2020-07-31 DIAGNOSIS — Z87442 Personal history of urinary calculi: Secondary | ICD-10-CM | POA: Insufficient documentation

## 2020-07-31 DIAGNOSIS — S9305XD Dislocation of left ankle joint, subsequent encounter: Secondary | ICD-10-CM | POA: Diagnosis not present

## 2020-07-31 DIAGNOSIS — J45909 Unspecified asthma, uncomplicated: Secondary | ICD-10-CM | POA: Insufficient documentation

## 2020-07-31 MED ORDER — FUROSEMIDE 40 MG PO TABS: 40.0000 mg | ORAL_TABLET | Freq: Every day | ORAL | 3 refills | Status: DC

## 2020-07-31 MED ORDER — NITROGLYCERIN 0.4 MG SL SUBL: 0.4000 mg | SUBLINGUAL_TABLET | SUBLINGUAL | 3 refills | Status: AC | PRN

## 2020-07-31 NOTE — Progress Notes (Signed)
Cardiology Office Note:    Date:  07/31/2020   ID:  David Fitzgerald, DOB 1960-02-21, MRN [id number]  PCP:  Angelina Sheriff, MD  Cardiologist:  Shirlee More, MD    Referring MD: Angelina Sheriff, MD    ASSESSMENT:    1. Hypertensive heart disease without heart failure   2. Persistent atrial fibrillation ([place])   3. Chronic anticoagulation    PLAN:    In order of problems listed above:  Although he presented with rapid ventricular response to atrial fibrillation I think the clinical time text here was mildly decompensated heart failure we will put him back on a loop diuretic recheck echo labs 2 weeks and follow-up in the office in 6 weeks.  He also check home blood pressure daily and contact us if he has hypotension Continues increase beta-blocker and anticoagulant   Next appointment: 6 to 8 weeks    Medication Adjustments/Labs and Tests Ordered: Current medicines are reviewed at length with the patient today.  Concerns regarding medicines are outlined above.  No orders of the defined types were placed in this encounter.  No orders of the defined types were placed in this encounter.   Chief Complaint  Patient presents with   Follow-up   Atrial [facility] admission we were not involved in his care.     History of Present Illness:    David Fitzgerald is a 60 y.o. Fitzgerald with a hx of persistent atrial fibrillation chronic anticoagulation and hypertensive heart disease without heart failure.  He was last seen 03/30/2020.  He is worked into the office today in a follow-up to an emergency room visit at [facility].  Compliance with diet, lifestyle and medications: Yes  He presented to [facility] which shortness of breath and rapid heart rate he had an elevated white count was admitted by the hospitalist service and discharged 2 days later.  He has chronic atrial fibrillation his beta-blocker dose was increased there is no real  diagnosis for shortness of breath he was felt to have been improved was discharged.  Serial troponins were assessed found to be normal his proBNP level is elevated at 1550 chest x-ray showed no active disease his EKG in the emergency room showed atrial fibrillation with a rapid ventricular response at 121 bpm.  Cardiology was not consulted.  Continues to have intermittent shortness of breath and rapid heart rate is now taking a diuretic as peripheral edema and I think clinically what he has is mildly decompensated heart failure with elevated proBNP level and a plan back on a loop diuretic check echocardiogram 2 weeks check BMP and proBNP level.  He is off antibiotics he has no clinical evidence of infection on the left leg.  He had no chest pain or syncope.  Past Medical History:  Diagnosis Date   A-fib [facility])    Allergy to pollen 04/06/2015   Anxiety 02/06/2017   Arthritis 02/06/2017   Asthma    as a child   Bee sting allergy    wasp / mixed vespid   Bell's palsy 02/24/2013   Chronic venous insufficiency 12/14/2014   Chronic, continuous use of opioids 02/15/2020   Depression    Diabetes ([place])    Dysrhythmia    afib   Edema 12/14/2014   GERD (gastroesophageal reflux disease)    History of kidney stones    Hyperlipidemia, mixed 12/14/2014   Hypertension    Lumbar pain with radiation down  left leg 07/09/2012   Morbid obesity ([place]) 12/14/2014   Morbid obesity with BMI of 45.0-49.9, adult ([place]) 07/09/2012   Obesity hypoventilation syndrome ([place]) 12/14/2014   OSA (obstructive sleep apnea) 03/09/2020   doesn't use a Cpap   OSA treated with BiPAP 12/14/2014   Pain syndrome, chronic 07/09/2012   Pernicious anemia 02/06/2017   Pneumonia    Polyneuropathy in diabetes ([place]) 02/24/2013   Syndrome affecting cervical region 02/24/2013   Trigeminal neuralgia 02/24/2013   Type 2 diabetes mellitus with diabetic neuropathy ([place]) 12/14/2014   Vitamin D deficiency 02/16/2020   Wound dehiscence, traumatic injury repair, left  medial ankle  03/08/2020    Past Surgical History:  Procedure Laterality Date    INTERNAL FIXATION LEFT TIBIA TO CALCANEUS AND APPLY SKIN GRAFT (Left Ankle)  04/05/2020   ADENOIDECTOMY     BACK SURGERY  2002 /2003   CARPAL TUNNEL RELEASE Bilateral    I & D EXTREMITY Left 01/31/2020   Procedure: IRRIGATION AND DEBRIDEMENT ANKLE;  Surgeon: Altamese Lillian, MD;  Location: [facility];  Service: Orthopedics;  Laterality: Left;   I & D EXTREMITY Left 03/10/2020   Procedure: DEBRIDEMENT LEFT ANKLE, APPLY SKIN GRAFT;  Surgeon: [name], MD;  Location: [facility];  Service: Orthopedics;  Laterality: Left;   KNEE SURGERY Left 2005   ORIF ANKLE FRACTURE Left 01/31/2020   ORIF ANKLE FRACTURE Left 01/31/2020   Procedure: OPEN REDUCTION INTERNAL FIXATION (ORIF) ANKLE FRACTURE;  Surgeon: Altamese Floris, MD;  Location: [facility];  Service: Orthopedics;  Laterality: Left;   ORIF ANKLE FRACTURE Left 04/05/2020   Procedure: INTERNAL FIXATION LEFT TIBIA TO CALCANEUS AND APPLY SKIN GRAFT;  Surgeon: [name], MD;  Location: [facility];  Service: Orthopedics;  Laterality: Left;   SHOULDER SURGERY Left 1998   TONSILLECTOMY      Current Medications: Current Meds  Medication Sig   Cholecalciferol (VITAMIN D) 125 MCG (5000 UT) CAPS Take 1 capsule by mouth daily. (Patient taking differently: Take 5,000 Units by mouth daily.)   dicyclomine (BENTYL) 20 MG tablet Take 20 mg by mouth at bedtime.   DULoxetine (CYMBALTA) 60 MG capsule Take 60 mg by mouth daily.    ELIQUIS 5 MG TABS tablet TAKE 1 TABLET BY MOUTH TWICE DAILY   EPINEPHrine 0.3 mg/0.3 mL IJ SOAJ injection Use for life-threatening allergic reactions (Patient taking differently: Inject 0.3 mg into the muscle as needed (for life-threatening allergic reactions).)   gabapentin (NEURONTIN) 600 MG tablet Take 600 mg by mouth 3 (three) times daily.    metFORMIN (GLUCOPHAGE) 500 MG tablet Take 500 mg by mouth daily with breakfast.   metoprolol tartrate (LOPRESSOR) 25 MG tablet  Take 1 tablet (25 mg total) by mouth 3 (three) times daily. (Patient taking differently: Take 50 mg by mouth 3 (three) times daily.)   naloxone (NARCAN) nasal spray 4 mg/0.1 mL Place 1 spray into the nose See admin instructions. OPIOID OVERDOSE EMERGENCY   naproxen sodium (ALEVE) 220 MG tablet Take 440 mg by mouth daily as needed (for headaches).   nitroGLYCERIN (NITROSTAT) 0.4 MG SL tablet Place 0.4 mg under the tongue every 5 (five) minutes as needed for chest pain.   ondansetron (ZOFRAN-ODT) 4 MG disintegrating tablet Take 4 mg by mouth every 8 (eight) hours as needed for nausea or vomiting.   oxyCODONE-acetaminophen (PERCOCET) 5-325 MG tablet Take 1 tablet by mouth every 4 (four) hours as needed. (Patient taking differently: Take 1 tablet by mouth every 4 (four) hours as needed  for severe pain.)   pantoprazole (PROTONIX) 40 MG tablet Take 40 mg by mouth daily.   simvastatin (ZOCOR) 20 MG tablet Take 20 mg by mouth at bedtime.   vitamin B-12 1000 MCG tablet Take 1 tablet (1,000 mcg total) by mouth daily.   [DISCONTINUED] furosemide (LASIX) 40 MG tablet Take 40 mg by mouth daily as needed for fluid or edema.     Allergies:   Penicillins, Aspirin, Bee venom, and Fentanyl   Social History   Socioeconomic History   Marital status: Married    Spouse name: Not on file   Number of children: Not on file   Years of education: Not on file   Highest education level: Not on file  Occupational History   Not on file  Tobacco Use   Smoking status: Former    Packs/day: 3.00    Years: 35.00    Pack years: 105.00    Types: Cigarettes    Quit date: 04/13/2006    Years since quitting: 14.3   Smokeless tobacco: Never  Vaping Use   Vaping Use: Never used  Substance and Sexual Activity   Alcohol use: No    Alcohol/week: 0.0 standard drinks   Drug use: No   Sexual activity: Not on file  Other Topics Concern   Not on file  Social History Narrative   Not on file   Social Determinants of Health    Financial Resource Strain: Not on file  Food Insecurity: Not on file  Transportation Needs: No Transportation Needs   Lack of Transportation (Medical): No   Lack of Transportation (Non-Medical): No  Physical Activity: Not on file  Stress: Not on file  Social Connections: Not on file     Family History: The patient's family history includes Atrial fibrillation in his father; Breast cancer in his maternal grandmother and mother; CAD in his father; Heart disease in his father and paternal uncle; Stomach cancer in his paternal aunt. ROS:   Please see the history of present illness.    All other systems reviewed and are negative.  EKGs/Labs/Other Studies Reviewed:    The following studies were reviewed today:   Recent Labs: 02/08/2020: Magnesium 2.1 03/08/2020: Platelets 460 03/10/2020: ALT 19 04/05/2020: BUN 18; Creatinine, Ser 1.10; Potassium 4.9; Sodium 135 04/06/2020: Hemoglobin 8.9  Recent Lipid Panel    Component Value Date/Time   CHOL 156 12/03/2018 1502   TRIG 301 (H) 12/03/2018 1502   HDL 29 (L) 12/03/2018 1502   CHOLHDL 5.4 (H) 12/03/2018 1502   LDLCALC 78 12/03/2018 1502    Physical Exam:    VS:  BP 121/89 (BP Location: Left Arm, Patient Position: Sitting, Cuff Size: Large)   Pulse 70   Ht 6\' 2"  (1.88 m)   Wt (!) 377 lb (171 kg)   SpO2 96%   BMI 48.40 kg/m     Wt Readings from Last 3 Encounters:  07/31/20 (!) 377 lb (171 kg)  04/05/20 (!) 365 lb (165.6 kg)  03/30/20 (!) 365 lb (165.6 kg)     GEN: BMI greater than 75 age obese well nourished, well developed in no acute distress HEENT: Normal NECK: No JVD; No carotid bruits LYMPHATICS: No lymphadenopathy CARDIAC: Irregular rhythm variable first heart sound RRR, no murmurs, rubs, gallops RESPIRATORY:  Clear to auscultation without rales, wheezing or rhonchi  ABDOMEN: Soft, non-tender, non-distended MUSCULOSKELETAL: 2+ right lower extremity ankle to knee pitting edema; No deformity he has a boot on the left  lower extremity SKIN: Warm and  dry NEUROLOGIC:  Alert and oriented x 3 PSYCHIATRIC:  Normal affect    Signed, Shirlee More, MD  07/31/2020 2:24 PM    [place]

## 2020-07-31 NOTE — Patient Instructions (Signed)
Medication Instructions:  Your physician has recommended you make the following change in your medication:  CHANGE: Furosemide 40 mg take one tablet by mouth daily.  *If you need a refill on your cardiac medications before your next appointment, please call your pharmacy*   Lab Work: Your physician recommends that you return for lab work in: [place] If you have labs (blood work) drawn today and your tests are completely normal, you will receive your results only by: [place] (if you have [place]) OR A paper copy in the mail If you have any lab test that is abnormal or we need to change your treatment, we will call you to review the results.   Testing/Procedures: Your physician has requested that you have an echocardiogram. Echocardiography is a painless test that uses sound waves to create images of your heart. It provides your doctor with information about the size and shape of your heart and how well your heart's chambers and valves are working. This procedure takes approximately one hour. There are no restrictions for this procedure.    Follow-Up: At [facility], you and your health needs are our priority.  As part of our continuing mission to provide you with exceptional heart care, we have created designated Provider Care Teams.  These Care Teams include your primary Cardiologist (physician) and Advanced Practice Providers (APPs -  Physician Assistants and Nurse Practitioners) who all work together to provide you with the care you need, when you need it.  We recommend signing up for the patient portal called "MyChart".  Sign up information is provided on this After Visit Summary.  MyChart is used to connect with patients for Virtual Visits (Telemedicine).  Patients are able to view lab/test results, encounter notes, upcoming appointments, etc.  Non-urgent messages can be sent to your provider as well.   To learn more about what you can do with MyChart, go to  [email or web address].    Your next appointment:   6 week(s)  The format for your next appointment:   In Person  Provider:   Shirlee More, MD   Other Instructions

## 2020-08-01 ENCOUNTER — Telehealth: Payer: Self-pay

## 2020-08-01 DIAGNOSIS — I10 Essential (primary) hypertension: Secondary | ICD-10-CM | POA: Diagnosis not present

## 2020-08-01 DIAGNOSIS — Z6841 Body Mass Index (BMI) 40.0 and over, adult: Secondary | ICD-10-CM | POA: Diagnosis not present

## 2020-08-01 DIAGNOSIS — I480 Paroxysmal atrial fibrillation: Secondary | ICD-10-CM | POA: Diagnosis not present

## 2020-08-01 LAB — BASIC METABOLIC PANEL
BUN/Creatinine Ratio: 10 (ref 10–24)
BUN: 10 mg/dL (ref 8–27)
CO2: 27 mmol/L (ref 20–29)
Calcium: 9.5 mg/dL (ref 8.6–10.2)
Chloride: 96 mmol/L (ref 96–106)
Creatinine, Ser: 0.99 mg/dL (ref 0.76–1.27)
Glucose: 128 mg/dL — ABNORMAL HIGH (ref 65–99)
Potassium: 4 mmol/L (ref 3.5–5.2)
Sodium: 138 mmol/L (ref 134–144)
eGFR: 87 mL/min/{1.73_m2} (ref >59)

## 2020-08-01 LAB — PRO B NATRIURETIC PEPTIDE: NT-Pro BNP: 1416 pg/mL — ABNORMAL HIGH (ref 0–210)

## 2020-08-01 NOTE — Telephone Encounter (Signed)
Patient returning call.

## 2020-08-01 NOTE — Telephone Encounter (Signed)
-----   Message from Richardo Priest, MD sent at 08/01/2020  7:51 AM EDT ----- Normal or stable result  No further changes I placed him on a diuretic yesterday in the office for heart failure

## 2020-08-01 NOTE — Telephone Encounter (Signed)
Left message on patients voicemail to please return our call.   

## 2020-08-01 NOTE — Telephone Encounter (Signed)
Spoke with patient regarding results and recommendation.  Patient verbalizes understanding and is agreeable to plan of care. Advised patient to call back with any issues or concerns.  

## 2020-08-03 ENCOUNTER — Ambulatory Visit (INDEPENDENT_AMBULATORY_CARE_PROVIDER_SITE_OTHER): Payer: Medicare HMO | Admitting: Physician Assistant

## 2020-08-03 ENCOUNTER — Ambulatory Visit (INDEPENDENT_AMBULATORY_CARE_PROVIDER_SITE_OTHER): Payer: Medicare HMO

## 2020-08-03 ENCOUNTER — Encounter: Payer: Self-pay | Admitting: Orthopedic Surgery

## 2020-08-03 DIAGNOSIS — S82892B Other fracture of left lower leg, initial encounter for open fracture type I or II: Secondary | ICD-10-CM | POA: Diagnosis not present

## 2020-08-03 NOTE — Progress Notes (Signed)
Office Visit Note   Patient: David Fitzgerald           Date of Birth: 23-Aug-1960           MRN: [id number] Visit Date: 08/03/2020              Requested by: Angelina Sheriff, MD [address] [place],  [place] [address] PCP: Angelina Sheriff, MD  Chief Complaint  Patient presents with   Left Ankle - Follow-up      HPI: Patient is a pleasant 60 year old gentleman who is 57-month status post tibial calcaneal fusion and Kerecis skin grafting.  He has been nonweightbearing since that time.  He states that his ankle really is not hurting him when he takes a few steps on it.  He does have generalized leg pain from weakness.  Assessment & Plan: Visit Diagnoses:  1. Open fracture dislocation of left ankle     Plan: Patient may begin weightbearing as tolerated in his boot he should use an assistive device such as a walker.  Have discussed with him quad strengthening exercises he can do on his own follow-up in 4 weeks  Follow-Up Instructions: No follow-ups on file.   Ortho Exam  Patient is alert, oriented, no adenopathy, well-dressed, normal affect, normal respiratory effort. Examination demonstrates graft is completely incorporated no drainage moderate soft tissue swelling in his foot but no cellulitis compartments are soft and compressible no tenderness around the ankle notes signs of infection  Imaging: XR Ankle Complete Left  Result Date: 08/03/2020 Stable tibial calcaneal fusion.  Hardware in place.  Well-maintained alignment    Labs: Lab Results  Component Value Date   HGBA1C 6.0 (H) 03/08/2020   HGBA1C 6.0 (H) 02/04/2020   REPTSTATUS 03/15/2020 FINAL 03/10/2020   GRAMSTAIN  03/10/2020    MODERATE WBC PRESENT, PREDOMINANTLY PMN NO ORGANISMS SEEN    CULT  03/10/2020    RARE ENTEROBACTER CLOACAE CRITICAL RESULT CALLED TO, READ BACK BY AND VERIFIED WITH: RN TGilford Rile [phone] FCP NO ANAEROBES ISOLATED Performed at [facility], [place] [address],  [place],  [address]    [place] 03/10/2020     Lab Results  Component Value Date   ALBUMIN 2.7 (L) 03/10/2020   ALBUMIN 2.9 (L) 03/08/2020   ALBUMIN 2.9 (L) 02/16/2020    Lab Results  Component Value Date   MG 2.1 02/08/2020   MG 2.0 02/04/2020   MG 1.9 02/03/2020   Lab Results  Component Value Date   VD25OH 19.57 (L) 03/08/2020   VD25OH 16.36 (L) 02/16/2020    No results found for: PREALBUMIN CBC EXTENDED Latest Ref Rng & Units 04/06/2020 03/08/2020 02/16/2020  WBC 4.0 - 10.5 K/uL - 10.6(H) 8.4  RBC 4.22 - 5.81 MIL/uL - 4.50 4.16(L)  HGB 13.0 - 17.0 g/dL 8.9(L) 12.7(L) 12.4(L)  HCT 39.0 - 52.0 % 29.4(L) 42.0 38.6(L)  PLT 150 - 400 K/uL - 460(H) 302  NEUTROABS 1.7 - 7.7 K/uL - 7.7 -  LYMPHSABS 0.7 - 4.0 K/uL - 1.5 -     There is no height or weight on file to calculate BMI.  Orders:  Orders Placed This Encounter  Procedures   XR Ankle Complete Left   No orders of the defined types were placed in this encounter.    Procedures: No procedures performed  Clinical Data: No additional findings.  ROS:  All other systems negative, except as noted in the HPI. Review of Systems  Objective: Vital Signs: There were no vitals taken for this visit.  Specialty Comments:  No specialty comments available.  PMFS History: Patient Active Problem List   Diagnosis Date Noted   Asthma 07/31/2020   Depression 07/31/2020   Dysrhythmia 07/31/2020   GERD (gastroesophageal reflux disease) 07/31/2020   History of kidney stones 07/31/2020   Pneumonia 07/31/2020   Left ankle pain 04/05/2020   Open dislocation of left ankle    A-fib (HCC)    OSA (obstructive sleep apnea) 03/09/2020   Wound dehiscence, traumatic injury repair, left medial ankle  03/08/2020   Vitamin D deficiency 02/16/2020   Chronic, continuous use of opioids 02/15/2020   Open fracture dislocation of left ankle 01/31/2020   Ankle fracture 01/31/2020   Permanent atrial fibrillation  ([place])    Hypertension    Diabetes ([place])    Bee sting allergy    Hypotension 12/25/2017   Chronic anticoagulation 03/07/2017   Coronary artery calcification seen on CT scan 02/07/2017   CAD in native artery 02/07/2017   Persistent atrial fibrillation ([place]) 02/06/2017   Anxiety 02/06/2017   Arthritis 02/06/2017   Pernicious anemia 02/06/2017   Allergy to pollen 04/06/2015   OSA treated with BiPAP 12/14/2014   Obesity hypoventilation syndrome ([place]) 12/14/2014   Morbid obesity with BMI of 40.0-44.9, adult ([place]) 12/14/2014   Hypertensive heart disease 12/14/2014   Hyperlipidemia, mixed 12/14/2014   Type 2 diabetes mellitus with diabetic neuropathy ([place]) 12/14/2014   Chronic venous insufficiency 12/14/2014   Edema 12/14/2014   Morbid obesity ([place]) 12/14/2014   Bell's palsy 02/24/2013   Polyneuropathy in diabetes ([place]) 02/24/2013   Syndrome affecting cervical region 02/24/2013   Trigeminal neuralgia 02/24/2013   Lumbar pain with radiation down left leg 07/09/2012   BMI 45.0-49.9, adult ([place]) 07/09/2012   Chronic pain syndrome 07/09/2012   Morbid obesity with BMI of 45.0-49.9, adult ([place]) 07/09/2012   Pain syndrome, chronic 07/09/2012   Past Medical History:  Diagnosis Date   A-fib ([place])    Allergy to pollen 04/06/2015   Anxiety 02/06/2017   Arthritis 02/06/2017   Asthma    as a child   Bee sting allergy    wasp / mixed vespid   Bell's palsy 02/24/2013   Chronic venous insufficiency 12/14/2014   Chronic, continuous use of opioids 02/15/2020   Depression    Diabetes ([place])    Dysrhythmia    afib   Edema 12/14/2014   GERD (gastroesophageal reflux disease)    History of kidney stones    Hyperlipidemia, mixed 12/14/2014   Hypertension    Lumbar pain with radiation down left leg 07/09/2012   Morbid obesity ([place]) 12/14/2014   Morbid obesity with BMI of 45.0-49.9, adult ([place]) 07/09/2012   Obesity hypoventilation syndrome ([place]) 12/14/2014   OSA (obstructive sleep apnea) 03/09/2020   doesn't  use a Cpap   OSA treated with BiPAP 12/14/2014   Pain syndrome, chronic 07/09/2012   Pernicious anemia 02/06/2017   Pneumonia    Polyneuropathy in diabetes ([place]) 02/24/2013   Syndrome affecting cervical region 02/24/2013   Trigeminal neuralgia 02/24/2013   Type 2 diabetes mellitus with diabetic neuropathy ([place]) 12/14/2014   Vitamin D deficiency 02/16/2020   Wound dehiscence, traumatic injury repair, left medial ankle  03/08/2020    Family History  Problem Relation Age of Onset   Breast cancer Mother    Heart disease Father    CAD Father    Atrial fibrillation Father    Stomach cancer Paternal Aunt  Heart disease Paternal Uncle    Breast cancer Maternal Grandmother     Past Surgical History:  Procedure Laterality Date    INTERNAL FIXATION LEFT TIBIA TO CALCANEUS AND APPLY SKIN GRAFT (Left Ankle)  04/05/2020   ADENOIDECTOMY     BACK SURGERY  2002 /2003   CARPAL TUNNEL RELEASE Bilateral    I & D EXTREMITY Left 01/31/2020   Procedure: IRRIGATION AND DEBRIDEMENT ANKLE;  Surgeon: Altamese Fort Bliss, MD;  Location: [facility];  Service: Orthopedics;  Laterality: Left;   I & D EXTREMITY Left 03/10/2020   Procedure: DEBRIDEMENT LEFT ANKLE, APPLY SKIN GRAFT;  Surgeon: [name], MD;  Location: [facility];  Service: Orthopedics;  Laterality: Left;   KNEE SURGERY Left 2005   ORIF ANKLE FRACTURE Left 01/31/2020   ORIF ANKLE FRACTURE Left 01/31/2020   Procedure: OPEN REDUCTION INTERNAL FIXATION (ORIF) ANKLE FRACTURE;  Surgeon: Altamese Gilberton, MD;  Location: [facility];  Service: Orthopedics;  Laterality: Left;   ORIF ANKLE FRACTURE Left 04/05/2020   Procedure: INTERNAL FIXATION LEFT TIBIA TO CALCANEUS AND APPLY SKIN GRAFT;  Surgeon: [name], MD;  Location: [facility];  Service: Orthopedics;  Laterality: Left;   SHOULDER SURGERY Left 1998   TONSILLECTOMY     Social History   Occupational History   Not on file  Tobacco Use   Smoking status: Former    Packs/day: 3.00    Years: 35.00    Pack years: 105.00     Types: Cigarettes    Quit date: 04/13/2006    Years since quitting: 14.3   Smokeless tobacco: Never  Vaping Use   Vaping Use: Never used  Substance and Sexual Activity   Alcohol use: No    Alcohol/week: 0.0 standard drinks   Drug use: No   Sexual activity: Not on file

## 2020-08-10 DIAGNOSIS — Z6841 Body Mass Index (BMI) 40.0 and over, adult: Secondary | ICD-10-CM | POA: Diagnosis not present

## 2020-08-10 DIAGNOSIS — M1990 Unspecified osteoarthritis, unspecified site: Secondary | ICD-10-CM | POA: Diagnosis not present

## 2020-08-10 DIAGNOSIS — F410 Panic disorder [episodic paroxysmal anxiety] without agoraphobia: Secondary | ICD-10-CM | POA: Diagnosis not present

## 2020-08-18 ENCOUNTER — Ambulatory Visit (INDEPENDENT_AMBULATORY_CARE_PROVIDER_SITE_OTHER): Payer: Medicare HMO

## 2020-08-18 ENCOUNTER — Other Ambulatory Visit: Payer: Self-pay

## 2020-08-18 DIAGNOSIS — R0602 Shortness of breath: Secondary | ICD-10-CM | POA: Diagnosis not present

## 2020-08-18 DIAGNOSIS — I119 Hypertensive heart disease without heart failure: Secondary | ICD-10-CM | POA: Diagnosis not present

## 2020-08-18 DIAGNOSIS — Z7901 Long term (current) use of anticoagulants: Secondary | ICD-10-CM

## 2020-08-18 DIAGNOSIS — I4819 Other persistent atrial fibrillation: Secondary | ICD-10-CM

## 2020-08-18 LAB — ECHOCARDIOGRAM COMPLETE
Area-P 1/2: 5.58 cm2
S' Lateral: 3.4 cm

## 2020-08-18 NOTE — Progress Notes (Signed)
Complete echocardiogram performed. Contrast needed but not used. No IV access could be obtained.  Jimmy Latonia Conrow RDCS, RVT

## 2020-08-21 ENCOUNTER — Other Ambulatory Visit: Payer: Self-pay

## 2020-08-21 ENCOUNTER — Telehealth: Payer: Self-pay

## 2020-08-21 DIAGNOSIS — I119 Hypertensive heart disease without heart failure: Secondary | ICD-10-CM

## 2020-08-21 MED ORDER — SACUBITRIL-VALSARTAN 24-26 MG PO TABS: 1.0000 | ORAL_TABLET | Freq: Two times a day (BID) | ORAL | 3 refills | Status: DC

## 2020-08-21 NOTE — Telephone Encounter (Signed)
Spoke with patient regarding results and recommendation.  Patient verbalizes understanding and is agreeable to plan of care. Advised patient to call back with any issues or concerns.  

## 2020-08-21 NOTE — Telephone Encounter (Signed)
-----   Message from Richardo Priest, MD sent at 08/20/2020  7:22 PM EDT ----- Heart muscle function is mildly reduced he needs to take a fluid pill  I think he would benefit from starting Entresto 24/26 twice daily and needs a follow-up BMP 2 weeks after initiating.

## 2020-08-21 NOTE — Patient Outreach (Signed)
[facility]) Care Management  08/21/2020  GREYSIN MEDLEN [birth year] [id number]   Telephone call to patient for follow up. No answer.  HIPAA compliant voice message left.  Plan: RN CM will attempt again in the 4-6 weeks and send letter.   [name], RN, MSN [place] Management Care Management Coordinator Direct Line [phone] Cell [phone] Toll Free: [phone]  Fax: [phone]

## 2020-08-30 DIAGNOSIS — S9305XD Dislocation of left ankle joint, subsequent encounter: Secondary | ICD-10-CM | POA: Diagnosis not present

## 2020-08-30 DIAGNOSIS — Z48817 Encounter for surgical aftercare following surgery on the skin and subcutaneous tissue: Secondary | ICD-10-CM | POA: Diagnosis not present

## 2020-08-31 ENCOUNTER — Ambulatory Visit (INDEPENDENT_AMBULATORY_CARE_PROVIDER_SITE_OTHER): Payer: Medicare HMO

## 2020-08-31 ENCOUNTER — Ambulatory Visit: Payer: Medicare HMO | Admitting: Orthopedic Surgery

## 2020-08-31 ENCOUNTER — Encounter: Payer: Self-pay | Admitting: Orthopedic Surgery

## 2020-08-31 ENCOUNTER — Ambulatory Visit (INDEPENDENT_AMBULATORY_CARE_PROVIDER_SITE_OTHER): Payer: Medicare HMO | Admitting: *Deleted

## 2020-08-31 ENCOUNTER — Other Ambulatory Visit: Payer: Self-pay

## 2020-08-31 DIAGNOSIS — M25562 Pain in left knee: Secondary | ICD-10-CM

## 2020-08-31 DIAGNOSIS — T63441D Toxic effect of venom of bees, accidental (unintentional), subsequent encounter: Secondary | ICD-10-CM

## 2020-08-31 DIAGNOSIS — S82892B Other fracture of left lower leg, initial encounter for open fracture type I or II: Secondary | ICD-10-CM | POA: Diagnosis not present

## 2020-08-31 DIAGNOSIS — M14672 Charcot's joint, left ankle and foot: Secondary | ICD-10-CM | POA: Diagnosis not present

## 2020-08-31 DIAGNOSIS — G8929 Other chronic pain: Secondary | ICD-10-CM

## 2020-09-07 DIAGNOSIS — G894 Chronic pain syndrome: Secondary | ICD-10-CM | POA: Diagnosis not present

## 2020-09-07 DIAGNOSIS — M1612 Unilateral primary osteoarthritis, left hip: Secondary | ICD-10-CM | POA: Diagnosis not present

## 2020-09-07 DIAGNOSIS — M47818 Spondylosis without myelopathy or radiculopathy, sacral and sacrococcygeal region: Secondary | ICD-10-CM | POA: Diagnosis not present

## 2020-09-07 DIAGNOSIS — M5136 Other intervertebral disc degeneration, lumbar region: Secondary | ICD-10-CM | POA: Diagnosis not present

## 2020-09-07 DIAGNOSIS — M47816 Spondylosis without myelopathy or radiculopathy, lumbar region: Secondary | ICD-10-CM | POA: Diagnosis not present

## 2020-09-07 DIAGNOSIS — M25552 Pain in left hip: Secondary | ICD-10-CM | POA: Diagnosis not present

## 2020-09-07 DIAGNOSIS — Z79891 Long term (current) use of opiate analgesic: Secondary | ICD-10-CM | POA: Diagnosis not present

## 2020-09-07 DIAGNOSIS — Z1389 Encounter for screening for other disorder: Secondary | ICD-10-CM | POA: Diagnosis not present

## 2020-09-10 NOTE — Progress Notes (Signed)
Cardiology Office Note:    Date:  09/11/2020   ID:  David Fitzgerald, DOB 13-Nov-1960, MRN [id number]  PCP:  Angelina Sheriff, MD  Cardiologist:  Shirlee More, MD    Referring MD: Angelina Sheriff, MD    ASSESSMENT:    1. Hypertensive heart disease with chronic combined systolic and diastolic congestive heart failure ([place])   2. Permanent atrial fibrillation ([place])   3. Chronic anticoagulation    PLAN:    In order of problems listed above:  Is improved with the addition of a loop diuretic with reduced ejection fraction now treated with Delene Loll continue with same and will add SGLT2 inhibitor recheck BMP proBNP Stable rate controlled continue his anticoagulant beta-blocker   Next appointment: 3 months   Medication Adjustments/Labs and Tests Ordered: Current medicines are reviewed at length with the patient today.  Concerns regarding medicines are outlined above.  No orders of the defined types were placed in this encounter.  No orders of the defined types were placed in this encounter.   Chief Complaint  Patient presents with   Follow-up    History of Present Illness:    David Fitzgerald is a 60 y.o. male with a hx of permanent atrial fibrillation chronic anticoagulation and hypertensive heart disease last seen 07/11/2020 felt to have mild heart failure with rapid ventricular rate and preop proBNP level elevated at 1550.  Compliance with diet, lifestyle and medications: Yes  Is improved edema resolved no longer short of breath. He tolerates Delene Loll will continue on can uptitrate with a soft blood pressure today. He is diabetic and will transition from metformin to metformin and SGLT2 inhibitor. Tolerates his anticoagulant without bleeding request financial assistance.  He has had no chest pain palpitation or syncope  Repeat proBNP level remained elevated  Ref Range & Units 1 mo ago  NT-Pro BNP 0 - 210 pg/mL 1,416High    Echocardiogram 08/18/2020 showed mild  left ventricular dysfunction EF 40 to 45% moderate concentric LVH the right ventricle was normal pulmonary pressures were normal.  There is no significant valvular abnormality and the atria were not well defined.  1. Atrial fibrillation      Left ventricular ejection fraction, by estimation, is 40 to 45%. The  left ventricle has mild to moderately decreased function. Left ventricular  endocardial border not optimally defined to evaluate regional wall motion.  There is moderate concentric  left ventricular hypertrophy. Left ventricular diastolic parameters are  indeterminate.   2. Right ventricular systolic function is normal. The right ventricular  size is normal. There is normal pulmonary artery systolic pressure.   3. The mitral valve is normal in structure. No evidence of mitral valve  regurgitation. No evidence of mitral stenosis.   4. The aortic valve was not well visualized. Aortic valve regurgitation  is not visualized. No aortic stenosis is present.  Past Medical History:  Diagnosis Date   A-fib [name])    Allergy to pollen 04/06/2015   Anxiety 02/06/2017   Arthritis 02/06/2017   Asthma    as a child   Bee sting allergy    wasp / mixed vespid   Bell's palsy 02/24/2013   Chronic venous insufficiency 12/14/2014   Chronic, continuous use of opioids 02/15/2020   Depression    Diabetes ([place])    Dysrhythmia    afib   Edema 12/14/2014   GERD (gastroesophageal reflux disease)    History of kidney stones    Hyperlipidemia, mixed 12/14/2014  Hypertension    Lumbar pain with radiation down left leg 07/09/2012   Morbid obesity ([place]) 12/14/2014   Morbid obesity with BMI of 45.0-49.9, adult ([place]) 07/09/2012   Obesity hypoventilation syndrome ([place]) 12/14/2014   OSA (obstructive sleep apnea) 03/09/2020   doesn't use a Cpap   OSA treated with BiPAP 12/14/2014   Pain syndrome, chronic 07/09/2012   Pernicious anemia 02/06/2017   Pneumonia    Polyneuropathy in diabetes ([place]) 02/24/2013   Syndrome affecting  cervical region 02/24/2013   Trigeminal neuralgia 02/24/2013   Type 2 diabetes mellitus with diabetic neuropathy ([place]) 12/14/2014   Vitamin D deficiency 02/16/2020   Wound dehiscence, traumatic injury repair, left medial ankle  03/08/2020    Past Surgical History:  Procedure Laterality Date    INTERNAL FIXATION LEFT TIBIA TO CALCANEUS AND APPLY SKIN GRAFT (Left Ankle)  04/05/2020   ADENOIDECTOMY     BACK SURGERY  2002 /2003   CARPAL TUNNEL RELEASE Bilateral    I & D EXTREMITY Left 01/31/2020   Procedure: IRRIGATION AND DEBRIDEMENT ANKLE;  Surgeon: Altamese Coburg, MD;  Location: [facility];  Service: Orthopedics;  Laterality: Left;   I & D EXTREMITY Left 03/10/2020   Procedure: DEBRIDEMENT LEFT ANKLE, APPLY SKIN GRAFT;  Surgeon: [name], MD;  Location: [facility];  Service: Orthopedics;  Laterality: Left;   KNEE SURGERY Left 2005   ORIF ANKLE FRACTURE Left 01/31/2020   ORIF ANKLE FRACTURE Left 01/31/2020   Procedure: OPEN REDUCTION INTERNAL FIXATION (ORIF) ANKLE FRACTURE;  Surgeon: Altamese Agenda, MD;  Location: [facility];  Service: Orthopedics;  Laterality: Left;   ORIF ANKLE FRACTURE Left 04/05/2020   Procedure: INTERNAL FIXATION LEFT TIBIA TO CALCANEUS AND APPLY SKIN GRAFT;  Surgeon: [name], MD;  Location: [facility];  Service: Orthopedics;  Laterality: Left;   SHOULDER SURGERY Left 1998   TONSILLECTOMY      Current Medications: Current Meds  Medication Sig   ascorbic acid (VITAMIN C) 500 MG tablet Take 1,000 mg by mouth daily.   dicyclomine (BENTYL) 20 MG tablet Take 20 mg by mouth at bedtime.   DULoxetine (CYMBALTA) 60 MG capsule Take 60 mg by mouth daily.    ELIQUIS 5 MG TABS tablet TAKE 1 TABLET BY MOUTH TWICE DAILY   EPINEPHrine 0.3 mg/0.3 mL IJ SOAJ injection Inject 0.3 mg into the muscle as needed for anaphylaxis.   furosemide (LASIX) 40 MG tablet Take 1 tablet (40 mg total) by mouth daily.   gabapentin (NEURONTIN) 600 MG tablet Take 600 mg by mouth 3 (three) times daily.    metFORMIN  (GLUCOPHAGE) 500 MG tablet Take 500 mg by mouth daily with breakfast.   metoprolol tartrate (LOPRESSOR) 50 MG tablet Take 50 mg by mouth 3 (three) times daily.   naloxone (NARCAN) nasal spray 4 mg/0.1 mL Place 1 spray into the nose See admin instructions. OPIOID OVERDOSE EMERGENCY   naproxen sodium (ALEVE) 220 MG tablet Take 440 mg by mouth daily as needed (for headaches).   nitroGLYCERIN (NITROSTAT) 0.4 MG SL tablet Place 1 tablet (0.4 mg total) under the tongue every 5 (five) minutes as needed for chest pain.   ondansetron (ZOFRAN-ODT) 4 MG disintegrating tablet Take 4 mg by mouth every 8 (eight) hours as needed for nausea or vomiting.   oxyCODONE-acetaminophen (PERCOCET) 7.5-325 MG tablet Take 1 tablet by mouth every 6 (six) hours as needed for pain.   pantoprazole (PROTONIX) 40 MG tablet Take 40 mg by mouth daily.   sacubitril-valsartan (ENTRESTO) 24-26 MG  Take 1 tablet by mouth 2 (two) times daily.   simvastatin (ZOCOR) 20 MG tablet Take 20 mg by mouth at bedtime.   vitamin B-12 1000 MCG tablet Take 1 tablet (1,000 mcg total) by mouth daily.   Vitamin D, Ergocalciferol, (DRISDOL) 1.25 MG (50000 UNIT) CAPS capsule Take 50,000 Units by mouth daily.   [DISCONTINUED] metoprolol tartrate (LOPRESSOR) 25 MG tablet Take 1 tablet (25 mg total) by mouth 3 (three) times daily. (Patient taking differently: Take 50 mg by mouth 3 (three) times daily.)     Allergies:   Penicillins, Aspirin, Bee venom, and Fentanyl   Social History   Socioeconomic History   Marital status: Married    Spouse name: Not on file   Number of children: Not on file   Years of education: Not on file   Highest education level: Not on file  Occupational History   Not on file  Tobacco Use   Smoking status: Former    Packs/day: 3.00    Years: 35.00    Pack years: 105.00    Types: Cigarettes    Quit date: 04/13/2006    Years since quitting: 14.4   Smokeless tobacco: Never  Vaping Use   Vaping Use: Never used  Substance  and Sexual Activity   Alcohol use: No    Alcohol/week: 0.0 standard drinks   Drug use: No   Sexual activity: Not on file  Other Topics Concern   Not on file  Social History Narrative   Not on file   Social Determinants of Health   Financial Resource Strain: Not on file  Food Insecurity: Not on file  Transportation Needs: No Transportation Needs   Lack of Transportation (Medical): No   Lack of Transportation (Non-Medical): No  Physical Activity: Not on file  Stress: Not on file  Social Connections: Not on file     Family History: The patient's family history includes Atrial fibrillation in his father; Breast cancer in his maternal grandmother and mother; CAD in his father; Heart disease in his father and paternal uncle; Stomach cancer in his paternal aunt. ROS:   Please see the history of present illness.    All other systems reviewed and are negative.  EKGs/Labs/Other Studies Reviewed:    The following studies were reviewed today:    Recent Labs: 02/08/2020: Magnesium 2.1 03/08/2020: Platelets 460 03/10/2020: ALT 19 04/06/2020: Hemoglobin 8.9 07/31/2020: BUN 10; Creatinine, Ser 0.99; NT-Pro BNP 1,416; Potassium 4.0; Sodium 138  Recent Lipid Panel    Component Value Date/Time   CHOL 156 12/03/2018 1502   TRIG 301 (H) 12/03/2018 1502   HDL 29 (L) 12/03/2018 1502   CHOLHDL 5.4 (H) 12/03/2018 1502   LDLCALC 78 12/03/2018 1502    Physical Exam:    VS:  BP 110/70 (BP Location: Left Arm, Patient Position: Sitting, Cuff Size: Large)   Pulse 60   Ht '6\' 2"'$  (1.88 m)   Wt (!) 378 lb (171.5 kg)   SpO2 91%   BMI 48.53 kg/m     Wt Readings from Last 3 Encounters:  09/11/20 (!) 378 lb (171.5 kg)  07/31/20 (!) 377 lb (171 kg)  04/05/20 (!) 365 lb (165.6 kg)     GEN: Obese well nourished, well developed in no acute distress HEENT: Normal NECK: No JVD; No carotid bruits LYMPHATICS: No lymphadenopathy CARDIAC: Irregular rate and rhythm no murmurs, rubs, gallops RESPIRATORY:   Clear to auscultation without rales, wheezing or rhonchi  ABDOMEN: Soft, non-tender, non-distended MUSCULOSKELETAL:  No edema;  No deformity  SKIN: Warm and dry NEUROLOGIC:  Alert and oriented x 3 PSYCHIATRIC:  Normal affect    Signed, Shirlee More, MD  09/11/2020 10:44 AM    [place]

## 2020-09-11 ENCOUNTER — Other Ambulatory Visit: Payer: Self-pay

## 2020-09-11 ENCOUNTER — Encounter: Payer: Self-pay | Admitting: Cardiology

## 2020-09-11 ENCOUNTER — Ambulatory Visit: Payer: Medicare HMO | Admitting: Cardiology

## 2020-09-11 VITALS — BP 110/70 | HR 60 | Ht 74.0 in | Wt 378.0 lb

## 2020-09-11 DIAGNOSIS — Z7901 Long term (current) use of anticoagulants: Secondary | ICD-10-CM | POA: Diagnosis not present

## 2020-09-11 DIAGNOSIS — R0602 Shortness of breath: Secondary | ICD-10-CM | POA: Diagnosis not present

## 2020-09-11 DIAGNOSIS — I110 Hypertensive heart disease with heart failure: Secondary | ICD-10-CM | POA: Diagnosis not present

## 2020-09-11 DIAGNOSIS — I4821 Permanent atrial fibrillation: Secondary | ICD-10-CM

## 2020-09-11 DIAGNOSIS — I5042 Chronic combined systolic (congestive) and diastolic (congestive) heart failure: Secondary | ICD-10-CM

## 2020-09-11 MED ORDER — DAPAGLIFLOZIN PROPANEDIOL 10 MG PO TABS: 10.0000 mg | ORAL_TABLET | Freq: Every day | ORAL | 3 refills | Status: DC

## 2020-09-11 NOTE — Patient Instructions (Signed)
Medication Instructions:  Your physician has recommended you make the following change in your medication:  STOP: Metformin DECREASE: Gabapentin to twice daily.  START: Farxiga 10 mg take one tablet by mouth daily.  *If you need a refill on your cardiac medications before your next appointment, please call your pharmacy*   Lab Work: Your physician recommends that you return for lab work in: [place] If you have labs (blood work) drawn today and your tests are completely normal, you will receive your results only by: [place] (if you have [place]) OR A paper copy in the mail If you have any lab test that is abnormal or we need to change your treatment, we will call you to review the results.   Testing/Procedures: None   Follow-Up: At [facility], you and your health needs are our priority.  As part of our continuing mission to provide you with exceptional heart care, we have created designated Provider Care Teams.  These Care Teams include your primary Cardiologist (physician) and Advanced Practice Providers (APPs -  Physician Assistants and Nurse Practitioners) who all work together to provide you with the care you need, when you need it.  We recommend signing up for the patient portal called "MyChart".  Sign up information is provided on this After Visit Summary.  MyChart is used to connect with patients for Virtual Visits (Telemedicine).  Patients are able to view lab/test results, encounter notes, upcoming appointments, etc.  Non-urgent messages can be sent to your provider as well.   To learn more about what you can do with MyChart, go to [email or web address].    Your next appointment:   3 month(s)  The format for your next appointment:   In Person  Provider:   Shirlee More, MD   Other Instructions

## 2020-09-12 ENCOUNTER — Telehealth: Payer: Self-pay

## 2020-09-12 LAB — BASIC METABOLIC PANEL
BUN/Creatinine Ratio: 12 (ref 10–24)
BUN: 12 mg/dL (ref 8–27)
CO2: 24 mmol/L (ref 20–29)
Calcium: 9.8 mg/dL (ref 8.6–10.2)
Chloride: 97 mmol/L (ref 96–106)
Creatinine, Ser: 1.03 mg/dL (ref 0.76–1.27)
Glucose: 120 mg/dL — ABNORMAL HIGH (ref 65–99)
Potassium: 4.2 mmol/L (ref 3.5–5.2)
Sodium: 138 mmol/L (ref 134–144)
eGFR: 83 mL/min/{1.73_m2} (ref >59)

## 2020-09-12 LAB — PRO B NATRIURETIC PEPTIDE: NT-Pro BNP: 669 pg/mL — ABNORMAL HIGH (ref 0–210)

## 2020-09-12 NOTE — Telephone Encounter (Signed)
-----   Message from Richardo Priest, MD sent at 09/12/2020  4:58 PM EDT ----- Good result no changes

## 2020-09-12 NOTE — Telephone Encounter (Signed)
Left message on patients voicemail to please return our call.   

## 2020-09-13 ENCOUNTER — Telehealth: Payer: Self-pay | Admitting: Cardiology

## 2020-09-13 NOTE — Telephone Encounter (Signed)
Pt c/o medication issue:  1. Name of Medication: dapagliflozin propanediol (FARXIGA) 10 MG TABS tablet  2. How are you currently taking this medication (dosage and times per day)? 1 table by mouth  3. Are you having a reaction (difficulty breathing--STAT)? no  4. What is your medication issue? Patient can't afford the cost of the medication. Want to know if Dr. Bettina Gavia has any samples if not put him back on the prior medication.

## 2020-09-25 ENCOUNTER — Other Ambulatory Visit: Payer: Self-pay

## 2020-09-25 NOTE — Patient Outreach (Signed)
[facility]) Care Management  09/25/2020  FREDERIC DULEY 02-27-1960 [id number]   Telephone call to patient for follow up. No answer. Unable to leave a message.  Plan: RN CM will attempt again in the next 4-6 weeks.  [name], RN, MSN [place] Management Care Management Coordinator Direct Line [phone] Cell [phone] Toll Free: [phone]  Fax: [phone]

## 2020-09-26 ENCOUNTER — Encounter: Payer: Self-pay | Admitting: Orthopedic Surgery

## 2020-09-26 DIAGNOSIS — G8929 Other chronic pain: Secondary | ICD-10-CM

## 2020-09-26 DIAGNOSIS — M14672 Charcot's joint, left ankle and foot: Secondary | ICD-10-CM | POA: Diagnosis not present

## 2020-09-26 DIAGNOSIS — M25562 Pain in left knee: Secondary | ICD-10-CM

## 2020-09-26 DIAGNOSIS — S82892B Other fracture of left lower leg, initial encounter for open fracture type I or II: Secondary | ICD-10-CM | POA: Diagnosis not present

## 2020-09-26 MED ORDER — METHYLPREDNISOLONE ACETATE 40 MG/ML IJ SUSP
40.0000 mg | INTRAMUSCULAR | Status: AC | PRN
  Administered 2020-09-26: 40 mg via INTRA_ARTICULAR

## 2020-09-26 MED ORDER — LIDOCAINE HCL (PF) 1 % IJ SOLN
5.0000 mL | INTRAMUSCULAR | Status: AC | PRN
  Administered 2020-09-26: 5 mL

## 2020-09-26 NOTE — Progress Notes (Signed)
Office Visit Note   Patient: David Fitzgerald           Date of Birth: 21-Sep-1960           MRN: [id number] Visit Date: 08/31/2020              Requested by: Angelina Sheriff, MD [address] [place],  [place] [address] PCP: Angelina Sheriff, MD  Chief Complaint  Patient presents with   Left Ankle - Pain      HPI: Patient is a 60 year old gentleman who presents for follow-up left ankle and left knee pain.  Patient complains of pain with weightbearing patient states that oxycodone does not help.  He has a history of patella fracture and status post knee arthroscopy x2.  He is currently wearing compression socks he states his pain is about a 7/10.  Assessment & Plan: Visit Diagnoses:  1. Chronic pain of left knee   2. Open fracture dislocation of left ankle   3. Charcot's joint, left ankle and foot     Plan: Left knee was injected he tolerated this well plan to follow-up in 4 weeks.  Continue with the compression socks for the venous insufficiency.  Plan for three-view radiographs of the left ankle at follow-up.  Follow-Up Instructions: No follow-ups on file.   Ortho Exam  Patient is alert, oriented, no adenopathy, well-dressed, normal affect, normal respiratory effort. Examination of the left knee patient has no effusion no redness no cellulitis.  Collaterals and cruciates are stable.  He has tenderness palpation over the medial and lateral joint line as well as the patellofemoral joint.  Patient has tenderness to palpation over the anterior tibial tubercle.  Imaging: No results found. No images are attached to the encounter.  Labs: Lab Results  Component Value Date   HGBA1C 6.0 (H) 03/08/2020   HGBA1C 6.0 (H) 02/04/2020   REPTSTATUS 03/15/2020 FINAL 03/10/2020   GRAMSTAIN  03/10/2020    MODERATE WBC PRESENT, PREDOMINANTLY PMN NO ORGANISMS SEEN    CULT  03/10/2020    RARE ENTEROBACTER CLOACAE CRITICAL RESULT CALLED TO, READ BACK BY AND VERIFIED WITH: RN  TGilford Rile [id number] FCP NO ANAEROBES ISOLATED Performed at [facility], [place] [address] [place], [place] [address]    [place] 03/10/2020     Lab Results  Component Value Date   ALBUMIN 2.7 (L) 03/10/2020   ALBUMIN 2.9 (L) 03/08/2020   ALBUMIN 2.9 (L) 02/16/2020    Lab Results  Component Value Date   MG 2.1 02/08/2020   MG 2.0 02/04/2020   MG 1.9 02/03/2020   Lab Results  Component Value Date   VD25OH 19.57 (L) 03/08/2020   VD25OH 16.36 (L) 02/16/2020    No results found for: PREALBUMIN CBC EXTENDED Latest Ref Rng & Units 04/06/2020 03/08/2020 02/16/2020  WBC 4.0 - 10.5 K/uL - 10.6(H) 8.4  RBC 4.22 - 5.81 MIL/uL - 4.50 4.16(L)  HGB 13.0 - 17.0 g/dL 8.9(L) 12.7(L) 12.4(L)  HCT 39.0 - 52.0 % 29.4(L) 42.0 38.6(L)  PLT 150 - 400 K/uL - 460(H) 302  NEUTROABS 1.7 - 7.7 K/uL - 7.7 -  LYMPHSABS 0.7 - 4.0 K/uL - 1.5 -     There is no height or weight on file to calculate BMI.  Orders:  Orders Placed This Encounter  Procedures   XR Knee 1-2 Views Left   No orders of the defined types were placed in this encounter.    Procedures:  Large Joint Inj: L knee on 09/26/2020 12:41 PM Indications: pain and diagnostic evaluation Details: 22 G 1.5 in needle, anteromedial approach  Arthrogram: No  Medications: 5 mL lidocaine (PF) 1 %; 40 mg methylPREDNISolone acetate 40 MG/ML Outcome: tolerated well, no immediate complications Procedure, treatment alternatives, risks and benefits explained, specific risks discussed. Consent was given by the patient. Immediately prior to procedure a time out was called to verify the correct patient, procedure, equipment, support staff and site/side marked as required. Patient was prepped and draped in the usual sterile fashion.     Clinical Data: No additional findings.  ROS:  All other systems negative, except as noted in the HPI. Review of Systems  Objective: Vital Signs: There were no vitals taken for this  visit.  Specialty Comments:  No specialty comments available.  PMFS History: Patient Active Problem List   Diagnosis Date Noted   Asthma 07/31/2020   Depression 07/31/2020   Dysrhythmia 07/31/2020   GERD (gastroesophageal reflux disease) 07/31/2020   History of kidney stones 07/31/2020   Pneumonia 07/31/2020   Left ankle pain 04/05/2020   Open dislocation of left ankle    A-fib (HCC)    OSA (obstructive sleep apnea) 03/09/2020   Wound dehiscence, traumatic injury repair, left medial ankle  03/08/2020   Vitamin D deficiency 02/16/2020   Chronic, continuous use of opioids 02/15/2020   Open fracture dislocation of left ankle 01/31/2020   Ankle fracture 01/31/2020   Permanent atrial fibrillation ([place])    Hypertension    Diabetes ([place])    Bee sting allergy    Hypotension 12/25/2017   Chronic anticoagulation 03/07/2017   Coronary artery calcification seen on CT scan 02/07/2017   CAD in native artery 02/07/2017   Persistent atrial fibrillation ([place]) 02/06/2017   Anxiety 02/06/2017   Arthritis 02/06/2017   Pernicious anemia 02/06/2017   Allergy to pollen 04/06/2015   OSA treated with BiPAP 12/14/2014   Obesity hypoventilation syndrome ([place]) 12/14/2014   Morbid obesity with BMI of 40.0-44.9, adult ([place]) 12/14/2014   Hypertensive heart disease 12/14/2014   Hyperlipidemia, mixed 12/14/2014   Type 2 diabetes mellitus with diabetic neuropathy ([place]) 12/14/2014   Chronic venous insufficiency 12/14/2014   Edema 12/14/2014   Morbid obesity ([place]) 12/14/2014   Bell's palsy 02/24/2013   Polyneuropathy in diabetes ([place]) 02/24/2013   Syndrome affecting cervical region 02/24/2013   Trigeminal neuralgia 02/24/2013   Lumbar pain with radiation down left leg 07/09/2012   BMI 45.0-49.9, adult ([place]) 07/09/2012   Chronic pain syndrome 07/09/2012   Morbid obesity with BMI of 45.0-49.9, adult ([place]) 07/09/2012   Pain syndrome, chronic 07/09/2012   Past Medical History:  Diagnosis Date    A-fib ([place])    Allergy to pollen 04/06/2015   Anxiety 02/06/2017   Arthritis 02/06/2017   Asthma    as a child   Bee sting allergy    wasp / mixed vespid   Bell's palsy 02/24/2013   Chronic venous insufficiency 12/14/2014   Chronic, continuous use of opioids 02/15/2020   Depression    Diabetes ([place])    Dysrhythmia    afib   Edema 12/14/2014   GERD (gastroesophageal reflux disease)    History of kidney stones    Hyperlipidemia, mixed 12/14/2014   Hypertension    Lumbar pain with radiation down left leg 07/09/2012   Morbid obesity ([place]) 12/14/2014   Morbid obesity with BMI of 45.0-49.9, adult ([place]) 07/09/2012   Obesity hypoventilation syndrome ([place]) 12/14/2014   OSA (obstructive sleep apnea)  03/09/2020   doesn't use a Cpap   OSA treated with BiPAP 12/14/2014   Pain syndrome, chronic 07/09/2012   Pernicious anemia 02/06/2017   Pneumonia    Polyneuropathy in diabetes ([place]) 02/24/2013   Syndrome affecting cervical region 02/24/2013   Trigeminal neuralgia 02/24/2013   Type 2 diabetes mellitus with diabetic neuropathy ([place]) 12/14/2014   Vitamin D deficiency 02/16/2020   Wound dehiscence, traumatic injury repair, left medial ankle  03/08/2020    Family History  Problem Relation Age of Onset   Breast cancer Mother    Heart disease Father    CAD Father    Atrial fibrillation Father    Stomach cancer Paternal Aunt    Heart disease Paternal Uncle    Breast cancer Maternal Grandmother     Past Surgical History:  Procedure Laterality Date    INTERNAL FIXATION LEFT TIBIA TO CALCANEUS AND APPLY SKIN GRAFT (Left Ankle)  04/05/2020   ADENOIDECTOMY     BACK SURGERY  2002 /2003   CARPAL TUNNEL RELEASE Bilateral    I & D EXTREMITY Left 01/31/2020   Procedure: IRRIGATION AND DEBRIDEMENT ANKLE;  Surgeon: Altamese [place], MD;  Location: [facility];  Service: Orthopedics;  Laterality: Left;   I & D EXTREMITY Left 03/10/2020   Procedure: DEBRIDEMENT LEFT ANKLE, APPLY SKIN GRAFT;  Surgeon: [name], MD;  Location: [facility];  Service: Orthopedics;  Laterality: Left;   KNEE SURGERY Left 2005   ORIF ANKLE FRACTURE Left 01/31/2020   ORIF ANKLE FRACTURE Left 01/31/2020   Procedure: OPEN REDUCTION INTERNAL FIXATION (ORIF) ANKLE FRACTURE;  Surgeon: Altamese Anselmo, MD;  Location: [facility];  Service: Orthopedics;  Laterality: Left;   ORIF ANKLE FRACTURE Left 04/05/2020   Procedure: INTERNAL FIXATION LEFT TIBIA TO CALCANEUS AND APPLY SKIN GRAFT;  Surgeon: [name], MD;  Location: [facility];  Service: Orthopedics;  Laterality: Left;   SHOULDER SURGERY Left 1998   TONSILLECTOMY     Social History   Occupational History   Not on file  Tobacco Use   Smoking status: Former    Packs/day: 3.00    Years: 35.00    Pack years: 105.00    Types: Cigarettes    Quit date: 04/13/2006    Years since quitting: 14.4   Smokeless tobacco: Never  Vaping Use   Vaping Use: Never used  Substance and Sexual Activity   Alcohol use: No    Alcohol/week: 0.0 standard drinks   Drug use: No   Sexual activity: Not on file

## 2020-09-28 ENCOUNTER — Encounter: Payer: Self-pay | Admitting: Orthopedic Surgery

## 2020-09-28 ENCOUNTER — Ambulatory Visit: Payer: Medicare HMO | Admitting: Cardiology

## 2020-09-28 ENCOUNTER — Ambulatory Visit (INDEPENDENT_AMBULATORY_CARE_PROVIDER_SITE_OTHER): Payer: Medicare HMO

## 2020-09-28 ENCOUNTER — Ambulatory Visit: Payer: Medicare HMO | Admitting: Orthopedic Surgery

## 2020-09-28 DIAGNOSIS — L03116 Cellulitis of left lower limb: Secondary | ICD-10-CM | POA: Diagnosis not present

## 2020-09-28 DIAGNOSIS — S82892B Other fracture of left lower leg, initial encounter for open fracture type I or II: Secondary | ICD-10-CM | POA: Diagnosis not present

## 2020-09-28 DIAGNOSIS — M25572 Pain in left ankle and joints of left foot: Secondary | ICD-10-CM

## 2020-09-28 DIAGNOSIS — M14672 Charcot's joint, left ankle and foot: Secondary | ICD-10-CM | POA: Diagnosis not present

## 2020-09-28 MED ORDER — DOXYCYCLINE HYCLATE 100 MG PO TABS: 100.0000 mg | ORAL_TABLET | Freq: Two times a day (BID) | ORAL | 0 refills | Status: DC

## 2020-09-28 NOTE — Progress Notes (Signed)
Office Visit Note   Patient: David Fitzgerald           Date of Birth: May 21, 1960           MRN: [id number] Visit Date: 09/28/2020              Requested by: Angelina Sheriff, MD [address] [place],  [place] [address] PCP: Angelina Sheriff, MD  Chief Complaint  Patient presents with   Left Knee - Follow-up    S/p injection 08/31/20   Left Ankle - Follow-up    04/25/20 ORIF left ankle fx tib- cal fusion kerecis graft            HPI: Patient is a 60 year old gentleman who presents with acute cellulitis and a area of swelling over the anterior medial left tibia which she states has had some bruising and draining.  Patient states that he hit his great toe on the bed today and did have active bleeding.  Patient is status post steroid injection of his left knee about 4 weeks ago.  Patient states this has worked well and provide him relief with his knee symptoms.  Patient is 61 months status post tibial calcaneal fusion with history of traumatic dislocation and Charcot arthropathy of the ankle.  Patient was recently hospitalized for 3 days he states his medicines have been changed quite frequently and still has A. fib.  Assessment & Plan: Visit Diagnoses:  1. Pain in left ankle and joints of left foot   2. Open fracture dislocation of left ankle   3. Charcot's joint, left ankle and foot   4. Cellulitis of left lower leg     Plan: Clinically patient seems to have developed a abscess at the proximal interlocking screw of the tibial calcaneal fusion.  We will start him on doxycycline deep cultures were obtained the ulcer does go all the way down to the hardware.  Clinically it appears that patient had a acute ischemic event from his A. Fib, affecting the circulation to the left lower extremity, now with a foot that is cold to the touch, that previously had a strong pulse and good circulation.  Patient's Charcot arthropathy was caused by the good circulation and insensate  neuropathy.  Will reevaluate on Monday anticipate culture should be available by then.   Follow-Up Instructions: Return in about 2 weeks (around 10/12/2020).   Ortho Exam  Patient is alert, oriented, no adenopathy, well-dressed, normal affect, normal respiratory effort. On examination patient has capillary refill of about 10 seconds in the left toes.  I cannot palpate a pulse the Doppler was used and he has a dampened monophasic dorsalis pedis and posterior tibial pulse.  Patient has an irregular pulse consistent with A. fib.  There is an area of cellulitis over the anterior medial tibial approximately 10 cm in diameter there is an area of fluctuance in the middle of this area of about a centimeter in diameter.  Patient's foot is cold to the touch.  Imaging: No results found. No images are attached to the encounter.  Labs: Lab Results  Component Value Date   HGBA1C 6.0 (H) 03/08/2020   HGBA1C 6.0 (H) 02/04/2020   REPTSTATUS 03/15/2020 FINAL 03/10/2020   GRAMSTAIN  03/10/2020    MODERATE WBC PRESENT, PREDOMINANTLY PMN NO ORGANISMS SEEN    CULT  03/10/2020    RARE ENTEROBACTER CLOACAE CRITICAL RESULT CALLED TO, READ BACK BY AND VERIFIED WITH: RN T. NCYEN [id number] FCP NO  ANAEROBES ISOLATED Performed at [facility], [place] [address] [place], [place] [address]    [place] 03/10/2020     Lab Results  Component Value Date   ALBUMIN 2.7 (L) 03/10/2020   ALBUMIN 2.9 (L) 03/08/2020   ALBUMIN 2.9 (L) 02/16/2020    Lab Results  Component Value Date   MG 2.1 02/08/2020   MG 2.0 02/04/2020   MG 1.9 02/03/2020   Lab Results  Component Value Date   VD25OH 19.57 (L) 03/08/2020   VD25OH 16.36 (L) 02/16/2020    No results found for: PREALBUMIN CBC EXTENDED Latest Ref Rng & Units 04/06/2020 03/08/2020 02/16/2020  WBC 4.0 - 10.5 K/uL - 10.6(H) 8.4  RBC 4.22 - 5.81 MIL/uL - 4.50 4.16(L)  HGB 13.0 - 17.0 g/dL 8.9(L) 12.7(L) 12.4(L)  HCT 39.0 - 52.0 % 29.4(L)  42.0 38.6(L)  PLT 150 - 400 K/uL - 460(H) 302  NEUTROABS 1.7 - 7.7 K/uL - 7.7 -  LYMPHSABS 0.7 - 4.0 K/uL - 1.5 -     There is no height or weight on file to calculate BMI.  Orders:  Orders Placed This Encounter  Procedures   XR Ankle 2 Views Left   No orders of the defined types were placed in this encounter.    Procedures: No procedures performed  Clinical Data: No additional findings.  ROS:  All other systems negative, except as noted in the HPI. Review of Systems  Objective: Vital Signs: There were no vitals taken for this visit.  Specialty Comments:  No specialty comments available.  PMFS History: Patient Active Problem List   Diagnosis Date Noted   Asthma 07/31/2020   Depression 07/31/2020   Dysrhythmia 07/31/2020   GERD (gastroesophageal reflux disease) 07/31/2020   History of kidney stones 07/31/2020   Pneumonia 07/31/2020   Left ankle pain 04/05/2020   Open dislocation of left ankle    A-fib (HCC)    OSA (obstructive sleep apnea) 03/09/2020   Wound dehiscence, traumatic injury repair, left medial ankle  03/08/2020   Vitamin D deficiency 02/16/2020   Chronic, continuous use of opioids 02/15/2020   Open fracture dislocation of left ankle 01/31/2020   Ankle fracture 01/31/2020   Permanent atrial fibrillation ([place])    Hypertension    Diabetes ([place])    Bee sting allergy    Hypotension 12/25/2017   Chronic anticoagulation 03/07/2017   Coronary artery calcification seen on CT scan 02/07/2017   CAD in native artery 02/07/2017   Persistent atrial fibrillation ([place]) 02/06/2017   Anxiety 02/06/2017   Arthritis 02/06/2017   Pernicious anemia 02/06/2017   Allergy to pollen 04/06/2015   OSA treated with BiPAP 12/14/2014   Obesity hypoventilation syndrome ([place]) 12/14/2014   Morbid obesity with BMI of 40.0-44.9, adult ([place]) 12/14/2014   Hypertensive heart disease 12/14/2014   Hyperlipidemia, mixed 12/14/2014   Type 2 diabetes mellitus with diabetic  neuropathy ([place]) 12/14/2014   Chronic venous insufficiency 12/14/2014   Edema 12/14/2014   Morbid obesity ([place]) 12/14/2014   Bell's palsy 02/24/2013   Polyneuropathy in diabetes ([place]) 02/24/2013   Syndrome affecting cervical region 02/24/2013   Trigeminal neuralgia 02/24/2013   Lumbar pain with radiation down left leg 07/09/2012   BMI 45.0-49.9, adult ([place]) 07/09/2012   Chronic pain syndrome 07/09/2012   Morbid obesity with BMI of 45.0-49.9, adult ([place]) 07/09/2012   Pain syndrome, chronic 07/09/2012   Past Medical History:  Diagnosis Date   A-fib ([place])    Allergy to pollen 04/06/2015  Anxiety 02/06/2017   Arthritis 02/06/2017   Asthma    as a child   Bee sting allergy    wasp / mixed vespid   Bell's palsy 02/24/2013   Chronic venous insufficiency 12/14/2014   Chronic, continuous use of opioids 02/15/2020   Depression    Diabetes ([place])    Dysrhythmia    afib   Edema 12/14/2014   GERD (gastroesophageal reflux disease)    History of kidney stones    Hyperlipidemia, mixed 12/14/2014   Hypertension    Lumbar pain with radiation down left leg 07/09/2012   Morbid obesity ([place]) 12/14/2014   Morbid obesity with BMI of 45.0-49.9, adult ([place]) 07/09/2012   Obesity hypoventilation syndrome ([place]) 12/14/2014   OSA (obstructive sleep apnea) 03/09/2020   doesn't use a Cpap   OSA treated with BiPAP 12/14/2014   Pain syndrome, chronic 07/09/2012   Pernicious anemia 02/06/2017   Pneumonia    Polyneuropathy in diabetes ([place]) 02/24/2013   Syndrome affecting cervical region 02/24/2013   Trigeminal neuralgia 02/24/2013   Type 2 diabetes mellitus with diabetic neuropathy ([place]) 12/14/2014   Vitamin D deficiency 02/16/2020   Wound dehiscence, traumatic injury repair, left medial ankle  03/08/2020    Family History  Problem Relation Age of Onset   Breast cancer Mother    Heart disease Father    CAD Father    Atrial fibrillation Father    Stomach cancer Paternal Aunt    Heart disease Paternal Uncle    Breast  cancer Maternal Grandmother     Past Surgical History:  Procedure Laterality Date    INTERNAL FIXATION LEFT TIBIA TO CALCANEUS AND APPLY SKIN GRAFT (Left Ankle)  04/05/2020   ADENOIDECTOMY     BACK SURGERY  2002 /2003   CARPAL TUNNEL RELEASE Bilateral    I & D EXTREMITY Left 01/31/2020   Procedure: IRRIGATION AND DEBRIDEMENT ANKLE;  Surgeon: Altamese [place], MD;  Location: [facility];  Service: Orthopedics;  Laterality: Left;   I & D EXTREMITY Left 03/10/2020   Procedure: DEBRIDEMENT LEFT ANKLE, APPLY SKIN GRAFT;  Surgeon: [name], MD;  Location: [facility];  Service: Orthopedics;  Laterality: Left;   KNEE SURGERY Left 2005   ORIF ANKLE FRACTURE Left 01/31/2020   ORIF ANKLE FRACTURE Left 01/31/2020   Procedure: OPEN REDUCTION INTERNAL FIXATION (ORIF) ANKLE FRACTURE;  Surgeon: Altamese [place], MD;  Location: [facility];  Service: Orthopedics;  Laterality: Left;   ORIF ANKLE FRACTURE Left 04/05/2020   Procedure: INTERNAL FIXATION LEFT TIBIA TO CALCANEUS AND APPLY SKIN GRAFT;  Surgeon: [name], MD;  Location: [facility];  Service: Orthopedics;  Laterality: Left;   SHOULDER SURGERY Left 1998   TONSILLECTOMY     Social History   Occupational History   Not on file  Tobacco Use   Smoking status: Former    Packs/day: 3.00    Years: 35.00    Pack years: 105.00    Types: Cigarettes    Quit date: 04/13/2006    Years since quitting: 14.4   Smokeless tobacco: Never  Vaping Use   Vaping Use: Never used  Substance and Sexual Activity   Alcohol use: No    Alcohol/week: 0.0 standard drinks   Drug use: No   Sexual activity: Not on file

## 2020-09-28 NOTE — Addendum Note (Signed)
Addended by: Pamella Pert on: 09/28/2020 01:44 PM   Modules accepted: Orders

## 2020-09-30 DIAGNOSIS — S9305XD Dislocation of left ankle joint, subsequent encounter: Secondary | ICD-10-CM | POA: Diagnosis not present

## 2020-09-30 DIAGNOSIS — Z48817 Encounter for surgical aftercare following surgery on the skin and subcutaneous tissue: Secondary | ICD-10-CM | POA: Diagnosis not present

## 2020-10-01 LAB — WOUND CULTURE
MICRO NUMBER:: 12291430
SPECIMEN QUALITY:: ADEQUATE

## 2020-10-02 ENCOUNTER — Encounter: Payer: Self-pay | Admitting: Orthopedic Surgery

## 2020-10-02 ENCOUNTER — Other Ambulatory Visit: Payer: Self-pay | Admitting: Physician Assistant

## 2020-10-02 ENCOUNTER — Telehealth: Payer: Self-pay | Admitting: Orthopedic Surgery

## 2020-10-02 ENCOUNTER — Ambulatory Visit: Payer: Medicare HMO | Admitting: Orthopedic Surgery

## 2020-10-02 ENCOUNTER — Other Ambulatory Visit: Payer: Self-pay

## 2020-10-02 DIAGNOSIS — T847XXA Infection and inflammatory reaction due to other internal orthopedic prosthetic devices, implants and grafts, initial encounter: Secondary | ICD-10-CM | POA: Diagnosis not present

## 2020-10-02 NOTE — Progress Notes (Signed)
Office Visit Note   Patient: David Fitzgerald           Date of Birth: 02-10-1960           MRN: [id number] Visit Date: 10/02/2020              Requested by: Angelina Sheriff, MD [address] [place],  [place] [address] PCP: Angelina Sheriff, MD  Chief Complaint  Patient presents with   Left Leg - Follow-up      HPI: Patient is a 60 year old gentleman who presents in follow-up with an acute episode of cellulitis medial left leg at the area of the proximal interlocking screw from the tibial calcaneal fusion for open Charcot collapse.  Patient states he was doing well until he was most recently hospitalized with A. fib.  Patient has developed ischemic changes to his foot though still has a good dorsalis pedis pulse.  He was started on oral antibiotics cultures were positive for routine skin flora deep within the wound no MRSA.  Despite oral antibiotics the drainage swelling and redness has worsened.  Assessment & Plan: Visit Diagnoses:  1. Hardware complicating wound infection, initial encounter [facility])     Plan: With the worsening cellulitis and drainage and increased pain discussed with the patient that his best option is to proceed with a transtibial amputation.  Patient will hold on the Eliquis with anticipated surgery on Wednesday.  Risks and benefits were discussed including wound breakdown need for additional surgery.  Patient and family state they understand and wish to proceed at this time.  Follow-Up Instructions: Return in about 2 weeks (around 10/16/2020).   Ortho Exam  Patient is alert, oriented, no adenopathy, well-dressed, normal affect, normal respiratory effort. Examination patient still has a foot that is cool to the touch previous capillary refill was greater than 10 seconds.  Capillary refill today is approximately 5 seconds.  He has a strongly palpable dorsalis pedis pulse.  The area of cellulitis has increased after starting antibiotics he has weeping edema  from his leg with clear serous drainage.  The cultures showed routine skin flora deep within the wound against the hardware.  Patient complains of increased pain.  His previous traumatic wound has healed well with no signs of breakdown or infection.  Imaging: No results found. No images are attached to the encounter.  Labs: Lab Results  Component Value Date   HGBA1C 6.0 (H) 03/08/2020   HGBA1C 6.0 (H) 02/04/2020   REPTSTATUS 03/15/2020 FINAL 03/10/2020   GRAMSTAIN  03/10/2020    MODERATE WBC PRESENT, PREDOMINANTLY PMN NO ORGANISMS SEEN    CULT  03/10/2020    RARE ENTEROBACTER CLOACAE CRITICAL RESULT CALLED TO, READ BACK BY AND VERIFIED WITH: RN TGilford Rile [id number] FCP NO ANAEROBES ISOLATED Performed at [facility], [place] [address], [place], [place] [address]    [place] 03/10/2020     Lab Results  Component Value Date   ALBUMIN 2.7 (L) 03/10/2020   ALBUMIN 2.9 (L) 03/08/2020   ALBUMIN 2.9 (L) 02/16/2020    Lab Results  Component Value Date   MG 2.1 02/08/2020   MG 2.0 02/04/2020   MG 1.9 02/03/2020   Lab Results  Component Value Date   VD25OH 19.57 (L) 03/08/2020   VD25OH 16.36 (L) 02/16/2020    No results found for: PREALBUMIN CBC EXTENDED Latest Ref Rng & Units 04/06/2020 03/08/2020 02/16/2020  WBC 4.0 - 10.5 K/uL - 10.6(H) 8.4  RBC 4.22 - 5.81 MIL/uL - 4.50 4.16(L)  HGB 13.0 - 17.0 g/dL 8.9(L) 12.7(L) 12.4(L)  HCT 39.0 - 52.0 % 29.4(L) 42.0 38.6(L)  PLT 150 - 400 K/uL - 460(H) 302  NEUTROABS 1.7 - 7.7 K/uL - 7.7 -  LYMPHSABS 0.7 - 4.0 K/uL - 1.5 -     There is no height or weight on file to calculate BMI.  Orders:  No orders of the defined types were placed in this encounter.  No orders of the defined types were placed in this encounter.    Procedures: No procedures performed  Clinical Data: No additional findings.  ROS:  All other systems negative, except as noted in the HPI. Review of Systems  Objective: Vital  Signs: There were no vitals taken for this visit.  Specialty Comments:  No specialty comments available.  PMFS History: Patient Active Problem List   Diagnosis Date Noted   Asthma 07/31/2020   Depression 07/31/2020   Dysrhythmia 07/31/2020   GERD (gastroesophageal reflux disease) 07/31/2020   History of kidney stones 07/31/2020   Pneumonia 07/31/2020   Left ankle pain 04/05/2020   Open dislocation of left ankle    A-fib (HCC)    OSA (obstructive sleep apnea) 03/09/2020   Wound dehiscence, traumatic injury repair, left medial ankle  03/08/2020   Vitamin D deficiency 02/16/2020   Chronic, continuous use of opioids 02/15/2020   Open fracture dislocation of left ankle 01/31/2020   Ankle fracture 01/31/2020   Permanent atrial fibrillation ([place])    Hypertension    Diabetes ([place])    Bee sting allergy    Hypotension 12/25/2017   Chronic anticoagulation 03/07/2017   Coronary artery calcification seen on CT scan 02/07/2017   CAD in native artery 02/07/2017   Persistent atrial fibrillation ([place]) 02/06/2017   Anxiety 02/06/2017   Arthritis 02/06/2017   Pernicious anemia 02/06/2017   Allergy to pollen 04/06/2015   OSA treated with BiPAP 12/14/2014   Obesity hypoventilation syndrome ([place]) 12/14/2014   Morbid obesity with BMI of 40.0-44.9, adult ([place]) 12/14/2014   Hypertensive heart disease 12/14/2014   Hyperlipidemia, mixed 12/14/2014   Type 2 diabetes mellitus with diabetic neuropathy ([place]) 12/14/2014   Chronic venous insufficiency 12/14/2014   Edema 12/14/2014   Morbid obesity ([place]) 12/14/2014   Bell's palsy 02/24/2013   Polyneuropathy in diabetes ([place]) 02/24/2013   Syndrome affecting cervical region 02/24/2013   Trigeminal neuralgia 02/24/2013   Lumbar pain with radiation down left leg 07/09/2012   BMI 45.0-49.9, adult ([place]) 07/09/2012   Chronic pain syndrome 07/09/2012   Morbid obesity with BMI of 45.0-49.9, adult ([place]) 07/09/2012   Pain syndrome, chronic 07/09/2012    Past Medical History:  Diagnosis Date   A-fib ([place])    Allergy to pollen 04/06/2015   Anxiety 02/06/2017   Arthritis 02/06/2017   Asthma    as a child   Bee sting allergy    wasp / mixed vespid   Bell's palsy 02/24/2013   Chronic venous insufficiency 12/14/2014   Chronic, continuous use of opioids 02/15/2020   Depression    Diabetes ([place])    Dysrhythmia    afib   Edema 12/14/2014   GERD (gastroesophageal reflux disease)    History of kidney stones    Hyperlipidemia, mixed 12/14/2014   Hypertension    Lumbar pain with radiation down left leg 07/09/2012   Morbid obesity ([place]) 12/14/2014   Morbid obesity with BMI of 45.0-49.9, adult ([place]) 07/09/2012   Obesity hypoventilation syndrome ([place]) 12/14/2014  OSA (obstructive sleep apnea) 03/09/2020   doesn't use a Cpap   OSA treated with BiPAP 12/14/2014   Pain syndrome, chronic 07/09/2012   Pernicious anemia 02/06/2017   Pneumonia    Polyneuropathy in diabetes ([place]) 02/24/2013   Syndrome affecting cervical region 02/24/2013   Trigeminal neuralgia 02/24/2013   Type 2 diabetes mellitus with diabetic neuropathy ([place]) 12/14/2014   Vitamin D deficiency 02/16/2020   Wound dehiscence, traumatic injury repair, left medial ankle  03/08/2020    Family History  Problem Relation Age of Onset   Breast cancer Mother    Heart disease Father    CAD Father    Atrial fibrillation Father    Stomach cancer Paternal Aunt    Heart disease Paternal Uncle    Breast cancer Maternal Grandmother     Past Surgical History:  Procedure Laterality Date    INTERNAL FIXATION LEFT TIBIA TO CALCANEUS AND APPLY SKIN GRAFT (Left Ankle)  04/05/2020   ADENOIDECTOMY     BACK SURGERY  2002 /2003   CARPAL TUNNEL RELEASE Bilateral    I & D EXTREMITY Left 01/31/2020   Procedure: IRRIGATION AND DEBRIDEMENT ANKLE;  Surgeon: Altamese Kindred, MD;  Location: [facility];  Service: Orthopedics;  Laterality: Left;   I & D EXTREMITY Left 03/10/2020   Procedure: DEBRIDEMENT LEFT ANKLE, APPLY SKIN GRAFT;   Surgeon: [name], MD;  Location: [facility];  Service: Orthopedics;  Laterality: Left;   KNEE SURGERY Left 2005   ORIF ANKLE FRACTURE Left 01/31/2020   ORIF ANKLE FRACTURE Left 01/31/2020   Procedure: OPEN REDUCTION INTERNAL FIXATION (ORIF) ANKLE FRACTURE;  Surgeon: Altamese , MD;  Location: [facility];  Service: Orthopedics;  Laterality: Left;   ORIF ANKLE FRACTURE Left 04/05/2020   Procedure: INTERNAL FIXATION LEFT TIBIA TO CALCANEUS AND APPLY SKIN GRAFT;  Surgeon: [name], MD;  Location: [facility];  Service: Orthopedics;  Laterality: Left;   SHOULDER SURGERY Left 1998   TONSILLECTOMY     Social History   Occupational History   Not on file  Tobacco Use   Smoking status: Former    Packs/day: 3.00    Years: 35.00    Pack years: 105.00    Types: Cigarettes    Quit date: 04/13/2006    Years since quitting: 14.4   Smokeless tobacco: Never  Vaping Use   Vaping Use: Never used  Substance and Sexual Activity   Alcohol use: No    Alcohol/week: 0.0 standard drinks   Drug use: No   Sexual activity: Not on file

## 2020-10-02 NOTE — Telephone Encounter (Signed)
Patient called. He would like a RX for a wheelchair. His call back number is [phone]

## 2020-10-03 ENCOUNTER — Encounter (HOSPITAL_COMMUNITY): Payer: Self-pay | Admitting: Orthopedic Surgery

## 2020-10-03 ENCOUNTER — Other Ambulatory Visit: Payer: Self-pay

## 2020-10-03 DIAGNOSIS — M1612 Unilateral primary osteoarthritis, left hip: Secondary | ICD-10-CM | POA: Diagnosis not present

## 2020-10-03 DIAGNOSIS — Z1389 Encounter for screening for other disorder: Secondary | ICD-10-CM | POA: Diagnosis not present

## 2020-10-03 DIAGNOSIS — M5136 Other intervertebral disc degeneration, lumbar region: Secondary | ICD-10-CM | POA: Diagnosis not present

## 2020-10-03 DIAGNOSIS — M47818 Spondylosis without myelopathy or radiculopathy, sacral and sacrococcygeal region: Secondary | ICD-10-CM | POA: Diagnosis not present

## 2020-10-03 DIAGNOSIS — M25552 Pain in left hip: Secondary | ICD-10-CM | POA: Diagnosis not present

## 2020-10-03 DIAGNOSIS — G894 Chronic pain syndrome: Secondary | ICD-10-CM | POA: Diagnosis not present

## 2020-10-03 DIAGNOSIS — M47816 Spondylosis without myelopathy or radiculopathy, lumbar region: Secondary | ICD-10-CM | POA: Diagnosis not present

## 2020-10-03 DIAGNOSIS — Z79891 Long term (current) use of opiate analgesic: Secondary | ICD-10-CM | POA: Diagnosis not present

## 2020-10-03 NOTE — Telephone Encounter (Signed)
Autumn usually orders these through [employer] I do have no idea how to do that.  She will be back on Thursday.

## 2020-10-03 NOTE — Progress Notes (Signed)
Pt voiced understanding of new arrival time of 1000 Voiced understanding of ERAS instructions- stop drinking clear liquids at 0945.

## 2020-10-03 NOTE — Progress Notes (Signed)
[name] denies chest pain or shortness of breath. Patient denies having any s/s of Covid in his household.  Patient denies any known exposure to Covid.  Mr. Donate has type II DM, patient does not check CBG or have a CBG machine. I   instructed patient to not take                             medications for  diabetes in am.  PCP is Dr. Lin Landsman in [employer]. Cardiologist is Dr. Bettina Gavia.  I instructed Mr. Mikle to stop vitamins, naproxen and tumeric.

## 2020-10-04 ENCOUNTER — Inpatient Hospital Stay (HOSPITAL_COMMUNITY)
Admission: RE | Admit: 2020-10-04 | Discharge: 2020-10-12 | DRG: 475 | Disposition: A | Discharge status: Skilled Nursing Facility | Payer: Medicare HMO | Attending: Orthopedic Surgery | Admitting: Orthopedic Surgery

## 2020-10-04 ENCOUNTER — Other Ambulatory Visit: Payer: Self-pay

## 2020-10-04 ENCOUNTER — Inpatient Hospital Stay (HOSPITAL_COMMUNITY): Payer: Medicare HMO | Admitting: Anesthesiology

## 2020-10-04 ENCOUNTER — Encounter (HOSPITAL_COMMUNITY): Payer: Self-pay | Admitting: Orthopedic Surgery

## 2020-10-04 ENCOUNTER — Encounter (HOSPITAL_COMMUNITY)
Admission: RE | Disposition: A | Discharge status: Skilled Nursing Facility | Payer: Self-pay | Source: Home / Self Care | Attending: Orthopedic Surgery

## 2020-10-04 DIAGNOSIS — I872 Venous insufficiency (chronic) (peripheral): Secondary | ICD-10-CM | POA: Diagnosis present

## 2020-10-04 DIAGNOSIS — Z885 Allergy status to narcotic agent status: Secondary | ICD-10-CM | POA: Diagnosis not present

## 2020-10-04 DIAGNOSIS — R2681 Unsteadiness on feet: Secondary | ICD-10-CM | POA: Diagnosis not present

## 2020-10-04 DIAGNOSIS — I480 Paroxysmal atrial fibrillation: Secondary | ICD-10-CM | POA: Diagnosis not present

## 2020-10-04 DIAGNOSIS — L03116 Cellulitis of left lower limb: Secondary | ICD-10-CM | POA: Diagnosis present

## 2020-10-04 DIAGNOSIS — I10 Essential (primary) hypertension: Secondary | ICD-10-CM | POA: Diagnosis present

## 2020-10-04 DIAGNOSIS — E119 Type 2 diabetes mellitus without complications: Secondary | ICD-10-CM | POA: Diagnosis not present

## 2020-10-04 DIAGNOSIS — G4733 Obstructive sleep apnea (adult) (pediatric): Secondary | ICD-10-CM | POA: Diagnosis present

## 2020-10-04 DIAGNOSIS — T8469XA Infection and inflammatory reaction due to internal fixation device of other site, initial encounter: Secondary | ICD-10-CM | POA: Diagnosis not present

## 2020-10-04 DIAGNOSIS — L02419 Cutaneous abscess of limb, unspecified: Secondary | ICD-10-CM

## 2020-10-04 DIAGNOSIS — Z87891 Personal history of nicotine dependence: Secondary | ICD-10-CM | POA: Diagnosis not present

## 2020-10-04 DIAGNOSIS — M71072 Abscess of bursa, left ankle and foot: Secondary | ICD-10-CM

## 2020-10-04 DIAGNOSIS — Z89512 Acquired absence of left leg below knee: Secondary | ICD-10-CM | POA: Diagnosis not present

## 2020-10-04 DIAGNOSIS — Z20822 Contact with and (suspected) exposure to covid-19: Secondary | ICD-10-CM | POA: Diagnosis present

## 2020-10-04 DIAGNOSIS — Z79899 Other long term (current) drug therapy: Secondary | ICD-10-CM

## 2020-10-04 DIAGNOSIS — R0902 Hypoxemia: Secondary | ICD-10-CM | POA: Diagnosis not present

## 2020-10-04 DIAGNOSIS — E1142 Type 2 diabetes mellitus with diabetic polyneuropathy: Secondary | ICD-10-CM | POA: Diagnosis present

## 2020-10-04 DIAGNOSIS — Z880 Allergy status to penicillin: Secondary | ICD-10-CM | POA: Diagnosis not present

## 2020-10-04 DIAGNOSIS — Z800 Family history of malignant neoplasm of digestive organs: Secondary | ICD-10-CM

## 2020-10-04 DIAGNOSIS — M86272 Subacute osteomyelitis, left ankle and foot: Secondary | ICD-10-CM

## 2020-10-04 DIAGNOSIS — S9305XD Dislocation of left ankle joint, subsequent encounter: Secondary | ICD-10-CM

## 2020-10-04 DIAGNOSIS — E782 Mixed hyperlipidemia: Secondary | ICD-10-CM | POA: Diagnosis present

## 2020-10-04 DIAGNOSIS — Z803 Family history of malignant neoplasm of breast: Secondary | ICD-10-CM | POA: Diagnosis not present

## 2020-10-04 DIAGNOSIS — Z91030 Bee allergy status: Secondary | ICD-10-CM | POA: Diagnosis not present

## 2020-10-04 DIAGNOSIS — R41841 Cognitive communication deficit: Secondary | ICD-10-CM | POA: Diagnosis not present

## 2020-10-04 DIAGNOSIS — F339 Major depressive disorder, recurrent, unspecified: Secondary | ICD-10-CM | POA: Diagnosis not present

## 2020-10-04 DIAGNOSIS — W1830XS Fall on same level, unspecified, sequela: Secondary | ICD-10-CM | POA: Diagnosis not present

## 2020-10-04 DIAGNOSIS — Z8249 Family history of ischemic heart disease and other diseases of the circulatory system: Secondary | ICD-10-CM | POA: Diagnosis not present

## 2020-10-04 DIAGNOSIS — M6281 Muscle weakness (generalized): Secondary | ICD-10-CM | POA: Diagnosis not present

## 2020-10-04 DIAGNOSIS — K219 Gastro-esophageal reflux disease without esophagitis: Secondary | ICD-10-CM | POA: Diagnosis present

## 2020-10-04 DIAGNOSIS — Z7401 Bed confinement status: Secondary | ICD-10-CM | POA: Diagnosis not present

## 2020-10-04 DIAGNOSIS — S82872S Displaced pilon fracture of left tibia, sequela: Secondary | ICD-10-CM | POA: Diagnosis not present

## 2020-10-04 DIAGNOSIS — M7989 Other specified soft tissue disorders: Secondary | ICD-10-CM | POA: Diagnosis not present

## 2020-10-04 DIAGNOSIS — Z4781 Encounter for orthopedic aftercare following surgical amputation: Secondary | ICD-10-CM | POA: Diagnosis not present

## 2020-10-04 DIAGNOSIS — G8918 Other acute postprocedural pain: Secondary | ICD-10-CM | POA: Diagnosis not present

## 2020-10-04 DIAGNOSIS — Z87442 Personal history of urinary calculi: Secondary | ICD-10-CM | POA: Diagnosis not present

## 2020-10-04 DIAGNOSIS — I4891 Unspecified atrial fibrillation: Secondary | ICD-10-CM | POA: Diagnosis present

## 2020-10-04 DIAGNOSIS — M86172 Other acute osteomyelitis, left ankle and foot: Secondary | ICD-10-CM | POA: Diagnosis not present

## 2020-10-04 DIAGNOSIS — Z886 Allergy status to analgesic agent status: Secondary | ICD-10-CM

## 2020-10-04 DIAGNOSIS — I4819 Other persistent atrial fibrillation: Secondary | ICD-10-CM | POA: Diagnosis not present

## 2020-10-04 DIAGNOSIS — M86162 Other acute osteomyelitis, left tibia and fibula: Secondary | ICD-10-CM | POA: Diagnosis not present

## 2020-10-04 DIAGNOSIS — L02416 Cutaneous abscess of left lower limb: Secondary | ICD-10-CM | POA: Diagnosis present

## 2020-10-04 DIAGNOSIS — Z6841 Body Mass Index (BMI) 40.0 and over, adult: Secondary | ICD-10-CM | POA: Diagnosis not present

## 2020-10-04 DIAGNOSIS — M1990 Unspecified osteoarthritis, unspecified site: Secondary | ICD-10-CM | POA: Diagnosis present

## 2020-10-04 DIAGNOSIS — L089 Local infection of the skin and subcutaneous tissue, unspecified: Secondary | ICD-10-CM | POA: Diagnosis not present

## 2020-10-04 DIAGNOSIS — S91002D Unspecified open wound, left ankle, subsequent encounter: Principal | ICD-10-CM

## 2020-10-04 DIAGNOSIS — J449 Chronic obstructive pulmonary disease, unspecified: Secondary | ICD-10-CM | POA: Diagnosis not present

## 2020-10-04 DIAGNOSIS — I4821 Permanent atrial fibrillation: Secondary | ICD-10-CM | POA: Diagnosis not present

## 2020-10-04 HISTORY — DX: Irritable bowel syndrome, unspecified: K58.9

## 2020-10-04 HISTORY — PX: AMPUTATION: SHX166

## 2020-10-04 HISTORY — DX: Abscess of bursa, left ankle and foot: M71.072

## 2020-10-04 LAB — SARS CORONAVIRUS 2 BY RT PCR (HOSPITAL ORDER, PERFORMED IN ~~LOC~~ HOSPITAL LAB): SARS Coronavirus 2: NEGATIVE

## 2020-10-04 LAB — CBC
HCT: 44.4 % (ref 39.0–52.0)
Hemoglobin: 13.3 g/dL (ref 13.0–17.0)
MCH: 25.5 pg — ABNORMAL LOW (ref 26.0–34.0)
MCHC: 30 g/dL (ref 30.0–36.0)
MCV: 85.1 fL (ref 80.0–100.0)
Platelets: 327 10*3/uL (ref 150–400)
RBC: 5.22 MIL/uL (ref 4.22–5.81)
RDW: 19.1 % — ABNORMAL HIGH (ref 11.5–15.5)
WBC: 11.8 10*3/uL — ABNORMAL HIGH (ref 4.0–10.5)
nRBC: 0 % (ref 0.0–0.2)

## 2020-10-04 LAB — BASIC METABOLIC PANEL
Anion gap: 10 (ref 5–15)
BUN: 12 mg/dL (ref 6–20)
CO2: 30 mmol/L (ref 22–32)
Calcium: 9.1 mg/dL (ref 8.9–10.3)
Chloride: 95 mmol/L — ABNORMAL LOW (ref 98–111)
Creatinine, Ser: 1.05 mg/dL (ref 0.61–1.24)
GFR, Estimated: >60 mL/min (ref >60)
Glucose, Bld: 144 mg/dL — ABNORMAL HIGH (ref 70–99)
Potassium: 3.6 mmol/L (ref 3.5–5.1)
Sodium: 135 mmol/L (ref 135–145)

## 2020-10-04 LAB — GLUCOSE, CAPILLARY
Glucose-Capillary: 155 mg/dL — ABNORMAL HIGH (ref 70–99)
Glucose-Capillary: 122 mg/dL — ABNORMAL HIGH (ref 70–99)

## 2020-10-04 LAB — TYPE AND SCREEN
ABO/RH(D): A NEG
Antibody Screen: NEGATIVE

## 2020-10-04 LAB — ABO/RH: ABO/RH(D): A NEG

## 2020-10-04 SURGERY — AMPUTATION BELOW KNEE
Anesthesia: General | Site: Knee | Laterality: Left

## 2020-10-04 MED ORDER — OXYCODONE HCL 5 MG PO TABS
5.0000 mg | ORAL_TABLET | ORAL | Status: DC | PRN
  Filled 2020-10-04: qty 2

## 2020-10-04 MED ORDER — CHLORHEXIDINE GLUCONATE 0.12 % MT SOLN
15.0000 mL | Freq: Once | OROMUCOSAL | Status: AC
  Administered 2020-10-04: 15 mL via OROMUCOSAL
  Filled 2020-10-04: qty 15

## 2020-10-04 MED ORDER — FENTANYL CITRATE (PF) 250 MCG/5ML IJ SOLN
INTRAMUSCULAR | Status: AC
  Filled 2020-10-04: qty 5

## 2020-10-04 MED ORDER — METOPROLOL TARTRATE 5 MG/5ML IV SOLN
2.0000 mg | INTRAVENOUS | Status: DC | PRN
  Administered 2020-10-05: 3 mg via INTRAVENOUS
  Filled 2020-10-04: qty 5

## 2020-10-04 MED ORDER — GUAIFENESIN-DM 100-10 MG/5ML PO SYRP
15.0000 mL | ORAL_SOLUTION | ORAL | Status: DC | PRN
  Administered 2020-10-07 – 2020-10-12 (×15): 15 mL via ORAL
  Filled 2020-10-04 (×16): qty 20

## 2020-10-04 MED ORDER — DULOXETINE HCL 60 MG PO CPEP
60.0000 mg | ORAL_CAPSULE | Freq: Every day | ORAL | Status: DC
  Administered 2020-10-05 – 2020-10-12 (×8): 60 mg via ORAL
  Filled 2020-10-04 (×8): qty 1

## 2020-10-04 MED ORDER — LACTATED RINGERS IV SOLN: INTRAVENOUS | Status: DC

## 2020-10-04 MED ORDER — ACETAMINOPHEN 325 MG PO TABS: 325.0000 mg | ORAL_TABLET | Freq: Four times a day (QID) | ORAL | Status: DC | PRN

## 2020-10-04 MED ORDER — ONDANSETRON HCL 4 MG/2ML IJ SOLN
INTRAMUSCULAR | Status: DC | PRN
  Administered 2020-10-04: 4 mg via INTRAVENOUS

## 2020-10-04 MED ORDER — POLYETHYLENE GLYCOL 3350 17 G PO PACK: 17.0000 g | PACK | Freq: Every day | ORAL | Status: DC | PRN

## 2020-10-04 MED ORDER — VITAMIN B-12 1000 MCG PO TABS
1000.0000 ug | ORAL_TABLET | Freq: Every day | ORAL | Status: DC
  Administered 2020-10-04 – 2020-10-12 (×9): 1000 ug via ORAL
  Filled 2020-10-04 (×9): qty 1

## 2020-10-04 MED ORDER — FUROSEMIDE 40 MG PO TABS
40.0000 mg | ORAL_TABLET | Freq: Every day | ORAL | Status: DC
  Administered 2020-10-04 – 2020-10-12 (×9): 40 mg via ORAL
  Filled 2020-10-04 (×9): qty 1

## 2020-10-04 MED ORDER — OXYCODONE HCL 5 MG PO TABS
10.0000 mg | ORAL_TABLET | ORAL | Status: DC | PRN
  Administered 2020-10-05 – 2020-10-12 (×30): 15 mg via ORAL
  Administered 2020-10-12: 10 mg via ORAL
  Filled 2020-10-04 (×30): qty 3

## 2020-10-04 MED ORDER — DICYCLOMINE HCL 20 MG PO TABS
20.0000 mg | ORAL_TABLET | Freq: Every day | ORAL | Status: DC
  Administered 2020-10-04 – 2020-10-11 (×8): 20 mg via ORAL
  Filled 2020-10-04 (×9): qty 1

## 2020-10-04 MED ORDER — FENTANYL CITRATE (PF) 100 MCG/2ML IJ SOLN
INTRAMUSCULAR | Status: AC
  Administered 2020-10-04: 25 ug
  Filled 2020-10-04: qty 2

## 2020-10-04 MED ORDER — GABAPENTIN 600 MG PO TABS
600.0000 mg | ORAL_TABLET | Freq: Three times a day (TID) | ORAL | Status: DC
  Administered 2020-10-04 – 2020-10-12 (×25): 600 mg via ORAL
  Filled 2020-10-04 (×27): qty 1

## 2020-10-04 MED ORDER — ONDANSETRON HCL 4 MG/2ML IJ SOLN
INTRAMUSCULAR | Status: AC
  Filled 2020-10-04: qty 2

## 2020-10-04 MED ORDER — FENTANYL CITRATE (PF) 100 MCG/2ML IJ SOLN
INTRAMUSCULAR | Status: DC | PRN
  Administered 2020-10-04 (×2): 25 ug via INTRAVENOUS

## 2020-10-04 MED ORDER — FENTANYL CITRATE PF 50 MCG/ML IJ SOSY
25.0000 ug | PREFILLED_SYRINGE | Freq: Once | INTRAMUSCULAR | Status: DC
  Filled 2020-10-04: qty 1

## 2020-10-04 MED ORDER — SIMVASTATIN 20 MG PO TABS
20.0000 mg | ORAL_TABLET | Freq: Every day | ORAL | Status: DC
  Administered 2020-10-04 – 2020-10-11 (×8): 20 mg via ORAL
  Filled 2020-10-04 (×7): qty 1

## 2020-10-04 MED ORDER — 0.9 % SODIUM CHLORIDE (POUR BTL) OPTIME
TOPICAL | Status: DC | PRN
  Administered 2020-10-04: 1000 mL

## 2020-10-04 MED ORDER — ONDANSETRON HCL 4 MG/2ML IJ SOLN: 4.0000 mg | Freq: Four times a day (QID) | INTRAMUSCULAR | Status: DC | PRN

## 2020-10-04 MED ORDER — LACTATED RINGERS IV SOLN: INTRAVENOUS | Status: DC | PRN

## 2020-10-04 MED ORDER — NITROGLYCERIN 0.4 MG SL SUBL: 0.4000 mg | SUBLINGUAL_TABLET | SUBLINGUAL | Status: DC | PRN

## 2020-10-04 MED ORDER — ONDANSETRON HCL 4 MG/2ML IJ SOLN: 4.0000 mg | Freq: Once | INTRAMUSCULAR | Status: DC | PRN

## 2020-10-04 MED ORDER — LIDOCAINE 2% (20 MG/ML) 5 ML SYRINGE
INTRAMUSCULAR | Status: DC | PRN
Start: 2020-10-04 — End: 2020-10-04
  Administered 2020-10-04: 100 mg via INTRAVENOUS

## 2020-10-04 MED ORDER — OXYCODONE HCL 5 MG/5ML PO SOLN: 5.0000 mg | Freq: Once | ORAL | Status: DC | PRN

## 2020-10-04 MED ORDER — OXYCODONE HCL 5 MG PO TABS: 5.0000 mg | ORAL_TABLET | Freq: Once | ORAL | Status: DC | PRN

## 2020-10-04 MED ORDER — DOCUSATE SODIUM 100 MG PO CAPS
100.0000 mg | ORAL_CAPSULE | Freq: Every day | ORAL | Status: DC
  Administered 2020-10-06 – 2020-10-12 (×6): 100 mg via ORAL
  Filled 2020-10-04 (×8): qty 1

## 2020-10-04 MED ORDER — PHENOL 1.4 % MT LIQD: 1.0000 | OROMUCOSAL | Status: DC | PRN

## 2020-10-04 MED ORDER — ZINC SULFATE 220 (50 ZN) MG PO CAPS
220.0000 mg | ORAL_CAPSULE | Freq: Every day | ORAL | Status: DC
Start: 2020-10-04 — End: 2020-10-13
  Administered 2020-10-05 – 2020-10-12 (×8): 220 mg via ORAL
  Filled 2020-10-04 (×8): qty 1

## 2020-10-04 MED ORDER — SODIUM CHLORIDE 0.9 % IV SOLN: INTRAVENOUS | Status: DC

## 2020-10-04 MED ORDER — VANCOMYCIN HCL IN DEXTROSE 1-5 GM/200ML-% IV SOLN
1000.0000 mg | Freq: Two times a day (BID) | INTRAVENOUS | Status: AC
Start: 2020-10-04 — End: 2020-10-05
  Administered 2020-10-04 – 2020-10-05 (×2): 1000 mg via INTRAVENOUS
  Filled 2020-10-04 (×3): qty 200

## 2020-10-04 MED ORDER — HYDRALAZINE HCL 20 MG/ML IJ SOLN: 5.0000 mg | INTRAMUSCULAR | Status: DC | PRN

## 2020-10-04 MED ORDER — APIXABAN 5 MG PO TABS
5.0000 mg | ORAL_TABLET | Freq: Two times a day (BID) | ORAL | Status: DC
  Administered 2020-10-08 – 2020-10-12 (×9): 5 mg via ORAL
  Filled 2020-10-04: qty 1
  Filled 2020-10-04: qty 2
  Filled 2020-10-04 (×6): qty 1
  Filled 2020-10-04: qty 2

## 2020-10-04 MED ORDER — ACETAMINOPHEN 500 MG PO TABS
1000.0000 mg | ORAL_TABLET | Freq: Once | ORAL | Status: AC
  Administered 2020-10-04: 1000 mg via ORAL
  Filled 2020-10-04: qty 2

## 2020-10-04 MED ORDER — LABETALOL HCL 5 MG/ML IV SOLN: 10.0000 mg | INTRAVENOUS | Status: DC | PRN

## 2020-10-04 MED ORDER — SACUBITRIL-VALSARTAN 24-26 MG PO TABS
1.0000 | ORAL_TABLET | Freq: Two times a day (BID) | ORAL | Status: DC
  Administered 2020-10-04 – 2020-10-12 (×16): 1 via ORAL
  Filled 2020-10-04 (×18): qty 1

## 2020-10-04 MED ORDER — MIDAZOLAM HCL 2 MG/2ML IJ SOLN: 1.0000 mg | Freq: Once | INTRAMUSCULAR | Status: AC

## 2020-10-04 MED ORDER — MIDAZOLAM HCL 2 MG/2ML IJ SOLN
INTRAMUSCULAR | Status: AC
  Filled 2020-10-04: qty 2

## 2020-10-04 MED ORDER — ALUM & MAG HYDROXIDE-SIMETH 200-200-20 MG/5ML PO SUSP: 15.0000 mL | ORAL | Status: DC | PRN

## 2020-10-04 MED ORDER — METFORMIN HCL 500 MG PO TABS
500.0000 mg | ORAL_TABLET | Freq: Every day | ORAL | Status: DC
  Administered 2020-10-09 – 2020-10-12 (×4): 500 mg via ORAL
  Filled 2020-10-04 (×4): qty 1

## 2020-10-04 MED ORDER — PHENYLEPHRINE 40 MCG/ML (10ML) SYRINGE FOR IV PUSH (FOR BLOOD PRESSURE SUPPORT)
PREFILLED_SYRINGE | INTRAVENOUS | Status: DC | PRN
  Administered 2020-10-04: 120 ug via INTRAVENOUS
  Administered 2020-10-04 (×2): 160 ug via INTRAVENOUS
  Administered 2020-10-04: 120 ug via INTRAVENOUS

## 2020-10-04 MED ORDER — HYDROMORPHONE HCL 1 MG/ML IJ SOLN
0.5000 mg | INTRAMUSCULAR | Status: DC | PRN
  Administered 2020-10-04 – 2020-10-07 (×10): 1 mg via INTRAVENOUS
  Filled 2020-10-04 (×11): qty 1

## 2020-10-04 MED ORDER — PANTOPRAZOLE SODIUM 40 MG PO TBEC
40.0000 mg | DELAYED_RELEASE_TABLET | Freq: Every day | ORAL | Status: DC
  Administered 2020-10-05 – 2020-10-12 (×8): 40 mg via ORAL
  Filled 2020-10-04 (×9): qty 1

## 2020-10-04 MED ORDER — ORAL CARE MOUTH RINSE: 15.0000 mL | Freq: Once | OROMUCOSAL | Status: AC

## 2020-10-04 MED ORDER — MIDAZOLAM HCL 2 MG/2ML IJ SOLN
INTRAMUSCULAR | Status: AC
  Administered 2020-10-04: 1 mg via INTRAVENOUS
  Filled 2020-10-04: qty 2

## 2020-10-04 MED ORDER — TRANEXAMIC ACID-NACL 1000-0.7 MG/100ML-% IV SOLN
1000.0000 mg | Freq: Once | INTRAVENOUS | Status: AC
  Administered 2020-10-04: 1000 mg via INTRAVENOUS
  Filled 2020-10-04: qty 100

## 2020-10-04 MED ORDER — METOPROLOL TARTRATE 50 MG PO TABS
50.0000 mg | ORAL_TABLET | Freq: Three times a day (TID) | ORAL | Status: DC
  Administered 2020-10-04 – 2020-10-12 (×24): 50 mg via ORAL
  Filled 2020-10-04 (×24): qty 1

## 2020-10-04 MED ORDER — ROPIVACAINE HCL 5 MG/ML IJ SOLN
INTRAMUSCULAR | Status: DC | PRN
  Administered 2020-10-04: 50 mL via PERINEURAL

## 2020-10-04 MED ORDER — PROPOFOL 10 MG/ML IV BOLUS
INTRAVENOUS | Status: DC | PRN
  Administered 2020-10-04: 50 mg via INTRAVENOUS
  Administered 2020-10-04: 150 mg via INTRAVENOUS

## 2020-10-04 MED ORDER — MAGNESIUM CITRATE PO SOLN
1.0000 | Freq: Once | ORAL | Status: DC | PRN
  Filled 2020-10-04: qty 296

## 2020-10-04 MED ORDER — PHENYLEPHRINE 40 MCG/ML (10ML) SYRINGE FOR IV PUSH (FOR BLOOD PRESSURE SUPPORT)
PREFILLED_SYRINGE | INTRAVENOUS | Status: AC
  Filled 2020-10-04: qty 10

## 2020-10-04 MED ORDER — JUVEN PO PACK
1.0000 | PACK | Freq: Two times a day (BID) | ORAL | Status: DC
  Administered 2020-10-05 (×2): 1 via ORAL
  Filled 2020-10-04 (×2): qty 1

## 2020-10-04 MED ORDER — DROPERIDOL 2.5 MG/ML IJ SOLN: 0.6250 mg | Freq: Once | INTRAMUSCULAR | Status: DC | PRN

## 2020-10-04 MED ORDER — CLINDAMYCIN PHOSPHATE 900 MG/50ML IV SOLN
900.0000 mg | INTRAVENOUS | Status: AC
  Administered 2020-10-04: 900 mg via INTRAVENOUS
  Filled 2020-10-04: qty 50

## 2020-10-04 MED ORDER — ASCORBIC ACID 500 MG PO TABS
1000.0000 mg | ORAL_TABLET | Freq: Every day | ORAL | Status: DC
  Administered 2020-10-04 – 2020-10-12 (×9): 1000 mg via ORAL
  Filled 2020-10-04 (×9): qty 2

## 2020-10-04 MED ORDER — FENTANYL CITRATE (PF) 100 MCG/2ML IJ SOLN: 25.0000 ug | INTRAMUSCULAR | Status: DC | PRN

## 2020-10-04 MED ORDER — BISACODYL 5 MG PO TBEC: 5.0000 mg | DELAYED_RELEASE_TABLET | Freq: Every day | ORAL | Status: DC | PRN

## 2020-10-04 SURGICAL SUPPLY — 37 items
BAG COUNTER SPONGE SURGICOUNT (BAG) IMPLANT
BAG SPNG CNTER NS LX DISP (BAG)
BLADE SAW RECIP 87.9 MT (BLADE) ×2 IMPLANT
BLADE SURG 21 STRL SS (BLADE) ×2 IMPLANT
BNDG COHESIVE 6X5 TAN STRL LF (GAUZE/BANDAGES/DRESSINGS) IMPLANT
CANISTER WOUND CARE 500ML ATS (WOUND CARE) ×2 IMPLANT
COVER SURGICAL LIGHT HANDLE (MISCELLANEOUS) ×2 IMPLANT
CUFF TOURN SGL QUICK 34 (TOURNIQUET CUFF) ×2
CUFF TRNQT CYL 34X4.125X (TOURNIQUET CUFF) ×1 IMPLANT
DRAPE INCISE IOBAN 66X45 STRL (DRAPES) ×2 IMPLANT
DRAPE U-SHAPE 47X51 STRL (DRAPES) ×2 IMPLANT
DRESSING PREVENA PLUS CUSTOM (GAUZE/BANDAGES/DRESSINGS) ×1 IMPLANT
DRSG PREVENA PLUS CUSTOM (GAUZE/BANDAGES/DRESSINGS) ×2
DURAPREP 26ML APPLICATOR (WOUND CARE) ×2 IMPLANT
ELECT REM PT RETURN 9FT ADLT (ELECTROSURGICAL) ×2
ELECTRODE REM PT RTRN 9FT ADLT (ELECTROSURGICAL) ×1 IMPLANT
GLOVE SURG ORTHO LTX SZ9 (GLOVE) ×2 IMPLANT
GLOVE SURG UNDER POLY LF SZ9 (GLOVE) ×2 IMPLANT
GOWN STRL REUS W/ TWL XL LVL3 (GOWN DISPOSABLE) ×2 IMPLANT
GOWN STRL REUS W/TWL XL LVL3 (GOWN DISPOSABLE) ×4
KIT BASIN OR (CUSTOM PROCEDURE TRAY) ×2 IMPLANT
KIT TURNOVER KIT B (KITS) ×2 IMPLANT
MANIFOLD NEPTUNE II (INSTRUMENTS) ×2 IMPLANT
NS IRRIG 1000ML POUR BTL (IV SOLUTION) ×2 IMPLANT
PACK ORTHO EXTREMITY (CUSTOM PROCEDURE TRAY) ×2 IMPLANT
PAD ARMBOARD 7.5X6 YLW CONV (MISCELLANEOUS) ×2 IMPLANT
PREVENA RESTOR ARTHOFORM 46X30 (CANNISTER) ×2 IMPLANT
SPONGE T-LAP 18X18 ~~LOC~~+RFID (SPONGE) IMPLANT
STAPLER VISISTAT 35W (STAPLE) IMPLANT
STOCKINETTE IMPERVIOUS LG (DRAPES) ×2 IMPLANT
SUT ETHILON 2 0 PSLX (SUTURE) IMPLANT
SUT SILK 2 0 (SUTURE) ×2
SUT SILK 2-0 18XBRD TIE 12 (SUTURE) ×1 IMPLANT
SUT VIC AB 1 CTX 27 (SUTURE) ×4 IMPLANT
TOWEL GREEN STERILE (TOWEL DISPOSABLE) ×2 IMPLANT
TUBE CONNECTING 12X1/4 (SUCTIONS) ×2 IMPLANT
YANKAUER SUCT BULB TIP NO VENT (SUCTIONS) ×2 IMPLANT

## 2020-10-04 NOTE — Anesthesia Procedure Notes (Addendum)
  Anesthesia Regional Block: Popliteal block   Pre-Anesthetic Checklist: , timeout performed,  Correct Patient, Correct Site, Correct Laterality,  Correct Procedure, Correct Position, site marked,  Risks and benefits discussed,  Surgical consent,  Pre-op evaluation,  At surgeon's request and post-op pain management  Laterality: Left  Prep: chloraprep       Needles:  Injection technique: Single-shot  Needle Type: Echogenic Stimulator Needle     Needle Length: 10cm  Needle Gauge: 20     Additional Needles:   Procedures:,,,, ultrasound used (permanent image in chart),,    Narrative:  Start time: 10/04/2020 10:50 AM End time: 10/04/2020 10:55 AM Injection made incrementally with aspirations every 5 mL.  Performed by: Personally  Anesthesiologist: Merlinda Frederick, MD  Additional Notes: Functioning IV was confirmed and monitors were applied.  Sterile prep and drape,hand hygiene and sterile gloves were used. Ultrasound guidance: relevant anatomy identified, needle position confirmed, local anesthetic spread visualized around nerve(s)., vascular puncture avoided. Negative aspiration and negative test dose prior to incremental administration of local anesthetic. The patient tolerated the procedure well.

## 2020-10-04 NOTE — Progress Notes (Signed)
Orthopedic Tech Progress Note Patient Details:  David Fitzgerald 08/23/1960 [id number]  Called in order to [facility] for a VIVE PROTOCOL BK   Patient ID: David Fitzgerald, male   DOB: 08/20/1960, 59 y.o.   MRN: [id number]  Janit Pagan 10/04/2020, 11:59 AM

## 2020-10-04 NOTE — Anesthesia Postprocedure Evaluation (Signed)
Anesthesia Post Note  Patient: David Fitzgerald  Procedure(s) Performed: LEFT BELOW KNEE AMPUTATION (Left: Knee)     Anesthesia Post Evaluation No notable events documented.  Last Vitals:  Vitals:   10/04/20 1050 10/04/20 1100  BP: (!) 157/71 (!) 142/73  Pulse: 95 70  Resp: 16 16  Temp:    SpO2: 98% 96%    Last Pain:  Vitals:   10/04/20 1008  TempSrc: Oral  PainSc:                  Cathren Harsh

## 2020-10-04 NOTE — Anesthesia Procedure Notes (Signed)
Anesthesia Regional Block: Adductor canal block   Pre-Anesthetic Checklist: , timeout performed,  Correct Patient, Correct Site, Correct Laterality,  Correct Procedure, Correct Position, site marked,  Risks and benefits discussed,  Surgical consent,  Pre-op evaluation,  At surgeon's request and post-op pain management  Laterality: Left  Prep: chloraprep       Needles:  Injection technique: Single-shot  Needle Type: Echogenic Stimulator Needle     Needle Length: 10cm  Needle Gauge: 20     Additional Needles:   Procedures:,,,, ultrasound used (permanent image in chart),,    Narrative:  Start time: 10/04/2020 10:55 AM End time: 10/04/2020 11:00 AM Injection made incrementally with aspirations every 5 mL.  Performed by: Personally  Anesthesiologist: Merlinda Frederick, MD  Additional Notes: Functioning IV was confirmed and monitors were applied.  Sterile prep and drape,hand hygiene and sterile gloves were used. Ultrasound guidance: relevant anatomy identified, needle position confirmed, local anesthetic spread visualized around nerve(s)., vascular puncture avoided. Negative aspiration and negative test dose prior to incremental administration of local anesthetic. The patient tolerated the procedure well.

## 2020-10-04 NOTE — Anesthesia Procedure Notes (Signed)
Procedure Name: LMA Insertion Date/Time: 10/04/2020 11:23 AM Performed by: Cathren Harsh, CRNA Pre-anesthesia Checklist: Patient identified, Emergency Drugs available, Suction available and Patient being monitored Patient Re-evaluated:Patient Re-evaluated prior to induction Oxygen Delivery Method: Circle System Utilized Preoxygenation: Pre-oxygenation with 100% oxygen Induction Type: IV induction Ventilation: Mask ventilation without difficulty LMA: LMA inserted LMA Size: 5.0 Number of attempts: 1 Airway Equipment and Method: Bite block Placement Confirmation: positive ETCO2 Tube secured with: Tape Dental Injury: Teeth and Oropharynx as per pre-operative assessment

## 2020-10-04 NOTE — Transfer of Care (Signed)
Immediate Anesthesia Transfer of Care Note  Patient: [name]  Procedure(s) Performed: LEFT BELOW KNEE AMPUTATION (Left: Knee)  Patient Location: PACU  Anesthesia Type:General  Level of Consciousness: drowsy, patient cooperative and responds to stimulation  Airway & Oxygen Therapy: Patient Spontanous Breathing and Patient connected to face mask oxygen  Post-op Assessment: Report given to RN and Post -op Vital signs reviewed and stable  Post vital signs: Reviewed and stable  Last Vitals:  Vitals Value Taken Time  BP 107/60 10/04/20 1205  Temp    Pulse 116 10/04/20 1209  Resp 15 10/04/20 1209  SpO2 99 % 10/04/20 1209  Vitals shown include unvalidated device data.  Last Pain:  Vitals:   10/04/20 1008  TempSrc: Oral  PainSc:          Complications: No notable events documented.

## 2020-10-04 NOTE — Progress Notes (Signed)
Inpatient Rehab Admissions Coordinator:   Consult received, therapy evaluations pending.  Note that [facility] Medicare typically will not approve a patient for CIR following an amputation.    Shann Medal, PT, DPT Admissions Coordinator [phone] 10/04/20  4:16 PM

## 2020-10-04 NOTE — Op Note (Signed)
   Date of Surgery: 10/04/2020  INDICATIONS: David Fitzgerald is a 60 y.o.-year-old male who is status post tibial calcaneal fusion for complex open pilon fracture.  Patient has developed abscess and osteomyelitis around the ankle.  Despite antibiotics and wound care the cellulitis and infection has worsened and patient presents at this time for transtibial amputation.Marland Kitchen  PREOPERATIVE DIAGNOSIS: Osteomyelitis abscess left ankle status post internal fixation for open Charcot collapse pilon fracture  POSTOPERATIVE DIAGNOSIS: Same.  PROCEDURE: Transtibial amputation Application of Prevena wound VAC  SURGEON: Sharol Given, M.D.  ANESTHESIA:  general  IV FLUIDS AND URINE: See anesthesia records.  ESTIMATED BLOOD LOSS: See anesthesia records.  COMPLICATIONS: None.  DESCRIPTION OF PROCEDURE: The patient was brought to the operating room after undergoing regional anesthetic. After adequate levels of anesthesia were obtained patient's lower extremity was prepped using DuraPrep draped into a sterile field. A timeout was called. The foot was draped out of the sterile field with impervious stockinette. A transverse incision was made 11 cm distal to the tibial tubercle. This curved proximally and a large posterior flap was created. The tibia was transected 1 cm proximal to the skin incision. The fibula was transected just proximal to the tibial incision. The tibia was beveled anteriorly. A large posterior flap was created. The sciatic nerve was pulled cut and allowed to retract. The vascular bundles were suture ligated with 2-0 silk. The deep and superficial fascial layers were closed using #1 Vicryl. The skin was closed using staples and 2-0 nylon. The wound was covered with a Prevena customizable and arthroform wound VAC.  The dressing was sealed with dermatac there was a good suction fit. A prosthetic shrinker and limb protector were applied. Patient was taken to the PACU in stable condition.   DISCHARGE  PLANNING:  Antibiotic duration: 24 hours  Weightbearing: Nonweightbearing on the operative extremity  Pain medication: Opioid pathway  Dressing care/ Wound VAC: Continue wound VAC for 1 week after discharge  Discharge to: Discharge planning based on therapy's recommendations for possible inpatient rehabilitation, outpatient rehabilitation, or discharge to home with therapy  Follow-up: In the office 1 week post operative.  Meridee Score, MD [place] 12:20 PM

## 2020-10-04 NOTE — Progress Notes (Signed)
Received patient from PACU awake,alert/orientedx4 and able to verbalize needs. VSS; NAD noted respirations easy/even on 2L O2 via nasal cannula. Movement/sensation noted to extremities. Nerve block to L stump in place at this time. Education provided on when to call RN for pain meds. Wound vac to LLE on with good seal. SCD on to RLE. IVF infusing. Patient oriented to room and floor. All safety measures in place and personal belongings within reach.

## 2020-10-04 NOTE — H&P (Signed)
David Fitzgerald is an 60 y.o. male.   Chief Complaint: osteomyelitis left ankle HPI: Patient is a 60 year old gentleman who presents in follow-up with an acute episode of cellulitis medial left leg at the area of the proximal interlocking screw from the tibial calcaneal fusion for open Charcot collapse.  Patient states he was doing well until he was most recently hospitalized with A. fib.  Patient has developed ischemic changes to his foot though still has a good dorsalis pedis pulse.  He was started on oral antibiotics cultures were positive for routine skin flora deep within the wound no MRSA.  Despite oral antibiotics the drainage swelling and redness has worsened.  Past Medical History:  Diagnosis Date   A-fib [facility])    Allergy to pollen 04/06/2015   Anxiety 02/06/2017   Arthritis 02/06/2017   Asthma    as a child   Bee sting allergy    wasp / mixed vespid   Bell's palsy 02/24/2013   Chronic venous insufficiency 12/14/2014   Chronic, continuous use of opioids 02/15/2020   Depression    Diabetes ([place])    Dysrhythmia    afib   Edema 12/14/2014   GERD (gastroesophageal reflux disease)    History of kidney stones    Hyperlipidemia, mixed 12/14/2014   Hypertension    IBS (irritable bowel syndrome)    Lumbar pain with radiation down left leg 07/09/2012   Morbid obesity ([place]) 12/14/2014   Morbid obesity with BMI of 45.0-49.9, adult ([place]) 07/09/2012   Obesity hypoventilation syndrome ([place]) 12/14/2014   OSA (obstructive sleep apnea) 03/09/2020   doesn't use a Cpap   OSA treated with BiPAP 12/14/2014   Pain syndrome, chronic 07/09/2012   Pernicious anemia 02/06/2017   Pneumonia    Polyneuropathy in diabetes ([place]) 02/24/2013   Syndrome affecting cervical region 02/24/2013   Trigeminal neuralgia 02/24/2013   Type 2 diabetes mellitus with diabetic neuropathy ([place]) 12/14/2014   Vitamin D deficiency 02/16/2020   Wound dehiscence, traumatic injury repair, left medial ankle  03/08/2020     Past Surgical History:  Procedure Laterality Date    INTERNAL FIXATION LEFT TIBIA TO CALCANEUS AND APPLY SKIN GRAFT (Left Ankle)  04/05/2020   ADENOIDECTOMY     BACK SURGERY  2002 /2003   CARPAL TUNNEL RELEASE Bilateral    I & D EXTREMITY Left 01/31/2020   Procedure: IRRIGATION AND DEBRIDEMENT ANKLE;  Surgeon: Altamese Garner, MD;  Location: [facility];  Service: Orthopedics;  Laterality: Left;   I & D EXTREMITY Left 03/10/2020   Procedure: DEBRIDEMENT LEFT ANKLE, APPLY SKIN GRAFT;  Surgeon: [name], MD;  Location: [facility];  Service: Orthopedics;  Laterality: Left;   KNEE SURGERY Left 2005   ORIF ANKLE FRACTURE Left 01/31/2020   ORIF ANKLE FRACTURE Left 01/31/2020   Procedure: OPEN REDUCTION INTERNAL FIXATION (ORIF) ANKLE FRACTURE;  Surgeon: Altamese Pitts, MD;  Location: [facility];  Service: Orthopedics;  Laterality: Left;   ORIF ANKLE FRACTURE Left 04/05/2020   Procedure: INTERNAL FIXATION LEFT TIBIA TO CALCANEUS AND APPLY SKIN GRAFT;  Surgeon: [name], MD;  Location: [facility];  Service: Orthopedics;  Laterality: Left;   SHOULDER SURGERY Left 1998   TONSILLECTOMY      Family History  Problem Relation Age of Onset   Breast cancer Mother    Heart disease Father    CAD Father    Atrial fibrillation Father    Stomach cancer Paternal Aunt    Heart disease Paternal Uncle  Breast cancer Maternal Grandmother    Social History:  reports that he quit smoking about 14 years ago. His smoking use included cigarettes. He has a 105.00 pack-year smoking history. He has never used smokeless tobacco. He reports that he does not drink alcohol and does not use drugs.  Allergies:  Allergies  Allergen Reactions   Bee Venom Anaphylaxis and Swelling   Penicillins Swelling and Other (See Comments)    CEPHALOSPORIN TOLERANT 01/31/2020- "Allergic," per paperwork from facility   Aspirin Other (See Comments)    Relative contraindication due to hematochezia while on it- "Allergic," per paperwork from  facility   Fentanyl     Heart rate went up and down, BP went up and down  Made him jittery.    No medications prior to admission.    No results found for this or any previous visit (from the past 48 hour(s)). No results found.  Review of Systems  All other systems reviewed and are negative.  There were no vitals taken for this visit. Physical Exam  Patient is alert, oriented, no adenopathy, well-dressed, normal affect, normal respiratory effort. Examination patient still has a foot that is cool to the touch previous capillary refill was greater than 10 seconds.  Capillary refill today is approximately 5 seconds.  He has a strongly palpable dorsalis pedis pulse.  The area of cellulitis has increased after starting antibiotics he has weeping edema from his leg with clear serous drainage.  The cultures showed routine skin flora deep within the wound against the hardware.  Patient complains of increased pain.  His previous traumatic wound has healed well with no signs of breakdown or infection.heart RRR Lungs Clear  Plan: With the worsening cellulitis and drainage and increased pain discussed with the patient that his best option is to proceed with a transtibial amputation.  Patient will hold on the Eliquis with anticipated surgery on Wednesday.  Risks and benefits were discussed including wound breakdown need for additional surgery.  Patient and family state they understand and wish to proceed at this time. Assessment/Plan   David Palmer Lerone Onder, PA 10/04/2020, 5:55 AM

## 2020-10-04 NOTE — Interval H&P Note (Signed)
History and Physical Interval Note:  10/04/2020 11:19 AM  David Fitzgerald  has presented today for surgery, with the diagnosis of Infected Left Ankle.  The various methods of treatment have been discussed with the patient and family. After consideration of risks, benefits and other options for treatment, the patient has consented to  Procedure(s): LEFT BELOW KNEE AMPUTATION (Left) as a surgical intervention.  The patient's history has been reviewed, patient examined, no change in status, stable for surgery.  I have reviewed the patient's chart and labs.  Questions were answered to the patient's satisfaction.     [name]

## 2020-10-04 NOTE — Progress Notes (Signed)
Pacu Nursing Note  Ortho tech for Dr Jess Barters office was here to fit sock and other items needed to attach prostetic later.   He will talk to Dr Sharol Given about recommendations.

## 2020-10-04 NOTE — Anesthesia Preprocedure Evaluation (Addendum)
Anesthesia Evaluation  Patient identified by MRN, date of birth, ID band Patient awake    Reviewed: Allergy & Precautions, H&P , NPO status , Patient's Chart, lab work & pertinent test results  Airway Mallampati: III  TM Distance: >3 FB Neck ROM: Full    Dental  (+) Edentulous Upper, Edentulous Lower   Pulmonary asthma , sleep apnea (noncompliant w/ bipap) and Continuous Positive Airway Pressure Ventilation , former smoker,  Quit smoking 2008, 105 pack year history    Pulmonary exam normal breath sounds clear to auscultation       Cardiovascular hypertension, Pt. on medications and Pt. on home beta blockers + CAD  Normal cardiovascular exam+ dysrhythmias (last eliquis this AM) Atrial Fibrillation  Rhythm:Regular Rate:Normal  08/18/20 ECHO 1. Atrial fibrillation   Left ventricular ejection fraction, by estimation, is 40 to 45%. The  left ventricle has mild to moderately decreased function. Left ventricular  endocardial border not optimally defined to evaluate regional wall motion.  There is moderate concentric  left ventricular hypertrophy. Left ventricular diastolic parameters are  indeterminate.  2. Right ventricular systolic function is normal. The right ventricular  size is normal. There is normal pulmonary artery systolic pressure.  3. The mitral valve is normal in structure. No evidence of mitral valve  regurgitation. No evidence of mitral stenosis.  4. The aortic valve was not well visualized. Aortic valve regurgitation  is not visualized. No aortic stenosis is present.    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)    GI/Hepatic Neg liver ROS, GERD  Medicated and Controlled,  Endo/Other  diabetes, Well Controlled, Type 2, Oral Hypoglycemic AgentsMorbid obesityBMI 4   Renal/GU negative Renal ROS  negative genitourinary   Musculoskeletal  (+)  Arthritis , Osteoarthritis,   chronic pain    Abdominal   Peds negative pediatric ROS (+)  Hematology negative hematology ROS (+) anemia ,   Anesthesia Other Findings   Reproductive/Obstetrics negative OB ROS                                                             Anesthesia Evaluation  Patient identified by MRN, date of birth, ID band Patient awake    Reviewed: Allergy & Precautions, NPO status , Patient's Chart, lab work & pertinent test results, reviewed documented beta blocker date and time   History of Anesthesia Complications Negative for: history of anesthetic complications  Airway Mallampati: II  TM Distance: >3 FB Neck ROM: Full    Dental  (+) Edentulous Upper, Edentulous Lower   Pulmonary sleep apnea and Continuous Positive Airway Pressure Ventilation , former smoker,    Pulmonary exam normal        Cardiovascular hypertension, Pt. on medications and Pt. on home beta blockers + CAD  Normal cardiovascular exam+ dysrhythmias Atrial Fibrillation      Neuro/Psych Anxiety negative neurological ROS     GI/Hepatic negative GI ROS, Neg liver ROS,   Endo/Other  diabetes, Type 2Morbid obesity  Renal/GU negative Renal ROS  negative genitourinary   Musculoskeletal  (+) Arthritis ,   Abdominal   Peds  Hematology  (+) anemia ,   Anesthesia Other Findings   Reproductive/Obstetrics  Anesthesia Physical Anesthesia Plan  ASA: III  Anesthesia Plan: General   Post-op Pain Management:    Induction: Intravenous  PONV Risk Score and Plan: 2 and Ondansetron, Dexamethasone, Midazolam and Treatment may vary due to age or medical condition  Airway Management Planned: LMA  Additional Equipment: None  Intra-op Plan:   Post-operative Plan: Extubation in OR  Informed Consent: I have reviewed the patients History and Physical, chart, labs and discussed the procedure  including the risks, benefits and alternatives for the proposed anesthesia with the patient or authorized representative who has indicated his/her understanding and acceptance.     Dental advisory given  Plan Discussed with:   Anesthesia Plan Comments:         Anesthesia Quick Evaluation  Anesthesia Physical  Anesthesia Plan  ASA: 4  Anesthesia Plan: General   Post-op Pain Management: GA combined w/ Regional for post-op pain   Induction: Intravenous  PONV Risk Score and Plan: 2 and Ondansetron, Dexamethasone, Midazolam and Treatment may vary due to age or medical condition  Airway Management Planned: LMA  Additional Equipment: None  Intra-op Plan:   Post-operative Plan: Extubation in OR  Informed Consent: I have reviewed the patients History and Physical, chart, labs and discussed the procedure including the risks, benefits and alternatives for the proposed anesthesia with the patient or authorized representative who has indicated his/her understanding and acceptance.     Dental advisory given  Plan Discussed with: CRNA, Anesthesiologist and Surgeon  Anesthesia Plan Comments:        Anesthesia Quick Evaluation

## 2020-10-04 NOTE — Progress Notes (Signed)
OT Cancellation Note  Patient Details Name: David Fitzgerald MRN: [id number] DOB: [birth year]   Cancelled Treatment:    Reason Eval/Treat Not Completed: Patient at procedure or test/ unavailable (OR) for transtibial amputation. Will follow up later date.  Janathan Bribiesca,HILLARY 10/04/2020, 1:22 PM Maurie Boettcher, OT/L   Acute OT Clinical Specialist Acute Rehabilitation Services Pager [phone] Office [phone]

## 2020-10-05 ENCOUNTER — Encounter (HOSPITAL_COMMUNITY): Payer: Self-pay | Admitting: Orthopedic Surgery

## 2020-10-05 LAB — CBC
HCT: 38.7 % — ABNORMAL LOW (ref 39.0–52.0)
Hemoglobin: 11.4 g/dL — ABNORMAL LOW (ref 13.0–17.0)
MCH: 25.3 pg — ABNORMAL LOW (ref 26.0–34.0)
MCHC: 29.5 g/dL — ABNORMAL LOW (ref 30.0–36.0)
MCV: 86 fL (ref 80.0–100.0)
Platelets: 280 10*3/uL (ref 150–400)
RBC: 4.5 MIL/uL (ref 4.22–5.81)
RDW: 18.7 % — ABNORMAL HIGH (ref 11.5–15.5)
WBC: 10.6 10*3/uL — ABNORMAL HIGH (ref 4.0–10.5)
nRBC: 0 % (ref 0.0–0.2)

## 2020-10-05 LAB — BASIC METABOLIC PANEL
Anion gap: 13 (ref 5–15)
BUN: 11 mg/dL (ref 6–20)
CO2: 25 mmol/L (ref 22–32)
Calcium: 8.3 mg/dL — ABNORMAL LOW (ref 8.9–10.3)
Chloride: 98 mmol/L (ref 98–111)
Creatinine, Ser: 1 mg/dL (ref 0.61–1.24)
GFR, Estimated: >60 mL/min (ref >60)
Glucose, Bld: 117 mg/dL — ABNORMAL HIGH (ref 70–99)
Potassium: 4.2 mmol/L (ref 3.5–5.1)
Sodium: 136 mmol/L (ref 135–145)

## 2020-10-05 MED ORDER — GLUCERNA SHAKE PO LIQD
237.0000 mL | Freq: Two times a day (BID) | ORAL | Status: DC
  Administered 2020-10-06 – 2020-10-12 (×11): 237 mL via ORAL

## 2020-10-05 MED ORDER — ENSURE MAX PROTEIN PO LIQD
11.0000 [oz_av] | Freq: Every day | ORAL | Status: DC
  Administered 2020-10-05 – 2020-10-11 (×7): 11 [oz_av] via ORAL

## 2020-10-05 MED ORDER — ADULT MULTIVITAMIN W/MINERALS CH
1.0000 | ORAL_TABLET | Freq: Every day | ORAL | Status: DC
  Administered 2020-10-05 – 2020-10-12 (×8): 1 via ORAL
  Filled 2020-10-05 (×8): qty 1

## 2020-10-05 NOTE — Evaluation (Signed)
Physical Therapy Evaluation Patient Details Name: David Fitzgerald MRN: [id number] DOB: 1960/07/31 Today's Date: 10/05/2020   History of Present Illness  60 y.o. male presenting to [facility] on 8/31. Patient with mechanical fall 01/2020 with resulting open L ankle fx s/p ORIF.  Presented with hardware failure s/p repair 04/2020 with skin graft. Now has acute episode of cellulitis medial left leg at the area of the proximal interlocking screw from the tibial calcaneal fusion for open Charcot collapse.  Patient states he was doing well until he was most recently hospitalized with A. fib.  Patient has developed ischemic changes to his foot though still has a good dorsalis pedis pulse.  He was started on oral antibiotics cultures were positive for routine skin flora deep within the wound no MRSA.  Despite oral antibiotics the drainage swelling and redness has worsened.  Received BK amputation on LLE 8/31, now referred to PT for mobility.  CIR has consulted.  PMHx:  Bells palsy, anxiety, a-fib, chronic venous insufficiency, depression, DM, Edema, HTN, IBS, back pain with LLE radiation, morbid obesity, hypoventilation syndrome, PNA, OSA, pernicious anemia, L ankle wound dehiscence, trigeminal neuralgia, PN,  Clinical Impression  Pt was seen for initial gait and transfers on side of bed, but declined to get to chair due to his time up earlier.  He is surprisingly good with mobility, mainly struggles with coming to stand from lower heights due to being taller and weaker now.  Will recommend CIR if possible and insurance will allow.  Pt is a good rehab candidate due to being mobile prior to surgery and having good understanding of the process to get home again.  Pt has some family support, and will have good follow up help to increase acceptance likelihood for CIR.  See for acute PT goals.  HR range during therapy was 102-145, sats down briefly to 74% sitting but during standing and walking above 90%.      Follow Up  Recommendations CIR    Equipment Recommendations  None recommended by PT    Recommendations for Other Services Rehab consult     Precautions / Restrictions Precautions Precautions: Fall Precaution Comments: L BKA; monitor HR Required Braces or Orthoses: Other Brace Other Brace: L limb protector, has wound vac Restrictions Weight Bearing Restrictions: Yes LLE Weight Bearing: Non weight bearing      Mobility  Bed Mobility Overal bed mobility: Needs Assistance Bed Mobility: Supine to Sit;Sit to Supine     Supine to sit: Min assist Sit to supine: Min assist   General bed mobility comments: pt is almost able to handle himself    Transfers Overall transfer level: Needs assistance Equipment used: Rolling walker (2 wheeled) Transfers: Sit to/from [facility] Sit to Stand: Mod assist Stand pivot transfers: Min assist       General transfer comment: mod from bed mult tries, but fatigued him from sidestepping on side of bed  Ambulation/Gait Ambulation/Gait assistance: Min assist Gait Distance (Feet): 8 Feet (4 x 2) Assistive device: Rolling walker (2 wheeled);1 person hand held assist   Gait velocity: reduced   General Gait Details: hopped on side of bed and sat between bouts  Stairs Stairs:  (no steps at home)          Wheelchair Mobility    Modified Rankin (Stroke Patients Only)       Balance Overall balance assessment: Needs assistance Sitting-balance support: Single extremity supported;Bilateral upper extremity supported Sitting balance-[name]: Good     Standing balance  support: Bilateral upper extremity supported;During functional activity Standing balance-[name]: Poor Standing balance comment: Reliant on BUE on RW with static/dynamic balance.                             Pertinent Vitals/Pain Pain Assessment: 0-10 Pain Score: 8  Pain Location: reports pain in LLE Pain Descriptors / Indicators:  Aching;Sore;Operative site guarding Pain Intervention(s): Limited activity within patient's tolerance;Premedicated before session;Repositioned    Home Living Family/patient expects to be discharged to:: Private residence Living Arrangements: Alone Available Help at Discharge: Family;Available 24 hours/day Type of Home: House Home Access: Ramped entrance     Home Layout: One level Home Equipment: Walker - 2 wheels;Walker - 4 wheels;Hand held shower head;Bedside commode;Wheelchair - manual;Cane - quad;Cane - single point;Tub bench Additional Comments: wheelchair and BSC from local church.    Prior Function Level of Independence: Needs assistance   Gait / Transfers Assistance Needed: RW with cam boot within pain tolerance vs wheelchair just prior to admission. Prior to fall and L open ankle fx 12/21, patient I with household/community mobility without AD.  ADL's / Homemaking Assistance Needed: Mod I with ADLs including bathing at shower level with use of DME/AD. Family assists with IADLs including cooking/cleaning and transportation.  Comments: pt was in severe pain on LLE prior to surgery and not able to walk much     Hand Dominance   Dominant Hand: Right    Extremity/Trunk Assessment   Upper Extremity Assessment Upper Extremity Assessment: Defer to OT evaluation    Lower Extremity Assessment Lower Extremity Assessment: LLE deficits/detail LLE Deficits / Details: new L BK amputation in limb protector LLE Coordination: decreased gross motor    Cervical / Trunk Assessment Cervical / Trunk Assessment: Other exceptions Cervical / Trunk Exceptions: body habitus  Communication   Communication: No difficulties  Cognition Arousal/Alertness: Awake/alert Behavior During Therapy: WFL for tasks assessed/performed Overall Cognitive Status: Within Functional Limits for tasks assessed                                        General Comments General comments (skin  integrity, edema, etc.): HR was in a-fib from 102 to 145 during session, not impacted up by gait    Exercises     Assessment/Plan    PT Assessment Patient needs continued PT services  PT Problem List Decreased strength;Decreased activity tolerance;Decreased balance;Decreased mobility;Decreased coordination;Decreased knowledge of use of DME;Obesity;Decreased skin integrity;Pain       PT Treatment Interventions DME instruction;Gait training;Functional mobility training;Therapeutic activities;Therapeutic exercise;Balance training;Neuromuscular re-education;Patient/family education    PT Goals (Current goals can be found in the Care Plan section)  Acute Rehab PT Goals Patient Stated Goal: Independence PT Goal Formulation: With patient/family Time For Goal Achievement: 10/19/20 Potential to Achieve Goals: Good    Frequency Min 4X/week   Barriers to discharge Inaccessible home environment;Decreased caregiver support home with ramp with pt living alone, family nearby    Co-evaluation               AM-PAC PT "6 Clicks" Mobility  Outcome Measure Help needed turning from your back to your side while in a flat bed without using bedrails?: A Little Help needed moving from lying on your back to sitting on the side of a flat bed without using bedrails?: A Little Help needed moving to and from a bed  to a chair (including a wheelchair)?: A Little Help needed standing up from a chair using your arms (e.g., wheelchair or bedside chair)?: A Little Help needed to walk in hospital room?: A Lot Help needed climbing 3-5 steps with a railing? : Total 6 Click Score: 15    End of Session Equipment Utilized During Treatment: Gait belt;Oxygen Activity Tolerance: Patient limited by fatigue;Patient limited by pain Patient left: in bed;with call bell/phone within reach;with bed alarm set;with family/visitor present Nurse Communication: Mobility status PT Visit Diagnosis: Unsteadiness on feet  (R26.81);Muscle weakness (generalized) (M62.81);Pain Pain - Right/Left: Left Pain - part of body: Leg    Time: [id number] PT Time Calculation (min) (ACUTE ONLY): 24 min   Charges:   PT Evaluation $PT Eval Moderate Complexity: 1 Mod PT Treatments $Gait Training: 8-22 mins       Ramond Dial 10/05/2020, 11:40 AM  Mee Hives, PT MS Acute Rehab Dept. Number: [place] and [employer]

## 2020-10-05 NOTE — Progress Notes (Signed)
Patient ID: David Fitzgerald, male   DOB: 1960-05-08, 60 y.o.   MRN: [id number] Patient is postoperative day 1 transtibial amputation the wound VAC was not plugged in.  The plug was in the wound VAC bag in the closet.  The wound VAC was restarted this had a good suction fit.  Discharge planning based on therapy recommendations.

## 2020-10-05 NOTE — Progress Notes (Signed)
Inpatient Rehab Admissions Coordinator:   PT/OT recommending CIR; however, [employer] Medicare will not approve CIR for diagnosis of amputation.  Will need to pursue other rehab venues.    Shann Medal, PT, DPT Admissions Coordinator [phone] 10/05/20  3:22 PM

## 2020-10-05 NOTE — Progress Notes (Signed)
Initial Nutrition Assessment  DOCUMENTATION CODES:   Morbid obesity  INTERVENTION:   -D/c Juven -Glucerna Shake po BID, each supplement provides 220 kcal and 10 grams of protein  -Ensure Max po daily, each supplement provides 150 kcal and 30 grams of protein.   -MVI with minerals daily  -Magic cup TID with meals, each supplement provides 290 kcal and 9 grams of protein   NUTRITION DIAGNOSIS:   Increased nutrient needs related to post-op healing as evidenced by estimated needs.  GOAL:   Patient will meet greater than or equal to 90% of their needs  MONITOR:   PO intake, Supplement acceptance, Labs, Weight trends, Skin, I & O's  REASON FOR ASSESSMENT:   Other (Comment)    ASSESSMENT:   Patient is a 60 year old gentleman who presents in follow-up with an acute episode of cellulitis medial left leg at the area of the proximal interlocking screw from the tibial calcaneal fusion for open Charcot collapse.  Patient states he was doing well until he was most recently hospitalized with A. fib.  Patient has developed ischemic changes to his foot though still has a good dorsalis pedis pulse.  He was started on oral antibiotics cultures were positive for routine skin flora deep within the wound no MRSA.  Despite oral antibiotics the drainage swelling and redness has worsened.  Pt admitted with cellulitis of lt medial lt leg.    8/31- s/p PROCEDURE: Lt Transtibial amputation Application of Prevena wound VAC  Pt working with physical therapy at time of visit. Unable to speak with pt or complete nutrition-focused physical exam at this time.   Noted meal completion 75%.   Reviewed wt hx; wt has been stable over the past 6 months.   Pt with increased nutritional needs for post-operative healing and would benefit from addition of oral nutrition supplements.   Obesity is a complex, chronic medical condition that is optimally managed by a multidisciplinary care team. Weight loss is not an  ideal goal for an acute inpatient hospitalization. However, if further work-up for obesity is warranted, consider outpatient referral to outpatient bariatric service and/or [facility].    Medications reviewed and include vitamin C, colace, lasix, vitamin B-12, and zinc sulfate.   Labs reviewed: CBGS: 122 (inpatient orders for glycemic control are 500 mg metformin daily).    Diet Order:   Diet Order             Diet Carb Modified Fluid consistency: Thin; Room service appropriate? Yes  Diet effective now                   EDUCATION NEEDS:   Education needs have been addressed  Skin:  Skin Assessment: Skin Integrity Issues: Skin Integrity Issues:: Wound VAC Wound Vac: s/p lt BKA  Last BM:  10/03/20  Height:   Ht Readings from Last 1 Encounters:  10/04/20 '6\' 2"'$  (1.88 m)    Weight:   Wt Readings from Last 1 Encounters:  10/04/20 (!) 172.4 kg    Ideal Body Weight:  80.8 kg  BMI:  Body mass index is 48.79 kg/m.  Estimated Nutritional Needs:   Kcal:  2400-2600  Protein:  140-160 grams  Fluid:  > 2 L    Loistine Chance, RD, LDN, [place] Registered Dietitian II Certified Diabetes Care and Education Specialist Please refer to [facility] for RD and/or RD on-call/weekend/after hours pager

## 2020-10-05 NOTE — Evaluation (Signed)
Occupational Therapy Evaluation Patient Details Name: David Fitzgerald MRN: [id number] DOB: April 20, 1960 Today's Date: 10/05/2020    History of Present Illness 60 y.o. male presenting to [facility] on 8/31. Patient with mechanical fall 01/2020 with resulting open L ankle fx s/p ORIF.  Presented with hardware failure s/p repair 04/2020 with skin graft. 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 Sharol Given. PMHx significant for A. fib on Eliquis, DMII w/ neuropathy, venous insufficiency, morbid obesity and BPH.   Clinical Impression   PTA patient was living alone in a private residence with ramped entry and was grossly Mod I with ADLs with use of AD/DME. Patient reports assist from family with IADLs including meal prep, housekeeping and transportation. Patient currently functioning below baseline demonstrating observed ADLs including LB dressing and toilet transfers with Min to Max A grossly. Patient also limited by deficits listed below including generalized weakness, decreased balance, decreased cardiopulmonary endurance, and decreased activity tolerance and would benefit from continued acute OT services in prep for safe d/c to next level of care. Recommendation for CIR given patient PLOF and current deficits. OT will continue to follow acutely.      Follow Up Recommendations  CIR    Equipment Recommendations  Other (comment) (Patient has necessary DME)    Recommendations for Other Services Rehab consult     Precautions / Restrictions Precautions Precautions: Fall Precaution Comments: L BKA; monitor HR Required Braces or Orthoses:  (L limb guard) Restrictions Weight Bearing Restrictions: Yes LLE Weight Bearing: Non weight bearing      Mobility Bed Mobility               General bed mobility comments: Seated EOB upon entry.    Transfers Overall transfer level: Needs assistance Equipment used: Rolling walker (2 wheeled) Transfers: Sit  to/from [facility] Sit to Stand: Mod assist Stand pivot transfers: Min assist       General transfer comment: Heavy Min A for sit to stand from elevated EOB and Mod A from recliner (bumped up with pillows). Min A for stand-pivot to R with use of RW. Mild anterior LOB requiring Min A to correct.    Balance Overall balance assessment: Needs assistance Sitting-balance support: Single extremity supported;No upper extremity supported;Feet supported Sitting balance-[name]: Good     Standing balance support: Bilateral upper extremity supported;During functional activity Standing balance-[name]: Poor Standing balance comment: Reliant on BUE on RW with static/dynamic balance.                           ADL either performed or assessed with clinical judgement   ADL Overall ADL's : Needs assistance/impaired Eating/Feeding: Set up   Grooming: Set up;Sitting           Upper Body Dressing : Set up;Sitting       Toilet Transfer: Moderate assistance;RW Toilet Transfer Details (indicate cue type and reason): Simulated with transfer to recliner with use of RW.                 Vision Baseline Vision/History: 1 Wears glasses Ability to See in Adequate Light: 0 Adequate Patient Visual Report: No change from baseline Vision Assessment?: No apparent visual deficits     Perception     Praxis      Pertinent Vitals/Pain Pain Assessment: 0-10 Pain Score: 8  Pain Location: L residual limb (denies phantom limb pain/sensation) Pain Descriptors / Indicators:  Aching;Sore;Operative site guarding Pain Intervention(s): Limited activity within patient's tolerance;Monitored during session;Repositioned     Hand Dominance Right   Extremity/Trunk Assessment Upper Extremity Assessment Upper Extremity Assessment: Overall WFL for tasks assessed   Lower Extremity Assessment Lower Extremity Assessment: Defer to PT evaluation   Cervical / Trunk  Assessment Cervical / Trunk Assessment: Other exceptions Cervical / Trunk Exceptions: Large body habitus   Communication Communication Communication: No difficulties   Cognition Arousal/Alertness: Awake/alert Behavior During Therapy: WFL for tasks assessed/performed Overall Cognitive Status: Within Functional Limits for tasks assessed                                     General Comments  HR with A-fib with rates 118-150 (max HR) with mild activity/transfers. SpO2 >93% on 3L O2.    Exercises     Shoulder Instructions      Home Living Family/patient expects to be discharged to:: Private residence Living Arrangements: Alone Available Help at Discharge: Family;Available 24 hours/day (Mother and grandson live next door.) Type of Home: House Home Access: Ramped entrance     [place]: One level     Bathroom Shower/Tub: B and E unit;Curtain   Biochemist, clinical: Standard Bathroom Accessibility: Yes   Home Equipment: Environmental consultant - 2 wheels;Walker - 4 wheels;Hand held shower head;Bedside commode;Wheelchair - manual;Cane - quad;Cane - single point;Tub bench          Prior Functioning/Environment Level of Independence: Needs assistance  Gait / Transfers Assistance Needed: RW with cam boot within pain tolerance vs wheelchair just prior to admission. Prior to fall and L open ankle fx 12/21, patient I with household/community mobility without AD. ADL's / Homemaking Assistance Needed: Mod I with ADLs including bathing at shower level with use of DME/AD. Family assists with IADLs including cooking/cleaning and transportation.            OT Problem List: Decreased strength;Decreased activity tolerance;Impaired balance (sitting and/or standing);Decreased knowledge of use of DME or AE;Cardiopulmonary status limiting activity;Obesity      OT Treatment/Interventions: Self-care/ADL training;Therapeutic exercise;Energy conservation;DME and/or AE instruction;Therapeutic  activities;Patient/family education;Balance training    OT Goals(Current goals can be found in the care plan section) Acute Rehab OT Goals Patient Stated Goal: Independence OT Goal Formulation: With patient Time For Goal Achievement: 10/19/20 Potential to Achieve Goals: Good ADL Goals Pt Will Perform Upper Body Bathing: with modified independence;sitting Pt Will Perform Lower Body Bathing: with modified independence;sitting/lateral leans Pt Will Perform Upper Body Dressing: with modified independence;sitting Pt Will Perform Lower Body Dressing: with modified independence;sitting/lateral leans;with adaptive equipment Pt Will Transfer to Toilet: with modified independence;bedside commode;stand pivot transfer Pt Will Perform Toileting - Clothing Manipulation and hygiene: with modified independence;sitting/lateral leans Pt Will Perform Tub/Shower Transfer: Tub transfer;tub bench;Squat pivot transfer;Stand pivot transfer  OT Frequency: Min 3X/week   Barriers to D/C:            Co-evaluation              AM-PAC OT "6 Clicks" Daily Activity     Outcome Measure Help from another person eating meals?: None Help from another person taking care of personal grooming?: A Little Help from another person toileting, which includes using toliet, bedpan, or urinal?: A Lot Help from another person bathing (including washing, rinsing, drying)?: A Lot Help from another person to put on and taking off regular upper body clothing?: A Little Help from another person to put on  and taking off regular lower body clothing?: A Lot 6 Click Score: 16   End of Session Equipment Utilized During Treatment: Gait belt;Rolling walker;Oxygen Nurse Communication: Mobility status;Other (comment) (Elevated HR)  Activity Tolerance: Patient tolerated treatment well Patient left: in chair;with call bell/phone within reach;with chair alarm set  OT Visit Diagnosis: Unsteadiness on feet (R26.81);Muscle weakness  (generalized) (M62.81)                Time: [id number] OT Time Calculation (min): 26 min Charges:  OT General Charges $OT Visit: 1 Visit OT Evaluation $OT Eval Moderate Complexity: 1 Mod OT Treatments $Therapeutic Activity: 8-22 mins  Devron Cohick H. OTR/L Supplemental OT, Department of rehab services [phone]  Azel Gumina R H. 10/05/2020, 9:09 AM

## 2020-10-06 LAB — BASIC METABOLIC PANEL
Anion gap: 7 (ref 5–15)
BUN: 17 mg/dL (ref 6–20)
CO2: 35 mmol/L — ABNORMAL HIGH (ref 22–32)
Calcium: 9.3 mg/dL (ref 8.9–10.3)
Chloride: 93 mmol/L — ABNORMAL LOW (ref 98–111)
Creatinine, Ser: 1.23 mg/dL (ref 0.61–1.24)
GFR, Estimated: >60 mL/min (ref >60)
Glucose, Bld: 134 mg/dL — ABNORMAL HIGH (ref 70–99)
Potassium: 3.9 mmol/L (ref 3.5–5.1)
Sodium: 135 mmol/L (ref 135–145)

## 2020-10-06 LAB — CBC
HCT: 38.4 % — ABNORMAL LOW (ref 39.0–52.0)
Hemoglobin: 11.2 g/dL — ABNORMAL LOW (ref 13.0–17.0)
MCH: 25.3 pg — ABNORMAL LOW (ref 26.0–34.0)
MCHC: 29.2 g/dL — ABNORMAL LOW (ref 30.0–36.0)
MCV: 86.7 fL (ref 80.0–100.0)
Platelets: 331 10*3/uL (ref 150–400)
RBC: 4.43 MIL/uL (ref 4.22–5.81)
RDW: 19 % — ABNORMAL HIGH (ref 11.5–15.5)
WBC: 11.1 10*3/uL — ABNORMAL HIGH (ref 4.0–10.5)
nRBC: 0 % (ref 0.0–0.2)

## 2020-10-06 LAB — SARS CORONAVIRUS 2 (TAT 6-24 HRS): SARS Coronavirus 2: NEGATIVE

## 2020-10-06 MED ORDER — OXYCODONE HCL 10 MG PO TABS: 10.0000 mg | ORAL_TABLET | ORAL | 0 refills | Status: DC | PRN

## 2020-10-06 MED ORDER — GLUCERNA SHAKE PO LIQD: 237.0000 mL | Freq: Two times a day (BID) | ORAL | 0 refills | Status: DC

## 2020-10-06 MED ORDER — ZINC SULFATE 220 (50 ZN) MG PO CAPS: 220.0000 mg | ORAL_CAPSULE | Freq: Every day | ORAL | Status: DC

## 2020-10-06 NOTE — NC FL2 (Signed)
[place] LEVEL OF CARE SCREENING TOOL     IDENTIFICATION  Patient Name: David Fitzgerald Birthdate: 17-Jan-1961 Sex: male Admission Date (Current Location): 10/04/2020  [facility] and [place] Number:  Herbalist and Address:  [facility], [place] [address] [place], [place] [address]      Provider Number: [id number]  Attending Physician Name and Address:  [name], MD  Relative Name and Phone Number:  Molly, Armbrecht (Mother)   [phone]    Current Level of Care: Hospital Recommended Level of Care: [place] Prior Approval Number:    Date Approved/Denied:   PASRR Number: [id number]  Discharge Plan: SNF    Current Diagnoses: Patient Active Problem List   Diagnosis Date Noted   Abscess of bursa of left ankle 10/04/2020   Subacute osteomyelitis, left ankle and foot ([place])    Abscess of ankle    Asthma 07/31/2020   Depression 07/31/2020   Dysrhythmia 07/31/2020   GERD (gastroesophageal reflux disease) 07/31/2020   History of kidney stones 07/31/2020   Pneumonia 07/31/2020   Left ankle pain 04/05/2020   Open dislocation of left ankle    A-fib (HCC)    OSA (obstructive sleep apnea) 03/09/2020   Wound dehiscence, traumatic injury repair, left medial ankle  03/08/2020   Vitamin D deficiency 02/16/2020   Chronic, continuous use of opioids 02/15/2020   Open fracture dislocation of left ankle 01/31/2020   Ankle fracture 01/31/2020   Permanent atrial fibrillation ([place])    Hypertension    Diabetes ([place])    Bee sting allergy    Hypotension 12/25/2017   Chronic anticoagulation 03/07/2017   Coronary artery calcification seen on CT scan 02/07/2017   CAD in native artery 02/07/2017   Persistent atrial fibrillation ([place]) 02/06/2017   Anxiety 02/06/2017   Arthritis 02/06/2017   Pernicious anemia 02/06/2017   Allergy to pollen 04/06/2015   OSA treated with BiPAP 12/14/2014   Obesity hypoventilation syndrome  ([place]) 12/14/2014   Morbid obesity with BMI of 40.0-44.9, adult ([place]) 12/14/2014   Hypertensive heart disease 12/14/2014   Hyperlipidemia, mixed 12/14/2014   Type 2 diabetes mellitus with diabetic neuropathy ([place]) 12/14/2014   Chronic venous insufficiency 12/14/2014   Edema 12/14/2014   Morbid obesity ([place]) 12/14/2014   Bell's palsy 02/24/2013   Polyneuropathy in diabetes ([place]) 02/24/2013   Syndrome affecting cervical region 02/24/2013   Trigeminal neuralgia 02/24/2013   Lumbar pain with radiation down left leg 07/09/2012   BMI 45.0-49.9, adult ([place]) 07/09/2012   Chronic pain syndrome 07/09/2012   Morbid obesity with BMI of 45.0-49.9, adult ([place]) 07/09/2012   Pain syndrome, chronic 07/09/2012    Orientation RESPIRATION BLADDER Height & Weight     Self, Time, Situation, Place  Normal Continent Weight: (!) 380 lb (172.4 kg) Height:  '6\' 2"'$  (188 cm)  BEHAVIORAL SYMPTOMS/MOOD NEUROLOGICAL BOWEL NUTRITION STATUS      Continent Diet (see d/c summary)  AMBULATORY STATUS COMMUNICATION OF NEEDS Skin   Limited Assist Verbally Surgical wounds                       Personal Care Assistance Level of Assistance  Bathing, Dressing, Feeding Bathing Assistance: Limited assistance Feeding assistance: Independent Dressing Assistance: Limited assistance     Functional Limitations Info  Sight, Hearing, Speech Sight Info: Adequate Hearing Info: Adequate Speech Info: Adequate    SPECIAL CARE FACTORS FREQUENCY  OT (By licensed OT), PT (By licensed PT)  PT Frequency: 5x/ week OT Frequency: 5x/ week            Contractures Contractures Info: Not present    Additional Factors Info  Code Status, Allergies Code Status Info: Full Allergies Info: Bee Venom   Penicillins   Aspirin   Fentanyl           Current Medications (10/06/2020):  This is the current hospital active medication list Current Facility-Administered Medications  Medication Dose Route Frequency Provider Last  Rate Last Admin   0.9 %  sodium chloride infusion   Intravenous Continuous Persons, Bevely Palmer, [place] 75 mL/hr at 10/04/20 1319 New Bag at 10/04/20 1319   acetaminophen (TYLENOL) tablet 325-650 mg  325-650 mg Oral Q6H PRN Persons, Bevely Palmer, PA       alum & mag hydroxide-simeth (MAALOX/MYLANTA) 200-200-20 MG/5ML suspension 15-30 mL  15-30 mL Oral Q2H PRN Persons, Bevely Palmer, [place]       [START ON 10/08/2020] apixaban (ELIQUIS) tablet 5 mg  5 mg Oral BID Persons, Bevely Palmer, [place]       ascorbic acid (VITAMIN C) tablet 1,000 mg  1,000 mg Oral Daily Persons, Bevely Palmer, PA   1,000 mg at 10/06/20 0830   bisacodyl (DULCOLAX) EC tablet 5 mg  5 mg Oral Daily PRN Persons, Bevely Palmer, PA       dicyclomine (BENTYL) tablet 20 mg  20 mg Oral QHS Persons, Bevely Palmer, [place]   20 mg at 10/05/20 2046   docusate sodium (COLACE) capsule 100 mg  100 mg Oral Daily Persons, Bevely Palmer, PA   100 mg at 10/06/20 [id number]   DULoxetine (CYMBALTA) DR capsule 60 mg  60 mg Oral Daily Persons, Bevely Palmer, PA   60 mg at 10/06/20 0829   feeding supplement (GLUCERNA SHAKE) (GLUCERNA SHAKE) liquid 237 mL  237 mL Oral BID BM [name], MD   237 mL at 10/06/20 0836   fentaNYL (SUBLIMAZE) injection 25 mcg  25 mcg Intravenous Once Merlinda Frederick, MD       furosemide (LASIX) tablet 40 mg  40 mg Oral Daily Persons, Bevely Palmer, PA   40 mg at 10/06/20 [id number]   gabapentin (NEURONTIN) tablet 600 mg  600 mg Oral TID Persons, Bevely Palmer, PA   600 mg at 10/06/20 [id number]   guaiFENesin-dextromethorphan (ROBITUSSIN DM) 100-10 MG/5ML syrup 15 mL  15 mL Oral Q4H PRN Persons, Bevely Palmer, PA       hydrALAZINE (APRESOLINE) injection 5 mg  5 mg Intravenous Q20 Min PRN Persons, Bevely Palmer, PA       HYDROmorphone (DILAUDID) injection 0.5-1 mg  0.5-1 mg Intravenous Q4H PRN Persons, Bevely Palmer, PA   1 mg at 10/06/20 0835   labetalol (NORMODYNE) injection 10 mg  10 mg Intravenous Q10 min PRN Persons, Bevely Palmer, [place]       [START ON 10/09/2020] metFORMIN (GLUCOPHAGE) tablet 500 mg  500 mg  Oral Q breakfast Persons, Bevely Palmer, PA       metoprolol tartrate (LOPRESSOR) injection 2-5 mg  2-5 mg Intravenous Q2H PRN Persons, Bevely Palmer, PA   3 mg at 10/05/20 1152   metoprolol tartrate (LOPRESSOR) tablet 50 mg  50 mg Oral TID Persons, Bevely Palmer, PA   50 mg at 10/06/20 [id number]   multivitamin with minerals tablet 1 tablet  1 tablet Oral Daily [name], MD   1 tablet at 10/06/20 0830   nitroGLYCERIN (NITROSTAT) SL tablet 0.4 mg  0.4 mg Sublingual Q5 min PRN  Persons, Bevely Palmer, PA       ondansetron [facility]) injection 4 mg  4 mg Intravenous Q6H PRN Persons, Bevely Palmer, [place]       oxyCODONE (Oxy IR/ROXICODONE) immediate release tablet 10-15 mg  10-15 mg Oral Q4H PRN Persons, Bevely Palmer, PA   15 mg at 10/06/20 [id number]   oxyCODONE (Oxy IR/ROXICODONE) immediate release tablet 5-10 mg  5-10 mg Oral Q4H PRN Persons, Bevely Palmer, PA       pantoprazole (PROTONIX) EC tablet 40 mg  40 mg Oral Daily Persons, Bevely Palmer, PA   40 mg at 10/06/20 0829   phenol (CHLORASEPTIC) mouth spray 1 spray  1 spray Mouth/Throat PRN Persons, Bevely Palmer, PA       polyethylene glycol (MIRALAX / GLYCOLAX) packet 17 g  17 g Oral Daily PRN Persons, Bevely Palmer, PA       protein supplement (ENSURE MAX) liquid  11 oz Oral QHS [name], MD   11 oz at 10/05/20 2047   sacubitril-valsartan (ENTRESTO) 24-26 mg per tablet  1 tablet Oral BID Persons, Bevely Palmer, [place]   1 tablet at 10/06/20 [id number]   simvastatin (ZOCOR) tablet 20 mg  20 mg Oral QHS Persons, Bevely Palmer, PA   20 mg at 10/05/20 2050   vitamin B-12 (CYANOCOBALAMIN) tablet 1,000 mcg  1,000 mcg Oral Daily Persons, Bevely Palmer, PA   1,000 mcg at 10/06/20 0830   zinc sulfate capsule 220 mg  220 mg Oral Daily Persons, Bevely Palmer, PA   220 mg at 10/06/20 [id number]     Discharge Medications: Please see discharge summary for a list of discharge medications.  Relevant Imaging Results:  Relevant Lab Results:   Additional Information SS# [id number]; COVID vaccination X 3  Yeny Schmoll F Beckett Maden,  LCSWA

## 2020-10-06 NOTE — Plan of Care (Signed)

## 2020-10-06 NOTE — Care Management Important Message (Signed)
Important Message  Patient Details  Name: CORBIT NIEBEL MRN: [id number] Date of Birth: [birth year]   Medicare Important Message Given:  Yes     David Fitzgerald 10/06/2020, 3:13 PM

## 2020-10-06 NOTE — Progress Notes (Signed)
Physical Therapy Treatment Patient Details Name: David Fitzgerald MRN: [id number] DOB: [birth year] Today's Date: 10/06/2020    History of Present Illness 60 y.o. male presenting to [facility] on 8/31. Patient with mechanical fall 01/2020 with resulting open L ankle fx s/p ORIF.  Presented with hardware failure s/p repair 04/2020 with skin graft. Now has acute episode of cellulitis medial left leg at the area of the proximal interlocking screw from the tibial calcaneal fusion for open Charcot collapse.  Patient states he was doing well until he was most recently hospitalized with A. fib.  Patient has developed ischemic changes to his foot though still has a good dorsalis pedis pulse.  He was started on oral antibiotics cultures were positive for routine skin flora deep within the wound no MRSA.  Despite oral antibiotics the drainage swelling and redness has worsened.  Received BK amputation on LLE 8/31, now referred to PT for mobility.  CIR has consulted.  PMHx:  Bells palsy, anxiety, a-fib, chronic venous insufficiency, depression, DM, Edema, HTN, IBS, back pain with LLE radiation, morbid obesity, hypoventilation syndrome, PNA, OSA, pernicious anemia, L ankle wound dehiscence, trigeminal neuralgia, PN,    PT Comments    Pt was seen for abbreviated session due to his inability to tolerate much movement after the effort to get to chair and sit up.  Instructed bed ex's to him to perform prior to getting up and transferring. Pt is demonstrating understanding and will follow up to be sure he is doing them correctly next session.  Follow for acute PT goals, progress gait.   Follow Up Recommendations  SNF     Equipment Recommendations  None recommended by PT    Recommendations for Other Services       Precautions / Restrictions Precautions Precautions: Fall Precaution Comments: L BKA; monitor HR Required Braces or Orthoses: Other Brace Other Brace: L limb protector, has wound vac Restrictions Weight  Bearing Restrictions: Yes LLE Weight Bearing: Non weight bearing    Mobility  Bed Mobility               General bed mobility comments: up in chair    Transfers                 General transfer comment: declines  Ambulation/Gait                 Stairs             Wheelchair Mobility    Modified Rankin (Stroke Patients Only)       Balance     Sitting balance-[name]: Good                                      Cognition Arousal/Alertness: Awake/alert Behavior During Therapy: WFL for tasks assessed/performed Overall Cognitive Status: Within Functional Limits for tasks assessed                                        Exercises General Exercises - Lower Extremity Ankle Circles/Pumps: AROM;Right;5 reps Quad Sets: AROM;10 reps Gluteal Sets: AROM;10 reps Heel Slides: AROM;AAROM;10 reps Hip ABduction/ADduction: AAROM;AROM;10 reps    General Comments General comments (skin integrity, edema, etc.): wearing limb protector but in chair declinging gait due to pain and tightness of LE      Pertinent Vitals/Pain  Pain Assessment: Faces Faces Pain Scale: Hurts little more Pain Location: reports pain in LLE Pain Descriptors / Indicators: Cramping;Tightness Pain Intervention(s): Monitored during session;Premedicated before session;Repositioned    Home Living                      Prior Function            PT Goals (current goals can now be found in the care plan section) Acute Rehab PT Goals Patient Stated Goal: Independence    Frequency    Min 4X/week      PT Plan Discharge plan needs to be updated    Co-evaluation              AM-PAC PT "6 Clicks" Mobility   Outcome Measure  Help needed turning from your back to your side while in a flat bed without using bedrails?: A Little Help needed moving from lying on your back to sitting on the side of a flat bed without using bedrails?: A  Little Help needed moving to and from a bed to a chair (including a wheelchair)?: A Little Help needed standing up from a chair using your arms (e.g., wheelchair or bedside chair)?: A Little Help needed to walk in hospital room?: A Little Help needed climbing 3-5 steps with a railing? : Total 6 Click Score: 16    End of Session Equipment Utilized During Treatment: Oxygen Activity Tolerance: Patient limited by fatigue;Patient limited by pain Patient left: in chair;with chair alarm set;with call bell/phone within reach Nurse Communication: Mobility status PT Visit Diagnosis: Unsteadiness on feet (R26.81);Muscle weakness (generalized) (M62.81);Pain Pain - Right/Left: Left Pain - part of body: Leg     Time: [id number] PT Time Calculation (min) (ACUTE ONLY): 19 min  Charges:  $Therapeutic Exercise: 8-22 mins              Ramond Dial 10/06/2020, 5:10 PM  Mee Hives, PT MS Acute Rehab Dept. Number: [place] and [place]

## 2020-10-06 NOTE — TOC Initial Note (Signed)
Transition of Care [facility]) - Initial/Assessment Note    Patient Details  Name: David Fitzgerald MRN: [id number] Date of Birth: 09-12-1960  Transition of Care [facility]) CM/SW Contact:    Milinda Antis, [place] Phone Number: 10/06/2020, 10:49 AM  Clinical Narrative:                 CSW received consult for possible SNF placement at time of discharge. CSW spoke with patient and his mother. The family expressed understanding of PT recommendation and is agreeable to SNF placement at time of discharge. Patient reports preference for [facility] or [facility]. CSW discussed insurance authorization process and will provide Medicare SNF ratings list. Patient has received 3 COVID vaccines.  No further questions reported at this time.   Skilled Nursing Rehab Facilities-   [email or web address] Ratings out of 5 possible    Name Address  Phone # [place] [facility] [place]  [facility] [address] [place] '5 1 4 4  '$ [facility]  [address], [place] [phone] '3 1 5 4  '$ [facility] [place], [place] '3 1 1 1  '$ [facility] [place], [place] '2 2 4 4  '$ [facility] [address], [place] '3 1 2 1  '$ [facility] [address] [place] '3 2 4 4  '$ [facility] [address] [place] '5 1 2 2  '$ [facility] [address] [place] [phone] '5 2 2 3  '$ [facility] '5 1 2 2  '$ [facility] [phone] [address], [place] [phone] '5 1 2 2  '$ [facility] [address], [facility] [phone] '5 1 2 2  '$ [facility] [address] [place] [phone] '3 1 1 1     '$ Expected Discharge Plan: Skilled Nursing Facility Barriers to Discharge: Insurance Authorization, SNF Pending bed  offer   Patient Goals and CMS Choice Patient states their goals for this hospitalization and ongoing recovery are:: To get home CMS Medicare.gov Compare Post Acute Care list provided to:: Patient Choice offered to / list presented to : Patient, Parent  Expected Discharge Plan and Services Expected Discharge Plan: [place]   Discharge Planning Services: CM Consult   Living arrangements for the past 2 months: Single Family Home Expected Discharge Date: 10/06/20                                    Prior Living Arrangements/Services Living arrangements for the past 2 months: Single Family Home Lives with:: Self Patient language and need for interpreter reviewed:: Yes Do you feel safe going back to the place where you live?: Yes      Need for Family Participation in Patient Care: Yes (Comment) Care giver support system in place?: Yes (comment)   Criminal Activity/Legal Involvement Pertinent to Current Situation/Hospitalization: No - Comment as needed  Activities of Daily Living Home Assistive Devices/Equipment: Dentures (specify type), Eyeglasses ADL Screening (condition at time of admission) Patient's cognitive ability adequate to safely complete daily activities?: Yes Is the patient deaf or have difficulty hearing?: No Does the patient have difficulty seeing, even when wearing glasses/contacts?: No Does the patient have difficulty concentrating, remembering, or making decisions?: No Patient able to express need for assistance with ADLs?: Yes Does the patient have difficulty dressing or  bathing?: No Independently performs ADLs?: Yes (appropriate for developmental age) Does the patient have difficulty walking or climbing stairs?: Yes Weakness of Legs: None Weakness of Arms/Hands: None  Permission Sought/Granted   Permission granted to share information with : Yes, Verbal Permission Granted  Share Information with NAME: David Fitzgerald (Mother)  [phone]  Permission granted to share info w AGENCY: SNF        Emotional Assessment Appearance:: Appears stated age Attitude/Demeanor/Rapport: Engaged Affect (typically observed): Accepting Orientation: : Oriented to Self, Oriented to Place, Oriented to  Time, Oriented to Situation Alcohol / Substance Use: Not Applicable Psych Involvement: No (comment)  Admission diagnosis:  Abscess of bursa of left ankle [M71.072] Patient Active Problem List   Diagnosis Date Noted   Abscess of bursa of left ankle 10/04/2020   Subacute osteomyelitis, left ankle and foot ([place])    Abscess of ankle    Asthma 07/31/2020   Depression 07/31/2020   Dysrhythmia 07/31/2020   GERD (gastroesophageal reflux disease) 07/31/2020   History of kidney stones 07/31/2020   Pneumonia 07/31/2020   Left ankle pain 04/05/2020   Open dislocation of left ankle    A-fib (HCC)    OSA (obstructive sleep apnea) 03/09/2020   Wound dehiscence, traumatic injury repair, left medial ankle  03/08/2020   Vitamin D deficiency 02/16/2020   Chronic, continuous use of opioids 02/15/2020   Open fracture dislocation of left ankle 01/31/2020   Ankle fracture 01/31/2020   Permanent atrial fibrillation ([place])    Hypertension    Diabetes ([place])    Bee sting allergy    Hypotension 12/25/2017   Chronic anticoagulation 03/07/2017   Coronary artery calcification seen on CT scan 02/07/2017   CAD in native artery 02/07/2017   Persistent atrial fibrillation ([place]) 02/06/2017   Anxiety 02/06/2017   Arthritis 02/06/2017   Pernicious anemia 02/06/2017   Allergy to pollen 04/06/2015   OSA treated with BiPAP 12/14/2014   Obesity hypoventilation syndrome ([place]) 12/14/2014   Morbid obesity with BMI of 40.0-44.9, adult ([place]) 12/14/2014   Hypertensive heart disease 12/14/2014   Hyperlipidemia, mixed 12/14/2014   Type 2 diabetes mellitus with diabetic neuropathy ([place]) 12/14/2014   Chronic venous insufficiency 12/14/2014   Edema 12/14/2014    Morbid obesity ([place]) 12/14/2014   Bell's palsy 02/24/2013   Polyneuropathy in diabetes ([place]) 02/24/2013   Syndrome affecting cervical region 02/24/2013   Trigeminal neuralgia 02/24/2013   Lumbar pain with radiation down left leg 07/09/2012   BMI 45.0-49.9, adult ([place]) 07/09/2012   Chronic pain syndrome 07/09/2012   Morbid obesity with BMI of 45.0-49.9, adult ([place]) 07/09/2012   Pain syndrome, chronic 07/09/2012   PCP:  Angelina Sheriff, MD Pharmacy:   [facility] [place], [place] - [address] [place] [place] [address] Phone: [phone] Fax: [phone]     Social Determinants of Health (SDOH) Interventions    Readmission Risk Interventions Readmission Risk Prevention Plan 02/02/2020  Post Dischage Appt Complete  Medication Screening Complete  Transportation Screening Complete  Some recent data might be hidden

## 2020-10-06 NOTE — Discharge Summary (Signed)
Discharge Diagnoses:  Active Problems:   Subacute osteomyelitis, left ankle and foot (HCC)   Abscess of ankle   Abscess of bursa of left ankle   Surgeries: Procedure(s): LEFT BELOW KNEE AMPUTATION on 10/04/2020    Consultants:   Discharged Condition: Improved  Hospital Course: David Fitzgerald is an 60 y.o. male who was admitted 10/04/2020 with a chief complaint of left ankle osteomyelitis, with a final diagnosis of Infected Left Ankle.  Patient was brought to the operating room on 10/04/2020 and underwent Procedure(s): LEFT BELOW KNEE AMPUTATION.    Patient was given perioperative antibiotics:  Anti-infectives (From admission, onward)    Start     Dose/Rate Route Frequency Ordered Stop   10/04/20 1315  vancomycin (VANCOCIN) IVPB 1000 mg/200 mL premix        1,000 mg 200 mL/hr over 60 Minutes Intravenous Every 12 hours 10/04/20 1307 10/05/20 0256   10/04/20 1000  clindamycin (CLEOCIN) IVPB 900 mg        900 mg 100 mL/hr over 30 Minutes Intravenous On call to O.R. 10/04/20 6812 10/04/20 1126     .  Patient was given sequential compression devices, early ambulation, and aspirin for DVT prophylaxis.  Recent vital signs: Patient Vitals for the past 24 hrs:  BP Temp Temp src Pulse Resp SpO2  10/06/20 0338 106/70 98.9 F (37.2 C) Oral 92 18 91 %  10/05/20 2300 106/70 98.2 F (36.8 C) Oral 78 18 92 %  10/05/20 2250 -- -- -- -- -- (!) 88 %  10/05/20 1954 115/68 98 F (36.7 C) -- 63 20 91 %  10/05/20 1954 115/68 98.9 F (37.2 C) Oral 91 20 92 %  10/05/20 1155 -- -- -- (!) 108 -- --  10/05/20 1142 -- -- -- (!) 108 -- --  10/05/20 1141 106/69 99.1 F (37.3 C) Oral (!) 139 18 92 %  10/05/20 0752 (!) 134/94 98.2 F (36.8 C) Oral (!) 103 19 94 %  .  Recent laboratory studies: No results found.  Discharge Medications:   Allergies as of 10/06/2020       Reactions   Bee Venom Anaphylaxis, Swelling   Penicillins Swelling, Other (See Comments)   CEPHALOSPORIN TOLERANT 01/31/2020-  "Allergic," per paperwork from facility   Aspirin Other (See Comments)   Relative contraindication due to hematochezia while on it- "Allergic," per paperwork from facility   Fentanyl    Heart rate went up and down, BP went up and down Made him jittery.        Medication List     STOP taking these medications    dapagliflozin propanediol 10 MG Tabs tablet Commonly known as: Farxiga   doxycycline 100 MG tablet Commonly known as: VIBRA-TABS   oxyCODONE-acetaminophen 7.5-325 MG tablet Commonly known as: PERCOCET       TAKE these medications    ascorbic acid 1000 MG tablet Commonly known as: VITAMIN C Take 1,000 mg by mouth daily.   cyanocobalamin 1000 MCG tablet Take 1 tablet (1,000 mcg total) by mouth daily. What changed: how much to take   diazepam 5 MG tablet Commonly known as: VALIUM Take 5 mg by mouth every 6 (six) hours as needed for anxiety.   dicyclomine 20 MG tablet Commonly known as: BENTYL Take 20 mg by mouth at bedtime.   DULoxetine 60 MG capsule Commonly known as: CYMBALTA Take 60 mg by mouth daily.   Eliquis 5 MG Tabs tablet Generic drug: apixaban TAKE 1 TABLET BY MOUTH TWICE DAILY  EPINEPHrine 0.3 mg/0.3 mL Soaj injection Commonly known as: EPI-PEN Inject 0.3 mg into the muscle as needed for anaphylaxis.   feeding supplement (GLUCERNA SHAKE) Liqd Take 237 mLs by mouth 2 (two) times daily between meals.   furosemide 40 MG tablet Commonly known as: LASIX Take 1 tablet (40 mg total) by mouth daily. What changed:  when to take this reasons to take this   gabapentin 600 MG tablet Commonly known as: NEURONTIN Take 600 mg by mouth 3 (three) times daily.   metFORMIN 500 MG tablet Commonly known as: GLUCOPHAGE Take 500 mg by mouth daily with breakfast.   metoprolol tartrate 50 MG tablet Commonly known as: LOPRESSOR Take 50 mg by mouth 3 (three) times daily.   naloxone 4 MG/0.1ML Liqd nasal spray kit Commonly known as: NARCAN Place 1  spray into the nose See admin instructions. OPIOID OVERDOSE EMERGENCY   naproxen sodium 220 MG tablet Commonly known as: ALEVE Take 440 mg by mouth daily as needed (for headaches).   nitroGLYCERIN 0.4 MG SL tablet Commonly known as: NITROSTAT Place 1 tablet (0.4 mg total) under the tongue every 5 (five) minutes as needed for chest pain.   Oxycodone HCl 10 MG Tabs Take 1-1.5 tablets (10-15 mg total) by mouth every 4 (four) hours as needed for severe pain (pain score 7-10).   pantoprazole 40 MG tablet Commonly known as: PROTONIX Take 40 mg by mouth daily.   sacubitril-valsartan 24-26 MG Commonly known as: ENTRESTO Take 1 tablet by mouth 2 (two) times daily.   simvastatin 20 MG tablet Commonly known as: ZOCOR Take 20 mg by mouth at bedtime.   Turmeric 500 MG Caps Take 500 mg by mouth daily.   Vitamin D3 125 MCG (5000 UT) Tabs Take 5,000 Units by mouth daily.   zinc sulfate 220 (50 Zn) MG capsule Take 1 capsule (220 mg total) by mouth daily.        Diagnostic Studies: XR Ankle 2 Views Left  Result Date: 09/28/2020 2 view radiographs of the left ankle shows progressive lytic changes at the proximal interlocking screw there is no hardware failure no loss of reduction.  The fusion alignment is stable.   Patient benefited maximally from their hospital stay and there were no complications.     Disposition: Discharge disposition: 03-Skilled Nursing Facility      Discharge Instructions     Call MD / Call 911   Complete by: As directed    If you experience chest pain or shortness of breath, CALL 911 and be transported to the hospital emergency room.  If you develope a fever above 101 F, pus (white drainage) or increased drainage or redness at the wound, or calf pain, call your surgeon's office.   Constipation Prevention   Complete by: As directed    Drink plenty of fluids.  Prune juice may be helpful.  You may use a stool softener, such as Colace (over the counter) 100  mg twice a day.  Use MiraLax (over the counter) for constipation as needed.   Diet - low sodium heart healthy   Complete by: As directed    Discharge instructions   Complete by: As directed    Call ofice if vac alarms   Increase activity slowly as tolerated   Complete by: As directed    Negative Pressure Wound Therapy - Incisional   Complete by: As directed    Show patient how to attach preveena vac   Post-operative opioid taper instructions:   Complete  by: As directed    POST-OPERATIVE OPIOID TAPER INSTRUCTIONS: It is important to wean off of your opioid medication as soon as possible. If you do not need pain medication after your surgery it is ok to stop day one. Opioids include: Codeine, Hydrocodone(Norco, Vicodin), Oxycodone(Percocet, oxycontin) and hydromorphone amongst others.  Long term and even short term use of opiods can cause: Increased pain response Dependence Constipation Depression Respiratory depression And more.  Withdrawal symptoms can include Flu like symptoms Nausea, vomiting And more Techniques to manage these symptoms Hydrate well Eat regular healthy meals Stay active Use relaxation techniques(deep breathing, meditating, yoga) Do Not substitute Alcohol to help with tapering If you have been on opioids for less than two weeks and do not have pain than it is ok to stop all together.  Plan to wean off of opioids This plan should start within one week post op of your joint replacement. Maintain the same interval or time between taking each dose and first decrease the dose.  Cut the total daily intake of opioids by one tablet each day Next start to increase the time between doses. The last dose that should be eliminated is the evening dose.          Follow-up Information     Suzan Slick, NP Follow up in 1 week(s).   Specialty: Orthopedic Surgery Contact information: [address] [place] [address] [phone]                   Signed: Bevely Palmer Persons 10/06/2020, 7:27 AM

## 2020-10-06 NOTE — Progress Notes (Signed)
PT Cancellation Note  Patient Details Name: David Fitzgerald MRN: [id number] DOB: 1960-08-03   Cancelled Treatment:    Reason Eval/Treat Not Completed: Other (comment).  Pt is having a meal, retry at another time.   Ramond Dial 10/06/2020, 12:20 PM  Mee Hives, PT MS Acute Rehab Dept. Number: [place]

## 2020-10-06 NOTE — Progress Notes (Signed)
Patient is postop day 2 status post below-knee amputation.  He has Above very comfortable appropriate to exam.  Wound VAC functioning with 2 green checks 0 cc in the canister.  Patient would not be able to get CIR.  Spoke with him at length.  He is willing to entertain skilled nursing would prefer [facility] in [place] order has been placed may discharge when bed available patient understands that may have to consider other nursing facilities

## 2020-10-07 NOTE — TOC Progression Note (Signed)
Transition of Care [facility]) - Progression Note    Patient Details  Name: David Fitzgerald MRN: [id number] Date of Birth: Jun 09, 1960  Transition of Care [facility]) CM/SW [place], [place] Phone Number: 10/07/2020, 10:37 AM  Clinical Narrative:    Pt does not have bed offers.  TOC will continue to assist with disposition planning.   Expected Discharge Plan: Skilled Nursing Facility Barriers to Discharge: Insurance Authorization, SNF Pending bed offer  Expected Discharge Plan and Services Expected Discharge Plan: [place]   Discharge Planning Services: CM Consult   Living arrangements for the past 2 months: Single Family Home Expected Discharge Date: 10/06/20                                     Social Determinants of Health (SDOH) Interventions    Readmission Risk Interventions Readmission Risk Prevention Plan 02/02/2020  Post Dischage Appt Complete  Medication Screening Complete  Transportation Screening Complete  Some recent data might be hidden

## 2020-10-07 NOTE — Progress Notes (Signed)
Patient ID: David Fitzgerald, male   DOB: 12-11-1960, 60 y.o.   MRN: [id number] Patient will need discharge to skilled nursing.  [facility]  Will plan for discharge when skilled nursing is authorized.  Patient has no complaints this morning he is sitting up on the side of the bed the wound VAC has a good suction fit with no drainage.

## 2020-10-08 NOTE — Plan of Care (Signed)
  Problem: Activity: Goal: Risk for activity intolerance will decrease Outcome: Progressing   Problem: Pain Managment: Goal: General experience of comfort will improve Outcome: Progressing   Problem: Safety: Goal: Ability to remain free from injury will improve Outcome: Progressing   

## 2020-10-08 NOTE — Progress Notes (Signed)
Patient ID: David Fitzgerald, male   DOB: 1960-05-05, 60 y.o.   MRN: [id number] Sleeping, comfortably, no dyspnea, left leg with BKA and orthosis for immediate post op knee extension and training for prosthesis. VAC is drawing negative pressure without difficulty. No drainage.  Awaiting SNF placement. Orthopaedicly stable.

## 2020-10-09 LAB — SURGICAL PATHOLOGY

## 2020-10-09 LAB — GLUCOSE, CAPILLARY: Glucose-Capillary: 121 mg/dL — ABNORMAL HIGH (ref 70–99)

## 2020-10-09 NOTE — TOC Progression Note (Signed)
Transition of Care [facility]) - Progression Note    Patient Details  Name: David Fitzgerald MRN: [id number] Date of Birth: Dec 03, 1960  Transition of Care [facility]) CM/SW Contact  Milinda Antis, [place] Phone Number: 10/09/2020, 10:27 AM  Clinical Narrative:    CSW reviewed the chart.  No facility has accepted the patient.  10:30-CSW contacted [facility] in [place] to inquire about whether they can accept the patient.  The admissions director is reviewing the referral.    Pending:  bed offer and insurance auth.    Expected Discharge Plan: Skilled Nursing Facility Barriers to Discharge: Insurance Authorization, SNF Pending bed offer  Expected Discharge Plan and Services Expected Discharge Plan: [place]   Discharge Planning Services: CM Consult   Living arrangements for the past 2 months: Single Family Home Expected Discharge Date: 10/06/20                                     Social Determinants of Health (SDOH) Interventions    Readmission Risk Interventions Readmission Risk Prevention Plan 02/02/2020  Post Dischage Appt Complete  Medication Screening Complete  Transportation Screening Complete  Some recent data might be hidden

## 2020-10-09 NOTE — Progress Notes (Signed)
Patient ID: David Fitzgerald, male   DOB: 12-12-1960, 60 y.o.   MRN: [id number] There is been no acute changes over the past 24 hours.  He is awake and alert.  There is an incisional VAC on the end of his left BKA stump that has a good seal.  Short-term skilled nursing placement is pending.

## 2020-10-09 NOTE — Progress Notes (Signed)
Physical Therapy Treatment Patient Details Name: David Fitzgerald MRN: [id number] DOB: 10/13/1960 Today's Date: 10/09/2020    History of Present Illness 60 y.o. male presenting to [facility] on 8/31. Patient with mechanical fall 01/2020 with resulting open L ankle fx s/p ORIF.  Presented with hardware failure s/p repair 04/2020 with skin graft. Now has acute episode of cellulitis medial left leg at the area of the proximal interlocking screw from the tibial calcaneal fusion for open Charcot collapse.  Patient has developed ischemic changes to his foot despite oral antibiotics the drainage swelling and redness has worsened.  Underwent BKA on LLE 8/31, referred to PT for mobility.   PMHx:  Bells palsy, anxiety, a-fib, chronic venous insufficiency, depression, DM, Edema, HTN, IBS, back pain with LLE radiation, morbid obesity, hypoventilation syndrome, PNA, OSA, pernicious anemia, L ankle wound dehiscence, trigeminal neuralgia, PN,    PT Comments    Pt admitted with above diagnosis. Pt was able to pivot to recliner with RW with min assist and cues for technique.  Pt cannot tolerate standing long on single extremity therefore agree with plan for SNF.  Did decr frequency to appropriate frequency of 3x week given that pt is going to [facility].  Will continue PT.   Pt currently with functional limitations due to balance and endurance deficits. Pt will benefit from skilled PT to increase their independence and safety with mobility to allow discharge to the venue listed below.      Follow Up Recommendations  SNF     Equipment Recommendations  Other (comment) (Pt states he needs a 27 inch wide wheelchair for his doors at home but unsure if a smaller wheelchiar will work for his habitus)    Recommendations for Other Services       Precautions / Restrictions Precautions Precautions: Fall Precaution Comments: L BKA; monitor HR Required Braces or Orthoses: Other Brace Other Brace: L limb protector, has wound  vac Restrictions Weight Bearing Restrictions: Yes LLE Weight Bearing: Non weight bearing    Mobility  Bed Mobility Overal bed mobility: Needs Assistance Bed Mobility: Supine to Sit     Supine to sit: Min assist     General bed mobility comments: A little assist to come to eOB but very minimal assist needed    Transfers Overall transfer level: Needs assistance Equipment used: Rolling walker (2 wheeled) Transfers: Sit to/from [facility] Sit to Stand: Mod assist;Min assist Stand pivot transfers: Min assist       General transfer comment: Pt needed mod assist to power up from lower bed but min from raised bed.  Can only stand for about 1 min and then needs to sit prior to pivoting to chair. Pt was able to pivot to chair with min assist with RW.  Ambulation/Gait                 Stairs             Wheelchair Mobility    Modified Rankin (Stroke Patients Only)       Balance Overall balance assessment: Needs assistance Sitting-balance support: Single extremity supported;Bilateral upper extremity supported Sitting balance-[name]: Good     Standing balance support: Bilateral upper extremity supported;During functional activity Standing balance-[name]: Poor Standing balance comment: Reliant on BUE on RW with static/dynamic balance.                            Cognition Arousal/Alertness: Awake/alert Behavior During Therapy:  WFL for tasks assessed/performed Overall Cognitive Status: Within Functional Limits for tasks assessed                                        Exercises General Exercises - Lower Extremity Ankle Circles/Pumps: AROM;Right;5 reps Quad Sets: AROM;10 reps;Seated;Both Gluteal Sets: AROM;10 reps;Both;Seated Heel Slides: AROM;AAROM;10 reps;Seated Hip ABduction/ADduction: AAROM;AROM;10 reps;Seated    General Comments General comments (skin integrity, edema, etc.): Wearing limb  protector, VSS      Pertinent Vitals/Pain Pain Assessment: Faces Faces Pain Scale: Hurts little more Pain Location: reports pain in LLE Pain Descriptors / Indicators: Cramping;Tightness Pain Intervention(s): Limited activity within patient's tolerance;Monitored during session;Repositioned    Home Living                      Prior Function            PT Goals (current goals can now be found in the care plan section) Progress towards PT goals: Progressing toward goals    Frequency    Min 3X/week      PT Plan Current plan remains appropriate;Frequency needs to be updated    Co-evaluation              AM-PAC PT "6 Clicks" Mobility   Outcome Measure  Help needed turning from your back to your side while in a flat bed without using bedrails?: None Help needed moving from lying on your back to sitting on the side of a flat bed without using bedrails?: A Little Help needed moving to and from a bed to a chair (including a wheelchair)?: A Little Help needed standing up from a chair using your arms (e.g., wheelchair or bedside chair)?: A Lot Help needed to walk in hospital room?: A Lot Help needed climbing 3-5 steps with a railing? : Total 6 Click Score: 15    End of Session Equipment Utilized During Treatment: Gait belt Activity Tolerance: Patient limited by fatigue;Patient limited by pain Patient left: in chair;with chair alarm set;with call bell/phone within reach;with family/visitor present Nurse Communication: Mobility status PT Visit Diagnosis: Unsteadiness on feet (R26.81);Muscle weakness (generalized) (M62.81);Pain Pain - Right/Left: Left Pain - part of body: Leg     Time: [id number] PT Time Calculation (min) (ACUTE ONLY): 34 min  Charges:  $Gait Training: 8-22 mins $Therapeutic Exercise: 8-22 mins                     Chrystina Naff M,PT Acute Rehab Services [phone] (pager)    Alvira Philips 10/09/2020, 12:32 PM

## 2020-10-10 NOTE — TOC Progression Note (Addendum)
Transition of Care [facility]) - Progression Note    Patient Details  Name: ABDULLATIF TURRENTINE MRN: [id number] Date of Birth: [birth year]  Transition of [facility]) CM/SW Contact  Milinda Antis, LCSWA Phone Number: 10/10/2020, 1:12 PM  Clinical Narrative:    CSW received notification from [facility] that they will not be able to accept the patient.    CSW called [facility] to inquire about their ability to accept.  CSW spoke with Arbie Cookey and she is reviewing the patient.  The patient does not have any bed offers at this time.    14:39-  [facility] has extended a bed offer.  The facility started auth on this day.  Expected Discharge Plan: Skilled Nursing Facility Barriers to Discharge: Insurance Authorization, SNF Pending bed offer  Expected Discharge Plan and Services Expected Discharge Plan: [place]   Discharge Planning Services: CM Consult   Living arrangements for the past 2 months: Single Family Home Expected Discharge Date: 10/06/20                                     Social Determinants of Health (SDOH) Interventions    Readmission Risk Interventions Readmission Risk Prevention Plan 02/02/2020  Post Dischage Appt Complete  Medication Screening Complete  Transportation Screening Complete  Some recent data might be hidden

## 2020-10-10 NOTE — Plan of Care (Signed)

## 2020-10-10 NOTE — Plan of Care (Signed)

## 2020-10-10 NOTE — Progress Notes (Signed)
Patient ID: David Fitzgerald, male   DOB: 1961/01/24, 60 y.o.   MRN: [id number] Is seen in follow-up status post left transtibial amputation.  There is no drainage in the wound VAC canister there are 2 checks.  Patient is awaiting [facility] approval [facility].

## 2020-10-10 NOTE — Consult Note (Signed)
   [facility] Inpatient Consult   10/10/2020  DAEVON HOLDREN 27-Sep-1960 [id number]  [facility] [ACO] Patient [employer] Medicare   Patient is currently noted as active with [facility] for chronic disease management services.  Patient has been contacted and has had several call attempts by a [facility] RN Care Management Coordinator.  Our community based plan of care has focused on disease management and community resource support but noted difficulty maintaining contact with patient.    Review reveals patient is being recommended for a skilled nursing facility level of care for post hospital transition.    Plan:  If patient transitions to a skilled nursing facility the patient's level of care is to be met for transition from the hospital and will make patient inactive status with [facility] Coordinator.   Of note, [facility] Care Management services does not replace or interfere with any services that are needed or arranged by inpatient [facility] care management team.  For additional questions or referrals please contact:   For questions contact:   Natividad Brood, RN BSN [facility] Liaison  [phone] business mobile phone Toll free office [phone]  Fax number: [phone] [email or web address]

## 2020-10-10 NOTE — Progress Notes (Signed)
Nutrition Follow-up  DOCUMENTATION CODES:   Morbid obesity  INTERVENTION:   -Continue Glucerna Shake po BID, each supplement provides 220 kcal and 10 grams of protein  -Continue Ensure Max po daily, each supplement provides 150 kcal and 30 grams of protein.   -Continue MVI with minerals daily  -Continue Magic cup TID with meals, each supplement provides 290 kcal and 9 grams of protein   NUTRITION DIAGNOSIS:   Increased nutrient needs related to post-op healing as evidenced by estimated needs.  Ongoing  GOAL:   Patient will meet greater than or equal to 90% of their needs  Progressing   MONITOR:   PO intake, Supplement acceptance, Labs, Weight trends, Skin, I & O's  REASON FOR ASSESSMENT:   Other (Comment)    ASSESSMENT:   Patient is a 60 year old gentleman who presents in follow-up with an acute episode of cellulitis medial left leg at the area of the proximal interlocking screw from the tibial calcaneal fusion for open Charcot collapse.  Patient states he was doing well until he was most recently hospitalized with A. fib.  Patient has developed ischemic changes to his foot though still has a good dorsalis pedis pulse.  He was started on oral antibiotics cultures were positive for routine skin flora deep within the wound no MRSA.  Despite oral antibiotics the drainage swelling and redness has worsened.  8/31- s/p PROCEDURE: Lt Transtibial amputation Application of Prevena wound VAC  Reviewed I/O's: -160 ml x 24 hours and -7.8 L since admission  UOP: 1.2 L x 24 hours  Stool output: 400 ml x 24 hours   Spoke with pt and mom at bedside. Pt reports good appetite PTA- he consumes 3 meals per day (meat, starch, and vegetable) and also snacks frequently on shredded wheat, peanut butter crackers, rice krispie treats, and cheese and crackers.  Pt reports good appetite during hospitalization and has been consuming most of his meals. Noted meal completion 75-100%. Pt reports  that he is consuming supplements when offered. He consumed Equate Nutritional drinks daily PTA per recommendation opf Dr. Sharol Given.   Per TOC notes, awaiting SNF placement.   Medications reviewed and include vitamin C, colace, lasix, vitamin B-12, and zinc sulfate.   Labs reviewed: CBGS: [id number] (inpatient orders for glycemic control are 500 mg metformin daily).    Diet Order:   Diet Order             Diet - low sodium heart healthy           Diet Carb Modified Fluid consistency: Thin; Room service appropriate? Yes  Diet effective now                   EDUCATION NEEDS:   Education needs have been addressed  Skin:  Skin Assessment: Skin Integrity Issues: Skin Integrity Issues:: Wound VAC Wound Vac: s/p lt BKA  Last BM:  10/09/20  Height:   Ht Readings from Last 1 Encounters:  10/04/20 '6\' 2"'$  (1.88 m)    Weight:   Wt Readings from Last 1 Encounters:  10/04/20 (!) 172.4 kg    Ideal Body Weight:  80.8 kg  BMI:  Body mass index is 48.79 kg/m.  Estimated Nutritional Needs:   Kcal:  2400-2600  Protein:  140-160 grams  Fluid:  > 2 L    Loistine Chance, RD, LDN, [place] Registered Dietitian II Certified Diabetes Care and Education Specialist Please refer to [facility] for RD and/or RD on-call/weekend/after hours pager

## 2020-10-11 LAB — SARS CORONAVIRUS 2 (TAT 6-24 HRS): SARS Coronavirus 2: NEGATIVE

## 2020-10-11 NOTE — Plan of Care (Signed)

## 2020-10-11 NOTE — Progress Notes (Signed)
Occupational Therapy Treatment Patient Details Name: David Fitzgerald MRN: [id number] DOB: 12/05/1960 Today's Date: 10/11/2020    History of present illness 60 y.o. male presenting to [facility] on 8/31. Patient with mechanical fall 01/2020 with resulting open L ankle fx s/p ORIF.  Presented with hardware failure s/p repair 04/2020 with skin graft. Now has acute episode of cellulitis medial left leg at the area of the proximal interlocking screw from the tibial calcaneal fusion for open Charcot collapse.  Patient has developed ischemic changes to his foot despite oral antibiotics the drainage swelling and redness has worsened.  Underwent BKA on LLE 8/31, referred to PT for mobility.   PMHx:  Bells palsy, anxiety, a-fib, chronic venous insufficiency, depression, DM, Edema, HTN, IBS, back pain with LLE radiation, morbid obesity, hypoventilation syndrome, PNA, OSA, pernicious anemia, L ankle wound dehiscence, trigeminal neuralgia, PN,   OT comments  Patient seated in recliner and agreeable to OT session.  Focused on LB self care and safety.  Educated on use of AE for R sock mgmt, setup assist using sock aide/reacher.  Min assist for standing and educated on 1 handed techniques for clothing mgmt/toileting, able to complete with min guard fading to min assist as pt fatigues quickly in standing and relies on BUE support.  Updated dc recommendations to SNF as believe pt continues to require rehab prior to dc home to optimize independence and safety.    Follow Up Recommendations  SNF    Equipment Recommendations  None recommended by OT    Recommendations for Other Services      Precautions / Restrictions Precautions Precautions: Fall Precaution Comments: L BKA; monitor HR Required Braces or Orthoses: Other Brace Other Brace: L limb protector, has wound vac Restrictions Weight Bearing Restrictions: Yes LLE Weight Bearing: Non weight bearing       Mobility Bed Mobility Overal bed mobility: Needs  Assistance Bed Mobility: Supine to Sit     Supine to sit: Supervision     General bed mobility comments: OOB in recliner upon entry    Transfers Overall transfer level: Needs assistance Equipment used: Rolling walker (2 wheeled) Transfers: Sit to/from Stand Sit to Stand: Min assist         General transfer comment: min assist to power up and steady with cueing for hand placement and safety    Balance Overall balance assessment: Needs assistance Sitting-balance support: Feet supported Sitting balance-[name]: Good     Standing balance support: Bilateral upper extremity supported;During functional activity;Single extremity supported Standing balance-[name]: Poor Standing balance comment: relies on UE support, able to manage with 1 UE support for ADLs but limited tolerance                           ADL either performed or assessed with clinical judgement   ADL Overall ADL's : Needs assistance/impaired                     Lower Body Dressing: Sit to/from stand;Minimal assistance Lower Body Dressing Details (indicate cue type and reason): educated on use of reacher/sock aide managing R sock with setup assist, assist in standing for balance and clothing mgmt with education on 1 handed techniques Toilet Transfer: Minimal assistance;RW Toilet Transfer Details (indicate cue type and reason): simulated in room         Functional mobility during ADLs: Minimal assistance;Rolling walker       Vision  Perception     Praxis      Cognition Arousal/Alertness: Awake/alert Behavior During Therapy: WFL for tasks assessed/performed Overall Cognitive Status: Within Functional Limits for tasks assessed                                          Exercises Exercises: Other exercises General Exercises - Lower Extremity Ankle Circles/Pumps: AROM;Right;5 reps Quad Sets: AROM;10 reps;Seated;Both Gluteal Sets: AROM;10  reps;Both;Seated Amputee Exercises Towel Squeeze: AROM;Both;10 reps;Supine Hip ABduction/ADduction: AROM;Left;10 reps;Supine Hip Flexion/Marching: AROM;Left;10 reps;Supine Knee Flexion: AROM;Left;10 reps;Supine Straight Leg Raises: AROM;Left;10 reps;Supine Other Exercises Other Exercises: chair pushups x 5   Shoulder Instructions       General Comments wearing limb protector, reports managing with independence    Pertinent Vitals/ Pain       Pain Assessment: Faces Faces Pain Scale: Hurts a little bit Pain Location: L LE Pain Descriptors / Indicators: Discomfort;Tightness Pain Intervention(s): Limited activity within patient's tolerance;Monitored during session;Repositioned  Home Living                                          Prior Functioning/Environment              Frequency  Min 3X/week        Progress Toward Goals  OT Goals(current goals can now be found in the care plan section)  Progress towards OT goals: Progressing toward goals  Acute Rehab OT Goals Patient Stated Goal: Independence OT Goal Formulation: With patient  Plan Frequency remains appropriate;Discharge plan needs to be updated    Co-evaluation                 AM-PAC OT "6 Clicks" Daily Activity     Outcome Measure   Help from another person eating meals?: None Help from another person taking care of personal grooming?: A Little Help from another person toileting, which includes using toliet, bedpan, or urinal?: A Lot Help from another person bathing (including washing, rinsing, drying)?: A Little Help from another person to put on and taking off regular upper body clothing?: A Little Help from another person to put on and taking off regular lower body clothing?: A Little 6 Click Score: 18    End of Session Equipment Utilized During Treatment: Rolling walker;Gait belt  OT Visit Diagnosis: Unsteadiness on feet (R26.81);Muscle weakness (generalized) (M62.81)    Activity Tolerance Patient tolerated treatment well   Patient Left in chair;with call bell/phone within reach;with chair alarm set   Nurse Communication Mobility status        Time: [id number] OT Time Calculation (min): 22 min  Charges: OT General Charges $OT Visit: 1 Visit OT Treatments $Self Care/Home Management : 8-22 mins  Jolaine Artist, OT Acute Rehabilitation Services Pager [phone] Office [place] 10/11/2020, 1:53 PM

## 2020-10-11 NOTE — Progress Notes (Signed)
Patient is status post below-knee amputation.  He is doing very well.  Wound VAC canister is functioning well with 2 green checks.  Hopefully can discharge to nursing facility.  New COVID test ordered as old 44 is 97 days old

## 2020-10-11 NOTE — TOC Progression Note (Signed)
Transition of Care [facility]) - Initial/Assessment Note    Patient Details  Name: David Fitzgerald MRN: [id number] Date of Birth: 05/04/1960  Transition of Care [facility]) CM/SW Contact:    Milinda Antis, [place] Phone Number: 10/11/2020, 4:09 PM  Clinical Narrative:                 CSW was contacted by [facility].  They have received authorization and can accept the patient tomorrow.  Care team notified.    COVID test is pending.   Expected Discharge Plan: Skilled Nursing Facility Barriers to Discharge: Insurance Authorization, SNF Pending bed offer   Patient Goals and CMS Choice Patient states their goals for this hospitalization and ongoing recovery are:: To get home CMS Medicare.gov Compare Post Acute Care list provided to:: Patient Choice offered to / list presented to : Patient, Parent  Expected Discharge Plan and Services Expected Discharge Plan: [place]   Discharge Planning Services: CM Consult   Living arrangements for the past 2 months: Single Family Home Expected Discharge Date: 10/06/20                                    Prior Living Arrangements/Services Living arrangements for the past 2 months: Single Family Home Lives with:: Self Patient language and need for interpreter reviewed:: Yes Do you feel safe going back to the place where you live?: Yes      Need for Family Participation in Patient Care: Yes (Comment) Care giver support system in place?: Yes (comment)   Criminal Activity/Legal Involvement Pertinent to Current Situation/Hospitalization: No - Comment as needed  Activities of Daily Living Home Assistive Devices/Equipment: Dentures (specify type), Eyeglasses ADL Screening (condition at time of admission) Patient's cognitive ability adequate to safely complete daily activities?: Yes Is the patient deaf or have difficulty hearing?: No Does the patient have difficulty seeing, even when wearing glasses/contacts?: No Does the  patient have difficulty concentrating, remembering, or making decisions?: No Patient able to express need for assistance with ADLs?: Yes Does the patient have difficulty dressing or bathing?: No Independently performs ADLs?: Yes (appropriate for developmental age) Does the patient have difficulty walking or climbing stairs?: Yes Weakness of Legs: None Weakness of Arms/Hands: None  Permission Sought/Granted   Permission granted to share information with : Yes, Verbal Permission Granted  Share Information with NAME: Aki Dibella (Mother) [phone]  Permission granted to share info w AGENCY: SNF        Emotional Assessment Appearance:: Appears stated age Attitude/Demeanor/Rapport: Engaged Affect (typically observed): Accepting Orientation: : Oriented to Self, Oriented to Place, Oriented to  Time, Oriented to Situation Alcohol / Substance Use: Not Applicable Psych Involvement: No (comment)  Admission diagnosis:  Abscess of bursa of left ankle [M71.072] Patient Active Problem List   Diagnosis Date Noted   Abscess of bursa of left ankle 10/04/2020   Subacute osteomyelitis, left ankle and foot ([place])    Abscess of ankle    Asthma 07/31/2020   Depression 07/31/2020   Dysrhythmia 07/31/2020   GERD (gastroesophageal reflux disease) 07/31/2020   History of kidney stones 07/31/2020   Pneumonia 07/31/2020   Left ankle pain 04/05/2020   Open dislocation of left ankle    A-fib (HCC)    OSA (obstructive sleep apnea) 03/09/2020   Wound dehiscence, traumatic injury repair, left medial ankle  03/08/2020   Vitamin D deficiency 02/16/2020   Chronic, continuous use of opioids  02/15/2020   Open fracture dislocation of left ankle 01/31/2020   Ankle fracture 01/31/2020   Permanent atrial fibrillation ([place])    Hypertension    Diabetes ([place])    Bee sting allergy    Hypotension 12/25/2017   Chronic anticoagulation 03/07/2017   Coronary artery calcification seen on CT scan 02/07/2017   CAD  in native artery 02/07/2017   Persistent atrial fibrillation ([place]) 02/06/2017   Anxiety 02/06/2017   Arthritis 02/06/2017   Pernicious anemia 02/06/2017   Allergy to pollen 04/06/2015   OSA treated with BiPAP 12/14/2014   Obesity hypoventilation syndrome ([place]) 12/14/2014   Morbid obesity with BMI of 40.0-44.9, adult ([place]) 12/14/2014   Hypertensive heart disease 12/14/2014   Hyperlipidemia, mixed 12/14/2014   Type 2 diabetes mellitus with diabetic neuropathy ([place]) 12/14/2014   Chronic venous insufficiency 12/14/2014   Edema 12/14/2014   Morbid obesity ([place]) 12/14/2014   Bell's palsy 02/24/2013   Polyneuropathy in diabetes ([place]) 02/24/2013   Syndrome affecting cervical region 02/24/2013   Trigeminal neuralgia 02/24/2013   Lumbar pain with radiation down left leg 07/09/2012   BMI 45.0-49.9, adult ([place]) 07/09/2012   Chronic pain syndrome 07/09/2012   Morbid obesity with BMI of 45.0-49.9, adult ([place]) 07/09/2012   Pain syndrome, chronic 07/09/2012   PCP:  Angelina Sheriff, MD Pharmacy:   [facility] [place], [place] - [address] AT [address] [place] [address] Phone: [phone] Fax: [phone]     Social Determinants of Health ([place]) Interventions    Readmission Risk Interventions Readmission Risk Prevention Plan 02/02/2020  Post Dischage Appt Complete  Medication Screening Complete  Transportation Screening Complete  Some recent data might be hidden

## 2020-10-11 NOTE — Progress Notes (Signed)
Physical Therapy Treatment Patient Details Name: David Fitzgerald MRN: [id number] DOB: [birth year] Today's Date: 10/11/2020    History of Present Illness 60 y.o. male presenting to [facility] on 8/31. Patient with mechanical fall 01/2020 with resulting open L ankle fx s/p ORIF.  Presented with hardware failure s/p repair 04/2020 with skin graft. Now has acute episode of cellulitis medial left leg at the area of the proximal interlocking screw from the tibial calcaneal fusion for open Charcot collapse.  Patient has developed ischemic changes to his foot despite oral antibiotics the drainage swelling and redness has worsened.  Underwent BKA on LLE 8/31, referred to PT for mobility.   PMHx:  Bells palsy, anxiety, a-fib, chronic venous insufficiency, depression, DM, Edema, HTN, IBS, back pain with LLE radiation, morbid obesity, hypoventilation syndrome, PNA, OSA, pernicious anemia, L ankle wound dehiscence, trigeminal neuralgia, PN,    PT Comments    Pt admitted with above diagnosis. Pt was able to ambulate with RW with min assist with limited distance due to fatigues quickly with pt needing chair follow. Pt educated on HEP.  Pt appreciative and is progessing.  Pt currently with functional limitations due to balance and endurance deficits. Pt will benefit from skilled PT to increase their independence and safety with mobility to allow discharge to the venue listed below.      Follow Up Recommendations  SNF     Equipment Recommendations  Other (comment) (Pt states he needs a 27 inch wide wheelchair for his doors at home but unsure if a smaller wheelchiar will work for his habitus)    Recommendations for Other Services       Precautions / Restrictions Precautions Precautions: Fall Precaution Comments: L BKA; monitor HR Required Braces or Orthoses: Other Brace Other Brace: L limb protector, has wound vac Restrictions Weight Bearing Restrictions: Yes LLE Weight Bearing: Non weight bearing    Mobility   Bed Mobility Overal bed mobility: Needs Assistance Bed Mobility: Supine to Sit     Supine to sit: Supervision     General bed mobility comments: No assist to come to eOB    Transfers Overall transfer level: Needs assistance Equipment used: Rolling walker (2 wheeled) Transfers: Sit to/from [facility] Sit to Stand: Min assist;+2 safety/equipment;From elevated surface         General transfer comment: Pt needed min assist to power up with cues for hand placement and foot placement.  Ambulation/Gait Ambulation/Gait assistance: Min assist;+2 safety/equipment Gait Distance (Feet): 12 Feet (12 feet x 2) Assistive device: Rolling walker (2 wheeled) Gait Pattern/deviations: Step-to pattern Gait velocity: reduced   General Gait Details: Pt was able to ambulate a short distance with RW wtih good balance. Just fatigues quickly and needs chair follow but does give warning when he needs to sit.   Stairs             Wheelchair Mobility    Modified Rankin (Stroke Patients Only)       Balance Overall balance assessment: Needs assistance Sitting-balance support: Single extremity supported;Bilateral upper extremity supported Sitting balance-[name]: Good     Standing balance support: Bilateral upper extremity supported;During functional activity Standing balance-[name]: Poor Standing balance comment: Reliant on BUE on RW with static/dynamic balance.                            Cognition Arousal/Alertness: Awake/alert Behavior During Therapy: WFL for tasks assessed/performed Overall Cognitive Status: Within Functional Limits for tasks  assessed                                        Exercises General Exercises - Lower Extremity Ankle Circles/Pumps: AROM;Right;5 reps Quad Sets: AROM;10 reps;Seated;Both Gluteal Sets: AROM;10 reps;Both;Seated Amputee Exercises Towel Squeeze: AROM;Both;10 reps;Supine Hip  ABduction/ADduction: AROM;Left;10 reps;Supine Hip Flexion/Marching: AROM;Left;10 reps;Supine Knee Flexion: AROM;Left;10 reps;Supine Straight Leg Raises: AROM;Left;10 reps;Supine    General Comments General comments (skin integrity, edema, etc.): Wearing limb protector, VSS      Pertinent Vitals/Pain Pain Assessment: Faces Faces Pain Scale: Hurts little more Pain Location: reports pain in LLE Pain Descriptors / Indicators: Cramping;Tightness Pain Intervention(s): Limited activity within patient's tolerance;Monitored during session;Repositioned;RN gave pain meds during session    Home Living                      Prior Function            PT Goals (current goals can now be found in the care plan section) Acute Rehab PT Goals Patient Stated Goal: Independence Progress towards PT goals: Progressing toward goals    Frequency    Min 3X/week      PT Plan Current plan remains appropriate;Frequency needs to be updated    Co-evaluation              AM-PAC PT "6 Clicks" Mobility   Outcome Measure  Help needed turning from your back to your side while in a flat bed without using bedrails?: None Help needed moving from lying on your back to sitting on the side of a flat bed without using bedrails?: A Little Help needed moving to and from a bed to a chair (including a wheelchair)?: A Little Help needed standing up from a chair using your arms (e.g., wheelchair or bedside chair)?: A Lot Help needed to walk in hospital room?: A Little Help needed climbing 3-5 steps with a railing? : Total 6 Click Score: 16    End of Session Equipment Utilized During Treatment: Gait belt Activity Tolerance: Patient limited by fatigue;Patient limited by pain Patient left: in chair;with chair alarm set;with call bell/phone within reach Nurse Communication: Mobility status PT Visit Diagnosis: Unsteadiness on feet (R26.81);Muscle weakness (generalized) (M62.81);Pain Pain - Right/Left:  Left Pain - part of body: Leg     Time: [id number] PT Time Calculation (min) (ACUTE ONLY): 28 min  Charges:  $Gait Training: 8-22 mins $Therapeutic Exercise: 8-22 mins                     Quadir Muns M,PT Acute Rehab Services [phone] (pager)    Alvira Philips 10/11/2020, 12:46 PM

## 2020-10-12 DIAGNOSIS — R262 Difficulty in walking, not elsewhere classified: Secondary | ICD-10-CM | POA: Diagnosis not present

## 2020-10-12 DIAGNOSIS — D6869 Other thrombophilia: Secondary | ICD-10-CM | POA: Diagnosis not present

## 2020-10-12 DIAGNOSIS — R0902 Hypoxemia: Secondary | ICD-10-CM | POA: Diagnosis not present

## 2020-10-12 DIAGNOSIS — Z4781 Encounter for orthopedic aftercare following surgical amputation: Secondary | ICD-10-CM | POA: Diagnosis not present

## 2020-10-12 DIAGNOSIS — I10 Essential (primary) hypertension: Secondary | ICD-10-CM | POA: Diagnosis not present

## 2020-10-12 DIAGNOSIS — Z89512 Acquired absence of left leg below knee: Secondary | ICD-10-CM | POA: Diagnosis not present

## 2020-10-12 DIAGNOSIS — F330 Major depressive disorder, recurrent, mild: Secondary | ICD-10-CM | POA: Diagnosis not present

## 2020-10-12 DIAGNOSIS — I4891 Unspecified atrial fibrillation: Secondary | ICD-10-CM | POA: Diagnosis not present

## 2020-10-12 DIAGNOSIS — M6281 Muscle weakness (generalized): Secondary | ICD-10-CM | POA: Diagnosis not present

## 2020-10-12 DIAGNOSIS — I2510 Atherosclerotic heart disease of native coronary artery without angina pectoris: Secondary | ICD-10-CM | POA: Diagnosis not present

## 2020-10-12 DIAGNOSIS — R41841 Cognitive communication deficit: Secondary | ICD-10-CM | POA: Diagnosis not present

## 2020-10-12 DIAGNOSIS — F339 Major depressive disorder, recurrent, unspecified: Secondary | ICD-10-CM | POA: Diagnosis not present

## 2020-10-12 DIAGNOSIS — Z7401 Bed confinement status: Secondary | ICD-10-CM | POA: Diagnosis not present

## 2020-10-12 DIAGNOSIS — I480 Paroxysmal atrial fibrillation: Secondary | ICD-10-CM | POA: Diagnosis not present

## 2020-10-12 DIAGNOSIS — E119 Type 2 diabetes mellitus without complications: Secondary | ICD-10-CM | POA: Diagnosis not present

## 2020-10-12 DIAGNOSIS — R2681 Unsteadiness on feet: Secondary | ICD-10-CM | POA: Diagnosis not present

## 2020-10-12 DIAGNOSIS — J449 Chronic obstructive pulmonary disease, unspecified: Secondary | ICD-10-CM | POA: Diagnosis not present

## 2020-10-12 DIAGNOSIS — I509 Heart failure, unspecified: Secondary | ICD-10-CM | POA: Diagnosis not present

## 2020-10-12 NOTE — TOC Transition Note (Signed)
Transition of Care [facility]) - CM/SW Discharge Note   Patient Details  Name: David Fitzgerald MRN: [id number] Date of Birth: [birth year]  [facility] CM/SW Contact:  Milinda Antis, [place] Phone Number: 10/12/2020, 9:50 AM   Clinical Narrative:    Patient will DC to:  SNF Anticipated DC date:  10/12/2020 Transport by: Corey Harold   Per MD patient ready for DC to SNF. RN to call report prior to discharge ([phone] and ask for the 300 floor RN. RN, patient, patient's family, and facility notified of DC. Discharge Summary and FL2 sent to facility. DC packet on chart. Ambulance transport will be requested for patient when RN reports that patient is ready.   CSW will sign off for now as social work intervention is no longer needed. Please consult [place] again if new needs arise.     Final next level of care: Skilled Nursing Facility Barriers to Discharge: Barriers Resolved   Patient Goals and CMS Choice Patient states their goals for this hospitalization and ongoing recovery are:: To get home CMS Medicare.gov Compare Post Acute Care list provided to:: Patient Choice offered to / list presented to : Patient, Parent  Discharge Placement              Patient chooses bed at:  ([facility]) Patient to be transferred to facility by: [place] Name of family member notified: Patient alert and oriented Patient and family notified of of transfer: 10/12/20  Discharge Plan and Services   Discharge Planning Services: CM Consult                                 Social Determinants of Health ([place]) Interventions     Readmission Risk Interventions Readmission Risk Prevention Plan 02/02/2020  Post Dischage Appt Complete  Medication Screening Complete  Transportation Screening Complete  Some recent data might be hidden

## 2020-10-12 NOTE — Discharge Summary (Signed)
Discharge Diagnoses:  Active Problems:   Subacute osteomyelitis, left ankle and foot (HCC)   Abscess of ankle   Abscess of bursa of left ankle   Surgeries: Procedure(s): LEFT BELOW KNEE AMPUTATION on 10/04/2020    Consultants:   Discharged Condition: Improved  Hospital Course: David Fitzgerald is an 59 y.o. male who was admitted 10/04/2020 with a chief complaint of left ankle osteomyeliytis, with a final diagnosis of Infected Left Ankle.  Patient was brought to the operating room on 10/04/2020 and underwent Procedure(s): LEFT BELOW KNEE AMPUTATION.    Patient was given perioperative antibiotics:  Anti-infectives (From admission, onward)    Start     Dose/Rate Route Frequency Ordered Stop   10/04/20 1315  vancomycin (VANCOCIN) IVPB 1000 mg/200 mL premix        1,000 mg 200 mL/hr over 60 Minutes Intravenous Every 12 hours 10/04/20 1307 10/05/20 0256   10/04/20 1000  clindamycin (CLEOCIN) IVPB 900 mg        900 mg 100 mL/hr over 30 Minutes Intravenous On call to O.R. 10/04/20 1115 10/04/20 1126     .  Patient was given sequential compression devices, early ambulation, and aspirin for DVT prophylaxis.  Recent vital signs: Patient Vitals for the past 24 hrs:  BP Temp Temp src Pulse Resp SpO2  10/11/20 2242 108/67 98.1 F (36.7 C) Oral 95 16 93 %  10/11/20 1859 (!) 140/58 98.1 F (36.7 C) Oral 87 17 90 %  10/11/20 1621 (!) 136/55 97.9 F (36.6 C) Oral 97 -- 94 %  10/11/20 0746 110/68 98.7 F (37.1 C) Oral 76 16 94 %  .  Recent laboratory studies: No results found.  Discharge Medications:   Allergies as of 10/12/2020       Reactions   Bee Venom Anaphylaxis, Swelling   Penicillins Swelling, Other (See Comments)   CEPHALOSPORIN TOLERANT 01/31/2020- "Allergic," per paperwork from facility   Aspirin Other (See Comments)   Relative contraindication due to hematochezia while on it- "Allergic," per paperwork from facility   Fentanyl    Heart rate went up and down, BP went up and  down Made him jittery.        Medication List     STOP taking these medications    dapagliflozin propanediol 10 MG Tabs tablet Commonly known as: Farxiga   doxycycline 100 MG tablet Commonly known as: VIBRA-TABS   oxyCODONE-acetaminophen 7.5-325 MG tablet Commonly known as: PERCOCET       TAKE these medications    ascorbic acid 1000 MG tablet Commonly known as: VITAMIN C Take 1,000 mg by mouth daily.   cyanocobalamin 1000 MCG tablet Take 1 tablet (1,000 mcg total) by mouth daily. What changed: how much to take   diazepam 5 MG tablet Commonly known as: VALIUM Take 5 mg by mouth every 6 (six) hours as needed for anxiety.   dicyclomine 20 MG tablet Commonly known as: BENTYL Take 20 mg by mouth at bedtime.   DULoxetine 60 MG capsule Commonly known as: CYMBALTA Take 60 mg by mouth daily.   Eliquis 5 MG Tabs tablet Generic drug: apixaban TAKE 1 TABLET BY MOUTH TWICE DAILY   EPINEPHrine 0.3 mg/0.3 mL Soaj injection Commonly known as: EPI-PEN Inject 0.3 mg into the muscle as needed for anaphylaxis.   feeding supplement (GLUCERNA SHAKE) Liqd Take 237 mLs by mouth 2 (two) times daily between meals.   furosemide 40 MG tablet Commonly known as: LASIX Take 1 tablet (40 mg total) by mouth daily.  What changed:  when to take this reasons to take this   gabapentin 600 MG tablet Commonly known as: NEURONTIN Take 600 mg by mouth 3 (three) times daily.   metFORMIN 500 MG tablet Commonly known as: GLUCOPHAGE Take 500 mg by mouth daily with breakfast.   metoprolol tartrate 50 MG tablet Commonly known as: LOPRESSOR Take 50 mg by mouth 3 (three) times daily.   naloxone 4 MG/0.1ML Liqd nasal spray kit Commonly known as: NARCAN Place 1 spray into the nose See admin instructions. OPIOID OVERDOSE EMERGENCY   naproxen sodium 220 MG tablet Commonly known as: ALEVE Take 440 mg by mouth daily as needed (for headaches).   nitroGLYCERIN 0.4 MG SL tablet Commonly  known as: NITROSTAT Place 1 tablet (0.4 mg total) under the tongue every 5 (five) minutes as needed for chest pain.   Oxycodone HCl 10 MG Tabs Take 1-1.5 tablets (10-15 mg total) by mouth every 4 (four) hours as needed for severe pain (pain score 7-10).   pantoprazole 40 MG tablet Commonly known as: PROTONIX Take 40 mg by mouth daily.   sacubitril-valsartan 24-26 MG Commonly known as: ENTRESTO Take 1 tablet by mouth 2 (two) times daily.   simvastatin 20 MG tablet Commonly known as: ZOCOR Take 20 mg by mouth at bedtime.   Turmeric 500 MG Caps Take 500 mg by mouth daily.   Vitamin D3 125 MCG (5000 UT) Tabs Take 5,000 Units by mouth daily.   zinc sulfate 220 (50 Zn) MG capsule Take 1 capsule (220 mg total) by mouth daily.        Diagnostic Studies: XR Ankle 2 Views Left  Result Date: 09/28/2020 2 view radiographs of the left ankle shows progressive lytic changes at the proximal interlocking screw there is no hardware failure no loss of reduction.  The fusion alignment is stable.   Patient benefited maximally from their hospital stay and there were no complications.     Disposition: Discharge disposition: 03-Skilled Nursing Facility      Discharge Instructions     Call MD / Call 911   Complete by: As directed    If you experience chest pain or shortness of breath, CALL 911 and be transported to the hospital emergency room.  If you develope a fever above 101 F, pus (white drainage) or increased drainage or redness at the wound, or calf pain, call your surgeon's office.   Call MD / Call 911   Complete by: As directed    If you experience chest pain or shortness of breath, CALL 911 and be transported to the hospital emergency room.  If you develope a fever above 101 F, pus (white drainage) or increased drainage or redness at the wound, or calf pain, call your surgeon's office.   Constipation Prevention   Complete by: As directed    Drink plenty of fluids.  Prune juice  may be helpful.  You may use a stool softener, such as Colace (over the counter) 100 mg twice a day.  Use MiraLax (over the counter) for constipation as needed.   Constipation Prevention   Complete by: As directed    Drink plenty of fluids.  Prune juice may be helpful.  You may use a stool softener, such as Colace (over the counter) 100 mg twice a day.  Use MiraLax (over the counter) for constipation as needed.   Diet - low sodium heart healthy   Complete by: As directed    Diet - low sodium heart healthy  Complete by: As directed    Discharge instructions   Complete by: As directed    Call ofice if vac alarms   Increase activity slowly as tolerated   Complete by: As directed    Increase activity slowly as tolerated   Complete by: As directed    Negative Pressure Wound Therapy - Incisional   Complete by: As directed    Show patient how to attach preveena vac   Post-operative opioid taper instructions:   Complete by: As directed    POST-OPERATIVE OPIOID TAPER INSTRUCTIONS: It is important to wean off of your opioid medication as soon as possible. If you do not need pain medication after your surgery it is ok to stop day one. Opioids include: Codeine, Hydrocodone(Norco, Vicodin), Oxycodone(Percocet, oxycontin) and hydromorphone amongst others.  Long term and even short term use of opiods can cause: Increased pain response Dependence Constipation Depression Respiratory depression And more.  Withdrawal symptoms can include Flu like symptoms Nausea, vomiting And more Techniques to manage these symptoms Hydrate well Eat regular healthy meals Stay active Use relaxation techniques(deep breathing, meditating, yoga) Do Not substitute Alcohol to help with tapering If you have been on opioids for less than two weeks and do not have pain than it is ok to stop all together.  Plan to wean off of opioids This plan should start within one week post op of your joint replacement. Maintain  the same interval or time between taking each dose and first decrease the dose.  Cut the total daily intake of opioids by one tablet each day Next start to increase the time between doses. The last dose that should be eliminated is the evening dose.      Post-operative opioid taper instructions:   Complete by: As directed    POST-OPERATIVE OPIOID TAPER INSTRUCTIONS: It is important to wean off of your opioid medication as soon as possible. If you do not need pain medication after your surgery it is ok to stop day one. Opioids include: Codeine, Hydrocodone(Norco, Vicodin), Oxycodone(Percocet, oxycontin) and hydromorphone amongst others.  Long term and even short term use of opiods can cause: Increased pain response Dependence Constipation Depression Respiratory depression And more.  Withdrawal symptoms can include Flu like symptoms Nausea, vomiting And more Techniques to manage these symptoms Hydrate well Eat regular healthy meals Stay active Use relaxation techniques(deep breathing, meditating, yoga) Do Not substitute Alcohol to help with tapering If you have been on opioids for less than two weeks and do not have pain than it is ok to stop all together.  Plan to wean off of opioids This plan should start within one week post op of your joint replacement. Maintain the same interval or time between taking each dose and first decrease the dose.  Cut the total daily intake of opioids by one tablet each day Next start to increase the time between doses. The last dose that should be eliminated is the evening dose.          Follow-up Information     Suzan Slick, NP Follow up in 1 week(s).   Specialty: Orthopedic Surgery Contact information: [address] [place] [address] [phone]                  Signed: Bevely Palmer Judaea Burgoon 10/12/2020, 7:11 AM

## 2020-10-12 NOTE — Progress Notes (Signed)
Patient is 1 week status post left below-knee amputation.  He is doing well and has been improved for skilled nursing.   His wound VAC is still functioning quite well.  He has 2 green checks should be able to transition to Praveena pump easily.  This will be in place for 1 week at which time it will be removed in our office.  His vital signs are stable and he is in agreement for transfer to a skilled nursing today medication for pain is printed and on his chart

## 2020-10-12 NOTE — Progress Notes (Signed)
Report was called to Jana Half, LPN and [facility].

## 2020-10-13 ENCOUNTER — Other Ambulatory Visit: Payer: Self-pay

## 2020-10-13 DIAGNOSIS — Z89512 Acquired absence of left leg below knee: Secondary | ICD-10-CM | POA: Diagnosis not present

## 2020-10-13 DIAGNOSIS — R262 Difficulty in walking, not elsewhere classified: Secondary | ICD-10-CM | POA: Diagnosis not present

## 2020-10-13 DIAGNOSIS — J449 Chronic obstructive pulmonary disease, unspecified: Secondary | ICD-10-CM | POA: Diagnosis not present

## 2020-10-13 DIAGNOSIS — I4891 Unspecified atrial fibrillation: Secondary | ICD-10-CM | POA: Diagnosis not present

## 2020-10-13 NOTE — Patient Outreach (Signed)
[facility]) Care Management  10/13/2020  ELIAM THILGES [birth year] [id number]   Patient active with CM, however patient was hospitalized and discharged to SNF.  Plan: RN CM will close case.    [name], RN, MSN [facility] Care Management Coordinator Direct Line [phone] Cell [phone] Toll Free: [phone]  Fax: [phone]

## 2020-10-14 ENCOUNTER — Other Ambulatory Visit: Payer: Self-pay | Admitting: Cardiology

## 2020-10-17 NOTE — Telephone Encounter (Signed)
Metoprolol tartrate 50 mg # 270 only sent to   [facility], [place] - [id number] AT [place]

## 2020-10-18 DIAGNOSIS — D6869 Other thrombophilia: Secondary | ICD-10-CM | POA: Diagnosis not present

## 2020-10-18 DIAGNOSIS — I2510 Atherosclerotic heart disease of native coronary artery without angina pectoris: Secondary | ICD-10-CM | POA: Diagnosis not present

## 2020-10-18 DIAGNOSIS — Z89512 Acquired absence of left leg below knee: Secondary | ICD-10-CM | POA: Diagnosis not present

## 2020-10-18 DIAGNOSIS — R262 Difficulty in walking, not elsewhere classified: Secondary | ICD-10-CM | POA: Diagnosis not present

## 2020-10-19 ENCOUNTER — Ambulatory Visit (INDEPENDENT_AMBULATORY_CARE_PROVIDER_SITE_OTHER): Payer: Medicare HMO | Admitting: Orthopedic Surgery

## 2020-10-19 ENCOUNTER — Other Ambulatory Visit: Payer: Self-pay

## 2020-10-19 ENCOUNTER — Encounter: Payer: Self-pay | Admitting: Orthopedic Surgery

## 2020-10-19 DIAGNOSIS — Z89512 Acquired absence of left leg below knee: Secondary | ICD-10-CM

## 2020-10-19 NOTE — Progress Notes (Signed)
Office Visit Note   Patient: David Fitzgerald           Date of Birth: 07/20/1960           MRN: [id number] Visit Date: 10/19/2020              Requested by: Angelina Sheriff, MD [address] [place],  [place] [address] PCP: Angelina Sheriff, MD  Chief Complaint  Patient presents with   Left Leg - Routine Post Op    10/04/20 left BKA  D/c vac today no drainage in canister. Wearing vive shrinker and limb protector [employer]  pt is currently in SNF [facility] in [place]      HPI: Patient is a 60 year old gentleman who presents 2 weeks status post left transtibial amputation.  The wound VAC is removed in the office he has been using his limb protector.  Patient is currently at skilled nursing at [facility].  Assessment & Plan: Visit Diagnoses:  1. Hx of BKA, left ([place])     Plan: Recommended a size 3 extra-large stump shrinker he was given a prescription for [employer] for a K3 level prosthesis.  Also recommended DME for discharge to home with home health therapy.  I feel the patient would be safe for discharge to home at this time.  Follow-Up Instructions: Return in about 2 weeks (around 11/02/2020).   Ortho Exam  Patient is alert, oriented, no adenopathy, well-dressed, normal affect, normal respiratory effort. Examination the incision is healing nicely is well approximated there is no redness no cellulitis no signs of infection.  Patient is currently wearing a 4 extra-large shrinker.  Imaging: No results found.   Labs: Lab Results  Component Value Date   HGBA1C 6.0 (H) 03/08/2020   HGBA1C 6.0 (H) 02/04/2020   REPTSTATUS 03/15/2020 FINAL 03/10/2020   GRAMSTAIN  03/10/2020    MODERATE WBC PRESENT, PREDOMINANTLY PMN NO ORGANISMS SEEN    CULT  03/10/2020    RARE ENTEROBACTER CLOACAE CRITICAL RESULT CALLED TO, READ BACK BY AND VERIFIED WITH: RN TGilford Rile [phone] FCP NO ANAEROBES ISOLATED Performed at [facility], [place] [address]  [place],  [address]    [place] 03/10/2020     Lab Results  Component Value Date   ALBUMIN 2.7 (L) 03/10/2020   ALBUMIN 2.9 (L) 03/08/2020   ALBUMIN 2.9 (L) 02/16/2020    Lab Results  Component Value Date   MG 2.1 02/08/2020   MG 2.0 02/04/2020   MG 1.9 02/03/2020   Lab Results  Component Value Date   VD25OH 19.57 (L) 03/08/2020   VD25OH 16.36 (L) 02/16/2020    No results found for: PREALBUMIN CBC EXTENDED Latest Ref Rng & Units 10/06/2020 10/05/2020 10/04/2020  WBC 4.0 - 10.5 K/uL 11.1(H) 10.6(H) 11.8(H)  RBC 4.22 - 5.81 MIL/uL 4.43 4.50 5.22  HGB 13.0 - 17.0 g/dL 11.2(L) 11.4(L) 13.3  HCT 39.0 - 52.0 % 38.4(L) 38.7(L) 44.4  PLT 150 - 400 K/uL 331 280 327  NEUTROABS 1.7 - 7.7 K/uL - - -  LYMPHSABS 0.7 - 4.0 K/uL - - -     There is no height or weight on file to calculate BMI.  Orders:  No orders of the defined types were placed in this encounter.  No orders of the defined types were placed in this encounter.    Procedures: No procedures performed  Clinical Data: No additional findings.  ROS:  All other systems negative, except  as noted in the HPI. Review of Systems  Objective: Vital Signs: There were no vitals taken for this visit.  Specialty Comments:  No specialty comments available.  PMFS History: Patient Active Problem List   Diagnosis Date Noted   Abscess of bursa of left ankle 10/04/2020   Subacute osteomyelitis, left ankle and foot (HCC)    Abscess of ankle    Asthma 07/31/2020   Depression 07/31/2020   Dysrhythmia 07/31/2020   GERD (gastroesophageal reflux disease) 07/31/2020   History of kidney stones 07/31/2020   Pneumonia 07/31/2020   Left ankle pain 04/05/2020   Open dislocation of left ankle    A-fib (HCC)    OSA (obstructive sleep apnea) 03/09/2020   Wound dehiscence, traumatic injury repair, left medial ankle  03/08/2020   Vitamin D deficiency 02/16/2020   Chronic, continuous use of opioids 02/15/2020    Open fracture dislocation of left ankle 01/31/2020   Ankle fracture 01/31/2020   Permanent atrial fibrillation ([place])    Hypertension    Diabetes ([place])    Bee sting allergy    Hypotension 12/25/2017   Chronic anticoagulation 03/07/2017   Coronary artery calcification seen on CT scan 02/07/2017   CAD in native artery 02/07/2017   Persistent atrial fibrillation ([place]) 02/06/2017   Anxiety 02/06/2017   Arthritis 02/06/2017   Pernicious anemia 02/06/2017   Allergy to pollen 04/06/2015   OSA treated with BiPAP 12/14/2014   Obesity hypoventilation syndrome ([place]) 12/14/2014   Morbid obesity with BMI of 40.0-44.9, adult ([place]) 12/14/2014   Hypertensive heart disease 12/14/2014   Hyperlipidemia, mixed 12/14/2014   Type 2 diabetes mellitus with diabetic neuropathy ([place]) 12/14/2014   Chronic venous insufficiency 12/14/2014   Edema 12/14/2014   Morbid obesity ([place]) 12/14/2014   Bell's palsy 02/24/2013   Polyneuropathy in diabetes ([place]) 02/24/2013   Syndrome affecting cervical region 02/24/2013   Trigeminal neuralgia 02/24/2013   Lumbar pain with radiation down left leg 07/09/2012   BMI 45.0-49.9, adult ([place]) 07/09/2012   Chronic pain syndrome 07/09/2012   Morbid obesity with BMI of 45.0-49.9, adult ([place]) 07/09/2012   Pain syndrome, chronic 07/09/2012   Past Medical History:  Diagnosis Date   A-fib ([place])    Allergy to pollen 04/06/2015   Anxiety 02/06/2017   Arthritis 02/06/2017   Asthma    as a child   Bee sting allergy    wasp / mixed vespid   Bell's palsy 02/24/2013   Chronic venous insufficiency 12/14/2014   Chronic, continuous use of opioids 02/15/2020   Depression    Diabetes ([place])    Dysrhythmia    afib   Edema 12/14/2014   GERD (gastroesophageal reflux disease)    History of kidney stones    Hyperlipidemia, mixed 12/14/2014   Hypertension    IBS (irritable bowel syndrome)    Lumbar pain with radiation down left leg 07/09/2012   Morbid obesity ([place]) 12/14/2014    Morbid obesity with BMI of 45.0-49.9, adult ([place]) 07/09/2012   Obesity hypoventilation syndrome ([place]) 12/14/2014   OSA (obstructive sleep apnea) 03/09/2020   doesn't use a Cpap   OSA treated with BiPAP 12/14/2014   Pain syndrome, chronic 07/09/2012   Pernicious anemia 02/06/2017   Pneumonia    Polyneuropathy in diabetes ([place]) 02/24/2013   Syndrome affecting cervical region 02/24/2013   Trigeminal neuralgia 02/24/2013   Type 2 diabetes mellitus with diabetic neuropathy ([place]) 12/14/2014   Vitamin D deficiency 02/16/2020   Wound dehiscence, traumatic injury repair, left medial ankle  03/08/2020  Family History  Problem Relation Age of Onset   Breast cancer Mother    Heart disease Father    CAD Father    Atrial fibrillation Father    Stomach cancer Paternal Aunt    Heart disease Paternal Uncle    Breast cancer Maternal Grandmother     Past Surgical History:  Procedure Laterality Date    INTERNAL FIXATION LEFT TIBIA TO CALCANEUS AND APPLY SKIN GRAFT (Left Ankle)  04/05/2020   ADENOIDECTOMY     AMPUTATION Left 10/04/2020   Procedure: LEFT BELOW KNEE AMPUTATION;  Surgeon: [name], MD;  Location: [facility];  Service: Orthopedics;  Laterality: Left;   BACK SURGERY  2002 /2003   CARPAL TUNNEL RELEASE Bilateral    I & D EXTREMITY Left 01/31/2020   Procedure: IRRIGATION AND DEBRIDEMENT ANKLE;  Surgeon: Altamese [place], MD;  Location: [facility];  Service: Orthopedics;  Laterality: Left;   I & D EXTREMITY Left 03/10/2020   Procedure: DEBRIDEMENT LEFT ANKLE, APPLY SKIN GRAFT;  Surgeon: [name], MD;  Location: [facility];  Service: Orthopedics;  Laterality: Left;   KNEE SURGERY Left 2005   ORIF ANKLE FRACTURE Left 01/31/2020   ORIF ANKLE FRACTURE Left 01/31/2020   Procedure: OPEN REDUCTION INTERNAL FIXATION (ORIF) ANKLE FRACTURE;  Surgeon: Altamese [place], MD;  Location: [facility];  Service: Orthopedics;  Laterality: Left;   ORIF ANKLE FRACTURE Left 04/05/2020   Procedure: INTERNAL FIXATION LEFT  TIBIA TO CALCANEUS AND APPLY SKIN GRAFT;  Surgeon: [name], MD;  Location: [facility];  Service: Orthopedics;  Laterality: Left;   SHOULDER SURGERY Left 1998   TONSILLECTOMY     Social History   Occupational History   Not on file  Tobacco Use   Smoking status: Former    Packs/day: 3.00    Years: 35.00    Pack years: 105.00    Types: Cigarettes    Quit date: 04/13/2006    Years since quitting: 14.5   Smokeless tobacco: Never  Vaping Use   Vaping Use: Never used  Substance and Sexual Activity   Alcohol use: No    Alcohol/week: 0.0 standard drinks   Drug use: No   Sexual activity: Not on file

## 2020-10-20 ENCOUNTER — Ambulatory Visit: Payer: Medicare HMO | Admitting: Family

## 2020-10-20 DIAGNOSIS — F330 Major depressive disorder, recurrent, mild: Secondary | ICD-10-CM | POA: Diagnosis not present

## 2020-10-20 DIAGNOSIS — R262 Difficulty in walking, not elsewhere classified: Secondary | ICD-10-CM | POA: Diagnosis not present

## 2020-10-20 DIAGNOSIS — Z89512 Acquired absence of left leg below knee: Secondary | ICD-10-CM | POA: Diagnosis not present

## 2020-10-20 DIAGNOSIS — I509 Heart failure, unspecified: Secondary | ICD-10-CM | POA: Diagnosis not present

## 2020-10-21 DIAGNOSIS — E119 Type 2 diabetes mellitus without complications: Secondary | ICD-10-CM | POA: Diagnosis not present

## 2020-10-21 DIAGNOSIS — F419 Anxiety disorder, unspecified: Secondary | ICD-10-CM | POA: Diagnosis not present

## 2020-10-21 DIAGNOSIS — K219 Gastro-esophageal reflux disease without esophagitis: Secondary | ICD-10-CM | POA: Diagnosis not present

## 2020-10-21 DIAGNOSIS — I10 Essential (primary) hypertension: Secondary | ICD-10-CM | POA: Diagnosis not present

## 2020-10-21 DIAGNOSIS — I4891 Unspecified atrial fibrillation: Secondary | ICD-10-CM | POA: Diagnosis not present

## 2020-10-21 DIAGNOSIS — Z4781 Encounter for orthopedic aftercare following surgical amputation: Secondary | ICD-10-CM | POA: Diagnosis not present

## 2020-10-21 DIAGNOSIS — S71102D Unspecified open wound, left thigh, subsequent encounter: Secondary | ICD-10-CM | POA: Diagnosis not present

## 2020-10-21 DIAGNOSIS — J449 Chronic obstructive pulmonary disease, unspecified: Secondary | ICD-10-CM | POA: Diagnosis not present

## 2020-10-21 DIAGNOSIS — F32A Depression, unspecified: Secondary | ICD-10-CM | POA: Diagnosis not present

## 2020-10-22 DIAGNOSIS — K219 Gastro-esophageal reflux disease without esophagitis: Secondary | ICD-10-CM | POA: Diagnosis not present

## 2020-10-22 DIAGNOSIS — Z4781 Encounter for orthopedic aftercare following surgical amputation: Secondary | ICD-10-CM | POA: Diagnosis not present

## 2020-10-22 DIAGNOSIS — Z89512 Acquired absence of left leg below knee: Secondary | ICD-10-CM | POA: Diagnosis not present

## 2020-10-23 ENCOUNTER — Ambulatory Visit: Payer: Medicare HMO

## 2020-10-23 DIAGNOSIS — Z4781 Encounter for orthopedic aftercare following surgical amputation: Secondary | ICD-10-CM | POA: Diagnosis not present

## 2020-10-23 DIAGNOSIS — E119 Type 2 diabetes mellitus without complications: Secondary | ICD-10-CM | POA: Diagnosis not present

## 2020-10-23 DIAGNOSIS — K219 Gastro-esophageal reflux disease without esophagitis: Secondary | ICD-10-CM | POA: Diagnosis not present

## 2020-10-23 DIAGNOSIS — F32A Depression, unspecified: Secondary | ICD-10-CM | POA: Diagnosis not present

## 2020-10-23 DIAGNOSIS — I10 Essential (primary) hypertension: Secondary | ICD-10-CM | POA: Diagnosis not present

## 2020-10-23 DIAGNOSIS — J449 Chronic obstructive pulmonary disease, unspecified: Secondary | ICD-10-CM | POA: Diagnosis not present

## 2020-10-23 DIAGNOSIS — F419 Anxiety disorder, unspecified: Secondary | ICD-10-CM | POA: Diagnosis not present

## 2020-10-23 DIAGNOSIS — I4891 Unspecified atrial fibrillation: Secondary | ICD-10-CM | POA: Diagnosis not present

## 2020-10-23 DIAGNOSIS — S71102D Unspecified open wound, left thigh, subsequent encounter: Secondary | ICD-10-CM | POA: Diagnosis not present

## 2020-10-24 DIAGNOSIS — J449 Chronic obstructive pulmonary disease, unspecified: Secondary | ICD-10-CM | POA: Diagnosis not present

## 2020-10-24 DIAGNOSIS — I4891 Unspecified atrial fibrillation: Secondary | ICD-10-CM | POA: Diagnosis not present

## 2020-10-24 DIAGNOSIS — F32A Depression, unspecified: Secondary | ICD-10-CM | POA: Diagnosis not present

## 2020-10-24 DIAGNOSIS — S71102D Unspecified open wound, left thigh, subsequent encounter: Secondary | ICD-10-CM | POA: Diagnosis not present

## 2020-10-24 DIAGNOSIS — E119 Type 2 diabetes mellitus without complications: Secondary | ICD-10-CM | POA: Diagnosis not present

## 2020-10-24 DIAGNOSIS — K219 Gastro-esophageal reflux disease without esophagitis: Secondary | ICD-10-CM | POA: Diagnosis not present

## 2020-10-24 DIAGNOSIS — F419 Anxiety disorder, unspecified: Secondary | ICD-10-CM | POA: Diagnosis not present

## 2020-10-24 DIAGNOSIS — I10 Essential (primary) hypertension: Secondary | ICD-10-CM | POA: Diagnosis not present

## 2020-10-24 DIAGNOSIS — Z4781 Encounter for orthopedic aftercare following surgical amputation: Secondary | ICD-10-CM | POA: Diagnosis not present

## 2020-10-26 ENCOUNTER — Ambulatory Visit: Payer: Medicare HMO

## 2020-10-26 ENCOUNTER — Ambulatory Visit (INDEPENDENT_AMBULATORY_CARE_PROVIDER_SITE_OTHER): Payer: Medicare HMO | Admitting: *Deleted

## 2020-10-26 DIAGNOSIS — I4891 Unspecified atrial fibrillation: Secondary | ICD-10-CM | POA: Diagnosis not present

## 2020-10-26 DIAGNOSIS — T63441D Toxic effect of venom of bees, accidental (unintentional), subsequent encounter: Secondary | ICD-10-CM | POA: Diagnosis not present

## 2020-10-26 DIAGNOSIS — Z4781 Encounter for orthopedic aftercare following surgical amputation: Secondary | ICD-10-CM | POA: Diagnosis not present

## 2020-10-26 DIAGNOSIS — F419 Anxiety disorder, unspecified: Secondary | ICD-10-CM | POA: Diagnosis not present

## 2020-10-26 DIAGNOSIS — K219 Gastro-esophageal reflux disease without esophagitis: Secondary | ICD-10-CM | POA: Diagnosis not present

## 2020-10-26 DIAGNOSIS — S71102D Unspecified open wound, left thigh, subsequent encounter: Secondary | ICD-10-CM | POA: Diagnosis not present

## 2020-10-26 DIAGNOSIS — F32A Depression, unspecified: Secondary | ICD-10-CM | POA: Diagnosis not present

## 2020-10-26 DIAGNOSIS — I10 Essential (primary) hypertension: Secondary | ICD-10-CM | POA: Diagnosis not present

## 2020-10-26 DIAGNOSIS — E119 Type 2 diabetes mellitus without complications: Secondary | ICD-10-CM | POA: Diagnosis not present

## 2020-10-26 DIAGNOSIS — J449 Chronic obstructive pulmonary disease, unspecified: Secondary | ICD-10-CM | POA: Diagnosis not present

## 2020-10-27 ENCOUNTER — Ambulatory Visit: Payer: Self-pay

## 2020-10-30 DIAGNOSIS — Z4781 Encounter for orthopedic aftercare following surgical amputation: Secondary | ICD-10-CM | POA: Diagnosis not present

## 2020-10-31 DIAGNOSIS — F32A Depression, unspecified: Secondary | ICD-10-CM | POA: Diagnosis not present

## 2020-10-31 DIAGNOSIS — Z79891 Long term (current) use of opiate analgesic: Secondary | ICD-10-CM | POA: Diagnosis not present

## 2020-10-31 DIAGNOSIS — J449 Chronic obstructive pulmonary disease, unspecified: Secondary | ICD-10-CM | POA: Diagnosis not present

## 2020-10-31 DIAGNOSIS — Z1389 Encounter for screening for other disorder: Secondary | ICD-10-CM | POA: Diagnosis not present

## 2020-10-31 DIAGNOSIS — M1612 Unilateral primary osteoarthritis, left hip: Secondary | ICD-10-CM | POA: Diagnosis not present

## 2020-10-31 DIAGNOSIS — M25552 Pain in left hip: Secondary | ICD-10-CM | POA: Diagnosis not present

## 2020-10-31 DIAGNOSIS — Z4781 Encounter for orthopedic aftercare following surgical amputation: Secondary | ICD-10-CM | POA: Diagnosis not present

## 2020-10-31 DIAGNOSIS — S71102D Unspecified open wound, left thigh, subsequent encounter: Secondary | ICD-10-CM | POA: Diagnosis not present

## 2020-10-31 DIAGNOSIS — F419 Anxiety disorder, unspecified: Secondary | ICD-10-CM | POA: Diagnosis not present

## 2020-10-31 DIAGNOSIS — I10 Essential (primary) hypertension: Secondary | ICD-10-CM | POA: Diagnosis not present

## 2020-10-31 DIAGNOSIS — I4891 Unspecified atrial fibrillation: Secondary | ICD-10-CM | POA: Diagnosis not present

## 2020-10-31 DIAGNOSIS — Z79899 Other long term (current) drug therapy: Secondary | ICD-10-CM | POA: Diagnosis not present

## 2020-10-31 DIAGNOSIS — K219 Gastro-esophageal reflux disease without esophagitis: Secondary | ICD-10-CM | POA: Diagnosis not present

## 2020-10-31 DIAGNOSIS — E119 Type 2 diabetes mellitus without complications: Secondary | ICD-10-CM | POA: Diagnosis not present

## 2020-10-31 DIAGNOSIS — G894 Chronic pain syndrome: Secondary | ICD-10-CM | POA: Diagnosis not present

## 2020-10-31 DIAGNOSIS — M5136 Other intervertebral disc degeneration, lumbar region: Secondary | ICD-10-CM | POA: Diagnosis not present

## 2020-10-31 DIAGNOSIS — M47816 Spondylosis without myelopathy or radiculopathy, lumbar region: Secondary | ICD-10-CM | POA: Diagnosis not present

## 2020-11-01 DIAGNOSIS — S71102D Unspecified open wound, left thigh, subsequent encounter: Secondary | ICD-10-CM | POA: Diagnosis not present

## 2020-11-01 DIAGNOSIS — E119 Type 2 diabetes mellitus without complications: Secondary | ICD-10-CM | POA: Diagnosis not present

## 2020-11-01 DIAGNOSIS — K219 Gastro-esophageal reflux disease without esophagitis: Secondary | ICD-10-CM | POA: Diagnosis not present

## 2020-11-01 DIAGNOSIS — J449 Chronic obstructive pulmonary disease, unspecified: Secondary | ICD-10-CM | POA: Diagnosis not present

## 2020-11-01 DIAGNOSIS — I10 Essential (primary) hypertension: Secondary | ICD-10-CM | POA: Diagnosis not present

## 2020-11-01 DIAGNOSIS — Z4781 Encounter for orthopedic aftercare following surgical amputation: Secondary | ICD-10-CM | POA: Diagnosis not present

## 2020-11-01 DIAGNOSIS — F419 Anxiety disorder, unspecified: Secondary | ICD-10-CM | POA: Diagnosis not present

## 2020-11-01 DIAGNOSIS — F32A Depression, unspecified: Secondary | ICD-10-CM | POA: Diagnosis not present

## 2020-11-01 DIAGNOSIS — I4891 Unspecified atrial fibrillation: Secondary | ICD-10-CM | POA: Diagnosis not present

## 2020-11-02 ENCOUNTER — Encounter: Payer: Medicare HMO | Admitting: Orthopedic Surgery

## 2020-11-02 ENCOUNTER — Encounter: Payer: Self-pay | Admitting: Orthopedic Surgery

## 2020-11-02 ENCOUNTER — Ambulatory Visit (INDEPENDENT_AMBULATORY_CARE_PROVIDER_SITE_OTHER): Payer: Medicare HMO | Admitting: Physician Assistant

## 2020-11-02 DIAGNOSIS — Q120 Congenital cataract: Secondary | ICD-10-CM | POA: Diagnosis not present

## 2020-11-02 DIAGNOSIS — E119 Type 2 diabetes mellitus without complications: Secondary | ICD-10-CM | POA: Diagnosis not present

## 2020-11-02 DIAGNOSIS — H524 Presbyopia: Secondary | ICD-10-CM | POA: Diagnosis not present

## 2020-11-02 DIAGNOSIS — H52223 Regular astigmatism, bilateral: Secondary | ICD-10-CM | POA: Diagnosis not present

## 2020-11-02 DIAGNOSIS — H16142 Punctate keratitis, left eye: Secondary | ICD-10-CM | POA: Diagnosis not present

## 2020-11-02 DIAGNOSIS — Z89512 Acquired absence of left leg below knee: Secondary | ICD-10-CM

## 2020-11-02 NOTE — Progress Notes (Signed)
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  Overall he is doing well.  Here for staple removal today he has an appointment with [facility] in 2 weeks.  He states that his shrinker is too tight up around his thigh  Assessment & Plan: Visit Diagnoses: No diagnosis found.  Plan: Will fold shrinker down below knee.  Surgical staples removed today.  Follow-up in 3 weeks.  Would anticipate ordering physical therapy at that time  Follow-Up Instructions: No follow-ups on file.   Ortho Exam  Patient is alert, oriented, no adenopathy, well-dressed, normal affect, normal respiratory effort. Examination demonstrates well apposed wound edges well-healed surgical incision no erythema no cellulitis no drainage no signs of infection  Imaging: No results found. No images are attached to the encounter.  Labs: Lab Results  Component Value Date   HGBA1C 6.0 (H) 03/08/2020   HGBA1C 6.0 (H) 02/04/2020   REPTSTATUS 03/15/2020 FINAL 03/10/2020   GRAMSTAIN  03/10/2020    MODERATE WBC PRESENT, PREDOMINANTLY PMN NO ORGANISMS SEEN    CULT  03/10/2020    RARE ENTEROBACTER CLOACAE CRITICAL RESULT CALLED TO, READ BACK BY AND VERIFIED WITH: RN TGilford Rile [phone] FCP NO ANAEROBES ISOLATED Performed at [facility], [place] [address], [place], [place] [address]    [place] 03/10/2020     Lab Results  Component Value Date   ALBUMIN 2.7 (L) 03/10/2020   ALBUMIN 2.9 (L) 03/08/2020   ALBUMIN 2.9 (L) 02/16/2020    Lab Results  Component Value Date   MG 2.1 02/08/2020   MG 2.0  02/04/2020   MG 1.9 02/03/2020   Lab Results  Component Value Date   VD25OH 19.57 (L) 03/08/2020   VD25OH 16.36 (L) 02/16/2020    No results found for: PREALBUMIN CBC EXTENDED Latest Ref Rng & Units 10/06/2020 10/05/2020 10/04/2020  WBC 4.0 - 10.5 K/uL 11.1(H) 10.6(H) 11.8(H)  RBC 4.22 - 5.81 MIL/uL 4.43 4.50 5.22  HGB 13.0 - 17.0 g/dL 11.2(L) 11.4(L) 13.3  HCT 39.0 - 52.0 % 38.4(L) 38.7(L) 44.4  PLT 150 - 400 K/uL 331 280 327  NEUTROABS 1.7 - 7.7 K/uL - - -  LYMPHSABS 0.7 - 4.0 K/uL - - -     There is no height or weight on file to calculate BMI.  Orders:  No orders of the defined types were placed in this encounter.  No orders of the defined types were placed in this encounter.    Procedures: No procedures performed  Clinical Data: No additional findings.  ROS:  All other systems negative, except as noted in the HPI. Review of Systems  Objective: Vital Signs: There were no vitals taken for this visit.  Specialty Comments:  No specialty comments available.  PMFS History: Patient Active Problem List   Diagnosis Date Noted   Abscess of bursa of left ankle 10/04/2020   Subacute osteomyelitis, left ankle and foot (  [place])    Abscess of ankle    Asthma 07/31/2020   Depression 07/31/2020   Dysrhythmia 07/31/2020   GERD (gastroesophageal reflux disease) 07/31/2020   History of kidney stones 07/31/2020   Pneumonia 07/31/2020   Left ankle pain 04/05/2020   Open dislocation of left ankle    A-fib (HCC)    OSA (obstructive sleep apnea) 03/09/2020   Wound dehiscence, traumatic injury repair, left medial ankle  03/08/2020   Vitamin D deficiency 02/16/2020   Chronic, continuous use of opioids 02/15/2020   Open fracture dislocation of left ankle 01/31/2020   Ankle fracture 01/31/2020   Permanent atrial fibrillation ([place])    Hypertension    Diabetes ([place])    Bee sting allergy    Hypotension 12/25/2017   Chronic anticoagulation 03/07/2017   Coronary artery  calcification seen on CT scan 02/07/2017   CAD in native artery 02/07/2017   Persistent atrial fibrillation ([place]) 02/06/2017   Anxiety 02/06/2017   Arthritis 02/06/2017   Pernicious anemia 02/06/2017   Allergy to pollen 04/06/2015   OSA treated with BiPAP 12/14/2014   Obesity hypoventilation syndrome ([place]) 12/14/2014   Morbid obesity with BMI of 40.0-44.9, adult ([place]) 12/14/2014   Hypertensive heart disease 12/14/2014   Hyperlipidemia, mixed 12/14/2014   Type 2 diabetes mellitus with diabetic neuropathy ([place]) 12/14/2014   Chronic venous insufficiency 12/14/2014   Edema 12/14/2014   Morbid obesity ([place]) 12/14/2014   Bell's palsy 02/24/2013   Polyneuropathy in diabetes ([place]) 02/24/2013   Syndrome affecting cervical region 02/24/2013   Trigeminal neuralgia 02/24/2013   Lumbar pain with radiation down left leg 07/09/2012   BMI 45.0-49.9, adult ([employer] [employer]) 07/09/2012   Chronic pain syndrome 07/09/2012   Morbid obesity with BMI of 45.0-49.9, adult ([place]) 07/09/2012   Pain syndrome, chronic 07/09/2012   Past Medical History:  Diagnosis Date   A-fib ([place])    Allergy to pollen 04/06/2015   Anxiety 02/06/2017   Arthritis 02/06/2017   Asthma    as a child   Bee sting allergy    wasp / mixed vespid   Bell's palsy 02/24/2013   Chronic venous insufficiency 12/14/2014   Chronic, continuous use of opioids 02/15/2020   Depression    Diabetes ([place])    Dysrhythmia    afib   Edema 12/14/2014   GERD (gastroesophageal reflux disease)    History of kidney stones    Hyperlipidemia, mixed 12/14/2014   Hypertension    IBS (irritable bowel syndrome)    Lumbar pain with radiation down left leg 07/09/2012   Morbid obesity ([place]) 12/14/2014   Morbid obesity with BMI of 45.0-49.9, adult ([place]) 07/09/2012   Obesity hypoventilation syndrome ([place]) 12/14/2014   OSA (obstructive sleep apnea) 03/09/2020   doesn't use a Cpap   OSA treated with BiPAP 12/14/2014   Pain syndrome, chronic 07/09/2012    Pernicious anemia 02/06/2017   Pneumonia    Polyneuropathy in diabetes ([place]) 02/24/2013   Syndrome affecting cervical region 02/24/2013   Trigeminal neuralgia 02/24/2013   Type 2 diabetes mellitus with diabetic neuropathy ([place]) 12/14/2014   Vitamin D deficiency 02/16/2020   Wound dehiscence, traumatic injury repair, left medial ankle  03/08/2020    Family History  Problem Relation Age of Onset   Breast cancer Mother    Heart disease Father    CAD Father    Atrial fibrillation Father    Stomach cancer Paternal Aunt    Heart disease Paternal Uncle    Breast cancer Maternal Grandmother     Past  Surgical History:  Procedure Laterality Date    INTERNAL FIXATION LEFT TIBIA TO CALCANEUS AND APPLY SKIN GRAFT (Left Ankle)  04/05/2020   ADENOIDECTOMY     AMPUTATION Left 10/04/2020   Procedure: LEFT BELOW KNEE AMPUTATION;  Surgeon: [name], MD;  Location: [facility];  Service: Orthopedics;  Laterality: Left;   BACK SURGERY  2002 /2003   CARPAL TUNNEL RELEASE Bilateral    I & D EXTREMITY Left 01/31/2020   Procedure: IRRIGATION AND DEBRIDEMENT ANKLE;  Surgeon: Altamese [place], MD;  Location: [facility];  Service: Orthopedics;  Laterality: Left;   I & D EXTREMITY Left 03/10/2020   Procedure: DEBRIDEMENT LEFT ANKLE, APPLY SKIN GRAFT;  Surgeon: [name], MD;  Location: [facility];  Service: Orthopedics;  Laterality: Left;   KNEE SURGERY Left 2005   ORIF ANKLE FRACTURE Left 01/31/2020   ORIF ANKLE FRACTURE Left 01/31/2020   Procedure: OPEN REDUCTION INTERNAL FIXATION (ORIF) ANKLE FRACTURE;  Surgeon: Altamese [place], MD;  Location: [facility];  Service: Orthopedics;  Laterality: Left;   ORIF ANKLE FRACTURE Left 04/05/2020   Procedure: INTERNAL FIXATION LEFT TIBIA TO CALCANEUS AND APPLY SKIN GRAFT;  Surgeon: [name], MD;  Location: [facility];  Service: Orthopedics;  Laterality: Left;   SHOULDER SURGERY Left 1998   TONSILLECTOMY     Social History   Occupational History   Not on file  Tobacco Use    Smoking status: Former    Packs/day: 3.00    Years: 35.00    Pack years: 105.00    Types: Cigarettes    Quit date: 04/13/2006    Years since quitting: 14.5   Smokeless tobacco: Never  Vaping Use   Vaping Use: Never used  Substance and Sexual Activity   Alcohol use: No    Alcohol/week: 0.0 standard drinks   Drug use: No   Sexual activity: Not on file

## 2020-11-03 DIAGNOSIS — Z4781 Encounter for orthopedic aftercare following surgical amputation: Secondary | ICD-10-CM | POA: Diagnosis not present

## 2020-11-03 DIAGNOSIS — S71102D Unspecified open wound, left thigh, subsequent encounter: Secondary | ICD-10-CM | POA: Diagnosis not present

## 2020-11-03 DIAGNOSIS — I4891 Unspecified atrial fibrillation: Secondary | ICD-10-CM | POA: Diagnosis not present

## 2020-11-03 DIAGNOSIS — F32A Depression, unspecified: Secondary | ICD-10-CM | POA: Diagnosis not present

## 2020-11-03 DIAGNOSIS — F419 Anxiety disorder, unspecified: Secondary | ICD-10-CM | POA: Diagnosis not present

## 2020-11-03 DIAGNOSIS — I10 Essential (primary) hypertension: Secondary | ICD-10-CM | POA: Diagnosis not present

## 2020-11-03 DIAGNOSIS — E119 Type 2 diabetes mellitus without complications: Secondary | ICD-10-CM | POA: Diagnosis not present

## 2020-11-03 DIAGNOSIS — K219 Gastro-esophageal reflux disease without esophagitis: Secondary | ICD-10-CM | POA: Diagnosis not present

## 2020-11-03 DIAGNOSIS — J449 Chronic obstructive pulmonary disease, unspecified: Secondary | ICD-10-CM | POA: Diagnosis not present

## 2020-11-06 DIAGNOSIS — Z4781 Encounter for orthopedic aftercare following surgical amputation: Secondary | ICD-10-CM | POA: Diagnosis not present

## 2020-11-06 DIAGNOSIS — J449 Chronic obstructive pulmonary disease, unspecified: Secondary | ICD-10-CM | POA: Diagnosis not present

## 2020-11-06 DIAGNOSIS — F419 Anxiety disorder, unspecified: Secondary | ICD-10-CM | POA: Diagnosis not present

## 2020-11-06 DIAGNOSIS — K219 Gastro-esophageal reflux disease without esophagitis: Secondary | ICD-10-CM | POA: Diagnosis not present

## 2020-11-06 DIAGNOSIS — F32A Depression, unspecified: Secondary | ICD-10-CM | POA: Diagnosis not present

## 2020-11-06 DIAGNOSIS — E119 Type 2 diabetes mellitus without complications: Secondary | ICD-10-CM | POA: Diagnosis not present

## 2020-11-06 DIAGNOSIS — I10 Essential (primary) hypertension: Secondary | ICD-10-CM | POA: Diagnosis not present

## 2020-11-06 DIAGNOSIS — S71102D Unspecified open wound, left thigh, subsequent encounter: Secondary | ICD-10-CM | POA: Diagnosis not present

## 2020-11-06 DIAGNOSIS — I4891 Unspecified atrial fibrillation: Secondary | ICD-10-CM | POA: Diagnosis not present

## 2020-11-09 DIAGNOSIS — I10 Essential (primary) hypertension: Secondary | ICD-10-CM | POA: Diagnosis not present

## 2020-11-09 DIAGNOSIS — E119 Type 2 diabetes mellitus without complications: Secondary | ICD-10-CM | POA: Diagnosis not present

## 2020-11-09 DIAGNOSIS — Z4781 Encounter for orthopedic aftercare following surgical amputation: Secondary | ICD-10-CM | POA: Diagnosis not present

## 2020-11-09 DIAGNOSIS — F32A Depression, unspecified: Secondary | ICD-10-CM | POA: Diagnosis not present

## 2020-11-09 DIAGNOSIS — S71102D Unspecified open wound, left thigh, subsequent encounter: Secondary | ICD-10-CM | POA: Diagnosis not present

## 2020-11-09 DIAGNOSIS — K219 Gastro-esophageal reflux disease without esophagitis: Secondary | ICD-10-CM | POA: Diagnosis not present

## 2020-11-09 DIAGNOSIS — I4891 Unspecified atrial fibrillation: Secondary | ICD-10-CM | POA: Diagnosis not present

## 2020-11-09 DIAGNOSIS — J449 Chronic obstructive pulmonary disease, unspecified: Secondary | ICD-10-CM | POA: Diagnosis not present

## 2020-11-09 DIAGNOSIS — F419 Anxiety disorder, unspecified: Secondary | ICD-10-CM | POA: Diagnosis not present

## 2020-11-13 DIAGNOSIS — E119 Type 2 diabetes mellitus without complications: Secondary | ICD-10-CM | POA: Diagnosis not present

## 2020-11-13 DIAGNOSIS — F419 Anxiety disorder, unspecified: Secondary | ICD-10-CM | POA: Diagnosis not present

## 2020-11-13 DIAGNOSIS — S71102D Unspecified open wound, left thigh, subsequent encounter: Secondary | ICD-10-CM | POA: Diagnosis not present

## 2020-11-13 DIAGNOSIS — I4891 Unspecified atrial fibrillation: Secondary | ICD-10-CM | POA: Diagnosis not present

## 2020-11-13 DIAGNOSIS — J449 Chronic obstructive pulmonary disease, unspecified: Secondary | ICD-10-CM | POA: Diagnosis not present

## 2020-11-13 DIAGNOSIS — F32A Depression, unspecified: Secondary | ICD-10-CM | POA: Diagnosis not present

## 2020-11-13 DIAGNOSIS — Z4781 Encounter for orthopedic aftercare following surgical amputation: Secondary | ICD-10-CM | POA: Diagnosis not present

## 2020-11-13 DIAGNOSIS — I10 Essential (primary) hypertension: Secondary | ICD-10-CM | POA: Diagnosis not present

## 2020-11-13 DIAGNOSIS — K219 Gastro-esophageal reflux disease without esophagitis: Secondary | ICD-10-CM | POA: Diagnosis not present

## 2020-11-15 DIAGNOSIS — F32A Depression, unspecified: Secondary | ICD-10-CM | POA: Diagnosis not present

## 2020-11-15 DIAGNOSIS — I10 Essential (primary) hypertension: Secondary | ICD-10-CM | POA: Diagnosis not present

## 2020-11-15 DIAGNOSIS — K219 Gastro-esophageal reflux disease without esophagitis: Secondary | ICD-10-CM | POA: Diagnosis not present

## 2020-11-15 DIAGNOSIS — I4891 Unspecified atrial fibrillation: Secondary | ICD-10-CM | POA: Diagnosis not present

## 2020-11-15 DIAGNOSIS — E119 Type 2 diabetes mellitus without complications: Secondary | ICD-10-CM | POA: Diagnosis not present

## 2020-11-15 DIAGNOSIS — Z4781 Encounter for orthopedic aftercare following surgical amputation: Secondary | ICD-10-CM | POA: Diagnosis not present

## 2020-11-15 DIAGNOSIS — J449 Chronic obstructive pulmonary disease, unspecified: Secondary | ICD-10-CM | POA: Diagnosis not present

## 2020-11-15 DIAGNOSIS — F419 Anxiety disorder, unspecified: Secondary | ICD-10-CM | POA: Diagnosis not present

## 2020-11-15 DIAGNOSIS — S71102D Unspecified open wound, left thigh, subsequent encounter: Secondary | ICD-10-CM | POA: Diagnosis not present

## 2020-11-21 DIAGNOSIS — Z4781 Encounter for orthopedic aftercare following surgical amputation: Secondary | ICD-10-CM | POA: Diagnosis not present

## 2020-11-21 DIAGNOSIS — Z89512 Acquired absence of left leg below knee: Secondary | ICD-10-CM | POA: Diagnosis not present

## 2020-11-21 DIAGNOSIS — S71102D Unspecified open wound, left thigh, subsequent encounter: Secondary | ICD-10-CM | POA: Diagnosis not present

## 2020-11-21 DIAGNOSIS — F419 Anxiety disorder, unspecified: Secondary | ICD-10-CM | POA: Diagnosis not present

## 2020-11-21 DIAGNOSIS — I4891 Unspecified atrial fibrillation: Secondary | ICD-10-CM | POA: Diagnosis not present

## 2020-11-21 DIAGNOSIS — F32A Depression, unspecified: Secondary | ICD-10-CM | POA: Diagnosis not present

## 2020-11-21 DIAGNOSIS — E119 Type 2 diabetes mellitus without complications: Secondary | ICD-10-CM | POA: Diagnosis not present

## 2020-11-21 DIAGNOSIS — I10 Essential (primary) hypertension: Secondary | ICD-10-CM | POA: Diagnosis not present

## 2020-11-21 DIAGNOSIS — K219 Gastro-esophageal reflux disease without esophagitis: Secondary | ICD-10-CM | POA: Diagnosis not present

## 2020-11-21 DIAGNOSIS — J449 Chronic obstructive pulmonary disease, unspecified: Secondary | ICD-10-CM | POA: Diagnosis not present

## 2020-11-22 DIAGNOSIS — Z4781 Encounter for orthopedic aftercare following surgical amputation: Secondary | ICD-10-CM | POA: Diagnosis not present

## 2020-11-22 DIAGNOSIS — I4891 Unspecified atrial fibrillation: Secondary | ICD-10-CM | POA: Diagnosis not present

## 2020-11-22 DIAGNOSIS — F32A Depression, unspecified: Secondary | ICD-10-CM | POA: Diagnosis not present

## 2020-11-22 DIAGNOSIS — Z79899 Other long term (current) drug therapy: Secondary | ICD-10-CM | POA: Diagnosis not present

## 2020-11-22 DIAGNOSIS — M1612 Unilateral primary osteoarthritis, left hip: Secondary | ICD-10-CM | POA: Diagnosis not present

## 2020-11-22 DIAGNOSIS — E119 Type 2 diabetes mellitus without complications: Secondary | ICD-10-CM | POA: Diagnosis not present

## 2020-11-22 DIAGNOSIS — K219 Gastro-esophageal reflux disease without esophagitis: Secondary | ICD-10-CM | POA: Diagnosis not present

## 2020-11-22 DIAGNOSIS — G894 Chronic pain syndrome: Secondary | ICD-10-CM | POA: Diagnosis not present

## 2020-11-22 DIAGNOSIS — M5136 Other intervertebral disc degeneration, lumbar region: Secondary | ICD-10-CM | POA: Diagnosis not present

## 2020-11-22 DIAGNOSIS — J449 Chronic obstructive pulmonary disease, unspecified: Secondary | ICD-10-CM | POA: Diagnosis not present

## 2020-11-22 DIAGNOSIS — F419 Anxiety disorder, unspecified: Secondary | ICD-10-CM | POA: Diagnosis not present

## 2020-11-22 DIAGNOSIS — M25552 Pain in left hip: Secondary | ICD-10-CM | POA: Diagnosis not present

## 2020-11-22 DIAGNOSIS — I10 Essential (primary) hypertension: Secondary | ICD-10-CM | POA: Diagnosis not present

## 2020-11-22 DIAGNOSIS — Z79891 Long term (current) use of opiate analgesic: Secondary | ICD-10-CM | POA: Diagnosis not present

## 2020-11-22 DIAGNOSIS — M47816 Spondylosis without myelopathy or radiculopathy, lumbar region: Secondary | ICD-10-CM | POA: Diagnosis not present

## 2020-11-22 DIAGNOSIS — S71102D Unspecified open wound, left thigh, subsequent encounter: Secondary | ICD-10-CM | POA: Diagnosis not present

## 2020-11-22 DIAGNOSIS — Z1389 Encounter for screening for other disorder: Secondary | ICD-10-CM | POA: Diagnosis not present

## 2020-11-23 ENCOUNTER — Ambulatory Visit (INDEPENDENT_AMBULATORY_CARE_PROVIDER_SITE_OTHER): Payer: Medicare HMO | Admitting: Orthopedic Surgery

## 2020-11-23 ENCOUNTER — Encounter: Payer: Medicare HMO | Admitting: Orthopedic Surgery

## 2020-11-23 DIAGNOSIS — Z89512 Acquired absence of left leg below knee: Secondary | ICD-10-CM

## 2020-11-27 ENCOUNTER — Encounter: Payer: Self-pay | Admitting: Orthopedic Surgery

## 2020-11-27 NOTE — Progress Notes (Signed)
Office Visit Note   Patient: David Fitzgerald           Date of Birth: [birth year]           MRN: [id number] Visit Date: 11/23/2020              Requested by: Angelina Sheriff, MD [address] [place],  [place] [address] PCP: Angelina Sheriff, MD  Chief Complaint  Patient presents with   Left Knee - Routine Post Op      HPI: Patient presents in follow-up for left transtibial amputation patient states he is doing well.  Assessment & Plan: Visit Diagnoses:  1. Hx of BKA, left ([place])     Plan: Patient is scheduled for a follow-up with biotech for prosthetic fitting.  Prescription was provided.  Follow-Up Instructions: Return in about 4 weeks (around 12/21/2020).   Ortho Exam  Patient is alert, oriented, no adenopathy, well-dressed, normal affect, normal respiratory effort. Examination the residual limb is well-healed he is wearing a 3 XL shrinker that is loose recommend that he get a 2 XL shrinker.  Patient is a new left transtibial  amputee.  Patient's current comorbidities are not expected to impact the ability to function with the prescribed prosthesis. Patient verbally communicates a strong desire to use a prosthesis. Patient currently requires mobility aids to ambulate without a prosthesis.  Expects not to use mobility aids with a new prosthesis.  Patient is a K3 level ambulator that spends a lot of time walking around on uneven terrain over obstacles, up and down stairs, and ambulates with a variable cadence.     Imaging: No results found. No images are attached to the encounter.  Labs: Lab Results  Component Value Date   HGBA1C 6.0 (H) 03/08/2020   HGBA1C 6.0 (H) 02/04/2020   REPTSTATUS 03/15/2020 FINAL 03/10/2020   GRAMSTAIN  03/10/2020    MODERATE WBC PRESENT, PREDOMINANTLY PMN NO ORGANISMS SEEN    CULT  03/10/2020    RARE ENTEROBACTER CLOACAE CRITICAL RESULT CALLED TO, READ BACK BY AND VERIFIED WITH: RN TGilford Rile [phone] FCP NO ANAEROBES  ISOLATED Performed at [facility], [place] [address], [place], [place] [address]    [place] 03/10/2020     Lab Results  Component Value Date   ALBUMIN 2.7 (L) 03/10/2020   ALBUMIN 2.9 (L) 03/08/2020   ALBUMIN 2.9 (L) 02/16/2020    Lab Results  Component Value Date   MG 2.1 02/08/2020   MG 2.0 02/04/2020   MG 1.9 02/03/2020   Lab Results  Component Value Date   VD25OH 19.57 (L) 03/08/2020   VD25OH 16.36 (L) 02/16/2020    No results found for: PREALBUMIN CBC EXTENDED Latest Ref Rng & Units 10/06/2020 10/05/2020 10/04/2020  WBC 4.0 - 10.5 K/uL 11.1(H) 10.6(H) 11.8(H)  RBC 4.22 - 5.81 MIL/uL 4.43 4.50 5.22  HGB 13.0 - 17.0 g/dL 11.2(L) 11.4(L) 13.3  HCT 39.0 - 52.0 % 38.4(L) 38.7(L) 44.4  PLT 150 - 400 K/uL 331 280 327  NEUTROABS 1.7 - 7.7 K/uL - - -  LYMPHSABS 0.7 - 4.0 K/uL - - -     There is no height or weight on file to calculate BMI.  Orders:  No orders of the defined types were placed in this encounter.  No orders of the defined types were placed in this encounter.    Procedures: No procedures performed  Clinical Data: No additional findings.  ROS:  All other  systems negative, except as noted in the HPI. Review of Systems  Objective: Vital Signs: There were no vitals taken for this visit.  Specialty Comments:  No specialty comments available.  PMFS History: Patient Active Problem List   Diagnosis Date Noted   Abscess of bursa of left ankle 10/04/2020   Subacute osteomyelitis, left ankle and foot (HCC)    Abscess of ankle    Asthma 07/31/2020   Depression 07/31/2020   Dysrhythmia 07/31/2020   GERD (gastroesophageal reflux disease) 07/31/2020   History of kidney stones 07/31/2020   Pneumonia 07/31/2020   Left ankle pain 04/05/2020   Open dislocation of left ankle    A-fib (HCC)    OSA (obstructive sleep apnea) 03/09/2020   Wound dehiscence, traumatic injury repair, left medial ankle  03/08/2020   Vitamin D deficiency  02/16/2020   Chronic, continuous use of opioids 02/15/2020   Open fracture dislocation of left ankle 01/31/2020   Ankle fracture 01/31/2020   Permanent atrial fibrillation ([place])    Hypertension    Diabetes ([place])    Bee sting allergy    Hypotension 12/25/2017   Chronic anticoagulation 03/07/2017   Coronary artery calcification seen on CT scan 02/07/2017   CAD in native artery 02/07/2017   Persistent atrial fibrillation ([place]) 02/06/2017   Anxiety 02/06/2017   Arthritis 02/06/2017   Pernicious anemia 02/06/2017   Allergy to pollen 04/06/2015   OSA treated with BiPAP 12/14/2014   Obesity hypoventilation syndrome ([place]) 12/14/2014   Morbid obesity with BMI of 40.0-44.9, adult ([place]) 12/14/2014   Hypertensive heart disease 12/14/2014   Hyperlipidemia, mixed 12/14/2014   Type 2 diabetes mellitus with diabetic neuropathy ([place]) 12/14/2014   Chronic venous insufficiency 12/14/2014   Edema 12/14/2014   Morbid obesity ([place]) 12/14/2014   Bell's palsy 02/24/2013   Polyneuropathy in diabetes ([place]) 02/24/2013   Syndrome affecting cervical region 02/24/2013   Trigeminal neuralgia 02/24/2013   Lumbar pain with radiation down left leg 07/09/2012   BMI 45.0-49.9, adult ([place]) 07/09/2012   Chronic pain syndrome 07/09/2012   Morbid obesity with BMI of 45.0-49.9, adult ([place]) 07/09/2012   Pain syndrome, chronic 07/09/2012   Past Medical History:  Diagnosis Date   A-fib ([place])    Allergy to pollen 04/06/2015   Anxiety 02/06/2017   Arthritis 02/06/2017   Asthma    as a child   Bee sting allergy    wasp / mixed vespid   Bell's palsy 02/24/2013   Chronic venous insufficiency 12/14/2014   Chronic, continuous use of opioids 02/15/2020   Depression    Diabetes ([place])    Dysrhythmia    afib   Edema 12/14/2014   GERD (gastroesophageal reflux disease)    History of kidney stones    Hyperlipidemia, mixed 12/14/2014   Hypertension    IBS (irritable bowel syndrome)    Lumbar pain with radiation down  left leg 07/09/2012   Morbid obesity ([place]) 12/14/2014   Morbid obesity with BMI of 45.0-49.9, adult ([place]) 07/09/2012   Obesity hypoventilation syndrome ([place]) 12/14/2014   OSA (obstructive sleep apnea) 03/09/2020   doesn't use a Cpap   OSA treated with BiPAP 12/14/2014   Pain syndrome, chronic 07/09/2012   Pernicious anemia 02/06/2017   Pneumonia    Polyneuropathy in diabetes ([place]) 02/24/2013   Syndrome affecting cervical region 02/24/2013   Trigeminal neuralgia 02/24/2013   Type 2 diabetes mellitus with diabetic neuropathy ([place]) 12/14/2014   Vitamin D deficiency 02/16/2020   Wound dehiscence, traumatic injury repair, left medial ankle  03/08/2020    Family History  Problem Relation Age of Onset   Breast cancer Mother    Heart disease Father    CAD Father    Atrial fibrillation Father    Stomach cancer Paternal Aunt    Heart disease Paternal Uncle    Breast cancer Maternal Grandmother     Past Surgical History:  Procedure Laterality Date    INTERNAL FIXATION LEFT TIBIA TO CALCANEUS AND APPLY SKIN GRAFT (Left Ankle)  04/05/2020   ADENOIDECTOMY     AMPUTATION Left 10/04/2020   Procedure: LEFT BELOW KNEE AMPUTATION;  Surgeon: [name], MD;  Location: [facility];  Service: Orthopedics;  Laterality: Left;   BACK SURGERY  2002 /2003   CARPAL TUNNEL RELEASE Bilateral    I & D EXTREMITY Left 01/31/2020   Procedure: IRRIGATION AND DEBRIDEMENT ANKLE;  Surgeon: Altamese Allen, MD;  Location: [facility];  Service: Orthopedics;  Laterality: Left;   I & D EXTREMITY Left 03/10/2020   Procedure: DEBRIDEMENT LEFT ANKLE, APPLY SKIN GRAFT;  Surgeon: [name], MD;  Location: [facility];  Service: Orthopedics;  Laterality: Left;   KNEE SURGERY Left 2005   ORIF ANKLE FRACTURE Left 01/31/2020   ORIF ANKLE FRACTURE Left 01/31/2020   Procedure: OPEN REDUCTION INTERNAL FIXATION (ORIF) ANKLE FRACTURE;  Surgeon: Altamese Early, MD;  Location: [facility];  Service: Orthopedics;  Laterality: Left;   ORIF ANKLE  FRACTURE Left 04/05/2020   Procedure: INTERNAL FIXATION LEFT TIBIA TO CALCANEUS AND APPLY SKIN GRAFT;  Surgeon: [name], MD;  Location: [facility];  Service: Orthopedics;  Laterality: Left;   SHOULDER SURGERY Left 1998   TONSILLECTOMY     Social History   Occupational History   Not on file  Tobacco Use   Smoking status: Former    Packs/day: 3.00    Years: 35.00    Pack years: 105.00    Types: Cigarettes    Quit date: 04/13/2006    Years since quitting: 14.6   Smokeless tobacco: Never  Vaping Use   Vaping Use: Never used  Substance and Sexual Activity   Alcohol use: No    Alcohol/week: 0.0 standard drinks   Drug use: No   Sexual activity: Not on file

## 2020-11-28 DIAGNOSIS — J449 Chronic obstructive pulmonary disease, unspecified: Secondary | ICD-10-CM | POA: Diagnosis not present

## 2020-11-28 DIAGNOSIS — Z4781 Encounter for orthopedic aftercare following surgical amputation: Secondary | ICD-10-CM | POA: Diagnosis not present

## 2020-11-28 DIAGNOSIS — F32A Depression, unspecified: Secondary | ICD-10-CM | POA: Diagnosis not present

## 2020-11-28 DIAGNOSIS — Z79899 Other long term (current) drug therapy: Secondary | ICD-10-CM | POA: Diagnosis not present

## 2020-11-28 DIAGNOSIS — I4891 Unspecified atrial fibrillation: Secondary | ICD-10-CM | POA: Diagnosis not present

## 2020-11-28 DIAGNOSIS — E119 Type 2 diabetes mellitus without complications: Secondary | ICD-10-CM | POA: Diagnosis not present

## 2020-11-28 DIAGNOSIS — E7800 Pure hypercholesterolemia, unspecified: Secondary | ICD-10-CM | POA: Diagnosis not present

## 2020-11-28 DIAGNOSIS — K219 Gastro-esophageal reflux disease without esophagitis: Secondary | ICD-10-CM | POA: Diagnosis not present

## 2020-11-28 DIAGNOSIS — Z23 Encounter for immunization: Secondary | ICD-10-CM | POA: Diagnosis not present

## 2020-11-28 DIAGNOSIS — I10 Essential (primary) hypertension: Secondary | ICD-10-CM | POA: Diagnosis not present

## 2020-11-28 DIAGNOSIS — Z0000 Encounter for general adult medical examination without abnormal findings: Secondary | ICD-10-CM | POA: Diagnosis not present

## 2020-11-28 DIAGNOSIS — Z6841 Body Mass Index (BMI) 40.0 and over, adult: Secondary | ICD-10-CM | POA: Diagnosis not present

## 2020-11-28 DIAGNOSIS — F419 Anxiety disorder, unspecified: Secondary | ICD-10-CM | POA: Diagnosis not present

## 2020-11-28 DIAGNOSIS — Z125 Encounter for screening for malignant neoplasm of prostate: Secondary | ICD-10-CM | POA: Diagnosis not present

## 2020-11-28 DIAGNOSIS — S71102D Unspecified open wound, left thigh, subsequent encounter: Secondary | ICD-10-CM | POA: Diagnosis not present

## 2020-12-06 DIAGNOSIS — I10 Essential (primary) hypertension: Secondary | ICD-10-CM | POA: Diagnosis not present

## 2020-12-06 DIAGNOSIS — F419 Anxiety disorder, unspecified: Secondary | ICD-10-CM | POA: Diagnosis not present

## 2020-12-06 DIAGNOSIS — S71102D Unspecified open wound, left thigh, subsequent encounter: Secondary | ICD-10-CM | POA: Diagnosis not present

## 2020-12-06 DIAGNOSIS — J449 Chronic obstructive pulmonary disease, unspecified: Secondary | ICD-10-CM | POA: Diagnosis not present

## 2020-12-06 DIAGNOSIS — F32A Depression, unspecified: Secondary | ICD-10-CM | POA: Diagnosis not present

## 2020-12-06 DIAGNOSIS — Z4781 Encounter for orthopedic aftercare following surgical amputation: Secondary | ICD-10-CM | POA: Diagnosis not present

## 2020-12-06 DIAGNOSIS — K219 Gastro-esophageal reflux disease without esophagitis: Secondary | ICD-10-CM | POA: Diagnosis not present

## 2020-12-06 DIAGNOSIS — I4891 Unspecified atrial fibrillation: Secondary | ICD-10-CM | POA: Diagnosis not present

## 2020-12-06 DIAGNOSIS — E119 Type 2 diabetes mellitus without complications: Secondary | ICD-10-CM | POA: Diagnosis not present

## 2020-12-07 DIAGNOSIS — S71102D Unspecified open wound, left thigh, subsequent encounter: Secondary | ICD-10-CM | POA: Diagnosis not present

## 2020-12-07 DIAGNOSIS — I10 Essential (primary) hypertension: Secondary | ICD-10-CM | POA: Diagnosis not present

## 2020-12-07 DIAGNOSIS — K219 Gastro-esophageal reflux disease without esophagitis: Secondary | ICD-10-CM | POA: Diagnosis not present

## 2020-12-07 DIAGNOSIS — I4891 Unspecified atrial fibrillation: Secondary | ICD-10-CM | POA: Diagnosis not present

## 2020-12-07 DIAGNOSIS — E119 Type 2 diabetes mellitus without complications: Secondary | ICD-10-CM | POA: Diagnosis not present

## 2020-12-07 DIAGNOSIS — Z4781 Encounter for orthopedic aftercare following surgical amputation: Secondary | ICD-10-CM | POA: Diagnosis not present

## 2020-12-07 DIAGNOSIS — J449 Chronic obstructive pulmonary disease, unspecified: Secondary | ICD-10-CM | POA: Diagnosis not present

## 2020-12-07 DIAGNOSIS — F419 Anxiety disorder, unspecified: Secondary | ICD-10-CM | POA: Diagnosis not present

## 2020-12-07 DIAGNOSIS — F32A Depression, unspecified: Secondary | ICD-10-CM | POA: Diagnosis not present

## 2020-12-08 ENCOUNTER — Other Ambulatory Visit: Payer: Self-pay

## 2020-12-08 DIAGNOSIS — K589 Irritable bowel syndrome without diarrhea: Secondary | ICD-10-CM | POA: Insufficient documentation

## 2020-12-08 DIAGNOSIS — R1013 Epigastric pain: Secondary | ICD-10-CM | POA: Insufficient documentation

## 2020-12-08 DIAGNOSIS — R0609 Other forms of dyspnea: Secondary | ICD-10-CM | POA: Insufficient documentation

## 2020-12-08 DIAGNOSIS — I4891 Unspecified atrial fibrillation: Secondary | ICD-10-CM

## 2020-12-08 DIAGNOSIS — R112 Nausea with vomiting, unspecified: Secondary | ICD-10-CM

## 2020-12-08 DIAGNOSIS — R319 Hematuria, unspecified: Secondary | ICD-10-CM

## 2020-12-08 DIAGNOSIS — IMO0002 Reserved for concepts with insufficient information to code with codable children: Secondary | ICD-10-CM | POA: Insufficient documentation

## 2020-12-08 HISTORY — DX: Hematuria, unspecified: R31.9

## 2020-12-08 HISTORY — DX: Epigastric pain: R10.13

## 2020-12-08 HISTORY — DX: Other forms of dyspnea: R06.09

## 2020-12-08 HISTORY — DX: Unspecified atrial fibrillation: I48.91

## 2020-12-08 HISTORY — DX: Nausea with vomiting, unspecified: R11.2

## 2020-12-08 HISTORY — DX: Reserved for concepts with insufficient information to code with codable children: IMO0002

## 2020-12-12 ENCOUNTER — Ambulatory Visit (INDEPENDENT_AMBULATORY_CARE_PROVIDER_SITE_OTHER): Payer: Medicare HMO | Admitting: Cardiology

## 2020-12-12 ENCOUNTER — Encounter: Payer: Self-pay | Admitting: Cardiology

## 2020-12-12 ENCOUNTER — Other Ambulatory Visit: Payer: Self-pay

## 2020-12-12 VITALS — BP 154/80 | HR 79 | Ht 74.0 in | Wt 360.0 lb

## 2020-12-12 DIAGNOSIS — J449 Chronic obstructive pulmonary disease, unspecified: Secondary | ICD-10-CM | POA: Diagnosis not present

## 2020-12-12 DIAGNOSIS — I10 Essential (primary) hypertension: Secondary | ICD-10-CM | POA: Diagnosis not present

## 2020-12-12 DIAGNOSIS — I119 Hypertensive heart disease without heart failure: Secondary | ICD-10-CM

## 2020-12-12 DIAGNOSIS — Z4781 Encounter for orthopedic aftercare following surgical amputation: Secondary | ICD-10-CM | POA: Diagnosis not present

## 2020-12-12 DIAGNOSIS — F419 Anxiety disorder, unspecified: Secondary | ICD-10-CM | POA: Diagnosis not present

## 2020-12-12 DIAGNOSIS — S71102D Unspecified open wound, left thigh, subsequent encounter: Secondary | ICD-10-CM | POA: Diagnosis not present

## 2020-12-12 DIAGNOSIS — F32A Depression, unspecified: Secondary | ICD-10-CM | POA: Diagnosis not present

## 2020-12-12 DIAGNOSIS — Z7901 Long term (current) use of anticoagulants: Secondary | ICD-10-CM | POA: Diagnosis not present

## 2020-12-12 DIAGNOSIS — I4891 Unspecified atrial fibrillation: Secondary | ICD-10-CM | POA: Diagnosis not present

## 2020-12-12 DIAGNOSIS — I4819 Other persistent atrial fibrillation: Secondary | ICD-10-CM | POA: Diagnosis not present

## 2020-12-12 DIAGNOSIS — K219 Gastro-esophageal reflux disease without esophagitis: Secondary | ICD-10-CM | POA: Diagnosis not present

## 2020-12-12 DIAGNOSIS — E119 Type 2 diabetes mellitus without complications: Secondary | ICD-10-CM | POA: Diagnosis not present

## 2020-12-12 NOTE — Patient Instructions (Signed)

## 2020-12-12 NOTE — Progress Notes (Signed)
Cardiology Office Note:    Date:  12/12/2020   ID:  David Fitzgerald, DOB 14-Nov-1960, MRN [id number]  PCP:  Angelina Sheriff, MD  Cardiologist:  Shirlee More, MD    Referring MD: Angelina Sheriff, MD    ASSESSMENT:    1. Hypertensive heart disease without heart failure   2. Persistent atrial fibrillation ([place])   3. Chronic anticoagulation    PLAN:    In order of problems listed above:  He is improved heart failure is compensated continue his current loop diuretic along with beta-blocker. Stable rate controlled continue beta-blocker and anticoagulant   Next appointment: 6 months   Medication Adjustments/Labs and Tests Ordered: Current medicines are reviewed at length with the patient today.  Concerns regarding medicines are outlined above.  Orders Placed This Encounter  Procedures   EKG 12-Lead   No orders of the defined types were placed in this encounter.   Chief Complaint  Patient presents with   Follow-up   Atrial Fibrillation    History of Present Illness:    David Fitzgerald is a 60 y.o. male with a hx of permanent atrial fibrillation with chronic anticoagulation and hypertensive heart disease with heart failure last seen 09/11/2020.  His echocardiogram 08/18/2020 showed an ejection fraction 40 to 45% with moderate concentric LVH his proBNP level was elevated was placed on a loop diuretic and Entresto SGLT2 inhibitor and was improved and compensated at his last visit.  Compliance with diet, lifestyle and medications: Yes  He is encouraged he is to start using his below-knee prosthesis later this week He does not weigh at home and has no edema shortness of breath chest pain palpitation or syncope no bleeding from his anticoagulant tolerates his statin without muscle pain or weakness. Past Medical History:  Diagnosis Date   A-fib [facility])    Abscess of ankle    Abscess of bursa of left ankle 10/04/2020   Allergy to pollen 04/06/2015   Ankle fracture  01/31/2020   Anxiety 02/06/2017   Arthritis 02/06/2017   Asthma    as a child   Atrial fibrillation with rapid ventricular response ([place]) 12/08/2020   Bee sting allergy    wasp / mixed vespid   Bell's palsy 02/24/2013   BMI 45.0-49.9, adult ([place]) 07/09/2012   CAD in native artery 02/07/2017   Chronic anticoagulation 03/07/2017   Chronic pain syndrome 07/09/2012   Chronic venous insufficiency 12/14/2014   Chronic, continuous use of opioids 02/15/2020   Coronary artery calcification seen on CT scan 02/07/2017   Depression    Diabetes ([place])    Dyspnea on minimal exertion 12/08/2020   Dysrhythmia    afib   Edema 12/14/2014   Epigastric pain 12/08/2020   GERD (gastroesophageal reflux disease)    Hematuria 12/08/2020   History of kidney stones    Hyperlipidemia, mixed 12/14/2014   Hypertension    Hypertensive heart disease 12/14/2014   Hypotension 12/25/2017   IBS (irritable bowel syndrome)    Left ankle pain 04/05/2020   Lumbar pain with radiation down left leg 07/09/2012   Morbid obesity ([place]) 12/14/2014   Morbid obesity with BMI of 40.0-44.9, adult ([place]) 12/14/2014   Morbid obesity with BMI of 45.0-49.9, adult ([place]) 07/09/2012   Nausea & vomiting 12/08/2020   Obesity hypoventilation syndrome ([place]) 12/14/2014   Open dislocation of left ankle    Open fracture dislocation of left ankle 01/31/2020   OSA (obstructive sleep apnea) 03/09/2020   doesn't use a Cpap  OSA treated with BiPAP 12/14/2014   Pain syndrome, chronic 07/09/2012   Permanent atrial fibrillation (HCC)    Pernicious anemia 02/06/2017   Persistent atrial fibrillation ([place]) 02/06/2017   CHADS2vasc of 2   Pneumonia    Polyneuropathy in diabetes ([place]) 02/24/2013   Subacute osteomyelitis, left ankle and foot ([place])    Syndrome affecting cervical region 02/24/2013   Trigeminal neuralgia 02/24/2013   Type 2 diabetes mellitus with diabetic neuropathy ([place]) 12/14/2014   Type II diabetes mellitus, uncontrolled 12/08/2020   Vitamin D  deficiency 02/16/2020   Wound dehiscence, traumatic injury repair, left medial ankle  03/08/2020    Past Surgical History:  Procedure Laterality Date    INTERNAL FIXATION LEFT TIBIA TO CALCANEUS AND APPLY SKIN GRAFT (Left Ankle)  04/05/2020   ADENOIDECTOMY     AMPUTATION Left 10/04/2020   Procedure: LEFT BELOW KNEE AMPUTATION;  Surgeon: [name], MD;  Location: [facility];  Service: Orthopedics;  Laterality: Left;   BACK SURGERY  2002 /2003   CARPAL TUNNEL RELEASE Bilateral    I & D EXTREMITY Left 01/31/2020   Procedure: IRRIGATION AND DEBRIDEMENT ANKLE;  Surgeon: Altamese [place], MD;  Location: [facility];  Service: Orthopedics;  Laterality: Left;   I & D EXTREMITY Left 03/10/2020   Procedure: DEBRIDEMENT LEFT ANKLE, APPLY SKIN GRAFT;  Surgeon: [name], MD;  Location: [facility];  Service: Orthopedics;  Laterality: Left;   KNEE SURGERY Left 2005   ORIF ANKLE FRACTURE Left 01/31/2020   ORIF ANKLE FRACTURE Left 01/31/2020   Procedure: OPEN REDUCTION INTERNAL FIXATION (ORIF) ANKLE FRACTURE;  Surgeon: Altamese [place], MD;  Location: [facility];  Service: Orthopedics;  Laterality: Left;   ORIF ANKLE FRACTURE Left 04/05/2020   Procedure: INTERNAL FIXATION LEFT TIBIA TO CALCANEUS AND APPLY SKIN GRAFT;  Surgeon: [name], MD;  Location: [facility];  Service: Orthopedics;  Laterality: Left;   SHOULDER SURGERY Left 1998   TONSILLECTOMY      Current Medications: Current Meds  Medication Sig   ascorbic acid (VITAMIN C) 1000 MG tablet Take 1,000 mg by mouth daily.   Cholecalciferol (VITAMIN D3) 125 MCG (5000 UT) TABS Take 5,000 Units by mouth daily.   diazepam (VALIUM) 5 MG tablet Take 5 mg by mouth every 6 (six) hours as needed for anxiety.   dicyclomine (BENTYL) 20 MG tablet Take 20 mg by mouth at bedtime.   DULoxetine (CYMBALTA) 30 MG capsule Take 30 mg by mouth daily.   DULoxetine (CYMBALTA) 60 MG capsule Take 60 mg by mouth daily.    ELIQUIS 5 MG TABS tablet TAKE 1 TABLET BY MOUTH TWICE DAILY    EPINEPHrine 0.3 mg/0.3 mL IJ SOAJ injection Inject 0.3 mg into the muscle as needed for anaphylaxis.   feeding supplement, GLUCERNA SHAKE, (GLUCERNA SHAKE) LIQD Take 237 mLs by mouth 2 (two) times daily between meals.   furosemide (LASIX) 40 MG tablet Take 40 mg by mouth daily.   gabapentin (NEURONTIN) 600 MG tablet Take 600 mg by mouth 3 (three) times daily.   metFORMIN (GLUCOPHAGE) 500 MG tablet Take 500 mg by mouth daily with breakfast.   metoprolol tartrate (LOPRESSOR) 50 MG tablet TAKE 1 TABLET BY MOUTH THREE TIMES DAILY   naloxone (NARCAN) nasal spray 4 mg/0.1 mL Place 1 spray into the nose as needed for opioid reversal.   nitroGLYCERIN (NITROSTAT) 0.4 MG SL tablet Place 1 tablet (0.4 mg total) under the tongue every 5 (five) minutes as needed for chest pain.   oxyCODONE  10 MG TABS Take 1-1.5 tablets (10-15 mg total) by mouth every 4 (four) hours as needed for severe pain (pain score 7-10).   oxyCODONE-acetaminophen (PERCOCET) 7.5-325 MG tablet Take 1 tablet by mouth as needed for pain.   pantoprazole (PROTONIX) 40 MG tablet Take 40 mg by mouth daily.   sacubitril-valsartan (ENTRESTO) 24-26 MG Take 1 tablet by mouth 2 (two) times daily.   simvastatin (ZOCOR) 20 MG tablet Take 20 mg by mouth at bedtime.   Turmeric 500 MG CAPS Take 500 mg by mouth daily.   vitamin B-12 1000 MCG tablet Take 1 tablet (1,000 mcg total) by mouth daily.   zinc sulfate 220 (50 Zn) MG capsule Take 1 capsule (220 mg total) by mouth daily.     Allergies:   Bee venom, Penicillins, Aspirin, and Fentanyl   Social History   Socioeconomic History   Marital status: Married    Spouse name: Not on file   Number of children: Not on file   Years of education: Not on file   Highest education level: Not on file  Occupational History   Not on file  Tobacco Use   Smoking status: Former    Packs/day: 3.00    Years: 35.00    Pack years: 105.00    Types: Cigarettes    Quit date: 04/13/2006    Years since quitting: 14.6    Smokeless tobacco: Never  Vaping Use   Vaping Use: Never used  Substance and Sexual Activity   Alcohol use: No    Alcohol/week: 0.0 standard drinks   Drug use: No   Sexual activity: Not on file  Other Topics Concern   Not on file  Social History Narrative   Not on file   Social Determinants of Health   Financial Resource Strain: Not on file  Food Insecurity: Not on file  Transportation Needs: No Transportation Needs   Lack of Transportation (Medical): No   Lack of Transportation (Non-Medical): No  Physical Activity: Not on file  Stress: Not on file  Social Connections: Not on file     Family History: The patient's family history includes Atrial fibrillation in his father; Breast cancer in his maternal grandmother and mother; CAD in his father; Heart disease in his father and paternal uncle; Stomach cancer in his paternal aunt. ROS:   Please see the history of present illness.    All other systems reviewed and are negative.  EKGs/Labs/Other Studies Reviewed:    The following studies were reviewed today:  EKG:  EKG ordered today and personally reviewed.  The ekg ordered today demonstrates rate controlled atrial fibrillation  Recent Labs: 02/08/2020: Magnesium 2.1 03/10/2020: ALT 19 09/11/2020: NT-Pro BNP 669 10/06/2020: BUN 17; Creatinine, Ser 1.23; Hemoglobin 11.2; Platelets 331; Potassium 3.9; Sodium 135  Recent Lipid Panel    Component Value Date/Time   CHOL 156 12/03/2018 1502   TRIG 301 (H) 12/03/2018 1502   HDL 29 (L) 12/03/2018 1502   CHOLHDL 5.4 (H) 12/03/2018 1502   LDLCALC 78 12/03/2018 1502    Physical Exam:    VS:  BP (!) 154/80   Pulse 79   Ht 6\' 2"  (1.88 m)   Wt (!) 360 lb (163.3 kg)   SpO2 94%   BMI 46.22 kg/m     Wt Readings from Last 3 Encounters:  12/12/20 (!) 360 lb (163.3 kg)  10/04/20 (!) 380 lb (172.4 kg)  09/11/20 (!) 378 lb (171.5 kg)     GEN: Obese well nourished, well developed  in no acute distress HEENT: Normal NECK: No JVD; No  carotid bruits LYMPHATICS: No lymphadenopathy CARDIAC: Irregular rate and rhythm without RRR, no murmurs, rubs, gallops RESPIRATORY:  Clear to auscultation without rales, wheezing or rhonchi  ABDOMEN: Soft, non-tender, non-distended MUSCULOSKELETAL:  No edema; No deformity  SKIN: Warm and dry NEUROLOGIC:  Alert and oriented x 3 PSYCHIATRIC:  Normal affect    Signed, Shirlee More, MD  12/12/2020 12:49 PM    [facility]

## 2020-12-14 DIAGNOSIS — J432 Centrilobular emphysema: Secondary | ICD-10-CM | POA: Diagnosis not present

## 2020-12-14 DIAGNOSIS — I2510 Atherosclerotic heart disease of native coronary artery without angina pectoris: Secondary | ICD-10-CM | POA: Diagnosis not present

## 2020-12-14 DIAGNOSIS — Z87891 Personal history of nicotine dependence: Secondary | ICD-10-CM | POA: Diagnosis not present

## 2020-12-14 DIAGNOSIS — Z122 Encounter for screening for malignant neoplasm of respiratory organs: Secondary | ICD-10-CM | POA: Diagnosis not present

## 2020-12-20 DIAGNOSIS — G894 Chronic pain syndrome: Secondary | ICD-10-CM | POA: Diagnosis not present

## 2020-12-20 DIAGNOSIS — Z1389 Encounter for screening for other disorder: Secondary | ICD-10-CM | POA: Diagnosis not present

## 2020-12-20 DIAGNOSIS — M25552 Pain in left hip: Secondary | ICD-10-CM | POA: Diagnosis not present

## 2020-12-20 DIAGNOSIS — M47816 Spondylosis without myelopathy or radiculopathy, lumbar region: Secondary | ICD-10-CM | POA: Diagnosis not present

## 2020-12-20 DIAGNOSIS — Z79899 Other long term (current) drug therapy: Secondary | ICD-10-CM | POA: Diagnosis not present

## 2020-12-20 DIAGNOSIS — M1612 Unilateral primary osteoarthritis, left hip: Secondary | ICD-10-CM | POA: Diagnosis not present

## 2020-12-20 DIAGNOSIS — Z79891 Long term (current) use of opiate analgesic: Secondary | ICD-10-CM | POA: Diagnosis not present

## 2020-12-20 DIAGNOSIS — M5136 Other intervertebral disc degeneration, lumbar region: Secondary | ICD-10-CM | POA: Diagnosis not present

## 2020-12-21 ENCOUNTER — Ambulatory Visit (INDEPENDENT_AMBULATORY_CARE_PROVIDER_SITE_OTHER): Payer: Medicare HMO | Admitting: Orthopedic Surgery

## 2020-12-21 ENCOUNTER — Encounter: Payer: Self-pay | Admitting: Orthopedic Surgery

## 2020-12-21 ENCOUNTER — Ambulatory Visit (INDEPENDENT_AMBULATORY_CARE_PROVIDER_SITE_OTHER): Payer: Medicare HMO

## 2020-12-21 ENCOUNTER — Other Ambulatory Visit: Payer: Self-pay

## 2020-12-21 DIAGNOSIS — T63441D Toxic effect of venom of bees, accidental (unintentional), subsequent encounter: Secondary | ICD-10-CM | POA: Diagnosis not present

## 2020-12-21 DIAGNOSIS — Z89512 Acquired absence of left leg below knee: Secondary | ICD-10-CM

## 2020-12-21 NOTE — Progress Notes (Signed)
Office Visit Note   Patient: David Fitzgerald           Date of Birth: 14-Dec-1960           MRN: [id number] Visit Date: 12/21/2020              Requested by: Angelina Sheriff, MD [address] [place],  [place] [address] PCP: Angelina Sheriff, MD  Chief Complaint  Patient presents with   Left Leg - Routine Post Op    Left BKA 10/04/20      HPI: Patient is a 60 year old gentleman who is 2-1/2 months status post left transtibial amputation.  Patient has been fit for his prosthesis and he states it will be available at the end of the month.  Assessment & Plan: Visit Diagnoses:  1. Hx of BKA, left ([place])     Plan: Patient wishes to proceed with therapy at [facility].  If this does not work or is unavailable he will call and we will set him up for physical therapy here.  Follow-Up Instructions: Return if symptoms worsen or fail to improve.   Ortho Exam  Patient is alert, oriented, no adenopathy, well-dressed, normal affect, normal respiratory effort. Examination patient has excellent consolidation of the residual limb on the left there is no swelling no cellulitis no ulcers.  Patient was fit for his prosthesis at 2 months out from surgery prosthesis ready 3 months out from surgery.  Imaging: No results found.   Labs: Lab Results  Component Value Date   HGBA1C 6.0 (H) 03/08/2020   HGBA1C 6.0 (H) 02/04/2020   REPTSTATUS 03/15/2020 FINAL 03/10/2020   GRAMSTAIN  03/10/2020    MODERATE WBC PRESENT, PREDOMINANTLY PMN NO ORGANISMS SEEN    CULT  03/10/2020    RARE ENTEROBACTER CLOACAE CRITICAL RESULT CALLED TO, READ BACK BY AND VERIFIED WITH: RN TGilford Rile [phone] [id number] FCP NO ANAEROBES ISOLATED Performed at [facility], [place] [address] [place], [place] [address]    [place] 03/10/2020     Lab Results  Component Value Date   ALBUMIN 2.7 (L) 03/10/2020   ALBUMIN 2.9 (L) 03/08/2020   ALBUMIN 2.9 (L) 02/16/2020    Lab Results   Component Value Date   MG 2.1 02/08/2020   MG 2.0 02/04/2020   MG 1.9 02/03/2020   Lab Results  Component Value Date   VD25OH 19.57 (L) 03/08/2020   VD25OH 16.36 (L) 02/16/2020    No results found for: PREALBUMIN CBC EXTENDED Latest Ref Rng & Units 10/06/2020 10/05/2020 10/04/2020  WBC 4.0 - 10.5 K/uL 11.1(H) 10.6(H) 11.8(H)  RBC 4.22 - 5.81 MIL/uL 4.43 4.50 5.22  HGB 13.0 - 17.0 g/dL 11.2(L) 11.4(L) 13.3  HCT 39.0 - 52.0 % 38.4(L) 38.7(L) 44.4  PLT 150 - 400 K/uL 331 280 327  NEUTROABS 1.7 - 7.7 K/uL - - -  LYMPHSABS 0.7 - 4.0 K/uL - - -     There is no height or weight on file to calculate BMI.  Orders:  No orders of the defined types were placed in this encounter.  No orders of the defined types were placed in this encounter.    Procedures: No procedures performed  Clinical Data: No additional findings.  ROS:  All other systems negative, except as noted in the HPI. Review of Systems  Objective: Vital Signs: There were no vitals taken for this visit.  Specialty Comments:  No specialty comments available.  PMFS History: Patient Active  Problem List   Diagnosis Date Noted   IBS (irritable bowel syndrome) 12/08/2020   Dyspnea on minimal exertion 12/08/2020   Epigastric pain 12/08/2020   Hematuria 12/08/2020   Nausea & vomiting 12/08/2020   Type II diabetes mellitus, uncontrolled 12/08/2020   Atrial fibrillation with rapid ventricular response ([place]) 12/08/2020   Abscess of bursa of left ankle 10/04/2020   Subacute osteomyelitis, left ankle and foot ([place])    Abscess of ankle    Asthma 07/31/2020   Depression 07/31/2020   Dysrhythmia 07/31/2020   GERD (gastroesophageal reflux disease) 07/31/2020   History of kidney stones 07/31/2020   Pneumonia 07/31/2020   Left ankle pain 04/05/2020   Open dislocation of left ankle    A-fib (HCC)    OSA (obstructive sleep apnea) 03/09/2020   Wound dehiscence, traumatic injury repair, left medial ankle  03/08/2020    Vitamin D deficiency 02/16/2020   Chronic, continuous use of opioids 02/15/2020   Open fracture dislocation of left ankle 01/31/2020   Ankle fracture 01/31/2020   Permanent atrial fibrillation ([place])    Hypertension    Diabetes ([place])    Bee sting allergy    Hypotension 12/25/2017   Chronic anticoagulation 03/07/2017   Coronary artery calcification seen on CT scan 02/07/2017   CAD in native artery 02/07/2017   Persistent atrial fibrillation ([place]) 02/06/2017   Anxiety 02/06/2017   Arthritis 02/06/2017   Pernicious anemia 02/06/2017   Allergy to pollen 04/06/2015   OSA treated with BiPAP 12/14/2014   Obesity hypoventilation syndrome ([place]) 12/14/2014   Morbid obesity with BMI of 40.0-44.9, adult ([place]) 12/14/2014   Hypertensive heart disease 12/14/2014   Hyperlipidemia, mixed 12/14/2014   Type 2 diabetes mellitus with diabetic neuropathy ([place]) 12/14/2014   Chronic venous insufficiency 12/14/2014   Edema 12/14/2014   Morbid obesity ([place]) 12/14/2014   Bell's palsy 02/24/2013   Polyneuropathy in diabetes ([place]) 02/24/2013   Syndrome affecting cervical region 02/24/2013   Trigeminal neuralgia 02/24/2013   Lumbar pain with radiation down left leg 07/09/2012   BMI 45.0-49.9, adult ([place]) 07/09/2012   Chronic pain syndrome 07/09/2012   Morbid obesity with BMI of 45.0-49.9, adult ([place]) 07/09/2012   Pain syndrome, chronic 07/09/2012   Past Medical History:  Diagnosis Date   A-fib ([place])    Abscess of ankle    Abscess of bursa of left ankle 10/04/2020   Allergy to pollen 04/06/2015   Ankle fracture 01/31/2020   Anxiety 02/06/2017   Arthritis 02/06/2017   Asthma    as a child   Atrial fibrillation with rapid ventricular response ([place]) 12/08/2020   Bee sting allergy    wasp / mixed vespid   Bell's palsy 02/24/2013   BMI 45.0-49.9, adult ([place]) 07/09/2012   CAD in native artery 02/07/2017   Chronic anticoagulation 03/07/2017   Chronic pain syndrome 07/09/2012   Chronic venous insufficiency  12/14/2014   Chronic, continuous use of opioids 02/15/2020   Coronary artery calcification seen on CT scan 02/07/2017   Depression    Diabetes ([place])    Dyspnea on minimal exertion 12/08/2020   Dysrhythmia    afib   Edema 12/14/2014   Epigastric pain 12/08/2020   GERD (gastroesophageal reflux disease)    Hematuria 12/08/2020   History of kidney stones    Hyperlipidemia, mixed 12/14/2014   Hypertension    Hypertensive heart disease 12/14/2014   Hypotension 12/25/2017   IBS (irritable bowel syndrome)    Left ankle pain 04/05/2020   Lumbar pain with radiation down left  leg 07/09/2012   Morbid obesity ([place]) 12/14/2014   Morbid obesity with BMI of 40.0-44.9, adult ([place]) 12/14/2014   Morbid obesity with BMI of 45.0-49.9, adult ([place]) 07/09/2012   Nausea & vomiting 12/08/2020   Obesity hypoventilation syndrome ([place]) 12/14/2014   Open dislocation of left ankle    Open fracture dislocation of left ankle 01/31/2020   OSA (obstructive sleep apnea) 03/09/2020   doesn't use a Cpap   OSA treated with BiPAP 12/14/2014   Pain syndrome, chronic 07/09/2012   Permanent atrial fibrillation (HCC)    Pernicious anemia 02/06/2017   Persistent atrial fibrillation ([place]) 02/06/2017   CHADS2vasc of 2   Pneumonia    Polyneuropathy in diabetes ([place]) 02/24/2013   Subacute osteomyelitis, left ankle and foot (HCC)    Syndrome affecting cervical region 02/24/2013   Trigeminal neuralgia 02/24/2013   Type 2 diabetes mellitus with diabetic neuropathy ([place]) 12/14/2014   Type II diabetes mellitus, uncontrolled 12/08/2020   Vitamin D deficiency 02/16/2020   Wound dehiscence, traumatic injury repair, left medial ankle  03/08/2020    Family History  Problem Relation Age of Onset   Breast cancer Mother    Heart disease Father    CAD Father    Atrial fibrillation Father    Stomach cancer Paternal Aunt    Heart disease Paternal Uncle    Breast cancer Maternal Grandmother     Past Surgical History:  Procedure Laterality  Date    INTERNAL FIXATION LEFT TIBIA TO CALCANEUS AND APPLY SKIN GRAFT (Left Ankle)  04/05/2020   ADENOIDECTOMY     AMPUTATION Left 10/04/2020   Procedure: LEFT BELOW KNEE AMPUTATION;  Surgeon: [name], MD;  Location: [place];  Service: Orthopedics;  Laterality: Left;   BACK SURGERY  2002 /2003   CARPAL TUNNEL RELEASE Bilateral    I & D EXTREMITY Left 01/31/2020   Procedure: IRRIGATION AND DEBRIDEMENT ANKLE;  Surgeon: Altamese [place], MD;  Location: [place];  Service: Orthopedics;  Laterality: Left;   I & D EXTREMITY Left 03/10/2020   Procedure: DEBRIDEMENT LEFT ANKLE, APPLY SKIN GRAFT;  Surgeon: [name], MD;  Location: [place];  Service: Orthopedics;  Laterality: Left;   KNEE SURGERY Left 2005   ORIF ANKLE FRACTURE Left 01/31/2020   ORIF ANKLE FRACTURE Left 01/31/2020   Procedure: OPEN REDUCTION INTERNAL FIXATION (ORIF) ANKLE FRACTURE;  Surgeon: Altamese [place], MD;  Location: [place];  Service: Orthopedics;  Laterality: Left;   ORIF ANKLE FRACTURE Left 04/05/2020   Procedure: INTERNAL FIXATION LEFT TIBIA TO CALCANEUS AND APPLY SKIN GRAFT;  Surgeon: [name], MD;  Location: [place];  Service: Orthopedics;  Laterality: Left;   SHOULDER SURGERY Left 1998   TONSILLECTOMY     Social History   Occupational History   Not on file  Tobacco Use   Smoking status: Former    Packs/day: 3.00    Years: 35.00    Pack years: 105.00    Types: Cigarettes    Quit date: 04/13/2006    Years since quitting: 14.7   Smokeless tobacco: Never  Vaping Use   Vaping Use: Never used  Substance and Sexual Activity   Alcohol use: No    Alcohol/week: 0.0 standard drinks   Drug use: No   Sexual activity: Not on file

## 2021-01-08 ENCOUNTER — Telehealth: Payer: Self-pay | Admitting: Orthopedic Surgery

## 2021-01-08 ENCOUNTER — Other Ambulatory Visit: Payer: Self-pay

## 2021-01-08 DIAGNOSIS — Z89512 Acquired absence of left leg below knee: Secondary | ICD-10-CM | POA: Diagnosis not present

## 2021-01-08 NOTE — Telephone Encounter (Signed)
David Fitzgerald came in stating [facility] would like the pt to be set up for home PT since he was just given his prosthetic. Pt would like a CB to have the appt set up.   [phone] (cell #)

## 2021-01-08 NOTE — Telephone Encounter (Signed)
Called and lm on vm to advise pt that order ofr PT here in our office has been made. PT for in home is for home bound pts and gait training is sch for out patient. To call with questions.

## 2021-01-16 ENCOUNTER — Encounter: Payer: Self-pay | Admitting: Physical Therapy

## 2021-01-16 ENCOUNTER — Ambulatory Visit: Payer: Medicare HMO | Admitting: Physical Therapy

## 2021-01-16 ENCOUNTER — Other Ambulatory Visit: Payer: Self-pay

## 2021-01-16 DIAGNOSIS — M5442 Lumbago with sciatica, left side: Secondary | ICD-10-CM | POA: Diagnosis not present

## 2021-01-16 DIAGNOSIS — G8929 Other chronic pain: Secondary | ICD-10-CM | POA: Diagnosis not present

## 2021-01-16 DIAGNOSIS — M6281 Muscle weakness (generalized): Secondary | ICD-10-CM | POA: Diagnosis not present

## 2021-01-16 DIAGNOSIS — R293 Abnormal posture: Secondary | ICD-10-CM | POA: Diagnosis not present

## 2021-01-16 DIAGNOSIS — R2689 Other abnormalities of gait and mobility: Secondary | ICD-10-CM

## 2021-01-16 DIAGNOSIS — R2681 Unsteadiness on feet: Secondary | ICD-10-CM

## 2021-01-16 NOTE — Therapy (Signed)
[facility] [place], [place], [address] Phone: [phone]   Fax:  [phone]  Physical Therapy Evaluation  Patient Details  Name: David Fitzgerald MRN: [id number] Date of Birth: 1960-12-11 Referring Provider (PT): Meridee Score, MD   Encounter Date: 01/16/2021   PT End of Session - 01/16/21 1048     Visit Number 1    Number of Visits 25    Date for PT Re-Evaluation 04/12/21    Authorization Type [employer]    Authorization Time Period $10 copay    Progress Note Due on Visit 10    PT Start Time 1015    PT Stop Time 1055    PT Time Calculation (min) 40 min    Activity Tolerance Patient tolerated treatment well    Behavior During Therapy [facility] for tasks assessed/performed             Past Medical History:  Diagnosis Date   A-fib ([place])    Abscess of ankle    Abscess of bursa of left ankle 10/04/2020   Allergy to pollen 04/06/2015   Ankle fracture 01/31/2020   Anxiety 02/06/2017   Arthritis 02/06/2017   Asthma    as a child   Atrial fibrillation with rapid ventricular response ([place]) 12/08/2020   Bee sting allergy    wasp / mixed vespid   Bell's palsy 02/24/2013   BMI 45.0-49.9, adult ([place]) 07/09/2012   CAD in native artery 02/07/2017   Chronic anticoagulation 03/07/2017   Chronic pain syndrome 07/09/2012   Chronic venous insufficiency 12/14/2014   Chronic, continuous use of opioids 02/15/2020   Coronary artery calcification seen on CT scan 02/07/2017   Depression    Diabetes ([place])    Dyspnea on minimal exertion 12/08/2020   Dysrhythmia    afib   Edema 12/14/2014   Epigastric pain 12/08/2020   GERD (gastroesophageal reflux disease)    Hematuria 12/08/2020   History of kidney stones    Hyperlipidemia, mixed 12/14/2014   Hypertension    Hypertensive heart disease 12/14/2014   Hypotension 12/25/2017   IBS (irritable bowel syndrome)    Left ankle pain 04/05/2020   Lumbar pain with radiation down left leg 07/09/2012    Morbid obesity ([place]) 12/14/2014   Morbid obesity with BMI of 40.0-44.9, adult ([place]) 12/14/2014   Morbid obesity with BMI of 45.0-49.9, adult ([place]) 07/09/2012   Nausea & vomiting 12/08/2020   Obesity hypoventilation syndrome ([place]) 12/14/2014   Open dislocation of left ankle    Open fracture dislocation of left ankle 01/31/2020   OSA (obstructive sleep apnea) 03/09/2020   doesn't use a Cpap   OSA treated with BiPAP 12/14/2014   Pain syndrome, chronic 07/09/2012   Permanent atrial fibrillation (HCC)    Pernicious anemia 02/06/2017   Persistent atrial fibrillation ([place]) 02/06/2017   CHADS2vasc of 2   Pneumonia    Polyneuropathy in diabetes ([place]) 02/24/2013   Subacute osteomyelitis, left ankle and foot ([place])    Syndrome affecting cervical region 02/24/2013   Trigeminal neuralgia 02/24/2013   Type 2 diabetes mellitus with diabetic neuropathy ([place]) 12/14/2014   Type II diabetes mellitus, uncontrolled 12/08/2020   Vitamin D deficiency 02/16/2020   Wound dehiscence, traumatic injury repair, left medial ankle  03/08/2020    Past Surgical History:  Procedure Laterality Date    INTERNAL FIXATION LEFT TIBIA TO CALCANEUS AND APPLY SKIN GRAFT (Left Ankle)  04/05/2020   ADENOIDECTOMY     AMPUTATION Left 10/04/2020   Procedure: LEFT BELOW KNEE  AMPUTATION;  Surgeon: [name], MD;  Location: [place];  Service: Orthopedics;  Laterality: Left;   BACK SURGERY  2002 /2003   CARPAL TUNNEL RELEASE Bilateral    I & D EXTREMITY Left 01/31/2020   Procedure: IRRIGATION AND DEBRIDEMENT ANKLE;  Surgeon: Altamese McClain, MD;  Location: [place];  Service: Orthopedics;  Laterality: Left;   I & D EXTREMITY Left 03/10/2020   Procedure: DEBRIDEMENT LEFT ANKLE, APPLY SKIN GRAFT;  Surgeon: [name], MD;  Location: [place];  Service: Orthopedics;  Laterality: Left;   KNEE SURGERY Left 2005   ORIF ANKLE FRACTURE Left 01/31/2020   ORIF ANKLE FRACTURE Left 01/31/2020   Procedure: OPEN REDUCTION INTERNAL FIXATION (ORIF)  ANKLE FRACTURE;  Surgeon: Altamese McCarr, MD;  Location: [facility] [place];  Service: Orthopedics;  Laterality: Left;   ORIF ANKLE FRACTURE Left 04/05/2020   Procedure: INTERNAL FIXATION LEFT TIBIA TO CALCANEUS AND APPLY SKIN GRAFT;  Surgeon: [name], MD;  Location: [place];  Service: Orthopedics;  Laterality: Left;   SHOULDER SURGERY Left 1998   TONSILLECTOMY      There were no vitals filed for this visit.    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 She fractured left ankle with ORIF 01/31/2020. He recieved prosthesis 01/08/2021.    Patient is accompained by: Family member   mother   Pertinent History DM2, neuropathy, A-fib, arthritis, asthma, Bell's palsy, depression, IBS, LBP, obesity, left knee surgery, left shoulder sg    Limitations Standing;Walking;House hold activities;Sitting    Patient Stated Goals Walk with prosthesis in yard, fishing, mow yard    Currently in Pain? Yes    Pain Score 3    in last week, lowest 0/10 - 6/10   Pain Location Back    Pain Orientation Left;Lower    Pain Descriptors / Indicators Aching;Sharp    Pain Type Chronic pain    Pain Radiating Towards into left thigh    Pain Onset More than a month ago    Pain Frequency Intermittent    Aggravating Factors  standing in one place, stairs    Pain Relieving Factors lay down, pain meds    Effect of Pain on Daily Activities standing tolerance                [facility] PT Assessment - 01/16/21 1015       Assessment   Medical Diagnosis Z89.512 (ICD-10-CM) - Hx of BKA, left    Referring Provider (PT) Meridee Score, MD    Onset Date/Surgical Date 01/08/21   prosthesis delivery   Hand Dominance Right    Prior Therapy [facility]      Precautions   Precautions Fall;Back   no lifting >25#     Restrictions   Weight Bearing Restrictions No      Balance Screen   Has the patient fallen in the  past 6 months No    Has the patient had a decrease in activity level because of a fear of falling?  No    Is the patient reluctant to leave their home because of a fear of falling?  No      Home Environment   Living Environment Private residence    Living Arrangements Alone    Type of [place] entrance   4 steps in front with 2  rails can reach both.   Home Layout One level    [place] - 2 wheels;Cane - single point;Tub bench;Wheelchair - manual      Prior Function   Level of Independence Independent;Independent with household mobility without device;Independent with community mobility without device   prior to ankle fx Dec 2021   Vocation On disability    Leisure fishing, being outdoors      Posture/Postural Control   Posture/Postural Control Postural limitations    Postural Limitations Rounded Shoulders;Forward head;Flexed trunk;Weight shift right      ROM / Strength   AROM / PROM / Strength AROM;Strength      AROM   Overall AROM Comments standing hip extension -35*      Strength   Overall Strength Deficits    Strength Assessment Site Hip;Knee;Ankle    Right Hip Flexion 5/5    Right Hip Extension 4/5   tested grossly seated & functionally standing   Right Hip ABduction 4/5   tested grossly seated & functionally standing   Left Hip Flexion 4+/5    Left Hip Extension 3+/5   tested grossly seated & functionally standing   Left Hip ABduction 3+/5   tested grossly seated & functionally standing   Right/Left Knee Right;Left    Right Knee Flexion 4/5   tested grossly seated & functionally standing   Right Knee Extension 5/5    Left Knee Flexion 4-/5   tested grossly seated & functionally standing   Left Knee Extension 4/5    Right Ankle Dorsiflexion 5/5      Transfers   Transfers Sit to Stand;Stand to Sit    Sit to Stand 5: Supervision;With upper extremity assist;With armrests;From elevated surface;From chair/3-in-1;Other (comment)   used  back of legs against stable chair to stabilize.   Stand to Sit 5: Supervision;With upper extremity assist;With armrests;To elevated surface;To chair/3-in-1;Uncontrolled descent;Other (comment)   from RW     Ambulation/Gait   Ambulation/Gait Yes    Ambulation/Gait Assistance 5: Supervision    Ambulation/Gait Assistance Details excessive UE weight bearing on RW and partial weight on prosthesis    Ambulation Distance (Feet) 50 Feet    Assistive device Rolling walker;Prosthesis    Gait Pattern Step-through pattern;Decreased step length - right;Decreased stance time - left;Decreased hip/knee flexion - left;Decreased weight shift to left;Left circumduction;Left hip hike;Left flexed knee in stance;Antalgic;Lateral hip instability;Trunk flexed;Abducted - left    Ambulation Surface Level;Indoor      Standardized Balance Assessment   Standardized Balance Assessment Berg Balance Test      Berg Balance Test   Sit to Stand Needs minimal aid to stand or to stabilize    Standing Unsupported Able to stand 2 minutes with supervision    Sitting with Back Unsupported but Feet Supported on Floor or Stool Able to sit safely and securely 2 minutes    Stand to Sit Sits independently, has uncontrolled descent    Transfers Able to transfer safely, definite need of hands    Standing Unsupported with Eyes Closed Able to stand 10 seconds with supervision    Standing Unsupported with Feet Together Needs help to attain position but able to stand for 30 seconds with feet together    From Standing, Reach Forward with Outstretched Arm Reaches forward but needs supervision    From Standing Position, Pick up Object from Floor Unable to pick up and needs supervision    From Standing Position, Turn to Look Behind Over each Shoulder Needs supervision when turning  Turn 360 Degrees Needs assistance while turning    Standing Unsupported, Alternately Place Feet on Step/Stool Needs assistance to keep from falling or unable to  try    Standing Unsupported, One Foot in [employer] balance while stepping or standing    Standing on One Leg Tries to lift leg/unable to hold 3 seconds but remains standing independently    Total Score 20    Berg comment: BERG  < 36 high risk for falls (close to 100%) 46-51 moderate (>50%)   37-45 significant (>80%) 52-55 lower (> 25%)             Prosthetics Assessment - 01/16/21 1015       Prosthetics   Prosthetic Care Dependent with Skin check;Residual limb care;Care of non-amputated limb;Prosthetic cleaning;Ply sock cleaning;Correct ply sock adjustment;Proper wear schedule/adjustment;Proper weight-bearing schedule/adjustment    Donning prosthesis  Supervision    Doffing prosthesis  Supervision    Current prosthetic wear tolerance (days/week)  6 of 8 days since delivery    Current prosthetic wear tolerance (#hours/day)  1-1.5 hours 2x/day    Current prosthetic weight-bearing tolerance (hours/day)  Pt tolerated standing with partial weight on prosthesis for 5 min without c/o limb pain or discomfort    Edema pitting, limb circumference mid calf 16.5"    Residual limb condition  pin prick wound at distal tibia on scar with serous drainage. no other open areas.  cylinderical shape, normal color & temperature, little to no hair growth,    Prosthesis Description silicon liner with shuttle pin lock, secondary pelite liner, Total Contact Socket, dynamic response foot                       Objective measurements completed on examination: See above findings.       [place] Adult PT Treatment/Exercise - 01/16/21 1015       Prosthetics   Prosthetic Care Comments  PT issued 2 2XL shortend shrinkers to wear under liner until wound heals &/or stops draining.  PT recommended prosthesis wear 2hrs 2x/day with plan to increase weekly if no issues.  PT instructed in care of shrinkers.    Education Provided Skin check;Residual limb care;Prosthetic cleaning;Proper Donning;Proper wear  schedule/adjustment;Other (comment)   see prosthetic care comments   Person(s) Educated Patient;Parent(s)    Education Method Explanation;Demonstration;Tactile cues;Verbal cues    Education Method Verbalized understanding;Tactile cues required;Verbal cues required;Needs further instruction                       PT Short Term Goals - 01/16/21 1156       PT SHORT TERM GOAL #1   Title Patient demonstrates proper donning of prosthesis.    Time 4    Period Weeks    Status New    Target Date 02/15/21      PT SHORT TERM GOAL #2   Title Patient tolerates prosthesis wear >10 hours / day without increase in wound issues.    Time 4    Period Weeks    Status New    Target Date 02/15/21      PT SHORT TERM GOAL #3   Title Patient ambulates 150' with RW & prosthesis with supervision.    Time 4    Period Weeks    Status New    Target Date 02/15/21               PT Long Term Goals - 01/16/21 1150  PT LONG TERM GOAL #1   Title Patient verbalizes & demonstrates understanding of prosthetic care to enable safe utilization of prosthesis.    Time 12    Period Weeks    Status New    Target Date 04/12/21      PT LONG TERM GOAL #2   Title Patient tolerates prosthesis wear >90% of awake hours without skin or limb pain issues.    Time 12    Period Weeks    Status New    Target Date 04/12/21      PT LONG TERM GOAL #3   Title Patient reports back pain similar to prior to ankle fracture & amputation with standing & gait activities.    Time 12    Period Weeks    Status New    Target Date 04/12/21      PT LONG TERM GOAL #4   Title Berg Balance >/= 45/56 to indicate lower fall risk.    Time 12    Period Weeks    Status New    Target Date 04/12/21      PT LONG TERM GOAL #5   Title Patient ambulates >500' with LRAD & prosthesis modified independent.    Time 12    Period Weeks    Status New    Target Date 04/12/21      Additional Long Term Goals   Additional  Long Term Goals Yes      PT LONG TERM GOAL #6   Title Patient negotiates ramps, curbs & stairs with LRAD & prosthesis modified independent.    Time 12    Period Weeks    Status New    Target Date 04/12/21                    Plan - 01/16/21 1142     Clinical Impression Statement This 60yo male with morbid obesity had open fracture of left ankle 01/31/2020 which became infected requiring amputation below the knee 10/04/2020.  He has significant deconditioning including weakness, limited endurance and flexiblity.  He received his first prosthesis on 01/08/2021 and is dependent in care & use including management of wound on residual limb.  He has limited wear which limits function throughout his day.  He has history of chronic low back pain with sciatica which has increased with limited mobility.  Berg Balance score of 20/56 indicates high fall risk & dependency in standing ADLs.  His gait has significant deviations including excessive UE weight bearing which limits mobility & high fall risk.  Pt would benefit from skilled PT to improve his safety & mobility with his Transtibial prosthesis.    Personal Factors and Comorbidities Comorbidity 3+;Time since onset of injury/illness/exacerbation;Fitness    Comorbidities DM2, neuropathy, A-fib, arthritis, asthma, Bell's palsy, depression, IBS, LBP, obesity, left knee surgery, left shoulder sg    Examination-Activity Limitations Lift;Locomotion Level;Squat;Stairs;Stand;Transfers    Examination-Participation Restrictions Community Activity    Stability/Clinical Decision Making Evolving/Moderate complexity    Clinical Decision Making Moderate    Rehab Potential Good    PT Frequency 2x / week    PT Duration 12 weeks    PT Treatment/Interventions ADLs/Self Care Home Management;DME Instruction;Gait training;Stair training;Functional mobility training;Therapeutic activities;Therapeutic exercise;Balance training;Neuromuscular re-education;Moist  Heat;Cryotherapy;Electrical Stimulation;Ultrasound;Patient/family education;Prosthetic Training;Manual techniques    PT Next Visit Plan review prosthetic care, instruct in HEP at sink, prosthetic gait with RW including instruction in curbs & ramps    Consulted and Agree with Plan of Care Patient;Family member/caregiver  Family Member Consulted mother             Patient will benefit from skilled therapeutic intervention in order to improve the following deficits and impairments:  Abnormal gait, Decreased activity tolerance, Decreased balance, Decreased endurance, Decreased knowledge of use of DME, Decreased mobility, Decreased range of motion, Decreased skin integrity, Decreased strength, Difficulty walking, Increased edema, Impaired flexibility, Postural dysfunction, Obesity, Pain, Prosthetic Dependency  Visit Diagnosis: Other abnormalities of gait and mobility  Unsteadiness on feet  Muscle weakness (generalized)  Chronic left-sided low back pain with left-sided sciatica  Abnormal posture     Problem List Patient Active Problem List   Diagnosis Date Noted   IBS (irritable bowel syndrome) 12/08/2020   Dyspnea on minimal exertion 12/08/2020   Epigastric pain 12/08/2020   Hematuria 12/08/2020   Nausea & vomiting 12/08/2020   Type II diabetes mellitus, uncontrolled 12/08/2020   Atrial fibrillation with rapid ventricular response ([place]) 12/08/2020   Abscess of bursa of left ankle 10/04/2020   Subacute osteomyelitis, left ankle and foot ([place])    Abscess of ankle    Asthma 07/31/2020   Depression 07/31/2020   Dysrhythmia 07/31/2020   GERD (gastroesophageal reflux disease) 07/31/2020   History of kidney stones 07/31/2020   Pneumonia 07/31/2020   Left ankle pain 04/05/2020   Open dislocation of left ankle    A-fib (HCC)    OSA (obstructive sleep apnea) 03/09/2020   Wound dehiscence, traumatic injury repair, left medial ankle  03/08/2020   Vitamin D deficiency 02/16/2020    Chronic, continuous use of opioids 02/15/2020   Open fracture dislocation of left ankle 01/31/2020   Ankle fracture 01/31/2020   Permanent atrial fibrillation ([place])    Hypertension    Diabetes ([place])    Bee sting allergy    Hypotension 12/25/2017   Chronic anticoagulation 03/07/2017   Coronary artery calcification seen on CT scan 02/07/2017   CAD in native artery 02/07/2017   Persistent atrial fibrillation ([place]) 02/06/2017   Anxiety 02/06/2017   Arthritis 02/06/2017   Pernicious anemia 02/06/2017   Allergy to pollen 04/06/2015   OSA treated with BiPAP 12/14/2014   Obesity hypoventilation syndrome ([place]) 12/14/2014   Morbid obesity with BMI of 40.0-44.9, adult ([place]) 12/14/2014   Hypertensive heart disease 12/14/2014   Hyperlipidemia, mixed 12/14/2014   Type 2 diabetes mellitus with diabetic neuropathy ([place]) 12/14/2014   Chronic venous insufficiency 12/14/2014   Edema 12/14/2014   Morbid obesity ([place]) 12/14/2014   Bell's palsy 02/24/2013   Polyneuropathy in diabetes ([place]) 02/24/2013   Syndrome affecting cervical region 02/24/2013   Trigeminal neuralgia 02/24/2013   Lumbar pain with radiation down left leg 07/09/2012   BMI 45.0-49.9, adult ([place]) 07/09/2012   Chronic pain syndrome 07/09/2012   Morbid obesity with BMI of 45.0-49.9, adult ([place]) 07/09/2012   Pain syndrome, chronic 07/09/2012    Referring diagnosis? Y09.983 (ICD-10-CM) - Hx of BKA, left Treatment diagnosis? (if different than referring diagnosis)   Other abnormalities of gait and mobility R26.89  Change Dx        2.   Unsteadiness on feet R26.81  Change Dx     3.   Muscle weakness (generalized) M62.81  Change Dx     4.   Chronic left-sided low back pain with left-sided sciatica M54.42 ...  Change Dx     5.   Abnormal posture R29.3  Change Dx        What was this (referring dx) caused by? [x]  Surgery []  Fall []  Ongoing  issue []  Arthritis []  Other: ____________  Laterality: []  Rt [x]  Lt []  Both  Check  all possible CPT codes:  *CHOOSE 10 OR LESS*    [x]  97110 (Therapeutic Exercise)  []  92507 (SLP Treatment)  [x]  97112 (Neuro Re-ed)   []  92526 (Swallowing Treatment)   [x]  97116 (Gait Training)   []  [id number] (Cognitive Training, 1st 15 minutes) [x]  97140 (Manual Therapy)   []  97130 (Cognitive Training, each add'l 15 minutes)  [x]  97530 (Therapeutic Activities)  []  Other, List CPT Code ____________    [x]  70340 (Self Care)       []  All codes above (97110 - 97535)  []  97012 (Mechanical Traction)  [x]  97014 (E-stim Unattended)  []  97032 (E-stim manual)  []  97033 (Ionto)  [x]  97035 (Ultrasound)  []  97760 (Orthotic Fit) []  97750 (Physical Performance Training) []  [id number] (Aquatic Therapy) []  97034 (Contrast Bath) []  [id number] (Paraffin) []  97597 (Wound Care 1st 20 sq cm) []  97598 (Wound Care each add'l 20 sq cm) []  97016 (Vasopneumatic Device) []  [phone] Comptroller) [x]  [phone] (Prosthetic Training)   Jamey Reas, PT, DPT 01/16/2021, 11:59 AM  [facility] [address] [place], [place], [address] Phone: [phone]   Fax:  [phone]  Name: David Fitzgerald MRN: [id number] Date of Birth: 04-Dec-1960

## 2021-01-17 DIAGNOSIS — Z79899 Other long term (current) drug therapy: Secondary | ICD-10-CM | POA: Diagnosis not present

## 2021-01-17 DIAGNOSIS — Z79891 Long term (current) use of opiate analgesic: Secondary | ICD-10-CM | POA: Diagnosis not present

## 2021-01-17 DIAGNOSIS — M5136 Other intervertebral disc degeneration, lumbar region: Secondary | ICD-10-CM | POA: Diagnosis not present

## 2021-01-17 DIAGNOSIS — Z1389 Encounter for screening for other disorder: Secondary | ICD-10-CM | POA: Diagnosis not present

## 2021-01-17 DIAGNOSIS — G894 Chronic pain syndrome: Secondary | ICD-10-CM | POA: Diagnosis not present

## 2021-01-17 DIAGNOSIS — M1612 Unilateral primary osteoarthritis, left hip: Secondary | ICD-10-CM | POA: Diagnosis not present

## 2021-01-17 DIAGNOSIS — M25552 Pain in left hip: Secondary | ICD-10-CM | POA: Diagnosis not present

## 2021-01-17 DIAGNOSIS — M47816 Spondylosis without myelopathy or radiculopathy, lumbar region: Secondary | ICD-10-CM | POA: Diagnosis not present

## 2021-01-18 ENCOUNTER — Other Ambulatory Visit: Payer: Self-pay

## 2021-01-18 ENCOUNTER — Encounter: Payer: Self-pay | Admitting: Physical Therapy

## 2021-01-18 ENCOUNTER — Ambulatory Visit: Payer: Medicare HMO | Admitting: Physical Therapy

## 2021-01-18 ENCOUNTER — Telehealth: Payer: Self-pay | Admitting: Physical Therapy

## 2021-01-18 DIAGNOSIS — R293 Abnormal posture: Secondary | ICD-10-CM

## 2021-01-18 DIAGNOSIS — M5442 Lumbago with sciatica, left side: Secondary | ICD-10-CM

## 2021-01-18 DIAGNOSIS — G8929 Other chronic pain: Secondary | ICD-10-CM

## 2021-01-18 DIAGNOSIS — M6281 Muscle weakness (generalized): Secondary | ICD-10-CM

## 2021-01-18 DIAGNOSIS — R2689 Other abnormalities of gait and mobility: Secondary | ICD-10-CM

## 2021-01-18 DIAGNOSIS — R2681 Unsteadiness on feet: Secondary | ICD-10-CM

## 2021-01-18 NOTE — Telephone Encounter (Signed)
Mr. Ion needs a tall adult bariatric rolling walker. His insurance has not purchased a walking device for him so he should be eligible for insurance coverage. He lives in [place], [place] which is [place].  Can you please write prescription &/or put in for him to receive a tall adult bariatric rolling walker? Thank you Shirlean Mylar

## 2021-01-18 NOTE — Patient Instructions (Signed)

## 2021-01-18 NOTE — Therapy (Addendum)
[facility] [place], [place], [address] Phone: [phone]   Fax:  [phone]  Physical Therapy Treatment  Patient Details  Name: David Fitzgerald MRN: [id number] Date of Birth: 20-Nov-1960 Referring Provider (PT): Meridee Score, MD   Encounter Date: 01/18/2021   PT End of Session - 01/18/21 1144     Visit Number 2    Number of Visits 25    Date for PT Re-Evaluation 04/12/21    Authorization Type [employer]    Authorization Time Period $10 copay, Approved  Authorization #[id number]  Tracking #[id number] PT VISITS 12/13-1/31    Authorization - Visit Number 2    Authorization - Number of Visits 12    Progress Note Due on Visit 10    PT Start Time 1141    PT Stop Time 1226    PT Time Calculation (min) 45 min    Activity Tolerance Patient tolerated treatment well    Behavior During Therapy [facility] for tasks assessed/performed             Past Medical History:  Diagnosis Date   A-fib ([place])    Abscess of ankle    Abscess of bursa of left ankle 10/04/2020   Allergy to pollen 04/06/2015   Ankle fracture 01/31/2020   Anxiety 02/06/2017   Arthritis 02/06/2017   Asthma    as a child   Atrial fibrillation with rapid ventricular response ([place]) 12/08/2020   Bee sting allergy    wasp / mixed vespid   Bell's palsy 02/24/2013   BMI 45.0-49.9, adult ([place]) 07/09/2012   CAD in native artery 02/07/2017   Chronic anticoagulation 03/07/2017   Chronic pain syndrome 07/09/2012   Chronic venous insufficiency 12/14/2014   Chronic, continuous use of opioids 02/15/2020   Coronary artery calcification seen on CT scan 02/07/2017   Depression    Diabetes ([place])    Dyspnea on minimal exertion 12/08/2020   Dysrhythmia    afib   Edema 12/14/2014   Epigastric pain 12/08/2020   GERD (gastroesophageal reflux disease)    Hematuria 12/08/2020   History of kidney stones    Hyperlipidemia, mixed 12/14/2014   Hypertension    Hypertensive heart disease  12/14/2014   Hypotension 12/25/2017   IBS (irritable bowel syndrome)    Left ankle pain 04/05/2020   Lumbar pain with radiation down left leg 07/09/2012   Morbid obesity ([place]) 12/14/2014   Morbid obesity with BMI of 40.0-44.9, adult ([place]) 12/14/2014   Morbid obesity with BMI of 45.0-49.9, adult ([place]) 07/09/2012   Nausea & vomiting 12/08/2020   Obesity hypoventilation syndrome ([place]) 12/14/2014   Open dislocation of left ankle    Open fracture dislocation of left ankle 01/31/2020   OSA (obstructive sleep apnea) 03/09/2020   doesn't use a Cpap   OSA treated with BiPAP 12/14/2014   Pain syndrome, chronic 07/09/2012   Permanent atrial fibrillation ([facility])    Pernicious anemia 02/06/2017   Persistent atrial fibrillation ([place]) 02/06/2017   CHADS2vasc of 2   Pneumonia    Polyneuropathy in diabetes ([place]) 02/24/2013   Subacute osteomyelitis, left ankle and foot ([place])    Syndrome affecting cervical region 02/24/2013   Trigeminal neuralgia 02/24/2013   Type 2 diabetes mellitus with diabetic neuropathy ([place]) 12/14/2014   Type II diabetes mellitus, uncontrolled 12/08/2020   Vitamin D deficiency 02/16/2020   Wound dehiscence, traumatic injury repair, left medial ankle  03/08/2020    Past Surgical History:  Procedure Laterality Date    INTERNAL FIXATION  LEFT TIBIA TO CALCANEUS AND APPLY SKIN GRAFT (Left Ankle)  04/05/2020   ADENOIDECTOMY     AMPUTATION Left 10/04/2020   Procedure: LEFT BELOW KNEE AMPUTATION;  Surgeon: [name], MD;  Location: [place];  Service: Orthopedics;  Laterality: Left;   BACK SURGERY  2002 /2003   CARPAL TUNNEL RELEASE Bilateral    I & D EXTREMITY Left 01/31/2020   Procedure: IRRIGATION AND DEBRIDEMENT ANKLE;  Surgeon: Altamese Alder, MD;  Location: [place];  Service: Orthopedics;  Laterality: Left;   I & D EXTREMITY Left 03/10/2020   Procedure: DEBRIDEMENT LEFT ANKLE, APPLY SKIN GRAFT;  Surgeon: [name], MD;  Location: [place];  Service: Orthopedics;  Laterality: Left;    KNEE SURGERY Left 2005   ORIF ANKLE FRACTURE Left 01/31/2020   ORIF ANKLE FRACTURE Left 01/31/2020   Procedure: OPEN REDUCTION INTERNAL FIXATION (ORIF) ANKLE FRACTURE;  Surgeon: Altamese Hebron, MD;  Location: [place];  Service: Orthopedics;  Laterality: Left;   ORIF ANKLE FRACTURE Left 04/05/2020   Procedure: INTERNAL FIXATION LEFT TIBIA TO CALCANEUS AND APPLY SKIN GRAFT;  Surgeon: [name], MD;  Location: [place];  Service: Orthopedics;  Laterality: Left;   SHOULDER SURGERY Left 1998   TONSILLECTOMY      There were no vitals filed for this visit.   Subjective Assessment - 01/18/21 1141     Subjective He has been wearing prosthesis 4hrs 2x/day with shortened Vivewear shrinker under liner without issues.    Patient is accompained by: Family member   mother   Pertinent History DM2, neuropathy, A-fib, arthritis, asthma, Bell's palsy, depression, IBS, LBP, obesity, left knee surgery, left shoulder sg    Limitations Standing;Walking;House hold activities;Sitting    Patient Stated Goals Walk with prosthesis in yard, fishing, mow yard    Currently in Pain? Yes    Pain Score 4     Pain Location Back    Pain Orientation Left;Lower    Pain Descriptors / Indicators Aching;Sharp    Pain Type Chronic pain    Pain Radiating Towards into left thigh    Pain Onset More than a month ago    Pain Frequency Intermittent    Aggravating Factors  weather, standing in one place, stairs    Pain Relieving Factors lay down, pain meds    Effect of Pain on Daily Activities standing tolerance                               [facility] Adult PT Treatment/Exercise - 01/18/21 1141       Transfers   Transfers Sit to Stand;Stand to Sit    Sit to Stand 5: Supervision;With upper extremity assist;With armrests;From elevated surface;From chair/3-in-1;Other (comment)   uses RW to stabilize   Stand to Sit 5: Supervision;With upper extremity assist;With armrests;To elevated surface;To  chair/3-in-1;Uncontrolled descent;Other (comment)   from RW     Ambulation/Gait   Ambulation/Gait Yes    Ambulation/Gait Assistance 5: Supervision    Ambulation/Gait Assistance Details His RW was given to him. It is standard adult which is too short for him. And std weight capacity which is not durable enough for him. He weighs >360#.    Ambulation Distance (Feet) 100 Feet    Assistive device Rolling walker;Prosthesis    Gait Pattern Step-through pattern;Decreased step length - right;Decreased stance time - left;Decreased hip/knee flexion - left;Decreased weight shift to left;Left circumduction;Left hip hike;Left flexed knee in stance;Antalgic;Lateral hip instability;Trunk  flexed;Abducted - left    Ramp 5: Supervision   RW & TTA prosthesis   Ramp Details (indicate cue type and reason) PT demo, verbal & tactile cues on technique    Curb 5: Supervision   RW & TTA prosthesis   Curb Details (indicate cue type and reason) PT demo, verbal & tactile cues on technique      Posture/Postural Control   Posture/Postural Control --    Postural Limitations --      Neuro Re-ed    Neuro Re-ed Details  HEP at sink with cues for proprioception with socket.      Prosthetics   Prosthetic Care Comments  Continue 4hrs 2x/day wear and if no skin or limb pain issues will increase on Tuesdays (weekly).    Current prosthetic wear tolerance (days/week)  8 of 10 days since delivery    Current prosthetic wear tolerance (#hours/day)  4 hours 2x/day    Current prosthetic weight-bearing tolerance (hours/day)  Pt tolerated standing with partial weight on prosthesis for 5 min without c/o limb pain or discomfort    Edema pitting, limb circumference mid calf 16.5"    Residual limb condition  pin prick wound at distal tibia on scar with serous drainage. no other open areas.  cylinderical shape, normal color & temperature, little to no hair growth,    Education Provided Proper wear schedule/adjustment;Other (comment)   see  prosthetic care comments   Person(s) Educated Patient    Education Method Explanation;Verbal cues    Education Method Verbalized understanding;Verbal cues required;Needs further instruction    Donning Prosthesis Supervision                     PT Education - 01/18/21 1158     Education Details HEP at sink building standing tolerance with increased reps & activities without rest and socket / limb for proprioception    Person(s) Educated Patient    Methods Explanation;Demonstration;Tactile cues;Verbal cues;Handout    Comprehension Verbalized understanding;Returned demonstration;Verbal cues required;Tactile cues required;Need further instruction              PT Short Term Goals - 01/18/21 1245       PT SHORT TERM GOAL #1   Title Patient demonstrates proper donning of prosthesis.    Time 4    Period Weeks    Status On-going    Target Date 02/15/21      PT SHORT TERM GOAL #2   Title Patient tolerates prosthesis wear >10 hours / day without increase in wound issues.    Time 4    Period Weeks    Status On-going    Target Date 02/15/21      PT SHORT TERM GOAL #3   Title Patient ambulates 150' with RW & prosthesis with supervision.    Time 4    Period Weeks    Status On-going    Target Date 02/15/21               PT Long Term Goals - 01/18/21 1245       PT LONG TERM GOAL #1   Title Patient verbalizes & demonstrates understanding of prosthetic care to enable safe utilization of prosthesis.    Time 12    Period Weeks    Status On-going    Target Date 04/12/21      PT LONG TERM GOAL #2   Title Patient tolerates prosthesis wear >90% of awake hours without skin or limb pain issues.  Time 12    Period Weeks    Status On-going    Target Date 04/12/21      PT LONG TERM GOAL #3   Title Patient reports back pain similar to prior to ankle fracture & amputation with standing & gait activities.    Time 12    Period Weeks    Status On-going    Target  Date 04/12/21      PT LONG TERM GOAL #4   Title Berg Balance >/= 45/56 to indicate lower fall risk.    Time 12    Period Weeks    Status On-going    Target Date 04/12/21      PT LONG TERM GOAL #5   Title Patient ambulates >500' with LRAD & prosthesis modified independent.    Time 12    Period Weeks    Status On-going    Target Date 04/12/21      PT LONG TERM GOAL #6   Title Patient negotiates ramps, curbs & stairs with LRAD & prosthesis modified independent.    Time 12    Period Weeks    Status On-going    Target Date 04/12/21                   Plan - 01/18/21 1145     Clinical Impression Statement Pt appears to have general understanding of HEP at sink including how to progress reps & time of stance.  PT introduced how to negotiate curbs & ramps which he has basic understanding but requires BUE support of RW.  Pt continues to benefit from skilled PT.    Personal Factors and Comorbidities Comorbidity 3+;Time since onset of injury/illness/exacerbation;Fitness    Comorbidities DM2, neuropathy, A-fib, arthritis, asthma, Bell's palsy, depression, IBS, LBP, obesity, left knee surgery, left shoulder sg    Examination-Activity Limitations Lift;Locomotion Level;Squat;Stairs;Stand;Transfers    Examination-Participation Restrictions Community Activity    Stability/Clinical Decision Making Evolving/Moderate complexity    Rehab Potential Good    PT Frequency 2x / week    PT Duration 12 weeks    PT Treatment/Interventions ADLs/Self Care Home Management;DME Instruction;Gait training;Stair training;Functional mobility training;Therapeutic activities;Therapeutic exercise;Balance training;Neuromuscular re-education;Moist Heat;Cryotherapy;Electrical Stimulation;Ultrasound;Patient/family education;Prosthetic Training;Manual techniques    PT Next Visit Plan review prosthetic care, check HEP at sink, add hip flexor stretch & upright posture activities,  prosthetic gait with RW including  curbs & ramps    Consulted and Agree with Plan of Care Patient    Family Member Consulted --             Patient will benefit from skilled therapeutic intervention in order to improve the following deficits and impairments:  Abnormal gait, Decreased activity tolerance, Decreased balance, Decreased endurance, Decreased knowledge of use of DME, Decreased mobility, Decreased range of motion, Decreased skin integrity, Decreased strength, Difficulty walking, Increased edema, Impaired flexibility, Postural dysfunction, Obesity, Pain, Prosthetic Dependency  Visit Diagnosis: Other abnormalities of gait and mobility  Unsteadiness on feet  Muscle weakness (generalized)  Chronic left-sided low back pain with left-sided sciatica  Abnormal posture     Problem List Patient Active Problem List   Diagnosis Date Noted   IBS (irritable bowel syndrome) 12/08/2020   Dyspnea on minimal exertion 12/08/2020   Epigastric pain 12/08/2020   Hematuria 12/08/2020   Nausea & vomiting 12/08/2020   Type II diabetes mellitus, uncontrolled 12/08/2020   Atrial fibrillation with rapid ventricular response ([place]) 12/08/2020   Abscess of bursa of left ankle 10/04/2020   Subacute osteomyelitis, left  ankle and foot ([place])    Abscess of ankle    Asthma 07/31/2020   Depression 07/31/2020   Dysrhythmia 07/31/2020   GERD (gastroesophageal reflux disease) 07/31/2020   History of kidney stones 07/31/2020   Pneumonia 07/31/2020   Left ankle pain 04/05/2020   Open dislocation of left ankle    A-fib ([facility])    OSA (obstructive sleep apnea) 03/09/2020   Wound dehiscence, traumatic injury repair, left medial ankle  03/08/2020   Vitamin D deficiency 02/16/2020   Chronic, continuous use of opioids 02/15/2020   Open fracture dislocation of left ankle 01/31/2020   Ankle fracture 01/31/2020   Permanent atrial fibrillation ([place])    Hypertension    Diabetes ([place])    Bee sting allergy    Hypotension 12/25/2017    Chronic anticoagulation 03/07/2017   Coronary artery calcification seen on CT scan 02/07/2017   CAD in native artery 02/07/2017   Persistent atrial fibrillation ([place]) 02/06/2017   Anxiety 02/06/2017   Arthritis 02/06/2017   Pernicious anemia 02/06/2017   Allergy to pollen 04/06/2015   OSA treated with BiPAP 12/14/2014   Obesity hypoventilation syndrome ([place]) 12/14/2014   Morbid obesity with BMI of 40.0-44.9, adult ([place]) 12/14/2014   Hypertensive heart disease 12/14/2014   Hyperlipidemia, mixed 12/14/2014   Type 2 diabetes mellitus with diabetic neuropathy ([place]) 12/14/2014   Chronic venous insufficiency 12/14/2014   Edema 12/14/2014   Morbid obesity ([place]) 12/14/2014   Bell's palsy 02/24/2013   Polyneuropathy in diabetes ([place]) 02/24/2013   Syndrome affecting cervical region 02/24/2013   Trigeminal neuralgia 02/24/2013   Lumbar pain with radiation down left leg 07/09/2012   BMI 45.0-49.9, adult ([place]) 07/09/2012   Chronic pain syndrome 07/09/2012   Morbid obesity with BMI of 45.0-49.9, adult ([place]) 07/09/2012   Pain syndrome, chronic 07/09/2012    Jamey Reas, PT, DPT 01/18/2021, 12:55 PM  [facility] [address] [place], [place], [address] Phone: [phone]   Fax:  [phone]  Name: David Fitzgerald MRN: [id number] Date of Birth: [birth year]

## 2021-01-19 NOTE — Telephone Encounter (Signed)
Order written and pt is aware he will pick up on Monday when he comes in for his physical therapy appt. Thanks!

## 2021-01-22 ENCOUNTER — Ambulatory Visit: Payer: Medicare HMO | Admitting: Physical Therapy

## 2021-01-22 ENCOUNTER — Other Ambulatory Visit: Payer: Self-pay

## 2021-01-22 ENCOUNTER — Encounter: Payer: Self-pay | Admitting: Physical Therapy

## 2021-01-22 DIAGNOSIS — R2681 Unsteadiness on feet: Secondary | ICD-10-CM | POA: Diagnosis not present

## 2021-01-22 DIAGNOSIS — M5442 Lumbago with sciatica, left side: Secondary | ICD-10-CM

## 2021-01-22 DIAGNOSIS — R293 Abnormal posture: Secondary | ICD-10-CM | POA: Diagnosis not present

## 2021-01-22 DIAGNOSIS — G8929 Other chronic pain: Secondary | ICD-10-CM

## 2021-01-22 DIAGNOSIS — M6281 Muscle weakness (generalized): Secondary | ICD-10-CM | POA: Diagnosis not present

## 2021-01-22 DIAGNOSIS — R2689 Other abnormalities of gait and mobility: Secondary | ICD-10-CM

## 2021-01-22 NOTE — Therapy (Signed)
[place], [place], [address] Phone: [phone]   Fax:  [phone]  Physical Therapy Treatment  Patient Details  Name: David Fitzgerald MRN: [id number] Date of Birth: 06-Nov-1960 Referring Provider (PT): Meridee Score, MD   Encounter Date: 01/22/2021   PT End of Session - 01/22/21 1621     Visit Number 3    Number of Visits 25    Date for PT Re-Evaluation 04/12/21    Authorization Type [employer]    Authorization Time Period $10 copay, Approved  Authorization #[id number]  Tracking #[id number] PT VISITS 12/13-1/31    Authorization - Visit Number 3    Authorization - Number of Visits 12    Progress Note Due on Visit 10    PT Start Time 1430    PT Stop Time 7096    PT Time Calculation (min) 45 min    Equipment Utilized During Treatment Gait belt    Activity Tolerance Patient tolerated treatment well;Patient limited by pain    Behavior During Therapy [facility] for tasks assessed/performed             Past Medical History:  Diagnosis Date   A-fib ([place])    Abscess of ankle    Abscess of bursa of left ankle 10/04/2020   Allergy to pollen 04/06/2015   Ankle fracture 01/31/2020   Anxiety 02/06/2017   Arthritis 02/06/2017   Asthma    as a child   Atrial fibrillation with rapid ventricular response ([place]) 12/08/2020   Bee sting allergy    wasp / mixed vespid   Bell's palsy 02/24/2013   BMI 45.0-49.9, adult ([place]) 07/09/2012   CAD in native artery 02/07/2017   Chronic anticoagulation 03/07/2017   Chronic pain syndrome 07/09/2012   Chronic venous insufficiency 12/14/2014   Chronic, continuous use of opioids 02/15/2020   Coronary artery calcification seen on CT scan 02/07/2017   Depression    Diabetes ([place])    Dyspnea on minimal exertion 12/08/2020   Dysrhythmia    afib   Edema 12/14/2014   Epigastric pain 12/08/2020   GERD (gastroesophageal reflux disease)    Hematuria 12/08/2020   History of kidney stones     Hyperlipidemia, mixed 12/14/2014   Hypertension    Hypertensive heart disease 12/14/2014   Hypotension 12/25/2017   IBS (irritable bowel syndrome)    Left ankle pain 04/05/2020   Lumbar pain with radiation down left leg 07/09/2012   Morbid obesity ([place]) 12/14/2014   Morbid obesity with BMI of 40.0-44.9, adult ([place]) 12/14/2014   Morbid obesity with BMI of 45.0-49.9, adult ([place]) 07/09/2012   Nausea & vomiting 12/08/2020   Obesity hypoventilation syndrome ([place]) 12/14/2014   Open dislocation of left ankle    Open fracture dislocation of left ankle 01/31/2020   OSA (obstructive sleep apnea) 03/09/2020   doesn't use a Cpap   OSA treated with BiPAP 12/14/2014   Pain syndrome, chronic 07/09/2012   Permanent atrial fibrillation (HCC)    Pernicious anemia 02/06/2017   Persistent atrial fibrillation ([place]) 02/06/2017   CHADS2vasc of 2   Pneumonia    Polyneuropathy in diabetes ([place]) 02/24/2013   Subacute osteomyelitis, left ankle and foot ([place])    Syndrome affecting cervical region 02/24/2013   Trigeminal neuralgia 02/24/2013   Type 2 diabetes mellitus with diabetic neuropathy ([place]) 12/14/2014   Type II diabetes mellitus, uncontrolled 12/08/2020   Vitamin D deficiency 02/16/2020   Wound dehiscence, traumatic injury repair, left medial ankle  03/08/2020  Past Surgical History:  Procedure Laterality Date    INTERNAL FIXATION LEFT TIBIA TO CALCANEUS AND APPLY SKIN GRAFT (Left Ankle)  04/05/2020   ADENOIDECTOMY     AMPUTATION Left 10/04/2020   Procedure: LEFT BELOW KNEE AMPUTATION;  Surgeon: [name], MD;  Location: [facility];  Service: Orthopedics;  Laterality: Left;   BACK SURGERY  2002 /2003   CARPAL TUNNEL RELEASE Bilateral    I & D EXTREMITY Left 01/31/2020   Procedure: IRRIGATION AND DEBRIDEMENT ANKLE;  Surgeon: Altamese Rabun, MD;  Location: [facility];  Service: Orthopedics;  Laterality: Left;   I & D EXTREMITY Left 03/10/2020   Procedure: DEBRIDEMENT LEFT ANKLE, APPLY SKIN GRAFT;  Surgeon:  [name], MD;  Location: [facility];  Service: Orthopedics;  Laterality: Left;   KNEE SURGERY Left 2005   ORIF ANKLE FRACTURE Left 01/31/2020   ORIF ANKLE FRACTURE Left 01/31/2020   Procedure: OPEN REDUCTION INTERNAL FIXATION (ORIF) ANKLE FRACTURE;  Surgeon: Altamese Stanley, MD;  Location: [facility];  Service: Orthopedics;  Laterality: Left;   ORIF ANKLE FRACTURE Left 04/05/2020   Procedure: INTERNAL FIXATION LEFT TIBIA TO CALCANEUS AND APPLY SKIN GRAFT;  Surgeon: [name], MD;  Location: [facility];  Service: Orthopedics;  Laterality: Left;   SHOULDER SURGERY Left 1998   TONSILLECTOMY      There were no vitals filed for this visit.   Subjective Assessment - 01/22/21 1430     Subjective His limb hurts so bad he can hardly put weight on limb with prosthesis. His limb was so swollen yesterday that he could not put prosthesis on limb.    Patient is accompained by: Family member   mother   Pertinent History DM2, neuropathy, A-fib, arthritis, asthma, Bell's palsy, depression, IBS, LBP, obesity, left knee surgery, left shoulder sg    Limitations Standing;Walking;House hold activities;Sitting    Patient Stated Goals Walk with prosthesis in yard, fishing, mow yard    Currently in Pain? Yes    Pain Score 4    since last PT appt, lowest 0/10 to highest 6-7/10   Pain Location Leg    Pain Orientation Left;Distal    Pain Descriptors / Indicators Aching;Sore    Pain Onset More than a month ago    Pain Frequency Intermittent    Aggravating Factors  weight bearing on prosthesis    Pain Relieving Factors taking weight off prosthesis                               [facility] Adult PT Treatment/Exercise - 01/22/21 1430       Transfers   Transfers Sit to Stand;Stand to Sit    Sit to Stand 5: Supervision;With upper extremity assist;With armrests;From chair/3-in-1;Other (comment)   to RW & to axillary crutches   Stand to Sit 5: Supervision;With upper extremity assist;With armrests;To  chair/3-in-1;Other (comment)   from RW & from axillary crutches     Ambulation/Gait   Ambulation/Gait Yes    Ambulation/Gait Assistance 5: Supervision    Ambulation/Gait Assistance Details PT instructed in use of axillary crutches with 3 point pattern and step through.    Ambulation Distance (Feet) 75 Feet   RW 50' X 2 and axillary crutches 30' & 75'   Assistive device Rolling walker;Prosthesis;Crutches    Ambulation Surface Level;Indoor      Prosthetics   Prosthetic Care Comments  Continue 4hrs 2x/day wear and if no skin or limb pain issues  will increase on Tuesdays (weekly). Will not increase this week due to  limb pain.  Dr. Sharol Given came to PT clinic and agrees no infection noted with recommendation to continue with shortened Vivewear sock under liner.    Current prosthetic wear tolerance (days/week)  6 of last 7 days    Current prosthetic wear tolerance (#hours/day)  3-4 hours 2x/day    Current prosthetic weight-bearing tolerance (hours/day)  Distal tibia pain with weight bearing up to 6/10.    Residual limb condition  pin prick wound at distal tibia on scar with serous drainage. no other open areas.  cylinderical shape, normal color & temperature, little to no hair growth,    Education Provided Skin check;Residual limb care;Proper wear schedule/adjustment;Other (comment)   see prosthetic care comments.   Person(s) Educated Patient    Education Method Explanation;Demonstration;Tactile cues;Verbal cues    Education Method Verbalized understanding;Returned demonstration;Tactile cues required;Verbal cues required;Needs further instruction    Donning Prosthesis Supervision                       PT Short Term Goals - 01/18/21 1245       PT SHORT TERM GOAL #1   Title Patient demonstrates proper donning of prosthesis.    Time 4    Period Weeks    Status On-going    Target Date 02/15/21      PT SHORT TERM GOAL #2   Title Patient tolerates prosthesis wear >10 hours / day  without increase in wound issues.    Time 4    Period Weeks    Status On-going    Target Date 02/15/21      PT SHORT TERM GOAL #3   Title Patient ambulates 150' with RW & prosthesis with supervision.    Time 4    Period Weeks    Status On-going    Target Date 02/15/21               PT Long Term Goals - 01/18/21 1245       PT LONG TERM GOAL #1   Title Patient verbalizes & demonstrates understanding of prosthetic care to enable safe utilization of prosthesis.    Time 12    Period Weeks    Status On-going    Target Date 04/12/21      PT LONG TERM GOAL #2   Title Patient tolerates prosthesis wear >90% of awake hours without skin or limb pain issues.    Time 12    Period Weeks    Status On-going    Target Date 04/12/21      PT LONG TERM GOAL #3   Title Patient reports back pain similar to prior to ankle fracture & amputation with standing & gait activities.    Time 12    Period Weeks    Status On-going    Target Date 04/12/21      PT LONG TERM GOAL #4   Title Berg Balance >/= 45/56 to indicate lower fall risk.    Time 12    Period Weeks    Status On-going    Target Date 04/12/21      PT LONG TERM GOAL #5   Title Patient ambulates >500' with LRAD & prosthesis modified independent.    Time 12    Period Weeks    Status On-going    Target Date 04/12/21      PT LONG TERM GOAL #6   Title Patient negotiates ramps, curbs &  stairs with LRAD & prosthesis modified independent.    Time 12    Period Weeks    Status On-going    Target Date 04/12/21                   Plan - 01/22/21 1622     Clinical Impression Statement pt reports less limb pain when ambulating with tall adult RW or tall adult crutches compared to his current standard adult RW which causes flexed posture. His wound on limb does not appear infected but is internal suture trying to work its way out.    Personal Factors and Comorbidities Comorbidity 3+;Time since onset of  injury/illness/exacerbation;Fitness    Comorbidities DM2, neuropathy, A-fib, arthritis, asthma, Bell's palsy, depression, IBS, LBP, obesity, left knee surgery, left shoulder sg    Examination-Activity Limitations Lift;Locomotion Level;Squat;Stairs;Stand;Transfers    Examination-Participation Restrictions Community Activity    Stability/Clinical Decision Making Evolving/Moderate complexity    Rehab Potential Good    PT Frequency 2x / week    PT Duration 12 weeks    PT Treatment/Interventions ADLs/Self Care Home Management;DME Instruction;Gait training;Stair training;Functional mobility training;Therapeutic activities;Therapeutic exercise;Balance training;Neuromuscular re-education;Moist Heat;Cryotherapy;Electrical Stimulation;Ultrasound;Patient/family education;Prosthetic Training;Manual techniques    PT Next Visit Plan review prosthetic care, add hip flexor stretch & upright posture activities,  prosthetic gait with axillary crutches including curbs & ramps    Consulted and Agree with Plan of Care Patient             Patient will benefit from skilled therapeutic intervention in order to improve the following deficits and impairments:  Abnormal gait, Decreased activity tolerance, Decreased balance, Decreased endurance, Decreased knowledge of use of DME, Decreased mobility, Decreased range of motion, Decreased skin integrity, Decreased strength, Difficulty walking, Increased edema, Impaired flexibility, Postural dysfunction, Obesity, Pain, Prosthetic Dependency  Visit Diagnosis: Other abnormalities of gait and mobility  Unsteadiness on feet  Muscle weakness (generalized)  Chronic left-sided low back pain with left-sided sciatica  Abnormal posture     Problem List Patient Active Problem List   Diagnosis Date Noted   IBS (irritable bowel syndrome) 12/08/2020   Dyspnea on minimal exertion 12/08/2020   Epigastric pain 12/08/2020   Hematuria 12/08/2020   Nausea & vomiting 12/08/2020    Type II diabetes mellitus, uncontrolled 12/08/2020   Atrial fibrillation with rapid ventricular response ([place]) 12/08/2020   Abscess of bursa of left ankle 10/04/2020   Subacute osteomyelitis, left ankle and foot (HCC)    Abscess of ankle    Asthma 07/31/2020   Depression 07/31/2020   Dysrhythmia 07/31/2020   GERD (gastroesophageal reflux disease) 07/31/2020   History of kidney stones 07/31/2020   Pneumonia 07/31/2020   Left ankle pain 04/05/2020   Open dislocation of left ankle    A-fib (HCC)    OSA (obstructive sleep apnea) 03/09/2020   Wound dehiscence, traumatic injury repair, left medial ankle  03/08/2020   Vitamin D deficiency 02/16/2020   Chronic, continuous use of opioids 02/15/2020   Open fracture dislocation of left ankle 01/31/2020   Ankle fracture 01/31/2020   Permanent atrial fibrillation (HCC)    Hypertension    Diabetes ([place])    Bee sting allergy    Hypotension 12/25/2017   Chronic anticoagulation 03/07/2017   Coronary artery calcification seen on CT scan 02/07/2017   CAD in native artery 02/07/2017   Persistent atrial fibrillation ([place]) 02/06/2017   Anxiety 02/06/2017   Arthritis 02/06/2017   Pernicious anemia 02/06/2017   Allergy to pollen 04/06/2015   OSA treated with  BiPAP 12/14/2014   Obesity hypoventilation syndrome ([place]) 12/14/2014   Morbid obesity with BMI of 40.0-44.9, adult ([place]) 12/14/2014   Hypertensive heart disease 12/14/2014   Hyperlipidemia, mixed 12/14/2014   Type 2 diabetes mellitus with diabetic neuropathy ([place]) 12/14/2014   Chronic venous insufficiency 12/14/2014   Edema 12/14/2014   Morbid obesity ([place]) 12/14/2014   Bell's palsy 02/24/2013   Polyneuropathy in diabetes ([place]) 02/24/2013   Syndrome affecting cervical region 02/24/2013   Trigeminal neuralgia 02/24/2013   Lumbar pain with radiation down left leg 07/09/2012   BMI 45.0-49.9, adult ([place]) 07/09/2012   Chronic pain syndrome 07/09/2012   Morbid obesity with BMI of 45.0-49.9,  adult ([place]) 07/09/2012   Pain syndrome, chronic 07/09/2012    Jamey Reas, PT, DPT 01/22/2021, 4:27 PM  [facility] [address] [place], [place], [address] Phone: [phone]   Fax:  [phone]  Name: KARSIN PESTA MRN: [id number] Date of Birth: 04-12-1960

## 2021-01-23 ENCOUNTER — Encounter: Payer: Self-pay | Admitting: Physical Therapy

## 2021-01-23 ENCOUNTER — Ambulatory Visit: Payer: Medicare HMO | Admitting: Physical Therapy

## 2021-01-23 DIAGNOSIS — M5442 Lumbago with sciatica, left side: Secondary | ICD-10-CM | POA: Diagnosis not present

## 2021-01-23 DIAGNOSIS — R2689 Other abnormalities of gait and mobility: Secondary | ICD-10-CM

## 2021-01-23 DIAGNOSIS — G8929 Other chronic pain: Secondary | ICD-10-CM

## 2021-01-23 DIAGNOSIS — R293 Abnormal posture: Secondary | ICD-10-CM

## 2021-01-23 DIAGNOSIS — R2681 Unsteadiness on feet: Secondary | ICD-10-CM

## 2021-01-23 DIAGNOSIS — M6281 Muscle weakness (generalized): Secondary | ICD-10-CM | POA: Diagnosis not present

## 2021-01-23 NOTE — Therapy (Signed)
[facility] [place], [place], [address] Phone: [phone]   Fax:  [phone]  Physical Therapy Treatment  Patient Details  Name: David Fitzgerald MRN: [id number] Date of Birth: [birth year] Referring Provider (PT): Meridee Score, MD   Encounter Date: 01/23/2021   PT End of Session - 01/23/21 1300     Visit Number 4    Number of Visits 25    Date for PT Re-Evaluation 04/12/21    Authorization Type [employer]    Authorization Time Period $10 copay, Approved  Authorization #[id number]  Tracking #[id number] PT VISITS 12/13-1/31    Authorization - Visit Number 4    Authorization - Number of Visits 12    Progress Note Due on Visit 10    PT Start Time 1300    PT Stop Time 0109    PT Time Calculation (min) 43 min    Equipment Utilized During Treatment Gait belt    Activity Tolerance Patient tolerated treatment well;Patient limited by pain    Behavior During Therapy [facility] for tasks assessed/performed             Past Medical History:  Diagnosis Date   A-fib ([place])    Abscess of ankle    Abscess of bursa of left ankle 10/04/2020   Allergy to pollen 04/06/2015   Ankle fracture 01/31/2020   Anxiety 02/06/2017   Arthritis 02/06/2017   Asthma    as a child   Atrial fibrillation with rapid ventricular response ([place]) 12/08/2020   Bee sting allergy    wasp / mixed vespid   Bell's palsy 02/24/2013   BMI 45.0-49.9, adult ([place]) 07/09/2012   CAD in native artery 02/07/2017   Chronic anticoagulation 03/07/2017   Chronic pain syndrome 07/09/2012   Chronic venous insufficiency 12/14/2014   Chronic, continuous use of opioids 02/15/2020   Coronary artery calcification seen on CT scan 02/07/2017   Depression    Diabetes ([place])    Dyspnea on minimal exertion 12/08/2020   Dysrhythmia    afib   Edema 12/14/2014   Epigastric pain 12/08/2020   GERD (gastroesophageal reflux disease)    Hematuria 12/08/2020   History of kidney stones     Hyperlipidemia, mixed 12/14/2014   Hypertension    Hypertensive heart disease 12/14/2014   Hypotension 12/25/2017   IBS (irritable bowel syndrome)    Left ankle pain 04/05/2020   Lumbar pain with radiation down left leg 07/09/2012   Morbid obesity ([place]) 12/14/2014   Morbid obesity with BMI of 40.0-44.9, adult ([place]) 12/14/2014   Morbid obesity with BMI of 45.0-49.9, adult ([place]) 07/09/2012   Nausea & vomiting 12/08/2020   Obesity hypoventilation syndrome ([place]) 12/14/2014   Open dislocation of left ankle    Open fracture dislocation of left ankle 01/31/2020   OSA (obstructive sleep apnea) 03/09/2020   doesn't use a Cpap   OSA treated with BiPAP 12/14/2014   Pain syndrome, chronic 07/09/2012   Permanent atrial fibrillation (HCC)    Pernicious anemia 02/06/2017   Persistent atrial fibrillation ([place]) 02/06/2017   CHADS2vasc of 2   Pneumonia    Polyneuropathy in diabetes ([place]) 02/24/2013   Subacute osteomyelitis, left ankle and foot ([place])    Syndrome affecting cervical region 02/24/2013   Trigeminal neuralgia 02/24/2013   Type 2 diabetes mellitus with diabetic neuropathy ([place]) 12/14/2014   Type II diabetes mellitus, uncontrolled 12/08/2020   Vitamin D deficiency 02/16/2020   Wound dehiscence, traumatic injury repair, left medial ankle  03/08/2020  Past Surgical History:  Procedure Laterality Date    INTERNAL FIXATION LEFT TIBIA TO CALCANEUS AND APPLY SKIN GRAFT (Left Ankle)  04/05/2020   ADENOIDECTOMY     AMPUTATION Left 10/04/2020   Procedure: LEFT BELOW KNEE AMPUTATION;  Surgeon: [name], MD;  Location: [place];  Service: Orthopedics;  Laterality: Left;   BACK SURGERY  2002 /2003   CARPAL TUNNEL RELEASE Bilateral    I & D EXTREMITY Left 01/31/2020   Procedure: IRRIGATION AND DEBRIDEMENT ANKLE;  Surgeon: Altamese [place] Hill, MD;  Location: [place];  Service: Orthopedics;  Laterality: Left;   I & D EXTREMITY Left 03/10/2020   Procedure: DEBRIDEMENT LEFT ANKLE, APPLY SKIN GRAFT;  Surgeon:  [name], MD;  Location: [facility];  Service: Orthopedics;  Laterality: Left;   KNEE SURGERY Left 2005   ORIF ANKLE FRACTURE Left 01/31/2020   ORIF ANKLE FRACTURE Left 01/31/2020   Procedure: OPEN REDUCTION INTERNAL FIXATION (ORIF) ANKLE FRACTURE;  Surgeon: Altamese Winside, MD;  Location: [place];  Service: Orthopedics;  Laterality: Left;   ORIF ANKLE FRACTURE Left 04/05/2020   Procedure: INTERNAL FIXATION LEFT TIBIA TO CALCANEUS AND APPLY SKIN GRAFT;  Surgeon: [name], MD;  Location: [facility];  Service: Orthopedics;  Laterality: Left;   SHOULDER SURGERY Left 1998   TONSILLECTOMY      There were no vitals filed for this visit.   Subjective Assessment - 01/23/21 1300     Subjective He took Rx for RW to DME near his house and they are sending to inusrance today and RW should be in next week.  He also paid for tall adult bariatric crutches.    Patient is accompained by: Family member   mother   Pertinent History DM2, neuropathy, A-fib, arthritis, asthma, Bell's palsy, depression, IBS, LBP, obesity, left knee surgery, left shoulder sg    Limitations Standing;Walking;House hold activities;Sitting    Patient Stated Goals Walk with prosthesis in yard, fishing, mow yard    Currently in Pain? Yes    Pain Score 2     Pain Location Leg   residaul limb   Pain Orientation Left;Distal    Pain Descriptors / Indicators Aching;Sore    Pain Onset More than a month ago    Pain Frequency Intermittent    Aggravating Factors  weight bearing on prosthesis    Pain Relieving Factors taking prosthesis off    Effect of Pain on Daily Activities standing tolerance                               [facility] Adult PT Treatment/Exercise - 01/23/21 1301       Transfers   Transfers Sit to Stand;Stand to Sit    Sit to Stand 5: Supervision;With upper extremity assist;With armrests;From chair/3-in-1;Other (comment)   to RW   Stand to Sit 5: Supervision;With upper extremity assist;With armrests;To  chair/3-in-1;Other (comment)   from RW     Exercises   Exercises Other Exercises;Knee/Hip    Other Exercises  see HEP in pt instructions      Prosthetics   Current prosthetic wear tolerance (days/week)  6 of last 7 days    Current prosthetic wear tolerance (#hours/day)  3-4 hours 2x/day    Current prosthetic weight-bearing tolerance (hours/day)  Distal tibia pain with weight bearing up to 4/10.    Residual limb condition  pin prick wound at distal tibia on scar with serous drainage. no other open areas.  cylinderical shape, normal color & temperature, little to no hair growth,                 Access Code: [email or web address] URL: [email or web address] Date: 01/23/2021 Prepared by: Jamey Reas  Exercises Modified Marcello Moores Stretch - 1-3 x daily - 7 x weekly - 1 sets - 2-3 reps - 30 seconds hold Supine Straight Leg Raises - 1 x daily - 7 x weekly - 1 sets - 10 reps - 5 seconds hold Beginner Bridge - 1 x daily - 7 x weekly - 1 sets - 10 reps - 5 seconds hold Sidelying Hip Abduction - 1 x daily - 5 x weekly - 1 sets - 10 reps - 5 seconds hold Sidelying Hip Flexion & Extension - 1 x daily - 5 x weekly - 1 sets - 10 reps - 5 seconds hold Standing posture with back to counter - 1 x daily - 5 x weekly - 1 sets - 10 reps - 5 seconds hold Upright Stance at Door Frame Single Arm - 1-3 x daily - 7 x weekly - 1 sets - 2 reps - 2 deep breathes hold Upright Stance at Door Frame with Both Arms - 1-3 x daily - 7 x weekly - 1 sets - 2 reps - 2 deep breathes hold hip width stance head turns - 1 x daily - 5 x weekly - 1 sets - 10 reps - 5 seconds hold Feet Apart with Eyes Closed with Head Motions - 1 x daily - 4 x weekly - 1 sets - 10 reps - 2 seconds hold    PT Education - 01/23/21 1337     Education Details Access Code: [email or web address]    Person(s) Educated Patient    Methods Explanation;Demonstration;Tactile cues;Verbal cues;Handout    Comprehension Verbalized understanding;Returned  demonstration;Verbal cues required;Tactile cues required;Need further instruction              PT Short Term Goals - 01/18/21 1245       PT SHORT TERM GOAL #1   Title Patient demonstrates proper donning of prosthesis.    Time 4    Period Weeks    Status On-going    Target Date 02/15/21      PT SHORT TERM GOAL #2   Title Patient tolerates prosthesis wear >10 hours / day without increase in wound issues.    Time 4    Period Weeks    Status On-going    Target Date 02/15/21      PT SHORT TERM GOAL #3   Title Patient ambulates 150' with RW & prosthesis with supervision.    Time 4    Period Weeks    Status On-going    Target Date 02/15/21               PT Long Term Goals - 01/18/21 1245       PT LONG TERM GOAL #1   Title Patient verbalizes & demonstrates understanding of prosthetic care to enable safe utilization of prosthesis.    Time 12    Period Weeks    Status On-going    Target Date 04/12/21      PT LONG TERM GOAL #2   Title Patient tolerates prosthesis wear >90% of awake hours without skin or limb pain issues.    Time 12    Period Weeks    Status On-going    Target Date 04/12/21      PT LONG TERM GOAL #3   Title Patient  reports back pain similar to prior to ankle fracture & amputation with standing & gait activities.    Time 12    Period Weeks    Status On-going    Target Date 04/12/21      PT LONG TERM GOAL #4   Title Berg Balance >/= 45/56 to indicate lower fall risk.    Time 12    Period Weeks    Status On-going    Target Date 04/12/21      PT LONG TERM GOAL #5   Title Patient ambulates >500' with LRAD & prosthesis modified independent.    Time 12    Period Weeks    Status On-going    Target Date 04/12/21      PT LONG TERM GOAL #6   Title Patient negotiates ramps, curbs & stairs with LRAD & prosthesis modified independent.    Time 12    Period Weeks    Status On-going    Target Date 04/12/21                   Plan -  01/23/21 1300     Clinical Impression Statement PT instructed in HEP to work on hip flexor tightness & upright posture.  He appears to have a general understanding. His limb pain appears less.  Pt continues to benefit from skilled PT.    Personal Factors and Comorbidities Comorbidity 3+;Time since onset of injury/illness/exacerbation;Fitness    Comorbidities DM2, neuropathy, A-fib, arthritis, asthma, Bell's palsy, depression, IBS, LBP, obesity, left knee surgery, left shoulder sg    Examination-Activity Limitations Lift;Locomotion Level;Squat;Stairs;Stand;Transfers    Examination-Participation Restrictions Community Activity    Stability/Clinical Decision Making Evolving/Moderate complexity    Rehab Potential Good    PT Frequency 2x / week    PT Duration 12 weeks    PT Treatment/Interventions ADLs/Self Care Home Management;DME Instruction;Gait training;Stair training;Functional mobility training;Therapeutic activities;Therapeutic exercise;Balance training;Neuromuscular re-education;Moist Heat;Cryotherapy;Electrical Stimulation;Ultrasound;Patient/family education;Prosthetic Training;Manual techniques    PT Next Visit Plan review prosthetic care, standing balance activities,  prosthetic gait with axillary crutches including curbs & ramps    PT Home Exercise Plan amputee HEP at sink & Access Code: [email or web address]    Consulted and Agree with Plan of Care Patient             Patient will benefit from skilled therapeutic intervention in order to improve the following deficits and impairments:  Abnormal gait, Decreased activity tolerance, Decreased balance, Decreased endurance, Decreased knowledge of use of DME, Decreased mobility, Decreased range of motion, Decreased skin integrity, Decreased strength, Difficulty walking, Increased edema, Impaired flexibility, Postural dysfunction, Obesity, Pain, Prosthetic Dependency  Visit Diagnosis: Other abnormalities of gait and mobility  Unsteadiness on  feet  Muscle weakness (generalized)  Chronic left-sided low back pain with left-sided sciatica  Abnormal posture     Problem List Patient Active Problem List   Diagnosis Date Noted   IBS (irritable bowel syndrome) 12/08/2020   Dyspnea on minimal exertion 12/08/2020   Epigastric pain 12/08/2020   Hematuria 12/08/2020   Nausea & vomiting 12/08/2020   Type II diabetes mellitus, uncontrolled 12/08/2020   Atrial fibrillation with rapid ventricular response ([place]) 12/08/2020   Abscess of bursa of left ankle 10/04/2020   Subacute osteomyelitis, left ankle and foot (HCC)    Abscess of ankle    Asthma 07/31/2020   Depression 07/31/2020   Dysrhythmia 07/31/2020   GERD (gastroesophageal reflux disease) 07/31/2020   History of kidney stones 07/31/2020   Pneumonia 07/31/2020   Left ankle  pain 04/05/2020   Open dislocation of left ankle    A-fib (H[facility])    OSA (obstructive sleep apnea) 03/09/2020   Wound dehiscence, traumatic injury repair, left medial ankle  03/08/2020   Vitamin D deficiency 02/16/2020   Chronic, continuous use of opioids 02/15/2020   Open fracture dislocation of left ankle 01/31/2020   Ankle fracture 01/31/2020   Permanent atrial fibrillation ([place])    Hypertension    Diabetes ([place])    Bee sting allergy    Hypotension 12/25/2017   Chronic anticoagulation 03/07/2017   Coronary artery calcification seen on CT scan 02/07/2017   CAD in native artery 02/07/2017   Persistent atrial fibrillation ([place]) 02/06/2017   Anxiety 02/06/2017   Arthritis 02/06/2017   Pernicious anemia 02/06/2017   Allergy to pollen 04/06/2015   OSA treated with BiPAP 12/14/2014   Obesity hypoventilation syndrome ([place]) 12/14/2014   Morbid obesity with BMI of 40.0-44.9, adult ([place]) 12/14/2014   Hypertensive heart disease 12/14/2014   Hyperlipidemia, mixed 12/14/2014   Type 2 diabetes mellitus with diabetic neuropathy ([place]) 12/14/2014   Chronic venous insufficiency 12/14/2014   Edema  12/14/2014   Morbid obesity ([place]) 12/14/2014   Bell's palsy 02/24/2013   Polyneuropathy in diabetes ([place]) 02/24/2013   Syndrome affecting cervical region 02/24/2013   Trigeminal neuralgia 02/24/2013   Lumbar pain with radiation down left leg 07/09/2012   BMI 45.0-49.9, adult ([place]) 07/09/2012   Chronic pain syndrome 07/09/2012   Morbid obesity with BMI of 45.0-49.9, adult ([place]) 07/09/2012   Pain syndrome, chronic 07/09/2012    Jamey Reas, PT, DPT 01/23/2021, 4:27 PM  [facility] [address] [place], [place], [address] Phone: [phone]   Fax:  [phone]  Name: David Fitzgerald MRN: [id number] Date of Birth: 1960-11-05

## 2021-01-23 NOTE — Patient Instructions (Signed)
Access Code: [email or web address] URL: [email or web address] Date: 01/23/2021 Prepared by: Jamey Reas  Exercises Modified Marcello Moores Stretch - 1-3 x daily - 7 x weekly - 1 sets - 2-3 reps - 30 seconds hold Supine Straight Leg Raises - 1 x daily - 7 x weekly - 1 sets - 10 reps - 5 seconds hold Beginner Bridge - 1 x daily - 7 x weekly - 1 sets - 10 reps - 5 seconds hold Sidelying Hip Abduction - 1 x daily - 5 x weekly - 1 sets - 10 reps - 5 seconds hold Sidelying Hip Flexion & Extension - 1 x daily - 5 x weekly - 1 sets - 10 reps - 5 seconds hold Standing posture with back to counter - 1 x daily - 5 x weekly - 1 sets - 10 reps - 5 seconds hold Upright Stance at Door Frame Single Arm - 1-3 x daily - 7 x weekly - 1 sets - 2 reps - 2 deep breathes hold Upright Stance at Door Frame with Both Arms - 1-3 x daily - 7 x weekly - 1 sets - 2 reps - 2 deep breathes hold hip width stance head turns - 1 x daily - 5 x weekly - 1 sets - 10 reps - 5 seconds hold Feet Apart with Eyes Closed with Head Motions - 1 x daily - 4 x weekly - 1 sets - 10 reps - 2 seconds hold

## 2021-01-30 ENCOUNTER — Encounter: Payer: Medicare HMO | Admitting: Physical Therapy

## 2021-01-30 DIAGNOSIS — S53135A Medial dislocation of left ulnohumeral joint, initial encounter: Secondary | ICD-10-CM | POA: Diagnosis not present

## 2021-01-30 DIAGNOSIS — Z87891 Personal history of nicotine dependence: Secondary | ICD-10-CM | POA: Diagnosis not present

## 2021-01-30 DIAGNOSIS — M7989 Other specified soft tissue disorders: Secondary | ICD-10-CM | POA: Diagnosis not present

## 2021-01-30 DIAGNOSIS — S53105A Unspecified dislocation of left ulnohumeral joint, initial encounter: Secondary | ICD-10-CM | POA: Diagnosis not present

## 2021-01-30 DIAGNOSIS — S53132A Medial subluxation of left ulnohumeral joint, initial encounter: Secondary | ICD-10-CM | POA: Diagnosis not present

## 2021-01-30 DIAGNOSIS — R0902 Hypoxemia: Secondary | ICD-10-CM | POA: Diagnosis not present

## 2021-01-30 DIAGNOSIS — W19XXXA Unspecified fall, initial encounter: Secondary | ICD-10-CM | POA: Diagnosis not present

## 2021-01-30 DIAGNOSIS — E119 Type 2 diabetes mellitus without complications: Secondary | ICD-10-CM | POA: Diagnosis not present

## 2021-01-30 DIAGNOSIS — I4891 Unspecified atrial fibrillation: Secondary | ICD-10-CM | POA: Diagnosis not present

## 2021-01-31 DIAGNOSIS — E119 Type 2 diabetes mellitus without complications: Secondary | ICD-10-CM | POA: Diagnosis not present

## 2021-01-31 DIAGNOSIS — W19XXXA Unspecified fall, initial encounter: Secondary | ICD-10-CM | POA: Diagnosis not present

## 2021-01-31 DIAGNOSIS — I10 Essential (primary) hypertension: Secondary | ICD-10-CM | POA: Diagnosis not present

## 2021-01-31 DIAGNOSIS — S53135A Medial dislocation of left ulnohumeral joint, initial encounter: Secondary | ICD-10-CM | POA: Diagnosis not present

## 2021-01-31 DIAGNOSIS — S53105A Unspecified dislocation of left ulnohumeral joint, initial encounter: Secondary | ICD-10-CM | POA: Diagnosis not present

## 2021-02-06 ENCOUNTER — Telehealth: Payer: Self-pay | Admitting: Cardiology

## 2021-02-06 ENCOUNTER — Encounter: Payer: Medicare HMO | Admitting: Physical Therapy

## 2021-02-06 MED ORDER — APIXABAN 5 MG PO TABS: 5.0000 mg | ORAL_TABLET | Freq: Two times a day (BID) | ORAL | 1 refills | Status: DC

## 2021-02-06 MED ORDER — SACUBITRIL-VALSARTAN 24-26 MG PO TABS: 1.0000 | ORAL_TABLET | Freq: Two times a day (BID) | ORAL | 1 refills | Status: DC

## 2021-02-06 NOTE — Telephone Encounter (Signed)
Spoke to patient. He reports that he needed new rx only. Sent in.

## 2021-02-06 NOTE — Telephone Encounter (Signed)
° °  Patient calling the office for samples of medication:   1.  What medication and dosage are you requesting samples for?   ELIQUIS 5 MG TABS tablet  sacubitril-valsartan (ENTRESTO) 24-26 MG  2.  Are you currently out of this medication? Almost out of meds

## 2021-02-08 ENCOUNTER — Encounter: Payer: Medicare HMO | Admitting: Physical Therapy

## 2021-02-12 DIAGNOSIS — S53105S Unspecified dislocation of left ulnohumeral joint, sequela: Secondary | ICD-10-CM | POA: Diagnosis not present

## 2021-02-13 ENCOUNTER — Encounter: Payer: Medicare HMO | Admitting: Physical Therapy

## 2021-02-14 ENCOUNTER — Ambulatory Visit (INDEPENDENT_AMBULATORY_CARE_PROVIDER_SITE_OTHER): Payer: Medicare HMO | Admitting: *Deleted

## 2021-02-14 DIAGNOSIS — G894 Chronic pain syndrome: Secondary | ICD-10-CM | POA: Diagnosis not present

## 2021-02-14 DIAGNOSIS — M1612 Unilateral primary osteoarthritis, left hip: Secondary | ICD-10-CM | POA: Diagnosis not present

## 2021-02-14 DIAGNOSIS — T63441D Toxic effect of venom of bees, accidental (unintentional), subsequent encounter: Secondary | ICD-10-CM

## 2021-02-14 DIAGNOSIS — Z79899 Other long term (current) drug therapy: Secondary | ICD-10-CM | POA: Diagnosis not present

## 2021-02-14 DIAGNOSIS — M47816 Spondylosis without myelopathy or radiculopathy, lumbar region: Secondary | ICD-10-CM | POA: Diagnosis not present

## 2021-02-14 DIAGNOSIS — M5136 Other intervertebral disc degeneration, lumbar region: Secondary | ICD-10-CM | POA: Diagnosis not present

## 2021-02-14 DIAGNOSIS — Z79891 Long term (current) use of opiate analgesic: Secondary | ICD-10-CM | POA: Diagnosis not present

## 2021-02-14 DIAGNOSIS — M25552 Pain in left hip: Secondary | ICD-10-CM | POA: Diagnosis not present

## 2021-02-14 DIAGNOSIS — Z1389 Encounter for screening for other disorder: Secondary | ICD-10-CM | POA: Diagnosis not present

## 2021-02-15 ENCOUNTER — Encounter: Payer: Medicare HMO | Admitting: Physical Therapy

## 2021-02-17 IMAGING — DX DG ANKLE PORT 2V*L*
4 series · 4 of 4 positions shown · non-contrast
Comparison: Prior studies of 01/31/2020.

CLINICAL DATA: ORIF left ankle.

EXAM:
PORTABLE LEFT ANKLE - 2 VIEW

[ankle ap (1 of 2)]
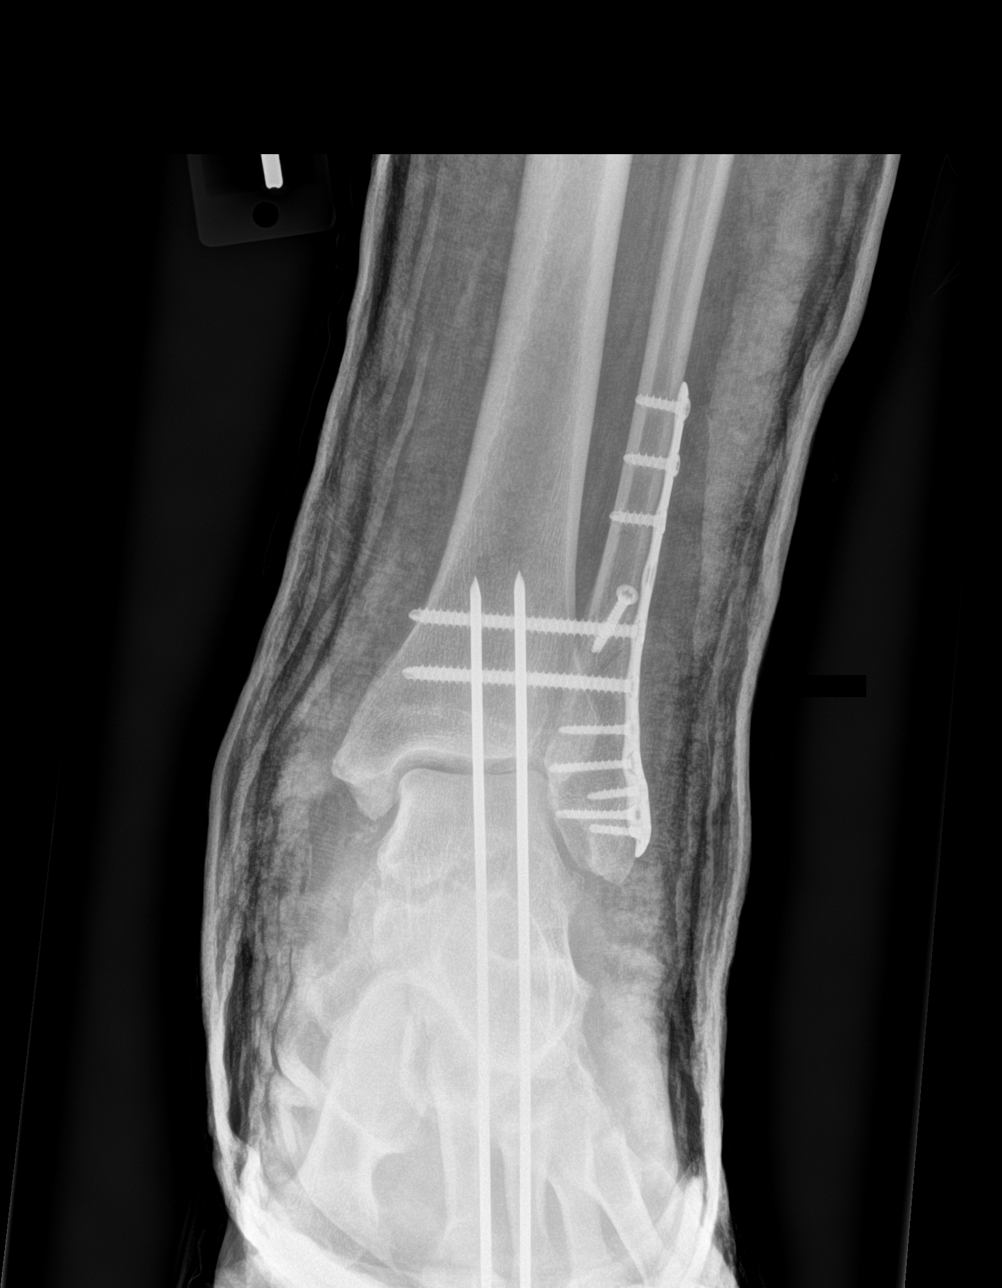

[ankle obl]
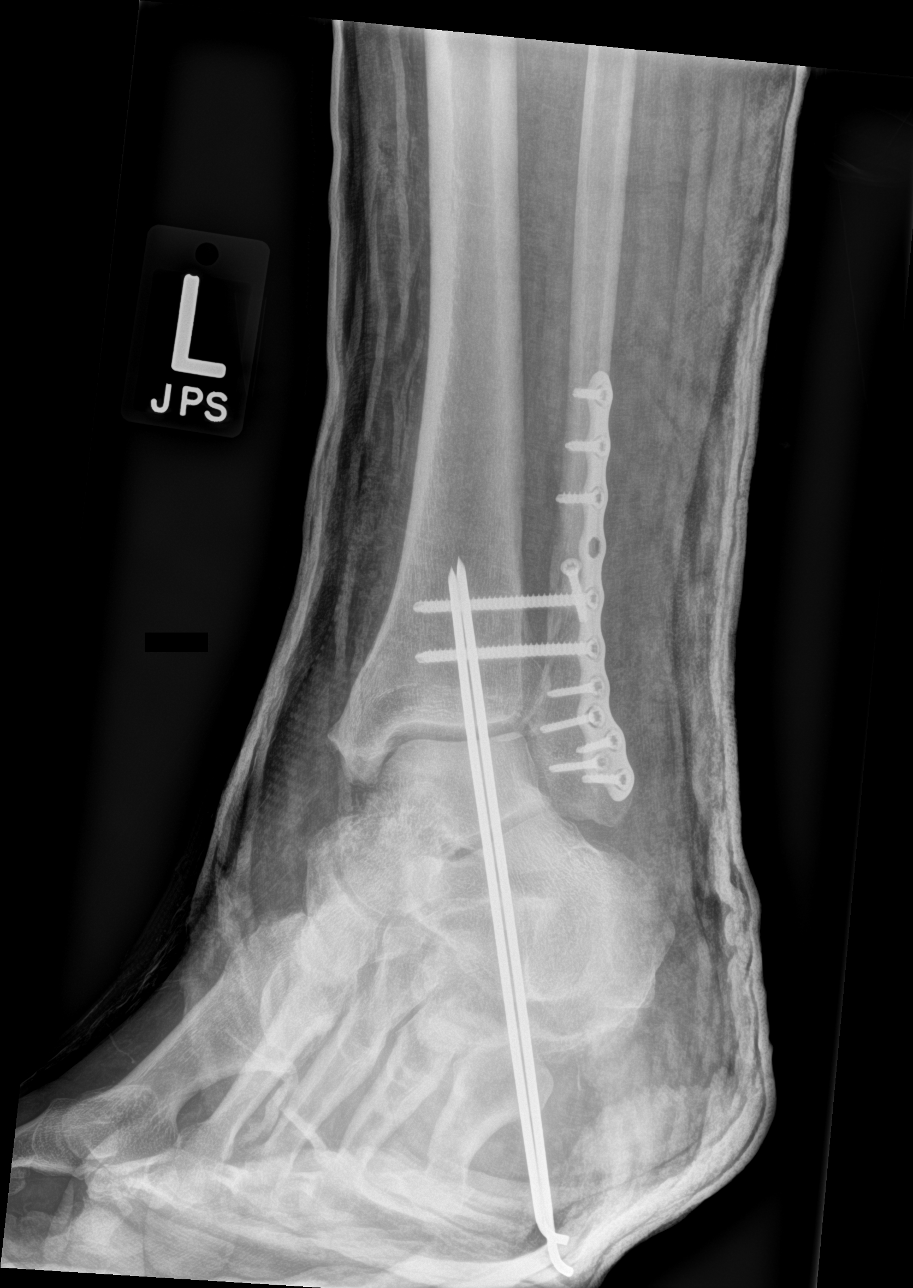

[ankle lat]
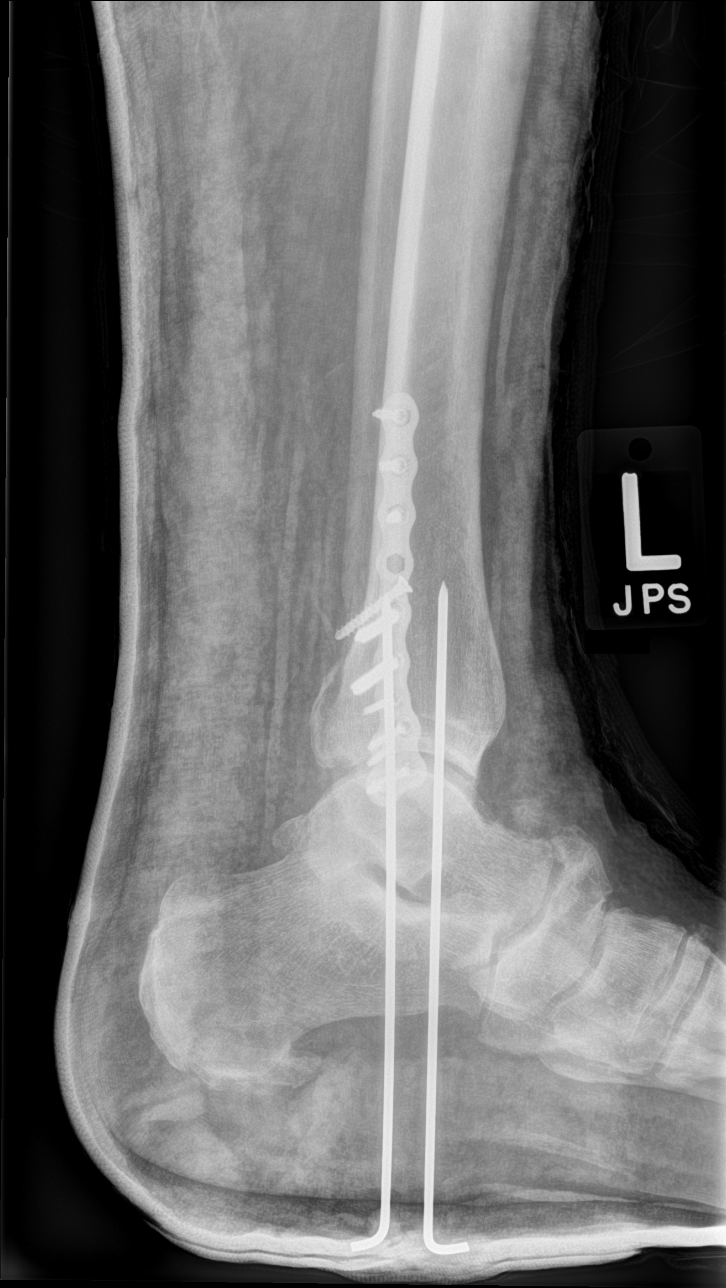

[ankle ap (2 of 2)]
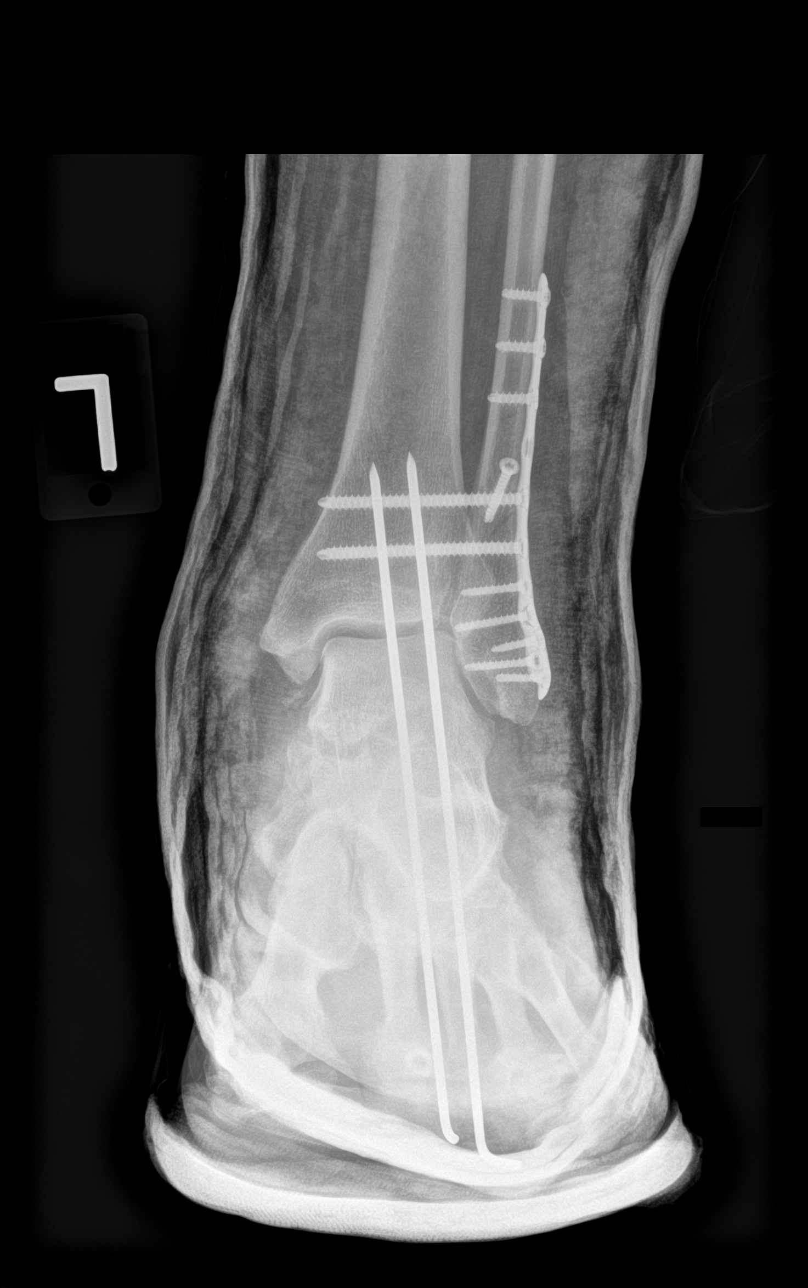

[4 of 4 positions shown; findings below may reference images not displayed]

FINDINGS: External cast noted. Prior relocation and ORIF of the left ankle.
Hardware intact. Anatomic alignment again noted.
IMPRESSION: Prior relocation and ORIF of the left ankle. Hardware intact.
Anatomic alignment again noted.

## 2021-02-20 ENCOUNTER — Encounter: Payer: Medicare HMO | Admitting: Physical Therapy

## 2021-02-20 ENCOUNTER — Other Ambulatory Visit: Payer: Self-pay

## 2021-02-20 ENCOUNTER — Ambulatory Visit: Payer: Self-pay

## 2021-02-20 ENCOUNTER — Ambulatory Visit: Payer: Medicare HMO | Admitting: Orthopedic Surgery

## 2021-02-20 ENCOUNTER — Encounter: Payer: Self-pay | Admitting: Orthopedic Surgery

## 2021-02-20 VITALS — BP 149/94 | HR 91 | Ht 74.0 in | Wt 360.0 lb

## 2021-02-20 DIAGNOSIS — M25322 Other instability, left elbow: Secondary | ICD-10-CM | POA: Diagnosis not present

## 2021-02-20 DIAGNOSIS — M25522 Pain in left elbow: Secondary | ICD-10-CM | POA: Diagnosis not present

## 2021-02-20 IMAGING — DX DG HIP (WITH OR WITHOUT PELVIS) 2-3V*L*
3 series · 3 of 3 positions shown · non-contrast
Comparison: None.

CLINICAL DATA: Fall 5 days ago, LEFT hip pain with bruising.

EXAM:
DG HIP (WITH OR WITHOUT PELVIS) 2-3V LEFT

[pelvis ap]
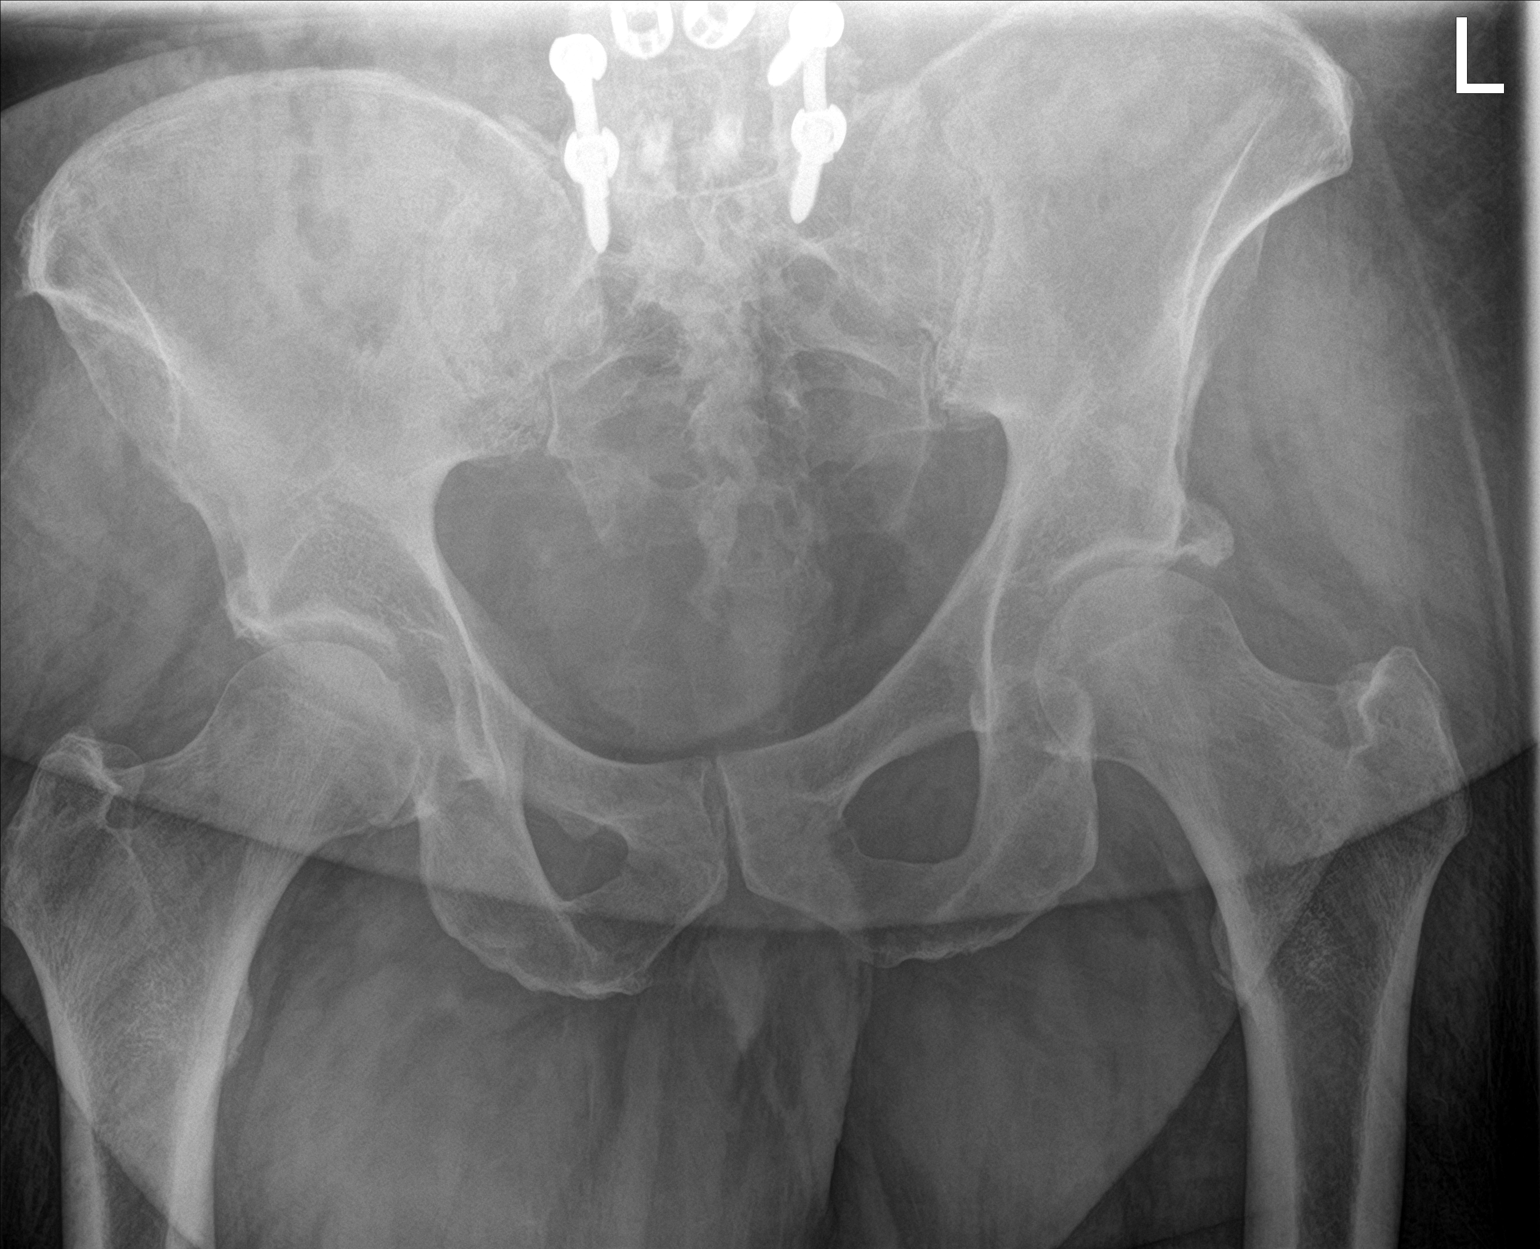

[hip ap]
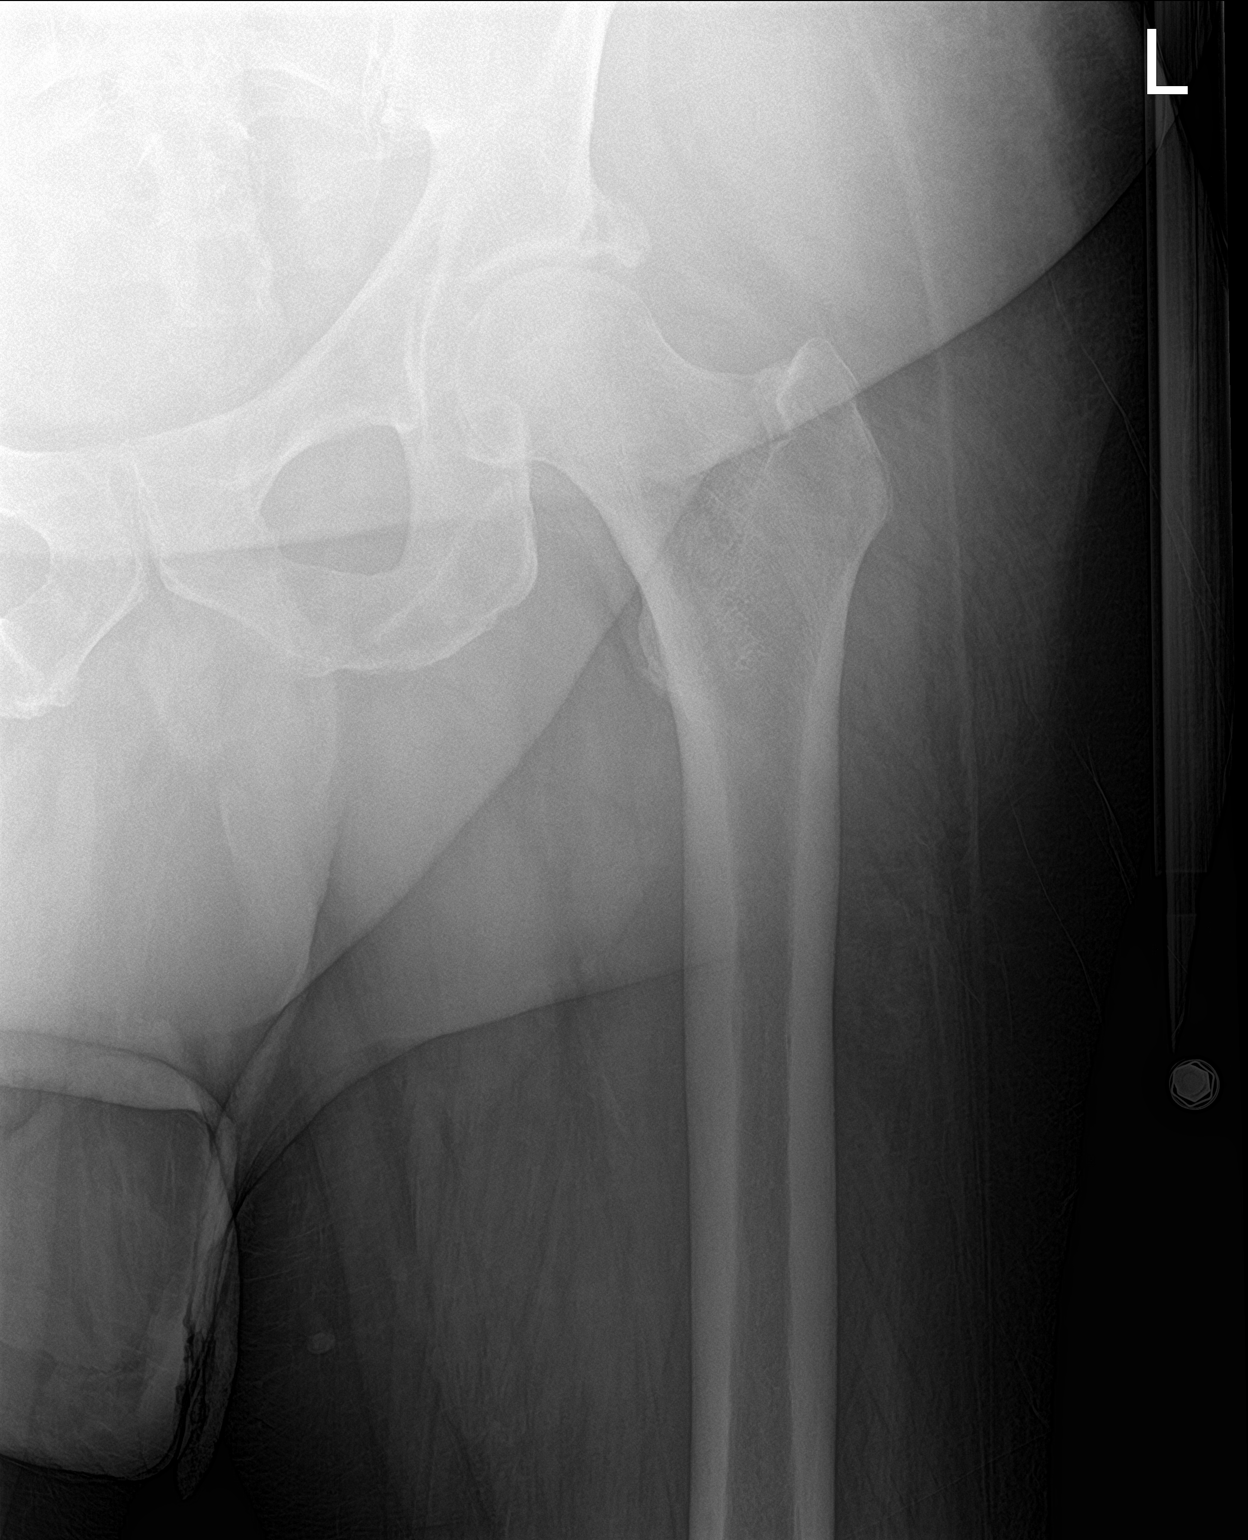

[hip lat]
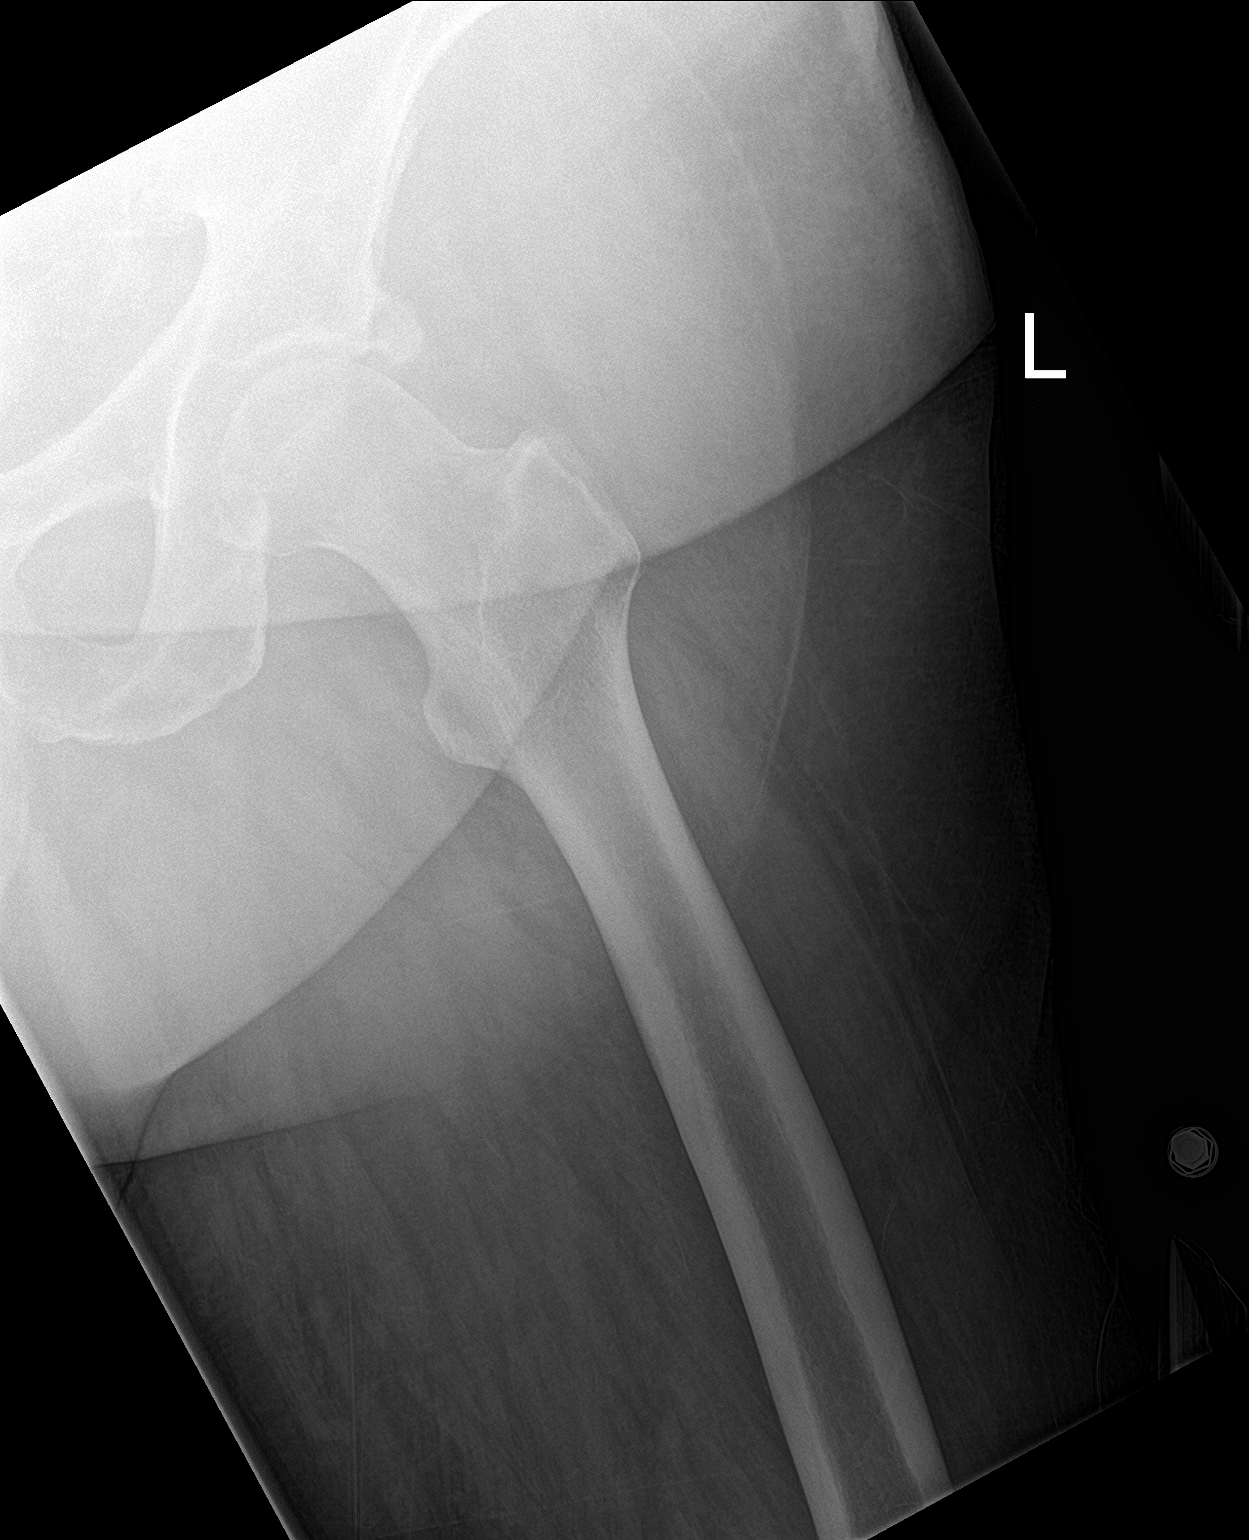

[3 of 3 positions shown; findings below may reference images not displayed]

FINDINGS: Single-view of the pelvis and two views of the LEFT hip are
provided. Osseous alignment is normal. No fracture line or displaced
fracture fragment is seen.

Degenerative spurring at the lateral margin of the LEFT acetabulum.
Soft tissues about the pelvis and LEFT hip are unremarkable.
IMPRESSION: No acute findings. No osseous fracture or dislocation.

## 2021-02-22 ENCOUNTER — Encounter: Payer: Medicare HMO | Admitting: Physical Therapy

## 2021-02-22 DIAGNOSIS — M25322 Other instability, left elbow: Secondary | ICD-10-CM | POA: Insufficient documentation

## 2021-02-22 HISTORY — DX: Other instability, left elbow: M25.322

## 2021-02-22 NOTE — H&P (View-Only) (Signed)
Office Visit Note   Patient: David Fitzgerald           Date of Birth: 04-08-1960           MRN: [id number] Visit Date: 02/20/2021              Requested by: Angelina Sheriff, MD [place],  [place] [address] PCP: Angelina Sheriff, MD   Assessment & Plan: Visit Diagnoses:  1. Pain in left elbow   2. Instability of left elbow joint     Plan: Discussed with patient and his family that he has a difficult problem regarding his left elbow.  He has continued instability with ulnar subluxation of the ulna and radius relative to the distal humerus.  Fortunately, there is no fracture of the radial head or coronoid process.  We discussed surgical treatment including lateral collateral ligament repair with possible medial collateral ligament repair and need for external fixation if elbow remains unstable.  We discussed the risks of surgery including bleeding, infection, damage to nearby neurovascular structures, recurrent instability, and need for additional surgery.  We also discussed the consequence of both the injury and the surgeries that he would likely have significant stiffness of this elbow and the elbow will never function the same as it did before the injury.  Surgery is further complicated by his size which makes in the soft tissue did section, retraction and structure repair more difficult.  We will plan on doing this likely next Friday with the assistance of Dr. Sammuel Hines given the complicated nature of the surgery.  All questions were answered.  We will also send him to have cardiac/anesthesia clearance given his multiple medical comorbidities and size.    We discussed that as the nature ofFollow-Up Instructions: No follow-ups on file.   Orders:  Orders Placed This Encounter  Procedures   XR Elbow 2 Views Left   No orders of the defined types were placed in this encounter.     Procedures: No procedures performed   Clinical Data: No additional  findings.   Subjective: Chief Complaint  Patient presents with   Left Elbow - Injury, Pain    States that he had a fall on 01/30/2021 and went to [facility] ED, they told him it was dislocated and they tried to put it back in several times and he asked them to stop due to the pain that he was in, then he saw Dr. Samule Dry who said that he doesn't do elbows, and wanted pt to go to [facility] and patient decided that he wanted to be seen here since he is already a patient, states that it pops out of place all the time, thinks the ED messed it up.     Is a 61 year old right-hand-dominant male who presents for follow-up of a left elbow injury.  This occurred approximately around 01/30/2021 at which point he fell onto his left arm.  He is seen in outside hospital where the elbow was reduced in the operating room.  He was given a hinged brace that is quite uncomfortable and slide off of his arm.  He notes that his elbow subluxes whenever he flexes the elbow.  There has not been any frank redislocation but the elbow does feel unstable to him.  He has mild to moderate discomfort with this.  He denies any numbness or paresthesias.  He does have a complicated past medical history including CAD, A. fib, type 2 diabetes, morbid  obesity and a history of a left BKA following complications of an open left ankle fracture.  Injury   Review of Systems   Objective: Vital Signs: BP (!) 149/94 (BP Location: Right Arm, Patient Position: Sitting)    Pulse 91    Ht 6\' 2"  (1.88 m)    Wt (!) 360 lb (163.3 kg)    BMI 46.22 kg/m   Physical Exam Constitutional:      Appearance: Normal appearance.  Cardiovascular:     Rate and Rhythm: Normal rate.     Pulses: Normal pulses.  Pulmonary:     Effort: Pulmonary effort is normal.  Skin:    General: Skin is warm and dry.     Capillary Refill: Capillary refill takes less than 2 seconds.  Neurological:     Mental Status: He is alert.    Left Elbow Exam    Tenderness  The patient is experiencing no tenderness.   Other  Erythema: absent Sensation: normal Pulse: present  Comments:  Very large arm with hinged brace on.  Mild to moderate discomfort with attempted ROM.  No pain w/ pronation/supination.  AIN/PIN/U motor function intact.      Specialty Comments:  No specialty comments available.  Imaging: No results found.   PMFS History: Patient Active Problem List   Diagnosis Date Noted   Instability of left elbow joint 02/22/2021   IBS (irritable bowel syndrome) 12/08/2020   Dyspnea on minimal exertion 12/08/2020   Epigastric pain 12/08/2020   Hematuria 12/08/2020   Nausea & vomiting 12/08/2020   Type II diabetes mellitus, uncontrolled 12/08/2020   Atrial fibrillation with rapid ventricular response ([place]) 12/08/2020   Abscess of bursa of left ankle 10/04/2020   Subacute osteomyelitis, left ankle and foot ([place])    Abscess of ankle    Asthma 07/31/2020   Depression 07/31/2020   Dysrhythmia 07/31/2020   GERD (gastroesophageal reflux disease) 07/31/2020   History of kidney stones 07/31/2020   Pneumonia 07/31/2020   Left ankle pain 04/05/2020   Open dislocation of left ankle    A-fib (HCC)    OSA (obstructive sleep apnea) 03/09/2020   Wound dehiscence, traumatic injury repair, left medial ankle  03/08/2020   Vitamin D deficiency 02/16/2020   Chronic, continuous use of opioids 02/15/2020   Open fracture dislocation of left ankle 01/31/2020   Ankle fracture 01/31/2020   Permanent atrial fibrillation ([place])    Hypertension    Diabetes ([place])    Bee sting allergy    Hypotension 12/25/2017   Chronic anticoagulation 03/07/2017   Coronary artery calcification seen on CT scan 02/07/2017   CAD in native artery 02/07/2017   Persistent atrial fibrillation ([place]) 02/06/2017   Anxiety 02/06/2017   Arthritis 02/06/2017   Pernicious anemia 02/06/2017   Allergy to pollen 04/06/2015   OSA treated with BiPAP 12/14/2014   Obesity  hypoventilation syndrome ([place]) 12/14/2014   Morbid obesity with BMI of 40.0-44.9, adult ([place]) 12/14/2014   Hypertensive heart disease 12/14/2014   Hyperlipidemia, mixed 12/14/2014   Type 2 diabetes mellitus with diabetic neuropathy ([place]) 12/14/2014   Chronic venous insufficiency 12/14/2014   Edema 12/14/2014   Morbid obesity ([place]) 12/14/2014   Bell's palsy 02/24/2013   Polyneuropathy in diabetes ([place]) 02/24/2013   Syndrome affecting cervical region 02/24/2013   Trigeminal neuralgia 02/24/2013   Lumbar pain with radiation down left leg 07/09/2012   BMI 45.0-49.9, adult ([place]) 07/09/2012   Chronic pain syndrome 07/09/2012   Morbid obesity with BMI of 45.0-49.9, adult ([place]) 07/09/2012  Pain syndrome, chronic 07/09/2012   Past Medical History:  Diagnosis Date   A-fib ([place])    Abscess of ankle    Abscess of bursa of left ankle 10/04/2020   Allergy to pollen 04/06/2015   Ankle fracture 01/31/2020   Anxiety 02/06/2017   Arthritis 02/06/2017   Asthma    as a child   Atrial fibrillation with rapid ventricular response ([place]) 12/08/2020   Bee sting allergy    wasp / mixed vespid   Bell's palsy 02/24/2013   BMI 45.0-49.9, adult ([place]) 07/09/2012   CAD in native artery 02/07/2017   Chronic anticoagulation 03/07/2017   Chronic pain syndrome 07/09/2012   Chronic venous insufficiency 12/14/2014   Chronic, continuous use of opioids 02/15/2020   Coronary artery calcification seen on CT scan 02/07/2017   Depression    Diabetes ([place])    Dyspnea on minimal exertion 12/08/2020   Dysrhythmia    afib   Edema 12/14/2014   Epigastric pain 12/08/2020   GERD (gastroesophageal reflux disease)    Hematuria 12/08/2020   History of kidney stones    Hyperlipidemia, mixed 12/14/2014   Hypertension    Hypertensive heart disease 12/14/2014   Hypotension 12/25/2017   IBS (irritable bowel syndrome)    Left ankle pain 04/05/2020   Lumbar pain with radiation down left leg 07/09/2012   Morbid obesity ([place]) 12/14/2014    Morbid obesity with BMI of 40.0-44.9, adult ([place]) 12/14/2014   Morbid obesity with BMI of 45.0-49.9, adult ([place]) 07/09/2012   Nausea & vomiting 12/08/2020   Obesity hypoventilation syndrome ([place]) 12/14/2014   Open dislocation of left ankle    Open fracture dislocation of left ankle 01/31/2020   OSA (obstructive sleep apnea) 03/09/2020   doesn't use a Cpap   OSA treated with BiPAP 12/14/2014   Pain syndrome, chronic 07/09/2012   Permanent atrial fibrillation (HCC)    Pernicious anemia 02/06/2017   Persistent atrial fibrillation ([place]) 02/06/2017   CHADS2vasc of 2   Pneumonia    Polyneuropathy in diabetes ([place]) 02/24/2013   Subacute osteomyelitis, left ankle and foot ([place])    Syndrome affecting cervical region 02/24/2013   Trigeminal neuralgia 02/24/2013   Type 2 diabetes mellitus with diabetic neuropathy ([place]) 12/14/2014   Type II diabetes mellitus, uncontrolled 12/08/2020   Vitamin D deficiency 02/16/2020   Wound dehiscence, traumatic injury repair, left medial ankle  03/08/2020    Family History  Problem Relation Age of Onset   Breast cancer Mother    Heart disease Father    CAD Father    Atrial fibrillation Father    Stomach cancer Paternal Aunt    Heart disease Paternal Uncle    Breast cancer Maternal Grandmother     Past Surgical History:  Procedure Laterality Date    INTERNAL FIXATION LEFT TIBIA TO CALCANEUS AND APPLY SKIN GRAFT (Left Ankle)  04/05/2020   ADENOIDECTOMY     AMPUTATION Left 10/04/2020   Procedure: LEFT BELOW KNEE AMPUTATION;  Surgeon: [name], MD;  Location: [place];  Service: Orthopedics;  Laterality: Left;   BACK SURGERY  2002 /2003   CARPAL TUNNEL RELEASE Bilateral    I & D EXTREMITY Left 01/31/2020   Procedure: IRRIGATION AND DEBRIDEMENT ANKLE;  Surgeon: Altamese [place], MD;  Location: [place];  Service: Orthopedics;  Laterality: Left;   I & D EXTREMITY Left 03/10/2020   Procedure: DEBRIDEMENT LEFT ANKLE, APPLY SKIN GRAFT;  Surgeon: [name], MD;   Location: [place];  Service: Orthopedics;  Laterality: Left;  KNEE SURGERY Left 2005   ORIF ANKLE FRACTURE Left 01/31/2020   ORIF ANKLE FRACTURE Left 01/31/2020   Procedure: OPEN REDUCTION INTERNAL FIXATION (ORIF) ANKLE FRACTURE;  Surgeon: Altamese , MD;  Location: [place];  Service: Orthopedics;  Laterality: Left;   ORIF ANKLE FRACTURE Left 04/05/2020   Procedure: INTERNAL FIXATION LEFT TIBIA TO CALCANEUS AND APPLY SKIN GRAFT;  Surgeon: [name], MD;  Location: [facility] [place];  Service: Orthopedics;  Laterality: Left;   SHOULDER SURGERY Left 1998   TONSILLECTOMY     Social History   Occupational History   Not on file  Tobacco Use   Smoking status: Former    Packs/day: 3.00    Years: 35.00    Pack years: 105.00    Types: Cigarettes    Quit date: 04/13/2006    Years since quitting: 14.8   Smokeless tobacco: Never  Vaping Use   Vaping Use: Never used  Substance and Sexual Activity   Alcohol use: No    Alcohol/week: 0.0 standard drinks   Drug use: No   Sexual activity: Not on file

## 2021-02-22 NOTE — Progress Notes (Signed)
Office Visit Note   Patient: David Fitzgerald           Date of Birth: Feb 21, 1960           MRN: [id number] Visit Date: 02/20/2021              Requested by: Angelina Sheriff, MD [place],  [place] [address] PCP: Angelina Sheriff, MD   Assessment & Plan: Visit Diagnoses:  1. Pain in left elbow   2. Instability of left elbow joint     Plan: Discussed with patient and his family that he has a difficult problem regarding his left elbow.  He has continued instability with ulnar subluxation of the ulna and radius relative to the distal humerus.  Fortunately, there is no fracture of the radial head or coronoid process.  We discussed surgical treatment including lateral collateral ligament repair with possible medial collateral ligament repair and need for external fixation if elbow remains unstable.  We discussed the risks of surgery including bleeding, infection, damage to nearby neurovascular structures, recurrent instability, and need for additional surgery.  We also discussed the consequence of both the injury and the surgeries that he would likely have significant stiffness of this elbow and the elbow will never function the same as it did before the injury.  Surgery is further complicated by his size which makes in the soft tissue did section, retraction and structure repair more difficult.  We will plan on doing this likely next Friday with the assistance of Dr. Sammuel Hines given the complicated nature of the surgery.  All questions were answered.  We will also send him to have cardiac/anesthesia clearance given his multiple medical comorbidities and size.    We discussed that as the nature ofFollow-Up Instructions: No follow-ups on file.   Orders:  Orders Placed This Encounter  Procedures   XR Elbow 2 Views Left   No orders of the defined types were placed in this encounter.     Procedures: No procedures performed   Clinical Data: No additional  findings.   Subjective: Chief Complaint  Patient presents with   Left Elbow - Injury, Pain    States that he had a fall on 01/30/2021 and went to [facility] ED, they told him it was dislocated and they tried to put it back in several times and he asked them to stop due to the pain that he was in, then he saw Dr. Samule Dry who said that he doesn't do elbows, and wanted pt to go to [facility] and patient decided that he wanted to be seen here since he is already a patient, states that it pops out of place all the time, thinks the ED messed it up.     Is a 61 year old right-hand-dominant male who presents for follow-up of a left elbow injury.  This occurred approximately around 01/30/2021 at which point he fell onto his left arm.  He is seen in outside hospital where the elbow was reduced in the operating room.  He was given a hinged brace that is quite uncomfortable and slide off of his arm.  He notes that his elbow subluxes whenever he flexes the elbow.  There has not been any frank redislocation but the elbow does feel unstable to him.  He has mild to moderate discomfort with this.  He denies any numbness or paresthesias.  He does have a complicated past medical history including CAD, A. fib, type 2 diabetes, morbid  obesity and a history of a left BKA following complications of an open left ankle fracture.  Injury   Review of Systems   Objective: Vital Signs: BP (!) 149/94 (BP Location: Right Arm, Patient Position: Sitting)    Pulse 91    Ht 6\' 2"  (1.88 m)    Wt (!) 360 lb (163.3 kg)    BMI 46.22 kg/m   Physical Exam Constitutional:      Appearance: Normal appearance.  Cardiovascular:     Rate and Rhythm: Normal rate.     Pulses: Normal pulses.  Pulmonary:     Effort: Pulmonary effort is normal.  Skin:    General: Skin is warm and dry.     Capillary Refill: Capillary refill takes less than 2 seconds.  Neurological:     Mental Status: He is alert.    Left Elbow Exam    Tenderness  The patient is experiencing no tenderness.   Other  Erythema: absent Sensation: normal Pulse: present  Comments:  Very large arm with hinged brace on.  Mild to moderate discomfort with attempted ROM.  No pain w/ pronation/supination.  AIN/PIN/U motor function intact.      Specialty Comments:  No specialty comments available.  Imaging: No results found.   PMFS History: Patient Active Problem List   Diagnosis Date Noted   Instability of left elbow joint 02/22/2021   IBS (irritable bowel syndrome) 12/08/2020   Dyspnea on minimal exertion 12/08/2020   Epigastric pain 12/08/2020   Hematuria 12/08/2020   Nausea & vomiting 12/08/2020   Type II diabetes mellitus, uncontrolled 12/08/2020   Atrial fibrillation with rapid ventricular response ([place]) 12/08/2020   Abscess of bursa of left ankle 10/04/2020   Subacute osteomyelitis, left ankle and foot ([place])    Abscess of ankle    Asthma 07/31/2020   Depression 07/31/2020   Dysrhythmia 07/31/2020   GERD (gastroesophageal reflux disease) 07/31/2020   History of kidney stones 07/31/2020   Pneumonia 07/31/2020   Left ankle pain 04/05/2020   Open dislocation of left ankle    A-fib (HCC)    OSA (obstructive sleep apnea) 03/09/2020   Wound dehiscence, traumatic injury repair, left medial ankle  03/08/2020   Vitamin D deficiency 02/16/2020   Chronic, continuous use of opioids 02/15/2020   Open fracture dislocation of left ankle 01/31/2020   Ankle fracture 01/31/2020   Permanent atrial fibrillation ([place])    Hypertension    Diabetes ([place])    Bee sting allergy    Hypotension 12/25/2017   Chronic anticoagulation 03/07/2017   Coronary artery calcification seen on CT scan 02/07/2017   CAD in native artery 02/07/2017   Persistent atrial fibrillation ([place]) 02/06/2017   Anxiety 02/06/2017   Arthritis 02/06/2017   Pernicious anemia 02/06/2017   Allergy to pollen 04/06/2015   OSA treated with BiPAP 12/14/2014   Obesity  hypoventilation syndrome ([place]) 12/14/2014   Morbid obesity with BMI of 40.0-44.9, adult ([place]) 12/14/2014   Hypertensive heart disease 12/14/2014   Hyperlipidemia, mixed 12/14/2014   Type 2 diabetes mellitus with diabetic neuropathy ([place]) 12/14/2014   Chronic venous insufficiency 12/14/2014   Edema 12/14/2014   Morbid obesity ([place]) 12/14/2014   Bell's palsy 02/24/2013   Polyneuropathy in diabetes ([place]) 02/24/2013   Syndrome affecting cervical region 02/24/2013   Trigeminal neuralgia 02/24/2013   Lumbar pain with radiation down left leg 07/09/2012   BMI 45.0-49.9, adult ([place]) 07/09/2012   Chronic pain syndrome 07/09/2012   Morbid obesity with BMI of 45.0-49.9, adult ([place]) 07/09/2012  Pain syndrome, chronic 07/09/2012   Past Medical History:  Diagnosis Date   A-fib ([place])    Abscess of ankle    Abscess of bursa of left ankle 10/04/2020   Allergy to pollen 04/06/2015   Ankle fracture 01/31/2020   Anxiety 02/06/2017   Arthritis 02/06/2017   Asthma    as a child   Atrial fibrillation with rapid ventricular response ([place]) 12/08/2020   Bee sting allergy    wasp / mixed vespid   Bell's palsy 02/24/2013   BMI 45.0-49.9, adult ([place]) 07/09/2012   CAD in native artery 02/07/2017   Chronic anticoagulation 03/07/2017   Chronic pain syndrome 07/09/2012   Chronic venous insufficiency 12/14/2014   Chronic, continuous use of opioids 02/15/2020   Coronary artery calcification seen on CT scan 02/07/2017   Depression    Diabetes ([place])    Dyspnea on minimal exertion 12/08/2020   Dysrhythmia    afib   Edema 12/14/2014   Epigastric pain 12/08/2020   GERD (gastroesophageal reflux disease)    Hematuria 12/08/2020   History of kidney stones    Hyperlipidemia, mixed 12/14/2014   Hypertension    Hypertensive heart disease 12/14/2014   Hypotension 12/25/2017   IBS (irritable bowel syndrome)    Left ankle pain 04/05/2020   Lumbar pain with radiation down left leg 07/09/2012   Morbid obesity ([place]) 12/14/2014    Morbid obesity with BMI of 40.0-44.9, adult ([place]) 12/14/2014   Morbid obesity with BMI of 45.0-49.9, adult ([place]) 07/09/2012   Nausea & vomiting 12/08/2020   Obesity hypoventilation syndrome ([place]) 12/14/2014   Open dislocation of left ankle    Open fracture dislocation of left ankle 01/31/2020   OSA (obstructive sleep apnea) 03/09/2020   doesn't use a Cpap   OSA treated with BiPAP 12/14/2014   Pain syndrome, chronic 07/09/2012   Permanent atrial fibrillation (HCC)    Pernicious anemia 02/06/2017   Persistent atrial fibrillation ([place]) 02/06/2017   CHADS2vasc of 2   Pneumonia    Polyneuropathy in diabetes ([place]) 02/24/2013   Subacute osteomyelitis, left ankle and foot ([place])    Syndrome affecting cervical region 02/24/2013   Trigeminal neuralgia 02/24/2013   Type 2 diabetes mellitus with diabetic neuropathy ([place]) 12/14/2014   Type II diabetes mellitus, uncontrolled 12/08/2020   Vitamin D deficiency 02/16/2020   Wound dehiscence, traumatic injury repair, left medial ankle  03/08/2020    Family History  Problem Relation Age of Onset   Breast cancer Mother    Heart disease Father    CAD Father    Atrial fibrillation Father    Stomach cancer Paternal Aunt    Heart disease Paternal Uncle    Breast cancer Maternal Grandmother     Past Surgical History:  Procedure Laterality Date    INTERNAL FIXATION LEFT TIBIA TO CALCANEUS AND APPLY SKIN GRAFT (Left Ankle)  04/05/2020   ADENOIDECTOMY     AMPUTATION Left 10/04/2020   Procedure: LEFT BELOW KNEE AMPUTATION;  Surgeon: [name], MD;  Location: [facility];  Service: Orthopedics;  Laterality: Left;   BACK SURGERY  2002 /2003   CARPAL TUNNEL RELEASE Bilateral    I & D EXTREMITY Left 01/31/2020   Procedure: IRRIGATION AND DEBRIDEMENT ANKLE;  Surgeon: Altamese [place], MD;  Location: [place];  Service: Orthopedics;  Laterality: Left;   I & D EXTREMITY Left 03/10/2020   Procedure: DEBRIDEMENT LEFT ANKLE, APPLY SKIN GRAFT;  Surgeon: [name], MD;   Location: [facility];  Service: Orthopedics;  Laterality: Left;  KNEE SURGERY Left 2005   ORIF ANKLE FRACTURE Left 01/31/2020   ORIF ANKLE FRACTURE Left 01/31/2020   Procedure: OPEN REDUCTION INTERNAL FIXATION (ORIF) ANKLE FRACTURE;  Surgeon: Altamese [place], MD;  Location: [facility];  Service: Orthopedics;  Laterality: Left;   ORIF ANKLE FRACTURE Left 04/05/2020   Procedure: INTERNAL FIXATION LEFT TIBIA TO CALCANEUS AND APPLY SKIN GRAFT;  Surgeon: [name], MD;  Location: [place];  Service: Orthopedics;  Laterality: Left;   SHOULDER SURGERY Left 1998   TONSILLECTOMY     Social History   Occupational History   Not on file  Tobacco Use   Smoking status: Former    Packs/day: 3.00    Years: 35.00    Pack years: 105.00    Types: Cigarettes    Quit date: 04/13/2006    Years since quitting: 14.8   Smokeless tobacco: Never  Vaping Use   Vaping Use: Never used  Substance and Sexual Activity   Alcohol use: No    Alcohol/week: 0.0 standard drinks   Drug use: No   Sexual activity: Not on file

## 2021-02-27 ENCOUNTER — Encounter: Payer: Medicare HMO | Admitting: Physical Therapy

## 2021-03-01 ENCOUNTER — Encounter (HOSPITAL_COMMUNITY): Payer: Self-pay | Admitting: Orthopedic Surgery

## 2021-03-01 ENCOUNTER — Encounter: Payer: Medicare HMO | Admitting: Physical Therapy

## 2021-03-01 ENCOUNTER — Other Ambulatory Visit: Payer: Self-pay

## 2021-03-01 NOTE — Progress Notes (Addendum)
[name] denies chest pain or shortness of breath. Patient denies having any s/s of Covid in his household.  Patient denies any known exposure to Covid.   [name] has type II diabetes, patient states that last A1C was 6.2, patient does not check CBG.I encouraged Mr.David Fitzgerald to eat a snack before bedtime.  PCP is Dr. Lovette Fitzgerald Cardiologist is Dr. Bettina Fitzgerald.  I instructed [name] to wash up well with antibiotic soap, if it is available.  Dry off with a clean towel. Do not put lotion, powder, cologne or deodorant or makeup.No jewelry or piercings. Men may shave their face and neck. Woman should not shave. No nail polish, artificial or acrylic nails. Wear clean clothes, brush your teeth. Glasses, contact lens,dentures or partials may not be worn in the OR. If you need to wear them, please bring a case for glasses, do not wear contacts or bring a case, the hospital does not have contact cases, dentures or partials will have to be removed , make sure they are clean, we will provide a denture cup to put them in. You will need some one to drive you home and a responsible person over the age of 37 to stay with you for the first 24 hours after surgery.   [name] states he was not given instructions regarding Eliquis, I told him I would call the office and ask someone to reach out to him. I spoke to [facility] , asked or orders and ask hat Eliquis be addressed with patient.

## 2021-03-02 ENCOUNTER — Ambulatory Visit (HOSPITAL_COMMUNITY): Payer: Medicare HMO | Admitting: Anesthesiology

## 2021-03-02 ENCOUNTER — Encounter (HOSPITAL_COMMUNITY): Payer: Self-pay | Admitting: Orthopedic Surgery

## 2021-03-02 ENCOUNTER — Encounter (HOSPITAL_COMMUNITY)
Admission: RE | Disposition: A | Discharge status: Home / Self Care | Payer: Self-pay | Source: Home / Self Care | Attending: Orthopedic Surgery

## 2021-03-02 ENCOUNTER — Ambulatory Visit (HOSPITAL_COMMUNITY)
Admission: RE | Admit: 2021-03-02 | Discharge: 2021-03-02 | Disposition: A | Discharge status: Home / Self Care | Payer: Medicare HMO | Attending: Orthopedic Surgery | Admitting: Orthopedic Surgery

## 2021-03-02 ENCOUNTER — Ambulatory Visit (HOSPITAL_COMMUNITY): Payer: Medicare HMO

## 2021-03-02 ENCOUNTER — Other Ambulatory Visit: Payer: Self-pay

## 2021-03-02 DIAGNOSIS — Z7901 Long term (current) use of anticoagulants: Secondary | ICD-10-CM | POA: Diagnosis not present

## 2021-03-02 DIAGNOSIS — Z419 Encounter for procedure for purposes other than remedying health state, unspecified: Secondary | ICD-10-CM

## 2021-03-02 DIAGNOSIS — Z9989 Dependence on other enabling machines and devices: Secondary | ICD-10-CM | POA: Diagnosis not present

## 2021-03-02 DIAGNOSIS — E1142 Type 2 diabetes mellitus with diabetic polyneuropathy: Secondary | ICD-10-CM | POA: Diagnosis not present

## 2021-03-02 DIAGNOSIS — S53105A Unspecified dislocation of left ulnohumeral joint, initial encounter: Secondary | ICD-10-CM | POA: Diagnosis not present

## 2021-03-02 DIAGNOSIS — I2510 Atherosclerotic heart disease of native coronary artery without angina pectoris: Secondary | ICD-10-CM | POA: Insufficient documentation

## 2021-03-02 DIAGNOSIS — I119 Hypertensive heart disease without heart failure: Secondary | ICD-10-CM | POA: Diagnosis not present

## 2021-03-02 DIAGNOSIS — G8918 Other acute postprocedural pain: Secondary | ICD-10-CM | POA: Diagnosis not present

## 2021-03-02 DIAGNOSIS — Z87891 Personal history of nicotine dependence: Secondary | ICD-10-CM | POA: Diagnosis not present

## 2021-03-02 DIAGNOSIS — I4821 Permanent atrial fibrillation: Secondary | ICD-10-CM | POA: Insufficient documentation

## 2021-03-02 DIAGNOSIS — G4733 Obstructive sleep apnea (adult) (pediatric): Secondary | ICD-10-CM | POA: Diagnosis not present

## 2021-03-02 DIAGNOSIS — Z89512 Acquired absence of left leg below knee: Secondary | ICD-10-CM | POA: Diagnosis not present

## 2021-03-02 DIAGNOSIS — M25322 Other instability, left elbow: Secondary | ICD-10-CM | POA: Insufficient documentation

## 2021-03-02 DIAGNOSIS — Z9889 Other specified postprocedural states: Secondary | ICD-10-CM | POA: Diagnosis not present

## 2021-03-02 HISTORY — PX: ELBOW LIGAMENT RECONSTRUCTION: SHX6359

## 2021-03-02 HISTORY — DX: Heart failure, unspecified: I50.9

## 2021-03-02 LAB — SURGICAL PCR SCREEN
MRSA, PCR: NEGATIVE
Staphylococcus aureus: NEGATIVE

## 2021-03-02 LAB — BASIC METABOLIC PANEL
Anion gap: 9 (ref 5–15)
BUN: 14 mg/dL (ref 8–23)
CO2: 31 mmol/L (ref 22–32)
Calcium: 9.4 mg/dL (ref 8.9–10.3)
Chloride: 99 mmol/L (ref 98–111)
Creatinine, Ser: 0.94 mg/dL (ref 0.61–1.24)
GFR, Estimated: >60 mL/min (ref >60)
Glucose, Bld: 146 mg/dL — ABNORMAL HIGH (ref 70–99)
Potassium: 3.5 mmol/L (ref 3.5–5.1)
Sodium: 139 mmol/L (ref 135–145)

## 2021-03-02 LAB — CBC
HCT: 46.6 % (ref 39.0–52.0)
Hemoglobin: 15.4 g/dL (ref 13.0–17.0)
MCH: 30 pg (ref 26.0–34.0)
MCHC: 33 g/dL (ref 30.0–36.0)
MCV: 90.7 fL (ref 80.0–100.0)
Platelets: 246 10*3/uL (ref 150–400)
RBC: 5.14 MIL/uL (ref 4.22–5.81)
RDW: 16.1 % — ABNORMAL HIGH (ref 11.5–15.5)
WBC: 11 10*3/uL — ABNORMAL HIGH (ref 4.0–10.5)
nRBC: 0 % (ref 0.0–0.2)

## 2021-03-02 LAB — GLUCOSE, CAPILLARY
Glucose-Capillary: 165 mg/dL — ABNORMAL HIGH (ref 70–99)
Glucose-Capillary: 165 mg/dL — ABNORMAL HIGH (ref 70–99)
Glucose-Capillary: 190 mg/dL — ABNORMAL HIGH (ref 70–99)

## 2021-03-02 SURGERY — RECONSTRUCTION, LIGAMENT, ELBOW
Anesthesia: Regional | Site: Elbow | Laterality: Left

## 2021-03-02 MED ORDER — LIDOCAINE 2% (20 MG/ML) 5 ML SYRINGE
INTRAMUSCULAR | Status: AC
  Filled 2021-03-02: qty 5

## 2021-03-02 MED ORDER — 0.9 % SODIUM CHLORIDE (POUR BTL) OPTIME
TOPICAL | Status: DC | PRN
  Administered 2021-03-02: 1000 mL

## 2021-03-02 MED ORDER — OXYCODONE HCL 5 MG PO TABS
5.0000 mg | ORAL_TABLET | ORAL | 0 refills | Status: AC | PRN
Start: 2021-03-02 — End: 2021-03-09

## 2021-03-02 MED ORDER — DEXAMETHASONE SODIUM PHOSPHATE 10 MG/ML IJ SOLN
INTRAMUSCULAR | Status: AC
  Filled 2021-03-02: qty 1

## 2021-03-02 MED ORDER — METOPROLOL TARTRATE 5 MG/5ML IV SOLN
10.0000 mg | Freq: Once | INTRAVENOUS | Status: AC
Start: 2021-03-02 — End: 2021-03-02
  Administered 2021-03-02: 10 mg via INTRAVENOUS

## 2021-03-02 MED ORDER — METOPROLOL TARTRATE 5 MG/5ML IV SOLN
INTRAVENOUS | Status: AC
  Filled 2021-03-02: qty 5

## 2021-03-02 MED ORDER — FENTANYL CITRATE (PF) 250 MCG/5ML IJ SOLN
INTRAMUSCULAR | Status: AC
  Filled 2021-03-02: qty 5

## 2021-03-02 MED ORDER — BUPIVACAINE HCL (PF) 0.25 % IJ SOLN
INTRAMUSCULAR | Status: AC
  Filled 2021-03-02: qty 30

## 2021-03-02 MED ORDER — PROPOFOL 10 MG/ML IV BOLUS
INTRAVENOUS | Status: AC
  Filled 2021-03-02: qty 20

## 2021-03-02 MED ORDER — DEXAMETHASONE SODIUM PHOSPHATE 10 MG/ML IJ SOLN
INTRAMUSCULAR | Status: DC | PRN
  Administered 2021-03-02: 4 mg via INTRAVENOUS

## 2021-03-02 MED ORDER — MEPERIDINE HCL 25 MG/ML IJ SOLN: 6.2500 mg | INTRAMUSCULAR | Status: DC | PRN

## 2021-03-02 MED ORDER — PHENYLEPHRINE 40 MCG/ML (10ML) SYRINGE FOR IV PUSH (FOR BLOOD PRESSURE SUPPORT)
PREFILLED_SYRINGE | INTRAVENOUS | Status: AC
  Filled 2021-03-02: qty 10

## 2021-03-02 MED ORDER — ONDANSETRON HCL 4 MG/2ML IJ SOLN: 4.0000 mg | Freq: Once | INTRAMUSCULAR | Status: DC | PRN

## 2021-03-02 MED ORDER — PROPOFOL 10 MG/ML IV BOLUS
INTRAVENOUS | Status: DC | PRN
  Administered 2021-03-02: 200 mg via INTRAVENOUS

## 2021-03-02 MED ORDER — CEFAZOLIN IN SODIUM CHLORIDE 3-0.9 GM/100ML-% IV SOLN
3.0000 g | INTRAVENOUS | Status: AC
  Administered 2021-03-02: 3 g via INTRAVENOUS
  Filled 2021-03-02: qty 100

## 2021-03-02 MED ORDER — VANCOMYCIN HCL 1000 MG IV SOLR
INTRAVENOUS | Status: AC
  Filled 2021-03-02: qty 20

## 2021-03-02 MED ORDER — ACETAMINOPHEN 325 MG PO TABS: 325.0000 mg | ORAL_TABLET | ORAL | Status: DC | PRN

## 2021-03-02 MED ORDER — PHENYLEPHRINE 40 MCG/ML (10ML) SYRINGE FOR IV PUSH (FOR BLOOD PRESSURE SUPPORT)
PREFILLED_SYRINGE | INTRAVENOUS | Status: DC | PRN
  Administered 2021-03-02: 40 ug via INTRAVENOUS
  Administered 2021-03-02: 80 ug via INTRAVENOUS
  Administered 2021-03-02: 120 ug via INTRAVENOUS
  Administered 2021-03-02 (×2): 80 ug via INTRAVENOUS

## 2021-03-02 MED ORDER — KETOROLAC TROMETHAMINE 30 MG/ML IJ SOLN
INTRAMUSCULAR | Status: AC
  Filled 2021-03-02: qty 1

## 2021-03-02 MED ORDER — ORAL CARE MOUTH RINSE: 15.0000 mL | Freq: Once | OROMUCOSAL | Status: AC

## 2021-03-02 MED ORDER — MIDAZOLAM HCL 2 MG/2ML IJ SOLN
INTRAMUSCULAR | Status: AC
  Administered 2021-03-02: 2 mg via INTRAVENOUS
  Filled 2021-03-02: qty 2

## 2021-03-02 MED ORDER — BUPIVACAINE-EPINEPHRINE (PF) 0.5% -1:200000 IJ SOLN
INTRAMUSCULAR | Status: DC | PRN
Start: 2021-03-02 — End: 2021-03-02
  Administered 2021-03-02: 20 mL via PERINEURAL

## 2021-03-02 MED ORDER — MIDAZOLAM HCL 2 MG/2ML IJ SOLN
INTRAMUSCULAR | Status: AC
  Filled 2021-03-02: qty 2

## 2021-03-02 MED ORDER — DEXMEDETOMIDINE (PRECEDEX) IN NS 20 MCG/5ML (4 MCG/ML) IV SYRINGE
PREFILLED_SYRINGE | INTRAVENOUS | Status: AC
  Filled 2021-03-02: qty 5

## 2021-03-02 MED ORDER — FENTANYL CITRATE (PF) 100 MCG/2ML IJ SOLN: 25.0000 ug | INTRAMUSCULAR | Status: DC | PRN

## 2021-03-02 MED ORDER — CHLORHEXIDINE GLUCONATE 0.12 % MT SOLN
15.0000 mL | Freq: Once | OROMUCOSAL | Status: AC
  Administered 2021-03-02: 15 mL via OROMUCOSAL
  Filled 2021-03-02: qty 15

## 2021-03-02 MED ORDER — BUPIVACAINE LIPOSOME 1.3 % IJ SUSP
INTRAMUSCULAR | Status: DC | PRN
Start: 2021-03-02 — End: 2021-03-02
  Administered 2021-03-02: 10 mL via PERINEURAL

## 2021-03-02 MED ORDER — LACTATED RINGERS IV SOLN: INTRAVENOUS | Status: DC

## 2021-03-02 MED ORDER — FENTANYL CITRATE (PF) 100 MCG/2ML IJ SOLN: 100.0000 ug | Freq: Once | INTRAMUSCULAR | Status: AC

## 2021-03-02 MED ORDER — ACETAMINOPHEN 160 MG/5ML PO SOLN: 325.0000 mg | ORAL | Status: DC | PRN

## 2021-03-02 MED ORDER — PHENYLEPHRINE HCL-NACL 20-0.9 MG/250ML-% IV SOLN
INTRAVENOUS | Status: DC | PRN
Start: 2021-03-02 — End: 2021-03-02
  Administered 2021-03-02: 50 ug/min via INTRAVENOUS

## 2021-03-02 MED ORDER — MIDAZOLAM HCL 2 MG/2ML IJ SOLN: 2.0000 mg | Freq: Once | INTRAMUSCULAR | Status: AC

## 2021-03-02 MED ORDER — FENTANYL CITRATE (PF) 100 MCG/2ML IJ SOLN
INTRAMUSCULAR | Status: AC
  Administered 2021-03-02: 100 ug via INTRAVENOUS
  Filled 2021-03-02: qty 2

## 2021-03-02 MED ORDER — ONDANSETRON HCL 4 MG/2ML IJ SOLN
INTRAMUSCULAR | Status: AC
  Filled 2021-03-02: qty 2

## 2021-03-02 MED ORDER — OXYCODONE HCL 5 MG PO TABS: 5.0000 mg | ORAL_TABLET | Freq: Once | ORAL | Status: DC | PRN

## 2021-03-02 MED ORDER — OXYCODONE HCL 5 MG/5ML PO SOLN: 5.0000 mg | Freq: Once | ORAL | Status: DC | PRN

## 2021-03-02 MED ORDER — VANCOMYCIN HCL 1000 MG IV SOLR
INTRAVENOUS | Status: DC | PRN
  Administered 2021-03-02: 1000 mg via TOPICAL

## 2021-03-02 SURGICAL SUPPLY — 52 items
ANCH SUT KNTLS STRL SHLDR SYS (Anchor) ×1 IMPLANT
ANCHOR SUT QUATTRO KNTLS 4.5 (Anchor) ×1 IMPLANT
BAG COUNTER SPONGE SURGICOUNT (BAG) ×2 IMPLANT
BAG SPNG CNTER NS LX DISP (BAG) ×1
BIT DRILL 4.5 F/KNOTLESS ANCHR (DRILL) IMPLANT
BNDG CMPR 9X4 STRL LF SNTH (GAUZE/BANDAGES/DRESSINGS) ×1
BNDG CMPR MED 15X6 ELC VLCR LF (GAUZE/BANDAGES/DRESSINGS) ×1
BNDG ELASTIC 4X5.8 VLCR STR LF (GAUZE/BANDAGES/DRESSINGS) ×1 IMPLANT
BNDG ELASTIC 6X15 VLCR STRL LF (GAUZE/BANDAGES/DRESSINGS) ×1 IMPLANT
BNDG ESMARK 4X9 LF (GAUZE/BANDAGES/DRESSINGS) ×2 IMPLANT
BNDG GAUZE ELAST 4 BULKY (GAUZE/BANDAGES/DRESSINGS) ×1 IMPLANT
CORD BIPOLAR FORCEPS 12FT (ELECTRODE) ×2 IMPLANT
DRAPE SURG 17X23 STRL (DRAPES) ×2 IMPLANT
DRAPE U-SHAPE 47X51 STRL (DRAPES) ×2 IMPLANT
DRILL 4.5 F/KNOTLESS ANCHOR (DRILL) ×2
DRSG XEROFORM 1X8 (GAUZE/BANDAGES/DRESSINGS) ×1 IMPLANT
ELECT REM PT RETURN 9FT ADLT (ELECTROSURGICAL) ×2
ELECTRODE REM PT RTRN 9FT ADLT (ELECTROSURGICAL) ×1 IMPLANT
GLOVE SURG ORTHO LTX SZ8 (GLOVE) ×2 IMPLANT
GLOVE SURG UNDER POLY LF SZ8.5 (GLOVE) ×2 IMPLANT
GOWN STRL REUS W/ TWL LRG LVL3 (GOWN DISPOSABLE) ×1 IMPLANT
GOWN STRL REUS W/ TWL XL LVL3 (GOWN DISPOSABLE) ×1 IMPLANT
GOWN STRL REUS W/TWL LRG LVL3 (GOWN DISPOSABLE) ×2
GOWN STRL REUS W/TWL XL LVL3 (GOWN DISPOSABLE) ×6
KIT BASIN OR (CUSTOM PROCEDURE TRAY) ×2 IMPLANT
KIT ROOT REPAIR MEINISCAL PEEK (Anchor) IMPLANT
KIT TRANSTIBIAL (DISPOSABLE) IMPLANT
KIT TURNOVER KIT B (KITS) ×2 IMPLANT
MANIFOLD NEPTUNE II (INSTRUMENTS) ×2 IMPLANT
MEINISCAL ROOT REPAIR KIT PEEK (Anchor) IMPLANT
NDL MAYO TROCAR (NEEDLE) IMPLANT
NEEDLE MAYO TROCAR (NEEDLE) ×2 IMPLANT
NS IRRIG 1000ML POUR BTL (IV SOLUTION) ×2 IMPLANT
PACK ORTHO EXTREMITY (CUSTOM PROCEDURE TRAY) ×2 IMPLANT
PAD ARMBOARD 7.5X6 YLW CONV (MISCELLANEOUS) ×4 IMPLANT
PAD CAST 4YDX4 CTTN HI CHSV (CAST SUPPLIES) ×2 IMPLANT
PADDING CAST COTTON 4X4 STRL (CAST SUPPLIES) ×4
SOAP 2 % CHG 4 OZ (WOUND CARE) ×2 IMPLANT
SPLINT PLASTER CAST XFAST 5X30 (CAST SUPPLIES) IMPLANT
SPLINT PLASTER XFAST SET 5X30 (CAST SUPPLIES) ×1
SPONGE T-LAP 4X18 ~~LOC~~+RFID (SPONGE) ×2 IMPLANT
SUT BROADBAND TAPE 2PK 1.5 (SUTURE) ×2 IMPLANT
SUT ETHILON 3 0 FSL (SUTURE) ×1 IMPLANT
SUT MAXBRAID (SUTURE) ×1 IMPLANT
SUT VIC AB 1 CT1 27 (SUTURE) ×2
SUT VIC AB 1 CT1 27XBRD ANBCTR (SUTURE) IMPLANT
SUT VIC AB 2-0 CT1 36 (SUTURE) ×1 IMPLANT
TOWEL GREEN STERILE (TOWEL DISPOSABLE) ×2 IMPLANT
TOWEL GREEN STERILE FF (TOWEL DISPOSABLE) ×2 IMPLANT
TUBE CONNECTING 12X1/4 (SUCTIONS) ×2 IMPLANT
UNDERPAD 30X36 HEAVY ABSORB (UNDERPADS AND DIAPERS) ×2 IMPLANT
WATER STERILE IRR 1000ML POUR (IV SOLUTION) ×2 IMPLANT

## 2021-03-02 NOTE — Progress Notes (Addendum)
Dr. Tempie Donning notified of missing laterality on consent. Arm verified with patient, posting and surgeon. Verbal order received to add "left" to consent form.   Jacqlyn Larsen, RN

## 2021-03-02 NOTE — Anesthesia Preprocedure Evaluation (Addendum)
Anesthesia Evaluation  Patient identified by MRN, date of birth, ID band Patient awake    Reviewed: Allergy & Precautions, H&P , NPO status , Patient's Chart, lab work & pertinent test results  Airway Mallampati: III  TM Distance: >3 FB Neck ROM: Full    Dental  (+) Edentulous Upper, Edentulous Lower   Pulmonary asthma , sleep apnea (noncompliant w/ bipap) and Continuous Positive Airway Pressure Ventilation , former smoker,  Quit smoking 2008, 105 pack year history    Pulmonary exam normal breath sounds clear to auscultation       Cardiovascular hypertension, Pt. on medications and Pt. on home beta blockers + CAD  Normal cardiovascular exam+ dysrhythmias (last eliquis this AM) Atrial Fibrillation  Rhythm:Regular Rate:Normal  08/18/20 ECHO 1. Atrial fibrillation   Left ventricular ejection fraction, by estimation, is 40 to 45%. The  left ventricle has mild to moderately decreased function. Left ventricular  endocardial border not optimally defined to evaluate regional wall motion.  There is moderate concentric  left ventricular hypertrophy. Left ventricular diastolic parameters are  indeterminate.  2. Right ventricular systolic function is normal. The right ventricular  size is normal. There is normal pulmonary artery systolic pressure.  3. The mitral valve is normal in structure. No evidence of mitral valve  regurgitation. No evidence of mitral stenosis.  4. The aortic valve was not well visualized. Aortic valve regurgitation  is not visualized. No aortic stenosis is present.    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)    GI/Hepatic Neg liver ROS, GERD  Medicated and Controlled,  Endo/Other  diabetes, Well Controlled, Type 2, Oral Hypoglycemic AgentsMorbid obesityBMI 47   Renal/GU negative Renal ROS  negative genitourinary   Musculoskeletal  (+)  Arthritis , Osteoarthritis,   chronic pain    Abdominal   Peds negative pediatric ROS (+)  Hematology negative hematology ROS (+) Blood dyscrasia, anemia ,   Anesthesia Other Findings   Reproductive/Obstetrics negative OB ROS                                                                                       Anesthesia Quick Evaluation  Anesthesia Physical  Anesthesia Plan  ASA: 4  Anesthesia Plan: General and Regional   Post-op Pain Management: Regional block   Induction: Intravenous  PONV Risk Score and Plan: 2 and Ondansetron, Dexamethasone, Midazolam and Treatment may vary due to age or medical condition  Airway Management Planned: LMA  Additional Equipment: None  Intra-op Plan:   Post-operative Plan: Extubation in OR  Informed Consent: I have reviewed the patients History and Physical, chart, labs and discussed the procedure including the risks, benefits and alternatives for the proposed anesthesia with the patient or authorized representative who has indicated his/her understanding and acceptance.     Dental advisory given  Plan Discussed with: CRNA, Anesthesiologist and Surgeon  Anesthesia Plan Comments:        Anesthesia Quick Evaluation

## 2021-03-02 NOTE — Anesthesia Procedure Notes (Addendum)
Procedure Name: LMA Insertion Date/Time: 03/02/2021 1:16 PM Performed by: Jenne Campus, CRNA Pre-anesthesia Checklist: Patient identified, Emergency Drugs available, Suction available and Patient being monitored Patient Re-evaluated:Patient Re-evaluated prior to induction Oxygen Delivery Method: Circle System Utilized Preoxygenation: Pre-oxygenation with 100% oxygen Induction Type: IV induction Ventilation: Mask ventilation without difficulty LMA: LMA inserted LMA Size: 4.0 Number of attempts: 1 Placement Confirmation: positive ETCO2 and breath sounds checked- equal and bilateral Tube secured with: Tape Dental Injury: Teeth and Oropharynx as per pre-operative assessment

## 2021-03-02 NOTE — Brief Op Note (Signed)
03/02/2021  3:20 PM  PATIENT:  David Fitzgerald  61 y.o. male  PRE-OPERATIVE DIAGNOSIS:  Left elbow joint instability  POST-OPERATIVE DIAGNOSIS:  Left elbow joint instability  PROCEDURE:  Procedure(s): LEFT ELBOW LATERAL ULNAR COLLATERAL LIGAMENT REPAIR/ POSSIBLE MEDIAL LIGAMENT REPAIR (Left)  SURGEON:  Surgeon(s) and Role:    * Sherilyn Cooter, MD - Primary    * Vanetta Mulders, MD  PHYSICIAN ASSISTANT:   ASSISTANTS: none   ANESTHESIA:   regional and general  EBL:  100 mL   BLOOD ADMINISTERED:none  DRAINS: none   LOCAL MEDICATIONS USED:  NONE  SPECIMEN:  No Specimen  DISPOSITION OF SPECIMEN:  N/A  COUNTS:  YES  TOURNIQUET:  * Missing tourniquet times found for documented tourniquets in log: [id number] *  DICTATION: .Dragon Dictation  PLAN OF CARE: Discharge to home after PACU  PATIENT DISPOSITION:  PACU - hemodynamically stable.   Delay start of Pharmacological VTE agent (>24hrs) due to surgical blood loss or risk of bleeding: not applicable

## 2021-03-02 NOTE — Interval H&P Note (Signed)
History and Physical Interval Note:  03/02/2021 12:57 PM  David Fitzgerald  has presented today for surgery, with the diagnosis of Left elbow joint instability.  The various methods of treatment have been discussed with the patient and family. After consideration of risks, benefits and other options for treatment, the patient has consented to  Procedure(s): LEFT ELBOW LATERAL ULNAR COLLATERAL LIGAMENT REPAIR/ POSSIBLE MEDIAL LIGAMENT REPAIR/ POSSIBLE EXTERNAL FIXATION (Left) as a surgical intervention.  The patient's history has been reviewed, patient examined, no change in status, stable for surgery.  I have reviewed the patient's chart and labs.  Questions were answered to the patient's satisfaction.     My Rinke Artavis Cowie

## 2021-03-02 NOTE — Interval H&P Note (Signed)
History and Physical Interval Note:  03/02/2021 12:09 PM  David Fitzgerald  has presented today for surgery, with the diagnosis of Left elbow joint instability.  The various methods of treatment have been discussed with the patient and family. After consideration of risks, benefits and other options for treatment, the patient has consented to  Procedure(s): LEFT ELBOW LATERAL ULNAR COLLATERAL LIGAMENT REPAIR/ POSSIBLE MEDIAL LIGAMENT REPAIR/ POSSIBLE EXTERNAL FIXATION (Left) as a surgical intervention.  The patient's history has been reviewed, patient examined, no change in status, stable for surgery.  I have reviewed the patient's chart and labs.  Questions were answered to the patient's satisfaction.     David Fitzgerald

## 2021-03-02 NOTE — Op Note (Signed)
Date of Surgery: 03/02/2021  INDICATIONS: [name] is a 61 y.o.-year-old male who dislocated his elbow on 01/30/21.  It was closed reduced successfully.  Unfortunately, he's developed recurrent instability with subluxation on attempted ROM.  CT was obtained which showed no associated fracture.  Risks, benefits, and alternatives to surgery were again discussed with the patient wishing to proceed with surgery.  Informed consent was signed after our discussion.   PREOPERATIVE DIAGNOSIS: 1. Lateral elbow instability  POSTOPERATIVE DIAGNOSIS: Same.  PROCEDURE: 1. Left LUCL repair   SURGEON: Audria Nine, M.D.  ASSIST: Vanetta Mulders, M.D.  ANESTHESIA:  general, regional  IV FLUIDS AND URINE: See anesthesia.  ESTIMATED BLOOD LOSS: 100 mL.  IMPLANTS:  Implant Name Type Inv. Item Serial No. Manufacturer Lot No. LRB No. Used Action  MEINISCAL ROOT REPAIR KIT PEEK - [id number] Anchor MEINISCAL ROOT REPAIR KIT PEEK  [employer] [id number] Left 1 Wasted     DRAINS: None  COMPLICATIONS: See description of procedure.  DESCRIPTION OF PROCEDURE: The patient was met in the preoperative holding area where the surgical site was marked and the consent form was verified.  The patient was then taken to the operating room and transferred to the operating table.  All bony prominences were well padded.  General endotracheal intubation was successfully performed.  The operative extremity was prepped and draped in the usual and sterile fashion.  A formal time-out was performed to confirm that this was the correct patient, surgery, side, and site.   Following timeout, a lateral approach was made to the elbow.  The skin and subcutaneous tissue was divided.  Crossing vessels were coagulated with the bovie.  The common extensor tendon was split centered over the radial head.  The LUCL origin on the epicondyle seemed to be intact, however, the LUCL tissue was of very poor quality and quite lax.  The elbow was  very unstable laterally.  The LUCL origin was divided on the epicondyle to allow for placement of a suture anchor. The coronoid was identified.  The anterior capsule was still attached and found to be taught. The elbow was easily reducible. Two #2 broad band tape sutures were placed in the anterior and posterior limbs of the LUCL.  The limbs were placed in the eyelet of a Zimmer Quatro anchor.  A guide pin was placed in the isometric point of the medial epicondyle.  A hole for the anchor was drilled.  The elbow was held in valgus and the anchor inserted.  The sutures were then tensioned.  The anchor was impacted into place.  The remaining limbs of the ligament were then reapproximated and imbricated using a #1 vicryl and additional #2 broad band sutures.  AP and lateral fluoroscopic views showed a concentrically reduced elbow joint that was stable with range of motion. There was no valgus laxity to suggest medial sided ligament insufficiency.  The wound was then thoroughly irrigated. One gram of vancomycin powder was placed into the wound.  The wound was then closed in a layered fashion with 2-0 vicryl suture followed by 3-0 nylon sutures in a running horizontal mattress fashion.  The wound was dressed with xeroform, folded kerlix gauze, and sterile cast padding.  A dorsal long arm splint was applied with the elbow in pronation.  A supplemental anterior plaster slab was used to further stabilize the elbow.  The patient was then reversed from sedation and extubated uneventfully.  He was transferred to the postoperative bed in stable condition. All counts were  correct at the end of the procedure.  He was then taken to the PACU.   Dr. Vanetta Mulders was necessary for completion of the case given this patients significant body habitus and complex post traumatic elbow instability.    POSTOPERATIVE PLAN: He will be discharged to home with appropriate pain medication and discharge instructions.  I'll see him back  in two weeks for his first postop visit.   Audria Nine, MD 3:37 PM

## 2021-03-02 NOTE — Anesthesia Procedure Notes (Addendum)
°  Anesthesia Regional Block: Supraclavicular block   Pre-Anesthetic Checklist: , timeout performed,  Correct Patient, Correct Site, Correct Laterality,  Correct Procedure, Correct Position, site marked,  Risks and benefits discussed,  Surgical consent,  Pre-op evaluation,  At surgeon's request and post-op pain management  Laterality: Left  Prep: chloraprep       Needles:  Injection technique: Single-shot  Needle Type: Echogenic Stimulator Needle     Needle Length: 5cm  Needle Gauge: 22     Additional Needles:   Procedures:, nerve stimulator,,, ultrasound used (permanent image in chart),,     Nerve Stimulator or Paresthesia:  Response: hand, 0.45 mA  Additional Responses:   Narrative:  Start time: 03/02/2021 11:25 AM End time: 03/02/2021 11:30 AM Injection made incrementally with aspirations every 5 mL.  Performed by: Personally  Anesthesiologist: Janeece Riggers, MD  Additional Notes: Functioning IV was confirmed and monitors were applied.  A 59mm 22ga Arrow echogenic stimulator needle was used. Sterile prep and drape,hand hygiene and sterile gloves were used. Ultrasound guidance: relevant anatomy identified, needle position confirmed, local anesthetic spread visualized around nerve(s)., vascular puncture avoided.  Image printed for medical record. Negative aspiration and negative test dose prior to incremental administration of local anesthetic. The patient tolerated the procedure well.

## 2021-03-02 NOTE — Transfer of Care (Signed)
Immediate Anesthesia Transfer of Care Note  Patient: David Fitzgerald  Procedure(s) Performed: LEFT ELBOW LATERAL ULNAR COLLATERAL LIGAMENT REPAIR/ POSSIBLE MEDIAL LIGAMENT REPAIR (Left: Elbow)  Patient Location: PACU  Anesthesia Type:General and Regional  Level of Consciousness: awake, oriented and patient cooperative  Airway & Oxygen Therapy: Patient Spontanous Breathing and Patient connected to face mask oxygen  Post-op Assessment: Report given to RN and Post -op Vital signs reviewed and stable  Post vital signs: Reviewed  Last Vitals:  Vitals Value Taken Time  BP 129/82 03/02/21 1529  Temp    Pulse 152 03/02/21 1532  Resp 20 03/02/21 1533  SpO2 96 % 03/02/21 1532  Vitals shown include unvalidated device data.  Last Pain:  Vitals:   03/02/21 1031  TempSrc:   PainSc: 8       Patients Stated Pain Goal: 4 (23/34/35 6861)  Complications: No notable events documented.

## 2021-03-02 NOTE — Discharge Instructions (Signed)
° ° °  Audria Nine, M.D. Hand Surgery  POST-OPERATIVE DISCHARGE INSTRUCTIONS   PRESCRIPTIONS: You have been given a prescription to be taken as directed for post-operative pain control.  You may also take over the counter ibuprofen/aleve and tylenol for pain. Take this as directed on the packaging. Do not exceed 3000 mg tylenol/acetaminophen in 24 hours.  Ibuprofen 600-800 mg (3-4) tablets by mouth every 6 hours as needed for pain.  OR Aleve 2 tablets by mouth every 12 hours (twice daily) as needed for pain.  AND/OR Tylenol 1000 mg (2 tablets) every 8 hours as needed for pain.  Please use your pain medication carefully, as refills are limited and you may not be provided with one.  As stated above, please use over the counter pain medicine - it will also be helpful with decreasing your swelling.    ANESTHESIA: After your surgery, post-surgical discomfort or pain is likely. This discomfort can last several days to a few weeks. At certain times of the day your discomfort may be more intense.   Did you receive a nerve block?  A nerve block can provide pain relief for one hour to two days after your surgery. As long as the nerve block is working, you will experience little or no sensation in the area the surgeon operated on.  As the nerve block wears off, you will begin to experience pain or discomfort. It is very important that you begin taking your prescribed pain medication before the nerve block fully wears off. Treating your pain at the first sign of the block wearing off will ensure your pain is better controlled and more tolerable when full-sensation returns. Do not wait until the pain is intolerable, as the medicine will be less effective. It is better to treat pain in advance than to try and catch up.   General Anesthesia:  If you did not receive a nerve block during your surgery, you will need to start taking your pain medication shortly after your surgery and should continue to do  so as prescribed by your surgeon.     SURGICAL BANDAGES:  Keep your dressing and/or splint clean and dry at all times.  Do not remove until you are seen again in the office.  If careful, you may place a plastic bag over your bandage and tape the end to shower, but be careful, do not get your bandages wet.     HAND THERAPY:  You may not need any. If you do, we will begin this at your follow up visit in the clinic.    ACTIVITY AND WORK: You are encouraged to move any fingers which are not in the bandage.  Light use of the fingers is allowed to assist the other hand with daily hygiene and eating, but strong gripping or lifting is often uncomfortable and should be avoided.  You might miss a variable period of time from work and hopefully this issue has been discussed prior to surgery. You may not do any heavy work with your affected hand for about 2 weeks.    [facility] [address] [place],  [place]  [address] [phone]

## 2021-03-04 NOTE — Anesthesia Postprocedure Evaluation (Signed)
Anesthesia Post Note  Patient: David Fitzgerald  Procedure(s) Performed: LEFT ELBOW LATERAL ULNAR COLLATERAL LIGAMENT REPAIR/ POSSIBLE MEDIAL LIGAMENT REPAIR (Left: Elbow)     Patient location during evaluation: PACU Anesthesia Type: Regional Level of consciousness: awake and alert Pain management: pain level controlled Vital Signs Assessment: post-procedure vital signs reviewed and stable Respiratory status: spontaneous breathing, nonlabored ventilation, respiratory function stable and patient connected to nasal cannula oxygen Cardiovascular status: blood pressure returned to baseline and stable Postop Assessment: no apparent nausea or vomiting Anesthetic complications: no   No notable events documented.  Last Vitals:  Vitals:   03/02/21 1659 03/02/21 1700  BP:  114/80  Pulse: (!) 101 (!) 40  Resp: 16 18  Temp:  (!) 36.1 C  SpO2: 94% 96%    Last Pain:  Vitals:   03/02/21 1700  TempSrc:   PainSc: 0-No pain                 Bracha Frankowski

## 2021-03-05 ENCOUNTER — Encounter (HOSPITAL_COMMUNITY): Payer: Self-pay | Admitting: Orthopedic Surgery

## 2021-03-06 ENCOUNTER — Encounter: Payer: Medicare HMO | Admitting: Physical Therapy

## 2021-03-06 DIAGNOSIS — Z6841 Body Mass Index (BMI) 40.0 and over, adult: Secondary | ICD-10-CM | POA: Diagnosis not present

## 2021-03-06 DIAGNOSIS — M1990 Unspecified osteoarthritis, unspecified site: Secondary | ICD-10-CM | POA: Diagnosis not present

## 2021-03-06 DIAGNOSIS — I10 Essential (primary) hypertension: Secondary | ICD-10-CM | POA: Diagnosis not present

## 2021-03-08 ENCOUNTER — Encounter: Payer: Medicare HMO | Admitting: Physical Therapy

## 2021-03-12 ENCOUNTER — Ambulatory Visit: Payer: Self-pay

## 2021-03-12 ENCOUNTER — Other Ambulatory Visit: Payer: Self-pay

## 2021-03-12 ENCOUNTER — Ambulatory Visit (INDEPENDENT_AMBULATORY_CARE_PROVIDER_SITE_OTHER): Payer: Medicare HMO | Admitting: Orthopedic Surgery

## 2021-03-12 ENCOUNTER — Encounter: Payer: Self-pay | Admitting: Orthopedic Surgery

## 2021-03-12 DIAGNOSIS — M25322 Other instability, left elbow: Secondary | ICD-10-CM

## 2021-03-13 ENCOUNTER — Encounter: Payer: Medicare HMO | Admitting: Physical Therapy

## 2021-03-13 ENCOUNTER — Encounter: Payer: Self-pay | Admitting: Physical Therapy

## 2021-03-13 NOTE — Therapy (Signed)
[facility] [address] [place], [place], [address] Phone: [phone]   Fax:  [phone]  Patient Details  Name: David Fitzgerald MRN: [id number] Date of Birth: 1960-04-09 Referring Provider:  Meridee Score, MD  Encounter Date: 03/13/2021  PHYSICAL THERAPY DISCHARGE SUMMARY  Visits from Start of Care: 4  Current functional level related to goals / functional outcomes: Not able to retest as discharged due to medical change.   Remaining deficits: Patient was progressing with prosthesis use & function. He fell and dislocated his elbow requiring surgery.  He has not been able to return since his fall. For details of functional level, see last PT visit on 01/23/2021.     Education / Equipment: Prosthetic care & initial HEP.    Patient agrees to discharge. Patient goals were not met. Patient is being discharged due to a change in medical status.  Please refer back to PT once he is able to bear weight on his arm.    Jamey Reas, PT, DPT 03/13/2021, 1:12 PM  [facility] [address] [place], [place], [address] Phone: [phone]   Fax:  [phone]

## 2021-03-14 DIAGNOSIS — M47816 Spondylosis without myelopathy or radiculopathy, lumbar region: Secondary | ICD-10-CM | POA: Diagnosis not present

## 2021-03-14 DIAGNOSIS — Z79891 Long term (current) use of opiate analgesic: Secondary | ICD-10-CM | POA: Diagnosis not present

## 2021-03-14 DIAGNOSIS — M1612 Unilateral primary osteoarthritis, left hip: Secondary | ICD-10-CM | POA: Diagnosis not present

## 2021-03-14 DIAGNOSIS — G894 Chronic pain syndrome: Secondary | ICD-10-CM | POA: Diagnosis not present

## 2021-03-14 DIAGNOSIS — M25552 Pain in left hip: Secondary | ICD-10-CM | POA: Diagnosis not present

## 2021-03-14 DIAGNOSIS — M5136 Other intervertebral disc degeneration, lumbar region: Secondary | ICD-10-CM | POA: Diagnosis not present

## 2021-03-14 DIAGNOSIS — Z1389 Encounter for screening for other disorder: Secondary | ICD-10-CM | POA: Diagnosis not present

## 2021-03-14 DIAGNOSIS — Z79899 Other long term (current) drug therapy: Secondary | ICD-10-CM | POA: Diagnosis not present

## 2021-03-15 ENCOUNTER — Encounter: Payer: Medicare HMO | Admitting: Physical Therapy

## 2021-03-16 ENCOUNTER — Telehealth: Payer: Self-pay | Admitting: Orthopedic Surgery

## 2021-03-16 NOTE — Telephone Encounter (Signed)
Pt called. States that he was suppose to be getting a referral to another surgeon for his elbow. Please call him and let him know when and where referral was sent.   CB [phone]

## 2021-03-19 DIAGNOSIS — M25322 Other instability, left elbow: Secondary | ICD-10-CM | POA: Diagnosis not present

## 2021-03-19 DIAGNOSIS — S53125A Posterior dislocation of left ulnohumeral joint, initial encounter: Secondary | ICD-10-CM | POA: Diagnosis not present

## 2021-03-20 ENCOUNTER — Encounter: Payer: Medicare HMO | Admitting: Physical Therapy

## 2021-03-22 ENCOUNTER — Encounter: Payer: Medicare HMO | Admitting: Physical Therapy

## 2021-03-22 ENCOUNTER — Encounter: Payer: Self-pay | Admitting: Orthopedic Surgery

## 2021-03-22 NOTE — Progress Notes (Signed)
Post-Op Visit Note   Patient: David Fitzgerald           Date of Birth: 04-29-1960           MRN: [id number] Visit Date: 03/12/2021 PCP: Angelina Sheriff, MD   Assessment & Plan:  Chief Complaint:  Chief Complaint  Patient presents with   Left Elbow - Routine Post Op    Lateral ulnar collateral ligament repair 03/02/2021   Visit Diagnoses:  1. Instability of left elbow joint     Plan: Patient is 10 days out from left LUCL repair for elbow instability.  Imaging today shows that the elbow has redislocated.  I am able to feel the elbow reduce but am unable to hold it reduced in a splint given his body habitus.  Discussed with patient that I would like to refer him to [facility] to see someone with more elbow expertise given his very complex problem.  We discussed that this may require ligament reconstruction given his poor tissue quality and will likely need an external fixator.  He is in agreement with this plan.    Follow-Up Instructions: No follow-ups on file.   Orders:  Orders Placed This Encounter  Procedures   XR Elbow 2 Views Left   No orders of the defined types were placed in this encounter.   Imaging: No results found.  PMFS History: Patient Active Problem List   Diagnosis Date Noted   Dislocation of left elbow    Instability of left elbow joint 02/22/2021   IBS (irritable bowel syndrome) 12/08/2020   Dyspnea on minimal exertion 12/08/2020   Epigastric pain 12/08/2020   Hematuria 12/08/2020   Nausea & vomiting 12/08/2020   Type II diabetes mellitus, uncontrolled 12/08/2020   Atrial fibrillation with rapid ventricular response ([place]) 12/08/2020   Abscess of bursa of left ankle 10/04/2020   Subacute osteomyelitis, left ankle and foot ([place])    Abscess of ankle    Asthma 07/31/2020   Depression 07/31/2020   Dysrhythmia 07/31/2020   GERD (gastroesophageal reflux disease) 07/31/2020   History of kidney stones 07/31/2020   Pneumonia 07/31/2020   Left ankle  pain 04/05/2020   Open dislocation of left ankle    A-fib (HCC)    OSA (obstructive sleep apnea) 03/09/2020   Wound dehiscence, traumatic injury repair, left medial ankle  03/08/2020   Vitamin D deficiency 02/16/2020   Chronic, continuous use of opioids 02/15/2020   Open fracture dislocation of left ankle 01/31/2020   Ankle fracture 01/31/2020   Permanent atrial fibrillation ([place])    Hypertension    Diabetes ([place])    Bee sting allergy    Hypotension 12/25/2017   Chronic anticoagulation 03/07/2017   Coronary artery calcification seen on CT scan 02/07/2017   CAD in native artery 02/07/2017   Persistent atrial fibrillation ([place]) 02/06/2017   Anxiety 02/06/2017   Arthritis 02/06/2017   Pernicious anemia 02/06/2017   Allergy to pollen 04/06/2015   OSA treated with BiPAP 12/14/2014   Obesity hypoventilation syndrome ([place]) 12/14/2014   Morbid obesity with BMI of 40.0-44.9, adult ([place]) 12/14/2014   Hypertensive heart disease 12/14/2014   Hyperlipidemia, mixed 12/14/2014   Type 2 diabetes mellitus with diabetic neuropathy ([place]) 12/14/2014   Chronic venous insufficiency 12/14/2014   Edema 12/14/2014   Morbid obesity ([place]) 12/14/2014   Bell's palsy 02/24/2013   Polyneuropathy in diabetes ([place]) 02/24/2013   Syndrome affecting cervical region 02/24/2013   Trigeminal neuralgia 02/24/2013   Lumbar pain with radiation  down left leg 07/09/2012   BMI 45.0-49.9, adult ([place]) 07/09/2012   Chronic pain syndrome 07/09/2012   Morbid obesity with BMI of 45.0-49.9, adult ([place]) 07/09/2012   Pain syndrome, chronic 07/09/2012   Past Medical History:  Diagnosis Date   A-fib ([place])    Abscess of ankle    Abscess of bursa of left ankle 10/04/2020   Allergy to pollen 04/06/2015   Ankle fracture 01/31/2020   Anxiety 02/06/2017   Arthritis 02/06/2017   Asthma    as a child   Atrial fibrillation with rapid ventricular response ([place]) 12/08/2020   Bee sting allergy    wasp / mixed vespid   Bell's  palsy 02/24/2013   BMI 45.0-49.9, adult ([place]) 07/09/2012   CAD in native artery 02/07/2017   CHF (congestive heart failure) (HCC)    Chronic anticoagulation 03/07/2017   Chronic pain syndrome 07/09/2012   Chronic venous insufficiency 12/14/2014   Chronic, continuous use of opioids 02/15/2020   Coronary artery calcification seen on CT scan 02/07/2017   Depression    Diabetes ([place])    Dyspnea on minimal exertion 12/08/2020   Dysrhythmia    afib   Edema 12/14/2014   Epigastric pain 12/08/2020   GERD (gastroesophageal reflux disease)    Hematuria 12/08/2020   History of kidney stones    Hyperlipidemia, mixed 12/14/2014   Hypertension    Hypertensive heart disease 12/14/2014   Hypotension 12/25/2017   IBS (irritable bowel syndrome)    Instability of left elbow joint 02/22/2021   Left ankle pain 04/05/2020   Lumbar pain with radiation down left leg 07/09/2012   Morbid obesity ([place]) 12/14/2014   Morbid obesity with BMI of 40.0-44.9, adult ([place]) 12/14/2014   Morbid obesity with BMI of 45.0-49.9, adult ([place]) 07/09/2012   Nausea & vomiting 12/08/2020   Obesity hypoventilation syndrome ([place]) 12/14/2014   Open dislocation of left ankle    Open fracture dislocation of left ankle 01/31/2020   OSA (obstructive sleep apnea) 03/09/2020   doesn't use a Cpap   OSA treated with BiPAP 12/14/2014   Pain syndrome, chronic 07/09/2012   Permanent atrial fibrillation (HCC)    Pernicious anemia 02/06/2017   Persistent atrial fibrillation ([place]) 02/06/2017   CHADS2vasc of 2   Pneumonia    Polyneuropathy in diabetes ([place]) 02/24/2013   Subacute osteomyelitis, left ankle and foot ([place])    Syndrome affecting cervical region 02/24/2013   Trigeminal neuralgia 02/24/2013   Type 2 diabetes mellitus with diabetic neuropathy ([place]) 12/14/2014   Type II diabetes mellitus, uncontrolled 12/08/2020   Vitamin D deficiency 02/16/2020   Wound dehiscence, traumatic injury repair, left medial ankle  03/08/2020     Family History  Problem Relation Age of Onset   Breast cancer Mother    Heart disease Father    CAD Father    Atrial fibrillation Father    Stomach cancer Paternal Aunt    Heart disease Paternal Uncle    Breast cancer Maternal Grandmother     Past Surgical History:  Procedure Laterality Date    INTERNAL FIXATION LEFT TIBIA TO CALCANEUS AND APPLY SKIN GRAFT (Left Ankle)  04/05/2020   ADENOIDECTOMY     AMPUTATION Left 10/04/2020   Procedure: LEFT BELOW KNEE AMPUTATION;  Surgeon: [name], MD;  Location: [place];  Service: Orthopedics;  Laterality: Left;   BACK SURGERY  2002 /2003   CARPAL TUNNEL RELEASE Bilateral    ELBOW LIGAMENT RECONSTRUCTION Left 03/02/2021   Procedure: LEFT ELBOW LATERAL ULNAR COLLATERAL LIGAMENT REPAIR/  POSSIBLE MEDIAL LIGAMENT REPAIR;  Surgeon: Sherilyn Cooter, MD;  Location: [place];  Service: Orthopedics;  Laterality: Left;   I & D EXTREMITY Left 01/31/2020   Procedure: IRRIGATION AND DEBRIDEMENT ANKLE;  Surgeon: Altamese [place], MD;  Location: [place];  Service: Orthopedics;  Laterality: Left;   I & D EXTREMITY Left 03/10/2020   Procedure: DEBRIDEMENT LEFT ANKLE, APPLY SKIN GRAFT;  Surgeon: [name], MD;  Location: [place];  Service: Orthopedics;  Laterality: Left;   KNEE SURGERY Left 2005   ORIF ANKLE FRACTURE Left 01/31/2020   ORIF ANKLE FRACTURE Left 01/31/2020   Procedure: OPEN REDUCTION INTERNAL FIXATION (ORIF) ANKLE FRACTURE;  Surgeon: Altamese Engelhard, MD;  Location: [place];  Service: Orthopedics;  Laterality: Left;   ORIF ANKLE FRACTURE Left 04/05/2020   Procedure: INTERNAL FIXATION LEFT TIBIA TO CALCANEUS AND APPLY SKIN GRAFT;  Surgeon: [name], MD;  Location: [place];  Service: Orthopedics;  Laterality: Left;   SHOULDER SURGERY Left 1998   TONSILLECTOMY     Social History   Occupational History   Not on file  Tobacco Use   Smoking status: Former    Packs/day: 3.00    Years: 35.00    Pack years: 105.00    Types: Cigarettes    Quit date:  04/13/2006    Years since quitting: 14.9   Smokeless tobacco: Never  Vaping Use   Vaping Use: Never used  Substance and Sexual Activity   Alcohol use: No    Alcohol/week: 0.0 standard drinks   Drug use: No   Sexual activity: Not on file

## 2021-03-23 DIAGNOSIS — M25322 Other instability, left elbow: Secondary | ICD-10-CM | POA: Diagnosis not present

## 2021-03-23 DIAGNOSIS — I4519 Other right bundle-branch block: Secondary | ICD-10-CM | POA: Diagnosis not present

## 2021-03-23 DIAGNOSIS — I4510 Unspecified right bundle-branch block: Secondary | ICD-10-CM | POA: Diagnosis not present

## 2021-03-23 DIAGNOSIS — I4891 Unspecified atrial fibrillation: Secondary | ICD-10-CM | POA: Diagnosis not present

## 2021-03-23 DIAGNOSIS — Z01810 Encounter for preprocedural cardiovascular examination: Secondary | ICD-10-CM | POA: Diagnosis not present

## 2021-03-26 ENCOUNTER — Other Ambulatory Visit: Payer: Self-pay | Admitting: Cardiology

## 2021-03-27 ENCOUNTER — Encounter: Payer: Medicare HMO | Admitting: Physical Therapy

## 2021-03-27 DIAGNOSIS — I110 Hypertensive heart disease with heart failure: Secondary | ICD-10-CM | POA: Diagnosis not present

## 2021-03-27 DIAGNOSIS — Z9889 Other specified postprocedural states: Secondary | ICD-10-CM | POA: Diagnosis not present

## 2021-03-27 DIAGNOSIS — Z7984 Long term (current) use of oral hypoglycemic drugs: Secondary | ICD-10-CM | POA: Diagnosis not present

## 2021-03-27 DIAGNOSIS — I482 Chronic atrial fibrillation, unspecified: Secondary | ICD-10-CM | POA: Diagnosis not present

## 2021-03-27 DIAGNOSIS — Z87891 Personal history of nicotine dependence: Secondary | ICD-10-CM | POA: Diagnosis not present

## 2021-03-27 DIAGNOSIS — M25322 Other instability, left elbow: Secondary | ICD-10-CM | POA: Diagnosis not present

## 2021-03-27 DIAGNOSIS — J45909 Unspecified asthma, uncomplicated: Secondary | ICD-10-CM | POA: Diagnosis not present

## 2021-03-27 DIAGNOSIS — Z7901 Long term (current) use of anticoagulants: Secondary | ICD-10-CM | POA: Diagnosis not present

## 2021-03-27 DIAGNOSIS — G8929 Other chronic pain: Secondary | ICD-10-CM | POA: Diagnosis not present

## 2021-03-27 DIAGNOSIS — I97191 Other postprocedural cardiac functional disturbances following other surgery: Secondary | ICD-10-CM | POA: Diagnosis not present

## 2021-03-27 DIAGNOSIS — E669 Obesity, unspecified: Secondary | ICD-10-CM | POA: Diagnosis not present

## 2021-03-27 DIAGNOSIS — I4821 Permanent atrial fibrillation: Secondary | ICD-10-CM | POA: Diagnosis not present

## 2021-03-27 DIAGNOSIS — G4733 Obstructive sleep apnea (adult) (pediatric): Secondary | ICD-10-CM | POA: Diagnosis not present

## 2021-03-27 DIAGNOSIS — S63592A Other specified sprain of left wrist, initial encounter: Secondary | ICD-10-CM | POA: Diagnosis not present

## 2021-03-27 DIAGNOSIS — E119 Type 2 diabetes mellitus without complications: Secondary | ICD-10-CM | POA: Diagnosis not present

## 2021-03-27 DIAGNOSIS — I252 Old myocardial infarction: Secondary | ICD-10-CM | POA: Diagnosis not present

## 2021-03-27 DIAGNOSIS — I5020 Unspecified systolic (congestive) heart failure: Secondary | ICD-10-CM | POA: Diagnosis not present

## 2021-03-27 DIAGNOSIS — S53125A Posterior dislocation of left ulnohumeral joint, initial encounter: Secondary | ICD-10-CM | POA: Diagnosis not present

## 2021-03-27 DIAGNOSIS — I4519 Other right bundle-branch block: Secondary | ICD-10-CM | POA: Diagnosis not present

## 2021-03-28 DIAGNOSIS — I4519 Other right bundle-branch block: Secondary | ICD-10-CM | POA: Diagnosis not present

## 2021-03-28 DIAGNOSIS — I110 Hypertensive heart disease with heart failure: Secondary | ICD-10-CM | POA: Diagnosis not present

## 2021-03-28 DIAGNOSIS — M25322 Other instability, left elbow: Secondary | ICD-10-CM | POA: Diagnosis not present

## 2021-03-28 DIAGNOSIS — G8929 Other chronic pain: Secondary | ICD-10-CM | POA: Diagnosis not present

## 2021-03-28 DIAGNOSIS — I4821 Permanent atrial fibrillation: Secondary | ICD-10-CM | POA: Diagnosis not present

## 2021-03-28 DIAGNOSIS — S53125A Posterior dislocation of left ulnohumeral joint, initial encounter: Secondary | ICD-10-CM | POA: Diagnosis not present

## 2021-03-28 DIAGNOSIS — E119 Type 2 diabetes mellitus without complications: Secondary | ICD-10-CM | POA: Diagnosis not present

## 2021-03-28 DIAGNOSIS — I4891 Unspecified atrial fibrillation: Secondary | ICD-10-CM | POA: Diagnosis not present

## 2021-03-28 DIAGNOSIS — I97191 Other postprocedural cardiac functional disturbances following other surgery: Secondary | ICD-10-CM | POA: Diagnosis not present

## 2021-03-28 DIAGNOSIS — I252 Old myocardial infarction: Secondary | ICD-10-CM | POA: Diagnosis not present

## 2021-03-28 DIAGNOSIS — S53105A Unspecified dislocation of left ulnohumeral joint, initial encounter: Secondary | ICD-10-CM | POA: Diagnosis not present

## 2021-03-28 DIAGNOSIS — J45909 Unspecified asthma, uncomplicated: Secondary | ICD-10-CM | POA: Diagnosis not present

## 2021-03-28 DIAGNOSIS — R0989 Other specified symptoms and signs involving the circulatory and respiratory systems: Secondary | ICD-10-CM | POA: Diagnosis not present

## 2021-03-28 DIAGNOSIS — I4892 Unspecified atrial flutter: Secondary | ICD-10-CM | POA: Diagnosis not present

## 2021-03-28 DIAGNOSIS — G4733 Obstructive sleep apnea (adult) (pediatric): Secondary | ICD-10-CM | POA: Diagnosis not present

## 2021-03-29 ENCOUNTER — Encounter: Payer: Medicare HMO | Admitting: Physical Therapy

## 2021-03-29 DIAGNOSIS — S53125A Posterior dislocation of left ulnohumeral joint, initial encounter: Secondary | ICD-10-CM | POA: Diagnosis not present

## 2021-03-29 DIAGNOSIS — I110 Hypertensive heart disease with heart failure: Secondary | ICD-10-CM | POA: Diagnosis not present

## 2021-03-29 DIAGNOSIS — I252 Old myocardial infarction: Secondary | ICD-10-CM | POA: Diagnosis not present

## 2021-03-29 DIAGNOSIS — G4733 Obstructive sleep apnea (adult) (pediatric): Secondary | ICD-10-CM | POA: Diagnosis not present

## 2021-03-29 DIAGNOSIS — J45909 Unspecified asthma, uncomplicated: Secondary | ICD-10-CM | POA: Diagnosis not present

## 2021-03-29 DIAGNOSIS — M25322 Other instability, left elbow: Secondary | ICD-10-CM | POA: Diagnosis not present

## 2021-03-29 DIAGNOSIS — S53105A Unspecified dislocation of left ulnohumeral joint, initial encounter: Secondary | ICD-10-CM | POA: Diagnosis not present

## 2021-03-29 DIAGNOSIS — I4821 Permanent atrial fibrillation: Secondary | ICD-10-CM | POA: Diagnosis not present

## 2021-03-29 DIAGNOSIS — Z7901 Long term (current) use of anticoagulants: Secondary | ICD-10-CM | POA: Diagnosis not present

## 2021-03-29 DIAGNOSIS — E119 Type 2 diabetes mellitus without complications: Secondary | ICD-10-CM | POA: Diagnosis not present

## 2021-03-29 DIAGNOSIS — I97191 Other postprocedural cardiac functional disturbances following other surgery: Secondary | ICD-10-CM | POA: Diagnosis not present

## 2021-03-29 DIAGNOSIS — I4519 Other right bundle-branch block: Secondary | ICD-10-CM | POA: Diagnosis not present

## 2021-03-29 DIAGNOSIS — I4891 Unspecified atrial fibrillation: Secondary | ICD-10-CM | POA: Diagnosis not present

## 2021-03-29 DIAGNOSIS — Z8679 Personal history of other diseases of the circulatory system: Secondary | ICD-10-CM | POA: Diagnosis not present

## 2021-03-30 ENCOUNTER — Telehealth: Payer: Self-pay | Admitting: Cardiology

## 2021-03-30 NOTE — Telephone Encounter (Signed)
Pt c/o medication issue:  1. Name of Medication: metoprolol tartrate (LOPRESSOR) 50 MG tablet (PREVIOUS)  Metoprolol succinate XL 50 MG (CURRENT)  2. How are you currently taking this medication (dosage and times per day)? Take five tablets once daily   3. Are you having a reaction (difficulty breathing--STAT)? no  4. What is your medication issue? Pts wife is concerned about how her husband is taking this medication... please advise

## 2021-03-30 NOTE — Telephone Encounter (Signed)
Pt advised to decrease his Toprol XL to 150 mg daily. Pt advised to keep a record of his BP/HR 1-2 hours after your medication. Pt advised to go to the ED if he begins to feel bad, have low Bp or heart rate. Pt verbalized understanding and will notify us on Monday of how he is doing.

## 2021-04-03 ENCOUNTER — Encounter: Payer: Medicare HMO | Admitting: Physical Therapy

## 2021-04-05 ENCOUNTER — Encounter: Payer: Medicare HMO | Admitting: Physical Therapy

## 2021-04-11 DIAGNOSIS — Z9889 Other specified postprocedural states: Secondary | ICD-10-CM | POA: Diagnosis not present

## 2021-04-11 DIAGNOSIS — M25322 Other instability, left elbow: Secondary | ICD-10-CM | POA: Diagnosis not present

## 2021-04-11 DIAGNOSIS — S53125A Posterior dislocation of left ulnohumeral joint, initial encounter: Secondary | ICD-10-CM | POA: Diagnosis not present

## 2021-04-19 ENCOUNTER — Ambulatory Visit (INDEPENDENT_AMBULATORY_CARE_PROVIDER_SITE_OTHER): Payer: Medicare HMO | Admitting: *Deleted

## 2021-04-19 ENCOUNTER — Other Ambulatory Visit: Payer: Self-pay

## 2021-04-19 DIAGNOSIS — T63441D Toxic effect of venom of bees, accidental (unintentional), subsequent encounter: Secondary | ICD-10-CM

## 2021-04-25 DIAGNOSIS — M25322 Other instability, left elbow: Secondary | ICD-10-CM | POA: Diagnosis not present

## 2021-04-26 DIAGNOSIS — T8140XA Infection following a procedure, unspecified, initial encounter: Secondary | ICD-10-CM | POA: Insufficient documentation

## 2021-04-26 HISTORY — DX: Infection following a procedure, unspecified, initial encounter: T81.40XA

## 2021-05-01 DIAGNOSIS — Z79891 Long term (current) use of opiate analgesic: Secondary | ICD-10-CM | POA: Diagnosis not present

## 2021-05-01 DIAGNOSIS — M25552 Pain in left hip: Secondary | ICD-10-CM | POA: Diagnosis not present

## 2021-05-01 DIAGNOSIS — S53125D Posterior dislocation of left ulnohumeral joint, subsequent encounter: Secondary | ICD-10-CM | POA: Diagnosis not present

## 2021-05-01 DIAGNOSIS — Z79899 Other long term (current) drug therapy: Secondary | ICD-10-CM | POA: Diagnosis not present

## 2021-05-01 DIAGNOSIS — Z4789 Encounter for other orthopedic aftercare: Secondary | ICD-10-CM | POA: Diagnosis not present

## 2021-05-01 DIAGNOSIS — M1612 Unilateral primary osteoarthritis, left hip: Secondary | ICD-10-CM | POA: Diagnosis not present

## 2021-05-01 DIAGNOSIS — Z1389 Encounter for screening for other disorder: Secondary | ICD-10-CM | POA: Diagnosis not present

## 2021-05-01 DIAGNOSIS — M5136 Other intervertebral disc degeneration, lumbar region: Secondary | ICD-10-CM | POA: Diagnosis not present

## 2021-05-01 DIAGNOSIS — M47816 Spondylosis without myelopathy or radiculopathy, lumbar region: Secondary | ICD-10-CM | POA: Diagnosis not present

## 2021-05-01 DIAGNOSIS — G894 Chronic pain syndrome: Secondary | ICD-10-CM | POA: Diagnosis not present

## 2021-05-01 DIAGNOSIS — M25322 Other instability, left elbow: Secondary | ICD-10-CM | POA: Diagnosis not present

## 2021-05-02 DIAGNOSIS — E1142 Type 2 diabetes mellitus with diabetic polyneuropathy: Secondary | ICD-10-CM | POA: Diagnosis not present

## 2021-05-02 DIAGNOSIS — M25322 Other instability, left elbow: Secondary | ICD-10-CM | POA: Diagnosis not present

## 2021-05-02 DIAGNOSIS — I509 Heart failure, unspecified: Secondary | ICD-10-CM | POA: Diagnosis not present

## 2021-05-02 DIAGNOSIS — Z87891 Personal history of nicotine dependence: Secondary | ICD-10-CM | POA: Diagnosis not present

## 2021-05-02 DIAGNOSIS — Z993 Dependence on wheelchair: Secondary | ICD-10-CM | POA: Diagnosis not present

## 2021-05-02 DIAGNOSIS — I110 Hypertensive heart disease with heart failure: Secondary | ICD-10-CM | POA: Diagnosis not present

## 2021-05-02 DIAGNOSIS — T8140XA Infection following a procedure, unspecified, initial encounter: Secondary | ICD-10-CM | POA: Diagnosis not present

## 2021-05-02 DIAGNOSIS — T84611A Infection and inflammatory reaction due to internal fixation device of left humerus, initial encounter: Secondary | ICD-10-CM | POA: Diagnosis not present

## 2021-05-02 DIAGNOSIS — K219 Gastro-esophageal reflux disease without esophagitis: Secondary | ICD-10-CM | POA: Diagnosis not present

## 2021-05-16 DIAGNOSIS — S53125D Posterior dislocation of left ulnohumeral joint, subsequent encounter: Secondary | ICD-10-CM | POA: Diagnosis not present

## 2021-05-16 DIAGNOSIS — M25322 Other instability, left elbow: Secondary | ICD-10-CM | POA: Diagnosis not present

## 2021-05-29 DIAGNOSIS — M1612 Unilateral primary osteoarthritis, left hip: Secondary | ICD-10-CM | POA: Diagnosis not present

## 2021-05-29 DIAGNOSIS — M47816 Spondylosis without myelopathy or radiculopathy, lumbar region: Secondary | ICD-10-CM | POA: Diagnosis not present

## 2021-05-29 DIAGNOSIS — M5136 Other intervertebral disc degeneration, lumbar region: Secondary | ICD-10-CM | POA: Diagnosis not present

## 2021-05-29 DIAGNOSIS — Z79899 Other long term (current) drug therapy: Secondary | ICD-10-CM | POA: Diagnosis not present

## 2021-05-29 DIAGNOSIS — Z1389 Encounter for screening for other disorder: Secondary | ICD-10-CM | POA: Diagnosis not present

## 2021-05-29 DIAGNOSIS — M25552 Pain in left hip: Secondary | ICD-10-CM | POA: Diagnosis not present

## 2021-05-29 DIAGNOSIS — Z79891 Long term (current) use of opiate analgesic: Secondary | ICD-10-CM | POA: Diagnosis not present

## 2021-05-29 DIAGNOSIS — G894 Chronic pain syndrome: Secondary | ICD-10-CM | POA: Diagnosis not present

## 2021-05-30 DIAGNOSIS — M778 Other enthesopathies, not elsewhere classified: Secondary | ICD-10-CM | POA: Diagnosis not present

## 2021-05-30 DIAGNOSIS — M25322 Other instability, left elbow: Secondary | ICD-10-CM | POA: Diagnosis not present

## 2021-05-30 DIAGNOSIS — Z9889 Other specified postprocedural states: Secondary | ICD-10-CM | POA: Diagnosis not present

## 2021-05-30 DIAGNOSIS — S53125A Posterior dislocation of left ulnohumeral joint, initial encounter: Secondary | ICD-10-CM | POA: Diagnosis not present

## 2021-06-05 DIAGNOSIS — M25522 Pain in left elbow: Secondary | ICD-10-CM | POA: Diagnosis not present

## 2021-06-05 DIAGNOSIS — M25322 Other instability, left elbow: Secondary | ICD-10-CM | POA: Diagnosis not present

## 2021-06-05 DIAGNOSIS — Z7409 Other reduced mobility: Secondary | ICD-10-CM | POA: Diagnosis not present

## 2021-06-11 DIAGNOSIS — I509 Heart failure, unspecified: Secondary | ICD-10-CM | POA: Insufficient documentation

## 2021-06-14 ENCOUNTER — Ambulatory Visit: Payer: Medicare HMO

## 2021-06-16 NOTE — Progress Notes (Deleted)
?Cardiology Office Note:   ? ?Date:  06/16/2021  ? ?ID:  David Fitzgerald, DOB Jul 25, 1960, MRN [id number] ? ?PCP:  Angelina Sheriff, MD  ?Cardiologist:  Shirlee More, MD   ? ?Referring MD: Angelina Sheriff, MD  ? ? ?ASSESSMENT:   ? ?No diagnosis found. ?PLAN:   ? ?In order of problems listed above: ? ?*** ? ? ?Next appointment: *** ? ? ?Medication Adjustments/Labs and Tests Ordered: ?Current medicines are reviewed at length with the patient today.  Concerns regarding medicines are outlined above.  ?No orders of the defined types were placed in this encounter. ? ?No orders of the defined types were placed in this encounter. ? ? ?No chief complaint on file. ? ? ?History of Present Illness:   ? ?David Fitzgerald is a 61 y.o. male with a hx of permanent atrial fibrillation with chronic anticoagulation and hypertensive heart disease with heart failure last seen 09/11/2020.  His echocardiogram 08/18/2020 showed an ejection fraction 40 to 45% with moderate concentric LVH his proBNP level was elevated was placed on a loop diuretic and Entresto SGLT2 inhibitor last seen 12/12/2020. ?Compliance with diet, lifestyle and medications: *** ?Past Medical History:  ?Diagnosis Date  ? A-fib ([place])   ? Abscess of ankle   ? Abscess of bursa of left ankle 10/04/2020  ? Allergy to pollen 04/06/2015  ? Ankle fracture 01/31/2020  ? Anxiety 02/06/2017  ? Arthritis 02/06/2017  ? Asthma   ? as a child  ? Atrial fibrillation with rapid ventricular response ([place]) 12/08/2020  ? Bee sting allergy   ? wasp / mixed vespid  ? Bell's palsy 02/24/2013  ? BMI 45.0-49.9, adult ([place]) 07/09/2012  ? CAD in native artery 02/07/2017  ? CHF (congestive heart failure) ([place])   ? Chronic anticoagulation 03/07/2017  ? Chronic pain syndrome 07/09/2012  ? Chronic venous insufficiency 12/14/2014  ? Chronic, continuous use of opioids 02/15/2020  ? Coronary artery calcification seen on CT scan 02/07/2017  ? Depression   ? Diabetes ([place])   ? Dyspnea on minimal  exertion 12/08/2020  ? Dysrhythmia   ? afib  ? Edema 12/14/2014  ? Epigastric pain 12/08/2020  ? GERD (gastroesophageal reflux disease)   ? Hematuria 12/08/2020  ? History of kidney stones   ? Hyperlipidemia, mixed 12/14/2014  ? Hypertension   ? Hypertensive heart disease 12/14/2014  ? Hypotension 12/25/2017  ? IBS (irritable bowel syndrome)   ? Instability of left elbow joint 02/22/2021  ? Left ankle pain 04/05/2020  ? Lumbar pain with radiation down left leg 07/09/2012  ? Morbid obesity ([place]) 12/14/2014  ? Morbid obesity with BMI of 40.0-44.9, adult ([place]) 12/14/2014  ? Morbid obesity with BMI of 45.0-49.9, adult ([place]) 07/09/2012  ? Nausea & vomiting 12/08/2020  ? Obesity hypoventilation syndrome ([place]) 12/14/2014  ? Open dislocation of left ankle   ? Open fracture dislocation of left ankle 01/31/2020  ? OSA (obstructive sleep apnea) 03/09/2020  ? doesn't use a Cpap  ? OSA treated with BiPAP 12/14/2014  ? Pain syndrome, chronic 07/09/2012  ? Permanent atrial fibrillation ([place])   ? Pernicious anemia 02/06/2017  ? Persistent atrial fibrillation ([place]) 02/06/2017  ? CHADS2vasc of 2  ? Pneumonia   ? Polyneuropathy in diabetes ([place]) 02/24/2013  ? Subacute osteomyelitis, left ankle and foot ([place])   ? Syndrome affecting cervical region 02/24/2013  ? Trigeminal neuralgia 02/24/2013  ? Type 2 diabetes mellitus with diabetic neuropathy ([place]) 12/14/2014  ? Type II diabetes mellitus,  uncontrolled 12/08/2020  ? Vitamin D deficiency 02/16/2020  ? Wound dehiscence, traumatic injury repair, left medial ankle  03/08/2020  ? ? ?Past Surgical History:  ?Procedure Laterality Date  ?  INTERNAL FIXATION LEFT TIBIA TO CALCANEUS AND APPLY SKIN GRAFT (Left Ankle)  04/05/2020  ? ADENOIDECTOMY    ? AMPUTATION Left 10/04/2020  ? Procedure: LEFT BELOW KNEE AMPUTATION;  Surgeon: [name], MD;  Location: [place] [facility];  Service: Orthopedics;  Laterality: Left;  ? BACK SURGERY  2002 /2003  ? CARPAL TUNNEL RELEASE Bilateral   ? ELBOW LIGAMENT  RECONSTRUCTION Left 03/02/2021  ? Procedure: LEFT ELBOW LATERAL ULNAR COLLATERAL LIGAMENT REPAIR/ POSSIBLE MEDIAL LIGAMENT REPAIR;  Surgeon: Sherilyn Cooter, MD;  Location: [place];  Service: Orthopedics;  Laterality: Left;  ? I & D EXTREMITY Left 01/31/2020  ? Procedure: IRRIGATION AND DEBRIDEMENT ANKLE;  Surgeon: Altamese [place], MD;  Location: [place];  Service: Orthopedics;  Laterality: Left;  ? I & D EXTREMITY Left 03/10/2020  ? Procedure: DEBRIDEMENT LEFT ANKLE, APPLY SKIN GRAFT;  Surgeon: [name], MD;  Location: [facility];  Service: Orthopedics;  Laterality: Left;  ? KNEE SURGERY Left 2005  ? ORIF ANKLE FRACTURE Left 01/31/2020  ? ORIF ANKLE FRACTURE Left 01/31/2020  ? Procedure: OPEN REDUCTION INTERNAL FIXATION (ORIF) ANKLE FRACTURE;  Surgeon: Altamese [place], MD;  Location: [place];  Service: Orthopedics;  Laterality: Left;  ? ORIF ANKLE FRACTURE Left 04/05/2020  ? Procedure: INTERNAL FIXATION LEFT TIBIA TO CALCANEUS AND APPLY SKIN GRAFT;  Surgeon: [name], MD;  Location: [facility];  Service: Orthopedics;  Laterality: Left;  ? SHOULDER SURGERY Left 1998  ? TONSILLECTOMY    ? ? ?Current Medications: ?No outpatient medications have been marked as taking for the 06/18/21 encounter (Appointment) with Richardo Priest, MD.  ?  ? ?Allergies:   Bee venom, Penicillins, and Aspirin  ? ?Social History  ? ?Socioeconomic History  ? Marital status: Married  ?  Spouse name: Not on file  ? Number of children: Not on file  ? Years of education: Not on file  ? Highest education level: Not on file  ?Occupational History  ? Not on file  ?Tobacco Use  ? Smoking status: Former  ?  Packs/day: 3.00  ?  Years: 35.00  ?  Pack years: 105.00  ?  Types: Cigarettes  ?  Quit date: 04/13/2006  ?  Years since quitting: 15.1  ? Smokeless tobacco: Never  ?Vaping Use  ? Vaping Use: Never used  ?Substance and Sexual Activity  ? Alcohol use: No  ?  Alcohol/week: 0.0 standard drinks  ? Drug use: No  ? Sexual activity: Not on file  ?Other Topics Concern   ? Not on file  ?Social History Narrative  ? Not on file  ? ?Social Determinants of Health  ? ?Financial Resource Strain: Not on file  ?Food Insecurity: Not on file  ?Transportation Needs: Not on file  ?Physical Activity: Not on file  ?Stress: Not on file  ?Social Connections: Not on file  ?  ? ?Family History: ?The patient's ***family history includes Atrial fibrillation in his father; Breast cancer in his maternal grandmother and mother; CAD in his father; Heart disease in his father and paternal uncle; Stomach cancer in his paternal aunt. ?ROS:   ?Please see the history of present illness.    ?All other systems reviewed and are negative. ? ?EKGs/Labs/Other Studies Reviewed:   ? ?The following studies were reviewed today: ? ?EKG:  EKG  ordered today and personally reviewed.  The ekg ordered today demonstrates *** ? ?Recent Labs: ?09/11/2020: NT-Pro BNP 669 ?03/02/2021: BUN 14; Creatinine, Ser 0.94; Hemoglobin 15.4; Platelets 246; Potassium 3.5; Sodium 139  ?Recent Lipid Panel ?   ?Component Value Date/Time  ? CHOL 156 12/03/2018 1502  ? TRIG 301 (H) 12/03/2018 1502  ? HDL 29 (L) 12/03/2018 1502  ? CHOLHDL 5.4 (H) 12/03/2018 1502  ? Christine 78 12/03/2018 1502  ? ? ?Physical Exam:   ? ?VS:  There were no vitals taken for this visit.   ? ?Wt Readings from Last 3 Encounters:  ?02/20/21 (!) 360 lb (163.3 kg)  ?12/12/20 (!) 360 lb (163.3 kg)  ?10/04/20 (!) 380 lb (172.4 kg)  ?  ? ?GEN: *** Well nourished, well developed in no acute distress ?HEENT: Normal ?NECK: No JVD; No carotid bruits ?LYMPHATICS: No lymphadenopathy ?CARDIAC: ***RRR, no murmurs, rubs, gallops ?RESPIRATORY:  Clear to auscultation without rales, wheezing or rhonchi  ?ABDOMEN: Soft, non-tender, non-distended ?MUSCULOSKELETAL:  No edema; No deformity  ?SKIN: Warm and dry ?NEUROLOGIC:  Alert and oriented x 3 ?PSYCHIATRIC:  Normal affect  ? ? ?Signed, ?Shirlee More, MD  ?06/16/2021 10:48 AM    ?[place]  ?

## 2021-06-18 ENCOUNTER — Ambulatory Visit: Payer: Medicare HMO | Admitting: Cardiology

## 2021-06-18 ENCOUNTER — Encounter: Payer: Self-pay | Admitting: Cardiology

## 2021-06-18 VITALS — BP 120/90 | HR 84 | Ht 74.0 in | Wt 378.0 lb

## 2021-06-18 DIAGNOSIS — I5042 Chronic combined systolic (congestive) and diastolic (congestive) heart failure: Secondary | ICD-10-CM

## 2021-06-18 DIAGNOSIS — E782 Mixed hyperlipidemia: Secondary | ICD-10-CM

## 2021-06-18 DIAGNOSIS — Z7901 Long term (current) use of anticoagulants: Secondary | ICD-10-CM

## 2021-06-18 DIAGNOSIS — I110 Hypertensive heart disease with heart failure: Secondary | ICD-10-CM | POA: Diagnosis not present

## 2021-06-18 DIAGNOSIS — I4819 Other persistent atrial fibrillation: Secondary | ICD-10-CM | POA: Diagnosis not present

## 2021-06-18 MED ORDER — VALSARTAN 80 MG PO TABS: 80.0000 mg | ORAL_TABLET | Freq: Two times a day (BID) | ORAL | 3 refills | Status: DC

## 2021-06-18 NOTE — Patient Instructions (Addendum)
Medication Instructions:  ?Your physician has recommended you make the following change in your medication:  ? ?STOP: Entresto ?START: Valsartan 80 mg twice daily ? ?*If you need a refill on your cardiac medications before your next appointment, please call your pharmacy* ? ? ?Lab Work: ?Your physician recommends that you return for lab work in:  ? ?Labs today: BMP, Pro BNP ? ?If you have labs (blood work) drawn today and your tests are completely normal, you will receive your results only by: ?MyChart Message (if you have MyChart) OR ?A paper copy in the mail ?If you have any lab test that is abnormal or we need to change your treatment, we will call you to review the results. ? ? ?Testing/Procedures: ?Your physician has requested that you have an echocardiogram. Echocardiography is a painless test that uses sound waves to create images of your heart. It provides your doctor with information about the size and shape of your heart and how well your heart?s chambers and valves are working. This procedure takes approximately one hour. There are no restrictions for this procedure.  ? ? ?Follow-Up: ?At [facility], you and your health needs are our priority.  As part of our continuing mission to provide you with exceptional heart care, we have created designated Provider Care Teams.  These Care Teams include your primary Cardiologist (physician) and Advanced Practice Providers (APPs -  Physician Assistants and Nurse Practitioners) who all work together to provide you with the care you need, when you need it. ? ?We recommend signing up for the patient portal called "MyChart".  Sign up information is provided on this After Visit Summary.  MyChart is used to connect with patients for Virtual Visits (Telemedicine).  Patients are able to view lab/test results, encounter notes, upcoming appointments, etc.  Non-urgent messages can be sent to your provider as well.   ?To learn more about what you can do with MyChart, go to  [email or web address].   ? ?Your next appointment:   ?6 month(s) ? ?The format for your next appointment:   ?In Person ? ?Provider:   ?Shirlee More, MD  ? ? ?Other Instructions ?None ? ?Important Information About Sugar ? ? ? ? ? ? ?

## 2021-06-18 NOTE — Progress Notes (Signed)
?Cardiology Office Note:   ? ?Date:  06/18/2021  ? ?ID:  David Fitzgerald, DOB 03-23-1960, MRN [id number] ? ?PCP:  Angelina Sheriff, MD  ?Cardiologist:  Shirlee More, MD   ? ?Referring MD: Angelina Sheriff, MD  ? ? ?ASSESSMENT:   ? ?1. Persistent atrial fibrillation ([place])   ?2. Chronic anticoagulation   ?3. Hypertensive heart disease with chronic combined systolic and diastolic congestive heart failure ([place])   ? ?PLAN:   ? ?In order of problems listed above: ? ?Rate controlled obviously decompensated in the hospital when his beta-blocker was stopped continue metoprolol and his current anticoagulant ?Improved we will continue his twice daily diuretic and will transition Entresto to valsartan to try to meet financial need recheck an echocardiogram with [facility] before his next visit ? ? ?Next appointment: 6 months ? ? ?Medication Adjustments/Labs and Tests Ordered: ?Current medicines are reviewed at length with the patient today.  Concerns regarding medicines are outlined above.  ?No orders of the defined types were placed in this encounter. ? ?No orders of the defined types were placed in this encounter. ? ? ?Chief Complaint  ?Patient presents with  ? Follow-up  ? ? ?History of Present Illness:   ? ?David Fitzgerald is a 61 y.o. male with a hx of permanent atrial fibrillation with chronic anticoagulation and hypertensive heart disease with heart failure last seen 09/11/2020.  His echocardiogram 08/18/2020 showed an ejection fraction 40 to 45% with moderate concentric LVH his proBNP level was elevated was placed on a loop diuretic and Entresto SGLT2 inhibitor last seen 12/12/2020. ? ?Compliance with diet, lifestyle and medications: Yes ? ?He is taking a higher dose of loop diuretic ? ?Unfortunately had a fall trauma left elbow multiple surgical procedures in his diuretic and rate slowing medications were held for multiple days resulting in deterioration. ?I attempted an echocardiogram [facility] in February  was nondiagnostic ?He is now engaged in physical therapy ?Heart rate blood pressure controlled at home not having edema no shortness of breath chest pain palpitation or syncope ?They tell me they cannot afford both Entresto and apixaban and we will transition from Entresto to valsartan to try to [place] financial need ?Recheck echocardiogram a week before his next visit ? ? ?Recent labs [facility] 03/29/2021 GFR 75 cc creatinine 1.12 potassium 4.2 hemoglobin 13.7 ? ? ?Past Surgical History:  ?Procedure Laterality Date  ?  INTERNAL FIXATION LEFT TIBIA TO CALCANEUS AND APPLY SKIN GRAFT (Left Ankle)  04/05/2020  ? ADENOIDECTOMY    ? AMPUTATION Left 10/04/2020  ? Procedure: LEFT BELOW KNEE AMPUTATION;  Surgeon: [name], MD;  Location: [place];  Service: Orthopedics;  Laterality: Left;  ? BACK SURGERY  2002 /2003  ? CARPAL TUNNEL RELEASE Bilateral   ? ELBOW LIGAMENT RECONSTRUCTION Left 03/02/2021  ? Procedure: LEFT ELBOW LATERAL ULNAR COLLATERAL LIGAMENT REPAIR/ POSSIBLE MEDIAL LIGAMENT REPAIR;  Surgeon: Sherilyn Cooter, MD;  Location: [place];  Service: Orthopedics;  Laterality: Left;  ? I & D EXTREMITY Left 01/31/2020  ? Procedure: IRRIGATION AND DEBRIDEMENT ANKLE;  Surgeon: Altamese [place], MD;  Location: [place];  Service: Orthopedics;  Laterality: Left;  ? I & D EXTREMITY Left 03/10/2020  ? Procedure: DEBRIDEMENT LEFT ANKLE, APPLY SKIN GRAFT;  Surgeon: [name], MD;  Location: [place];  Service: Orthopedics;  Laterality: Left;  ? KNEE SURGERY Left 2005  ? ORIF ANKLE FRACTURE Left 01/31/2020  ? ORIF ANKLE FRACTURE Left 01/31/2020  ? Procedure:  OPEN REDUCTION INTERNAL FIXATION (ORIF) ANKLE FRACTURE;  Surgeon: Altamese [place], MD;  Location: [place];  Service: Orthopedics;  Laterality: Left;  ? ORIF ANKLE FRACTURE Left 04/05/2020  ? Procedure: INTERNAL FIXATION LEFT TIBIA TO CALCANEUS AND APPLY SKIN GRAFT;  Surgeon: [name], MD;  Location: [place];  Service: Orthopedics;  Laterality: Left;  ? SHOULDER  SURGERY Left 1998  ? TONSILLECTOMY    ? ? ?Current Medications: ?Current Meds  ?Medication Sig  ? apixaban (ELIQUIS) 5 MG TABS tablet Take 1 tablet (5 mg total) by mouth 2 (two) times daily.  ? ascorbic acid (VITAMIN C) 1000 MG tablet Take 1,000 mg by mouth daily.  ? Cholecalciferol (VITAMIN D3) 125 MCG (5000 UT) TABS Take 5,000 Units by mouth daily.  ? Cyanocobalamin (B-12) 2500 MCG TABS Take 2,500 mcg by mouth daily.  ? diazepam (VALIUM) 5 MG tablet Take 5 mg by mouth every 6 (six) hours as needed for anxiety.  ? DULoxetine (CYMBALTA) 30 MG capsule Take 30 mg by mouth at bedtime.  ? DULoxetine (CYMBALTA) 60 MG capsule Take 60 mg by mouth every morning.  ? EPINEPHrine 0.3 mg/0.3 mL IJ SOAJ injection Inject 0.3 mg into the muscle as needed for anaphylaxis.  ? furosemide (LASIX) 40 MG tablet Take 40 mg by mouth daily.  ? gabapentin (NEURONTIN) 600 MG tablet Take 600 mg by mouth 3 (three) times daily.  ? metFORMIN (GLUCOPHAGE) 500 MG tablet Take 500 mg by mouth daily with breakfast.  ? metoprolol succinate (TOPROL-XL) 50 MG 24 hr tablet Take 150 mg by mouth daily.  ? naloxone (NARCAN) nasal spray 4 mg/0.1 mL Place 1 spray into the nose as needed for opioid reversal.  ? nitroGLYCERIN (NITROSTAT) 0.4 MG SL tablet Place 1 tablet (0.4 mg total) under the tongue every 5 (five) minutes as needed for chest pain.  ? oxyCODONE-acetaminophen (PERCOCET) 10-325 MG tablet Take 1 tablet by mouth every 4 (four) hours as needed for pain.  ? pantoprazole (PROTONIX) 40 MG tablet Take 40 mg by mouth daily.  ? simvastatin (ZOCOR) 20 MG tablet Take 20 mg by mouth at bedtime.  ? [DISCONTINUED] sacubitril-valsartan (ENTRESTO) 24-26 MG Take 1 tablet by mouth 2 (two) times daily.  ?  ? ?Allergies:   Bee venom, Penicillins, and Aspirin  ? ?Social History  ? ?Socioeconomic History  ? Marital status: Married  ?  Spouse name: Not on file  ? Number of children: Not on file  ? Years of education: Not on file  ? Highest education level: Not on file   ?Occupational History  ? Not on file  ?Tobacco Use  ? Smoking status: Former  ?  Packs/day: 3.00  ?  Years: 35.00  ?  Pack years: 105.00  ?  Types: Cigarettes  ?  Quit date: 04/13/2006  ?  Years since quitting: 15.1  ? Smokeless tobacco: Never  ?Vaping Use  ? Vaping Use: Never used  ?Substance and Sexual Activity  ? Alcohol use: No  ?  Alcohol/week: 0.0 standard drinks  ? Drug use: No  ? Sexual activity: Not on file  ?Other Topics Concern  ? Not on file  ?Social History Narrative  ? Not on file  ? ?Social Determinants of Health  ? ?Financial Resource Strain: Not on file  ?Food Insecurity: Not on file  ?Transportation Needs: Not on file  ?Physical Activity: Not on file  ?Stress: Not on file  ?Social Connections: Not on file  ?  ? ?Family History: ?The patient's  family history includes Atrial fibrillation in his father; Breast cancer in his maternal grandmother and mother; CAD in his father; Heart disease in his father and paternal uncle; Stomach cancer in his paternal aunt. ?ROS:   ?Please see the history of present illness.    ?All other systems reviewed and are negative. ? ?EKGs/Labs/Other Studies Reviewed:   ? ?The following studies were reviewed today: ? ? ?Recent Labs ?09/11/2020: NT-Pro BNP 669 ?03/02/2021: BUN 14; Creatinine, Ser 0.94; Hemoglobin 15.4; Platelets 246; Potassium 3.5; Sodium 139  ?Recent Lipid Panel ?   ?Component Value Date/Time  ? CHOL 156 12/03/2018 1502  ? TRIG 301 (H) 12/03/2018 1502  ? HDL 29 (L) 12/03/2018 1502  ? CHOLHDL 5.4 (H) 12/03/2018 1502  ? [place] 78 12/03/2018 1502  ? ? ?Physical Exam:   ? ?VS:  BP 120/90 (BP Location: Right Arm, Patient Position: Sitting, Cuff Size: Large)   Pulse 84   Ht '6\' 2"'$  (1.88 m)   Wt (!) 378 lb (171.5 kg)   SpO2 94%   BMI 48.53 kg/m?    ? ?Wt Readings from Last 3 Encounters:  ?06/18/21 (!) 378 lb (171.5 kg)  ?02/20/21 (!) 360 lb (163.3 kg)  ?12/12/20 (!) 360 lb (163.3 kg)  ?  ? ?GEN: obese Well nourished, well developed in no acute distress ?HEENT:  Normal ?NECK: No JVD; No carotid bruits ?LYMPHATICS: No lymphadenopathy ?CARDIAC: Irregular rate and rhythm  ?RESPIRATORY:  Clear to auscultation without rales, wheezing or rhonchi  ?ABDOMEN: Soft, non-tender, non-d

## 2021-06-19 ENCOUNTER — Telehealth: Payer: Self-pay

## 2021-06-19 LAB — BASIC METABOLIC PANEL
BUN/Creatinine Ratio: 13 (ref 10–24)
BUN: 14 mg/dL (ref 8–27)
CO2: 22 mmol/L (ref 20–29)
Calcium: 10.4 mg/dL — ABNORMAL HIGH (ref 8.6–10.2)
Chloride: 95 mmol/L — ABNORMAL LOW (ref 96–106)
Creatinine, Ser: 1.1 mg/dL (ref 0.76–1.27)
Glucose: 169 mg/dL — ABNORMAL HIGH (ref 70–99)
Potassium: 4.2 mmol/L (ref 3.5–5.2)
Sodium: 139 mmol/L (ref 134–144)
eGFR: 76 mL/min/{1.73_m2} (ref >59)

## 2021-06-19 LAB — PRO B NATRIURETIC PEPTIDE: NT-Pro BNP: 280 pg/mL — ABNORMAL HIGH (ref 0–210)

## 2021-06-19 NOTE — Telephone Encounter (Signed)
-----   Message from Richardo Priest, MD sent at 06/19/2021  7:28 AM EDT ----- ?Normal or stable result ? ?No changes ?

## 2021-06-19 NOTE — Telephone Encounter (Signed)
Patient notified of result. 

## 2021-06-21 ENCOUNTER — Telehealth: Payer: Self-pay | Admitting: Cardiology

## 2021-06-21 ENCOUNTER — Ambulatory Visit (INDEPENDENT_AMBULATORY_CARE_PROVIDER_SITE_OTHER): Payer: Medicare HMO

## 2021-06-21 ENCOUNTER — Ambulatory Visit (INDEPENDENT_AMBULATORY_CARE_PROVIDER_SITE_OTHER): Payer: Medicare HMO | Admitting: *Deleted

## 2021-06-21 DIAGNOSIS — I110 Hypertensive heart disease with heart failure: Secondary | ICD-10-CM | POA: Diagnosis not present

## 2021-06-21 DIAGNOSIS — I5042 Chronic combined systolic (congestive) and diastolic (congestive) heart failure: Secondary | ICD-10-CM

## 2021-06-21 DIAGNOSIS — I4819 Other persistent atrial fibrillation: Secondary | ICD-10-CM

## 2021-06-21 DIAGNOSIS — Z7901 Long term (current) use of anticoagulants: Secondary | ICD-10-CM

## 2021-06-21 DIAGNOSIS — T63441D Toxic effect of venom of bees, accidental (unintentional), subsequent encounter: Secondary | ICD-10-CM

## 2021-06-21 LAB — ECHOCARDIOGRAM COMPLETE: Area-P 1/2: 3.6 cm2

## 2021-06-21 NOTE — Telephone Encounter (Signed)
Patient stated he needs a refill on Spironolactone 25 MG one tablet by mouth daily. He stated he was prescribed this medication during a previous hospital visit.

## 2021-06-21 NOTE — Telephone Encounter (Signed)
LM to return my call. 

## 2021-06-22 NOTE — Telephone Encounter (Signed)
Patient return call. ?

## 2021-06-25 DIAGNOSIS — Z885 Allergy status to narcotic agent status: Secondary | ICD-10-CM | POA: Diagnosis not present

## 2021-06-25 DIAGNOSIS — Z880 Allergy status to penicillin: Secondary | ICD-10-CM | POA: Diagnosis not present

## 2021-06-25 DIAGNOSIS — Z886 Allergy status to analgesic agent status: Secondary | ICD-10-CM | POA: Diagnosis not present

## 2021-06-25 DIAGNOSIS — S53125D Posterior dislocation of left ulnohumeral joint, subsequent encounter: Secondary | ICD-10-CM | POA: Diagnosis not present

## 2021-06-25 MED ORDER — SPIRONOLACTONE 25 MG PO TABS: 25.0000 mg | ORAL_TABLET | Freq: Every day | ORAL | 3 refills | Status: DC

## 2021-06-25 NOTE — Addendum Note (Signed)
Addended by: Antonieta Iba on: 06/25/2021 12:22 PM   Modules accepted: Orders

## 2021-06-25 NOTE — Telephone Encounter (Signed)
Patient states that he was started on spironolactone 25 mg daily several months ago during a hospitalization for elbow surgery. He states that his blood pressure was up and he was retaining fluids. He states that since being on the spironolactone his BP has been good and he has not been retaining fluids. He was wanting to see if Dr. Bettina Gavia would refill this for him.

## 2021-06-25 NOTE — Telephone Encounter (Signed)
Spoke with the patient and advised that a prescription for spironolactone has been sent in for him. Patient verbalized understanding.

## 2021-06-26 DIAGNOSIS — Z4789 Encounter for other orthopedic aftercare: Secondary | ICD-10-CM | POA: Diagnosis not present

## 2021-06-26 DIAGNOSIS — M25322 Other instability, left elbow: Secondary | ICD-10-CM | POA: Diagnosis not present

## 2021-06-28 DIAGNOSIS — Z1389 Encounter for screening for other disorder: Secondary | ICD-10-CM | POA: Diagnosis not present

## 2021-06-28 DIAGNOSIS — M25552 Pain in left hip: Secondary | ICD-10-CM | POA: Diagnosis not present

## 2021-06-28 DIAGNOSIS — M5136 Other intervertebral disc degeneration, lumbar region: Secondary | ICD-10-CM | POA: Diagnosis not present

## 2021-06-28 DIAGNOSIS — Z79899 Other long term (current) drug therapy: Secondary | ICD-10-CM | POA: Diagnosis not present

## 2021-06-28 DIAGNOSIS — M1612 Unilateral primary osteoarthritis, left hip: Secondary | ICD-10-CM | POA: Diagnosis not present

## 2021-06-28 DIAGNOSIS — M47816 Spondylosis without myelopathy or radiculopathy, lumbar region: Secondary | ICD-10-CM | POA: Diagnosis not present

## 2021-06-28 DIAGNOSIS — G894 Chronic pain syndrome: Secondary | ICD-10-CM | POA: Diagnosis not present

## 2021-06-28 DIAGNOSIS — Z79891 Long term (current) use of opiate analgesic: Secondary | ICD-10-CM | POA: Diagnosis not present

## 2021-07-09 ENCOUNTER — Telehealth: Payer: Self-pay | Admitting: Cardiology

## 2021-07-09 NOTE — Telephone Encounter (Signed)
Pt c/o medication issue:  1. Name of Medication:  metoprolol succinate (TOPROL-XL) 50 MG 24 hr tablet  2. How are you currently taking this medication (dosage and times per day)? 3 tablets in the morning   3. Are you having a reaction (difficulty breathing--STAT)? No   4. What is your medication issue? Patient is calling stating he needs PA for this medication, he is currently out. States he has regular metoprolol and is wanting to know if he should take that until he gets his refill.

## 2021-07-17 NOTE — Telephone Encounter (Signed)
David Fitzgerald Key: [id number] help? Call us at [phone] Outcome Additional Information Required Available without authorization. Drug Toprol XL '50MG'$  er tablets Acupuncturist Electronic PA Form

## 2021-07-23 ENCOUNTER — Other Ambulatory Visit: Payer: Self-pay

## 2021-07-23 ENCOUNTER — Telehealth: Payer: Self-pay | Admitting: Cardiology

## 2021-07-23 ENCOUNTER — Encounter: Payer: Self-pay | Admitting: Cardiology

## 2021-07-23 MED ORDER — METOPROLOL SUCCINATE ER 50 MG PO TB24: 150.0000 mg | ORAL_TABLET | Freq: Every day | ORAL | 3 refills | Status: DC

## 2021-07-23 NOTE — Telephone Encounter (Signed)
*  STAT* If patient is at the pharmacy, call can be transferred to refill team.   1. Which medications need to be refilled? (please list name of each medication and dose if known)  metoprolol succinate (TOPROL-XL) 50 MG 24 hr tablet  2. Which pharmacy/location (including street and city if local pharmacy) is medication to be sent to?  [id number] - [place], [place] - [address] AT [address]  3. Do they need a 30 day or 90 day supply?    90 day

## 2021-07-23 NOTE — Telephone Encounter (Signed)
Duplicate encounter

## 2021-07-24 ENCOUNTER — Other Ambulatory Visit: Payer: Self-pay

## 2021-07-24 ENCOUNTER — Telehealth: Payer: Self-pay

## 2021-07-24 ENCOUNTER — Other Ambulatory Visit: Payer: Self-pay | Admitting: Cardiology

## 2021-07-24 DIAGNOSIS — I10 Essential (primary) hypertension: Secondary | ICD-10-CM

## 2021-07-24 NOTE — Telephone Encounter (Signed)
PA approved for Metoprolol Succinate through 02/03/2022 Copy of letter in chart

## 2021-07-30 DIAGNOSIS — Z1389 Encounter for screening for other disorder: Secondary | ICD-10-CM | POA: Diagnosis not present

## 2021-07-30 DIAGNOSIS — M47816 Spondylosis without myelopathy or radiculopathy, lumbar region: Secondary | ICD-10-CM | POA: Diagnosis not present

## 2021-07-30 DIAGNOSIS — G894 Chronic pain syndrome: Secondary | ICD-10-CM | POA: Diagnosis not present

## 2021-07-30 DIAGNOSIS — M5136 Other intervertebral disc degeneration, lumbar region: Secondary | ICD-10-CM | POA: Diagnosis not present

## 2021-07-30 DIAGNOSIS — M1612 Unilateral primary osteoarthritis, left hip: Secondary | ICD-10-CM | POA: Diagnosis not present

## 2021-07-30 DIAGNOSIS — Z79891 Long term (current) use of opiate analgesic: Secondary | ICD-10-CM | POA: Diagnosis not present

## 2021-07-30 DIAGNOSIS — M25552 Pain in left hip: Secondary | ICD-10-CM | POA: Diagnosis not present

## 2021-07-30 DIAGNOSIS — Z79899 Other long term (current) drug therapy: Secondary | ICD-10-CM | POA: Diagnosis not present

## 2021-07-31 ENCOUNTER — Ambulatory Visit (INDEPENDENT_AMBULATORY_CARE_PROVIDER_SITE_OTHER): Payer: Medicare HMO

## 2021-07-31 DIAGNOSIS — I10 Essential (primary) hypertension: Secondary | ICD-10-CM

## 2021-07-31 MED ORDER — PERFLUTREN LIPID MICROSPHERE
1.0000 mL | INTRAVENOUS | Status: AC | PRN
  Administered 2021-07-31: 10 mL via INTRAVENOUS

## 2021-08-09 ENCOUNTER — Other Ambulatory Visit: Payer: Self-pay | Admitting: *Deleted

## 2021-08-09 DIAGNOSIS — I4819 Other persistent atrial fibrillation: Secondary | ICD-10-CM

## 2021-08-09 MED ORDER — APIXABAN 5 MG PO TABS: 5.0000 mg | ORAL_TABLET | Freq: Two times a day (BID) | ORAL | 2 refills | Status: DC

## 2021-08-09 NOTE — Telephone Encounter (Signed)
Eliquis '5mg'$  refill request received. Patient is 61 years old, weight-171.5kg, Crea-1.10 on 06/18/2021, Diagnosis-Afib, and last seen by Dr. Bettina Gavia on 06/18/2021. Dose is appropriate based on dosing criteria. Will send in refill to requested pharmacy.

## 2021-08-16 ENCOUNTER — Ambulatory Visit: Payer: Medicare HMO

## 2021-08-23 ENCOUNTER — Ambulatory Visit (INDEPENDENT_AMBULATORY_CARE_PROVIDER_SITE_OTHER): Payer: Medicare HMO | Admitting: *Deleted

## 2021-08-23 DIAGNOSIS — E1165 Type 2 diabetes mellitus with hyperglycemia: Secondary | ICD-10-CM | POA: Diagnosis not present

## 2021-08-23 DIAGNOSIS — E7800 Pure hypercholesterolemia, unspecified: Secondary | ICD-10-CM | POA: Diagnosis not present

## 2021-08-23 DIAGNOSIS — M1990 Unspecified osteoarthritis, unspecified site: Secondary | ICD-10-CM | POA: Diagnosis not present

## 2021-08-23 DIAGNOSIS — Z89512 Acquired absence of left leg below knee: Secondary | ICD-10-CM | POA: Diagnosis not present

## 2021-08-23 DIAGNOSIS — L989 Disorder of the skin and subcutaneous tissue, unspecified: Secondary | ICD-10-CM | POA: Diagnosis not present

## 2021-08-23 DIAGNOSIS — T63441D Toxic effect of venom of bees, accidental (unintentional), subsequent encounter: Secondary | ICD-10-CM

## 2021-08-23 DIAGNOSIS — E7439 Other disorders of intestinal carbohydrate absorption: Secondary | ICD-10-CM | POA: Diagnosis not present

## 2021-08-23 DIAGNOSIS — I10 Essential (primary) hypertension: Secondary | ICD-10-CM | POA: Diagnosis not present

## 2021-08-28 DIAGNOSIS — I739 Peripheral vascular disease, unspecified: Secondary | ICD-10-CM | POA: Diagnosis not present

## 2021-09-03 ENCOUNTER — Other Ambulatory Visit: Payer: Self-pay | Admitting: *Deleted

## 2021-09-03 DIAGNOSIS — I739 Peripheral vascular disease, unspecified: Secondary | ICD-10-CM

## 2021-09-03 NOTE — Progress Notes (Unsigned)
[facility] [place]  ASSESSMENT / PLAN: David Fitzgerald is a 61 y.o. male with atherosclerosis of native arteries of right lower extremity causing no symptoms.  Patient counseled patients with asymptomatic peripheral arterial disease or claudication have a 1-2% risk of developing chronic limb threatening ischemia, but a 15-30% risk of mortality in the next 5 years. Intervention should only be considered for medically optimized patients with disabling symptoms.   Recommend the following which can slow the progression of atherosclerosis and reduce the risk of major adverse cardiac / limb events:  Complete cessation from all tobacco products. Blood glucose control with goal A1c < 7%. Blood pressure control with goal blood pressure < 140/90 mmHg. Lipid reduction therapy with goal LDL-C <100 mg/dL (<70 if symptomatic from PAD).  Aspirin '81mg'$  PO QD.  Atorvastatin 40-'80mg'$  PO QD (or other "high intensity" statin therapy).  Patient can follow up with me as needed.  CHIEF COMPLAINT: history of left below knee amputation  HISTORY OF PRESENT ILLNESS: David Fitzgerald is a 61 y.o. male who suffered a complex left foot fracture complicated by hardware infection.  Despite extensive limb salvage efforts by Dr. Sharol Given, this ultimately underwent a left below-knee amputation.  Patient is referred for evaluation of his right lower extremity arterial circulation, given his requirement for an amputation on the other side.  The patient is a morbidly obese gentleman with multiple medical problems.  He is anticoagulated for atrial fibrillation.  He is currently rehabbing and trying to get back walking.  Unfortunately he suffered another fall and fractured his left elbow requiring external fixation.  He has limited mobility of this arm.   Past Medical History:  Diagnosis Date   A-fib [facility])    Abscess of ankle    Abscess of bursa of left ankle 10/04/2020   Allergy to pollen 04/06/2015   Ankle  fracture 01/31/2020   Anxiety 02/06/2017   Arthritis 02/06/2017   Asthma    as a child   Atrial fibrillation with rapid ventricular response ([place]) 12/08/2020   Bee sting allergy    wasp / mixed vespid   Bell's palsy 02/24/2013   BMI 45.0-49.9, adult ([place]) 07/09/2012   CAD in native artery 02/07/2017   CHF (congestive heart failure) (HCC)    Chronic anticoagulation 03/07/2017   Chronic pain syndrome 07/09/2012   Chronic venous insufficiency 12/14/2014   Chronic, continuous use of opioids 02/15/2020   Coronary artery calcification seen on CT scan 02/07/2017   Depression    Diabetes ([place])    Dyspnea on minimal exertion 12/08/2020   Dysrhythmia    afib   Edema 12/14/2014   Epigastric pain 12/08/2020   GERD (gastroesophageal reflux disease)    Hematuria 12/08/2020   History of kidney stones    Hyperlipidemia, mixed 12/14/2014   Hypertension    Hypertensive heart disease 12/14/2014   Hypotension 12/25/2017   IBS (irritable bowel syndrome)    Instability of left elbow joint 02/22/2021   Left ankle pain 04/05/2020   Lumbar pain with radiation down left leg 07/09/2012   Morbid obesity ([place]) 12/14/2014   Morbid obesity with BMI of 40.0-44.9, adult ([place]) 12/14/2014   Morbid obesity with BMI of 45.0-49.9, adult ([place]) 07/09/2012   Nausea & vomiting 12/08/2020   Obesity hypoventilation syndrome ([place]) 12/14/2014   Open dislocation of left ankle    Open fracture dislocation of left ankle 01/31/2020   OSA (obstructive sleep apnea) 03/09/2020   doesn't use a Cpap   OSA treated  with BiPAP 12/14/2014   Pain syndrome, chronic 07/09/2012   Permanent atrial fibrillation (HCC)    Pernicious anemia 02/06/2017   Persistent atrial fibrillation ([place]) 02/06/2017   CHADS2vasc of 2   Pneumonia    Polyneuropathy in diabetes ([place]) 02/24/2013   Subacute osteomyelitis, left ankle and foot ([place])    Syndrome affecting cervical region 02/24/2013   Trigeminal neuralgia 02/24/2013   Type 2 diabetes  mellitus with diabetic neuropathy ([place]) 12/14/2014   Type II diabetes mellitus, uncontrolled 12/08/2020   Vitamin D deficiency 02/16/2020   Wound dehiscence, traumatic injury repair, left medial ankle  03/08/2020    Past Surgical History:  Procedure Laterality Date    INTERNAL FIXATION LEFT TIBIA TO CALCANEUS AND APPLY SKIN GRAFT (Left Ankle)  04/05/2020   ADENOIDECTOMY     AMPUTATION Left 10/04/2020   Procedure: LEFT BELOW KNEE AMPUTATION;  Surgeon: [name], MD;  Location: [place];  Service: Orthopedics;  Laterality: Left;   BACK SURGERY  2002 /2003   CARPAL TUNNEL RELEASE Bilateral    ELBOW LIGAMENT RECONSTRUCTION Left 03/02/2021   Procedure: LEFT ELBOW LATERAL ULNAR COLLATERAL LIGAMENT REPAIR/ POSSIBLE MEDIAL LIGAMENT REPAIR;  Surgeon: Sherilyn Cooter, MD;  Location: [place];  Service: Orthopedics;  Laterality: Left;   I & D EXTREMITY Left 01/31/2020   Procedure: IRRIGATION AND DEBRIDEMENT ANKLE;  Surgeon: Altamese [place], MD;  Location: [place];  Service: Orthopedics;  Laterality: Left;   I & D EXTREMITY Left 03/10/2020   Procedure: DEBRIDEMENT LEFT ANKLE, APPLY SKIN GRAFT;  Surgeon: [name], MD;  Location: [place];  Service: Orthopedics;  Laterality: Left;   KNEE SURGERY Left 2005   ORIF ANKLE FRACTURE Left 01/31/2020   ORIF ANKLE FRACTURE Left 01/31/2020   Procedure: OPEN REDUCTION INTERNAL FIXATION (ORIF) ANKLE FRACTURE;  Surgeon: Altamese , MD;  Location: [place];  Service: Orthopedics;  Laterality: Left;   ORIF ANKLE FRACTURE Left 04/05/2020   Procedure: INTERNAL FIXATION LEFT TIBIA TO CALCANEUS AND APPLY SKIN GRAFT;  Surgeon: [name], MD;  Location: [place];  Service: Orthopedics;  Laterality: Left;   SHOULDER SURGERY Left 1998   TONSILLECTOMY      Family History  Problem Relation Age of Onset   Breast cancer Mother    Heart disease Father    CAD Father    Atrial fibrillation Father    Stomach cancer Paternal Aunt    Heart disease Paternal Uncle    Breast  cancer Maternal Grandmother     Social History   Socioeconomic History   Marital status: Married    Spouse name: Not on file   Number of children: Not on file   Years of education: Not on file   Highest education level: Not on file  Occupational History   Not on file  Tobacco Use   Smoking status: Former    Packs/day: 3.00    Years: 35.00    Total pack years: 105.00    Types: Cigarettes    Quit date: 04/13/2006    Years since quitting: 15.4   Smokeless tobacco: Never  Vaping Use   Vaping Use: Never used  Substance and Sexual Activity   Alcohol use: No    Alcohol/week: 0.0 standard drinks of alcohol   Drug use: No   Sexual activity: Not on file  Other Topics Concern   Not on file  Social History Narrative   Not on file   Social Determinants of Health   Financial Resource Strain: Not on file  Food Insecurity: Not on file  Transportation Needs: No Transportation Needs (04/13/2020)   PRAPARE - Hydrologist (Medical): No    Lack of Transportation (Non-Medical): No  Physical Activity: Not on file  Stress: Not on file  Social Connections: Not on file  Intimate Partner Violence: Not on file    Allergies  Allergen Reactions   Bee Venom Anaphylaxis and Swelling   Penicillins Swelling and Other (See Comments)    CEPHALOSPORIN TOLERANT 01/31/2020- "Allergic," per paperwork from facility   Aspirin Other (See Comments)    Relative contraindication due to hematochezia while on it- "Allergic," per paperwork from facility    Current Outpatient Medications  Medication Sig Dispense Refill   apixaban (ELIQUIS) 5 MG TABS tablet Take 1 tablet (5 mg total) by mouth 2 (two) times daily. 180 tablet 2   ascorbic acid (VITAMIN C) 1000 MG tablet Take 1,000 mg by mouth daily.     Cholecalciferol (VITAMIN D3) 125 MCG (5000 UT) TABS Take 5,000 Units by mouth daily.     Cyanocobalamin (B-12) 2500 MCG TABS Take 2,500 mcg by mouth daily.     diazepam (VALIUM) 5  MG tablet Take 5 mg by mouth every 6 (six) hours as needed for anxiety.     DULoxetine (CYMBALTA) 30 MG capsule Take 30 mg by mouth at bedtime.     DULoxetine (CYMBALTA) 60 MG capsule Take 60 mg by mouth every morning.     EPINEPHrine 0.3 mg/0.3 mL IJ SOAJ injection Inject 0.3 mg into the muscle as needed for anaphylaxis.     furosemide (LASIX) 40 MG tablet Take 40 mg by mouth daily.     gabapentin (NEURONTIN) 600 MG tablet Take 600 mg by mouth 3 (three) times daily.     metFORMIN (GLUCOPHAGE) 500 MG tablet Take 500 mg by mouth daily with breakfast.     metoprolol succinate (TOPROL-XL) 50 MG 24 hr tablet Take 3 tablets (150 mg total) by mouth daily. 270 tablet 3   naloxone (NARCAN) nasal spray 4 mg/0.1 mL Place 1 spray into the nose as needed for opioid reversal.     nitroGLYCERIN (NITROSTAT) 0.4 MG SL tablet Place 1 tablet (0.4 mg total) under the tongue every 5 (five) minutes as needed for chest pain. 30 tablet 3   oxyCODONE-acetaminophen (PERCOCET) 10-325 MG tablet Take 1 tablet by mouth every 4 (four) hours as needed for pain.     pantoprazole (PROTONIX) 40 MG tablet Take 40 mg by mouth daily.     simvastatin (ZOCOR) 20 MG tablet Take 20 mg by mouth at bedtime.     spironolactone (ALDACTONE) 25 MG tablet Take 1 tablet (25 mg total) by mouth daily. 90 tablet 3   valsartan (DIOVAN) 80 MG tablet Take 1 tablet (80 mg total) by mouth 2 (two) times daily. 180 tablet 3   No current facility-administered medications for this visit.    PHYSICAL EXAM Vitals:   09/04/21 1356  BP: 134/72  Pulse: 87  Resp: 20  Temp: 98 F (36.7 C)  SpO2: 94%  Weight: (!) 380 lb (172.4 kg)  Height: '6\' 2"'$  (1.88 m)    Obese gentleman in no acute distress.  He is in a wheelchair. Left lower extremity below-knee amputation Right lower extremity chronically edematous.  No palpable pedal pulses.  Likely from edema and obesity.  Brisk capillary refill.  The foot is warm and well-perfused.    PERTINENT LABORATORY  AND RADIOLOGIC DATA  Most recent CBC  Latest Ref Rng & Units 03/02/2021   11:12 AM 10/06/2020    2:28 AM 10/05/2020    1:21 AM  CBC  WBC 4.0 - 10.5 K/uL 11.0  11.1  10.6   Hemoglobin 13.0 - 17.0 g/dL 15.4  11.2  11.4   Hematocrit 39.0 - 52.0 % 46.6  38.4  38.7   Platelets 150 - 400 K/uL 246  331  280      Most recent CMP    Latest Ref Rng & Units 06/18/2021   12:05 PM 03/02/2021   11:12 AM 10/06/2020    2:28 AM  CMP  Glucose 70 - 99 mg/dL 169  146  134   BUN 8 - 27 mg/dL '14  14  17   '$ Creatinine 0.76 - 1.27 mg/dL 1.10  0.94  1.23   Sodium 134 - 144 mmol/L 139  139  135   Potassium 3.5 - 5.2 mmol/L 4.2  3.5  3.9   Chloride 96 - 106 mmol/L 95  99  93   CO2 20 - 29 mmol/L 22  31  35   Calcium 8.6 - 10.2 mg/dL 10.4  9.4  9.3     Renal function CrCl cannot be calculated (Patient's most recent lab result is older than the maximum 21 days allowed.).  Hgb A1c MFr Bld (%)  Date Value  03/08/2020 6.0 (H)    LDL Chol Calc (NIH)  Date Value Ref Range Status  12/03/2018 78 0 - 99 mg/dL Final    +-------+-----------+-----------+------------+------------+  ABI/TBIToday's ABIToday's TBIPrevious ABIPrevious TBI  +-------+-----------+-----------+------------+------------+  Right  0.95       0.31                                 +-------+-----------+-----------+------------+------------+  Left   BKA                                             +-------+-----------+-----------+------------+------------+   Yevonne Aline. Stanford Breed, MD [facility] Phone Number: [phone] 09/04/2021 5:22 PM  Total time spent on preparing this encounter including chart review, data review, collecting history, examining the patient, coordinating care for this new patient, 60 minutes.  Portions of this report may have been transcribed using voice recognition software.  Every effort has been made to ensure accuracy; however, inadvertent computerized  transcription errors may still be present.

## 2021-09-04 ENCOUNTER — Ambulatory Visit: Payer: Medicare HMO | Admitting: Vascular Surgery

## 2021-09-04 ENCOUNTER — Ambulatory Visit (HOSPITAL_COMMUNITY)
Admission: RE | Admit: 2021-09-04 | Discharge: 2021-09-04 | Disposition: A | Discharge status: Home / Self Care | Payer: Medicare HMO | Source: Ambulatory Visit | Attending: Vascular Surgery | Admitting: Vascular Surgery

## 2021-09-04 ENCOUNTER — Encounter: Payer: Self-pay | Admitting: Vascular Surgery

## 2021-09-04 VITALS — BP 134/72 | HR 87 | Temp 98.0°F | Resp 20 | Ht 74.0 in | Wt 380.0 lb

## 2021-09-04 DIAGNOSIS — I739 Peripheral vascular disease, unspecified: Secondary | ICD-10-CM

## 2021-09-05 DIAGNOSIS — M25552 Pain in left hip: Secondary | ICD-10-CM | POA: Diagnosis not present

## 2021-09-05 DIAGNOSIS — M1612 Unilateral primary osteoarthritis, left hip: Secondary | ICD-10-CM | POA: Diagnosis not present

## 2021-09-05 DIAGNOSIS — M47818 Spondylosis without myelopathy or radiculopathy, sacral and sacrococcygeal region: Secondary | ICD-10-CM | POA: Diagnosis not present

## 2021-09-05 DIAGNOSIS — G894 Chronic pain syndrome: Secondary | ICD-10-CM | POA: Diagnosis not present

## 2021-09-05 DIAGNOSIS — Z1389 Encounter for screening for other disorder: Secondary | ICD-10-CM | POA: Diagnosis not present

## 2021-09-05 DIAGNOSIS — M5136 Other intervertebral disc degeneration, lumbar region: Secondary | ICD-10-CM | POA: Diagnosis not present

## 2021-09-05 DIAGNOSIS — Z79891 Long term (current) use of opiate analgesic: Secondary | ICD-10-CM | POA: Diagnosis not present

## 2021-09-05 DIAGNOSIS — Z79899 Other long term (current) drug therapy: Secondary | ICD-10-CM | POA: Diagnosis not present

## 2021-09-05 DIAGNOSIS — M47816 Spondylosis without myelopathy or radiculopathy, lumbar region: Secondary | ICD-10-CM | POA: Diagnosis not present

## 2021-09-13 DIAGNOSIS — M5459 Other low back pain: Secondary | ICD-10-CM | POA: Diagnosis not present

## 2021-09-13 DIAGNOSIS — M25662 Stiffness of left knee, not elsewhere classified: Secondary | ICD-10-CM | POA: Diagnosis not present

## 2021-09-13 DIAGNOSIS — R269 Unspecified abnormalities of gait and mobility: Secondary | ICD-10-CM | POA: Diagnosis not present

## 2021-09-13 DIAGNOSIS — M2560 Stiffness of unspecified joint, not elsewhere classified: Secondary | ICD-10-CM | POA: Diagnosis not present

## 2021-09-13 DIAGNOSIS — M25661 Stiffness of right knee, not elsewhere classified: Secondary | ICD-10-CM | POA: Diagnosis not present

## 2021-09-13 DIAGNOSIS — M79652 Pain in left thigh: Secondary | ICD-10-CM | POA: Diagnosis not present

## 2021-09-13 DIAGNOSIS — M79651 Pain in right thigh: Secondary | ICD-10-CM | POA: Diagnosis not present

## 2021-09-13 DIAGNOSIS — M6281 Muscle weakness (generalized): Secondary | ICD-10-CM | POA: Diagnosis not present

## 2021-09-13 DIAGNOSIS — Z89512 Acquired absence of left leg below knee: Secondary | ICD-10-CM | POA: Diagnosis not present

## 2021-09-19 DIAGNOSIS — L821 Other seborrheic keratosis: Secondary | ICD-10-CM | POA: Diagnosis not present

## 2021-09-19 DIAGNOSIS — L814 Other melanin hyperpigmentation: Secondary | ICD-10-CM | POA: Diagnosis not present

## 2021-09-19 DIAGNOSIS — L853 Xerosis cutis: Secondary | ICD-10-CM | POA: Diagnosis not present

## 2021-09-19 DIAGNOSIS — L219 Seborrheic dermatitis, unspecified: Secondary | ICD-10-CM | POA: Diagnosis not present

## 2021-09-19 DIAGNOSIS — C44619 Basal cell carcinoma of skin of left upper limb, including shoulder: Secondary | ICD-10-CM | POA: Diagnosis not present

## 2021-09-25 DIAGNOSIS — C44619 Basal cell carcinoma of skin of left upper limb, including shoulder: Secondary | ICD-10-CM | POA: Diagnosis not present

## 2021-10-03 DIAGNOSIS — M25661 Stiffness of right knee, not elsewhere classified: Secondary | ICD-10-CM | POA: Diagnosis not present

## 2021-10-03 DIAGNOSIS — M79651 Pain in right thigh: Secondary | ICD-10-CM | POA: Diagnosis not present

## 2021-10-03 DIAGNOSIS — M25662 Stiffness of left knee, not elsewhere classified: Secondary | ICD-10-CM | POA: Diagnosis not present

## 2021-10-03 DIAGNOSIS — Z79891 Long term (current) use of opiate analgesic: Secondary | ICD-10-CM | POA: Diagnosis not present

## 2021-10-03 DIAGNOSIS — M5136 Other intervertebral disc degeneration, lumbar region: Secondary | ICD-10-CM | POA: Diagnosis not present

## 2021-10-03 DIAGNOSIS — R269 Unspecified abnormalities of gait and mobility: Secondary | ICD-10-CM | POA: Diagnosis not present

## 2021-10-03 DIAGNOSIS — M2560 Stiffness of unspecified joint, not elsewhere classified: Secondary | ICD-10-CM | POA: Diagnosis not present

## 2021-10-03 DIAGNOSIS — M6281 Muscle weakness (generalized): Secondary | ICD-10-CM | POA: Diagnosis not present

## 2021-10-03 DIAGNOSIS — G894 Chronic pain syndrome: Secondary | ICD-10-CM | POA: Diagnosis not present

## 2021-10-03 DIAGNOSIS — Z1389 Encounter for screening for other disorder: Secondary | ICD-10-CM | POA: Diagnosis not present

## 2021-10-03 DIAGNOSIS — M79652 Pain in left thigh: Secondary | ICD-10-CM | POA: Diagnosis not present

## 2021-10-03 DIAGNOSIS — Z79899 Other long term (current) drug therapy: Secondary | ICD-10-CM | POA: Diagnosis not present

## 2021-10-03 DIAGNOSIS — M5459 Other low back pain: Secondary | ICD-10-CM | POA: Diagnosis not present

## 2021-10-03 DIAGNOSIS — M25552 Pain in left hip: Secondary | ICD-10-CM | POA: Diagnosis not present

## 2021-10-03 DIAGNOSIS — M47816 Spondylosis without myelopathy or radiculopathy, lumbar region: Secondary | ICD-10-CM | POA: Diagnosis not present

## 2021-10-03 DIAGNOSIS — Z89512 Acquired absence of left leg below knee: Secondary | ICD-10-CM | POA: Diagnosis not present

## 2021-10-03 DIAGNOSIS — M1612 Unilateral primary osteoarthritis, left hip: Secondary | ICD-10-CM | POA: Diagnosis not present

## 2021-10-10 DIAGNOSIS — Z89512 Acquired absence of left leg below knee: Secondary | ICD-10-CM | POA: Diagnosis not present

## 2021-10-10 DIAGNOSIS — R269 Unspecified abnormalities of gait and mobility: Secondary | ICD-10-CM | POA: Diagnosis not present

## 2021-10-10 DIAGNOSIS — M79651 Pain in right thigh: Secondary | ICD-10-CM | POA: Diagnosis not present

## 2021-10-10 DIAGNOSIS — M6281 Muscle weakness (generalized): Secondary | ICD-10-CM | POA: Diagnosis not present

## 2021-10-10 DIAGNOSIS — M2560 Stiffness of unspecified joint, not elsewhere classified: Secondary | ICD-10-CM | POA: Diagnosis not present

## 2021-10-10 DIAGNOSIS — M25662 Stiffness of left knee, not elsewhere classified: Secondary | ICD-10-CM | POA: Diagnosis not present

## 2021-10-10 DIAGNOSIS — M25661 Stiffness of right knee, not elsewhere classified: Secondary | ICD-10-CM | POA: Diagnosis not present

## 2021-10-10 DIAGNOSIS — M79652 Pain in left thigh: Secondary | ICD-10-CM | POA: Diagnosis not present

## 2021-10-10 DIAGNOSIS — M5459 Other low back pain: Secondary | ICD-10-CM | POA: Diagnosis not present

## 2021-10-16 DIAGNOSIS — M5459 Other low back pain: Secondary | ICD-10-CM | POA: Diagnosis not present

## 2021-10-16 DIAGNOSIS — M79652 Pain in left thigh: Secondary | ICD-10-CM | POA: Diagnosis not present

## 2021-10-16 DIAGNOSIS — M25662 Stiffness of left knee, not elsewhere classified: Secondary | ICD-10-CM | POA: Diagnosis not present

## 2021-10-16 DIAGNOSIS — M25661 Stiffness of right knee, not elsewhere classified: Secondary | ICD-10-CM | POA: Diagnosis not present

## 2021-10-16 DIAGNOSIS — M79651 Pain in right thigh: Secondary | ICD-10-CM | POA: Diagnosis not present

## 2021-10-16 DIAGNOSIS — R269 Unspecified abnormalities of gait and mobility: Secondary | ICD-10-CM | POA: Diagnosis not present

## 2021-10-16 DIAGNOSIS — M6281 Muscle weakness (generalized): Secondary | ICD-10-CM | POA: Diagnosis not present

## 2021-10-16 DIAGNOSIS — Z89512 Acquired absence of left leg below knee: Secondary | ICD-10-CM | POA: Diagnosis not present

## 2021-10-16 DIAGNOSIS — M2560 Stiffness of unspecified joint, not elsewhere classified: Secondary | ICD-10-CM | POA: Diagnosis not present

## 2021-10-18 ENCOUNTER — Ambulatory Visit (INDEPENDENT_AMBULATORY_CARE_PROVIDER_SITE_OTHER): Payer: Medicare HMO | Admitting: *Deleted

## 2021-10-18 DIAGNOSIS — T63441D Toxic effect of venom of bees, accidental (unintentional), subsequent encounter: Secondary | ICD-10-CM | POA: Diagnosis not present

## 2021-10-23 DIAGNOSIS — S42402S Unspecified fracture of lower end of left humerus, sequela: Secondary | ICD-10-CM | POA: Diagnosis not present

## 2021-10-23 DIAGNOSIS — I10 Essential (primary) hypertension: Secondary | ICD-10-CM | POA: Diagnosis not present

## 2021-10-23 DIAGNOSIS — Z6841 Body Mass Index (BMI) 40.0 and over, adult: Secondary | ICD-10-CM | POA: Diagnosis not present

## 2021-10-23 DIAGNOSIS — E7800 Pure hypercholesterolemia, unspecified: Secondary | ICD-10-CM | POA: Diagnosis not present

## 2021-10-23 DIAGNOSIS — Z1211 Encounter for screening for malignant neoplasm of colon: Secondary | ICD-10-CM | POA: Diagnosis not present

## 2021-10-25 DIAGNOSIS — M25552 Pain in left hip: Secondary | ICD-10-CM | POA: Diagnosis not present

## 2021-10-25 DIAGNOSIS — Z79899 Other long term (current) drug therapy: Secondary | ICD-10-CM | POA: Diagnosis not present

## 2021-10-25 DIAGNOSIS — Z1389 Encounter for screening for other disorder: Secondary | ICD-10-CM | POA: Diagnosis not present

## 2021-10-25 DIAGNOSIS — G894 Chronic pain syndrome: Secondary | ICD-10-CM | POA: Diagnosis not present

## 2021-10-25 DIAGNOSIS — M1612 Unilateral primary osteoarthritis, left hip: Secondary | ICD-10-CM | POA: Diagnosis not present

## 2021-10-25 DIAGNOSIS — Z79891 Long term (current) use of opiate analgesic: Secondary | ICD-10-CM | POA: Diagnosis not present

## 2021-10-25 DIAGNOSIS — M47816 Spondylosis without myelopathy or radiculopathy, lumbar region: Secondary | ICD-10-CM | POA: Diagnosis not present

## 2021-10-25 DIAGNOSIS — M5136 Other intervertebral disc degeneration, lumbar region: Secondary | ICD-10-CM | POA: Diagnosis not present

## 2021-10-30 ENCOUNTER — Telehealth: Payer: Self-pay | Admitting: Cardiology

## 2021-10-30 ENCOUNTER — Other Ambulatory Visit: Payer: Self-pay

## 2021-10-30 MED ORDER — FUROSEMIDE 80 MG PO TABS: 80.0000 mg | ORAL_TABLET | Freq: Two times a day (BID) | ORAL | 3 refills | Status: DC

## 2021-10-30 NOTE — Telephone Encounter (Signed)
Called patient and he reported that when he lays down to sleep at night it feels like his lungs have fluid in them and he wakes up and has to sit up and starts coughing until he feels "clear". He also has edema in his hands and feet and at times he is short of breath. These symptoms started about 3 - 4 weeks ago per the patient. I reported these symptoms to Dr. Bettina Gavia and he recommended that the patient double his Lasix until and start taking it twice daily until he sees the patient on 9/28. Patient was agreeable with this plan and had no further questions at this time.

## 2021-10-30 NOTE — Telephone Encounter (Signed)
Pt c/o Shortness Of Breath: STAT if SOB developed within the last 24 hours or pt is noticeably SOB on the phone  1. Are you currently SOB (can you hear that pt is SOB on the phone)?   No, but at times  2. How long have you been experiencing SOB?    Started 3-4 weeks ago  3. Are you SOB when sitting or when up moving around?   Sometimes both, but mostly when moving around  4. Are you currently experiencing any other symptoms?  Headache  Pt c/o swelling: STAT is pt has developed SOB within 24 hours  If swelling, where is the swelling located?   Feet and hands  How much weight have you gained and in what time span? Not sure  Have you gained 3 pounds in a day or 5 pounds in a week? Not sure  Do you have a log of your daily weights (if so, list)?   No  Are you currently taking a fluid pill? Yes  Are you currently SOB?  No  Have you traveled recently?   No   Patient stated he is having fluid in his lungs, feet and hands.  Patient has appointment scheduled tomorrow (9/27).

## 2021-10-31 ENCOUNTER — Ambulatory Visit: Payer: Medicare HMO | Admitting: Nurse Practitioner

## 2021-11-01 ENCOUNTER — Encounter: Payer: Self-pay | Admitting: Cardiology

## 2021-11-01 ENCOUNTER — Ambulatory Visit: Payer: Medicare HMO | Attending: Cardiology | Admitting: Cardiology

## 2021-11-01 VITALS — BP 103/60 | Ht 74.0 in | Wt 396.0 lb

## 2021-11-01 DIAGNOSIS — I509 Heart failure, unspecified: Secondary | ICD-10-CM | POA: Diagnosis not present

## 2021-11-01 DIAGNOSIS — I4819 Other persistent atrial fibrillation: Secondary | ICD-10-CM | POA: Diagnosis not present

## 2021-11-01 DIAGNOSIS — I119 Hypertensive heart disease without heart failure: Secondary | ICD-10-CM

## 2021-11-01 DIAGNOSIS — I5043 Acute on chronic combined systolic (congestive) and diastolic (congestive) heart failure: Secondary | ICD-10-CM

## 2021-11-01 DIAGNOSIS — Z7901 Long term (current) use of anticoagulants: Secondary | ICD-10-CM | POA: Diagnosis not present

## 2021-11-01 MED ORDER — TORSEMIDE 100 MG PO TABS: 50.0000 mg | ORAL_TABLET | Freq: Two times a day (BID) | ORAL | 3 refills | Status: DC

## 2021-11-01 MED ORDER — METOLAZONE 5 MG PO TABS: 5.0000 mg | ORAL_TABLET | Freq: Two times a day (BID) | ORAL | 0 refills | Status: DC

## 2021-11-01 NOTE — Patient Instructions (Signed)
Medication Instructions:  Your physician has recommended you make the following change in your medication:   START: Torsemide 50 mg twice daily START: Metolazone 5 mg take a half hour prior to Torsemide (Take today and tomorrow) STOP: Furosemide  *If you need a refill on your cardiac medications before your next appointment, please call your pharmacy*   Lab Work: Your physician recommends that you return for lab work in:   Labs today: BMP, Pro BNP  If you have labs (blood work) drawn today and your tests are completely normal, you will receive your results only by: MyChart Message (if you have MyChart) OR A paper copy in the mail If you have any lab test that is abnormal or we need to change your treatment, we will call you to review the results.   Testing/Procedures: None   Follow-Up: At [facility], you and your health needs are our priority.  As part of our continuing mission to provide you with exceptional heart care, we have created designated Provider Care Teams.  These Care Teams include your primary Cardiologist (physician) and Advanced Practice Providers (APPs -  Physician Assistants and Nurse Practitioners) who all work together to provide you with the care you need, when you need it.  We recommend signing up for the patient portal called "MyChart".  Sign up information is provided on this After Visit Summary.  MyChart is used to connect with patients for Virtual Visits (Telemedicine).  Patients are able to view lab/test results, encounter notes, upcoming appointments, etc.  Non-urgent messages can be sent to your provider as well.   To learn more about what you can do with MyChart, go to [email or web address].    Your next appointment:   2 week(s)  The format for your next appointment:   In Person  Provider:   Shirlee More, MD    Other Instructions None  Important Information About Sugar

## 2021-11-01 NOTE — Progress Notes (Signed)
Cardiology Office Note:    Date:  11/01/2021   ID:  David Fitzgerald, DOB [birth year], MRN [id number]  PCP:  Angelina Sheriff, MD  Cardiologist:  Shirlee More, MD    Referring MD: Angelina Sheriff, MD    ASSESSMENT:    1. Acute on chronic combined systolic and diastolic congestive heart failure ([place])   2. Hypertensive heart disease without heart failure   3. Persistent atrial fibrillation ([place])   4. Chronic anticoagulation    PLAN:    In order of problems listed above:  He has decompensated heart failure marked fluid overload weight is likely up in the range of 20 pounds we will switch furosemide to torsemide and Zaroxolyn if he does not respond in 48 hours he will require admission to the hospital and diuretic.  I am on-call this weekend and I asked his mother to call me if he is not better Saturday in the interim continue his spironolactone ARB and beta-blocker.  He will be seen back in the office less than 2 weeks and this would be a good opportunity to optimize heart failure treatment transition from valsartan to Entresto in addition of SGLT2 inhibitor Rate controlled atrial fibrillation continue his beta-blocker and current anticoagulant   Next appointment: 1 to 2 weeks   Medication Adjustments/Labs and Tests Ordered: Current medicines are reviewed at length with the patient today.  Concerns regarding medicines are outlined above.  Orders Placed This Encounter  Procedures   Basic Metabolic Panel (BMET)   Pro b natriuretic peptide   Meds ordered this encounter  Medications   torsemide (DEMADEX) 100 MG tablet    Sig: Take 0.5 tablets (50 mg total) by mouth 2 (two) times daily.    Dispense:  90 tablet    Refill:  3   metolazone (ZAROXOLYN) 5 MG tablet    Sig: Take 1 tablet (5 mg total) by mouth 2 (two) times daily. Please take 1 tablet (5 mg) a half hour prior to taking Torsemide for today and tommorrow    Dispense:  5 tablet    Refill:  0    Chief Complaint   Patient presents with   Follow-up   Congestive Heart Failure    History of Present Illness:    David Fitzgerald is a 61 y.o. male with a hx of permanent atrial fibrillation with chronic anticoagulation hypertensive heart disease with heart failure mild LV dysfunction with an ejection fraction 40 to 45% moderate concentric LVH on echocardiogram 08/18/2020 last seen 06/18/2021.  He is worked in the office hours calling in complaining of shortness of breath and edema and diuretic was increased as an outpatient  Compliance with diet, lifestyle and medications: Yes  His mother is present participates in evaluation decision making Slowly his weight is up 15 to 20 pounds he has increasing edema and he is having orthopnea and few episodes he has to sit up at nighttime We increased his diuretic and no apparent response he cannot weigh daily at home He require admission to the hospital organ add Zaroxolyn changed from furosemide to torsemide if he does not respond in 48 hours he will need hospitalization IV diuretic  Repeat echocardiogram 07/31/2021 shows EF of 40 to 45% with contrast delineation Past Medical History:  Diagnosis Date   A-fib [facility])    Abscess of ankle    Abscess of bursa of left ankle 10/04/2020   Allergy to pollen 04/06/2015   Ankle fracture 01/31/2020   Anxiety 02/06/2017  Arthritis 02/06/2017   Asthma    as a child   Atrial fibrillation with rapid ventricular response ([place]) 12/08/2020   Bee sting allergy    wasp / mixed vespid   Bell's palsy 02/24/2013   BMI 45.0-49.9, adult ([place]) 07/09/2012   CAD in native artery 02/07/2017   CHF (congestive heart failure) (HCC)    Chronic anticoagulation 03/07/2017   Chronic pain syndrome 07/09/2012   Chronic venous insufficiency 12/14/2014   Chronic, continuous use of opioids 02/15/2020   Coronary artery calcification seen on CT scan 02/07/2017   Depression    Diabetes ([place])    Dyspnea on minimal exertion 12/08/2020    Dysrhythmia    afib   Edema 12/14/2014   Epigastric pain 12/08/2020   GERD (gastroesophageal reflux disease)    Hematuria 12/08/2020   History of kidney stones    Hyperlipidemia, mixed 12/14/2014   Hypertension    Hypertensive heart disease 12/14/2014   Hypotension 12/25/2017   IBS (irritable bowel syndrome)    Instability of left elbow joint 02/22/2021   Left ankle pain 04/05/2020   Lumbar pain with radiation down left leg 07/09/2012   Morbid obesity ([place]) 12/14/2014   Morbid obesity with BMI of 40.0-44.9, adult ([place]) 12/14/2014   Morbid obesity with BMI of 45.0-49.9, adult ([place]) 07/09/2012   Nausea & vomiting 12/08/2020   Obesity hypoventilation syndrome ([place]) 12/14/2014   Open dislocation of left ankle    Open fracture dislocation of left ankle 01/31/2020   OSA (obstructive sleep apnea) 03/09/2020   doesn't use a Cpap   OSA treated with BiPAP 12/14/2014   Pain syndrome, chronic 07/09/2012   Permanent atrial fibrillation (HCC)    Pernicious anemia 02/06/2017   Persistent atrial fibrillation ([place]) 02/06/2017   CHADS2vasc of 2   Pneumonia    Polyneuropathy in diabetes ([place]) 02/24/2013   Subacute osteomyelitis, left ankle and foot ([place])    Syndrome affecting cervical region 02/24/2013   Trigeminal neuralgia 02/24/2013   Type 2 diabetes mellitus with diabetic neuropathy ([place]) 12/14/2014   Type II diabetes mellitus, uncontrolled 12/08/2020   Vitamin D deficiency 02/16/2020   Wound dehiscence, traumatic injury repair, left medial ankle  03/08/2020    Past Surgical History:  Procedure Laterality Date    INTERNAL FIXATION LEFT TIBIA TO CALCANEUS AND APPLY SKIN GRAFT (Left Ankle)  04/05/2020   ADENOIDECTOMY     AMPUTATION Left 10/04/2020   Procedure: LEFT BELOW KNEE AMPUTATION;  Surgeon: [name], MD;  Location: [facility];  Service: Orthopedics;  Laterality: Left;   BACK SURGERY  2002 /2003   CARPAL TUNNEL RELEASE Bilateral    ELBOW LIGAMENT RECONSTRUCTION Left 03/02/2021    Procedure: LEFT ELBOW LATERAL ULNAR COLLATERAL LIGAMENT REPAIR/ POSSIBLE MEDIAL LIGAMENT REPAIR;  Surgeon: Sherilyn Cooter, MD;  Location: [place] [facility];  Service: Orthopedics;  Laterality: Left;   I & D EXTREMITY Left 01/31/2020   Procedure: IRRIGATION AND DEBRIDEMENT ANKLE;  Surgeon: Altamese Palmer, MD;  Location: [facility];  Service: Orthopedics;  Laterality: Left;   I & D EXTREMITY Left 03/10/2020   Procedure: DEBRIDEMENT LEFT ANKLE, APPLY SKIN GRAFT;  Surgeon: [name], MD;  Location: [facility];  Service: Orthopedics;  Laterality: Left;   KNEE SURGERY Left 2005   ORIF ANKLE FRACTURE Left 01/31/2020   ORIF ANKLE FRACTURE Left 01/31/2020   Procedure: OPEN REDUCTION INTERNAL FIXATION (ORIF) ANKLE FRACTURE;  Surgeon: Altamese Speedway, MD;  Location: [facility];  Service: Orthopedics;  Laterality: Left;   ORIF ANKLE FRACTURE Left 04/05/2020  Procedure: INTERNAL FIXATION LEFT TIBIA TO CALCANEUS AND APPLY SKIN GRAFT;  Surgeon: [name], MD;  Location: [place];  Service: Orthopedics;  Laterality: Left;   SHOULDER SURGERY Left 1998   TONSILLECTOMY      Current Medications: Current Meds  Medication Sig   apixaban (ELIQUIS) 5 MG TABS tablet Take 1 tablet (5 mg total) by mouth 2 (two) times daily.   ascorbic acid (VITAMIN C) 1000 MG tablet Take 1,000 mg by mouth daily.   Cholecalciferol (VITAMIN D3) 125 MCG (5000 UT) TABS Take 5,000 Units by mouth daily.   Cyanocobalamin (B-12) 2500 MCG TABS Take 2,500 mcg by mouth daily.   diazepam (VALIUM) 5 MG tablet Take 5 mg by mouth every 6 (six) hours as needed for anxiety.   DULoxetine (CYMBALTA) 30 MG capsule Take 30 mg by mouth at bedtime.   DULoxetine (CYMBALTA) 60 MG capsule Take 60 mg by mouth every morning.   EPINEPHrine 0.3 mg/0.3 mL IJ SOAJ injection Inject 0.3 mg into the muscle as needed for anaphylaxis.   gabapentin (NEURONTIN) 600 MG tablet Take 600 mg by mouth 3 (three) times daily.   metFORMIN (GLUCOPHAGE) 500 MG tablet Take 500 mg by mouth daily with  breakfast.   metolazone (ZAROXOLYN) 5 MG tablet Take 1 tablet (5 mg total) by mouth 2 (two) times daily. Please take 1 tablet (5 mg) a half hour prior to taking Torsemide for today and tommorrow   metoprolol succinate (TOPROL-XL) 50 MG 24 hr tablet Take 3 tablets (150 mg total) by mouth daily.   naloxone (NARCAN) nasal spray 4 mg/0.1 mL Place 1 spray into the nose as needed for opioid reversal.   nitroGLYCERIN (NITROSTAT) 0.4 MG SL tablet Place 1 tablet (0.4 mg total) under the tongue every 5 (five) minutes as needed for chest pain.   oxyCODONE-acetaminophen (PERCOCET) 10-325 MG tablet Take 1 tablet by mouth every 4 (four) hours as needed for pain.   pantoprazole (PROTONIX) 40 MG tablet Take 40 mg by mouth daily.   simvastatin (ZOCOR) 20 MG tablet Take 20 mg by mouth at bedtime.   spironolactone (ALDACTONE) 25 MG tablet Take 1 tablet (25 mg total) by mouth daily.   torsemide (DEMADEX) 100 MG tablet Take 0.5 tablets (50 mg total) by mouth 2 (two) times daily.   valsartan (DIOVAN) 80 MG tablet Take 1 tablet (80 mg total) by mouth 2 (two) times daily.   [DISCONTINUED] furosemide (LASIX) 80 MG tablet Take 1 tablet (80 mg total) by mouth 2 (two) times daily.     Allergies:   Bee venom, Penicillins, and Aspirin   Social History   Socioeconomic History   Marital status: Married    Spouse name: Not on file   Number of children: Not on file   Years of education: Not on file   Highest education level: Not on file  Occupational History   Not on file  Tobacco Use   Smoking status: Former    Packs/day: 3.00    Years: 35.00    Total pack years: 105.00    Types: Cigarettes    Quit date: 04/13/2006    Years since quitting: 15.5   Smokeless tobacco: Never  Vaping Use   Vaping Use: Never used  Substance and Sexual Activity   Alcohol use: No    Alcohol/week: 0.0 standard drinks of alcohol   Drug use: No   Sexual activity: Not on file  Other Topics Concern   Not on file  Social History  Narrative  Not on file   Social Determinants of Health   Financial Resource Strain: Not on file  Food Insecurity: Not on file  Transportation Needs: No Transportation Needs (04/13/2020)   PRAPARE - Hydrologist (Medical): No    Lack of Transportation (Non-Medical): No  Physical Activity: Not on file  Stress: Not on file  Social Connections: Not on file     Family History: The patient's family history includes Atrial fibrillation in his father; Breast cancer in his maternal grandmother and mother; CAD in his father; Heart disease in his father and paternal uncle; Stomach cancer in his paternal aunt. ROS:   Please see the history of present illness.    All other systems reviewed and are negative.  EKGs/Labs/Other Studies Reviewed:    The following studies were reviewed today:   Recent Labs: 03/02/2021: Hemoglobin 15.4; Platelets 246 06/18/2021: BUN 14; Creatinine, Ser 1.10; NT-Pro BNP 280; Potassium 4.2; Sodium 139  Recent Lipid Panel    Component Value Date/Time   CHOL 156 12/03/2018 1502   TRIG 301 (H) 12/03/2018 1502   HDL 29 (L) 12/03/2018 1502   CHOLHDL 5.4 (H) 12/03/2018 1502   LDLCALC 78 12/03/2018 1502    Physical Exam:    VS:  BP 103/60 (BP Location: Right Arm, Patient Position: Sitting, Cuff Size: Normal)   Ht '6\' 2"'$  (1.88 m)   Wt (!) 396 lb (179.6 kg)   SpO2 90%   BMI 50.84 kg/m     Wt Readings from Last 3 Encounters:  11/01/21 (!) 396 lb (179.6 kg)  09/04/21 (!) 380 lb (172.4 kg)  06/18/21 (!) 378 lb (171.5 kg)     GEN:  Well nourished, well developed in no acute distress HEENT: Normal NECK: No JVD; No carotid bruits LYMPHATICS: No lymphadenopathy CARDIAC: RRR, no murmurs, rubs, gallops RESPIRATORY:  Clear to auscultation without rales, wheezing or rhonchi  ABDOMEN: Soft, non-tender, non-distended MUSCULOSKELETAL:  No edema; No deformity  SKIN: Warm and dry NEUROLOGIC:  Alert and oriented x 3 PSYCHIATRIC:  Normal  affect    Signed, Shirlee More, MD  11/01/2021 3:01 PM    [facility]

## 2021-11-02 LAB — BASIC METABOLIC PANEL
BUN/Creatinine Ratio: 13 (ref 10–24)
BUN: 21 mg/dL (ref 8–27)
CO2: 28 mmol/L (ref 20–29)
Calcium: 9.4 mg/dL (ref 8.6–10.2)
Chloride: 90 mmol/L — ABNORMAL LOW (ref 96–106)
Creatinine, Ser: 1.57 mg/dL — ABNORMAL HIGH (ref 0.76–1.27)
Glucose: 125 mg/dL — ABNORMAL HIGH (ref 70–99)
Potassium: 4.8 mmol/L (ref 3.5–5.2)
Sodium: 139 mmol/L (ref 134–144)
eGFR: 50 mL/min/{1.73_m2} — ABNORMAL LOW (ref >59)

## 2021-11-02 LAB — PRO B NATRIURETIC PEPTIDE: NT-Pro BNP: 247 pg/mL — ABNORMAL HIGH (ref 0–210)

## 2021-11-03 DIAGNOSIS — I480 Paroxysmal atrial fibrillation: Secondary | ICD-10-CM | POA: Diagnosis not present

## 2021-11-03 DIAGNOSIS — E7800 Pure hypercholesterolemia, unspecified: Secondary | ICD-10-CM | POA: Diagnosis not present

## 2021-11-03 DIAGNOSIS — I10 Essential (primary) hypertension: Secondary | ICD-10-CM | POA: Diagnosis not present

## 2021-11-11 DIAGNOSIS — K921 Melena: Secondary | ICD-10-CM | POA: Diagnosis not present

## 2021-11-11 DIAGNOSIS — G319 Degenerative disease of nervous system, unspecified: Secondary | ICD-10-CM | POA: Diagnosis not present

## 2021-11-11 DIAGNOSIS — W19XXXA Unspecified fall, initial encounter: Secondary | ICD-10-CM | POA: Diagnosis not present

## 2021-11-11 DIAGNOSIS — G9341 Metabolic encephalopathy: Secondary | ICD-10-CM | POA: Diagnosis not present

## 2021-11-11 DIAGNOSIS — R0600 Dyspnea, unspecified: Secondary | ICD-10-CM | POA: Diagnosis not present

## 2021-11-11 DIAGNOSIS — J439 Emphysema, unspecified: Secondary | ICD-10-CM | POA: Diagnosis not present

## 2021-11-11 DIAGNOSIS — R404 Transient alteration of awareness: Secondary | ICD-10-CM | POA: Diagnosis not present

## 2021-11-11 DIAGNOSIS — R4182 Altered mental status, unspecified: Secondary | ICD-10-CM | POA: Diagnosis not present

## 2021-11-11 DIAGNOSIS — E875 Hyperkalemia: Secondary | ICD-10-CM | POA: Diagnosis not present

## 2021-11-11 DIAGNOSIS — R279 Unspecified lack of coordination: Secondary | ICD-10-CM | POA: Diagnosis not present

## 2021-11-11 DIAGNOSIS — I480 Paroxysmal atrial fibrillation: Secondary | ICD-10-CM | POA: Diagnosis not present

## 2021-11-11 DIAGNOSIS — R109 Unspecified abdominal pain: Secondary | ICD-10-CM | POA: Diagnosis not present

## 2021-11-11 DIAGNOSIS — I10 Essential (primary) hypertension: Secondary | ICD-10-CM | POA: Diagnosis not present

## 2021-11-11 DIAGNOSIS — Z23 Encounter for immunization: Secondary | ICD-10-CM | POA: Diagnosis not present

## 2021-11-11 DIAGNOSIS — E8779 Other fluid overload: Secondary | ICD-10-CM | POA: Diagnosis not present

## 2021-11-11 DIAGNOSIS — R000 Tachycardia, unspecified: Secondary | ICD-10-CM | POA: Diagnosis not present

## 2021-11-11 DIAGNOSIS — J189 Pneumonia, unspecified organism: Secondary | ICD-10-CM | POA: Diagnosis not present

## 2021-11-11 DIAGNOSIS — J449 Chronic obstructive pulmonary disease, unspecified: Secondary | ICD-10-CM | POA: Diagnosis not present

## 2021-11-11 DIAGNOSIS — E871 Hypo-osmolality and hyponatremia: Secondary | ICD-10-CM | POA: Diagnosis not present

## 2021-11-11 DIAGNOSIS — R001 Bradycardia, unspecified: Secondary | ICD-10-CM | POA: Diagnosis not present

## 2021-11-11 DIAGNOSIS — F339 Major depressive disorder, recurrent, unspecified: Secondary | ICD-10-CM | POA: Diagnosis not present

## 2021-11-11 DIAGNOSIS — R41841 Cognitive communication deficit: Secondary | ICD-10-CM | POA: Diagnosis not present

## 2021-11-11 DIAGNOSIS — N19 Unspecified kidney failure: Secondary | ICD-10-CM | POA: Diagnosis not present

## 2021-11-11 DIAGNOSIS — M6281 Muscle weakness (generalized): Secondary | ICD-10-CM | POA: Diagnosis not present

## 2021-11-11 DIAGNOSIS — R079 Chest pain, unspecified: Secondary | ICD-10-CM | POA: Diagnosis not present

## 2021-11-11 DIAGNOSIS — Z89512 Acquired absence of left leg below knee: Secondary | ICD-10-CM | POA: Diagnosis not present

## 2021-11-11 DIAGNOSIS — Z043 Encounter for examination and observation following other accident: Secondary | ICD-10-CM | POA: Diagnosis not present

## 2021-11-11 DIAGNOSIS — I9589 Other hypotension: Secondary | ICD-10-CM | POA: Diagnosis not present

## 2021-11-11 DIAGNOSIS — M79605 Pain in left leg: Secondary | ICD-10-CM | POA: Diagnosis not present

## 2021-11-11 DIAGNOSIS — Z4781 Encounter for orthopedic aftercare following surgical amputation: Secondary | ICD-10-CM | POA: Diagnosis not present

## 2021-11-11 DIAGNOSIS — A0472 Enterocolitis due to Clostridium difficile, not specified as recurrent: Secondary | ICD-10-CM | POA: Diagnosis not present

## 2021-11-11 DIAGNOSIS — A419 Sepsis, unspecified organism: Secondary | ICD-10-CM | POA: Diagnosis not present

## 2021-11-11 DIAGNOSIS — R2681 Unsteadiness on feet: Secondary | ICD-10-CM | POA: Diagnosis not present

## 2021-11-11 DIAGNOSIS — E1169 Type 2 diabetes mellitus with other specified complication: Secondary | ICD-10-CM | POA: Diagnosis not present

## 2021-11-11 DIAGNOSIS — I4891 Unspecified atrial fibrillation: Secondary | ICD-10-CM | POA: Diagnosis not present

## 2021-11-11 DIAGNOSIS — R9082 White matter disease, unspecified: Secondary | ICD-10-CM | POA: Diagnosis not present

## 2021-11-11 DIAGNOSIS — R195 Other fecal abnormalities: Secondary | ICD-10-CM | POA: Diagnosis not present

## 2021-11-11 DIAGNOSIS — E8729 Other acidosis: Secondary | ICD-10-CM | POA: Diagnosis not present

## 2021-11-11 DIAGNOSIS — Z7401 Bed confinement status: Secondary | ICD-10-CM | POA: Diagnosis not present

## 2021-11-11 DIAGNOSIS — R0902 Hypoxemia: Secondary | ICD-10-CM | POA: Diagnosis not present

## 2021-11-11 DIAGNOSIS — N179 Acute kidney failure, unspecified: Secondary | ICD-10-CM | POA: Diagnosis not present

## 2021-11-11 DIAGNOSIS — R5381 Other malaise: Secondary | ICD-10-CM | POA: Diagnosis not present

## 2021-11-11 DIAGNOSIS — I482 Chronic atrial fibrillation, unspecified: Secondary | ICD-10-CM | POA: Diagnosis not present

## 2021-11-11 DIAGNOSIS — R410 Disorientation, unspecified: Secondary | ICD-10-CM | POA: Diagnosis not present

## 2021-11-11 DIAGNOSIS — R102 Pelvic and perineal pain: Secondary | ICD-10-CM | POA: Diagnosis not present

## 2021-11-11 DIAGNOSIS — J9610 Chronic respiratory failure, unspecified whether with hypoxia or hypercapnia: Secondary | ICD-10-CM | POA: Diagnosis not present

## 2021-11-11 DIAGNOSIS — E119 Type 2 diabetes mellitus without complications: Secondary | ICD-10-CM | POA: Diagnosis not present

## 2021-11-11 DIAGNOSIS — R918 Other nonspecific abnormal finding of lung field: Secondary | ICD-10-CM | POA: Diagnosis not present

## 2021-11-11 DIAGNOSIS — Z452 Encounter for adjustment and management of vascular access device: Secondary | ICD-10-CM | POA: Diagnosis not present

## 2021-11-11 DIAGNOSIS — Z9981 Dependence on supplemental oxygen: Secondary | ICD-10-CM | POA: Diagnosis not present

## 2021-11-11 DIAGNOSIS — M25552 Pain in left hip: Secondary | ICD-10-CM | POA: Diagnosis not present

## 2021-11-11 DIAGNOSIS — G4733 Obstructive sleep apnea (adult) (pediatric): Secondary | ICD-10-CM | POA: Diagnosis not present

## 2021-11-11 DIAGNOSIS — J329 Chronic sinusitis, unspecified: Secondary | ICD-10-CM | POA: Diagnosis not present

## 2021-11-12 DIAGNOSIS — G4733 Obstructive sleep apnea (adult) (pediatric): Secondary | ICD-10-CM | POA: Diagnosis not present

## 2021-11-12 DIAGNOSIS — I10 Essential (primary) hypertension: Secondary | ICD-10-CM

## 2021-11-12 DIAGNOSIS — I480 Paroxysmal atrial fibrillation: Secondary | ICD-10-CM | POA: Diagnosis not present

## 2021-11-12 DIAGNOSIS — E1169 Type 2 diabetes mellitus with other specified complication: Secondary | ICD-10-CM

## 2021-11-12 DIAGNOSIS — R4182 Altered mental status, unspecified: Secondary | ICD-10-CM | POA: Diagnosis not present

## 2021-11-12 DIAGNOSIS — N179 Acute kidney failure, unspecified: Secondary | ICD-10-CM | POA: Diagnosis not present

## 2021-11-12 DIAGNOSIS — J329 Chronic sinusitis, unspecified: Secondary | ICD-10-CM | POA: Diagnosis not present

## 2021-11-13 DIAGNOSIS — G4733 Obstructive sleep apnea (adult) (pediatric): Secondary | ICD-10-CM | POA: Diagnosis not present

## 2021-11-13 DIAGNOSIS — N179 Acute kidney failure, unspecified: Secondary | ICD-10-CM | POA: Diagnosis not present

## 2021-11-13 DIAGNOSIS — I480 Paroxysmal atrial fibrillation: Secondary | ICD-10-CM | POA: Diagnosis not present

## 2021-11-14 DIAGNOSIS — G4733 Obstructive sleep apnea (adult) (pediatric): Secondary | ICD-10-CM | POA: Diagnosis not present

## 2021-11-14 DIAGNOSIS — G319 Degenerative disease of nervous system, unspecified: Secondary | ICD-10-CM | POA: Diagnosis not present

## 2021-11-14 DIAGNOSIS — I480 Paroxysmal atrial fibrillation: Secondary | ICD-10-CM | POA: Diagnosis not present

## 2021-11-14 DIAGNOSIS — N179 Acute kidney failure, unspecified: Secondary | ICD-10-CM | POA: Diagnosis not present

## 2021-11-14 DIAGNOSIS — R102 Pelvic and perineal pain: Secondary | ICD-10-CM | POA: Diagnosis not present

## 2021-11-14 DIAGNOSIS — Z043 Encounter for examination and observation following other accident: Secondary | ICD-10-CM | POA: Diagnosis not present

## 2021-11-15 ENCOUNTER — Ambulatory Visit: Payer: Medicare HMO | Admitting: Cardiology

## 2021-11-15 DIAGNOSIS — N179 Acute kidney failure, unspecified: Secondary | ICD-10-CM | POA: Diagnosis not present

## 2021-11-15 DIAGNOSIS — I480 Paroxysmal atrial fibrillation: Secondary | ICD-10-CM | POA: Diagnosis not present

## 2021-11-15 DIAGNOSIS — G4733 Obstructive sleep apnea (adult) (pediatric): Secondary | ICD-10-CM | POA: Diagnosis not present

## 2021-11-16 DIAGNOSIS — G4733 Obstructive sleep apnea (adult) (pediatric): Secondary | ICD-10-CM | POA: Diagnosis not present

## 2021-11-16 DIAGNOSIS — N179 Acute kidney failure, unspecified: Secondary | ICD-10-CM | POA: Diagnosis not present

## 2021-11-16 DIAGNOSIS — R0902 Hypoxemia: Secondary | ICD-10-CM | POA: Diagnosis not present

## 2021-11-16 DIAGNOSIS — I480 Paroxysmal atrial fibrillation: Secondary | ICD-10-CM | POA: Diagnosis not present

## 2021-11-21 DIAGNOSIS — E119 Type 2 diabetes mellitus without complications: Secondary | ICD-10-CM | POA: Diagnosis not present

## 2021-11-21 DIAGNOSIS — R339 Retention of urine, unspecified: Secondary | ICD-10-CM | POA: Diagnosis not present

## 2021-11-21 DIAGNOSIS — E8729 Other acidosis: Secondary | ICD-10-CM | POA: Diagnosis not present

## 2021-11-21 DIAGNOSIS — A0472 Enterocolitis due to Clostridium difficile, not specified as recurrent: Secondary | ICD-10-CM | POA: Diagnosis not present

## 2021-11-21 DIAGNOSIS — Z6841 Body Mass Index (BMI) 40.0 and over, adult: Secondary | ICD-10-CM | POA: Diagnosis not present

## 2021-11-21 DIAGNOSIS — R945 Abnormal results of liver function studies: Secondary | ICD-10-CM | POA: Diagnosis not present

## 2021-11-21 DIAGNOSIS — E1169 Type 2 diabetes mellitus with other specified complication: Secondary | ICD-10-CM | POA: Diagnosis not present

## 2021-11-21 DIAGNOSIS — R2681 Unsteadiness on feet: Secondary | ICD-10-CM | POA: Diagnosis not present

## 2021-11-21 DIAGNOSIS — Z89512 Acquired absence of left leg below knee: Secondary | ICD-10-CM | POA: Diagnosis not present

## 2021-11-21 DIAGNOSIS — I2510 Atherosclerotic heart disease of native coronary artery without angina pectoris: Secondary | ICD-10-CM | POA: Diagnosis not present

## 2021-11-21 DIAGNOSIS — M6281 Muscle weakness (generalized): Secondary | ICD-10-CM | POA: Diagnosis not present

## 2021-11-21 DIAGNOSIS — Z7401 Bed confinement status: Secondary | ICD-10-CM | POA: Diagnosis not present

## 2021-11-21 DIAGNOSIS — E785 Hyperlipidemia, unspecified: Secondary | ICD-10-CM | POA: Diagnosis not present

## 2021-11-21 DIAGNOSIS — G9341 Metabolic encephalopathy: Secondary | ICD-10-CM | POA: Diagnosis not present

## 2021-11-21 DIAGNOSIS — J189 Pneumonia, unspecified organism: Secondary | ICD-10-CM | POA: Diagnosis not present

## 2021-11-21 DIAGNOSIS — Z23 Encounter for immunization: Secondary | ICD-10-CM | POA: Diagnosis not present

## 2021-11-21 DIAGNOSIS — I509 Heart failure, unspecified: Secondary | ICD-10-CM | POA: Diagnosis not present

## 2021-11-21 DIAGNOSIS — Z4781 Encounter for orthopedic aftercare following surgical amputation: Secondary | ICD-10-CM | POA: Diagnosis not present

## 2021-11-21 DIAGNOSIS — R5383 Other fatigue: Secondary | ICD-10-CM | POA: Diagnosis not present

## 2021-11-21 DIAGNOSIS — R400 Somnolence: Secondary | ICD-10-CM | POA: Diagnosis not present

## 2021-11-21 DIAGNOSIS — R309 Painful micturition, unspecified: Secondary | ICD-10-CM | POA: Diagnosis not present

## 2021-11-21 DIAGNOSIS — A419 Sepsis, unspecified organism: Secondary | ICD-10-CM | POA: Diagnosis not present

## 2021-11-21 DIAGNOSIS — R110 Nausea: Secondary | ICD-10-CM | POA: Diagnosis not present

## 2021-11-21 DIAGNOSIS — K921 Melena: Secondary | ICD-10-CM | POA: Diagnosis not present

## 2021-11-21 DIAGNOSIS — R0902 Hypoxemia: Secondary | ICD-10-CM | POA: Diagnosis not present

## 2021-11-21 DIAGNOSIS — R140 Abdominal distension (gaseous): Secondary | ICD-10-CM | POA: Diagnosis not present

## 2021-11-21 DIAGNOSIS — I4891 Unspecified atrial fibrillation: Secondary | ICD-10-CM | POA: Diagnosis not present

## 2021-11-21 DIAGNOSIS — R338 Other retention of urine: Secondary | ICD-10-CM | POA: Diagnosis not present

## 2021-11-21 DIAGNOSIS — G4733 Obstructive sleep apnea (adult) (pediatric): Secondary | ICD-10-CM | POA: Diagnosis not present

## 2021-11-21 DIAGNOSIS — J449 Chronic obstructive pulmonary disease, unspecified: Secondary | ICD-10-CM | POA: Diagnosis not present

## 2021-11-21 DIAGNOSIS — R5381 Other malaise: Secondary | ICD-10-CM | POA: Diagnosis not present

## 2021-11-21 DIAGNOSIS — E871 Hypo-osmolality and hyponatremia: Secondary | ICD-10-CM | POA: Diagnosis not present

## 2021-11-21 DIAGNOSIS — L97419 Non-pressure chronic ulcer of right heel and midfoot with unspecified severity: Secondary | ICD-10-CM | POA: Diagnosis not present

## 2021-11-21 DIAGNOSIS — R262 Difficulty in walking, not elsewhere classified: Secondary | ICD-10-CM | POA: Diagnosis not present

## 2021-11-21 DIAGNOSIS — G894 Chronic pain syndrome: Secondary | ICD-10-CM | POA: Diagnosis not present

## 2021-11-21 DIAGNOSIS — I480 Paroxysmal atrial fibrillation: Secondary | ICD-10-CM | POA: Diagnosis not present

## 2021-11-21 DIAGNOSIS — F339 Major depressive disorder, recurrent, unspecified: Secondary | ICD-10-CM | POA: Diagnosis not present

## 2021-11-21 DIAGNOSIS — R279 Unspecified lack of coordination: Secondary | ICD-10-CM | POA: Diagnosis not present

## 2021-11-21 DIAGNOSIS — R41841 Cognitive communication deficit: Secondary | ICD-10-CM | POA: Diagnosis not present

## 2021-11-21 DIAGNOSIS — J4520 Mild intermittent asthma, uncomplicated: Secondary | ICD-10-CM | POA: Diagnosis not present

## 2021-11-21 DIAGNOSIS — Z978 Presence of other specified devices: Secondary | ICD-10-CM | POA: Diagnosis not present

## 2021-11-21 DIAGNOSIS — E1159 Type 2 diabetes mellitus with other circulatory complications: Secondary | ICD-10-CM | POA: Diagnosis not present

## 2021-11-21 DIAGNOSIS — N179 Acute kidney failure, unspecified: Secondary | ICD-10-CM | POA: Diagnosis not present

## 2021-11-21 DIAGNOSIS — R7989 Other specified abnormal findings of blood chemistry: Secondary | ICD-10-CM | POA: Diagnosis not present

## 2021-11-22 DIAGNOSIS — Z6841 Body Mass Index (BMI) 40.0 and over, adult: Secondary | ICD-10-CM | POA: Diagnosis not present

## 2021-11-22 DIAGNOSIS — R262 Difficulty in walking, not elsewhere classified: Secondary | ICD-10-CM | POA: Diagnosis not present

## 2021-11-22 DIAGNOSIS — M6281 Muscle weakness (generalized): Secondary | ICD-10-CM | POA: Diagnosis not present

## 2021-11-22 DIAGNOSIS — E785 Hyperlipidemia, unspecified: Secondary | ICD-10-CM | POA: Diagnosis not present

## 2021-11-22 DIAGNOSIS — E1169 Type 2 diabetes mellitus with other specified complication: Secondary | ICD-10-CM | POA: Diagnosis not present

## 2021-11-22 DIAGNOSIS — I509 Heart failure, unspecified: Secondary | ICD-10-CM | POA: Diagnosis not present

## 2021-11-22 DIAGNOSIS — L97419 Non-pressure chronic ulcer of right heel and midfoot with unspecified severity: Secondary | ICD-10-CM | POA: Diagnosis not present

## 2021-11-26 DIAGNOSIS — I2510 Atherosclerotic heart disease of native coronary artery without angina pectoris: Secondary | ICD-10-CM | POA: Diagnosis not present

## 2021-11-26 DIAGNOSIS — I509 Heart failure, unspecified: Secondary | ICD-10-CM | POA: Diagnosis not present

## 2021-11-26 DIAGNOSIS — G894 Chronic pain syndrome: Secondary | ICD-10-CM | POA: Diagnosis not present

## 2021-11-26 DIAGNOSIS — R262 Difficulty in walking, not elsewhere classified: Secondary | ICD-10-CM | POA: Diagnosis not present

## 2021-11-29 DIAGNOSIS — Z978 Presence of other specified devices: Secondary | ICD-10-CM | POA: Diagnosis not present

## 2021-11-29 DIAGNOSIS — Z6841 Body Mass Index (BMI) 40.0 and over, adult: Secondary | ICD-10-CM | POA: Diagnosis not present

## 2021-11-29 DIAGNOSIS — R0902 Hypoxemia: Secondary | ICD-10-CM | POA: Diagnosis not present

## 2021-11-29 DIAGNOSIS — R338 Other retention of urine: Secondary | ICD-10-CM | POA: Diagnosis not present

## 2021-11-29 DIAGNOSIS — M6281 Muscle weakness (generalized): Secondary | ICD-10-CM | POA: Diagnosis not present

## 2021-11-29 DIAGNOSIS — L97419 Non-pressure chronic ulcer of right heel and midfoot with unspecified severity: Secondary | ICD-10-CM | POA: Diagnosis not present

## 2021-12-04 DIAGNOSIS — R5383 Other fatigue: Secondary | ICD-10-CM | POA: Diagnosis not present

## 2021-12-04 DIAGNOSIS — G4733 Obstructive sleep apnea (adult) (pediatric): Secondary | ICD-10-CM | POA: Diagnosis not present

## 2021-12-04 DIAGNOSIS — R400 Somnolence: Secondary | ICD-10-CM | POA: Diagnosis not present

## 2021-12-04 DIAGNOSIS — J4520 Mild intermittent asthma, uncomplicated: Secondary | ICD-10-CM | POA: Diagnosis not present

## 2021-12-06 DIAGNOSIS — M6281 Muscle weakness (generalized): Secondary | ICD-10-CM | POA: Diagnosis not present

## 2021-12-06 DIAGNOSIS — L97419 Non-pressure chronic ulcer of right heel and midfoot with unspecified severity: Secondary | ICD-10-CM | POA: Diagnosis not present

## 2021-12-06 DIAGNOSIS — Z6841 Body Mass Index (BMI) 40.0 and over, adult: Secondary | ICD-10-CM | POA: Diagnosis not present

## 2021-12-13 ENCOUNTER — Ambulatory Visit: Payer: Medicare HMO

## 2021-12-13 DIAGNOSIS — L97419 Non-pressure chronic ulcer of right heel and midfoot with unspecified severity: Secondary | ICD-10-CM | POA: Diagnosis not present

## 2021-12-13 DIAGNOSIS — E1159 Type 2 diabetes mellitus with other circulatory complications: Secondary | ICD-10-CM | POA: Diagnosis not present

## 2021-12-13 DIAGNOSIS — M6281 Muscle weakness (generalized): Secondary | ICD-10-CM | POA: Diagnosis not present

## 2021-12-13 DIAGNOSIS — I509 Heart failure, unspecified: Secondary | ICD-10-CM | POA: Diagnosis not present

## 2021-12-13 DIAGNOSIS — R262 Difficulty in walking, not elsewhere classified: Secondary | ICD-10-CM | POA: Diagnosis not present

## 2021-12-13 DIAGNOSIS — Z6841 Body Mass Index (BMI) 40.0 and over, adult: Secondary | ICD-10-CM | POA: Diagnosis not present

## 2021-12-13 DIAGNOSIS — I4891 Unspecified atrial fibrillation: Secondary | ICD-10-CM | POA: Diagnosis not present

## 2021-12-17 DIAGNOSIS — N289 Disorder of kidney and ureter, unspecified: Secondary | ICD-10-CM | POA: Diagnosis not present

## 2021-12-17 DIAGNOSIS — R634 Abnormal weight loss: Secondary | ICD-10-CM | POA: Diagnosis not present

## 2021-12-17 DIAGNOSIS — K219 Gastro-esophageal reflux disease without esophagitis: Secondary | ICD-10-CM | POA: Diagnosis not present

## 2021-12-17 DIAGNOSIS — R110 Nausea: Secondary | ICD-10-CM | POA: Diagnosis not present

## 2021-12-17 DIAGNOSIS — R109 Unspecified abdominal pain: Secondary | ICD-10-CM | POA: Diagnosis not present

## 2021-12-17 DIAGNOSIS — S88112A Complete traumatic amputation at level between knee and ankle, left lower leg, initial encounter: Secondary | ICD-10-CM | POA: Diagnosis not present

## 2021-12-17 DIAGNOSIS — D649 Anemia, unspecified: Secondary | ICD-10-CM | POA: Diagnosis not present

## 2021-12-18 DIAGNOSIS — E1143 Type 2 diabetes mellitus with diabetic autonomic (poly)neuropathy: Secondary | ICD-10-CM | POA: Diagnosis not present

## 2021-12-18 DIAGNOSIS — Z6841 Body Mass Index (BMI) 40.0 and over, adult: Secondary | ICD-10-CM | POA: Diagnosis not present

## 2021-12-18 DIAGNOSIS — L97509 Non-pressure chronic ulcer of other part of unspecified foot with unspecified severity: Secondary | ICD-10-CM | POA: Diagnosis not present

## 2021-12-18 DIAGNOSIS — I480 Paroxysmal atrial fibrillation: Secondary | ICD-10-CM | POA: Diagnosis not present

## 2021-12-18 DIAGNOSIS — E11621 Type 2 diabetes mellitus with foot ulcer: Secondary | ICD-10-CM | POA: Diagnosis not present

## 2021-12-18 DIAGNOSIS — K219 Gastro-esophageal reflux disease without esophagitis: Secondary | ICD-10-CM | POA: Diagnosis not present

## 2021-12-18 DIAGNOSIS — I10 Essential (primary) hypertension: Secondary | ICD-10-CM | POA: Diagnosis not present

## 2021-12-20 ENCOUNTER — Ambulatory Visit (INDEPENDENT_AMBULATORY_CARE_PROVIDER_SITE_OTHER): Payer: Medicare HMO | Admitting: *Deleted

## 2021-12-20 DIAGNOSIS — T63441D Toxic effect of venom of bees, accidental (unintentional), subsequent encounter: Secondary | ICD-10-CM

## 2021-12-25 DIAGNOSIS — A419 Sepsis, unspecified organism: Secondary | ICD-10-CM | POA: Diagnosis not present

## 2021-12-25 DIAGNOSIS — N179 Acute kidney failure, unspecified: Secondary | ICD-10-CM | POA: Diagnosis not present

## 2021-12-26 DIAGNOSIS — E876 Hypokalemia: Secondary | ICD-10-CM | POA: Diagnosis not present

## 2022-01-01 DIAGNOSIS — R600 Localized edema: Secondary | ICD-10-CM | POA: Diagnosis not present

## 2022-01-01 DIAGNOSIS — Z6841 Body Mass Index (BMI) 40.0 and over, adult: Secondary | ICD-10-CM | POA: Diagnosis not present

## 2022-01-01 DIAGNOSIS — E1122 Type 2 diabetes mellitus with diabetic chronic kidney disease: Secondary | ICD-10-CM | POA: Diagnosis not present

## 2022-01-01 DIAGNOSIS — N183 Chronic kidney disease, stage 3 unspecified: Secondary | ICD-10-CM | POA: Diagnosis not present

## 2022-01-02 ENCOUNTER — Telehealth: Payer: Self-pay

## 2022-01-02 NOTE — Telephone Encounter (Signed)
Patient with diagnosis of atrial fibrillation  on Eliquis for anticoagulation.    Procedure: colonoscopy and EGD Date of procedure: TBD   CHA2DS2-VASc Score = 4   This indicates a 4.8% annual risk of stroke. The patient's score is based upon: CHF History: 1 HTN History: 1 Diabetes History: 1 Stroke History: 0 Vascular Disease History: 1 Age Score: 0 Gender Score: 0    CrCl 84 (with adjusted body weight) Platelet count 302  Per office protocol, patient can hold Eliquis for 2 days prior to procedure.   Patient will not need bridging with Lovenox (enoxaparin) around procedure.  **This guidance is not considered finalized until pre-operative APP has relayed final recommendations.**

## 2022-01-02 NOTE — Telephone Encounter (Signed)
   Name: David Fitzgerald  DOB: [birth year]  MRN: [id number]  Primary Cardiologist: Shirlee More, MD   Preoperative team, please contact this patient and set up a phone call appointment for further preoperative risk assessment. Please obtain consent and complete medication review. Thank you for your help.  I confirm that guidance regarding antiplatelet and oral anticoagulation therapy has been completed and, if necessary, noted below.  Clinical pharmacist have provided recommendations for holding anticoagulation.   Deberah Pelton, NP 01/02/2022, 4:47 PM [facility]

## 2022-01-02 NOTE — Telephone Encounter (Signed)
   Pre-operative Risk Assessment    Patient Name: David Fitzgerald  DOB: [birth year] MRN: [id number]      Request for Surgical Clearance    Procedure:   colonoscopy and EGD  Date of Surgery:  Clearance TBD                                 Surgeon:  Dr. Kyra Leyland Surgeon's Group or Practice Name:  [facility]  Phone number:  [phone] Fax number:  [phone]   Type of Clearance Requested:   - Pharmacy:  Hold Apixaban (Eliquis) 2 days prior to procedure   Type of Anesthesia:  Not Indicated   Additional requests/questions:    [name]   01/02/2022, 10:26 AM

## 2022-01-03 ENCOUNTER — Telehealth: Payer: Self-pay | Admitting: *Deleted

## 2022-01-03 DIAGNOSIS — G4733 Obstructive sleep apnea (adult) (pediatric): Secondary | ICD-10-CM | POA: Diagnosis not present

## 2022-01-03 NOTE — Telephone Encounter (Signed)
Telephone clearance appt made.  Med rec and consent done.

## 2022-01-03 NOTE — Telephone Encounter (Signed)
  Patient Consent for Virtual Visit         David Fitzgerald has provided verbal consent on 01/03/2022 for a virtual visit (video or telephone).   CONSENT FOR VIRTUAL VISIT FOR:  David Fitzgerald  By participating in this virtual visit I agree to the following:  I hereby voluntarily request, consent and authorize [facility] and its employed or contracted physicians, physician assistants, nurse practitioners or other licensed health care professionals (the Practitioner), to provide me with telemedicine health care services (the "Services") as deemed necessary by the treating Practitioner. I acknowledge and consent to receive the Services by the Practitioner via telemedicine. I understand that the telemedicine visit will involve communicating with the Practitioner through live audiovisual communication technology and the disclosure of certain medical information by electronic transmission. I acknowledge that I have been given the opportunity to request an in-person assessment or other available alternative prior to the telemedicine visit and am voluntarily participating in the telemedicine visit.  I understand that I have the right to withhold or withdraw my consent to the use of telemedicine in the course of my care at any time, without affecting my right to future care or treatment, and that the Practitioner or I may terminate the telemedicine visit at any time. I understand that I have the right to inspect all information obtained and/or recorded in the course of the telemedicine visit and may receive copies of available information for a reasonable fee.  I understand that some of the potential risks of receiving the Services via telemedicine include:  Delay or interruption in medical evaluation due to technological equipment failure or disruption; Information transmitted may not be sufficient (e.g. poor resolution of images) to allow for appropriate medical decision making by the  Practitioner; and/or  In rare instances, security protocols could fail, causing a breach of personal health information.  Furthermore, I acknowledge that it is my responsibility to provide information about my medical history, conditions and care that is complete and accurate to the best of my ability. I acknowledge that Practitioner's advice, recommendations, and/or decision may be based on factors not within their control, such as incomplete or inaccurate data provided by me or distortions of diagnostic images or specimens that may result from electronic transmissions. I understand that the practice of medicine is not an exact science and that Practitioner makes no warranties or guarantees regarding treatment outcomes. I acknowledge that a copy of this consent can be made available to me via my patient portal ([facility]), or I can request a printed copy by calling the office of [facility].    I understand that my insurance will be billed for this visit.   I have read or had this consent read to me. I understand the contents of this consent, which adequately explains the benefits and risks of the Services being provided via telemedicine.  I have been provided ample opportunity to ask questions regarding this consent and the Services and have had my questions answered to my satisfaction. I give my informed consent for the services to be provided through the use of telemedicine in my medical care

## 2022-01-07 DIAGNOSIS — S53115A Anterior dislocation of left ulnohumeral joint, initial encounter: Secondary | ICD-10-CM | POA: Diagnosis not present

## 2022-01-07 DIAGNOSIS — W050XXA Fall from non-moving wheelchair, initial encounter: Secondary | ICD-10-CM | POA: Diagnosis not present

## 2022-01-07 DIAGNOSIS — M25322 Other instability, left elbow: Secondary | ICD-10-CM | POA: Diagnosis not present

## 2022-01-07 DIAGNOSIS — M25822 Other specified joint disorders, left elbow: Secondary | ICD-10-CM | POA: Diagnosis not present

## 2022-01-07 DIAGNOSIS — G5622 Lesion of ulnar nerve, left upper limb: Secondary | ICD-10-CM | POA: Diagnosis not present

## 2022-01-08 ENCOUNTER — Ambulatory Visit: Payer: Medicare HMO | Admitting: Physician Assistant

## 2022-01-08 ENCOUNTER — Encounter: Payer: Self-pay | Admitting: Physician Assistant

## 2022-01-08 NOTE — Telephone Encounter (Signed)
See notes from Melina Copa, [facility]. I will need to ask the [place] office to please help in scheduling appt for the pt. IMPORTANT please see notes from Melina Copa, [facility] as the pt has been recently back in the hospital with chest pain and sepsis, as well as he did not f/u from his last appt as he was instructed. Iam going to cancel the tele appt for today per pre op app.

## 2022-01-08 NOTE — Progress Notes (Signed)
This patient was on schedule for virtual visit today. At last OV 11/01/21 with Dr. Bettina Gavia he was reported to have decompensated CHF with marked fluid overload in the range of least 20 lbs. He had titration of diuretics and addition of metolazone, with recommendation for hospitalization in the next 48 hours if he did not respond to diuretics. He was supposed to return in 1-2 weeks but he has not followed here since then. He was subsequently admitted to outside facility in 11/2021 with diagnoses of chest pain and sepsis. He is not a candidate for virtual visit and needs to be switched to in person visit. Have routed to callback in original phone note for clearance to cancel virtual and schedule in-person OV.

## 2022-01-08 NOTE — Telephone Encounter (Signed)
I s/w the pt and he is agreeable to in office appt and to cancel the tele appt for today. Pt is aware the [place] office will call him with an appt. Pt thanked me for the call and the help.

## 2022-01-08 NOTE — Progress Notes (Signed)
This encounter was created in error - please disregard. Not a VV candidate. Needs in person visit.

## 2022-01-08 NOTE — Telephone Encounter (Addendum)
   Name: [name]  DOB: [birth year]  MRN: [id number]  Primary Cardiologist: Shirlee More, MD  Chart reviewed as part of pre-operative protocol coverage. Because of David Fitzgerald's past medical history and time since last visit, he will require a follow-up in-office visit in order to better assess preoperative cardiovascular risk. This patient was on schedule for virtual visit today. At last OV 11/01/21 with Dr. Bettina Gavia he was reported to have decompensated CHF with marked fluid overload in the range of least 20 lbs. He had titration of diuretics and addition of metolazone, with recommendation for hospitalization in the next 48 hours if he did not respond to diuretics. He was supposed to return in 1-2 weeks but he has not followed here since then. He was subsequently admitted to outside facility in 11/2021 with diagnoses of chest pain and sepsis. He is not a candidate for virtual visit and needs to be switched to in person visit.   Pre-op covering staff: - Please schedule appointment and call patient to inform them.  - Please contact requesting surgeon's office via preferred method (i.e, phone, fax) to inform them of need for appointment prior to surgery.  Pharm clearance noted below, can finalize at time of OV.  David Pitter, PA-C  01/08/2022, 8:15 AM

## 2022-01-09 DIAGNOSIS — R400 Somnolence: Secondary | ICD-10-CM | POA: Diagnosis not present

## 2022-01-09 DIAGNOSIS — J4520 Mild intermittent asthma, uncomplicated: Secondary | ICD-10-CM | POA: Diagnosis not present

## 2022-01-09 DIAGNOSIS — G4733 Obstructive sleep apnea (adult) (pediatric): Secondary | ICD-10-CM | POA: Diagnosis not present

## 2022-01-09 DIAGNOSIS — R5383 Other fatigue: Secondary | ICD-10-CM | POA: Diagnosis not present

## 2022-01-11 DIAGNOSIS — N183 Chronic kidney disease, stage 3 unspecified: Secondary | ICD-10-CM | POA: Diagnosis not present

## 2022-01-16 ENCOUNTER — Other Ambulatory Visit (HOSPITAL_COMMUNITY): Payer: Self-pay | Admitting: Orthopedic Surgery

## 2022-01-16 DIAGNOSIS — M25322 Other instability, left elbow: Secondary | ICD-10-CM

## 2022-01-22 DIAGNOSIS — G894 Chronic pain syndrome: Secondary | ICD-10-CM | POA: Diagnosis not present

## 2022-01-22 DIAGNOSIS — Z79899 Other long term (current) drug therapy: Secondary | ICD-10-CM | POA: Diagnosis not present

## 2022-01-22 DIAGNOSIS — M1612 Unilateral primary osteoarthritis, left hip: Secondary | ICD-10-CM | POA: Diagnosis not present

## 2022-01-22 DIAGNOSIS — M47816 Spondylosis without myelopathy or radiculopathy, lumbar region: Secondary | ICD-10-CM | POA: Diagnosis not present

## 2022-01-22 DIAGNOSIS — M25552 Pain in left hip: Secondary | ICD-10-CM | POA: Diagnosis not present

## 2022-01-22 DIAGNOSIS — Z1389 Encounter for screening for other disorder: Secondary | ICD-10-CM | POA: Diagnosis not present

## 2022-01-22 DIAGNOSIS — M5136 Other intervertebral disc degeneration, lumbar region: Secondary | ICD-10-CM | POA: Diagnosis not present

## 2022-01-22 DIAGNOSIS — Z79891 Long term (current) use of opiate analgesic: Secondary | ICD-10-CM | POA: Diagnosis not present

## 2022-01-23 ENCOUNTER — Ambulatory Visit (HOSPITAL_COMMUNITY)
Admission: RE | Admit: 2022-01-23 | Discharge: 2022-01-23 | Disposition: A | Discharge status: Home / Self Care | Payer: Medicare HMO | Source: Ambulatory Visit | Attending: Orthopedic Surgery | Admitting: Orthopedic Surgery

## 2022-01-23 DIAGNOSIS — M25322 Other instability, left elbow: Secondary | ICD-10-CM | POA: Insufficient documentation

## 2022-01-23 DIAGNOSIS — M19022 Primary osteoarthritis, left elbow: Secondary | ICD-10-CM | POA: Diagnosis not present

## 2022-01-23 DIAGNOSIS — M25422 Effusion, left elbow: Secondary | ICD-10-CM | POA: Diagnosis not present

## 2022-01-24 NOTE — Progress Notes (Signed)
Cardiology Office Note:    Date:  01/25/2022   ID:  David Fitzgerald, DOB Jun 22, 1960, MRN [id number]  PCP:  David Sheriff, MD  Cardiologist:  David More, MD    Referring MD: David Sheriff, MD    ASSESSMENT:    1. Preop cardiovascular exam   2. Permanent atrial fibrillation (David Fitzgerald)   3. Chronic anticoagulation   4. Hypertensive heart disease without heart failure    PLAN:    In order of problems listed above:  From a cardiac perspective he is stable the procedure is low risk his heart failure is compensated atrial fibrillation is rate controlled we will withhold his anticoagulant for doses 48 hours prior and I told him he has a biopsy will need to hold for 48 hours afterwards and have received instructions by the surgeon Rate is controlled continue his beta-blocker calcium channel blocker See above regarding anticoagulants periprocedural Blood pressure is at target on his current combination including ARB loop diuretic MRA calcium blocker and beta-blocker   Next appointment: 3 months   Medication Adjustments/Labs and Tests Ordered: Current medicines are reviewed at length with the patient today.  Concerns regarding medicines are outlined above.  No orders of the defined types were placed in this encounter.  No orders of the defined types were placed in this encounter.   Chief complaint seen today in follow-up for heart failure after recent hospitalization and prior to colonoscopy   History of Present Illness:    David Fitzgerald is a 61 y.o. male with a hx of permanent atrial fibrillation with chronic anticoagulation hypertensive heart disease with heart failure mild LV dysfunction with an ejection fraction 40 to 45% moderate concentric LVH on echocardiogram 07/15/202 last seen by me 11/01/2021 with decompensated heart failure marked peripheral edema and weight gain of 15 to 20 pounds and orthopnea..  Request for Surgical Clearance     Procedure:   colonoscopy  and EGD   Date of Surgery:  Clearance TBD                                 Surgeon:  Dr. Christia Reading Fitzgerald Surgeon's Group or Practice Name:  [facility]  Phone number:  [phone] Fax number:  [phone]   Type of Clearance Requested:   - Pharmacy:  Hold Apixaban (Eliquis) 2 days prior to procedure   Type of Anesthesia:  Not Indicated   He was seen in [facility] by coverage cardiologist 11/15/2021 and admitted to the hospital with heart failure and atrial fibrillation with a rapid ventricular rates.  Compliance with diet, lifestyle and medications: Yes  His mother is present with arrhythmia details of hospitalization and need for him to have a colonoscopy His weight is down over 40 pounds he has no edema he is not short of breath no chest pain palpitation or syncope Past Medical History:  Diagnosis Date   A-fib ([place])    Abscess of ankle    Abscess of bursa of left ankle 10/04/2020   Allergy to pollen 04/06/2015   Ankle fracture 01/31/2020   Anxiety 02/06/2017   Arthritis 02/06/2017   Asthma    as a child   Atrial fibrillation with rapid ventricular response ([place]) 12/08/2020   Bee sting allergy    wasp / mixed vespid   Bell's palsy 02/24/2013   BMI 45.0-49.9, adult ([place]) 07/09/2012   CAD in native artery 02/07/2017  CHF (congestive heart failure) (HCC)    Chronic anticoagulation 03/07/2017   Chronic pain syndrome 07/09/2012   Chronic venous insufficiency 12/14/2014   Chronic, continuous use of opioids 02/15/2020   Coronary artery calcification seen on CT scan 02/07/2017   Depression    Diabetes ([place])    Dyspnea on minimal exertion 12/08/2020   Dysrhythmia    afib   Edema 12/14/2014   Epigastric pain 12/08/2020   GERD (gastroesophageal reflux disease)    Hematuria 12/08/2020   History of kidney stones    Hyperlipidemia, mixed 12/14/2014   Hypertension    Hypertensive heart disease 12/14/2014   Hypotension 12/25/2017    IBS (irritable bowel syndrome)    Instability of left elbow joint 02/22/2021   Left ankle pain 04/05/2020   Lumbar pain with radiation down left leg 07/09/2012   Morbid obesity ([place]) 12/14/2014   Morbid obesity with BMI of 40.0-44.9, adult ([place]) 12/14/2014   Morbid obesity with BMI of 45.0-49.9, adult ([place]) 07/09/2012   Nausea & vomiting 12/08/2020   Obesity hypoventilation syndrome ([place]) 12/14/2014   Open dislocation of left ankle    Open fracture dislocation of left ankle 01/31/2020   OSA (obstructive sleep apnea) 03/09/2020   doesn't use a Cpap   OSA treated with BiPAP 12/14/2014   Pain syndrome, chronic 07/09/2012   Permanent atrial fibrillation (HCC)    Pernicious anemia 02/06/2017   Persistent atrial fibrillation ([place]) 02/06/2017   CHADS2vasc of 2   Pneumonia    Polyneuropathy in diabetes ([place]) 02/24/2013   Subacute osteomyelitis, left ankle and foot ([place])    Syndrome affecting cervical region 02/24/2013   Trigeminal neuralgia 02/24/2013   Type 2 diabetes mellitus with diabetic neuropathy ([place]) 12/14/2014   Type II diabetes mellitus, uncontrolled 12/08/2020   Vitamin D deficiency 02/16/2020   Wound dehiscence, traumatic injury repair, left medial ankle  03/08/2020    Past Surgical History:  Procedure Laterality Date    INTERNAL FIXATION LEFT TIBIA TO CALCANEUS AND APPLY SKIN GRAFT (Left Ankle)  04/05/2020   ADENOIDECTOMY     AMPUTATION Left 10/04/2020   Procedure: LEFT BELOW KNEE AMPUTATION;  Surgeon: [name], MD;  Location: [place];  Service: Orthopedics;  Laterality: Left;   BACK SURGERY  2002 /2003   CARPAL TUNNEL RELEASE Bilateral    ELBOW LIGAMENT RECONSTRUCTION Left 03/02/2021   Procedure: LEFT ELBOW LATERAL ULNAR COLLATERAL LIGAMENT REPAIR/ POSSIBLE MEDIAL LIGAMENT REPAIR;  Surgeon: Sherilyn Cooter, MD;  Location: [place];  Service: Orthopedics;  Laterality: Left;   I & D EXTREMITY Left 01/31/2020   Procedure: IRRIGATION AND DEBRIDEMENT ANKLE;  Surgeon: Altamese [place], MD;  Location: [place];  Service: Orthopedics;  Laterality: Left;   I & D EXTREMITY Left 03/10/2020   Procedure: DEBRIDEMENT LEFT ANKLE, APPLY SKIN GRAFT;  Surgeon: [name], MD;  Location: [place];  Service: Orthopedics;  Laterality: Left;   KNEE SURGERY Left 2005   ORIF ANKLE FRACTURE Left 01/31/2020   ORIF ANKLE FRACTURE Left 01/31/2020   Procedure: OPEN REDUCTION INTERNAL FIXATION (ORIF) ANKLE FRACTURE;  Surgeon: Altamese [place], MD;  Location: [place];  Service: Orthopedics;  Laterality: Left;   ORIF ANKLE FRACTURE Left 04/05/2020   Procedure: INTERNAL FIXATION LEFT TIBIA TO CALCANEUS AND APPLY SKIN GRAFT;  Surgeon: [name], MD;  Location: [place];  Service: Orthopedics;  Laterality: Left;   SHOULDER SURGERY Left 1998   TONSILLECTOMY      Current Medications: Current Meds  Medication Sig   apixaban (ELIQUIS)  5 MG TABS tablet Take 1 tablet (5 mg total) by mouth 2 (two) times daily.   ascorbic acid (VITAMIN C) 1000 MG tablet Take 1,000 mg by mouth daily.   Cholecalciferol (VITAMIN D3) 125 MCG (5000 UT) TABS Take 5,000 Units by mouth daily.   Cyanocobalamin (B-12) 2500 MCG TABS Take 2,500 mcg by mouth daily.   diltiazem (CARDIZEM CD) 240 MG 24 hr capsule Take 240 mg by mouth daily.   DULoxetine (CYMBALTA) 30 MG capsule Take 30 mg by mouth at bedtime.   DULoxetine (CYMBALTA) 60 MG capsule Take 60 mg by mouth every morning.   EPINEPHrine 0.3 mg/0.3 mL IJ SOAJ injection Inject 0.3 mg into the muscle as needed for anaphylaxis.   gabapentin (NEURONTIN) 600 MG tablet Take 300 mg by mouth 2 (two) times daily.   Lactobacillus (PROBIOTIC GOLD EXTRA STRENGTH) CAPS Take 1 tablet by mouth daily.   metoprolol succinate (TOPROL-XL) 50 MG 24 hr tablet Take 3 tablets (150 mg total) by mouth daily.   naloxone (NARCAN) nasal spray 4 mg/0.1 mL Place 1 spray into the nose as needed for opioid reversal.   nitroGLYCERIN (NITROSTAT) 0.4 MG SL tablet Place 1 tablet (0.4 mg total) under the tongue  every 5 (five) minutes as needed for chest pain.   pantoprazole (PROTONIX) 40 MG tablet Take 40 mg by mouth daily.   potassium chloride (KLOR-CON) 10 MEQ tablet Take 10 mEq by mouth daily.   simvastatin (ZOCOR) 20 MG tablet Take 20 mg by mouth at bedtime.   spironolactone (ALDACTONE) 25 MG tablet Take 1 tablet (25 mg total) by mouth daily.   tamsulosin (FLOMAX) 0.4 MG CAPS capsule Take 0.4 mg by mouth daily.   torsemide (DEMADEX) 20 MG tablet Take 20 mg by mouth 2 (two) times daily.   valsartan (DIOVAN) 80 MG tablet Take 1 tablet (80 mg total) by mouth 2 (two) times daily.     Allergies:   Bee venom, Penicillins, and Aspirin   Social History   Socioeconomic History   Marital status: Married    Spouse name: Not on file   Number of children: Not on file   Years of education: Not on file   Highest education level: Not on file  Occupational History   Not on file  Tobacco Use   Smoking status: Former    Packs/day: 3.00    Years: 35.00    Total pack years: 105.00    Types: Cigarettes    Quit date: 04/13/2006    Years since quitting: 15.7   Smokeless tobacco: Never  Vaping Use   Vaping Use: Never used  Substance and Sexual Activity   Alcohol use: No    Alcohol/week: 0.0 standard drinks of alcohol   Drug use: No   Sexual activity: Not on file  Other Topics Concern   Not on file  Social History Narrative   Not on file   Social Determinants of Health   Financial Resource Strain: Not on file  Food Insecurity: Not on file  Transportation Needs: No Transportation Needs (04/13/2020)   PRAPARE - Hydrologist (Medical): No    Lack of Transportation (Non-Medical): No  Physical Activity: Not on file  Stress: Not on file  Social Connections: Not on file     Family History: The patient's family history includes Atrial fibrillation in his father; Breast cancer in his maternal grandmother and mother; CAD in his father; Heart disease in his father and  paternal uncle; Stomach cancer  in his paternal aunt. ROS:   Please see the history of present illness.    All other systems reviewed and are negative.  EKGs/Labs/Other Studies Reviewed:    The following studies were reviewed today:  EKG:  EKG ordered today and personally reviewed.  The ekg ordered today demonstrates atrial fibrillation he has a controlled ventricular rate incomplete right bundle branch block  Recent Labs: 03/02/2021: Hemoglobin 15.4; Platelets 246 11/01/2021: BUN 21; Creatinine, Ser 1.57; NT-Pro BNP 247; Potassium 4.8; Sodium 139  Recent Lipid Panel    Component Value Date/Time   CHOL 156 12/03/2018 1502   TRIG 301 (H) 12/03/2018 1502   HDL 29 (L) 12/03/2018 1502   CHOLHDL 5.4 (H) 12/03/2018 1502   LDLCALC 78 12/03/2018 1502    Physical Exam:    VS:  BP 101/62 (BP Location: Right Wrist, Patient Position: Sitting)   Pulse 79   Ht '6\' 2"'$  (1.88 m)   Wt (!) 357 lb (161.9 kg)   SpO2 96%   BMI 45.84 kg/m     Wt Readings from Last 3 Encounters:  01/25/22 (!) 357 lb (161.9 kg)  11/01/21 (!) 396 lb (179.6 kg)  09/04/21 (!) 380 lb (172.4 kg)     GEN: Quite obese well nourished, well developed in no acute distress HEENT: Normal NECK: No JVD; No carotid bruits LYMPHATICS: No lymphadenopathy CARDIAC: Distant heart sounds irregular rate and rhythm no murmurs, rubs, gallops RESPIRATORY:  Clear to auscultation without rales, wheezing or rhonchi  ABDOMEN: Soft, non-tender, non-distended MUSCULOSKELETAL:  No edema; No deformity  SKIN: Warm and dry NEUROLOGIC:  Alert and oriented x 3 PSYCHIATRIC:  Normal affect    Signed, David More, MD  01/25/2022 2:05 PM    [place]

## 2022-01-25 ENCOUNTER — Encounter: Payer: Self-pay | Admitting: Cardiology

## 2022-01-25 ENCOUNTER — Ambulatory Visit: Payer: Medicare HMO | Attending: Cardiology | Admitting: Cardiology

## 2022-01-25 VITALS — BP 101/62 | HR 79 | Ht 74.0 in | Wt 357.0 lb

## 2022-01-25 DIAGNOSIS — I119 Hypertensive heart disease without heart failure: Secondary | ICD-10-CM

## 2022-01-25 DIAGNOSIS — Z01810 Encounter for preprocedural cardiovascular examination: Secondary | ICD-10-CM

## 2022-01-25 DIAGNOSIS — Z7901 Long term (current) use of anticoagulants: Secondary | ICD-10-CM

## 2022-01-25 DIAGNOSIS — I4821 Permanent atrial fibrillation: Secondary | ICD-10-CM

## 2022-01-25 NOTE — Patient Instructions (Signed)
Medication Instructions:  Your physician has recommended you make the following change in your medication:  Hold Eliquis for 2 days prior to procedure: Jan 9th  *If you need a refill on your cardiac medications before your next appointment, please call your pharmacy*   Lab Work: NONE If you have labs (blood work) drawn today and your tests are completely normal, you will receive your results only by: [place] (if you have MyChart) OR A paper copy in the mail If you have any lab test that is abnormal or we need to change your treatment, we will call you to review the results.   Testing/Procedures: NONE   Follow-Up: At [facility], you and your health needs are our priority.  As part of our continuing mission to provide you with exceptional heart care, we have created designated Provider Care Teams.  These Care Teams include your primary Cardiologist (physician) and Advanced Practice Providers (APPs -  Physician Assistants and Nurse Practitioners) who all work together to provide you with the care you need, when you need it.  We recommend signing up for the patient portal called "MyChart".  Sign up information is provided on this After Visit Summary.  MyChart is used to connect with patients for Virtual Visits (Telemedicine).  Patients are able to view lab/test results, encounter notes, upcoming appointments, etc.  Non-urgent messages can be sent to your provider as well.   To learn more about what you can do with MyChart, go to [email or web address].    Your next appointment:   3 month(s)  The format for your next appointment:   In Person  Provider:   Shirlee More, MD    Other Instructions   Important Information About Sugar

## 2022-02-05 DIAGNOSIS — K219 Gastro-esophageal reflux disease without esophagitis: Secondary | ICD-10-CM | POA: Diagnosis not present

## 2022-02-05 DIAGNOSIS — D649 Anemia, unspecified: Secondary | ICD-10-CM | POA: Diagnosis not present

## 2022-02-05 DIAGNOSIS — R109 Unspecified abdominal pain: Secondary | ICD-10-CM | POA: Diagnosis not present

## 2022-02-05 DIAGNOSIS — R110 Nausea: Secondary | ICD-10-CM | POA: Diagnosis not present

## 2022-02-05 DIAGNOSIS — M47816 Spondylosis without myelopathy or radiculopathy, lumbar region: Secondary | ICD-10-CM | POA: Diagnosis not present

## 2022-02-05 DIAGNOSIS — M545 Low back pain, unspecified: Secondary | ICD-10-CM | POA: Diagnosis not present

## 2022-02-05 DIAGNOSIS — R634 Abnormal weight loss: Secondary | ICD-10-CM | POA: Diagnosis not present

## 2022-02-13 ENCOUNTER — Telehealth: Payer: Self-pay

## 2022-02-13 NOTE — Patient Outreach (Signed)
  Care Coordination   Initial Visit Note   02/13/2022 Name: David Fitzgerald MRN: [id number] DOB: 1960-04-21  David Fitzgerald is a 62 y.o. year old male who sees [place], Angelique Blonder, MD for primary care. I spoke with  David Fitzgerald by phone today.  What matters to the patients health and wellness today?  Placed call to patient to review and offer [facility] care coordination program. Patient reports that he is doing well. Reports self monitor CBG 3 times a week with a range of 116-170.  Not currently on medications. Patient reports that Afib under good control. Denies any current needs.      SDOH assessments and interventions completed:  No     Care Coordination Interventions:  No, not indicated   Follow up plan: No further intervention required.   Encounter Outcome:  Pt. Refused   Tomasa Rand, RN, BSN, CEN [facility] [employer] [phone]

## 2022-02-14 ENCOUNTER — Ambulatory Visit: Payer: Medicare HMO

## 2022-02-14 DIAGNOSIS — D649 Anemia, unspecified: Secondary | ICD-10-CM | POA: Diagnosis not present

## 2022-02-14 DIAGNOSIS — K219 Gastro-esophageal reflux disease without esophagitis: Secondary | ICD-10-CM | POA: Diagnosis not present

## 2022-02-14 DIAGNOSIS — Z79899 Other long term (current) drug therapy: Secondary | ICD-10-CM | POA: Diagnosis not present

## 2022-02-14 DIAGNOSIS — J45909 Unspecified asthma, uncomplicated: Secondary | ICD-10-CM | POA: Diagnosis not present

## 2022-02-14 DIAGNOSIS — D124 Benign neoplasm of descending colon: Secondary | ICD-10-CM | POA: Diagnosis not present

## 2022-02-14 DIAGNOSIS — Z7901 Long term (current) use of anticoagulants: Secondary | ICD-10-CM | POA: Diagnosis not present

## 2022-02-14 DIAGNOSIS — D509 Iron deficiency anemia, unspecified: Secondary | ICD-10-CM | POA: Diagnosis not present

## 2022-02-14 DIAGNOSIS — I4891 Unspecified atrial fibrillation: Secondary | ICD-10-CM | POA: Diagnosis not present

## 2022-02-14 DIAGNOSIS — R634 Abnormal weight loss: Secondary | ICD-10-CM | POA: Diagnosis not present

## 2022-02-14 DIAGNOSIS — Z86010 Personal history of colonic polyps: Secondary | ICD-10-CM | POA: Diagnosis not present

## 2022-02-14 DIAGNOSIS — E119 Type 2 diabetes mellitus without complications: Secondary | ICD-10-CM | POA: Diagnosis not present

## 2022-02-14 DIAGNOSIS — D122 Benign neoplasm of ascending colon: Secondary | ICD-10-CM | POA: Diagnosis not present

## 2022-02-14 DIAGNOSIS — I10 Essential (primary) hypertension: Secondary | ICD-10-CM | POA: Diagnosis not present

## 2022-02-14 DIAGNOSIS — K635 Polyp of colon: Secondary | ICD-10-CM | POA: Diagnosis not present

## 2022-02-19 DIAGNOSIS — G4733 Obstructive sleep apnea (adult) (pediatric): Secondary | ICD-10-CM | POA: Diagnosis not present

## 2022-02-21 ENCOUNTER — Ambulatory Visit (INDEPENDENT_AMBULATORY_CARE_PROVIDER_SITE_OTHER): Payer: Medicare HMO | Admitting: *Deleted

## 2022-02-21 DIAGNOSIS — T63441D Toxic effect of venom of bees, accidental (unintentional), subsequent encounter: Secondary | ICD-10-CM | POA: Diagnosis not present

## 2022-02-25 DIAGNOSIS — G5622 Lesion of ulnar nerve, left upper limb: Secondary | ICD-10-CM | POA: Diagnosis not present

## 2022-02-25 DIAGNOSIS — G629 Polyneuropathy, unspecified: Secondary | ICD-10-CM | POA: Diagnosis not present

## 2022-02-25 DIAGNOSIS — G5602 Carpal tunnel syndrome, left upper limb: Secondary | ICD-10-CM | POA: Diagnosis not present

## 2022-02-26 DIAGNOSIS — E1122 Type 2 diabetes mellitus with diabetic chronic kidney disease: Secondary | ICD-10-CM | POA: Diagnosis not present

## 2022-02-26 DIAGNOSIS — I129 Hypertensive chronic kidney disease with stage 1 through stage 4 chronic kidney disease, or unspecified chronic kidney disease: Secondary | ICD-10-CM | POA: Diagnosis not present

## 2022-02-26 DIAGNOSIS — N390 Urinary tract infection, site not specified: Secondary | ICD-10-CM | POA: Diagnosis not present

## 2022-02-26 DIAGNOSIS — N184 Chronic kidney disease, stage 4 (severe): Secondary | ICD-10-CM | POA: Diagnosis not present

## 2022-02-27 DIAGNOSIS — Z79899 Other long term (current) drug therapy: Secondary | ICD-10-CM | POA: Diagnosis not present

## 2022-02-27 DIAGNOSIS — Z79891 Long term (current) use of opiate analgesic: Secondary | ICD-10-CM | POA: Diagnosis not present

## 2022-02-27 DIAGNOSIS — M1612 Unilateral primary osteoarthritis, left hip: Secondary | ICD-10-CM | POA: Diagnosis not present

## 2022-02-27 DIAGNOSIS — M47816 Spondylosis without myelopathy or radiculopathy, lumbar region: Secondary | ICD-10-CM | POA: Diagnosis not present

## 2022-02-27 DIAGNOSIS — M25552 Pain in left hip: Secondary | ICD-10-CM | POA: Diagnosis not present

## 2022-02-27 DIAGNOSIS — G894 Chronic pain syndrome: Secondary | ICD-10-CM | POA: Diagnosis not present

## 2022-02-27 DIAGNOSIS — M5136 Other intervertebral disc degeneration, lumbar region: Secondary | ICD-10-CM | POA: Diagnosis not present

## 2022-02-27 DIAGNOSIS — Z1389 Encounter for screening for other disorder: Secondary | ICD-10-CM | POA: Diagnosis not present

## 2022-03-01 DIAGNOSIS — S53115D Anterior dislocation of left ulnohumeral joint, subsequent encounter: Secondary | ICD-10-CM | POA: Diagnosis not present

## 2022-03-01 DIAGNOSIS — W050XXD Fall from non-moving wheelchair, subsequent encounter: Secondary | ICD-10-CM | POA: Diagnosis not present

## 2022-03-05 DIAGNOSIS — J4520 Mild intermittent asthma, uncomplicated: Secondary | ICD-10-CM | POA: Diagnosis not present

## 2022-03-05 DIAGNOSIS — R400 Somnolence: Secondary | ICD-10-CM | POA: Diagnosis not present

## 2022-03-05 DIAGNOSIS — R5383 Other fatigue: Secondary | ICD-10-CM | POA: Diagnosis not present

## 2022-03-05 DIAGNOSIS — G4733 Obstructive sleep apnea (adult) (pediatric): Secondary | ICD-10-CM | POA: Diagnosis not present

## 2022-03-19 IMAGING — RF DG ELBOW 2V*L*
1 series · 3 of 3 positions shown · non-contrast
Comparison: Xr left elbow 02/20/21.

CLINICAL DATA: LEFT ELBOW LATERAL ULNAR COLLATERAL LIGAMENT REPAIR/
POSSIBLE MEDIAL LIGAMENT REPAIR

EXAM:
LEFT ELBOW - 2 VIEW; DG C-ARM 1-60 MIN-NO REPORT

[Series 1: run · 3 of 3 slices shown]
[im 1/3]
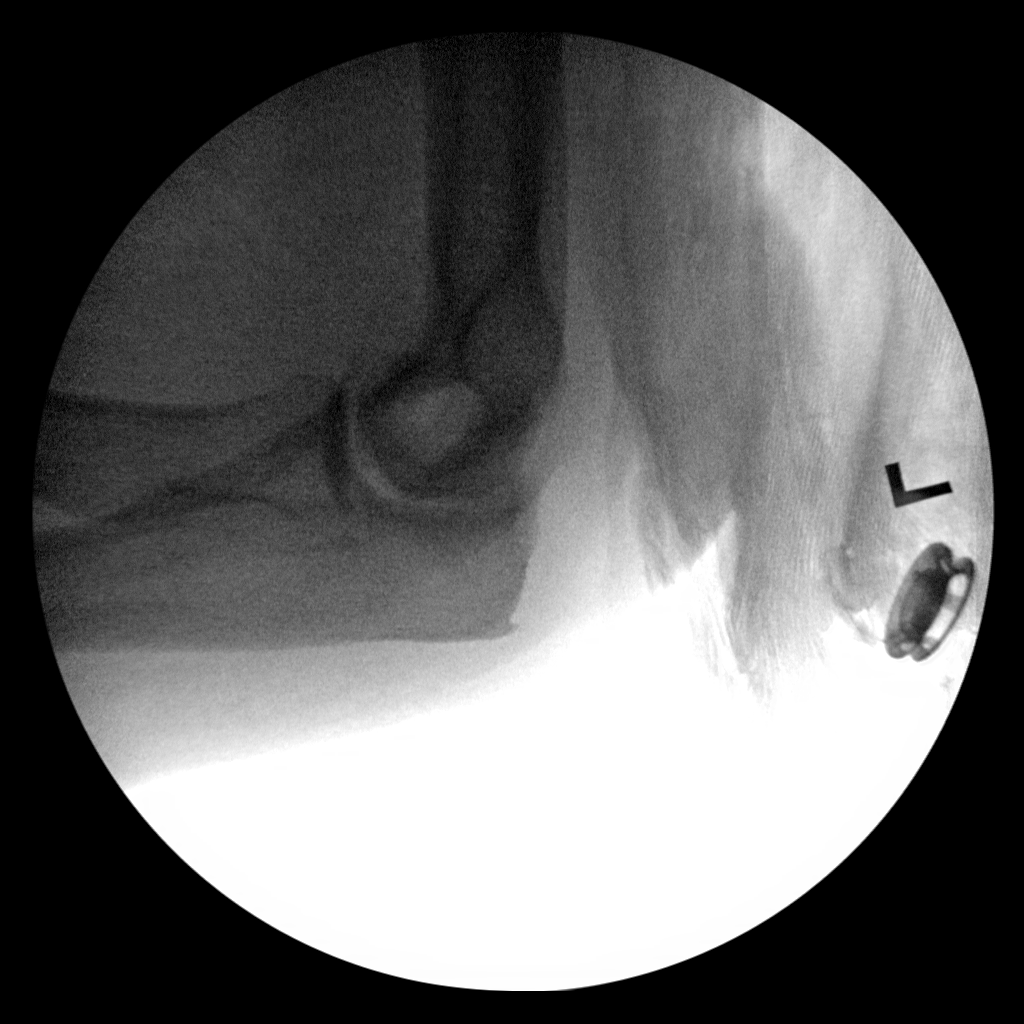
[im 2/3]
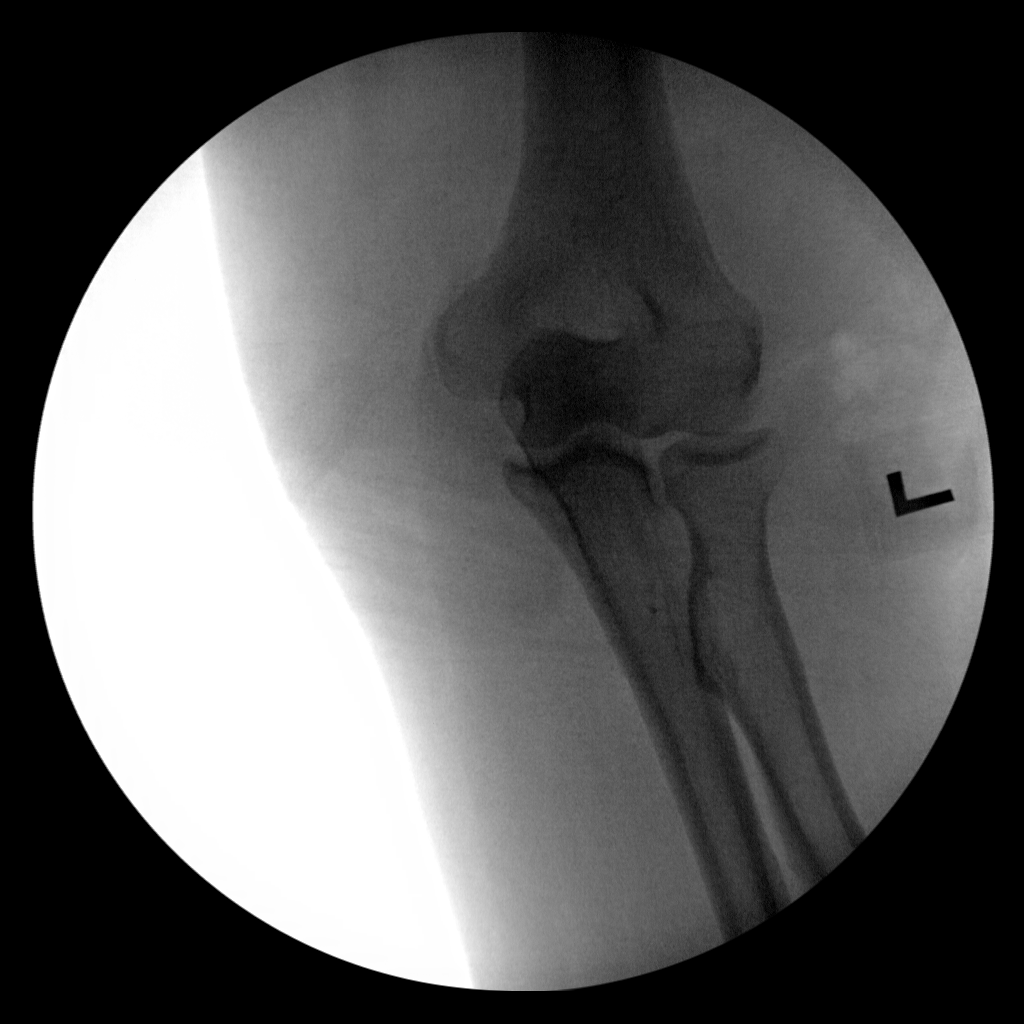
[im 3/3]
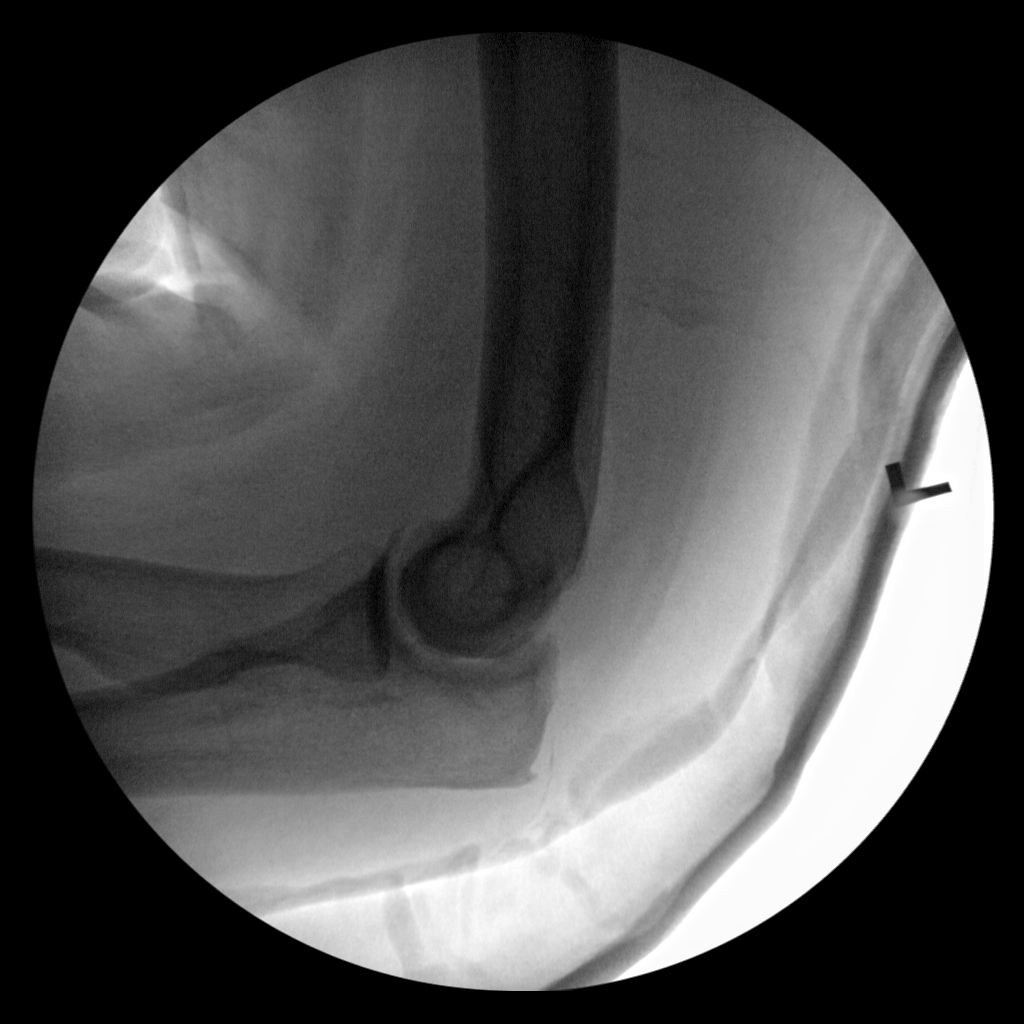

[3 of 3 positions shown; findings below may reference images not displayed]

FINDINGS: Intraoperative  LEFT ELBOW LATERAL ULNAR COLLATERAL LIGAMENT REPAIR.

3 low resolution intraoperative spot views of the left elbow were
obtained. No fracture visible on the limited views.

Total fluoroscopy time: 22 seconds

Total radiation dose:
IMPRESSION: Intraoperative  LEFT ELBOW LATERAL ULNAR COLLATERAL LIGAMENT REPAIR.

## 2022-03-28 DIAGNOSIS — Z79891 Long term (current) use of opiate analgesic: Secondary | ICD-10-CM | POA: Diagnosis not present

## 2022-03-28 DIAGNOSIS — Z79899 Other long term (current) drug therapy: Secondary | ICD-10-CM | POA: Diagnosis not present

## 2022-03-28 DIAGNOSIS — M5136 Other intervertebral disc degeneration, lumbar region: Secondary | ICD-10-CM | POA: Diagnosis not present

## 2022-03-28 DIAGNOSIS — Z1389 Encounter for screening for other disorder: Secondary | ICD-10-CM | POA: Diagnosis not present

## 2022-03-28 DIAGNOSIS — M25552 Pain in left hip: Secondary | ICD-10-CM | POA: Diagnosis not present

## 2022-03-28 DIAGNOSIS — M1612 Unilateral primary osteoarthritis, left hip: Secondary | ICD-10-CM | POA: Diagnosis not present

## 2022-03-28 DIAGNOSIS — G894 Chronic pain syndrome: Secondary | ICD-10-CM | POA: Diagnosis not present

## 2022-03-28 DIAGNOSIS — M47816 Spondylosis without myelopathy or radiculopathy, lumbar region: Secondary | ICD-10-CM | POA: Diagnosis not present

## 2022-03-29 DIAGNOSIS — G5622 Lesion of ulnar nerve, left upper limb: Secondary | ICD-10-CM | POA: Diagnosis not present

## 2022-03-29 DIAGNOSIS — S53115D Anterior dislocation of left ulnohumeral joint, subsequent encounter: Secondary | ICD-10-CM | POA: Diagnosis not present

## 2022-03-29 DIAGNOSIS — W050XXD Fall from non-moving wheelchair, subsequent encounter: Secondary | ICD-10-CM | POA: Diagnosis not present

## 2022-03-29 DIAGNOSIS — G5602 Carpal tunnel syndrome, left upper limb: Secondary | ICD-10-CM | POA: Diagnosis not present

## 2022-03-29 DIAGNOSIS — M19022 Primary osteoarthritis, left elbow: Secondary | ICD-10-CM | POA: Diagnosis not present

## 2022-03-29 DIAGNOSIS — M25322 Other instability, left elbow: Secondary | ICD-10-CM | POA: Diagnosis not present

## 2022-04-08 DIAGNOSIS — E7439 Other disorders of intestinal carbohydrate absorption: Secondary | ICD-10-CM | POA: Diagnosis not present

## 2022-04-08 DIAGNOSIS — E1143 Type 2 diabetes mellitus with diabetic autonomic (poly)neuropathy: Secondary | ICD-10-CM | POA: Diagnosis not present

## 2022-04-18 ENCOUNTER — Ambulatory Visit (INDEPENDENT_AMBULATORY_CARE_PROVIDER_SITE_OTHER): Payer: Medicare HMO | Admitting: *Deleted

## 2022-04-18 DIAGNOSIS — T63441D Toxic effect of venom of bees, accidental (unintentional), subsequent encounter: Secondary | ICD-10-CM

## 2022-04-19 DIAGNOSIS — I482 Chronic atrial fibrillation, unspecified: Secondary | ICD-10-CM | POA: Diagnosis not present

## 2022-04-19 DIAGNOSIS — F419 Anxiety disorder, unspecified: Secondary | ICD-10-CM | POA: Diagnosis not present

## 2022-04-19 DIAGNOSIS — I4891 Unspecified atrial fibrillation: Secondary | ICD-10-CM | POA: Diagnosis not present

## 2022-04-19 DIAGNOSIS — Z7901 Long term (current) use of anticoagulants: Secondary | ICD-10-CM | POA: Diagnosis not present

## 2022-04-19 DIAGNOSIS — I509 Heart failure, unspecified: Secondary | ICD-10-CM | POA: Diagnosis not present

## 2022-04-19 DIAGNOSIS — S53115S Anterior dislocation of left ulnohumeral joint, sequela: Secondary | ICD-10-CM | POA: Diagnosis not present

## 2022-04-19 DIAGNOSIS — Z01818 Encounter for other preprocedural examination: Secondary | ICD-10-CM | POA: Diagnosis not present

## 2022-04-19 DIAGNOSIS — I2510 Atherosclerotic heart disease of native coronary artery without angina pectoris: Secondary | ICD-10-CM | POA: Diagnosis not present

## 2022-04-19 DIAGNOSIS — G894 Chronic pain syndrome: Secondary | ICD-10-CM | POA: Diagnosis not present

## 2022-04-19 DIAGNOSIS — I872 Venous insufficiency (chronic) (peripheral): Secondary | ICD-10-CM | POA: Diagnosis not present

## 2022-04-19 DIAGNOSIS — G510 Bell's palsy: Secondary | ICD-10-CM | POA: Diagnosis not present

## 2022-04-28 NOTE — Progress Notes (Unsigned)
Cardiology Office Note:    Date:  05/01/2022   ID:  David Fitzgerald, DOB 09/21/1960, MRN [id number]  PCP:  Angelina Sheriff, MD  Cardiologist:  Shirlee More, MD    Referring MD: Angelina Sheriff, MD    ASSESSMENT:    1. Hypotension, unspecified hypotension type   2. AKI (acute kidney injury) ([place])   3. Permanent atrial fibrillation ([place])   4. Chronic anticoagulation   5. Hypertensive heart disease without heart failure    PLAN:    In order of problems listed above:  Although he is here to see me as a cardiology follow-up is obvious his predominant problem is hypotension and acute kidney injury.  I am unsure what the precipitant was but is very clear he needs to stop his ARB calcium channel blocker MRA continue to hold his diuretics and potassium he sees a nephrologist tomorrow.  I told him I expect his kidney function recovered back to his baseline. Check blood pressure or heart rate at home daily and leave a list with me in the office Monday Continue his Eliquis no dose adjustment for change of kidney function reduced beta-blocker for heart rate control with atrial fibrillation   Next appointment: I will see him sooner in 6 weeks.   Medication Adjustments/Labs and Tests Ordered: Current medicines are reviewed at length with the patient today.  Concerns regarding medicines are outlined above.  No orders of the defined types were placed in this encounter.  No orders of the defined types were placed in this encounter.  Chief complaint my blood pressure is low and I have had a big change in my kidney function surgery at [place] was canceled   History of Present Illness:    David Fitzgerald is a 62 y.o. male with a hx of  permanent atrial fibrillation with chronic anticoagulation hypertensive heart disease with heart failure mild LV dysfunction with an ejection fraction 40 to 45% moderate concentric LVH last seen 01/25/2022.  Recently had do preoperatively before  reconstructive surgery on his left elbow labs were performed his baseline creatinine from 6 months ago was 1.57 GFR 50 cc. 04/19/2022 his creatinine was 3.1 potassium 4.5 GFR 22 cc/min.  He saw pulmonary doctor who stopped his loop diuretic and potassium reduced his beta-blocker to once daily he also has been started on CPAP for his obstructive sleep apnea He is continuing to take an ARB and MRA.  Both he will stop his calcium channel blocker today only the beta-blocker I told him I expect he will have repeat labs tomorrow with the nephrology visit.  I asked him to drop off a list of blood pressures and heart rates in my office on Monday  He is surprisingly relatively asymptomatic except he gets lightheaded when he stands up he is not having syncope palpitation edema shortness of breath or chest pain  Compliance with diet, lifestyle and medications: Yes Past Medical History:  Diagnosis Date   A-fib ([place])    Abscess of ankle    Abscess of bursa of left ankle 10/04/2020   Allergy to pollen 04/06/2015   Ankle fracture 01/31/2020   Anxiety 02/06/2017   Arthritis 02/06/2017   Asthma    as a child   Atrial fibrillation with rapid ventricular response ([place]) 12/08/2020   Bee sting allergy    wasp / mixed vespid   Bell's palsy 02/24/2013   BMI 45.0-49.9, adult ([place]) 07/09/2012   CAD in native artery 02/07/2017   CHF (  congestive heart failure) (HCC)    Chronic anticoagulation 03/07/2017   Chronic pain syndrome 07/09/2012   Chronic venous insufficiency 12/14/2014   Chronic, continuous use of opioids 02/15/2020   Coronary artery calcification seen on CT scan 02/07/2017   Depression    Diabetes ([place])    Dyspnea on minimal exertion 12/08/2020   Dysrhythmia    afib   Edema 12/14/2014   Epigastric pain 12/08/2020   GERD (gastroesophageal reflux disease)    Hematuria 12/08/2020   History of kidney stones    Hyperlipidemia, mixed 12/14/2014   Hypertension    Hypertensive heart disease  12/14/2014   Hypotension 12/25/2017   IBS (irritable bowel syndrome)    Instability of left elbow joint 02/22/2021   Left ankle pain 04/05/2020   Lumbar pain with radiation down left leg 07/09/2012   Morbid obesity ([place]) 12/14/2014   Morbid obesity with BMI of 40.0-44.9, adult ([place]) 12/14/2014   Morbid obesity with BMI of 45.0-49.9, adult ([place]) 07/09/2012   Nausea & vomiting 12/08/2020   Obesity hypoventilation syndrome ([place]) 12/14/2014   Open dislocation of left ankle    Open fracture dislocation of left ankle 01/31/2020   OSA (obstructive sleep apnea) 03/09/2020   doesn't use a Cpap   OSA treated with BiPAP 12/14/2014   Pain syndrome, chronic 07/09/2012   Permanent atrial fibrillation (HCC)    Pernicious anemia 02/06/2017   Persistent atrial fibrillation ([place]) 02/06/2017   CHADS2vasc of 2   Pneumonia    Polyneuropathy in diabetes ([place]) 02/24/2013   Subacute osteomyelitis, left ankle and foot ([place])    Syndrome affecting cervical region 02/24/2013   Trigeminal neuralgia 02/24/2013   Type 2 diabetes mellitus with diabetic neuropathy ([place]) 12/14/2014   Type II diabetes mellitus, uncontrolled 12/08/2020   Vitamin D deficiency 02/16/2020   Wound dehiscence, traumatic injury repair, left medial ankle  03/08/2020    Past Surgical History:  Procedure Laterality Date    INTERNAL FIXATION LEFT TIBIA TO CALCANEUS AND APPLY SKIN GRAFT (Left Ankle)  04/05/2020   ADENOIDECTOMY     AMPUTATION Left 10/04/2020   Procedure: LEFT BELOW KNEE AMPUTATION;  Surgeon: [name], MD;  Location: [facility];  Service: Orthopedics;  Laterality: Left;   BACK SURGERY  2002 /2003   CARPAL TUNNEL RELEASE Bilateral    ELBOW LIGAMENT RECONSTRUCTION Left 03/02/2021   Procedure: LEFT ELBOW LATERAL ULNAR COLLATERAL LIGAMENT REPAIR/ POSSIBLE MEDIAL LIGAMENT REPAIR;  Surgeon: Sherilyn Cooter, MD;  Location: [facility];  Service: Orthopedics;  Laterality: Left;   I & D EXTREMITY Left 01/31/2020   Procedure: IRRIGATION  AND DEBRIDEMENT ANKLE;  Surgeon: Altamese [place], MD;  Location: [facility];  Service: Orthopedics;  Laterality: Left;   I & D EXTREMITY Left 03/10/2020   Procedure: DEBRIDEMENT LEFT ANKLE, APPLY SKIN GRAFT;  Surgeon: [name], MD;  Location: [facility];  Service: Orthopedics;  Laterality: Left;   KNEE SURGERY Left 2005   ORIF ANKLE FRACTURE Left 01/31/2020   ORIF ANKLE FRACTURE Left 01/31/2020   Procedure: OPEN REDUCTION INTERNAL FIXATION (ORIF) ANKLE FRACTURE;  Surgeon: Altamese Dolores, MD;  Location: [facility];  Service: Orthopedics;  Laterality: Left;   ORIF ANKLE FRACTURE Left 04/05/2020   Procedure: INTERNAL FIXATION LEFT TIBIA TO CALCANEUS AND APPLY SKIN GRAFT;  Surgeon: [name], MD;  Location: [facility];  Service: Orthopedics;  Laterality: Left;   SHOULDER SURGERY Left 1998   TONSILLECTOMY      Current Medications: Current Meds  Medication Sig   apixaban (ELIQUIS) 5  MG TABS tablet Take 1 tablet (5 mg total) by mouth 2 (two) times daily.   ascorbic acid (VITAMIN C) 1000 MG tablet Take 1,000 mg by mouth daily.   Cholecalciferol (VITAMIN D3) 125 MCG (5000 UT) TABS Take 5,000 Units by mouth daily.   Cyanocobalamin (B-12) 2500 MCG TABS Take 2,500 mcg by mouth daily.   diltiazem (CARDIZEM CD) 240 MG 24 hr capsule Take 240 mg by mouth daily.   DULoxetine (CYMBALTA) 30 MG capsule Take 30 mg by mouth at bedtime.   DULoxetine (CYMBALTA) 60 MG capsule Take 60 mg by mouth every morning.   EPINEPHrine 0.3 mg/0.3 mL IJ SOAJ injection Inject 0.3 mg into the muscle as needed for anaphylaxis.   gabapentin (NEURONTIN) 600 MG tablet Take 300 mg by mouth 2 (two) times daily.   Lactobacillus (PROBIOTIC GOLD EXTRA STRENGTH) CAPS Take 1 tablet by mouth daily.   metoprolol succinate (TOPROL-XL) 50 MG 24 hr tablet Take 3 tablets (150 mg total) by mouth daily. (Patient taking differently: Take 50 mg by mouth daily.)   naloxone (NARCAN) nasal spray 4 mg/0.1 mL Place 1 spray into the nose as needed for opioid reversal.    nitroGLYCERIN (NITROSTAT) 0.4 MG SL tablet Place 1 tablet (0.4 mg total) under the tongue every 5 (five) minutes as needed for chest pain.   pantoprazole (PROTONIX) 40 MG tablet Take 40 mg by mouth daily.   simvastatin (ZOCOR) 20 MG tablet Take 20 mg by mouth at bedtime.   spironolactone (ALDACTONE) 25 MG tablet Take 1 tablet (25 mg total) by mouth daily.   tamsulosin (FLOMAX) 0.4 MG CAPS capsule Take 0.4 mg by mouth daily.   torsemide (DEMADEX) 20 MG tablet Take 20 mg by mouth 2 (two) times daily.   valsartan (DIOVAN) 80 MG tablet Take 1 tablet (80 mg total) by mouth 2 (two) times daily.     Allergies:   Bee venom, Penicillins, and Aspirin   Social History   Socioeconomic History   Marital status: Married    Spouse name: Not on file   Number of children: Not on file   Years of education: Not on file   Highest education level: Not on file  Occupational History   Not on file  Tobacco Use   Smoking status: Former    Packs/day: 3.00    Years: 35.00    Additional pack years: 0.00    Total pack years: 105.00    Types: Cigarettes    Quit date: 04/13/2006    Years since quitting: 16.0   Smokeless tobacco: Never  Vaping Use   Vaping Use: Never used  Substance and Sexual Activity   Alcohol use: No    Alcohol/week: 0.0 standard drinks of alcohol   Drug use: No   Sexual activity: Not on file  Other Topics Concern   Not on file  Social History Narrative   Not on file   Social Determinants of Health   Financial Resource Strain: Not on file  Food Insecurity: Not on file  Transportation Needs: No Transportation Needs (04/13/2020)   PRAPARE - Hydrologist (Medical): No    Lack of Transportation (Non-Medical): No  Physical Activity: Not on file  Stress: Not on file  Social Connections: Not on file     Family History: The patient's family history includes Atrial fibrillation in his father; Breast cancer in his maternal grandmother and mother; CAD in  his father; Heart disease in his father and paternal uncle; Stomach  cancer in his paternal aunt. ROS:   Please see the history of present illness.    All other systems reviewed and are negative.  EKGs/Labs/Other Studies Reviewed:    The following studies were reviewed today:  Cardiac Studies & Procedures       ECHOCARDIOGRAM  ECHOCARDIOGRAM LIMITED 07/31/2021  Narrative ECHOCARDIOGRAM LIMITED REPORT    Patient Name:   David Fitzgerald Date of Exam: 07/31/2021 Medical Rec #:  [id number]      Height:       74.0 in Accession #:    [id number]     Weight:       378.0 lb Date of Birth:  10-Sep-1960     BSA:          2.849 m Patient Age:    65 years       BP:           120/90 mmHg Patient Gender: M              HR:           113 bpm. Exam Location:  [facility]  Procedure: Intracardiac Opacification Agent, Limited Echo and Color Doppler  Indications:    Hypertension, unspecified type [I10 (ICD-10-CM)]  History:        Patient has prior history of Echocardiogram examinations, most recent 06/21/2021. CHF, Arrythmias:Atrial Fibrillation; Risk Factors:Hypertension and Dyslipidemia.  Sonographer:    Philipp Deputy RDCS Referring Phys: [id number] Richardo Priest   Sonographer Comments: Image acquisition challenging due to patient body habitus and Image acquisition challenging due to respiratory motion. IMPRESSIONS   1. Left ventricular ejection fraction, by estimation, appears to be 40-45%. Contrast was utilized howere in view of A fib, IVCD and suboptimal nature of the study, this assesment is suboptimal. May need other modalities of imaging. This was a limited study.  FINDINGS Left Ventricle: Left ventricular ejection fraction, by estimation, is 60 to 65%. The left ventricle has normal function. The left ventricle has no regional wall motion abnormalities. Definity contrast agent was given IV to delineate the left ventricular endocardial borders. The left ventricular internal cavity size  was normal in size. There is no left ventricular hypertrophy.   Jyl Heinz MD Electronically signed by Jyl Heinz MD Signature Date/Time: 07/31/2021/5:36:34 PM    Final    MONITORS  LONG TERM MONITOR (3-14 DAYS) 10/25/2019  Narrative Is a monitor was performed for 7 days beginning 09/28/2019 to assess heart rate response with chronic atrial fibrillation.  Daytime his heart rate profile was 50-110 86% of the time, 2% 1 10-1 50 and less than 1% greater than 150 bpm.  Nighttime his heart rate profile was 50-110 87% of the time, 110 to 152% of the time and greater than 150 less than 1% of the time.  No pauses of 3 seconds or greater.  Regular ectopy was rare with isolated PVCs 2 couplets and one triplet.  Acute event was seen with atrial fibrillation.   Occlusion atrial fibrillation, heart rate control is good.             Recent Labs: 11/01/2021: BUN 21; Creatinine, Ser 1.57; NT-Pro BNP 247; Potassium 4.8; Sodium 139  Recent Lipid Panel    Component Value Date/Time   CHOL 156 12/03/2018 1502   TRIG 301 (H) 12/03/2018 1502   HDL 29 (L) 12/03/2018 1502   CHOLHDL 5.4 (H) 12/03/2018 1502   LDLCALC 78 12/03/2018 1502    Physical Exam:    VS:  BP (!) 72/59 (  BP Location: Left Wrist)   Pulse 64   Ht 6\' 2"  (1.88 m)   Wt (!) 359 lb (162.8 kg) Comment: Pt has left prosthetic = 5lbs  SpO2 94%   BMI 46.09 kg/m     Wt Readings from Last 3 Encounters:  05/01/22 (!) 359 lb (162.8 kg)  01/25/22 (!) 357 lb (161.9 kg)  11/01/21 (!) 396 lb (179.6 kg)     GEN: Does not look acutely ill or pale well nourished, well developed in no acute distress HEENT: Normal NECK: No JVD; No carotid bruits LYMPHATICS: No lymphadenopathy CARDIAC: Irregular rate and rhythm variable first heart sound  RESPIRATORY:  Clear to auscultation without rales, wheezing or rhonchi  ABDOMEN: Soft, non-tender, non-distended MUSCULOSKELETAL:  No edema; No deformity  SKIN: Warm and dry NEUROLOGIC:   Alert and oriented x 3 PSYCHIATRIC:  Normal affect    Signed, Shirlee More, MD  05/01/2022 4:20 PM    [facility]

## 2022-04-30 DIAGNOSIS — G4733 Obstructive sleep apnea (adult) (pediatric): Secondary | ICD-10-CM | POA: Diagnosis not present

## 2022-04-30 DIAGNOSIS — R5383 Other fatigue: Secondary | ICD-10-CM | POA: Diagnosis not present

## 2022-04-30 DIAGNOSIS — R400 Somnolence: Secondary | ICD-10-CM | POA: Diagnosis not present

## 2022-04-30 DIAGNOSIS — J4520 Mild intermittent asthma, uncomplicated: Secondary | ICD-10-CM | POA: Diagnosis not present

## 2022-05-01 ENCOUNTER — Encounter: Payer: Self-pay | Admitting: Cardiology

## 2022-05-01 ENCOUNTER — Ambulatory Visit: Payer: Medicare HMO | Attending: Cardiology | Admitting: Cardiology

## 2022-05-01 VITALS — BP 72/59 | HR 64 | Ht 74.0 in | Wt 359.0 lb

## 2022-05-01 DIAGNOSIS — I119 Hypertensive heart disease without heart failure: Secondary | ICD-10-CM | POA: Diagnosis not present

## 2022-05-01 DIAGNOSIS — N179 Acute kidney failure, unspecified: Secondary | ICD-10-CM | POA: Diagnosis not present

## 2022-05-01 DIAGNOSIS — Z7901 Long term (current) use of anticoagulants: Secondary | ICD-10-CM | POA: Diagnosis not present

## 2022-05-01 DIAGNOSIS — I959 Hypotension, unspecified: Secondary | ICD-10-CM | POA: Diagnosis not present

## 2022-05-01 DIAGNOSIS — I4821 Permanent atrial fibrillation: Secondary | ICD-10-CM

## 2022-05-01 NOTE — Addendum Note (Signed)
Addended by: Edwyna Shell I on: 05/01/2022 04:44 PM   Modules accepted: Orders

## 2022-05-01 NOTE — Patient Instructions (Signed)
Medication Instructions:  Your physician has recommended you make the following change in your medication:   STOP: Valsartan STOP: Torsemide STOP: Spirinolactone STOP: Diltiazem  *If you need a refill on your cardiac medications before your next appointment, please call your pharmacy*   Lab Work: None If you have labs (blood work) drawn today and your tests are completely normal, you will receive your results only by: [place] (if you have MyChart) OR A paper copy in the mail If you have any lab test that is abnormal or we need to change your treatment, we will call you to review the results.   Testing/Procedures: None   Follow-Up: At [facility], you and your health needs are our priority.  As part of our continuing mission to provide you with exceptional heart care, we have created designated Provider Care Teams.  These Care Teams include your primary Cardiologist (physician) and Advanced Practice Providers (APPs -  Physician Assistants and Nurse Practitioners) who all work together to provide you with the care you need, when you need it.  We recommend signing up for the patient portal called "MyChart".  Sign up information is provided on this After Visit Summary.  MyChart is used to connect with patients for Virtual Visits (Telemedicine).  Patients are able to view lab/test results, encounter notes, upcoming appointments, etc.  Non-urgent messages can be sent to your provider as well.   To learn more about what you can do with MyChart, go to [email or web address].    Your next appointment:   6 week(s)  Provider:   Shirlee More, MD    Other Instructions Take blood pressures and heart rates daily and record. Drop off list of blood pressures and heart rates by the office on Monday.

## 2022-05-02 DIAGNOSIS — E1122 Type 2 diabetes mellitus with diabetic chronic kidney disease: Secondary | ICD-10-CM | POA: Diagnosis not present

## 2022-05-02 DIAGNOSIS — I129 Hypertensive chronic kidney disease with stage 1 through stage 4 chronic kidney disease, or unspecified chronic kidney disease: Secondary | ICD-10-CM | POA: Diagnosis not present

## 2022-05-02 DIAGNOSIS — N2889 Other specified disorders of kidney and ureter: Secondary | ICD-10-CM | POA: Diagnosis not present

## 2022-05-02 DIAGNOSIS — N184 Chronic kidney disease, stage 4 (severe): Secondary | ICD-10-CM | POA: Diagnosis not present

## 2022-05-05 DIAGNOSIS — I480 Paroxysmal atrial fibrillation: Secondary | ICD-10-CM | POA: Diagnosis not present

## 2022-05-05 DIAGNOSIS — I10 Essential (primary) hypertension: Secondary | ICD-10-CM | POA: Diagnosis not present

## 2022-05-05 DIAGNOSIS — E1165 Type 2 diabetes mellitus with hyperglycemia: Secondary | ICD-10-CM | POA: Diagnosis not present

## 2022-05-05 DIAGNOSIS — E7800 Pure hypercholesterolemia, unspecified: Secondary | ICD-10-CM | POA: Diagnosis not present

## 2022-05-07 ENCOUNTER — Telehealth: Payer: Self-pay

## 2022-05-07 DIAGNOSIS — I4819 Other persistent atrial fibrillation: Secondary | ICD-10-CM

## 2022-05-07 MED ORDER — APIXABAN 5 MG PO TABS: 5.0000 mg | ORAL_TABLET | Freq: Two times a day (BID) | ORAL | 1 refills | Status: DC

## 2022-05-07 NOTE — Telephone Encounter (Signed)
Prescription refill request for Eliquis received. Indication:Afib  Last office visit: 05/01/22 [facility])  Scr: 3.1 (04/19/22)  Age: 62 Weight: 162.8kg  Appropriate dose. Refill sent.

## 2022-05-07 NOTE — Telephone Encounter (Signed)
Patient dropped off past week of home blood pressure reading. Placed in Dr Aaron [facility] folder to review. Patient also requesting refills for Eliquis 5 mg, will forward request to anticoag clinic

## 2022-05-07 NOTE — Addendum Note (Signed)
Addended by: Leonidas Romberg on: 05/07/2022 01:11 PM   Modules accepted: Orders

## 2022-05-10 DIAGNOSIS — N184 Chronic kidney disease, stage 4 (severe): Secondary | ICD-10-CM | POA: Diagnosis not present

## 2022-05-22 DIAGNOSIS — I130 Hypertensive heart and chronic kidney disease with heart failure and stage 1 through stage 4 chronic kidney disease, or unspecified chronic kidney disease: Secondary | ICD-10-CM | POA: Diagnosis not present

## 2022-05-22 DIAGNOSIS — G5622 Lesion of ulnar nerve, left upper limb: Secondary | ICD-10-CM | POA: Diagnosis not present

## 2022-05-22 DIAGNOSIS — I10 Essential (primary) hypertension: Secondary | ICD-10-CM | POA: Diagnosis not present

## 2022-05-22 DIAGNOSIS — X58XXXA Exposure to other specified factors, initial encounter: Secondary | ICD-10-CM | POA: Diagnosis not present

## 2022-05-22 DIAGNOSIS — I4821 Permanent atrial fibrillation: Secondary | ICD-10-CM | POA: Diagnosis not present

## 2022-05-22 DIAGNOSIS — S53115S Anterior dislocation of left ulnohumeral joint, sequela: Secondary | ICD-10-CM | POA: Diagnosis not present

## 2022-05-22 DIAGNOSIS — I509 Heart failure, unspecified: Secondary | ICD-10-CM | POA: Diagnosis not present

## 2022-05-22 DIAGNOSIS — E1122 Type 2 diabetes mellitus with diabetic chronic kidney disease: Secondary | ICD-10-CM | POA: Diagnosis not present

## 2022-05-22 DIAGNOSIS — M19022 Primary osteoarthritis, left elbow: Secondary | ICD-10-CM | POA: Diagnosis not present

## 2022-05-22 DIAGNOSIS — G5602 Carpal tunnel syndrome, left upper limb: Secondary | ICD-10-CM | POA: Diagnosis not present

## 2022-05-22 DIAGNOSIS — Z9889 Other specified postprocedural states: Secondary | ICD-10-CM | POA: Diagnosis not present

## 2022-05-22 DIAGNOSIS — G8918 Other acute postprocedural pain: Secondary | ICD-10-CM | POA: Diagnosis not present

## 2022-05-22 DIAGNOSIS — S53115D Anterior dislocation of left ulnohumeral joint, subsequent encounter: Secondary | ICD-10-CM | POA: Diagnosis not present

## 2022-05-22 DIAGNOSIS — M25322 Other instability, left elbow: Secondary | ICD-10-CM | POA: Diagnosis not present

## 2022-05-29 DIAGNOSIS — M5136 Other intervertebral disc degeneration, lumbar region: Secondary | ICD-10-CM | POA: Diagnosis not present

## 2022-05-29 DIAGNOSIS — Z1389 Encounter for screening for other disorder: Secondary | ICD-10-CM | POA: Diagnosis not present

## 2022-05-29 DIAGNOSIS — Z79899 Other long term (current) drug therapy: Secondary | ICD-10-CM | POA: Diagnosis not present

## 2022-05-29 DIAGNOSIS — M1612 Unilateral primary osteoarthritis, left hip: Secondary | ICD-10-CM | POA: Diagnosis not present

## 2022-05-29 DIAGNOSIS — M25552 Pain in left hip: Secondary | ICD-10-CM | POA: Diagnosis not present

## 2022-05-29 DIAGNOSIS — Z79891 Long term (current) use of opiate analgesic: Secondary | ICD-10-CM | POA: Diagnosis not present

## 2022-05-29 DIAGNOSIS — M47816 Spondylosis without myelopathy or radiculopathy, lumbar region: Secondary | ICD-10-CM | POA: Diagnosis not present

## 2022-05-29 DIAGNOSIS — G894 Chronic pain syndrome: Secondary | ICD-10-CM | POA: Diagnosis not present

## 2022-05-31 DIAGNOSIS — G4733 Obstructive sleep apnea (adult) (pediatric): Secondary | ICD-10-CM | POA: Diagnosis not present

## 2022-06-05 DIAGNOSIS — W050XXA Fall from non-moving wheelchair, initial encounter: Secondary | ICD-10-CM | POA: Diagnosis not present

## 2022-06-05 DIAGNOSIS — S53115A Anterior dislocation of left ulnohumeral joint, initial encounter: Secondary | ICD-10-CM | POA: Diagnosis not present

## 2022-06-05 DIAGNOSIS — Z4789 Encounter for other orthopedic aftercare: Secondary | ICD-10-CM | POA: Diagnosis not present

## 2022-06-06 NOTE — Progress Notes (Signed)
Cardiology Office Note:    Date:  06/07/2022   ID:  David Fitzgerald, DOB [birth year], MRN [id number]  PCP:  Noni Saupe, MD  Cardiologist:  Norman Herrlich, MD    Referring MD: Noni Saupe, MD    ASSESSMENT:    1. Hypotension, unspecified hypotension type   2. Hypertensive heart disease without heart failure   3. Persistent atrial fibrillation (HCC)   4. Chronic anticoagulation    PLAN:    In order of problems listed above:  Resolved with improved kidney function Currently taking no loop diuretic I am concerned with shortness of breath will need to restart and gave him a prescription for furosemide and guidelines for when to take.  The only other blood pressure medicine he takes is metoprolol. Controlled continue his beta-blocker and anticoagulant   Next appointment: 3 months   Medication Adjustments/Labs and Tests Ordered: Current medicines are reviewed at length with the patient today.  Concerns regarding medicines are outlined above.  No orders of the defined types were placed in this encounter.  Meds ordered this encounter  Medications   furosemide (LASIX) 40 MG tablet    Sig: Take 1 tablet (40 mg total) by mouth daily as needed for fluid or edema. If there is a 5 lb increase.    Dispense:  45 tablet    Refill:  3    Chief complaint follow-up after visit March with hypotension and acute decompensation and kidney function   History of Present Illness:    David Fitzgerald is a 62 y.o. male with a hx of chronic atrial fibrillation with anticoagulation hypertensive heart disease with heart failure and mildly reduced ejection fraction 40 to 45% last seen 05/01/2022 with symptomatic hypotension and acute kidney injury GFR 22 cc/min potassium 3.1.  An echocardiogram at [facility] in June with an EF of 45%.   He had a renal profile 05/10/2022 improved creatinine 1.46 GFR 54 cc sodium 136 potassium 4.2 He has followed up with nephrology and has an  upcoming visit He is to get known diuretic he is not having edema his weight is stable at home he said he recently had a respiratory infection since that he finds himself breathless with activities but no wheezing he is not having orthopnea.  I gave him a prescription for furosemide 40 mg daily if his weight goes up 5 pounds from baseline at home He is seeing pulmonary Monday I asked his mother to request a chest x-ray to be performed Compliance with diet, lifestyle and medications: Yes Past Medical History:  Diagnosis Date   A-fib (HCC)    Abscess of ankle    Abscess of bursa of left ankle 10/04/2020   Allergy to pollen 04/06/2015   Ankle fracture 01/31/2020   Anxiety 02/06/2017   Arthritis 02/06/2017   Asthma    as a child   Atrial fibrillation with rapid ventricular response (HCC) 12/08/2020   Bee sting allergy    wasp / mixed vespid   Bell's palsy 02/24/2013   BMI 45.0-49.9, adult (HCC) 07/09/2012   CAD in native artery 02/07/2017   CHF (congestive heart failure) (HCC)    Chronic anticoagulation 03/07/2017   Chronic pain syndrome 07/09/2012   Chronic venous insufficiency 12/14/2014   Chronic, continuous use of opioids 02/15/2020   Coronary artery calcification seen on CT scan 02/07/2017   Depression    Diabetes (HCC)    Dyspnea on minimal exertion 12/08/2020   Dysrhythmia    afib  Edema 12/14/2014   Epigastric pain 12/08/2020   GERD (gastroesophageal reflux disease)    Hematuria 12/08/2020   History of kidney stones    Hyperlipidemia, mixed 12/14/2014   Hypertension    Hypertensive heart disease 12/14/2014   Hypotension 12/25/2017   IBS (irritable bowel syndrome)    Instability of left elbow joint 02/22/2021   Left ankle pain 04/05/2020   Lumbar pain with radiation down left leg 07/09/2012   Morbid obesity (HCC) 12/14/2014   Morbid obesity with BMI of 40.0-44.9, adult (HCC) 12/14/2014   Morbid obesity with BMI of 45.0-49.9, adult (HCC) 07/09/2012   Nausea &  vomiting 12/08/2020   Obesity hypoventilation syndrome (HCC) 12/14/2014   Open dislocation of left ankle    Open fracture dislocation of left ankle 01/31/2020   OSA (obstructive sleep apnea) 03/09/2020   doesn't use a Cpap   OSA treated with BiPAP 12/14/2014   Pain syndrome, chronic 07/09/2012   Permanent atrial fibrillation (HCC)    Pernicious anemia 02/06/2017   Persistent atrial fibrillation (HCC) 02/06/2017   CHADS2vasc of 2   Pneumonia    Polyneuropathy in diabetes (HCC) 02/24/2013   Subacute osteomyelitis, left ankle and foot (HCC)    Syndrome affecting cervical region 02/24/2013   Trigeminal neuralgia 02/24/2013   Type 2 diabetes mellitus with diabetic neuropathy (HCC) 12/14/2014   Type II diabetes mellitus, uncontrolled 12/08/2020   Vitamin D deficiency 02/16/2020   Wound dehiscence, traumatic injury repair, left medial ankle  03/08/2020    Past Surgical History:  Procedure Laterality Date    INTERNAL FIXATION LEFT TIBIA TO CALCANEUS AND APPLY SKIN GRAFT (Left Ankle)  04/05/2020   ADENOIDECTOMY     AMPUTATION Left 10/04/2020   Procedure: LEFT BELOW KNEE AMPUTATION;  Surgeon: Nadara Mustard, MD;  Location: [facility] OR;  Service: Orthopedics;  Laterality: Left;   BACK SURGERY  2002 /2003   CARPAL TUNNEL RELEASE Bilateral    ELBOW LIGAMENT RECONSTRUCTION Left 03/02/2021   Procedure: LEFT ELBOW LATERAL ULNAR COLLATERAL LIGAMENT REPAIR/ POSSIBLE MEDIAL LIGAMENT REPAIR;  Surgeon: Marlyne Beards, MD;  Location: [facility] OR;  Service: Orthopedics;  Laterality: Left;   I & D EXTREMITY Left 01/31/2020   Procedure: IRRIGATION AND DEBRIDEMENT ANKLE;  Surgeon: Myrene Galas, MD;  Location: [facility] OR;  Service: Orthopedics;  Laterality: Left;   I & D EXTREMITY Left 03/10/2020   Procedure: DEBRIDEMENT LEFT ANKLE, APPLY SKIN GRAFT;  Surgeon: Nadara Mustard, MD;  Location: [facility] OR;  Service: Orthopedics;  Laterality: Left;   KNEE SURGERY Left 2005   ORIF ANKLE FRACTURE Left 01/31/2020   ORIF ANKLE  FRACTURE Left 01/31/2020   Procedure: OPEN REDUCTION INTERNAL FIXATION (ORIF) ANKLE FRACTURE;  Surgeon: Myrene Galas, MD;  Location: [facility] OR;  Service: Orthopedics;  Laterality: Left;   ORIF ANKLE FRACTURE Left 04/05/2020   Procedure: INTERNAL FIXATION LEFT TIBIA TO CALCANEUS AND APPLY SKIN GRAFT;  Surgeon: Nadara Mustard, MD;  Location: [facility] OR;  Service: Orthopedics;  Laterality: Left;   SHOULDER SURGERY Left 1998   TONSILLECTOMY      Current Medications: Current Meds  Medication Sig   apixaban (ELIQUIS) 5 MG TABS tablet Take 1 tablet (5 mg total) by mouth 2 (two) times daily.   ascorbic acid (VITAMIN C) 1000 MG tablet Take 1,000 mg by mouth daily.   Cholecalciferol (VITAMIN D3) 125 MCG (5000 UT) TABS Take 5,000 Units by mouth daily.   Cyanocobalamin (B-12) 2500 MCG TABS Take 2,500 mcg by mouth daily.   DULoxetine (CYMBALTA) 30 MG  capsule Take 30 mg by mouth at bedtime.   DULoxetine (CYMBALTA) 60 MG capsule Take 60 mg by mouth every morning.   EPINEPHrine 0.3 mg/0.3 mL IJ SOAJ injection Inject 0.3 mg into the muscle as needed for anaphylaxis.   furosemide (LASIX) 40 MG tablet Take 1 tablet (40 mg total) by mouth daily as needed for fluid or edema. If there is a 5 lb increase.   gabapentin (NEURONTIN) 600 MG tablet Take 300 mg by mouth 2 (two) times daily.   Lactobacillus (PROBIOTIC GOLD EXTRA STRENGTH) CAPS Take 1 tablet by mouth daily.   metoprolol succinate (TOPROL-XL) 50 MG 24 hr tablet Take 3 tablets (150 mg total) by mouth daily. (Patient taking differently: Take 50 mg by mouth daily.)   naloxone (NARCAN) nasal spray 4 mg/0.1 mL Place 1 spray into the nose as needed for opioid reversal.   nitroGLYCERIN (NITROSTAT) 0.4 MG SL tablet Place 1 tablet (0.4 mg total) under the tongue every 5 (five) minutes as needed for chest pain.   pantoprazole (PROTONIX) 40 MG tablet Take 40 mg by mouth daily.   simvastatin (ZOCOR) 20 MG tablet Take 20 mg by mouth at bedtime.   tamsulosin (FLOMAX) 0.4 MG  CAPS capsule Take 0.4 mg by mouth daily.     Allergies:   Bee venom, Penicillins, and Aspirin   Social History   Socioeconomic History   Marital status: Married    Spouse name: Not on file   Number of children: Not on file   Years of education: Not on file   Highest education level: Not on file  Occupational History   Not on file  Tobacco Use   Smoking status: Former    Packs/day: 3.00    Years: 35.00    Additional pack years: 0.00    Total pack years: 105.00    Types: Cigarettes    Quit date: 04/13/2006    Years since quitting: 16.1   Smokeless tobacco: Never  Vaping Use   Vaping Use: Never used  Substance and Sexual Activity   Alcohol use: No    Alcohol/week: 0.0 standard drinks of alcohol   Drug use: No   Sexual activity: Not on file  Other Topics Concern   Not on file  Social History Narrative   Not on file   Social Determinants of Health   Financial Resource Strain: Not on file  Food Insecurity: Not on file  Transportation Needs: No Transportation Needs (04/13/2020)   PRAPARE - Administrator, Civil Service (Medical): No    Lack of Transportation (Non-Medical): No  Physical Activity: Not on file  Stress: Not on file  Social Connections: Not on file     Family History: The patient's family history includes Atrial fibrillation in his father; Breast cancer in his maternal grandmother and mother; CAD in his father; Heart disease in his father and paternal uncle; Stomach cancer in his paternal aunt. ROS:   Please see the history of present illness.    All other systems reviewed and are negative.  EKGs/Labs/Other Studies Reviewed:    The following studies were reviewed today:  Cardiac Studies & Procedures       ECHOCARDIOGRAM  ECHOCARDIOGRAM LIMITED 07/31/2021  Narrative ECHOCARDIOGRAM LIMITED REPORT    Patient Name:   SAVIOR BASNIGHT Date of Exam: 07/31/2021 Medical Rec #:  [id number]      Height:       74.0 in Accession #:    [id number]      Weight:  378.0 lb Date of Birth:  07-Dec-1960     BSA:          2.849 m Patient Age:    60 years       BP:           120/90 mmHg Patient Gender: M              HR:           113 bpm. Exam Location:  [facility]  Procedure: Intracardiac Opacification Agent, Limited Echo and Color Doppler  Indications:    Hypertension, unspecified type [I10 (ICD-10-CM)]  History:        Patient has prior history of Echocardiogram examinations, most recent 06/21/2021. CHF, Arrythmias:Atrial Fibrillation; Risk Factors:Hypertension and Dyslipidemia.  Sonographer:    Margreta Journey RDCS Referring Phys: [id number] Baldo Daub   Sonographer Comments: Image acquisition challenging due to patient body habitus and Image acquisition challenging due to respiratory motion. IMPRESSIONS   1. Left ventricular ejection fraction, by estimation, appears to be 40-45%. Contrast was utilized howere in view of A fib, IVCD and suboptimal nature of the study, this assesment is suboptimal. May need other modalities of imaging. This was a limited study.  FINDINGS Left Ventricle: Left ventricular ejection fraction, by estimation, is 60 to 65%. The left ventricle has normal function. The left ventricle has no regional wall motion abnormalities. Definity contrast agent was given IV to delineate the left ventricular endocardial borders. The left ventricular internal cavity size was normal in size. There is no left ventricular hypertrophy.   Belva Crome MD Electronically signed by Belva Crome MD Signature Date/Time: 07/31/2021/5:36:34 PM    Final    MONITORS  LONG TERM MONITOR (3-14 DAYS) 10/25/2019  Narrative Is a monitor was performed for 7 days beginning 09/28/2019 to assess heart rate response with chronic atrial fibrillation.  Daytime his heart rate profile was 50-110 86% of the time, 2% 1 10-1 50 and less than 1% greater than 150 bpm.  Nighttime his heart rate profile was 50-110 87% of the time, 110 to  152% of the time and greater than 150 less than 1% of the time.  No pauses of 3 seconds or greater.  Regular ectopy was rare with isolated PVCs 2 couplets and one triplet.  Acute event was seen with atrial fibrillation.   Occlusion atrial fibrillation, heart rate control is good.             Recent Labs: 11/01/2021: BUN 21; Creatinine, Ser 1.57; NT-Pro BNP 247; Potassium 4.8; Sodium 139  Recent Lipid Panel    Component Value Date/Time   CHOL 156 12/03/2018 1502   TRIG 301 (H) 12/03/2018 1502   HDL 29 (L) 12/03/2018 1502   CHOLHDL 5.4 (H) 12/03/2018 1502   LDLCALC 78 12/03/2018 1502    Physical Exam:    VS:  BP (!) 150/86 (BP Location: Right Arm, Patient Position: Sitting, Cuff Size: Large)   Pulse 76   Ht 6\' 2"  (1.88 m)   Wt (!) 359 lb (162.8 kg)   SpO2 96%   BMI 46.09 kg/m     Wt Readings from Last 3 Encounters:  06/07/22 (!) 359 lb (162.8 kg)  05/01/22 (!) 359 lb (162.8 kg)  01/25/22 (!) 357 lb (161.9 kg)     GEN: Obese BMI 46 well nourished, well developed in no acute distress HEENT: Normal NECK: No JVD; No carotid bruits LYMPHATICS: No lymphadenopathy CARDIAC: RRR, no murmurs, rubs, gallops RESPIRATORY:  Clear to auscultation without rales,  wheezing or rhonchi  ABDOMEN: Soft, non-tender, non-distended MUSCULOSKELETAL:  No edema; No deformity  SKIN: Warm and dry NEUROLOGIC:  Alert and oriented x 3 PSYCHIATRIC:  Normal affect    Signed, Norman Herrlich, MD  06/07/2022 1:59 PM    [facility]

## 2022-06-07 ENCOUNTER — Encounter: Payer: Self-pay | Admitting: Cardiology

## 2022-06-07 ENCOUNTER — Ambulatory Visit: Payer: Medicare HMO | Attending: Cardiology | Admitting: Cardiology

## 2022-06-07 VITALS — BP 150/86 | HR 76 | Ht 74.0 in | Wt 359.0 lb

## 2022-06-07 DIAGNOSIS — I959 Hypotension, unspecified: Secondary | ICD-10-CM

## 2022-06-07 DIAGNOSIS — I4819 Other persistent atrial fibrillation: Secondary | ICD-10-CM | POA: Diagnosis not present

## 2022-06-07 DIAGNOSIS — I119 Hypertensive heart disease without heart failure: Secondary | ICD-10-CM

## 2022-06-07 DIAGNOSIS — Z7901 Long term (current) use of anticoagulants: Secondary | ICD-10-CM

## 2022-06-07 MED ORDER — FUROSEMIDE 40 MG PO TABS: 40.0000 mg | ORAL_TABLET | Freq: Every day | ORAL | 3 refills | Status: DC | PRN

## 2022-06-07 NOTE — Patient Instructions (Signed)
Medication Instructions:  Your physician has recommended you make the following change in your medication:   START: Lasix 40 mg daily as needed if 5 lb. Increase in weight  *If you need a refill on your cardiac medications before your next appointment, please call your pharmacy*   Lab Work: None If you have labs (blood work) drawn today and your tests are completely normal, you will receive your results only by: MyChart Message (if you have MyChart) OR A paper copy in the mail If you have any lab test that is abnormal or we need to change your treatment, we will call you to review the results.   Testing/Procedures: None   Follow-Up: At [facility], you and your health needs are our priority.  As part of our continuing mission to provide you with exceptional heart care, we have created designated Provider Care Teams.  These Care Teams include your primary Cardiologist (physician) and Advanced Practice Providers (APPs -  Physician Assistants and Nurse Practitioners) who all work together to provide you with the care you need, when you need it.  We recommend signing up for the patient portal called "MyChart".  Sign up information is provided on this After Visit Summary.  MyChart is used to connect with patients for Virtual Visits (Telemedicine).  Patients are able to view lab/test results, encounter notes, upcoming appointments, etc.  Non-urgent messages can be sent to your provider as well.   To learn more about what you can do with MyChart, go to [email or web address].    Your next appointment:   3 month(s)  Provider:   Norman Herrlich, MD    Other Instructions None

## 2022-06-10 ENCOUNTER — Telehealth: Payer: Self-pay

## 2022-06-10 DIAGNOSIS — R400 Somnolence: Secondary | ICD-10-CM | POA: Diagnosis not present

## 2022-06-10 DIAGNOSIS — G4733 Obstructive sleep apnea (adult) (pediatric): Secondary | ICD-10-CM | POA: Diagnosis not present

## 2022-06-10 DIAGNOSIS — J4520 Mild intermittent asthma, uncomplicated: Secondary | ICD-10-CM | POA: Diagnosis not present

## 2022-06-10 DIAGNOSIS — R5383 Other fatigue: Secondary | ICD-10-CM | POA: Diagnosis not present

## 2022-06-10 NOTE — Telephone Encounter (Signed)
   Pre-operative Risk Assessment    Patient Name: MARVELOUS GARVEN  DOB: 30-Jun-1960 MRN: [id number]      Request for Surgical Clearance    Procedure:   L Elbow Internal Stabilization Adjustment  Date of Surgery:  Clearance 06/13/2022                                 Surgeon:  Karle Plumber, MD Surgeon's Group or Practice Name:  [facility] Phone number:  [phone] Fax number:  [phone]   Type of Clearance Requested:   - Medical  Medical and Pharmacy   Type of Anesthesia:  Not Indicated   Additional requests/questions:  Please fax a copy of signed cardiac clearance form  to the surgeon's office.  Signed, Viviano Simas   06/10/2022, 12:56 PM

## 2022-06-11 ENCOUNTER — Telehealth: Payer: Self-pay | Admitting: Cardiology

## 2022-06-11 NOTE — Telephone Encounter (Signed)
   Name: David Fitzgerald  DOB: 10/22/1960  MRN: [id number]   Primary Cardiologist: Norman Herrlich, MD  Chart reviewed as part of pre-operative protocol coverage. Patient was contacted 06/11/2022 in reference to pre-operative risk assessment for pending surgery as outlined below.  David Fitzgerald was last seen on 06/06/21 by Dr. Dulce Sellar.  Since that day, David Fitzgerald has done stable. He continues to feel breathless, however was evaluated by pulmonology on 5/6 as well and was stable.   Therefore, based on ACC/AHA guidelines, the patient would be at acceptable risk for the planned procedure without further cardiovascular testing. He is able to achieve 4.4 METs.   The patient was advised that if he develops new symptoms prior to surgery to contact our office to arrange for a follow-up visit, and he verbalized understanding.  I will route this recommendation to the requesting party via Epic fax function and remove from pre-op pool. Please call with questions.  Flossie Dibble, NP 06/11/2022, 11:49 AM

## 2022-06-11 NOTE — Telephone Encounter (Signed)
Patient with diagnosis of afib on Eliquis for anticoagulation.    Procedure: L Elbow Internal Stabilization Adjustment  Date of procedure: 06/13/22  CHA2DS2-VASc Score = 4  This indicates a 4.8% annual risk of stroke. The patient's score is based upon: CHF History: 1 HTN History: 1 Diabetes History: 1 Stroke History: 0 Vascular Disease History: 1 Age Score: 0 Gender Score: 0   CrCl 77mL/min using adj body weight Platelet count 350K  Per office protocol, patient can hold Eliquis for 2-3 days prior to procedure.    **This guidance is not considered finalized until pre-operative APP has relayed final recommendations.**

## 2022-06-11 NOTE — Telephone Encounter (Signed)
Dr. Dulce Sellar,   You saw this patient on 06/07/2022. Will you please comment on medical clearance for left elbow internal stabilization and adjustment?  Per Pharm.D. recommendation to hold Eliquis 2 to 3 days prior to procedure.  Please route your response to P CV DIV Preop. I will communicate with requesting office once you have given recommendations.   Thank you!  Carlos Levering, NP

## 2022-06-11 NOTE — Telephone Encounter (Signed)
   Name: David Fitzgerald  DOB: [birth year]  MRN: [id number]   Primary Cardiologist: Norman Herrlich, MD  Chart reviewed as part of pre-operative protocol coverage. MIGUELITO SCHNAIBLE was last seen on 06/07/2022 by Dr. Dulce Sellar.  Patient also spoke with Wallis Bamberg, NP: "Thurston Hole has done stable. He continues to feel breathless, however was evaluated by pulmonology on 5/6 as well and was stable."    Therefore, based on ACC/AHA guidelines, the patient would be at acceptable risk for the planned procedure without further cardiovascular testing. He is able to achieve 4.4 METs.    The patient was advised that if he develops new symptoms prior to surgery to contact our office to arrange for a follow-up visit, and he verbalized understanding.  Per pharm D: Patient with diagnosis of afib on Eliquis for anticoagulation.     Procedure: L Elbow Internal Stabilization Adjustment  Date of procedure: 06/13/22   CHA2DS2-VASc Score = 4  This indicates a 4.8% annual risk of stroke. The patient's score is based upon: CHF History: 1 HTN History: 1 Diabetes History: 1 Stroke History: 0 Vascular Disease History: 1 Age Score: 0 Gender Score: 0   CrCl 56mL/min using adj body weight Platelet count 350K   Per office protocol, patient can hold Eliquis for 2-3 days prior to procedure.      I will route this recommendation to the requesting party via Epic fax function and remove from pre-op pool. Please call with questions.  Carlos Levering, NP 06/11/2022, 12:02 PM

## 2022-06-13 ENCOUNTER — Ambulatory Visit: Payer: Medicare HMO

## 2022-06-13 DIAGNOSIS — Y33XXXA Other specified events, undetermined intent, initial encounter: Secondary | ICD-10-CM | POA: Diagnosis not present

## 2022-06-13 DIAGNOSIS — M24422 Recurrent dislocation, left elbow: Secondary | ICD-10-CM | POA: Diagnosis not present

## 2022-06-13 DIAGNOSIS — E1151 Type 2 diabetes mellitus with diabetic peripheral angiopathy without gangrene: Secondary | ICD-10-CM | POA: Diagnosis not present

## 2022-06-13 DIAGNOSIS — T84018A Broken internal joint prosthesis, other site, initial encounter: Secondary | ICD-10-CM | POA: Diagnosis not present

## 2022-06-13 DIAGNOSIS — Y33XXXD Other specified events, undetermined intent, subsequent encounter: Secondary | ICD-10-CM | POA: Diagnosis not present

## 2022-06-13 DIAGNOSIS — Y831 Surgical operation with implant of artificial internal device as the cause of abnormal reaction of the patient, or of later complication, without mention of misadventure at the time of the procedure: Secondary | ICD-10-CM | POA: Diagnosis not present

## 2022-06-13 DIAGNOSIS — I5022 Chronic systolic (congestive) heart failure: Secondary | ICD-10-CM | POA: Diagnosis not present

## 2022-06-13 DIAGNOSIS — E1122 Type 2 diabetes mellitus with diabetic chronic kidney disease: Secondary | ICD-10-CM | POA: Diagnosis not present

## 2022-06-13 DIAGNOSIS — S53125D Posterior dislocation of left ulnohumeral joint, subsequent encounter: Secondary | ICD-10-CM | POA: Diagnosis not present

## 2022-06-13 DIAGNOSIS — S53125A Posterior dislocation of left ulnohumeral joint, initial encounter: Secondary | ICD-10-CM | POA: Diagnosis not present

## 2022-06-13 DIAGNOSIS — I10 Essential (primary) hypertension: Secondary | ICD-10-CM | POA: Diagnosis not present

## 2022-06-13 DIAGNOSIS — M25322 Other instability, left elbow: Secondary | ICD-10-CM | POA: Diagnosis not present

## 2022-06-13 DIAGNOSIS — I130 Hypertensive heart and chronic kidney disease with heart failure and stage 1 through stage 4 chronic kidney disease, or unspecified chronic kidney disease: Secondary | ICD-10-CM | POA: Diagnosis not present

## 2022-06-13 DIAGNOSIS — T84113A Breakdown (mechanical) of internal fixation device of bone of left forearm, initial encounter: Secondary | ICD-10-CM | POA: Diagnosis not present

## 2022-06-13 DIAGNOSIS — E782 Mixed hyperlipidemia: Secondary | ICD-10-CM | POA: Diagnosis not present

## 2022-06-13 DIAGNOSIS — N1832 Chronic kidney disease, stage 3b: Secondary | ICD-10-CM | POA: Diagnosis not present

## 2022-06-13 DIAGNOSIS — Z6841 Body Mass Index (BMI) 40.0 and over, adult: Secondary | ICD-10-CM | POA: Diagnosis not present

## 2022-06-13 DIAGNOSIS — G4733 Obstructive sleep apnea (adult) (pediatric): Secondary | ICD-10-CM | POA: Diagnosis not present

## 2022-06-13 DIAGNOSIS — G8918 Other acute postprocedural pain: Secondary | ICD-10-CM | POA: Diagnosis not present

## 2022-06-13 DIAGNOSIS — I4821 Permanent atrial fibrillation: Secondary | ICD-10-CM | POA: Diagnosis not present

## 2022-06-13 DIAGNOSIS — I4819 Other persistent atrial fibrillation: Secondary | ICD-10-CM | POA: Diagnosis not present

## 2022-06-14 DIAGNOSIS — Y33XXXD Other specified events, undetermined intent, subsequent encounter: Secondary | ICD-10-CM | POA: Diagnosis not present

## 2022-06-14 DIAGNOSIS — M25322 Other instability, left elbow: Secondary | ICD-10-CM | POA: Diagnosis not present

## 2022-06-14 DIAGNOSIS — S53125D Posterior dislocation of left ulnohumeral joint, subsequent encounter: Secondary | ICD-10-CM | POA: Diagnosis not present

## 2022-06-14 DIAGNOSIS — S53125A Posterior dislocation of left ulnohumeral joint, initial encounter: Secondary | ICD-10-CM | POA: Diagnosis not present

## 2022-06-15 DIAGNOSIS — S53125D Posterior dislocation of left ulnohumeral joint, subsequent encounter: Secondary | ICD-10-CM | POA: Diagnosis not present

## 2022-06-15 DIAGNOSIS — M25322 Other instability, left elbow: Secondary | ICD-10-CM | POA: Diagnosis not present

## 2022-06-15 DIAGNOSIS — S53125A Posterior dislocation of left ulnohumeral joint, initial encounter: Secondary | ICD-10-CM | POA: Diagnosis not present

## 2022-06-16 DIAGNOSIS — S53125A Posterior dislocation of left ulnohumeral joint, initial encounter: Secondary | ICD-10-CM | POA: Diagnosis not present

## 2022-06-16 DIAGNOSIS — M25322 Other instability, left elbow: Secondary | ICD-10-CM | POA: Diagnosis not present

## 2022-06-16 DIAGNOSIS — S53125D Posterior dislocation of left ulnohumeral joint, subsequent encounter: Secondary | ICD-10-CM | POA: Diagnosis not present

## 2022-06-17 DIAGNOSIS — M25322 Other instability, left elbow: Secondary | ICD-10-CM | POA: Diagnosis not present

## 2022-06-17 DIAGNOSIS — S53125D Posterior dislocation of left ulnohumeral joint, subsequent encounter: Secondary | ICD-10-CM | POA: Diagnosis not present

## 2022-06-17 DIAGNOSIS — S53125A Posterior dislocation of left ulnohumeral joint, initial encounter: Secondary | ICD-10-CM | POA: Diagnosis not present

## 2022-06-18 DIAGNOSIS — S53125D Posterior dislocation of left ulnohumeral joint, subsequent encounter: Secondary | ICD-10-CM | POA: Diagnosis not present

## 2022-06-18 DIAGNOSIS — S53125A Posterior dislocation of left ulnohumeral joint, initial encounter: Secondary | ICD-10-CM | POA: Diagnosis not present

## 2022-06-18 DIAGNOSIS — M25322 Other instability, left elbow: Secondary | ICD-10-CM | POA: Diagnosis not present

## 2022-06-20 ENCOUNTER — Ambulatory Visit (INDEPENDENT_AMBULATORY_CARE_PROVIDER_SITE_OTHER): Payer: Medicare HMO | Admitting: *Deleted

## 2022-06-20 DIAGNOSIS — E1151 Type 2 diabetes mellitus with diabetic peripheral angiopathy without gangrene: Secondary | ICD-10-CM | POA: Diagnosis not present

## 2022-06-20 DIAGNOSIS — I4891 Unspecified atrial fibrillation: Secondary | ICD-10-CM | POA: Diagnosis not present

## 2022-06-20 DIAGNOSIS — M19022 Primary osteoarthritis, left elbow: Secondary | ICD-10-CM | POA: Diagnosis not present

## 2022-06-20 DIAGNOSIS — T63441D Toxic effect of venom of bees, accidental (unintentional), subsequent encounter: Secondary | ICD-10-CM | POA: Diagnosis not present

## 2022-06-20 DIAGNOSIS — G5622 Lesion of ulnar nerve, left upper limb: Secondary | ICD-10-CM | POA: Diagnosis not present

## 2022-06-20 DIAGNOSIS — Z4789 Encounter for other orthopedic aftercare: Secondary | ICD-10-CM | POA: Diagnosis not present

## 2022-06-20 DIAGNOSIS — I110 Hypertensive heart disease with heart failure: Secondary | ICD-10-CM | POA: Diagnosis not present

## 2022-06-20 DIAGNOSIS — G5602 Carpal tunnel syndrome, left upper limb: Secondary | ICD-10-CM | POA: Diagnosis not present

## 2022-06-20 DIAGNOSIS — I509 Heart failure, unspecified: Secondary | ICD-10-CM | POA: Diagnosis not present

## 2022-06-20 DIAGNOSIS — F419 Anxiety disorder, unspecified: Secondary | ICD-10-CM | POA: Diagnosis not present

## 2022-06-25 DIAGNOSIS — M19022 Primary osteoarthritis, left elbow: Secondary | ICD-10-CM | POA: Diagnosis not present

## 2022-06-25 DIAGNOSIS — E118 Type 2 diabetes mellitus with unspecified complications: Secondary | ICD-10-CM | POA: Diagnosis not present

## 2022-06-25 DIAGNOSIS — F419 Anxiety disorder, unspecified: Secondary | ICD-10-CM | POA: Diagnosis not present

## 2022-06-25 DIAGNOSIS — S53115D Anterior dislocation of left ulnohumeral joint, subsequent encounter: Secondary | ICD-10-CM | POA: Diagnosis not present

## 2022-06-25 DIAGNOSIS — I4891 Unspecified atrial fibrillation: Secondary | ICD-10-CM | POA: Diagnosis not present

## 2022-06-25 DIAGNOSIS — G5622 Lesion of ulnar nerve, left upper limb: Secondary | ICD-10-CM | POA: Diagnosis not present

## 2022-06-25 DIAGNOSIS — E1151 Type 2 diabetes mellitus with diabetic peripheral angiopathy without gangrene: Secondary | ICD-10-CM | POA: Diagnosis not present

## 2022-06-25 DIAGNOSIS — I480 Paroxysmal atrial fibrillation: Secondary | ICD-10-CM | POA: Diagnosis not present

## 2022-06-25 DIAGNOSIS — I110 Hypertensive heart disease with heart failure: Secondary | ICD-10-CM | POA: Diagnosis not present

## 2022-06-25 DIAGNOSIS — E1143 Type 2 diabetes mellitus with diabetic autonomic (poly)neuropathy: Secondary | ICD-10-CM | POA: Diagnosis not present

## 2022-06-25 DIAGNOSIS — Z4789 Encounter for other orthopedic aftercare: Secondary | ICD-10-CM | POA: Diagnosis not present

## 2022-06-25 DIAGNOSIS — L304 Erythema intertrigo: Secondary | ICD-10-CM | POA: Diagnosis not present

## 2022-06-25 DIAGNOSIS — I509 Heart failure, unspecified: Secondary | ICD-10-CM | POA: Diagnosis not present

## 2022-06-25 DIAGNOSIS — N184 Chronic kidney disease, stage 4 (severe): Secondary | ICD-10-CM | POA: Diagnosis not present

## 2022-06-25 DIAGNOSIS — G5602 Carpal tunnel syndrome, left upper limb: Secondary | ICD-10-CM | POA: Diagnosis not present

## 2022-06-25 DIAGNOSIS — I119 Hypertensive heart disease without heart failure: Secondary | ICD-10-CM | POA: Diagnosis not present

## 2022-06-26 DIAGNOSIS — W050XXD Fall from non-moving wheelchair, subsequent encounter: Secondary | ICD-10-CM | POA: Diagnosis not present

## 2022-06-26 DIAGNOSIS — S53115D Anterior dislocation of left ulnohumeral joint, subsequent encounter: Secondary | ICD-10-CM | POA: Diagnosis not present

## 2022-06-27 ENCOUNTER — Telehealth: Payer: Self-pay | Admitting: Cardiology

## 2022-06-27 DIAGNOSIS — Z4789 Encounter for other orthopedic aftercare: Secondary | ICD-10-CM | POA: Diagnosis not present

## 2022-06-27 DIAGNOSIS — M19022 Primary osteoarthritis, left elbow: Secondary | ICD-10-CM | POA: Diagnosis not present

## 2022-06-27 DIAGNOSIS — E1151 Type 2 diabetes mellitus with diabetic peripheral angiopathy without gangrene: Secondary | ICD-10-CM | POA: Diagnosis not present

## 2022-06-27 DIAGNOSIS — G5622 Lesion of ulnar nerve, left upper limb: Secondary | ICD-10-CM | POA: Diagnosis not present

## 2022-06-27 DIAGNOSIS — G5602 Carpal tunnel syndrome, left upper limb: Secondary | ICD-10-CM | POA: Diagnosis not present

## 2022-06-27 DIAGNOSIS — I509 Heart failure, unspecified: Secondary | ICD-10-CM | POA: Diagnosis not present

## 2022-06-27 DIAGNOSIS — I110 Hypertensive heart disease with heart failure: Secondary | ICD-10-CM | POA: Diagnosis not present

## 2022-06-27 DIAGNOSIS — I4891 Unspecified atrial fibrillation: Secondary | ICD-10-CM | POA: Diagnosis not present

## 2022-06-27 DIAGNOSIS — F419 Anxiety disorder, unspecified: Secondary | ICD-10-CM | POA: Diagnosis not present

## 2022-06-27 NOTE — Telephone Encounter (Signed)
Pt c/o medication issue:  1. Name of Medication:   furosemide (LASIX) 40 MG tablet    2. How are you currently taking this medication (dosage and times per day)? Started taking again  3. Are you having a reaction (difficulty breathing--STAT)?   4. What is your medication issue? Patient was told to stop taking, but states that after seeing PCP today, he was told to resume taking due to swelling. He states he started this medication again and would like to know how much fluid intake to have while on this medication. Please advise.

## 2022-07-02 ENCOUNTER — Other Ambulatory Visit: Payer: Self-pay

## 2022-07-02 NOTE — Telephone Encounter (Signed)
Patient was told to stop taking his furosemide, but states that after seeing PCP today, he was told to resume taking due to swelling. He states he started this medication again and would like to know how much fluid intake to have while on this medication. Please advise.

## 2022-07-03 DIAGNOSIS — E1151 Type 2 diabetes mellitus with diabetic peripheral angiopathy without gangrene: Secondary | ICD-10-CM | POA: Diagnosis not present

## 2022-07-03 DIAGNOSIS — I509 Heart failure, unspecified: Secondary | ICD-10-CM | POA: Diagnosis not present

## 2022-07-03 DIAGNOSIS — G5622 Lesion of ulnar nerve, left upper limb: Secondary | ICD-10-CM | POA: Diagnosis not present

## 2022-07-03 DIAGNOSIS — G5602 Carpal tunnel syndrome, left upper limb: Secondary | ICD-10-CM | POA: Diagnosis not present

## 2022-07-03 DIAGNOSIS — M19022 Primary osteoarthritis, left elbow: Secondary | ICD-10-CM | POA: Diagnosis not present

## 2022-07-03 DIAGNOSIS — I4891 Unspecified atrial fibrillation: Secondary | ICD-10-CM | POA: Diagnosis not present

## 2022-07-03 DIAGNOSIS — Z4789 Encounter for other orthopedic aftercare: Secondary | ICD-10-CM | POA: Diagnosis not present

## 2022-07-03 DIAGNOSIS — F419 Anxiety disorder, unspecified: Secondary | ICD-10-CM | POA: Diagnosis not present

## 2022-07-03 DIAGNOSIS — I110 Hypertensive heart disease with heart failure: Secondary | ICD-10-CM | POA: Diagnosis not present

## 2022-07-03 NOTE — Telephone Encounter (Signed)
Called patient and informed him of Dr. Hulen Shouts recommendation below:  "2 and half to 3 L a day"  Patient verbalized understanding and had no further questions at this time.

## 2022-07-06 ENCOUNTER — Other Ambulatory Visit: Payer: Self-pay | Admitting: Cardiology

## 2022-07-06 DIAGNOSIS — I4819 Other persistent atrial fibrillation: Secondary | ICD-10-CM

## 2022-07-08 DIAGNOSIS — E1122 Type 2 diabetes mellitus with diabetic chronic kidney disease: Secondary | ICD-10-CM | POA: Diagnosis not present

## 2022-07-08 DIAGNOSIS — N184 Chronic kidney disease, stage 4 (severe): Secondary | ICD-10-CM | POA: Diagnosis not present

## 2022-07-08 DIAGNOSIS — I129 Hypertensive chronic kidney disease with stage 1 through stage 4 chronic kidney disease, or unspecified chronic kidney disease: Secondary | ICD-10-CM | POA: Diagnosis not present

## 2022-07-08 DIAGNOSIS — N2889 Other specified disorders of kidney and ureter: Secondary | ICD-10-CM | POA: Diagnosis not present

## 2022-07-08 NOTE — Telephone Encounter (Signed)
Prescription refill request for Eliquis received. Indication:afib Last office visit:5/24 Scr:1.3  5/24 Age: 62 Weight:162.8  kg  Prescription refilled

## 2022-07-09 DIAGNOSIS — Z4789 Encounter for other orthopedic aftercare: Secondary | ICD-10-CM | POA: Diagnosis not present

## 2022-07-09 DIAGNOSIS — E1151 Type 2 diabetes mellitus with diabetic peripheral angiopathy without gangrene: Secondary | ICD-10-CM | POA: Diagnosis not present

## 2022-07-09 DIAGNOSIS — I4891 Unspecified atrial fibrillation: Secondary | ICD-10-CM | POA: Diagnosis not present

## 2022-07-09 DIAGNOSIS — G5602 Carpal tunnel syndrome, left upper limb: Secondary | ICD-10-CM | POA: Diagnosis not present

## 2022-07-09 DIAGNOSIS — I509 Heart failure, unspecified: Secondary | ICD-10-CM | POA: Diagnosis not present

## 2022-07-09 DIAGNOSIS — F419 Anxiety disorder, unspecified: Secondary | ICD-10-CM | POA: Diagnosis not present

## 2022-07-09 DIAGNOSIS — M19022 Primary osteoarthritis, left elbow: Secondary | ICD-10-CM | POA: Diagnosis not present

## 2022-07-09 DIAGNOSIS — I110 Hypertensive heart disease with heart failure: Secondary | ICD-10-CM | POA: Diagnosis not present

## 2022-07-09 DIAGNOSIS — G5622 Lesion of ulnar nerve, left upper limb: Secondary | ICD-10-CM | POA: Diagnosis not present

## 2022-07-16 DIAGNOSIS — G5602 Carpal tunnel syndrome, left upper limb: Secondary | ICD-10-CM | POA: Diagnosis not present

## 2022-07-16 DIAGNOSIS — M19022 Primary osteoarthritis, left elbow: Secondary | ICD-10-CM | POA: Diagnosis not present

## 2022-07-16 DIAGNOSIS — I509 Heart failure, unspecified: Secondary | ICD-10-CM | POA: Diagnosis not present

## 2022-07-16 DIAGNOSIS — E1151 Type 2 diabetes mellitus with diabetic peripheral angiopathy without gangrene: Secondary | ICD-10-CM | POA: Diagnosis not present

## 2022-07-16 DIAGNOSIS — I110 Hypertensive heart disease with heart failure: Secondary | ICD-10-CM | POA: Diagnosis not present

## 2022-07-16 DIAGNOSIS — G5622 Lesion of ulnar nerve, left upper limb: Secondary | ICD-10-CM | POA: Diagnosis not present

## 2022-07-16 DIAGNOSIS — Z4789 Encounter for other orthopedic aftercare: Secondary | ICD-10-CM | POA: Diagnosis not present

## 2022-07-16 DIAGNOSIS — F419 Anxiety disorder, unspecified: Secondary | ICD-10-CM | POA: Diagnosis not present

## 2022-07-16 DIAGNOSIS — I4891 Unspecified atrial fibrillation: Secondary | ICD-10-CM | POA: Diagnosis not present

## 2022-07-23 DIAGNOSIS — E1151 Type 2 diabetes mellitus with diabetic peripheral angiopathy without gangrene: Secondary | ICD-10-CM | POA: Diagnosis not present

## 2022-07-23 DIAGNOSIS — G5622 Lesion of ulnar nerve, left upper limb: Secondary | ICD-10-CM | POA: Diagnosis not present

## 2022-07-23 DIAGNOSIS — I4891 Unspecified atrial fibrillation: Secondary | ICD-10-CM | POA: Diagnosis not present

## 2022-07-23 DIAGNOSIS — Z4789 Encounter for other orthopedic aftercare: Secondary | ICD-10-CM | POA: Diagnosis not present

## 2022-07-23 DIAGNOSIS — I509 Heart failure, unspecified: Secondary | ICD-10-CM | POA: Diagnosis not present

## 2022-07-23 DIAGNOSIS — F419 Anxiety disorder, unspecified: Secondary | ICD-10-CM | POA: Diagnosis not present

## 2022-07-23 DIAGNOSIS — I110 Hypertensive heart disease with heart failure: Secondary | ICD-10-CM | POA: Diagnosis not present

## 2022-07-23 DIAGNOSIS — G5602 Carpal tunnel syndrome, left upper limb: Secondary | ICD-10-CM | POA: Diagnosis not present

## 2022-07-23 DIAGNOSIS — M19022 Primary osteoarthritis, left elbow: Secondary | ICD-10-CM | POA: Diagnosis not present

## 2022-07-26 DIAGNOSIS — S53115D Anterior dislocation of left ulnohumeral joint, subsequent encounter: Secondary | ICD-10-CM | POA: Diagnosis not present

## 2022-07-26 DIAGNOSIS — W050XXD Fall from non-moving wheelchair, subsequent encounter: Secondary | ICD-10-CM | POA: Diagnosis not present

## 2022-07-30 DIAGNOSIS — I509 Heart failure, unspecified: Secondary | ICD-10-CM | POA: Diagnosis not present

## 2022-07-30 DIAGNOSIS — I110 Hypertensive heart disease with heart failure: Secondary | ICD-10-CM | POA: Diagnosis not present

## 2022-07-30 DIAGNOSIS — E1151 Type 2 diabetes mellitus with diabetic peripheral angiopathy without gangrene: Secondary | ICD-10-CM | POA: Diagnosis not present

## 2022-07-30 DIAGNOSIS — I4891 Unspecified atrial fibrillation: Secondary | ICD-10-CM | POA: Diagnosis not present

## 2022-07-30 DIAGNOSIS — Z79899 Other long term (current) drug therapy: Secondary | ICD-10-CM | POA: Diagnosis not present

## 2022-07-30 DIAGNOSIS — M47816 Spondylosis without myelopathy or radiculopathy, lumbar region: Secondary | ICD-10-CM | POA: Diagnosis not present

## 2022-07-30 DIAGNOSIS — G5602 Carpal tunnel syndrome, left upper limb: Secondary | ICD-10-CM | POA: Diagnosis not present

## 2022-07-30 DIAGNOSIS — F419 Anxiety disorder, unspecified: Secondary | ICD-10-CM | POA: Diagnosis not present

## 2022-07-30 DIAGNOSIS — Z4789 Encounter for other orthopedic aftercare: Secondary | ICD-10-CM | POA: Diagnosis not present

## 2022-07-30 DIAGNOSIS — Z79891 Long term (current) use of opiate analgesic: Secondary | ICD-10-CM | POA: Diagnosis not present

## 2022-07-30 DIAGNOSIS — M5136 Other intervertebral disc degeneration, lumbar region: Secondary | ICD-10-CM | POA: Diagnosis not present

## 2022-07-30 DIAGNOSIS — G5622 Lesion of ulnar nerve, left upper limb: Secondary | ICD-10-CM | POA: Diagnosis not present

## 2022-07-30 DIAGNOSIS — M19022 Primary osteoarthritis, left elbow: Secondary | ICD-10-CM | POA: Diagnosis not present

## 2022-07-30 DIAGNOSIS — M25552 Pain in left hip: Secondary | ICD-10-CM | POA: Diagnosis not present

## 2022-07-30 DIAGNOSIS — M1612 Unilateral primary osteoarthritis, left hip: Secondary | ICD-10-CM | POA: Diagnosis not present

## 2022-07-30 DIAGNOSIS — Z1389 Encounter for screening for other disorder: Secondary | ICD-10-CM | POA: Diagnosis not present

## 2022-07-30 DIAGNOSIS — G894 Chronic pain syndrome: Secondary | ICD-10-CM | POA: Diagnosis not present

## 2022-08-02 DIAGNOSIS — E1151 Type 2 diabetes mellitus with diabetic peripheral angiopathy without gangrene: Secondary | ICD-10-CM | POA: Diagnosis not present

## 2022-08-02 DIAGNOSIS — M19022 Primary osteoarthritis, left elbow: Secondary | ICD-10-CM | POA: Diagnosis not present

## 2022-08-02 DIAGNOSIS — I4891 Unspecified atrial fibrillation: Secondary | ICD-10-CM | POA: Diagnosis not present

## 2022-08-02 DIAGNOSIS — G5622 Lesion of ulnar nerve, left upper limb: Secondary | ICD-10-CM | POA: Diagnosis not present

## 2022-08-02 DIAGNOSIS — Z4789 Encounter for other orthopedic aftercare: Secondary | ICD-10-CM | POA: Diagnosis not present

## 2022-08-02 DIAGNOSIS — I509 Heart failure, unspecified: Secondary | ICD-10-CM | POA: Diagnosis not present

## 2022-08-02 DIAGNOSIS — G5602 Carpal tunnel syndrome, left upper limb: Secondary | ICD-10-CM | POA: Diagnosis not present

## 2022-08-02 DIAGNOSIS — I110 Hypertensive heart disease with heart failure: Secondary | ICD-10-CM | POA: Diagnosis not present

## 2022-08-02 DIAGNOSIS — F419 Anxiety disorder, unspecified: Secondary | ICD-10-CM | POA: Diagnosis not present

## 2022-08-06 DIAGNOSIS — I4891 Unspecified atrial fibrillation: Secondary | ICD-10-CM | POA: Diagnosis not present

## 2022-08-06 DIAGNOSIS — I110 Hypertensive heart disease with heart failure: Secondary | ICD-10-CM | POA: Diagnosis not present

## 2022-08-06 DIAGNOSIS — E1151 Type 2 diabetes mellitus with diabetic peripheral angiopathy without gangrene: Secondary | ICD-10-CM | POA: Diagnosis not present

## 2022-08-06 DIAGNOSIS — M19022 Primary osteoarthritis, left elbow: Secondary | ICD-10-CM | POA: Diagnosis not present

## 2022-08-06 DIAGNOSIS — F419 Anxiety disorder, unspecified: Secondary | ICD-10-CM | POA: Diagnosis not present

## 2022-08-06 DIAGNOSIS — Z4789 Encounter for other orthopedic aftercare: Secondary | ICD-10-CM | POA: Diagnosis not present

## 2022-08-06 DIAGNOSIS — I509 Heart failure, unspecified: Secondary | ICD-10-CM | POA: Diagnosis not present

## 2022-08-06 DIAGNOSIS — G5622 Lesion of ulnar nerve, left upper limb: Secondary | ICD-10-CM | POA: Diagnosis not present

## 2022-08-06 DIAGNOSIS — G5602 Carpal tunnel syndrome, left upper limb: Secondary | ICD-10-CM | POA: Diagnosis not present

## 2022-08-09 DIAGNOSIS — E1151 Type 2 diabetes mellitus with diabetic peripheral angiopathy without gangrene: Secondary | ICD-10-CM | POA: Diagnosis not present

## 2022-08-09 DIAGNOSIS — I509 Heart failure, unspecified: Secondary | ICD-10-CM | POA: Diagnosis not present

## 2022-08-09 DIAGNOSIS — I110 Hypertensive heart disease with heart failure: Secondary | ICD-10-CM | POA: Diagnosis not present

## 2022-08-09 DIAGNOSIS — G5622 Lesion of ulnar nerve, left upper limb: Secondary | ICD-10-CM | POA: Diagnosis not present

## 2022-08-09 DIAGNOSIS — I4891 Unspecified atrial fibrillation: Secondary | ICD-10-CM | POA: Diagnosis not present

## 2022-08-09 DIAGNOSIS — F419 Anxiety disorder, unspecified: Secondary | ICD-10-CM | POA: Diagnosis not present

## 2022-08-09 DIAGNOSIS — M19022 Primary osteoarthritis, left elbow: Secondary | ICD-10-CM | POA: Diagnosis not present

## 2022-08-09 DIAGNOSIS — Z4789 Encounter for other orthopedic aftercare: Secondary | ICD-10-CM | POA: Diagnosis not present

## 2022-08-09 DIAGNOSIS — G5602 Carpal tunnel syndrome, left upper limb: Secondary | ICD-10-CM | POA: Diagnosis not present

## 2022-08-13 DIAGNOSIS — Z4789 Encounter for other orthopedic aftercare: Secondary | ICD-10-CM | POA: Diagnosis not present

## 2022-08-13 DIAGNOSIS — M19022 Primary osteoarthritis, left elbow: Secondary | ICD-10-CM | POA: Diagnosis not present

## 2022-08-13 DIAGNOSIS — I110 Hypertensive heart disease with heart failure: Secondary | ICD-10-CM | POA: Diagnosis not present

## 2022-08-13 DIAGNOSIS — E1151 Type 2 diabetes mellitus with diabetic peripheral angiopathy without gangrene: Secondary | ICD-10-CM | POA: Diagnosis not present

## 2022-08-13 DIAGNOSIS — G5602 Carpal tunnel syndrome, left upper limb: Secondary | ICD-10-CM | POA: Diagnosis not present

## 2022-08-13 DIAGNOSIS — I4891 Unspecified atrial fibrillation: Secondary | ICD-10-CM | POA: Diagnosis not present

## 2022-08-13 DIAGNOSIS — I509 Heart failure, unspecified: Secondary | ICD-10-CM | POA: Diagnosis not present

## 2022-08-13 DIAGNOSIS — G5622 Lesion of ulnar nerve, left upper limb: Secondary | ICD-10-CM | POA: Diagnosis not present

## 2022-08-13 DIAGNOSIS — F419 Anxiety disorder, unspecified: Secondary | ICD-10-CM | POA: Diagnosis not present

## 2022-08-15 ENCOUNTER — Ambulatory Visit (INDEPENDENT_AMBULATORY_CARE_PROVIDER_SITE_OTHER): Payer: Medicare HMO | Admitting: *Deleted

## 2022-08-15 DIAGNOSIS — I509 Heart failure, unspecified: Secondary | ICD-10-CM | POA: Diagnosis not present

## 2022-08-15 DIAGNOSIS — Z4789 Encounter for other orthopedic aftercare: Secondary | ICD-10-CM | POA: Diagnosis not present

## 2022-08-15 DIAGNOSIS — T63441D Toxic effect of venom of bees, accidental (unintentional), subsequent encounter: Secondary | ICD-10-CM

## 2022-08-15 DIAGNOSIS — M19022 Primary osteoarthritis, left elbow: Secondary | ICD-10-CM | POA: Diagnosis not present

## 2022-08-15 DIAGNOSIS — G5602 Carpal tunnel syndrome, left upper limb: Secondary | ICD-10-CM | POA: Diagnosis not present

## 2022-08-15 DIAGNOSIS — I110 Hypertensive heart disease with heart failure: Secondary | ICD-10-CM | POA: Diagnosis not present

## 2022-08-15 DIAGNOSIS — G5622 Lesion of ulnar nerve, left upper limb: Secondary | ICD-10-CM | POA: Diagnosis not present

## 2022-08-15 DIAGNOSIS — F419 Anxiety disorder, unspecified: Secondary | ICD-10-CM | POA: Diagnosis not present

## 2022-08-15 DIAGNOSIS — E1151 Type 2 diabetes mellitus with diabetic peripheral angiopathy without gangrene: Secondary | ICD-10-CM | POA: Diagnosis not present

## 2022-08-15 DIAGNOSIS — I4891 Unspecified atrial fibrillation: Secondary | ICD-10-CM | POA: Diagnosis not present

## 2022-08-19 DIAGNOSIS — R400 Somnolence: Secondary | ICD-10-CM | POA: Diagnosis not present

## 2022-08-19 DIAGNOSIS — J4520 Mild intermittent asthma, uncomplicated: Secondary | ICD-10-CM | POA: Diagnosis not present

## 2022-08-19 DIAGNOSIS — G4733 Obstructive sleep apnea (adult) (pediatric): Secondary | ICD-10-CM | POA: Diagnosis not present

## 2022-08-19 DIAGNOSIS — R5383 Other fatigue: Secondary | ICD-10-CM | POA: Diagnosis not present

## 2022-08-20 DIAGNOSIS — G5602 Carpal tunnel syndrome, left upper limb: Secondary | ICD-10-CM | POA: Diagnosis not present

## 2022-08-20 DIAGNOSIS — F419 Anxiety disorder, unspecified: Secondary | ICD-10-CM | POA: Diagnosis not present

## 2022-08-20 DIAGNOSIS — E1151 Type 2 diabetes mellitus with diabetic peripheral angiopathy without gangrene: Secondary | ICD-10-CM | POA: Diagnosis not present

## 2022-08-20 DIAGNOSIS — I110 Hypertensive heart disease with heart failure: Secondary | ICD-10-CM | POA: Diagnosis not present

## 2022-08-20 DIAGNOSIS — S53115D Anterior dislocation of left ulnohumeral joint, subsequent encounter: Secondary | ICD-10-CM | POA: Diagnosis not present

## 2022-08-20 DIAGNOSIS — I509 Heart failure, unspecified: Secondary | ICD-10-CM | POA: Diagnosis not present

## 2022-08-20 DIAGNOSIS — G5622 Lesion of ulnar nerve, left upper limb: Secondary | ICD-10-CM | POA: Diagnosis not present

## 2022-08-20 DIAGNOSIS — I4891 Unspecified atrial fibrillation: Secondary | ICD-10-CM | POA: Diagnosis not present

## 2022-08-20 DIAGNOSIS — M19022 Primary osteoarthritis, left elbow: Secondary | ICD-10-CM | POA: Diagnosis not present

## 2022-08-21 DIAGNOSIS — M25322 Other instability, left elbow: Secondary | ICD-10-CM | POA: Diagnosis not present

## 2022-08-21 DIAGNOSIS — G8918 Other acute postprocedural pain: Secondary | ICD-10-CM | POA: Diagnosis not present

## 2022-08-21 DIAGNOSIS — E119 Type 2 diabetes mellitus without complications: Secondary | ICD-10-CM | POA: Diagnosis not present

## 2022-08-21 DIAGNOSIS — S53115D Anterior dislocation of left ulnohumeral joint, subsequent encounter: Secondary | ICD-10-CM | POA: Diagnosis not present

## 2022-08-21 DIAGNOSIS — J45909 Unspecified asthma, uncomplicated: Secondary | ICD-10-CM | POA: Diagnosis not present

## 2022-08-21 DIAGNOSIS — G5622 Lesion of ulnar nerve, left upper limb: Secondary | ICD-10-CM | POA: Diagnosis not present

## 2022-08-21 DIAGNOSIS — K219 Gastro-esophageal reflux disease without esophagitis: Secondary | ICD-10-CM | POA: Diagnosis not present

## 2022-08-21 DIAGNOSIS — M19022 Primary osteoarthritis, left elbow: Secondary | ICD-10-CM | POA: Diagnosis not present

## 2022-08-21 DIAGNOSIS — I2510 Atherosclerotic heart disease of native coronary artery without angina pectoris: Secondary | ICD-10-CM | POA: Diagnosis not present

## 2022-08-21 DIAGNOSIS — X58XXXD Exposure to other specified factors, subsequent encounter: Secondary | ICD-10-CM | POA: Diagnosis not present

## 2022-08-21 DIAGNOSIS — I4891 Unspecified atrial fibrillation: Secondary | ICD-10-CM | POA: Diagnosis not present

## 2022-09-02 DIAGNOSIS — W050XXD Fall from non-moving wheelchair, subsequent encounter: Secondary | ICD-10-CM | POA: Diagnosis not present

## 2022-09-02 DIAGNOSIS — S53115D Anterior dislocation of left ulnohumeral joint, subsequent encounter: Secondary | ICD-10-CM | POA: Diagnosis not present

## 2022-09-11 NOTE — Progress Notes (Signed)
Cardiology Office Note:    Date:  09/16/2022   ID:  David Fitzgerald, DOB May 02, 1960, MRN [id number]  PCP:  [facility]  Cardiologist:  David Herrlich, MD    Referring MD: David Saupe, MD    ASSESSMENT:    1. Permanent atrial fibrillation (HCC)   2. Chronic anticoagulation   3. Hypotension, unspecified hypotension type   4. Hypertensive heart disease with chronic combined systolic and diastolic congestive heart failure (HCC)    PLAN:    In order of problems listed above:  I am unsure if his rate is well-controlled he will start to record his heart rate at rest daily bring it to the office with him and continue his current beta-blocker dosage note he declined digoxin to assist with rate control Continue his anticoagulant No further episodes of symptomatic hypotension Continue his current treatment including loop diuretic and beta-blocker and avoid vasodilators with previous severe symptomatic hypotension   Next appointment: 3 months   Medication Adjustments/Labs and Tests Ordered: Current medicines are reviewed at length with the patient today.  Concerns regarding medicines are outlined above.  Orders Placed This Encounter  Procedures   EKG 12-Lead   No orders of the defined types were placed in this encounter.    History of Present Illness:    David Fitzgerald is a 62 y.o. male with a hx of chronic permanent atrial fibrillation with anticoagulation hypertensive heart disease with heart failure mildly reduced ejection fraction 40 to 45% and previous symptomatic hypotension with acute kidney injury last seen 06/07/2022.  Compliance with diet, lifestyle and medications: Yes  On his own he increased his diuretic to twice a day good response he still has some edema in the right lower extremity 3 days a week he will take an extra tablet Overdue for labs today check renal function proBNP CBC At home he says his heart rates are less than 100 he is  tachycardic in my office he tells me his blood pressure is good and I have asked him to start recording and bring the numbers to the office with him at his next visit We discussed adding digoxin for rate control but he told me his father died of digoxin toxicity and he declines We discussed an event monitor at home but he says he prefers to monitor his heart rate He is not having shortness of breath chest pain palpitation or syncope He tolerates his statin without muscle pain or weakness He has had no bleeding from his anticoagulant Past Medical History:  Diagnosis Date   A-fib (HCC)    Abscess of ankle    Abscess of bursa of left ankle 10/04/2020   Allergy to pollen 04/06/2015   Ankle fracture 01/31/2020   Anxiety 02/06/2017   Arthritis 02/06/2017   Asthma    as a child   Atrial fibrillation with rapid ventricular response (HCC) 12/08/2020   Bee sting allergy    wasp / mixed vespid   Bell's palsy 02/24/2013   BMI 45.0-49.9, adult (HCC) 07/09/2012   CAD in native artery 02/07/2017   CHF (congestive heart failure) (HCC)    Chronic anticoagulation 03/07/2017   Chronic pain syndrome 07/09/2012   Chronic venous insufficiency 12/14/2014   Chronic, continuous use of opioids 02/15/2020   Coronary artery calcification seen on CT scan 02/07/2017   Depression    Diabetes (HCC)    Dyspnea on minimal exertion 12/08/2020   Dysrhythmia    afib   Edema 12/14/2014  Epigastric pain 12/08/2020   GERD (gastroesophageal reflux disease)    Hematuria 12/08/2020   History of kidney stones    Hyperlipidemia, mixed 12/14/2014   Hypertension    Hypertensive heart disease 12/14/2014   Hypotension 12/25/2017   IBS (irritable bowel syndrome)    Instability of left elbow joint 02/22/2021   Left ankle pain 04/05/2020   Lumbar pain with radiation down left leg 07/09/2012   Morbid obesity (HCC) 12/14/2014   Morbid obesity with BMI of 40.0-44.9, adult (HCC) 12/14/2014   Morbid obesity with BMI of  45.0-49.9, adult (HCC) 07/09/2012   Nausea & vomiting 12/08/2020   Obesity hypoventilation syndrome (HCC) 12/14/2014   Open dislocation of left ankle    Open fracture dislocation of left ankle 01/31/2020   OSA (obstructive sleep apnea) 03/09/2020   doesn't use a Cpap   OSA treated with BiPAP 12/14/2014   Pain syndrome, chronic 07/09/2012   Permanent atrial fibrillation (HCC)    Pernicious anemia 02/06/2017   Persistent atrial fibrillation (HCC) 02/06/2017   CHADS2vasc of 2   Pneumonia    Polyneuropathy in diabetes (HCC) 02/24/2013   Subacute osteomyelitis, left ankle and foot (HCC)    Syndrome affecting cervical region 02/24/2013   Trigeminal neuralgia 02/24/2013   Type 2 diabetes mellitus with diabetic neuropathy (HCC) 12/14/2014   Type II diabetes mellitus, uncontrolled 12/08/2020   Vitamin D deficiency 02/16/2020   Wound dehiscence, traumatic injury repair, left medial ankle  03/08/2020    Current Medications: Current Meds  Medication Sig   ascorbic acid (VITAMIN C) 1000 MG tablet Take 1,000 mg by mouth daily.   Cholecalciferol (VITAMIN D3) 125 MCG (5000 UT) TABS Take 5,000 Units by mouth daily.   Cyanocobalamin (B-12) 2500 MCG TABS Take 2,500 mcg by mouth daily.   DULoxetine (CYMBALTA) 30 MG capsule Take 30 mg by mouth at bedtime.   DULoxetine (CYMBALTA) 60 MG capsule Take 60 mg by mouth every morning.   ELIQUIS 5 MG TABS tablet TAKE 1 TABLET(5 MG) BY MOUTH TWICE DAILY   EPINEPHrine 0.3 mg/0.3 mL IJ SOAJ injection Inject 0.3 mg into the muscle as needed for anaphylaxis.   furosemide (LASIX) 40 MG tablet Take 40 mg by mouth 2 (two) times daily.   gabapentin (NEURONTIN) 300 MG capsule Take 300 mg by mouth 2 (two) times daily.   Lactobacillus (PROBIOTIC GOLD EXTRA STRENGTH) CAPS Take 1 tablet by mouth daily.   metoprolol succinate (TOPROL-XL) 50 MG 24 hr tablet Take 3 tablets (150 mg total) by mouth daily. (Patient taking differently: Take 50 mg by mouth daily.)   naloxone  (NARCAN) nasal spray 4 mg/0.1 mL Place 1 spray into the nose as needed for opioid reversal.   nitroGLYCERIN (NITROSTAT) 0.4 MG SL tablet Place 1 tablet (0.4 mg total) under the tongue every 5 (five) minutes as needed for chest pain.   pantoprazole (PROTONIX) 40 MG tablet Take 40 mg by mouth daily.   simvastatin (ZOCOR) 20 MG tablet Take 20 mg by mouth at bedtime.   tamsulosin (FLOMAX) 0.4 MG CAPS capsule Take 0.4 mg by mouth daily.   [DISCONTINUED] gabapentin (NEURONTIN) 600 MG tablet Take 300 mg by mouth 2 (two) times daily.      EKGs/Labs/Other Studies Reviewed:    The following studies were reviewed today:  Cardiac Studies & Procedures       ECHOCARDIOGRAM  ECHOCARDIOGRAM LIMITED 07/31/2021  Narrative ECHOCARDIOGRAM LIMITED REPORT    Patient Name:   Demetries ARIS DEROUSSE Date of Exam: 07/31/2021 Medical Rec #:  [id number]      Height:       74.0 in Accession #:    [id number]     Weight:       378.0 lb Date of Birth:  November 10, 1960     BSA:          2.849 m Patient Age:    62 years       BP:           120/90 mmHg Patient Gender: M              HR:           113 bpm. Exam Location:  [place]  Procedure: Intracardiac Opacification Agent, Limited Echo and Color Doppler  Indications:    Hypertension, unspecified type [I10 (ICD-10-CM)]  History:        Patient has prior history of Echocardiogram examinations, most recent 06/21/2021. CHF, Arrythmias:Atrial Fibrillation; Risk Factors:Hypertension and Dyslipidemia.  Sonographer:    Margreta Journey RDCS Referring Phys: [id number] Baldo Daub   Sonographer Comments: Image acquisition challenging due to patient body habitus and Image acquisition challenging due to respiratory motion. IMPRESSIONS   1. Left ventricular ejection fraction, by estimation, appears to be 40-45%. Contrast was utilized howere in view of A fib, IVCD and suboptimal nature of the study, this assesment is suboptimal. May need other modalities of imaging. This was a  limited study.  FINDINGS Left Ventricle: Left ventricular ejection fraction, by estimation, is 60 to 65%. The left ventricle has normal function. The left ventricle has no regional wall motion abnormalities. Definity contrast agent was given IV to delineate the left ventricular endocardial borders. The left ventricular internal cavity size was normal in size. There is no left ventricular hypertrophy.   Belva Crome MD Electronically signed by Belva Crome MD Signature Date/Time: 07/31/2021/5:36:34 PM    Final    MONITORS  LONG TERM MONITOR (3-14 DAYS) 10/25/2019  Narrative Is a monitor was performed for 7 days beginning 09/28/2019 to assess heart rate response with chronic atrial fibrillation.  Daytime his heart rate profile was 50-110 86% of the time, 2% 1 10-1 50 and less than 1% greater than 150 bpm.  Nighttime his heart rate profile was 50-110 87% of the time, 110 to 152% of the time and greater than 150 less than 1% of the time.  No pauses of 3 seconds or greater.  Regular ectopy was rare with isolated PVCs 2 couplets and one triplet.  Acute event was seen with atrial fibrillation.   Occlusion atrial fibrillation, heart rate control is good.               Recent Labs: 11/01/2021: BUN 21; Creatinine, Ser 1.57; NT-Pro BNP 247; Potassium 4.8; Sodium 139  Recent Lipid Panel    Component Value Date/Time   CHOL 156 12/03/2018 1502   TRIG 301 (H) 12/03/2018 1502   HDL 29 (L) 12/03/2018 1502   CHOLHDL 5.4 (H) 12/03/2018 1502   LDLCALC 78 12/03/2018 1502    Physical Exam:    VS:  BP (!) 148/69   Pulse (!) 126   Ht 6\' 2"  (1.88 m)   Wt (!) 360 lb 12.8 oz (163.7 kg)   SpO2 95%   BMI 46.32 kg/m     Wt Readings from Last 3 Encounters:  09/16/22 (!) 360 lb 12.8 oz (163.7 kg)  06/07/22 (!) 359 lb (162.8 kg)  05/01/22 (!) 359 lb (162.8 kg)     GEN:  Well nourished, well developed in  no acute distress HEENT: Normal NECK: No JVD; No carotid bruits LYMPHATICS:  No lymphadenopathy CARDIAC: Irregular rate and rhythm variable first heart sound  RESPIRATORY:  Clear to auscultation without rales, wheezing or rhonchi  ABDOMEN: Soft, non-tender, non-distended MUSCULOSKELETAL: Right lower extremity 1+ edema edema; No deformity  SKIN: Warm and dry NEUROLOGIC:  Alert and oriented x 3 PSYCHIATRIC:  Normal affect    Signed, David Herrlich, MD  09/16/2022 2:50 PM    [facility]

## 2022-09-16 ENCOUNTER — Encounter: Payer: Self-pay | Admitting: Cardiology

## 2022-09-16 ENCOUNTER — Ambulatory Visit: Payer: Medicare HMO | Attending: Cardiology | Admitting: Cardiology

## 2022-09-16 VITALS — BP 148/69 | HR 126 | Ht 74.0 in | Wt 360.8 lb

## 2022-09-16 DIAGNOSIS — I959 Hypotension, unspecified: Secondary | ICD-10-CM

## 2022-09-16 DIAGNOSIS — I5042 Chronic combined systolic (congestive) and diastolic (congestive) heart failure: Secondary | ICD-10-CM | POA: Diagnosis not present

## 2022-09-16 DIAGNOSIS — I110 Hypertensive heart disease with heart failure: Secondary | ICD-10-CM | POA: Diagnosis not present

## 2022-09-16 DIAGNOSIS — Z7901 Long term (current) use of anticoagulants: Secondary | ICD-10-CM

## 2022-09-16 DIAGNOSIS — I4821 Permanent atrial fibrillation: Secondary | ICD-10-CM

## 2022-09-16 MED ORDER — FUROSEMIDE 40 MG PO TABS: 40.0000 mg | ORAL_TABLET | Freq: Two times a day (BID) | ORAL | 3 refills | Status: DC

## 2022-09-16 NOTE — Patient Instructions (Addendum)
Medication Instructions:  Your physician has recommended you make the following change in your medication:   Furosemide 40 mg twice daily (On M - W - F take a third tablet of Furosemide)  *If you need a refill on your cardiac medications before your next appointment, please call your pharmacy*   Lab Work: Your physician recommends that you return for lab work in:   Labs today: CMP, CBC, Lipids, Pro BNP  If you have labs (blood work) drawn today and your tests are completely normal, you will receive your results only by: MyChart Message (if you have MyChart) OR A paper copy in the mail If you have any lab test that is abnormal or we need to change your treatment, we will call you to review the results.   Testing/Procedures: None   Follow-Up: At [facility], you and your health needs are our priority.  As part of our continuing mission to provide you with exceptional heart care, we have created designated Provider Care Teams.  These Care Teams include your primary Cardiologist (physician) and Advanced Practice Providers (APPs -  Physician Assistants and Nurse Practitioners) who all work together to provide you with the care you need, when you need it.  We recommend signing up for the patient portal called "MyChart".  Sign up information is provided on this After Visit Summary.  MyChart is used to connect with patients for Virtual Visits (Telemedicine).  Patients are able to view lab/test results, encounter notes, upcoming appointments, etc.  Non-urgent messages can be sent to your provider as well.   To learn more about what you can do with MyChart, go to [email or web address].    Your next appointment:   3 month(s)  Provider:   Norman Herrlich, MD    Other Instructions Record blood pressure and heart rate daily and bring to your next office visit

## 2022-09-24 DIAGNOSIS — Z79891 Long term (current) use of opiate analgesic: Secondary | ICD-10-CM | POA: Diagnosis not present

## 2022-09-24 DIAGNOSIS — Z1389 Encounter for screening for other disorder: Secondary | ICD-10-CM | POA: Diagnosis not present

## 2022-09-24 DIAGNOSIS — Z79899 Other long term (current) drug therapy: Secondary | ICD-10-CM | POA: Diagnosis not present

## 2022-09-24 DIAGNOSIS — M1612 Unilateral primary osteoarthritis, left hip: Secondary | ICD-10-CM | POA: Diagnosis not present

## 2022-09-24 DIAGNOSIS — M47816 Spondylosis without myelopathy or radiculopathy, lumbar region: Secondary | ICD-10-CM | POA: Diagnosis not present

## 2022-09-24 DIAGNOSIS — G894 Chronic pain syndrome: Secondary | ICD-10-CM | POA: Diagnosis not present

## 2022-09-24 DIAGNOSIS — M5136 Other intervertebral disc degeneration, lumbar region: Secondary | ICD-10-CM | POA: Diagnosis not present

## 2022-09-24 DIAGNOSIS — M25552 Pain in left hip: Secondary | ICD-10-CM | POA: Diagnosis not present

## 2022-09-30 DIAGNOSIS — S53115D Anterior dislocation of left ulnohumeral joint, subsequent encounter: Secondary | ICD-10-CM | POA: Diagnosis not present

## 2022-09-30 DIAGNOSIS — Y33XXXD Other specified events, undetermined intent, subsequent encounter: Secondary | ICD-10-CM | POA: Diagnosis not present

## 2022-10-10 ENCOUNTER — Ambulatory Visit (INDEPENDENT_AMBULATORY_CARE_PROVIDER_SITE_OTHER): Payer: Medicare HMO

## 2022-10-10 DIAGNOSIS — T63441D Toxic effect of venom of bees, accidental (unintentional), subsequent encounter: Secondary | ICD-10-CM

## 2022-10-14 DIAGNOSIS — I4891 Unspecified atrial fibrillation: Secondary | ICD-10-CM | POA: Diagnosis not present

## 2022-10-14 DIAGNOSIS — I509 Heart failure, unspecified: Secondary | ICD-10-CM | POA: Diagnosis not present

## 2022-10-14 DIAGNOSIS — E1151 Type 2 diabetes mellitus with diabetic peripheral angiopathy without gangrene: Secondary | ICD-10-CM | POA: Diagnosis not present

## 2022-10-14 DIAGNOSIS — Z96632 Presence of left artificial wrist joint: Secondary | ICD-10-CM | POA: Diagnosis not present

## 2022-10-14 DIAGNOSIS — N189 Chronic kidney disease, unspecified: Secondary | ICD-10-CM | POA: Diagnosis not present

## 2022-10-14 DIAGNOSIS — I130 Hypertensive heart and chronic kidney disease with heart failure and stage 1 through stage 4 chronic kidney disease, or unspecified chronic kidney disease: Secondary | ICD-10-CM | POA: Diagnosis not present

## 2022-10-14 DIAGNOSIS — E1122 Type 2 diabetes mellitus with diabetic chronic kidney disease: Secondary | ICD-10-CM | POA: Diagnosis not present

## 2022-10-14 DIAGNOSIS — I2510 Atherosclerotic heart disease of native coronary artery without angina pectoris: Secondary | ICD-10-CM | POA: Diagnosis not present

## 2022-10-14 DIAGNOSIS — T84018D Broken internal joint prosthesis, other site, subsequent encounter: Secondary | ICD-10-CM | POA: Diagnosis not present

## 2022-10-21 DIAGNOSIS — I509 Heart failure, unspecified: Secondary | ICD-10-CM | POA: Diagnosis not present

## 2022-10-21 DIAGNOSIS — T84018D Broken internal joint prosthesis, other site, subsequent encounter: Secondary | ICD-10-CM | POA: Diagnosis not present

## 2022-10-21 DIAGNOSIS — I130 Hypertensive heart and chronic kidney disease with heart failure and stage 1 through stage 4 chronic kidney disease, or unspecified chronic kidney disease: Secondary | ICD-10-CM | POA: Diagnosis not present

## 2022-10-21 DIAGNOSIS — E1151 Type 2 diabetes mellitus with diabetic peripheral angiopathy without gangrene: Secondary | ICD-10-CM | POA: Diagnosis not present

## 2022-10-21 DIAGNOSIS — I4891 Unspecified atrial fibrillation: Secondary | ICD-10-CM | POA: Diagnosis not present

## 2022-10-21 DIAGNOSIS — Z96632 Presence of left artificial wrist joint: Secondary | ICD-10-CM | POA: Diagnosis not present

## 2022-10-21 DIAGNOSIS — E1122 Type 2 diabetes mellitus with diabetic chronic kidney disease: Secondary | ICD-10-CM | POA: Diagnosis not present

## 2022-10-21 DIAGNOSIS — N189 Chronic kidney disease, unspecified: Secondary | ICD-10-CM | POA: Diagnosis not present

## 2022-10-21 DIAGNOSIS — I2510 Atherosclerotic heart disease of native coronary artery without angina pectoris: Secondary | ICD-10-CM | POA: Diagnosis not present

## 2022-11-13 ENCOUNTER — Other Ambulatory Visit: Payer: Self-pay | Admitting: Cardiology

## 2022-11-14 NOTE — Telephone Encounter (Signed)
Rx refill sent to pharmacy. 

## 2022-12-05 ENCOUNTER — Ambulatory Visit (INDEPENDENT_AMBULATORY_CARE_PROVIDER_SITE_OTHER): Payer: Medicare HMO | Admitting: *Deleted

## 2022-12-05 DIAGNOSIS — T63441D Toxic effect of venom of bees, accidental (unintentional), subsequent encounter: Secondary | ICD-10-CM

## 2022-12-15 NOTE — Progress Notes (Unsigned)
Cardiology Office Note:    Date:  12/17/2022   ID:  David Fitzgerald, DOB 10-04-1960, MRN [id number]  PCP:  Patient, No Pcp Per  Cardiologist:  David Herrlich, MD    Referring MD: [facility], David [facility]*    ASSESSMENT:    1. Permanent atrial fibrillation (HCC)   2. Chronic anticoagulation   3. Hypertensive heart disease with chronic combined systolic and diastolic congestive heart failure (HCC)   4. Hypotension, unspecified hypotension type    PLAN:    In order of problems listed above:  David Fitzgerald continues to do well he is rate controlled atrial fibrillation continue his beta-blocker and current anticoagulant with application Stable no fluid overload continue his current loop diuretic and beta-blocker. Home blood pressures good technique validated his device are at target No recurrent hypotension Will discuss a repeat echocardiogram around the time of his next visit   Next appointment: 9 months   Medication Adjustments/Labs and Tests Ordered: Current medicines are reviewed at length with the patient today.  Concerns regarding medicines are outlined above.  No orders of the defined types were placed in this encounter.  No orders of the defined types were placed in this encounter.    History of Present Illness:    David Fitzgerald is a 62 y.o. male with a hx of chronic/permanent atrial fibrillation on long-term anticoagulation hypertensive heart disease with heart failure previous ejection fraction 40 to 45% and previous symptomatic hypotension with acute kidney injury last seen here 09/16/2022.  Compliance with diet, lifestyle and medications: Yes  He has completed his surgical intervention at [facility] without complication he is little disappointed in the results and does not plan any further surgery at this time he had no operative complications He tolerates his anticoagulant without bleeding Blood pressure runs 120 and 140/70-80 He not having edema shortness of breath  orthopnea chest pain palpitation or syncope He tolerates his statin without muscle pain or weakness Recent labs 09/16/2022 cholesterol 124 LDL 66 A1c 6.0 hemoglobin 40.9 creatinine 1.1 potassium 3.7 Past Medical History:  Diagnosis Date   A-fib (HCC)    Abscess of ankle    Abscess of bursa of left ankle 10/04/2020   Allergy to pollen 04/06/2015   Anemia, unspecified 02/16/2020   Ankle fracture 01/31/2020   Anxiety 02/06/2017   Arthritis 02/06/2017   Asthma    as a child   Atrial fibrillation with rapid ventricular response (HCC) 12/08/2020   Bee sting allergy    wasp / mixed vespid   Bell's palsy 02/24/2013   BMI 45.0-49.9, adult (HCC) 07/09/2012   CAD in native artery 02/07/2017   CHF (congestive heart failure) (HCC)    Chronic anticoagulation 03/07/2017   Chronic pain syndrome 07/09/2012   Chronic venous insufficiency 12/14/2014   Chronic, continuous use of opioids 02/15/2020   Coronary artery calcification seen on CT scan 02/07/2017   Depression    Diabetes (HCC)    Difficulty in walking, not elsewhere classified 02/17/2020   Dislocation of left elbow    Dyspnea on minimal exertion 12/08/2020   Dysrhythmia    afib   Edema 12/14/2014   Epigastric pain 12/08/2020   GERD (gastroesophageal reflux disease)    Hematuria 12/08/2020   History of kidney stones    Hyperlipidemia, mixed 12/14/2014   Hypertension    Hypertensive heart disease 12/14/2014   Hypotension 12/25/2017   IBS (irritable bowel syndrome)    Infection following procedure 04/26/2021   Formatting of this note might be different from  the original.  Added automatically from request for surgery [id number]     Instability of left elbow joint 02/22/2021   Left ankle pain 04/05/2020   Long term (current) use of opiate analgesic 02/16/2020   Lumbar pain with radiation down left leg 07/09/2012   Morbid obesity (HCC) 12/14/2014   Morbid obesity with BMI of 40.0-44.9, adult (HCC) 12/14/2014   Morbid obesity with BMI  of 45.0-49.9, adult (HCC) 07/09/2012   Muscle weakness (generalized) 02/17/2020   Nausea & vomiting 12/08/2020   Obesity hypoventilation syndrome (HCC) 12/14/2014   Open dislocation of left ankle    Open fracture dislocation of left ankle 01/31/2020   OSA (obstructive sleep apnea) 03/09/2020   doesn't use a Cpap   OSA treated with BiPAP 12/14/2014   Pain syndrome, chronic 07/09/2012   Permanent atrial fibrillation (HCC)    Pernicious anemia 02/06/2017   Persistent atrial fibrillation (HCC) 02/06/2017   CHADS2vasc of 2   Pneumonia    Polyneuropathy in diabetes (HCC) 02/24/2013   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   Cholecalciferol (VITAMIN D3) 125 MCG (5000 UT) TABS Take 5,000 Units by mouth daily.   Cyanocobalamin (B-12) 2500 MCG TABS Take 2,500 mcg by mouth daily.   DULoxetine (CYMBALTA) 30 MG capsule Take 30 mg by mouth at bedtime.   DULoxetine (CYMBALTA) 60 MG capsule Take 60 mg by mouth every morning.   ELIQUIS 5 MG TABS tablet TAKE 1 TABLET(5 MG) BY MOUTH TWICE DAILY   EPINEPHrine 0.3 mg/0.3 mL IJ SOAJ injection Inject 0.3 mg into the muscle as needed for anaphylaxis.   furosemide (LASIX) 40 MG tablet Take 1 tablet (40 mg total) by mouth 2 (two) times daily. On M - W - Fitzgerald take a third tablet of Furosemide.   gabapentin (NEURONTIN) 300 MG capsule Take 300 mg by mouth 2 (two) times daily.   Lactobacillus (PROBIOTIC GOLD EXTRA  STRENGTH) CAPS Take 1 tablet by mouth daily.   metoprolol succinate (TOPROL-XL) 50 MG 24 hr tablet Take 100 mg by mouth daily. Take with or immediately following a meal.   naloxone (NARCAN) nasal spray 4 mg/0.1 mL Place 1 spray into the nose as needed for opioid reversal.   nitroGLYCERIN (NITROSTAT) 0.4 MG SL tablet Place 1 tablet (0.4 mg total) under the tongue every 5 (five) minutes as needed for chest pain.   pantoprazole (PROTONIX) 40 MG tablet Take 40 mg by mouth daily.   simvastatin (ZOCOR) 20 MG tablet Take 20 mg by mouth at bedtime.   tamsulosin (FLOMAX) 0.4 MG CAPS capsule Take 0.4 mg by mouth daily.      EKGs/Labs/Other Studies Reviewed:    The following studies were reviewed today:  Cardiac Studies & Procedures       ECHOCARDIOGRAM  ECHOCARDIOGRAM LIMITED 07/31/2021  Narrative ECHOCARDIOGRAM LIMITED REPORT    Patient Name:   David Fitzgerald Date of Exam: 07/31/2021 Medical Rec #:  [id number]      Height:  74.0 in Accession #:    [id number]     Weight:       378.0 lb Date of Birth:  1960/09/04     BSA:          2.849 m Patient Age:    60 years       BP:           120/90 mmHg Patient Gender: M              HR:           113 bpm. Exam Location:    Procedure: Intracardiac Opacification Agent, Limited Echo and Color Doppler  Indications:    Hypertension, unspecified type [I10 (ICD-10-CM)]  History:        Patient has prior history of Echocardiogram examinations, most recent 06/21/2021. CHF, Arrythmias:Atrial Fibrillation; Risk Factors:Hypertension and Dyslipidemia.  Sonographer:    Margreta Journey RDCS Referring Phys: [id number] Baldo Daub   Sonographer Comments: Image acquisition challenging due to patient body habitus and Image acquisition challenging due to respiratory motion. IMPRESSIONS   1. Left ventricular ejection fraction, by estimation, appears to be 40-45%. Contrast was utilized howere in view of A fib, IVCD and suboptimal nature of the  study, this assesment is suboptimal. May need other modalities of imaging. This was a limited study.  FINDINGS Left Ventricle: Left ventricular ejection fraction, by estimation, is 60 to 65%. The left ventricle has normal function. The left ventricle has no regional wall motion abnormalities. Definity contrast agent was given IV to delineate the left ventricular endocardial borders. The left ventricular internal cavity size was normal in size. There is no left ventricular hypertrophy.   Belva Crome MD Electronically signed by Belva Crome MD Signature Date/Time: 07/31/2021/5:36:34 PM    Final    MONITORS  LONG TERM MONITOR (3-14 DAYS) 10/25/2019  Narrative Is a monitor was performed for 7 days beginning 09/28/2019 to assess heart rate response with chronic atrial fibrillation.  Daytime his heart rate profile was 50-110 86% of the time, 2% 1 10-1 50 and less than 1% greater than 150 bpm.  Nighttime his heart rate profile was 50-110 87% of the time, 110 to 152% of the time and greater than 150 less than 1% of the time.  No pauses of 3 seconds or greater.  Regular ectopy was rare with isolated PVCs 2 couplets and one triplet.  Acute event was seen with atrial fibrillation.   Occlusion atrial fibrillation, heart rate control is good.               Recent Labs: 09/16/2022: ALT 10; BUN 10; Creatinine, Ser 1.41; Hemoglobin 13.5; NT-Pro BNP 566; Platelets 335; Potassium 3.7; Sodium 141  Recent Lipid Panel    Component Value Date/Time   CHOL 124 09/16/2022 1513   TRIG 189 (H) 09/16/2022 1513   HDL 26 (L) 09/16/2022 1513   CHOLHDL 4.8 09/16/2022 1513   LDLCALC 66 09/16/2022 1513    Physical Exam:    VS:  BP (!) 150/86   Pulse 77   Ht 6\' 2"  (1.88 m)   Wt (!) 364 lb (165.1 kg)   SpO2 96%   BMI 46.73 kg/m     Wt Readings from Last 3 Encounters:  12/17/22 (!) 364 lb (165.1 kg)  09/16/22 (!) 360 lb 12.8 oz (163.7 kg)  06/07/22 (!) 359 lb (162.8 kg)     GEN: BMI  is in the obese category exceeds 46 well nourished, well developed in no acute distress  HEENT: Normal NECK: No JVD; No carotid bruits LYMPHATICS: No lymphadenopathy CARDIAC: Irregular rate and rhythm variable first heart sound  RESPIRATORY:  Clear to auscultation without rales, wheezing or rhonchi  ABDOMEN: Soft, non-tender, non-distended MUSCULOSKELETAL:  No edema; No deformity  SKIN: Warm and dry NEUROLOGIC:  Alert and oriented x 3 PSYCHIATRIC:  Normal affect    Signed, David Herrlich, MD  12/17/2022 10:23 AM    [facility]

## 2022-12-17 ENCOUNTER — Encounter: Payer: Self-pay | Admitting: Cardiology

## 2022-12-17 ENCOUNTER — Ambulatory Visit: Payer: Medicare HMO | Attending: Cardiology | Admitting: Cardiology

## 2022-12-17 VITALS — BP 150/86 | HR 77 | Ht 74.0 in | Wt 364.0 lb

## 2022-12-17 DIAGNOSIS — I110 Hypertensive heart disease with heart failure: Secondary | ICD-10-CM | POA: Diagnosis not present

## 2022-12-17 DIAGNOSIS — I959 Hypotension, unspecified: Secondary | ICD-10-CM

## 2022-12-17 DIAGNOSIS — I4821 Permanent atrial fibrillation: Secondary | ICD-10-CM

## 2022-12-17 DIAGNOSIS — Z7901 Long term (current) use of anticoagulants: Secondary | ICD-10-CM

## 2022-12-17 DIAGNOSIS — I5042 Chronic combined systolic (congestive) and diastolic (congestive) heart failure: Secondary | ICD-10-CM

## 2022-12-17 MED ORDER — APIXABAN 2.5 MG PO TABS: 5.0000 mg | ORAL_TABLET | Freq: Two times a day (BID) | ORAL | Status: DC

## 2022-12-17 NOTE — Addendum Note (Signed)
Addended by: Roxanne Mins I on: 12/17/2022 10:40 AM   Modules accepted: Orders

## 2022-12-17 NOTE — Patient Instructions (Signed)
Medication Instructions:  Your physician recommends that you continue on your current medications as directed. Please refer to the Current Medication list given to you today.  *If you need a refill on your cardiac medications before your next appointment, please call your pharmacy*   Lab Work: None If you have labs (blood work) drawn today and your tests are completely normal, you will receive your results only by: MyChart Message (if you have MyChart) OR A paper copy in the mail If you have any lab test that is abnormal or we need to change your treatment, we will call you to review the results.   Testing/Procedures: None   Follow-Up: At [facility], you and your health needs are our priority.  As part of our continuing mission to provide you with exceptional heart care, we have created designated Provider Care Teams.  These Care Teams include your primary Cardiologist (physician) and Advanced Practice Providers (APPs -  Physician Assistants and Nurse Practitioners) who all work together to provide you with the care you need, when you need it.  We recommend signing up for the patient portal called "MyChart".  Sign up information is provided on this After Visit Summary.  MyChart is used to connect with patients for Virtual Visits (Telemedicine).  Patients are able to view lab/test results, encounter notes, upcoming appointments, etc.  Non-urgent messages can be sent to your provider as well.   To learn more about what you can do with MyChart, go to [email or web address].    Your next appointment:   9 month(s)  Provider:   Brian Munley, MD    Other Instructions None  

## 2022-12-25 ENCOUNTER — Telehealth: Payer: Self-pay | Admitting: Orthopedic Surgery

## 2022-12-25 NOTE — Telephone Encounter (Signed)
Can you please call this pt and make an appt with either Erin or Dr. Lajoyce Corners next available please? This pt has not been seen by Dr. Lajoyce Corners since 2022 and insurance will require a face to face visit in order to honor our rx. Thanks!

## 2022-12-25 NOTE — Telephone Encounter (Signed)
Patient came into office requesting a prescription sent to [facility] for a prosthetic sleeve for left leg. David Fitzgerald

## 2022-12-27 DIAGNOSIS — E1143 Type 2 diabetes mellitus with diabetic autonomic (poly)neuropathy: Secondary | ICD-10-CM | POA: Diagnosis not present

## 2022-12-27 DIAGNOSIS — Z23 Encounter for immunization: Secondary | ICD-10-CM | POA: Diagnosis not present

## 2022-12-27 DIAGNOSIS — I739 Peripheral vascular disease, unspecified: Secondary | ICD-10-CM | POA: Diagnosis not present

## 2022-12-27 DIAGNOSIS — I10 Essential (primary) hypertension: Secondary | ICD-10-CM | POA: Diagnosis not present

## 2022-12-31 DIAGNOSIS — Z1389 Encounter for screening for other disorder: Secondary | ICD-10-CM | POA: Diagnosis not present

## 2022-12-31 DIAGNOSIS — M1612 Unilateral primary osteoarthritis, left hip: Secondary | ICD-10-CM | POA: Diagnosis not present

## 2022-12-31 DIAGNOSIS — M25552 Pain in left hip: Secondary | ICD-10-CM | POA: Diagnosis not present

## 2022-12-31 DIAGNOSIS — M47816 Spondylosis without myelopathy or radiculopathy, lumbar region: Secondary | ICD-10-CM | POA: Diagnosis not present

## 2022-12-31 DIAGNOSIS — Z79891 Long term (current) use of opiate analgesic: Secondary | ICD-10-CM | POA: Diagnosis not present

## 2022-12-31 DIAGNOSIS — G894 Chronic pain syndrome: Secondary | ICD-10-CM | POA: Diagnosis not present

## 2022-12-31 DIAGNOSIS — Z79899 Other long term (current) drug therapy: Secondary | ICD-10-CM | POA: Diagnosis not present

## 2022-12-31 DIAGNOSIS — M47818 Spondylosis without myelopathy or radiculopathy, sacral and sacrococcygeal region: Secondary | ICD-10-CM | POA: Diagnosis not present

## 2023-01-09 DIAGNOSIS — N2889 Other specified disorders of kidney and ureter: Secondary | ICD-10-CM | POA: Diagnosis not present

## 2023-01-09 DIAGNOSIS — N184 Chronic kidney disease, stage 4 (severe): Secondary | ICD-10-CM | POA: Diagnosis not present

## 2023-01-09 DIAGNOSIS — I129 Hypertensive chronic kidney disease with stage 1 through stage 4 chronic kidney disease, or unspecified chronic kidney disease: Secondary | ICD-10-CM | POA: Diagnosis not present

## 2023-01-09 DIAGNOSIS — E1122 Type 2 diabetes mellitus with diabetic chronic kidney disease: Secondary | ICD-10-CM | POA: Diagnosis not present

## 2023-01-14 DIAGNOSIS — L820 Inflamed seborrheic keratosis: Secondary | ICD-10-CM | POA: Diagnosis not present

## 2023-01-14 DIAGNOSIS — L578 Other skin changes due to chronic exposure to nonionizing radiation: Secondary | ICD-10-CM | POA: Diagnosis not present

## 2023-01-14 DIAGNOSIS — L720 Epidermal cyst: Secondary | ICD-10-CM | POA: Diagnosis not present

## 2023-01-14 DIAGNOSIS — L219 Seborrheic dermatitis, unspecified: Secondary | ICD-10-CM | POA: Diagnosis not present

## 2023-01-14 DIAGNOSIS — L853 Xerosis cutis: Secondary | ICD-10-CM | POA: Diagnosis not present

## 2023-01-27 ENCOUNTER — Ambulatory Visit: Payer: Medicare HMO

## 2023-02-03 ENCOUNTER — Ambulatory Visit (INDEPENDENT_AMBULATORY_CARE_PROVIDER_SITE_OTHER): Payer: Medicare HMO | Admitting: *Deleted

## 2023-02-03 DIAGNOSIS — T63441D Toxic effect of venom of bees, accidental (unintentional), subsequent encounter: Secondary | ICD-10-CM | POA: Diagnosis not present

## 2023-03-11 DIAGNOSIS — M25552 Pain in left hip: Secondary | ICD-10-CM | POA: Diagnosis not present

## 2023-03-11 DIAGNOSIS — Z79899 Other long term (current) drug therapy: Secondary | ICD-10-CM | POA: Diagnosis not present

## 2023-03-11 DIAGNOSIS — M47818 Spondylosis without myelopathy or radiculopathy, sacral and sacrococcygeal region: Secondary | ICD-10-CM | POA: Diagnosis not present

## 2023-03-11 DIAGNOSIS — G894 Chronic pain syndrome: Secondary | ICD-10-CM | POA: Diagnosis not present

## 2023-03-11 DIAGNOSIS — M47816 Spondylosis without myelopathy or radiculopathy, lumbar region: Secondary | ICD-10-CM | POA: Diagnosis not present

## 2023-03-11 DIAGNOSIS — Z1389 Encounter for screening for other disorder: Secondary | ICD-10-CM | POA: Diagnosis not present

## 2023-03-11 DIAGNOSIS — Z79891 Long term (current) use of opiate analgesic: Secondary | ICD-10-CM | POA: Diagnosis not present

## 2023-03-11 DIAGNOSIS — M1612 Unilateral primary osteoarthritis, left hip: Secondary | ICD-10-CM | POA: Diagnosis not present

## 2023-03-28 DIAGNOSIS — I739 Peripheral vascular disease, unspecified: Secondary | ICD-10-CM | POA: Diagnosis not present

## 2023-03-28 DIAGNOSIS — Z6841 Body Mass Index (BMI) 40.0 and over, adult: Secondary | ICD-10-CM | POA: Diagnosis not present

## 2023-03-28 DIAGNOSIS — N1831 Chronic kidney disease, stage 3a: Secondary | ICD-10-CM | POA: Diagnosis not present

## 2023-03-28 DIAGNOSIS — E1143 Type 2 diabetes mellitus with diabetic autonomic (poly)neuropathy: Secondary | ICD-10-CM | POA: Diagnosis not present

## 2023-03-28 DIAGNOSIS — I10 Essential (primary) hypertension: Secondary | ICD-10-CM | POA: Diagnosis not present

## 2023-03-31 ENCOUNTER — Ambulatory Visit (INDEPENDENT_AMBULATORY_CARE_PROVIDER_SITE_OTHER): Payer: Medicare HMO | Admitting: *Deleted

## 2023-03-31 DIAGNOSIS — T63441D Toxic effect of venom of bees, accidental (unintentional), subsequent encounter: Secondary | ICD-10-CM

## 2023-04-03 DIAGNOSIS — D485 Neoplasm of uncertain behavior of skin: Secondary | ICD-10-CM | POA: Diagnosis not present

## 2023-04-03 DIAGNOSIS — L219 Seborrheic dermatitis, unspecified: Secondary | ICD-10-CM | POA: Diagnosis not present

## 2023-05-26 ENCOUNTER — Ambulatory Visit: Payer: Medicare HMO

## 2023-05-29 ENCOUNTER — Ambulatory Visit (INDEPENDENT_AMBULATORY_CARE_PROVIDER_SITE_OTHER): Payer: Self-pay | Admitting: *Deleted

## 2023-05-29 DIAGNOSIS — T63441D Toxic effect of venom of bees, accidental (unintentional), subsequent encounter: Secondary | ICD-10-CM | POA: Diagnosis not present

## 2023-06-25 DIAGNOSIS — I10 Essential (primary) hypertension: Secondary | ICD-10-CM | POA: Diagnosis not present

## 2023-06-25 DIAGNOSIS — M47816 Spondylosis without myelopathy or radiculopathy, lumbar region: Secondary | ICD-10-CM | POA: Diagnosis not present

## 2023-06-25 DIAGNOSIS — E1143 Type 2 diabetes mellitus with diabetic autonomic (poly)neuropathy: Secondary | ICD-10-CM | POA: Diagnosis not present

## 2023-06-25 DIAGNOSIS — Z6841 Body Mass Index (BMI) 40.0 and over, adult: Secondary | ICD-10-CM | POA: Diagnosis not present

## 2023-06-25 DIAGNOSIS — G894 Chronic pain syndrome: Secondary | ICD-10-CM | POA: Diagnosis not present

## 2023-06-25 DIAGNOSIS — E1165 Type 2 diabetes mellitus with hyperglycemia: Secondary | ICD-10-CM | POA: Diagnosis not present

## 2023-07-11 ENCOUNTER — Telehealth: Payer: Self-pay | Admitting: Orthopedic Surgery

## 2023-07-11 NOTE — Telephone Encounter (Signed)
 I have scheduled pt Monday AM at 10.

## 2023-07-11 NOTE — Telephone Encounter (Signed)
 Pt stated he's having issues with his prosthetic. He stated its going to deep in his leg creating bleeding and sores that started off at a a small sore.

## 2023-07-14 ENCOUNTER — Ambulatory Visit (INDEPENDENT_AMBULATORY_CARE_PROVIDER_SITE_OTHER): Admitting: Family

## 2023-07-14 ENCOUNTER — Encounter: Payer: Self-pay | Admitting: Family

## 2023-07-14 DIAGNOSIS — Z89512 Acquired absence of left leg below knee: Secondary | ICD-10-CM | POA: Diagnosis not present

## 2023-07-14 DIAGNOSIS — S88112A Complete traumatic amputation at level between knee and ankle, left lower leg, initial encounter: Secondary | ICD-10-CM

## 2023-07-14 NOTE — Progress Notes (Signed)
 Office Visit Note   Patient: David Fitzgerald           Date of Birth: 05/28/1960           MRN: [id number] Visit Date: 07/14/2023              Requested by: No referring provider defined for this encounter. PCP: Patient, No Pcp Per  Chief Complaint  Patient presents with   Left Leg - Follow-up    Hx BKA       HPI: The patient is a 63 year old gentleman seen status post remote below-knee amputation on the left.  He is having issues with his current socket discomfort he has an ulcer with fissures.  He reports he is subsiding into his socket and has been so for over a year now  His current prosthesis is his initial prosthesis and is lost over 65 years old his current liner is also worn out and broken down and causing skin irritation  He is also lost over 50 pounds.  There has been significant volume loss to his residual limb  Patient is an existing left transtibial  amputee.  Patient's current comorbidities are not expected to impact the ability to function with the prescribed prosthesis. Patient verbally communicates a strong desire to use a prosthesis. Patient currently requires mobility aids to ambulate without a prosthesis.  Expects not to use mobility aids with a new prosthesis.  Patient is a K3 level ambulator that spends a lot of time walking around on uneven terrain over obstacles, up and down stairs, and ambulates with a variable cadence.     Assessment & Plan: Visit Diagnoses: No diagnosis found.  Plan: Given an order for socket replacement.  He may require prosthesis set up.  He will follow-up with [facility].  Return for any concerns in the future.  Follow-Up Instructions: No follow-ups on file.   Ortho Exam  Patient is alert, oriented, no adenopathy, well-dressed, normal affect, normal respiratory effort. On examination left residual limb this is well consolidated however he does have a half dollar size area of hyperkeratotic tissue with 2 fissures to the left  residual limb from subsiding into his socket there is no surrounding erythema or drainage no sign of infection   Imaging: No results found. No images are attached to the encounter.  Labs: Lab Results  Component Value Date   HGBA1C 6.0 (H) 03/08/2020   HGBA1C 6.0 (H) 02/04/2020   REPTSTATUS 03/15/2020 FINAL 03/10/2020   GRAMSTAIN  03/10/2020    MODERATE WBC PRESENT, PREDOMINANTLY PMN NO ORGANISMS SEEN    CULT  03/10/2020    RARE ENTEROBACTER CLOACAE CRITICAL RESULT CALLED TO, READ BACK BY AND VERIFIED WITH: RN TAlen Amy [id number] [id number] FCP NO ANAEROBES ISOLATED Performed at [facility], [address] [place], [place] [address]    LABORGA ENTEROBACTER CLOACAE 03/10/2020     Lab Results  Component Value Date   ALBUMIN  4.1 09/16/2022   ALBUMIN  2.7 (L) 03/10/2020   ALBUMIN  2.9 (L) 03/08/2020    Lab Results  Component Value Date   MG 2.1 02/08/2020   MG 2.0 02/04/2020   MG 1.9 02/03/2020   Lab Results  Component Value Date   VD25OH 19.57 (L) 03/08/2020   VD25OH 16.36 (L) 02/16/2020    No results found for: "PREALBUMIN"    Latest Ref Rng & Units 09/16/2022    3:13 PM 03/02/2021   11:12 AM 10/06/2020    2:28 AM  CBC EXTENDED  WBC 3.4 - 10.8 x10E3/uL 10.8  11.0  11.1   RBC 4.14 - 5.80 x10E6/uL 5.21  5.14  4.43   Hemoglobin 13.0 - 17.7 g/dL 28.4  13.2  44.0   HCT 37.5 - 51.0 % 43.1  46.6  38.4   Platelets 150 - 450 x10E3/uL 335  246  331      There is no height or weight on file to calculate BMI.  Orders:  No orders of the defined types were placed in this encounter.  No orders of the defined types were placed in this encounter.    Procedures: No procedures performed  Clinical Data: No additional findings.  ROS:  All other systems negative, except as noted in the HPI. Review of Systems  Objective: Vital Signs: There were no vitals taken for this visit.  Specialty Comments:  No specialty comments available.  PMFS History: Patient Active  Problem List   Diagnosis Date Noted   CHF (congestive heart failure) (HCC) 06/11/2021   Infection following procedure 04/26/2021   Dislocation of left elbow    Instability of left elbow joint 02/22/2021   IBS (irritable bowel syndrome) 12/08/2020   Dyspnea on minimal exertion 12/08/2020   Epigastric pain 12/08/2020   Hematuria 12/08/2020   Nausea & vomiting 12/08/2020   Type II diabetes mellitus, uncontrolled 12/08/2020   Atrial fibrillation with rapid ventricular response (HCC) 12/08/2020   Abscess of bursa of left ankle 10/04/2020   Subacute osteomyelitis, left ankle and foot (HCC)    Abscess of ankle    Asthma 07/31/2020   Depression 07/31/2020   Dysrhythmia 07/31/2020   GERD (gastroesophageal reflux disease) 07/31/2020   History of kidney stones 07/31/2020   Pneumonia 07/31/2020   Left ankle pain 04/05/2020   Open dislocation of left ankle    A-fib (HCC)    OSA (obstructive sleep apnea) 03/09/2020   Wound dehiscence, traumatic injury repair, left medial ankle  03/08/2020   Difficulty in walking, not elsewhere classified 02/17/2020   Muscle weakness (generalized) 02/17/2020   Unspecified lack of coordination 02/17/2020   Vitamin D  deficiency 02/16/2020   Anemia, unspecified 02/16/2020   Long term (current) use of opiate analgesic 02/16/2020   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   Chronic, continuous use of opioids 02/15/2020   Open fracture dislocation of left ankle 01/31/2020   Ankle fracture 01/31/2020   Permanent atrial fibrillation (HCC)    Hypertension    Diabetes (HCC)    Bee sting allergy    Hypotension 12/25/2017   Chronic anticoagulation 03/07/2017   Coronary artery calcification seen on CT scan 02/07/2017   CAD in native artery 02/07/2017   Persistent atrial fibrillation (HCC) 02/06/2017   Anxiety 02/06/2017   Arthritis 02/06/2017   Pernicious anemia 02/06/2017   Allergy to pollen 04/06/2015   OSA treated with BiPAP 12/14/2014   Obesity  hypoventilation syndrome (HCC) 12/14/2014   Morbid obesity with BMI of 40.0-44.9, adult (HCC) 12/14/2014   Hypertensive heart disease 12/14/2014   Hyperlipidemia, mixed 12/14/2014   Type 2 diabetes mellitus with diabetic neuropathy (HCC) 12/14/2014   Chronic venous insufficiency 12/14/2014   Edema 12/14/2014   Morbid obesity (HCC) 12/14/2014   Bell's palsy 02/24/2013   Polyneuropathy in diabetes (HCC) 02/24/2013   Syndrome affecting cervical region 02/24/2013   Trigeminal neuralgia 02/24/2013   Lumbar pain with radiation down left leg 07/09/2012   BMI 45.0-49.9, adult (HCC) 07/09/2012   Chronic pain syndrome 07/09/2012   Morbid obesity with BMI of 45.0-49.9, adult (HCC)  07/09/2012   Pain syndrome, chronic 07/09/2012   Past Medical History:  Diagnosis Date   A-fib (HCC)    Abscess of ankle    Abscess of bursa of left ankle 10/04/2020   Allergy to pollen 04/06/2015   Anemia, unspecified 02/16/2020   Ankle fracture 01/31/2020   Anxiety 02/06/2017   Arthritis 02/06/2017   Asthma    as a child   Atrial fibrillation with rapid ventricular response (HCC) 12/08/2020   Bee sting allergy    wasp / mixed vespid   Bell's palsy 02/24/2013   BMI 45.0-49.9, adult (HCC) 07/09/2012   CAD in native artery 02/07/2017   CHF (congestive heart failure) (HCC)    Chronic anticoagulation 03/07/2017   Chronic pain syndrome 07/09/2012   Chronic venous insufficiency 12/14/2014   Chronic, continuous use of opioids 02/15/2020   Coronary artery calcification seen on CT scan 02/07/2017   Depression    Diabetes (HCC)    Difficulty in walking, not elsewhere classified 02/17/2020   Dislocation of left elbow    Dyspnea on minimal exertion 12/08/2020   Dysrhythmia    afib   Edema 12/14/2014   Epigastric pain 12/08/2020   GERD (gastroesophageal reflux disease)    Hematuria 12/08/2020   History of kidney stones    Hyperlipidemia, mixed 12/14/2014   Hypertension    Hypertensive heart disease  12/14/2014   Hypotension 12/25/2017   IBS (irritable bowel syndrome)    Infection following procedure 04/26/2021   Formatting of this note might be different from the original.  Added automatically from request for surgery [id number]     Instability of left elbow joint 02/22/2021   Left ankle pain 04/05/2020   Long term (current) use of opiate analgesic 02/16/2020   Lumbar pain with radiation down left leg 07/09/2012   Morbid obesity (HCC) 12/14/2014   Morbid obesity with BMI of 40.0-44.9, adult (HCC) 12/14/2014   Morbid obesity with BMI of 45.0-49.9, adult (HCC) 07/09/2012   Muscle weakness (generalized) 02/17/2020   Nausea & vomiting 12/08/2020   Obesity hypoventilation syndrome (HCC) 12/14/2014   Open dislocation of left ankle    Open fracture dislocation of left ankle 01/31/2020   OSA (obstructive sleep apnea) 03/09/2020   doesn't use a Cpap   OSA treated with BiPAP 12/14/2014   Pain syndrome, chronic 07/09/2012   Permanent atrial fibrillation (HCC)    Pernicious anemia 02/06/2017   Persistent atrial fibrillation (HCC) 02/06/2017   CHADS2vasc of 2   Pneumonia    Polyneuropathy in diabetes (HCC) 02/24/2013   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   Subacute osteomyelitis, left ankle and foot (HCC)    Syndrome affecting cervical region 02/24/2013   Trigeminal neuralgia 02/24/2013   Type 2 diabetes mellitus with diabetic neuropathy (HCC) 12/14/2014   Type II diabetes mellitus, uncontrolled 12/08/2020   Unspecified lack of coordination 02/17/2020   Vitamin D  deficiency 02/16/2020   Wound dehiscence, traumatic injury repair, left medial ankle  03/08/2020    Family History  Problem Relation Age of Onset   Breast cancer Mother    Heart disease Father    CAD Father    Atrial fibrillation Father    Stomach cancer Paternal Aunt    Heart disease Paternal Uncle    Breast cancer Maternal Grandmother     Past Surgical History:  Procedure Laterality Date    INTERNAL  FIXATION LEFT TIBIA TO CALCANEUS AND APPLY SKIN GRAFT (Left Ankle)  04/05/2020   ADENOIDECTOMY     AMPUTATION Left 10/04/2020  Procedure: LEFT BELOW KNEE AMPUTATION;  Surgeon: Timothy Ford, MD;  Location: [facility] OR;  Service: Orthopedics;  Laterality: Left;   BACK SURGERY  2002 /2003   CARPAL TUNNEL RELEASE Bilateral    ELBOW LIGAMENT RECONSTRUCTION Left 03/02/2021   Procedure: LEFT ELBOW LATERAL ULNAR COLLATERAL LIGAMENT REPAIR/ POSSIBLE MEDIAL LIGAMENT REPAIR;  Surgeon: Marilyn Shropshire, MD;  Location: [facility] OR;  Service: Orthopedics;  Laterality: Left;   I & D EXTREMITY Left 01/31/2020   Procedure: IRRIGATION AND DEBRIDEMENT ANKLE;  Surgeon: Hardy Lia, MD;  Location: [facility] OR;  Service: Orthopedics;  Laterality: Left;   I & D EXTREMITY Left 03/10/2020   Procedure: DEBRIDEMENT LEFT ANKLE, APPLY SKIN GRAFT;  Surgeon: Timothy Ford, MD;  Location: [facility] OR;  Service: Orthopedics;  Laterality: Left;   KNEE SURGERY Left 2005   ORIF ANKLE FRACTURE Left 01/31/2020   ORIF ANKLE FRACTURE Left 01/31/2020   Procedure: OPEN REDUCTION INTERNAL FIXATION (ORIF) ANKLE FRACTURE;  Surgeon: Hardy Lia, MD;  Location: [facility] OR;  Service: Orthopedics;  Laterality: Left;   ORIF ANKLE FRACTURE Left 04/05/2020   Procedure: INTERNAL FIXATION LEFT TIBIA TO CALCANEUS AND APPLY SKIN GRAFT;  Surgeon: Timothy Ford, MD;  Location: [facility] OR;  Service: Orthopedics;  Laterality: Left;   SHOULDER SURGERY Left 1998   TONSILLECTOMY     Social History   Occupational History   Not on file  Tobacco Use   Smoking status: Former    Current packs/day: 0.00    Average packs/day: 3.0 packs/day for 35.0 years (105.0 ttl pk-yrs)    Types: Cigarettes    Start date: 04/13/1971    Quit date: 04/13/2006    Years since quitting: 17.2   Smokeless tobacco: Never  Vaping Use   Vaping status: Never Used  Substance and Sexual Activity   Alcohol use: No    Alcohol/week: 0.0 standard drinks of alcohol   Drug use: No   Sexual activity: Not on  file

## 2023-07-15 DIAGNOSIS — N2889 Other specified disorders of kidney and ureter: Secondary | ICD-10-CM | POA: Diagnosis not present

## 2023-07-15 DIAGNOSIS — N184 Chronic kidney disease, stage 4 (severe): Secondary | ICD-10-CM | POA: Diagnosis not present

## 2023-07-15 DIAGNOSIS — E1122 Type 2 diabetes mellitus with diabetic chronic kidney disease: Secondary | ICD-10-CM | POA: Diagnosis not present

## 2023-07-15 DIAGNOSIS — I129 Hypertensive chronic kidney disease with stage 1 through stage 4 chronic kidney disease, or unspecified chronic kidney disease: Secondary | ICD-10-CM | POA: Diagnosis not present

## 2023-07-17 DIAGNOSIS — E1165 Type 2 diabetes mellitus with hyperglycemia: Secondary | ICD-10-CM | POA: Diagnosis not present

## 2023-07-17 DIAGNOSIS — I10 Essential (primary) hypertension: Secondary | ICD-10-CM | POA: Diagnosis not present

## 2023-07-17 DIAGNOSIS — Z6841 Body Mass Index (BMI) 40.0 and over, adult: Secondary | ICD-10-CM | POA: Diagnosis not present

## 2023-07-17 DIAGNOSIS — E1143 Type 2 diabetes mellitus with diabetic autonomic (poly)neuropathy: Secondary | ICD-10-CM | POA: Diagnosis not present

## 2023-07-17 LAB — LAB REPORT - SCANNED
Albumin, Urine POC: 4.1
Creatinine, POC: 1.4 mg/dL
PTH: 42

## 2023-07-24 ENCOUNTER — Ambulatory Visit (INDEPENDENT_AMBULATORY_CARE_PROVIDER_SITE_OTHER): Admitting: *Deleted

## 2023-07-24 DIAGNOSIS — T63441D Toxic effect of venom of bees, accidental (unintentional), subsequent encounter: Secondary | ICD-10-CM | POA: Diagnosis not present

## 2023-08-02 ENCOUNTER — Other Ambulatory Visit: Payer: Self-pay | Admitting: Cardiology

## 2023-08-02 DIAGNOSIS — I4819 Other persistent atrial fibrillation: Secondary | ICD-10-CM

## 2023-08-04 DIAGNOSIS — E1165 Type 2 diabetes mellitus with hyperglycemia: Secondary | ICD-10-CM | POA: Diagnosis not present

## 2023-08-04 NOTE — Telephone Encounter (Signed)
 Prescription refill request for Eliquis  received. Indication: AF Last office visit: 12/17/22  B Munley Scr: 1.41 on 09/16/22  Epic Age: 63 Weight: 165.1kg  Based on above findings Eliquis  5mg  twice daily is the appropriate dose.  Refill approved.

## 2023-08-16 ENCOUNTER — Other Ambulatory Visit: Payer: Self-pay | Admitting: Cardiology

## 2023-08-26 DIAGNOSIS — Z89512 Acquired absence of left leg below knee: Secondary | ICD-10-CM | POA: Diagnosis not present

## 2023-09-18 ENCOUNTER — Ambulatory Visit (INDEPENDENT_AMBULATORY_CARE_PROVIDER_SITE_OTHER): Admitting: *Deleted

## 2023-09-18 DIAGNOSIS — T63441D Toxic effect of venom of bees, accidental (unintentional), subsequent encounter: Secondary | ICD-10-CM

## 2023-09-19 DIAGNOSIS — E1165 Type 2 diabetes mellitus with hyperglycemia: Secondary | ICD-10-CM | POA: Diagnosis not present

## 2023-09-23 DIAGNOSIS — M47816 Spondylosis without myelopathy or radiculopathy, lumbar region: Secondary | ICD-10-CM | POA: Diagnosis not present

## 2023-09-23 DIAGNOSIS — Z79891 Long term (current) use of opiate analgesic: Secondary | ICD-10-CM | POA: Diagnosis not present

## 2023-09-26 DIAGNOSIS — E1140 Type 2 diabetes mellitus with diabetic neuropathy, unspecified: Secondary | ICD-10-CM | POA: Diagnosis not present

## 2023-09-26 DIAGNOSIS — E1143 Type 2 diabetes mellitus with diabetic autonomic (poly)neuropathy: Secondary | ICD-10-CM | POA: Diagnosis not present

## 2023-09-26 DIAGNOSIS — Z89512 Acquired absence of left leg below knee: Secondary | ICD-10-CM | POA: Diagnosis not present

## 2023-09-26 DIAGNOSIS — Z6841 Body Mass Index (BMI) 40.0 and over, adult: Secondary | ICD-10-CM | POA: Diagnosis not present

## 2023-09-26 DIAGNOSIS — E1165 Type 2 diabetes mellitus with hyperglycemia: Secondary | ICD-10-CM | POA: Diagnosis not present

## 2023-09-26 DIAGNOSIS — I10 Essential (primary) hypertension: Secondary | ICD-10-CM | POA: Diagnosis not present

## 2023-11-13 ENCOUNTER — Ambulatory Visit

## 2023-11-13 DIAGNOSIS — T63441D Toxic effect of venom of bees, accidental (unintentional), subsequent encounter: Secondary | ICD-10-CM

## 2023-11-13 NOTE — Progress Notes (Signed)
 BRETON BERNS                                          MRN: [id number]   11/13/2023   The [facility] Quality Team Specialist reviewed this patient medical record for the purposes of chart review for care gap closure. The following were reviewed: chart review for care gap closure-glycemic status assessment.    [facility] Quality Team

## 2024-01-08 ENCOUNTER — Ambulatory Visit: Admitting: *Deleted

## 2024-01-08 DIAGNOSIS — T63441D Toxic effect of venom of bees, accidental (unintentional), subsequent encounter: Secondary | ICD-10-CM | POA: Diagnosis not present

## 2024-01-11 ENCOUNTER — Other Ambulatory Visit: Payer: Self-pay | Admitting: Cardiology

## 2024-01-13 ENCOUNTER — Ambulatory Visit: Admitting: Orthopedic Surgery

## 2024-01-14 DIAGNOSIS — E1122 Type 2 diabetes mellitus with diabetic chronic kidney disease: Secondary | ICD-10-CM | POA: Diagnosis not present

## 2024-01-14 DIAGNOSIS — N1831 Chronic kidney disease, stage 3a: Secondary | ICD-10-CM | POA: Diagnosis not present

## 2024-01-14 DIAGNOSIS — I129 Hypertensive chronic kidney disease with stage 1 through stage 4 chronic kidney disease, or unspecified chronic kidney disease: Secondary | ICD-10-CM | POA: Diagnosis not present

## 2024-01-14 DIAGNOSIS — N2889 Other specified disorders of kidney and ureter: Secondary | ICD-10-CM | POA: Diagnosis not present

## 2024-01-15 ENCOUNTER — Encounter: Payer: Self-pay | Admitting: Orthopedic Surgery

## 2024-01-15 NOTE — Progress Notes (Signed)
 Office Visit Note   Patient: David Fitzgerald           Date of Birth: 1960/03/31           MRN: [id number] Visit Date: 01/13/2024              Requested by: No referring provider defined for this encounter. PCP: Patient, No Pcp Per  Chief Complaint  Patient presents with   Left Leg - Wound Check      HPI: Discussed the use of AI scribe software for clinical note transcription with the patient, who gave verbal consent to proceed.  History of Present Illness EVO ADERMAN is a 63 year old male with diabetes who presents with issues related to prosthetic socket fit and pressure ulcer development.  He has been experiencing issues with his new prosthetic socket for approximately two months. The socket is too large, causing him to slide down into it and develop a pressure sore. He uses pads and wears a three to five ply sock, along with an additional two to three ply sock, to improve the fit. Despite these measures, he continues to experience issues with the socket fit.  He is concerned about the development of a pressure ulcer, especially given his diabetic status. The ulcer is described as a pressure sore, and he is worried about it worsening. He applies antibiotic ointment and uses large Band-Aids to manage the sore. Additionally, he uses a black sock with 'two X' on it, which is medicated and worn directly against the skin to aid healing.  He has experienced significant weight loss, having lost 75 pounds and aiming to lose an additional 75 pounds. This weight loss has contributed to the poor fit of his prosthetic socket, as his previous socket was also too large even with a liner.  He discusses a past elbow fracture that required multiple surgical interventions. Initially treated with an external fixator, he later underwent surgery at [facility], where a chief orthopedic doctor performed additional procedures. Despite these efforts, he still experiences limited strength and mobility in his  arm, with a free-floating bone and a loose bicep muscle. He is currently managing with a hard shell brace.     Assessment & Plan: Visit Diagnoses:  1. Below-knee amputation of left lower extremity, initial encounter [facility])     Plan: Assessment and Plan Assessment & Plan Pressure ulcer of residual limb Pressure ulcer on residual limb, 2 cm, likely from prosthetic socket sliding. No cellulitis, infection, or exposed bone. Diabetes and weight loss increase risk of worsening. - Advised wearing black medicated sock directly on skin. - Recommended applying small amount of antibiotic ointment to ulcer. - Encouraged minimizing weight-bearing on affected leg. - Scheduled follow-up in four weeks to reassess ulcer.  Lower limb amputation with prosthetic fitting New prosthetic socket fitting issues due to weight loss. Socket too large, causing sliding and pressure ulcer. - Continue using current socket with internal liner and pads. - Re-evaluate prosthetic fitting in four weeks.  Status post open reduction and internal fixation of elbow fracture with post-surgical impairment Persistent post-surgical impairment after multiple interventions. No current surgical intervention planned unless symptoms worsen. - Continue to monitor for worsening symptoms or functional impairment.      Follow-Up Instructions: No follow-ups on file.   Ortho Exam  Patient is alert, oriented, no adenopathy, well-dressed, normal affect, normal respiratory effort. Physical Exam EXTREMITIES: Patient is sliding into the socket. SKIN: Ulcer 2 cm in diameter, no cellulitis, no signs  of infection, no exposed bone.      Imaging: No results found. No images are attached to the encounter.  Labs: Lab Results  Component Value Date   HGBA1C 6.0 (H) 03/08/2020   HGBA1C 6.0 (H) 02/04/2020   REPTSTATUS 03/15/2020 FINAL 03/10/2020   GRAMSTAIN  03/10/2020    MODERATE WBC PRESENT, PREDOMINANTLY PMN NO ORGANISMS SEEN     CULT  03/10/2020    RARE ENTEROBACTER CLOACAE CRITICAL RESULT CALLED TO, READ BACK BY AND VERIFIED WITH: RN TSABRA RAVEN 1141 [id number] FCP NO ANAEROBES ISOLATED Performed at [facility], [address], [place], [place] [address]    LABORGA ENTEROBACTER CLOACAE 03/10/2020     Lab Results  Component Value Date   ALBUMIN  4.1 09/16/2022   ALBUMIN  2.7 (L) 03/10/2020   ALBUMIN  2.9 (L) 03/08/2020    Lab Results  Component Value Date   MG 2.1 02/08/2020   MG 2.0 02/04/2020   MG 1.9 02/03/2020   Lab Results  Component Value Date   VD25OH 19.57 (L) 03/08/2020   VD25OH 16.36 (L) 02/16/2020    No results found for: PREALBUMIN    Latest Ref Rng & Units 09/16/2022    3:13 PM 03/02/2021   11:12 AM 10/06/2020    2:28 AM  CBC EXTENDED  WBC 3.4 - 10.8 x10E3/uL 10.8  11.0  11.1   RBC 4.14 - 5.80 x10E6/uL 5.21  5.14  4.43   Hemoglobin 13.0 - 17.7 g/dL 86.4  84.5  88.7   HCT 37.5 - 51.0 % 43.1  46.6  38.4   Platelets 150 - 450 x10E3/uL 335  246  331      There is no height or weight on file to calculate BMI.  Orders:  No orders of the defined types were placed in this encounter.  No orders of the defined types were placed in this encounter.    Procedures: No procedures performed  Clinical Data: No additional findings.  ROS:  All other systems negative, except as noted in the HPI. Review of Systems  Objective: Vital Signs: There were no vitals taken for this visit.  Specialty Comments:  No specialty comments available.  PMFS History: Patient Active Problem List   Diagnosis Date Noted   CHF (congestive heart failure) (HCC) 06/11/2021   Infection following procedure 04/26/2021   Dislocation of left elbow    Instability of left elbow joint 02/22/2021   IBS (irritable bowel syndrome) 12/08/2020   Dyspnea on minimal exertion 12/08/2020   Epigastric pain 12/08/2020   Hematuria 12/08/2020   Nausea & vomiting 12/08/2020   Type II diabetes mellitus, uncontrolled  12/08/2020   Atrial fibrillation with rapid ventricular response (HCC) 12/08/2020   Abscess of bursa of left ankle 10/04/2020   Subacute osteomyelitis, left ankle and foot (HCC)    Abscess of ankle    Asthma 07/31/2020   Depression 07/31/2020   Dysrhythmia 07/31/2020   GERD (gastroesophageal reflux disease) 07/31/2020   History of kidney stones 07/31/2020   Pneumonia 07/31/2020   Left ankle pain 04/05/2020   Open dislocation of left ankle    A-fib (HCC)    OSA (obstructive sleep apnea) 03/09/2020   Wound dehiscence, traumatic injury repair, left medial ankle  03/08/2020   Difficulty in walking, not elsewhere classified 02/17/2020   Muscle weakness (generalized) 02/17/2020   Unspecified lack of coordination 02/17/2020   Vitamin D  deficiency 02/16/2020   Anemia, unspecified 02/16/2020   Long term (current) use of opiate analgesic 02/16/2020   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/16/2020   Chronic, continuous use of opioids 02/15/2020   Open fracture dislocation of left ankle 01/31/2020   Ankle fracture 01/31/2020   Permanent atrial fibrillation (HCC)    Hypertension    Diabetes (HCC)    Bee sting allergy    Hypotension 12/25/2017   Chronic anticoagulation 03/07/2017   Coronary artery calcification seen on CT scan 02/07/2017   CAD in native artery 02/07/2017   Persistent atrial fibrillation (HCC) 02/06/2017   Anxiety 02/06/2017   Arthritis 02/06/2017   Pernicious anemia 02/06/2017   Allergy to pollen 04/06/2015   OSA treated with BiPAP 12/14/2014   Obesity hypoventilation syndrome (HCC) 12/14/2014   Morbid obesity with BMI of 40.0-44.9, adult (HCC) 12/14/2014   Hypertensive heart disease 12/14/2014   Hyperlipidemia, mixed 12/14/2014   Type 2 diabetes mellitus with diabetic neuropathy (HCC) 12/14/2014   Chronic venous insufficiency 12/14/2014   Edema 12/14/2014   Morbid obesity (HCC) 12/14/2014   Bell's palsy 02/24/2013   Polyneuropathy in diabetes (HCC) 02/24/2013    Syndrome affecting cervical region 02/24/2013   Trigeminal neuralgia 02/24/2013   Lumbar pain with radiation down left leg 07/09/2012   BMI 45.0-49.9, adult (HCC) 07/09/2012   Chronic pain syndrome 07/09/2012   Morbid obesity with BMI of 45.0-49.9, adult (HCC) 07/09/2012   Pain syndrome, chronic 07/09/2012   Past Medical History:  Diagnosis Date   A-fib (HCC)    Abscess of ankle    Abscess of bursa of left ankle 10/04/2020   Allergy to pollen 04/06/2015   Anemia, unspecified 02/16/2020   Ankle fracture 01/31/2020   Anxiety 02/06/2017   Arthritis 02/06/2017   Asthma    as a child   Atrial fibrillation with rapid ventricular response (HCC) 12/08/2020   Bee sting allergy    wasp / mixed vespid   Bell's palsy 02/24/2013   BMI 45.0-49.9, adult (HCC) 07/09/2012   CAD in native artery 02/07/2017   CHF (congestive heart failure) (HCC)    Chronic anticoagulation 03/07/2017   Chronic pain syndrome 07/09/2012   Chronic venous insufficiency 12/14/2014   Chronic, continuous use of opioids 02/15/2020   Coronary artery calcification seen on CT scan 02/07/2017   Depression    Diabetes (HCC)    Difficulty in walking, not elsewhere classified 02/17/2020   Dislocation of left elbow    Dyspnea on minimal exertion 12/08/2020   Dysrhythmia    afib   Edema 12/14/2014   Epigastric pain 12/08/2020   GERD (gastroesophageal reflux disease)    Hematuria 12/08/2020   History of kidney stones    Hyperlipidemia, mixed 12/14/2014   Hypertension    Hypertensive heart disease 12/14/2014   Hypotension 12/25/2017   IBS (irritable bowel syndrome)    Infection following procedure 04/26/2021   Formatting of this note might be different from the original.  Added automatically from request for surgery [id number]     Instability of left elbow joint 02/22/2021   Left ankle pain 04/05/2020   Long term (current) use of opiate analgesic 02/16/2020   Lumbar pain with radiation down left leg 07/09/2012    Morbid obesity (HCC) 12/14/2014   Morbid obesity with BMI of 40.0-44.9, adult (HCC) 12/14/2014   Morbid obesity with BMI of 45.0-49.9, adult (HCC) 07/09/2012   Muscle weakness (generalized) 02/17/2020   Nausea & vomiting 12/08/2020   Obesity hypoventilation syndrome (HCC) 12/14/2014   Open dislocation of left ankle    Open fracture dislocation of left ankle 01/31/2020   OSA (obstructive sleep apnea)  03/09/2020   doesn't use a Cpap   OSA treated with BiPAP 12/14/2014   Pain syndrome, chronic 07/09/2012   Permanent atrial fibrillation (HCC)    Pernicious anemia 02/06/2017   Persistent atrial fibrillation (HCC) 02/06/2017   CHADS2vasc of 2   Pneumonia    Polyneuropathy in diabetes (HCC) 02/24/2013   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   Subacute osteomyelitis, left ankle and foot (HCC)    Syndrome affecting cervical region 02/24/2013   Trigeminal neuralgia 02/24/2013   Type 2 diabetes mellitus with diabetic neuropathy (HCC) 12/14/2014   Type II diabetes mellitus, uncontrolled 12/08/2020   Unspecified lack of coordination 02/17/2020   Vitamin D  deficiency 02/16/2020   Wound dehiscence, traumatic injury repair, left medial ankle  03/08/2020    Family History  Problem Relation Age of Onset   Breast cancer Mother    Heart disease Father    CAD Father    Atrial fibrillation Father    Stomach cancer Paternal Aunt    Heart disease Paternal Uncle    Breast cancer Maternal Grandmother     Past Surgical History:  Procedure Laterality Date    INTERNAL FIXATION LEFT TIBIA TO CALCANEUS AND APPLY SKIN GRAFT (Left Ankle)  04/05/2020   ADENOIDECTOMY     AMPUTATION Left 10/04/2020   Procedure: LEFT BELOW KNEE AMPUTATION;  Surgeon: Harden Jerona GAILS, MD;  Location: [facility] OR;  Service: Orthopedics;  Laterality: Left;   BACK SURGERY  2002 /2003   CARPAL TUNNEL RELEASE Bilateral    ELBOW LIGAMENT RECONSTRUCTION Left 03/02/2021   Procedure: LEFT ELBOW LATERAL ULNAR COLLATERAL LIGAMENT  REPAIR/ POSSIBLE MEDIAL LIGAMENT REPAIR;  Surgeon: Romona Harari, MD;  Location: [facility] OR;  Service: Orthopedics;  Laterality: Left;   I & D EXTREMITY Left 01/31/2020   Procedure: IRRIGATION AND DEBRIDEMENT ANKLE;  Surgeon: Celena Sharper, MD;  Location: [facility] OR;  Service: Orthopedics;  Laterality: Left;   I & D EXTREMITY Left 03/10/2020   Procedure: DEBRIDEMENT LEFT ANKLE, APPLY SKIN GRAFT;  Surgeon: Harden Jerona GAILS, MD;  Location: [facility] OR;  Service: Orthopedics;  Laterality: Left;   KNEE SURGERY Left 2005   ORIF ANKLE FRACTURE Left 01/31/2020   ORIF ANKLE FRACTURE Left 01/31/2020   Procedure: OPEN REDUCTION INTERNAL FIXATION (ORIF) ANKLE FRACTURE;  Surgeon: Celena Sharper, MD;  Location: [facility] OR;  Service: Orthopedics;  Laterality: Left;   ORIF ANKLE FRACTURE Left 04/05/2020   Procedure: INTERNAL FIXATION LEFT TIBIA TO CALCANEUS AND APPLY SKIN GRAFT;  Surgeon: Harden Jerona GAILS, MD;  Location: [facility] OR;  Service: Orthopedics;  Laterality: Left;   SHOULDER SURGERY Left 1998   TONSILLECTOMY     Social History   Occupational History   Not on file  Tobacco Use   Smoking status: Former    Current packs/day: 0.00    Average packs/day: 3.0 packs/day for 35.0 years (105.0 ttl pk-yrs)    Types: Cigarettes    Start date: 04/13/1971    Quit date: 04/13/2006    Years since quitting: 17.7   Smokeless tobacco: Never  Vaping Use   Vaping status: Never Used  Substance and Sexual Activity   Alcohol use: No    Alcohol/week: 0.0 standard drinks of alcohol   Drug use: No   Sexual activity: Not on file

## 2024-01-30 ENCOUNTER — Other Ambulatory Visit: Payer: Self-pay | Admitting: Cardiology

## 2024-01-30 DIAGNOSIS — I4819 Other persistent atrial fibrillation: Secondary | ICD-10-CM

## 2024-01-30 NOTE — Telephone Encounter (Signed)
 Prescription refill request for Eliquis  received. Indication:afib Last office visit:needs visit Scr: Age:  Weight: Prescription refilled

## 2024-02-06 LAB — LAB REPORT - SCANNED
A1c: 5.5
Albumin, Urine POC: 86.1
Creatinine, POC: 144.5 mg/dL
EGFR: 59
Microalb Creat Ratio: 60

## 2024-02-10 ENCOUNTER — Ambulatory Visit (INDEPENDENT_AMBULATORY_CARE_PROVIDER_SITE_OTHER): Admitting: Orthopedic Surgery

## 2024-02-10 DIAGNOSIS — Z89512 Acquired absence of left leg below knee: Secondary | ICD-10-CM

## 2024-02-10 DIAGNOSIS — S88112A Complete traumatic amputation at level between knee and ankle, left lower leg, initial encounter: Secondary | ICD-10-CM

## 2024-02-11 ENCOUNTER — Encounter: Payer: Self-pay | Admitting: Orthopedic Surgery

## 2024-02-11 NOTE — Progress Notes (Signed)
 "  Office Visit Note   Patient: David Fitzgerald           Date of Birth: 1960/02/28           MRN: [id number] Visit Date: 02/10/2024              Requested by: No referring provider defined for this encounter. PCP: Patient, No Pcp Per  Chief Complaint  Patient presents with   Left Leg - Wound Check      HPI: Discussed the use of AI scribe software for clinical note transcription with the patient, who gave verbal consent to proceed.  History of Present Illness David Fitzgerald is a 64 year old male with left transmetatarsal amputation who presents for evaluation of chronic ulceration and mechanical complications of his left lower limb prosthesis.  He has persistent pain and soreness localized to the left transmetatarsal amputation stump, which has progressively worsened, particularly at night and after prosthesis removal. The pain is severe, characterized by burning and stinging sensations, and is similar to neuropathic symptoms previously experienced in the contralateral foot. He reports intermittent bleeding from the ulcerated area and significant sleep disturbance due to discomfort.  He received a new prosthetic socket less than three months ago, but continues to experience poor fit, including sliding, pistoning, and audible noise during ambulation. He finds the noise socially distressing and notes deterioration in gait, describing increased limping and reduced ambulatory capacity. Attempts to improve socket fit with additional washers and extra ply socks have been unsuccessful.  He has had substantial loss of residual limb volume and lost seventy-five pounds prior to receiving the current socket. Use of a tumescent machine provides temporary relief from burning and stinging symptoms, improving sleep quality. He has been prescribed aspirin, though he has not been ambulating extensively.  He expresses frustration with the prosthetic process and financial stress related to the outstanding  balance for the current socket. Recent lab results show a reduction in A1c and improved renal function.     Assessment & Plan: Visit Diagnoses:  1. 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 - Prescribed new socket, liner, and supplies through [facility]. - Scheduled follow-up in four weeks.  Mechanical complication of left lower limb prosthesis Poor socket fit and excessive movement due to limb volume loss causing pain and functional limitations. - Prescribed new socket and liner. - Advised follow-up with [facility] for socket replacement. - Scheduled follow-up in four weeks.  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 - Scheduled follow-up in four weeks to reassess limb volume and prosthetic needs.      Follow-Up Instructions: Return in about 4 weeks (around 03/09/2024).   Ortho Exam  Patient is alert, oriented, no adenopathy, well-dressed, normal affect, normal respiratory effort. Physical Exam EXTREMITIES: End bearing ulcer on left transmetatarsal amputation with significant loss of residual volume.   Patient is an existing left transtibial  amputee.  Patient's current comorbidities are not expected to impact the ability to function with the prescribed prosthesis. Patient verbally communicates a strong desire to use a prosthesis. Patient currently requires mobility aids to ambulate without a prosthesis.  Expects not to use mobility aids with a new prosthesis. Patient is expected to resume or reach  their K Level within 6 months. Patient was active before the amputation and independent with stairs, uneven terrain, varying cadence, and a community  ambulator.  Patient is a K2 level ambulator that will use a prosthesis to walk around their home and the community over low level environmental barriers.       Imaging: No results found. No images are attached to the encounter.  Labs: Lab Results  Component Value Date   HGBA1C 6.0 (H) 03/08/2020   HGBA1C 6.0 (H) 02/04/2020   REPTSTATUS 03/15/2020 FINAL 03/10/2020   GRAMSTAIN  03/10/2020    MODERATE WBC PRESENT, PREDOMINANTLY PMN NO ORGANISMS SEEN    CULT  03/10/2020    RARE ENTEROBACTER CLOACAE CRITICAL RESULT CALLED TO, READ BACK BY AND VERIFIED WITH: RN TSABRA RAVEN 1141 [id number] FCP NO ANAEROBES ISOLATED Performed at [facility], [address] [place], [place] [address]    LABORGA ENTEROBACTER CLOACAE 03/10/2020     Lab Results  Component Value Date   ALBUMIN  4.1 09/16/2022   ALBUMIN  2.7 (L) 03/10/2020   ALBUMIN  2.9 (L) 03/08/2020    Lab Results  Component Value Date   MG 2.1 02/08/2020   MG 2.0 02/04/2020   MG 1.9 02/03/2020   Lab Results  Component Value Date   VD25OH 19.57 (L) 03/08/2020   VD25OH 16.36 (L) 02/16/2020    No results found for: PREALBUMIN    Latest Ref Rng & Units 09/16/2022    3:13 PM 03/02/2021   11:12 AM 10/06/2020    2:28 AM  CBC EXTENDED  WBC 3.4 - 10.8 x10E3/uL 10.8  11.0  11.1   RBC 4.14 - 5.80 x10E6/uL 5.21  5.14  4.43   Hemoglobin 13.0 - 17.7 g/dL 86.4  84.5  88.7   HCT 37.5 - 51.0 % 43.1  46.6  38.4   Platelets 150 - 450 x10E3/uL 335  246  331      There is no height or weight on file to calculate BMI.  Orders:  No orders of the defined types were placed in this encounter.  No orders of the defined types were placed in this encounter.    Procedures: No procedures performed  Clinical Data: No additional findings.  ROS:  All other systems negative, except as noted in the HPI. Review of Systems  Objective: Vital Signs: There were no vitals taken for this visit.  Specialty Comments:  No specialty  comments available.  PMFS History: Patient Active Problem List   Diagnosis Date Noted   CHF (congestive heart failure) (HCC) 06/11/2021   Infection following procedure 04/26/2021   Dislocation of left elbow    Instability of left elbow joint 02/22/2021   IBS (irritable bowel syndrome) 12/08/2020   Dyspnea on minimal exertion 12/08/2020   Epigastric pain 12/08/2020   Hematuria 12/08/2020   Nausea & vomiting 12/08/2020   Type II diabetes mellitus, uncontrolled 12/08/2020   Atrial fibrillation with rapid ventricular response (HCC) 12/08/2020   Abscess of bursa of left ankle 10/04/2020   Subacute osteomyelitis, left ankle and foot (HCC)    Abscess of ankle    Asthma 07/31/2020   Depression 07/31/2020   Dysrhythmia 07/31/2020   GERD (gastroesophageal reflux disease) 07/31/2020   History of kidney stones 07/31/2020   Pneumonia 07/31/2020   Left ankle pain 04/05/2020   Open dislocation of left ankle    A-fib (HCC)    OSA (obstructive sleep apnea) 03/09/2020   Wound dehiscence, traumatic injury repair, left medial ankle  03/08/2020  Difficulty in walking, not elsewhere classified 02/17/2020   Muscle weakness (generalized) 02/17/2020   Unspecified lack of coordination 02/17/2020   Vitamin D  deficiency 02/16/2020   Anemia, unspecified 02/16/2020   Long term (current) use of opiate analgesic 02/16/2020   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   Chronic, continuous use of opioids 02/15/2020   Open fracture dislocation of left ankle 01/31/2020   Ankle fracture 01/31/2020   Permanent atrial fibrillation (HCC)    Hypertension    Diabetes (HCC)    Bee sting allergy    Hypotension 12/25/2017   Chronic anticoagulation 03/07/2017   Coronary artery calcification seen on CT scan 02/07/2017   CAD in native artery 02/07/2017   Persistent atrial fibrillation (HCC) 02/06/2017   Anxiety 02/06/2017   Arthritis 02/06/2017   Pernicious anemia 02/06/2017   Allergy to pollen  04/06/2015   OSA treated with BiPAP 12/14/2014   Obesity hypoventilation syndrome (HCC) 12/14/2014   Morbid obesity with BMI of 40.0-44.9, adult (HCC) 12/14/2014   Hypertensive heart disease 12/14/2014   Hyperlipidemia, mixed 12/14/2014   Type 2 diabetes mellitus with diabetic neuropathy (HCC) 12/14/2014   Chronic venous insufficiency 12/14/2014   Edema 12/14/2014   Morbid obesity (HCC) 12/14/2014   Bell's palsy 02/24/2013   Polyneuropathy in diabetes (HCC) 02/24/2013   Syndrome affecting cervical region 02/24/2013   Trigeminal neuralgia 02/24/2013   Lumbar pain with radiation down left leg 07/09/2012   BMI 45.0-49.9, adult (HCC) 07/09/2012   Chronic pain syndrome 07/09/2012   Morbid obesity with BMI of 45.0-49.9, adult (HCC) 07/09/2012   Pain syndrome, chronic 07/09/2012   Past Medical History:  Diagnosis Date   A-fib (HCC)    Abscess of ankle    Abscess of bursa of left ankle 10/04/2020   Allergy to pollen 04/06/2015   Anemia, unspecified 02/16/2020   Ankle fracture 01/31/2020   Anxiety 02/06/2017   Arthritis 02/06/2017   Asthma    as a child   Atrial fibrillation with rapid ventricular response (HCC) 12/08/2020   Bee sting allergy    wasp / mixed vespid   Bell's palsy 02/24/2013   BMI 45.0-49.9, adult (HCC) 07/09/2012   CAD in native artery 02/07/2017   CHF (congestive heart failure) (HCC)    Chronic anticoagulation 03/07/2017   Chronic pain syndrome 07/09/2012   Chronic venous insufficiency 12/14/2014   Chronic, continuous use of opioids 02/15/2020   Coronary artery calcification seen on CT scan 02/07/2017   Depression    Diabetes (HCC)    Difficulty in walking, not elsewhere classified 02/17/2020   Dislocation of left elbow    Dyspnea on minimal exertion 12/08/2020   Dysrhythmia    afib   Edema 12/14/2014   Epigastric pain 12/08/2020   GERD (gastroesophageal reflux disease)    Hematuria 12/08/2020   History of kidney stones    Hyperlipidemia, mixed  12/14/2014   Hypertension    Hypertensive heart disease 12/14/2014   Hypotension 12/25/2017   IBS (irritable bowel syndrome)    Infection following procedure 04/26/2021   Formatting of this note might be different from the original.  Added automatically from request for surgery [id number]     Instability of left elbow joint 02/22/2021   Left ankle pain 04/05/2020   Long term (current) use of opiate analgesic 02/16/2020   Lumbar pain with radiation down left leg 07/09/2012   Morbid obesity (HCC) 12/14/2014   Morbid obesity with BMI of 40.0-44.9, adult (HCC) 12/14/2014   Morbid obesity with BMI of 45.0-49.9, adult (  HCC) 07/09/2012   Muscle weakness (generalized) 02/17/2020   Nausea & vomiting 12/08/2020   Obesity hypoventilation syndrome (HCC) 12/14/2014   Open dislocation of left ankle    Open fracture dislocation of left ankle 01/31/2020   OSA (obstructive sleep apnea) 03/09/2020   doesn't use a Cpap   OSA treated with BiPAP 12/14/2014   Pain syndrome, chronic 07/09/2012   Permanent atrial fibrillation (HCC)    Pernicious anemia 02/06/2017   Persistent atrial fibrillation (HCC) 02/06/2017   CHADS2vasc of 2   Pneumonia    Polyneuropathy in diabetes (HCC) 02/24/2013   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   Subacute osteomyelitis, left ankle and foot (HCC)    Syndrome affecting cervical region 02/24/2013   Trigeminal neuralgia 02/24/2013   Type 2 diabetes mellitus with diabetic neuropathy (HCC) 12/14/2014   Type II diabetes mellitus, uncontrolled 12/08/2020   Unspecified lack of coordination 02/17/2020   Vitamin D  deficiency 02/16/2020   Wound dehiscence, traumatic injury repair, left medial ankle  03/08/2020    Family History  Problem Relation Age of Onset   Breast cancer Mother    Heart disease Father    CAD Father    Atrial fibrillation Father    Stomach cancer Paternal Aunt    Heart disease Paternal Uncle    Breast cancer Maternal Grandmother     Past  Surgical History:  Procedure Laterality Date    INTERNAL FIXATION LEFT TIBIA TO CALCANEUS AND APPLY SKIN GRAFT (Left Ankle)  04/05/2020   ADENOIDECTOMY     AMPUTATION Left 10/04/2020   Procedure: LEFT BELOW KNEE AMPUTATION;  Surgeon: Harden Jerona GAILS, MD;  Location: [facility] OR;  Service: Orthopedics;  Laterality: Left;   BACK SURGERY  2002 /2003   CARPAL TUNNEL RELEASE Bilateral    ELBOW LIGAMENT RECONSTRUCTION Left 03/02/2021   Procedure: LEFT ELBOW LATERAL ULNAR COLLATERAL LIGAMENT REPAIR/ POSSIBLE MEDIAL LIGAMENT REPAIR;  Surgeon: Romona Harari, MD;  Location: [facility] OR;  Service: Orthopedics;  Laterality: Left;   I & D EXTREMITY Left 01/31/2020   Procedure: IRRIGATION AND DEBRIDEMENT ANKLE;  Surgeon: Celena Sharper, MD;  Location: [facility] OR;  Service: Orthopedics;  Laterality: Left;   I & D EXTREMITY Left 03/10/2020   Procedure: DEBRIDEMENT LEFT ANKLE, APPLY SKIN GRAFT;  Surgeon: Harden Jerona GAILS, MD;  Location: [facility] OR;  Service: Orthopedics;  Laterality: Left;   KNEE SURGERY Left 2005   ORIF ANKLE FRACTURE Left 01/31/2020   ORIF ANKLE FRACTURE Left 01/31/2020   Procedure: OPEN REDUCTION INTERNAL FIXATION (ORIF) ANKLE FRACTURE;  Surgeon: Celena Sharper, MD;  Location: [facility] OR;  Service: Orthopedics;  Laterality: Left;   ORIF ANKLE FRACTURE Left 04/05/2020   Procedure: INTERNAL FIXATION LEFT TIBIA TO CALCANEUS AND APPLY SKIN GRAFT;  Surgeon: Harden Jerona GAILS, MD;  Location: [facility] OR;  Service: Orthopedics;  Laterality: Left;   SHOULDER SURGERY Left 1998   TONSILLECTOMY     Social History   Occupational History   Not on file  Tobacco Use   Smoking status: Former    Current packs/day: 0.00    Average packs/day: 3.0 packs/day for 35.0 years (105.0 ttl pk-yrs)    Types: Cigarettes    Start date: 04/13/1971    Quit date: 04/13/2006    Years since quitting: 17.8   Smokeless tobacco: Never  Vaping Use   Vaping status: Never Used  Substance and Sexual Activity   Alcohol use: No    Alcohol/week: 0.0 standard  drinks of alcohol   Drug use: No  Sexual activity: Not on file         "

## 2024-02-12 ENCOUNTER — Other Ambulatory Visit: Payer: Self-pay

## 2024-02-12 DIAGNOSIS — M47816 Spondylosis without myelopathy or radiculopathy, lumbar region: Secondary | ICD-10-CM | POA: Insufficient documentation

## 2024-02-12 NOTE — Progress Notes (Signed)
 " Cardiology Office Note:    Date:  02/13/2024   ID:  David Fitzgerald, DOB 1960-12-27, MRN [id number]  PCP:  Patient, No Pcp Per  Cardiologist:  Redell Leiter, MD    Referring MD: No ref. provider found    ASSESSMENT:    1. Permanent atrial fibrillation (HCC)   2. Chronic anticoagulation   3. Hypertensive heart disease with chronic combined systolic and diastolic congestive heart failure (HCC)   4. Persistent atrial fibrillation (HCC)    PLAN:    In order of problems listed above:  Stable rate controlled chronic atrial fibrillation continue his anticoagulant rate slowing metoprolol  Looks improved he has no fluid overload I suspect the episode they went to the hospital with was orthopnea continue his current loop diuretic ACE inhibitor beta-blocker recheck echo in my office he will require contrast administration and decide if we need to uptitrate to Entresto    Next appointment: 68-month follow-up   Medication Adjustments/Labs and Tests Ordered: Current medicines are reviewed at length with the patient today.  Concerns regarding medicines are outlined above.  Orders Placed This Encounter  Procedures   EKG 12-Lead   No orders of the defined types were placed in this encounter.    History of Present Illness:    David Fitzgerald is a 64 y.o. male with a hx of chronic/permanent atrial fibrillation with long-term anticoagulation hypertensive heart disease with heart failure EF 40 to 45% previous symptomatic hypotension associated with acute kidney injury last seen 12/17/2022.  His last ejection fraction by echocardiogram June 2023 40-45%.  He was seen at [facility] ED 02/02/2024 for palpitation and increased shortness of breath.  He was afebrile heart rate 89 he was hypertensive 198 6/100 and 8 repeat 130/59 oxygen  sat 95% hemoglobin was 15.5 platelets normal 264,000 creatinine 1 opponent was undetectable proBNP was mid range 607 EKG independently reviewed by me shows low voltage  atrial fibrillation with controlled ventricular rate.  Chest x-ray showed no acute findings.  Once final diagnosis was shortness of breath and he was advised to follow-up with his family doctor.  Compliance with diet, lifestyle and medications: Yes  I reviewed the events of his ED visit he woke up in the morning breathless heart was rapid and by the time he got to the hospital he was better There is been no recurrence he is on semaglutide has lost 70 pounds kidney functions improved and feels markedly better Otherwise not having edema shortness of breath chest pain palpitation or syncope Told he had another episode to take a nitroglycerin  tablet We will need to recheck his echocardiogram for cardiomyopathy if worsened consider Entresto  Past Medical History:  Diagnosis Date   A-fib (HCC)    Abscess of ankle    Abscess of bursa of left ankle 10/04/2020   Allergy to pollen 04/06/2015   Anemia, unspecified 02/16/2020   Ankle fracture 01/31/2020   Anxiety 02/06/2017   Arthritis 02/06/2017   Asthma    as a child   Atherosclerosis of coronary artery without angina pectoris 02/16/2020   Atrial fibrillation with rapid ventricular response (HCC) 12/08/2020   Bee sting allergy    wasp / mixed vespid   Bell's palsy 02/24/2013   BMI 45.0-49.9, adult (HCC) 07/09/2012   CAD in native artery 02/07/2017   CHF (congestive heart failure) (HCC)    Chronic anticoagulation 03/07/2017   Chronic pain syndrome 07/09/2012   Chronic venous insufficiency 12/14/2014   Chronic, continuous use of opioids 02/15/2020   Coronary artery  calcification seen on CT scan 02/07/2017   Depression    Diabetes (HCC)    Difficulty in walking, not elsewhere classified 02/17/2020   Dislocation of left elbow    Dyspnea on minimal exertion 12/08/2020   Dysrhythmia    afib   Edema 12/14/2014   Epigastric pain 12/08/2020   Extreme obesity with alveolar hypoventilation (HCC) 02/16/2020   GERD (gastroesophageal reflux  disease)    Hematuria 12/08/2020   History of kidney stones    Hyperlipidemia, mixed 12/14/2014   Hypertension    Hypertensive heart disease 12/14/2014   Hypotension 12/25/2017   IBS (irritable bowel syndrome)    Infection following procedure 04/26/2021   Formatting of this note might be different from the original.  Added automatically from request for surgery [id number]     Instability of left elbow joint 02/22/2021   Left ankle pain 04/05/2020   Long term (current) use of opiate analgesic 02/16/2020   Lumbar pain with radiation down left leg 07/09/2012   Lumbar spondylosis 02/12/2024   Morbid obesity (HCC) 12/14/2014   Morbid obesity with BMI of 40.0-44.9, adult (HCC) 12/14/2014   Morbid obesity with BMI of 45.0-49.9, adult (HCC) 07/09/2012   Muscle weakness (generalized) 02/17/2020   Nausea & vomiting 12/08/2020   Obesity hypoventilation syndrome (HCC) 12/14/2014   Open dislocation of left ankle    Open fracture dislocation of left ankle 01/31/2020   OSA (obstructive sleep apnea) 03/09/2020   doesn't use a Cpap   OSA treated with BiPAP 12/14/2014   Pain syndrome, chronic 07/09/2012   Peripheral venous insufficiency 02/16/2020   Permanent atrial fibrillation (HCC)    Pernicious anemia 02/06/2017   Persistent atrial fibrillation (HCC) 02/06/2017   CHADS2vasc of 2   Pneumonia    Polyneuropathy in diabetes (HCC) 02/24/2013   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   Rheumatoid arthritis (HCC) 02/16/2020   Subacute osteomyelitis, left ankle and foot (HCC)    Syndrome affecting cervical region 02/24/2013   Traumatic dislocation of ankle 02/16/2020   Trigeminal neuralgia 02/24/2013   Type 2 diabetes mellitus with diabetic neuropathy (HCC) 12/14/2014   Type II diabetes mellitus, uncontrolled 12/08/2020   Unspecified lack of coordination 02/17/2020   Vitamin D  deficiency 02/16/2020   Wound dehiscence, traumatic injury repair, left medial ankle  03/08/2020    Current  Medications: Active Medications[1]    EKGs/Labs/Other Studies Reviewed:    The following studies were reviewed today:  Cardiac Studies & Procedures   ______________________________________________________________________________________________     ECHOCARDIOGRAM  ECHOCARDIOGRAM LIMITED 07/31/2021  Narrative ECHOCARDIOGRAM LIMITED REPORT    Patient Name:   David Fitzgerald Date of Exam: 07/31/2021 Medical Rec #:  [id number]      Height:       74.0 in Accession #:    [id number]     Weight:       378.0 lb Date of Birth:  28-Jun-1960     BSA:          2.849 m Patient Age:    60 years       BP:           120/90 mmHg Patient Gender: M              HR:           113 bpm. Exam Location:  [place]  Procedure: Intracardiac Opacification Agent, Limited Echo and Color Doppler  Indications:    Hypertension, unspecified type [I10 (ICD-10-CM)]  History:  Patient has prior history of Echocardiogram examinations, most recent 06/21/2021. CHF, Arrythmias:Atrial Fibrillation; Risk Factors:Hypertension and Dyslipidemia.  Sonographer:    Charlie Jointer RDCS Referring Phys: [id number] REDELL JINNY LEITER   Sonographer Comments: Image acquisition challenging due to patient body habitus and Image acquisition challenging due to respiratory motion. IMPRESSIONS   1. Left ventricular ejection fraction, by estimation, appears to be 40-45%. Contrast was utilized howere in view of A fib, IVCD and suboptimal nature of the study, this assesment is suboptimal. May need other modalities of imaging. This was a limited study.  FINDINGS Left Ventricle: Left ventricular ejection fraction, by estimation, is 60 to 65%. The left ventricle has normal function. The left ventricle has no regional wall motion abnormalities. Definity  contrast agent was given IV to delineate the left ventricular endocardial borders. The left ventricular internal cavity size was normal in size. There is no left ventricular  hypertrophy.   Jennifer Crape MD Electronically signed by Jennifer Crape MD Signature Date/Time: 07/31/2021/5:36:34 PM    Final    MONITORS  LONG TERM MONITOR (3-14 DAYS) 10/25/2019  Narrative Is a monitor was performed for 7 days beginning 09/28/2019 to assess heart rate response with chronic atrial fibrillation.  Daytime his heart rate profile was 50-110 86% of the time, 2% 1 10-1 50 and less than 1% greater than 150 bpm.  Nighttime his heart rate profile was 50-110 87% of the time, 110 to 152% of the time and greater than 150 less than 1% of the time.  No pauses of 3 seconds or greater.  Regular ectopy was rare with isolated PVCs 2 couplets and one triplet.  Acute event was seen with atrial fibrillation.   Occlusion atrial fibrillation, heart rate control is good.       ______________________________________________________________________________________________      EKG Interpretation Date/Time:  Friday February 13 2024 09:21:21 EST Ventricular Rate:  80 PR Interval:    QRS Duration:  110 QT Interval:  394 QTC Calculation: 454 R Axis:   130  Text Interpretation: Atrial fibrillation Low voltage QRS Incomplete right bundle branch block Left posterior fascicular block No significant change since last tracing When compared with ECG of 31-Jan-2020 04:20, Confirmed by Leiter Redell ([id number]) on 02/13/2024 9:30:50 AM   Recent Labs: No results found for requested labs within last 365 days.  Recent Lipid Panel    Component Value Date/Time   CHOL 124 09/16/2022 1513   TRIG 189 (H) 09/16/2022 1513   HDL 26 (L) 09/16/2022 1513   CHOLHDL 4.8 09/16/2022 1513   LDLCALC 66 09/16/2022 1513    Physical Exam:    VS:  BP 130/88   Pulse 80   Ht 6' 2 (1.88 m)   Wt (!) 343 lb (155.6 kg)   SpO2 99%   BMI 44.04 kg/m     Wt Readings from Last 3 Encounters:  02/13/24 (!) 343 lb (155.6 kg)  12/17/22 (!) 364 lb (165.1 kg)  09/16/22 (!) 360 lb 12.8 oz (163.7 kg)     GEN:  Marked improvement in his appearance significant weight loss well nourished, well developed in no acute distress HEENT: Normal NECK: No JVD; No carotid bruits LYMPHATICS: No lymphadenopathy CARDIAC: Irregular rate and rhythm  RESPIRATORY:  Clear to auscultation without rales, wheezing or rhonchi  ABDOMEN: Soft, non-tender, non-distended MUSCULOSKELETAL:  No edema; No deformity  SKIN: Warm and dry NEUROLOGIC:  Alert and oriented x 3 PSYCHIATRIC:  Normal affect    Signed, Redell Leiter, MD  02/13/2024 9:31 AM  [facility]      [1]  Current Meds  Medication Sig   apixaban  (ELIQUIS ) 5 MG TABS tablet Take 1 tablet (5 mg total) by mouth 2 (two) times daily. NEEDS CARDIOLOGY APPT, CALL OFFICE [phone].  THANK YOU   ascorbic acid  (VITAMIN C) 1000 MG tablet Take 1,000 mg by mouth daily.   Cholecalciferol  (VITAMIN D3) 125 MCG (5000 UT) TABS Take 5,000 Units by mouth daily.   Cyanocobalamin  (B-12) 2500 MCG TABS Take 2,500 mcg by mouth daily.   EPINEPHrine  0.3 mg/0.3 mL IJ SOAJ injection Inject 0.3 mg into the muscle as needed for anaphylaxis.   furosemide  (LASIX ) 40 MG tablet TAKE 1 TABLET(40MG  TOTAL) BY MOUTH 2 TIMES DAILY. ON M-W-F TAKE THIRD TABLET OF FUROSEMIDE    gabapentin  (NEURONTIN ) 300 MG capsule Take 300 mg by mouth.   Lactobacillus (PROBIOTIC GOLD EXTRA STRENGTH) CAPS Take 1 tablet by mouth daily.   lisinopril  (ZESTRIL ) 5 MG tablet Take 5 mg by mouth daily.   metoprolol  succinate (TOPROL -XL) 50 MG 24 hr tablet TAKE 3 TABLETS(150 MG) BY MOUTH DAILY   naloxone  (NARCAN ) nasal spray 4 mg/0.1 mL Place 1 spray into the nose as needed for opioid reversal.   nitroGLYCERIN  (NITROSTAT ) 0.4 MG SL tablet Place 1 tablet (0.4 mg total) under the tongue every 5 (five) minutes as needed for chest pain.   OZEMPIC, 1 MG/DOSE, 4 MG/3ML SOPN    pantoprazole  (PROTONIX ) 40 MG tablet Take 40 mg by mouth daily.   simvastatin  (ZOCOR ) 20 MG tablet Take 20 mg by mouth at bedtime.    tamsulosin  (FLOMAX ) 0.4 MG CAPS capsule Take 0.4 mg by mouth daily.   "

## 2024-02-13 ENCOUNTER — Encounter: Payer: Self-pay | Admitting: Cardiology

## 2024-02-13 ENCOUNTER — Ambulatory Visit: Attending: Cardiology | Admitting: Cardiology

## 2024-02-13 VITALS — BP 130/88 | HR 80 | Ht 74.0 in | Wt 343.0 lb

## 2024-02-13 DIAGNOSIS — I5042 Chronic combined systolic (congestive) and diastolic (congestive) heart failure: Secondary | ICD-10-CM | POA: Diagnosis not present

## 2024-02-13 DIAGNOSIS — I4821 Permanent atrial fibrillation: Secondary | ICD-10-CM

## 2024-02-13 DIAGNOSIS — I110 Hypertensive heart disease with heart failure: Secondary | ICD-10-CM

## 2024-02-13 DIAGNOSIS — Z7901 Long term (current) use of anticoagulants: Secondary | ICD-10-CM | POA: Diagnosis not present

## 2024-02-13 DIAGNOSIS — I4819 Other persistent atrial fibrillation: Secondary | ICD-10-CM

## 2024-02-13 MED ORDER — LISINOPRIL 5 MG PO TABS: 5.0000 mg | ORAL_TABLET | Freq: Every day | ORAL | 3 refills | Status: AC

## 2024-02-13 MED ORDER — APIXABAN 5 MG PO TABS: 5.0000 mg | ORAL_TABLET | Freq: Two times a day (BID) | ORAL | 0 refills | Status: DC

## 2024-02-13 MED ORDER — SIMVASTATIN 20 MG PO TABS: 20.0000 mg | ORAL_TABLET | Freq: Every day | ORAL | 3 refills | Status: AC

## 2024-02-13 MED ORDER — METOPROLOL SUCCINATE ER 50 MG PO TB24: 150.0000 mg | ORAL_TABLET | Freq: Every day | ORAL | 3 refills | Status: AC

## 2024-02-13 MED ORDER — FUROSEMIDE 40 MG PO TABS: ORAL_TABLET | ORAL | 3 refills | Status: AC

## 2024-02-13 NOTE — Patient Instructions (Signed)
 Medication Instructions:  Your physician recommends that you continue on your current medications as directed. Please refer to the Current Medication list given to you today.  **If you experience shortness of breath, use your Nitroglycerin .   *If you need a refill on your cardiac medications before your next appointment, please call your pharmacy*   Lab Work: None ordered If you have labs (blood work) drawn today and your tests are completely normal, you will receive your results only by: MyChart Message (if you have MyChart) OR A paper copy in the mail If you have any lab test that is abnormal or we need to change your treatment, we will call you to review the results.  Testing/Procedures: Your physician has requested that you have an echocardiogram. Echocardiography is a painless test that uses sound waves to create images of your heart. It provides your doctor with information about the size and shape of your heart and how well your hearts chambers and valves are working. This procedure takes approximately one hour. There are no restrictions for this procedure. Please do NOT wear cologne, perfume, aftershave, or lotions (deodorant is allowed). Please arrive 15 minutes prior to your appointment time.  Please note: We ask at that you not bring children with you during ultrasound (echo/ vascular) testing. Due to room size and safety concerns, children are not allowed in the ultrasound rooms during exams. Our front office staff cannot provide observation of children in our lobby area while testing is being conducted. An adult accompanying a patient to their appointment will only be allowed in the ultrasound room at the discretion of the ultrasound technician under special circumstances. We apologize for any inconvenience.  Follow-Up: At [facility], you and your health needs are our priority.  As part of our continuing mission to provide you with exceptional heart care, we have created  designated Provider Care Teams.  These Care Teams include your primary Cardiologist (physician) and Advanced Practice Providers (APPs -  Physician Assistants and Nurse Practitioners) who all work together to provide you with the care you need, when you need it.  We recommend signing up for the patient portal called MyChart.  Sign up information is provided on this After Visit Summary.  MyChart is used to connect with patients for Virtual Visits (Telemedicine).  Patients are able to view lab/test results, encounter notes, upcoming appointments, etc.  Non-urgent messages can be sent to your provider as well.   To learn more about what you can do with MyChart, go to [email or web address].    Your next appointment:   6 month(s)  The format for your next appointment:   In Person  Provider:   Redell Leiter, MD   Other Instructions Echocardiogram An echocardiogram is a test that uses sound waves (ultrasound) to produce images of the heart. Images from an echocardiogram can provide important information about: Heart size and shape. The size and thickness and movement of your heart's walls. Heart muscle function and strength. Heart valve function or if you have stenosis. Stenosis is when the heart valves are too narrow. If blood is flowing backward through the heart valves (regurgitation). A tumor or infectious growth around the heart valves. Areas of heart muscle that are not working well because of poor blood flow or injury from a heart attack. Aneurysm detection. An aneurysm is a weak or damaged part of an artery wall. The wall bulges out from the normal force of blood pumping through the body. Tell a health care provider about: Any  allergies you have. All medicines you are taking, including vitamins, herbs, eye drops, creams, and over-the-counter medicines. Any blood disorders you have. Any surgeries you have had. Any medical conditions you have. Whether you are pregnant or may be  pregnant. What are the risks? Generally, this is a safe test. However, problems may occur, including an allergic reaction to dye (contrast) that may be used during the test. What happens before the test? No specific preparation is needed. You may eat and drink normally. What happens during the test? You will take off your clothes from the waist up and put on a hospital gown. Electrodes or electrocardiogram (ECG)patches may be placed on your chest. The electrodes or patches are then connected to a device that monitors your heart rate and rhythm. You will lie down on a table for an ultrasound exam. A gel will be applied to your chest to help sound waves pass through your skin. A handheld device, called a transducer, will be pressed against your chest and moved over your heart. The transducer produces sound waves that travel to your heart and bounce back (or echo back) to the transducer. These sound waves will be captured in real-time and changed into images of your heart that can be viewed on a video monitor. The images will be recorded on a computer and reviewed by your health care provider. You may be asked to change positions or hold your breath for a short time. This makes it easier to get different views or better views of your heart. In some cases, you may receive contrast through an IV in one of your veins. This can improve the quality of the pictures from your heart. The procedure may vary among health care providers and hospitals.   What can I expect after the test? You may return to your normal, everyday life, including diet, activities, and medicines, unless your health care provider tells you not to do that. Follow these instructions at home: It is up to you to get the results of your test. Ask your health care provider, or the department that is doing the test, when your results will be ready. Keep all follow-up visits. This is important. Summary An echocardiogram is a test that uses  sound waves (ultrasound) to produce images of the heart. Images from an echocardiogram can provide important information about the size and shape of your heart, heart muscle function, heart valve function, and other possible heart problems. You do not need to do anything to prepare before this test. You may eat and drink normally. After the echocardiogram is completed, you may return to your normal, everyday life, unless your health care provider tells you not to do that. This information is not intended to replace advice given to you by your health care provider. Make sure you discuss any questions you have with your health care provider. Document Revised: 09/14/2019 Document Reviewed: 09/14/2019 Elsevier Patient Education  2021 [employer]   Important Information About Sugar

## 2024-03-04 ENCOUNTER — Ambulatory Visit

## 2024-03-04 NOTE — Progress Notes (Signed)
 [name]                                          MRN: [id number]   03/04/2024   The [facility] Quality Team Specialist reviewed this patient medical record for the purposes of chart review for care gap closure. The following were reviewed: chart review for care gap closure-controlling blood pressure.    [facility]I Quality Team

## 2024-03-09 ENCOUNTER — Ambulatory Visit: Admitting: Orthopedic Surgery

## 2024-03-10 ENCOUNTER — Ambulatory Visit

## 2024-03-11 ENCOUNTER — Other Ambulatory Visit: Payer: Self-pay | Admitting: Cardiology

## 2024-03-11 ENCOUNTER — Ambulatory Visit: Admitting: *Deleted

## 2024-03-11 DIAGNOSIS — I4819 Other persistent atrial fibrillation: Secondary | ICD-10-CM

## 2024-03-11 DIAGNOSIS — T63441D Toxic effect of venom of bees, accidental (unintentional), subsequent encounter: Secondary | ICD-10-CM

## 2024-03-15 ENCOUNTER — Ambulatory Visit: Admitting: Orthopedic Surgery

## 2024-04-01 ENCOUNTER — Ambulatory Visit

## 2024-05-06 ENCOUNTER — Ambulatory Visit
